# Patient Record
Sex: Male | Born: 1965 | Race: White | Hispanic: Yes | Marital: Married | Smoking: Former smoker
Health system: Southern US, Community
[De-identification: ages and names within clinical notes are randomized; demographics above are authoritative.]

## PROBLEM LIST (undated history)

## (undated) DIAGNOSIS — I10 Essential (primary) hypertension: Secondary | ICD-10-CM

## (undated) DIAGNOSIS — E7800 Pure hypercholesterolemia, unspecified: Secondary | ICD-10-CM

## (undated) DIAGNOSIS — M1990 Unspecified osteoarthritis, unspecified site: Secondary | ICD-10-CM

## (undated) DIAGNOSIS — F1010 Alcohol abuse, uncomplicated: Secondary | ICD-10-CM

## (undated) DIAGNOSIS — K219 Gastro-esophageal reflux disease without esophagitis: Secondary | ICD-10-CM

## (undated) DIAGNOSIS — G8929 Other chronic pain: Secondary | ICD-10-CM

## (undated) DIAGNOSIS — R42 Dizziness and giddiness: Secondary | ICD-10-CM

## (undated) DIAGNOSIS — R7303 Prediabetes: Secondary | ICD-10-CM

## (undated) DIAGNOSIS — F419 Anxiety disorder, unspecified: Secondary | ICD-10-CM

## (undated) DIAGNOSIS — F1410 Cocaine abuse, uncomplicated: Secondary | ICD-10-CM

## (undated) DIAGNOSIS — M542 Cervicalgia: Secondary | ICD-10-CM

## (undated) HISTORY — DX: Prediabetes: R73.03

## (undated) HISTORY — PX: EYE SURGERY: SHX253

## (undated) HISTORY — DX: Gastro-esophageal reflux disease without esophagitis: K21.9

## (undated) HISTORY — DX: Unspecified osteoarthritis, unspecified site: M19.90

---

## 2007-11-03 ENCOUNTER — Emergency Department (HOSPITAL_COMMUNITY): Admission: EM | Admit: 2007-11-03 | Discharge: 2007-11-03 | Payer: Self-pay | Admitting: Emergency Medicine

## 2008-02-10 ENCOUNTER — Emergency Department (HOSPITAL_COMMUNITY): Admission: EM | Admit: 2008-02-10 | Discharge: 2008-02-10 | Payer: Self-pay | Admitting: Emergency Medicine

## 2008-11-09 ENCOUNTER — Observation Stay (HOSPITAL_COMMUNITY): Admission: EM | Admit: 2008-11-09 | Discharge: 2008-11-09 | Payer: Self-pay | Admitting: Emergency Medicine

## 2008-11-22 ENCOUNTER — Emergency Department (HOSPITAL_COMMUNITY): Admission: EM | Admit: 2008-11-22 | Discharge: 2008-11-22 | Payer: Self-pay | Admitting: Emergency Medicine

## 2008-11-23 ENCOUNTER — Ambulatory Visit: Payer: Self-pay | Admitting: Family Medicine

## 2009-12-19 ENCOUNTER — Emergency Department (HOSPITAL_COMMUNITY): Admission: EM | Admit: 2009-12-19 | Discharge: 2009-12-19 | Payer: Self-pay | Admitting: Emergency Medicine

## 2010-03-16 ENCOUNTER — Emergency Department (HOSPITAL_COMMUNITY): Admission: EM | Admit: 2010-03-16 | Discharge: 2010-03-16 | Payer: Self-pay | Admitting: Emergency Medicine

## 2010-06-16 ENCOUNTER — Emergency Department (HOSPITAL_COMMUNITY): Admission: EM | Admit: 2010-06-16 | Discharge: 2010-06-16 | Payer: Self-pay | Admitting: Emergency Medicine

## 2010-11-24 LAB — POCT I-STAT, CHEM 8
BUN: 12 mg/dL (ref 6–23)
Calcium, Ion: 1.18 mmol/L (ref 1.12–1.32)
Chloride: 107 mEq/L (ref 96–112)
Creatinine, Ser: 0.8 mg/dL (ref 0.4–1.5)
Glucose, Bld: 106 mg/dL — ABNORMAL HIGH (ref 70–99)
HCT: 44 % (ref 39.0–52.0)
Hemoglobin: 15 g/dL (ref 13.0–17.0)
Potassium: 4 mEq/L (ref 3.5–5.1)
Sodium: 139 mEq/L (ref 135–145)
TCO2: 24 mmol/L (ref 0–100)

## 2010-11-24 LAB — GLUCOSE, CAPILLARY: Glucose-Capillary: 134 mg/dL — ABNORMAL HIGH (ref 70–99)

## 2010-11-27 LAB — URINE CULTURE: Colony Count: 2000

## 2010-11-27 LAB — COMPREHENSIVE METABOLIC PANEL
ALT: 29 U/L (ref 0–53)
AST: 28 U/L (ref 0–37)
Albumin: 4.2 g/dL (ref 3.5–5.2)
Alkaline Phosphatase: 69 U/L (ref 39–117)
BUN: 10 mg/dL (ref 6–23)
CO2: 28 mEq/L (ref 19–32)
Calcium: 9.2 mg/dL (ref 8.4–10.5)
Chloride: 102 mEq/L (ref 96–112)
Creatinine, Ser: 0.9 mg/dL (ref 0.4–1.5)
GFR calc Af Amer: >60 mL/min (ref >60)
GFR calc non Af Amer: >60 mL/min (ref >60)
Glucose, Bld: 102 mg/dL — ABNORMAL HIGH (ref 70–99)
Potassium: 4.1 mEq/L (ref 3.5–5.1)
Sodium: 136 mEq/L (ref 135–145)
Total Bilirubin: 0.5 mg/dL (ref 0.3–1.2)
Total Protein: 7.9 g/dL (ref 6.0–8.3)

## 2010-11-27 LAB — D-DIMER, QUANTITATIVE: D-Dimer, Quant: <0.22 ug/mL-FEU (ref 0.00–0.48)

## 2010-11-27 LAB — POCT CARDIAC MARKERS
CKMB, poc: <1 ng/mL — ABNORMAL LOW (ref 1.0–8.0)
CKMB, poc: <1 ng/mL — ABNORMAL LOW (ref 1.0–8.0)
Myoglobin, poc: 34.3 ng/mL (ref 12–200)
Myoglobin, poc: 43.1 ng/mL (ref 12–200)
Troponin i, poc: <0.05 ng/mL (ref 0.00–0.09)
Troponin i, poc: <0.05 ng/mL (ref 0.00–0.09)

## 2010-11-27 LAB — RAPID URINE DRUG SCREEN, HOSP PERFORMED
Amphetamines: NOT DETECTED
Barbiturates: NOT DETECTED
Benzodiazepines: NOT DETECTED
Cocaine: NOT DETECTED
Opiates: NOT DETECTED
Tetrahydrocannabinol: NOT DETECTED

## 2010-11-27 LAB — CBC
HCT: 42.9 % (ref 39.0–52.0)
Hemoglobin: 14.7 g/dL (ref 13.0–17.0)
MCH: 30.5 pg (ref 26.0–34.0)
MCHC: 34.2 g/dL (ref 30.0–36.0)
MCV: 89.2 fL (ref 78.0–100.0)
Platelets: 218 10*3/uL (ref 150–400)
RBC: 4.81 MIL/uL (ref 4.22–5.81)
RDW: 13.2 % (ref 11.5–15.5)
WBC: 4.9 10*3/uL (ref 4.0–10.5)

## 2010-11-27 LAB — URINALYSIS, ROUTINE W REFLEX MICROSCOPIC
Bilirubin Urine: NEGATIVE
Glucose, UA: NEGATIVE mg/dL
Hgb urine dipstick: NEGATIVE
Ketones, ur: NEGATIVE mg/dL
Nitrite: NEGATIVE
Protein, ur: NEGATIVE mg/dL
Specific Gravity, Urine: 1.009 (ref 1.005–1.030)
Urobilinogen, UA: 0.2 mg/dL (ref 0.0–1.0)
pH: 6 (ref 5.0–8.0)

## 2010-11-27 LAB — DIFFERENTIAL
Basophils Absolute: 0 10*3/uL (ref 0.0–0.1)
Basophils Relative: 0 % (ref 0–1)
Eosinophils Absolute: 0.1 10*3/uL (ref 0.0–0.7)
Eosinophils Relative: 3 % (ref 0–5)
Lymphocytes Relative: 29 % (ref 12–46)
Lymphs Abs: 1.4 10*3/uL (ref 0.7–4.0)
Monocytes Absolute: 0.3 10*3/uL (ref 0.1–1.0)
Monocytes Relative: 7 % (ref 3–12)
Neutro Abs: 3 10*3/uL (ref 1.7–7.7)
Neutrophils Relative %: 61 % (ref 43–77)

## 2010-11-27 LAB — LIPASE, BLOOD: Lipase: 27 U/L (ref 11–59)

## 2010-11-27 LAB — ETHANOL: Alcohol, Ethyl (B): <5 mg/dL (ref 0–10)

## 2010-12-05 ENCOUNTER — Emergency Department (HOSPITAL_COMMUNITY)
Admission: EM | Admit: 2010-12-05 | Discharge: 2010-12-05 | Disposition: A | Discharge status: Home / Self Care | Payer: Self-pay | Attending: Emergency Medicine | Admitting: Emergency Medicine

## 2010-12-05 DIAGNOSIS — H538 Other visual disturbances: Secondary | ICD-10-CM | POA: Insufficient documentation

## 2010-12-05 DIAGNOSIS — H5710 Ocular pain, unspecified eye: Secondary | ICD-10-CM | POA: Insufficient documentation

## 2010-12-05 DIAGNOSIS — S058X9A Other injuries of unspecified eye and orbit, initial encounter: Secondary | ICD-10-CM | POA: Insufficient documentation

## 2010-12-05 DIAGNOSIS — W208XXA Other cause of strike by thrown, projected or falling object, initial encounter: Secondary | ICD-10-CM | POA: Insufficient documentation

## 2010-12-09 ENCOUNTER — Emergency Department (HOSPITAL_COMMUNITY)
Admission: EM | Admit: 2010-12-09 | Discharge: 2010-12-09 | Disposition: A | Discharge status: Home / Self Care | Payer: Self-pay | Attending: Emergency Medicine | Admitting: Emergency Medicine

## 2010-12-09 DIAGNOSIS — H5789 Other specified disorders of eye and adnexa: Secondary | ICD-10-CM | POA: Insufficient documentation

## 2010-12-09 DIAGNOSIS — L2989 Other pruritus: Secondary | ICD-10-CM | POA: Insufficient documentation

## 2010-12-09 DIAGNOSIS — L255 Unspecified contact dermatitis due to plants, except food: Secondary | ICD-10-CM | POA: Insufficient documentation

## 2010-12-09 DIAGNOSIS — L298 Other pruritus: Secondary | ICD-10-CM | POA: Insufficient documentation

## 2010-12-09 DIAGNOSIS — T622X1A Toxic effect of other ingested (parts of) plant(s), accidental (unintentional), initial encounter: Secondary | ICD-10-CM | POA: Insufficient documentation

## 2010-12-22 LAB — CARDIAC PANEL(CRET KIN+CKTOT+MB+TROPI)
CK, MB: 1.5 ng/mL (ref 0.3–4.0)
Relative Index: 1.2 (ref 0.0–2.5)
Total CK: 129 U/L (ref 7–232)
Troponin I: <0.01 ng/mL (ref 0.00–0.06)

## 2010-12-22 LAB — TSH: TSH: 1.886 u[IU]/mL (ref 0.350–4.500)

## 2010-12-22 LAB — CK TOTAL AND CKMB (NOT AT ARMC)
CK, MB: 1.9 ng/mL (ref 0.3–4.0)
Relative Index: 1.1 (ref 0.0–2.5)
Total CK: 169 U/L (ref 7–232)

## 2010-12-22 LAB — TROPONIN I: Troponin I: <0.01 ng/mL (ref 0.00–0.06)

## 2010-12-23 ENCOUNTER — Emergency Department (INDEPENDENT_AMBULATORY_CARE_PROVIDER_SITE_OTHER): Payer: Self-pay

## 2010-12-23 ENCOUNTER — Emergency Department (HOSPITAL_BASED_OUTPATIENT_CLINIC_OR_DEPARTMENT_OTHER)
Admission: EM | Admit: 2010-12-23 | Discharge: 2010-12-23 | Disposition: A | Discharge status: Home / Self Care | Payer: Self-pay | Attending: Emergency Medicine | Admitting: Emergency Medicine

## 2010-12-23 DIAGNOSIS — R05 Cough: Secondary | ICD-10-CM

## 2010-12-23 DIAGNOSIS — J309 Allergic rhinitis, unspecified: Secondary | ICD-10-CM | POA: Insufficient documentation

## 2010-12-23 DIAGNOSIS — F410 Panic disorder [episodic paroxysmal anxiety] without agoraphobia: Secondary | ICD-10-CM | POA: Insufficient documentation

## 2010-12-23 DIAGNOSIS — R002 Palpitations: Secondary | ICD-10-CM | POA: Insufficient documentation

## 2010-12-23 DIAGNOSIS — I10 Essential (primary) hypertension: Secondary | ICD-10-CM | POA: Insufficient documentation

## 2010-12-23 DIAGNOSIS — R059 Cough, unspecified: Secondary | ICD-10-CM

## 2010-12-27 LAB — POCT CARDIAC MARKERS
CKMB, poc: 1.1 ng/mL (ref 1.0–8.0)
CKMB, poc: 1.6 ng/mL (ref 1.0–8.0)
CKMB, poc: <1 ng/mL — ABNORMAL LOW (ref 1.0–8.0)
Myoglobin, poc: 28.2 ng/mL (ref 12–200)
Myoglobin, poc: 44.6 ng/mL (ref 12–200)
Myoglobin, poc: 66.6 ng/mL (ref 12–200)
Troponin i, poc: <0.05 ng/mL (ref 0.00–0.09)
Troponin i, poc: <0.05 ng/mL (ref 0.00–0.09)
Troponin i, poc: <0.05 ng/mL (ref 0.00–0.09)

## 2010-12-27 LAB — CBC
HCT: 40.9 % (ref 39.0–52.0)
Hemoglobin: 14.2 g/dL (ref 13.0–17.0)
MCHC: 34.8 g/dL (ref 30.0–36.0)
MCV: 87.3 fL (ref 78.0–100.0)
Platelets: 273 10*3/uL (ref 150–400)
RBC: 4.68 MIL/uL (ref 4.22–5.81)
RDW: 13.2 % (ref 11.5–15.5)
WBC: 6.9 10*3/uL (ref 4.0–10.5)

## 2010-12-27 LAB — COMPREHENSIVE METABOLIC PANEL
ALT: 32 U/L (ref 0–53)
AST: 40 U/L — ABNORMAL HIGH (ref 0–37)
Albumin: 4.1 g/dL (ref 3.5–5.2)
Alkaline Phosphatase: 83 U/L (ref 39–117)
BUN: 9 mg/dL (ref 6–23)
CO2: 24 mEq/L (ref 19–32)
Calcium: 9.1 mg/dL (ref 8.4–10.5)
Chloride: 99 mEq/L (ref 96–112)
Creatinine, Ser: 0.95 mg/dL (ref 0.4–1.5)
GFR calc Af Amer: >60 mL/min (ref >60)
GFR calc non Af Amer: >60 mL/min (ref >60)
Glucose, Bld: 108 mg/dL — ABNORMAL HIGH (ref 70–99)
Potassium: 3.6 mEq/L (ref 3.5–5.1)
Sodium: 133 mEq/L — ABNORMAL LOW (ref 135–145)
Total Bilirubin: 0.7 mg/dL (ref 0.3–1.2)
Total Protein: 7.8 g/dL (ref 6.0–8.3)

## 2010-12-27 LAB — URINALYSIS, ROUTINE W REFLEX MICROSCOPIC
Bilirubin Urine: NEGATIVE
Glucose, UA: NEGATIVE mg/dL
Hgb urine dipstick: NEGATIVE
Ketones, ur: NEGATIVE mg/dL
Nitrite: NEGATIVE
Protein, ur: NEGATIVE mg/dL
Specific Gravity, Urine: 1.014 (ref 1.005–1.030)
Urobilinogen, UA: 0.2 mg/dL (ref 0.0–1.0)
pH: 6.5 (ref 5.0–8.0)

## 2010-12-27 LAB — DIFFERENTIAL
Basophils Absolute: 0 10*3/uL (ref 0.0–0.1)
Basophils Relative: 1 % (ref 0–1)
Eosinophils Absolute: 0.2 10*3/uL (ref 0.0–0.7)
Eosinophils Relative: 3 % (ref 0–5)
Lymphocytes Relative: 49 % — ABNORMAL HIGH (ref 12–46)
Lymphs Abs: 3.4 10*3/uL (ref 0.7–4.0)
Monocytes Absolute: 0.5 10*3/uL (ref 0.1–1.0)
Monocytes Relative: 7 % (ref 3–12)
Neutro Abs: 2.7 10*3/uL (ref 1.7–7.7)
Neutrophils Relative %: 40 % — ABNORMAL LOW (ref 43–77)

## 2010-12-27 LAB — RAPID URINE DRUG SCREEN, HOSP PERFORMED
Amphetamines: NOT DETECTED
Barbiturates: NOT DETECTED
Benzodiazepines: NOT DETECTED
Cocaine: NOT DETECTED
Opiates: NOT DETECTED
Tetrahydrocannabinol: NOT DETECTED

## 2010-12-27 LAB — ETHANOL: Alcohol, Ethyl (B): <5 mg/dL (ref 0–10)

## 2011-01-24 NOTE — Discharge Summary (Signed)
NAMESAGE, KOPERA                ACCOUNT NO.:  [id number]   MEDICAL RECORD NO.:  [id number]          PATIENT TYPE:  INP   LOCATION:  3731                         FACILITY:  [facility]   PHYSICIAN:  Marcellus Scott, MD     DATE OF BIRTH:  05-16-1966   DATE OF ADMISSION:  11/08/2008  DATE OF DISCHARGE:  11/09/2008                               DISCHARGE SUMMARY   PRIMARY MEDICAL DOCTOR:  Gentry Fitz.   DISCHARGE DIAGNOSES:  1. Atypical chest pain, resolved.  2. Hypertension, controlled off medications.  3. Tobacco abuse.  4. Alcohol abuse.   DISCHARGE MEDICATION:  Multivitamins 1 tablet p.o. daily.   PROCEDURES:  1. CT of the head without contrast - impression:  No acute      intracranial abnormality.  2. Chest x-ray  - impression:  No acute disease.  3. EEG:  Report pending.   PERTINENT LABORATORY DATA:  Cardiac panel cycled and negative.  Urine  drug screen is negative.  Blood alcohol level less than 5.  Comprehensive metabolic panel:  Sodium 133, BUN 9, creatinine 0.95, AST  40, ALT 32.  CBCs with hemoglobin 14.2, hematocrit 40.9, white blood  cell 6.9, platelets 273.  Urinalysis negative.   EKG:  Normal sinus rhythm at 83 beats per minute, no acute changes.   CONSULTATIONS:  None.   HOSPITAL COURSE AND PATIENT DISPOSITION:  Mr. Wade is a pleasant 45-  year-old Hispanic male patient with history of hypertension, not on any  medications, tobacco and alcohol abuse who was in his usual state of  health until yesterday morning.  The patient speaks a fair amount of  English to be able to indicate his complaints.  He was doing some work  at home on his home furniture.  At approximately 11:00 a.m., he  experienced mild lower substernal, dull-aching, nonradiating, 4/10 chest  pain.  He denies heavy physical exertion.  There was no worsening of his  chest pain by chest wall movements or deep inspiration..  With this, he  also had left-sided back pain.  He thought all of this was  secondary to  muscular strain.  In about 45 minutes, the patient's symptoms  spontaneously subsided.  He denied any dyspnea at that point.  Later  during the afternoon at about 6:00 p.m., the patient had some dry mouth  and difficulty breathing for which he came to the emergency room.  He  was admitted to the hospital for further evaluation and management.  There was no radiation of the chest pain to the left upper extremity.  The patient, however, complains of intermittent sleeping of his left  forearm.  He denies any headache.  He says he had some nervousness on  the back of the head.  There are no speech difficulties.  The patient  has no history of long-distance travel.  The patient indicates that  since yesterday morning he has not had any further chest discomfort.  His left upper extremity numbness as well as the nervousness in the back  of the head has resolved.  Currently, he is asymptomatic.  He indicates  that he drinks about 4 beers every day and on weekends 6-7 beers daily.  He denies use of hard liquor.  He claims he smokes 2-3 cigarettes per  week.  He denies substance abuse.  The patient was admitted to  telemetry.  His cardiac enzymes were cycled and negative.  EKG was  essentially without acute changes.   The patient was admitted and placed on aspirin, nitroglycerin patch.  At  this time, we think that this chest discomfort was probably  musculoskeletal in nature.  The patient denies any family history of  coronary artery disease.  His vital signs are stable.   There was some history off abnormal involuntary movements. On further  discussion, these do not seem seizure like activity. CT head is negative  and will follow EEG.   PHYSICAL EXAMINATION:  Unremarkable and workup thus far negative.  The  patient has been counseled regarding cessation of tobacco as well as  moderation of alcohol.  He verbalizes understanding.  He indicates he  does not have a primary care  physician or insurance.  Will refer to  [facility] where he is to call for an appointment.  He has  been advised to seek immediate medical attention if he has any  recurrence or worsening of symptoms at which point further workup can be  elicited.      Marcellus Scott, MD  Electronically Signed     AH/MEDQ  D:  11/09/2008  T:  11/09/2008  Job:  [id number]   cc:   Melvern Banker

## 2011-01-24 NOTE — H&P (Signed)
Wesley [name], Wesley [name]                ACCOUNT NO.:  [id number]   MEDICAL RECORD NO.:  [id number]          PATIENT TYPE:  INP   LOCATION:  3731                         FACILITY:  [facility]   PHYSICIAN:  Della Goo, M.D. DATE OF BIRTH:  1966-05-02   DATE OF ADMISSION:  11/08/2008  DATE OF DISCHARGE:                              HISTORY & PHYSICAL   PRIMARY CARE PHYSICIAN:  None.   CHIEF COMPLAINT:  Chest pain.   HISTORY OF PRESENT ILLNESS:  This is a 45 year old Hispanic male who  presents to the emergency department.  The history and physical has been  obtained with the assistance of his significant other, who is at the  bedside.  They report that the patient has had chest pain off and on for  awhile and it returned in the evening on November 08, 2008, and this  pain was described as being substernal in the chest and radiating into  the left arm.  The patient reportedly has also had left arm numbness off  and on.  There are conflicts in the history.  My interview of the  patient and the significant other, the patient stated that the pain  lasted for a few seconds.  However, the interview with the emergency  department physician, the patient had stated that the chest pain had  lasted 30 minutes.  However, he does report that he has had chest pain  off and on and it returned and it was worse today and rated the pain as  being a 7/10 in intensity.  He does report having shortness of breath  associated with this episode.  He denies having any diaphoresis, nausea  or vomiting.  The patient reports prior to having this episode of chest  pain, he was having a severe headache and felt like his brain was  shaking.  The patient also reports that he has also been having episodes  of shaking all over, which his significant other describes as being  convulsions, prior to going to sleep at times.  They both report that  this has been happening off and on for the past few months.   PAST MEDICAL  HISTORY:  None.   MEDICATIONS:  None.   ALLERGIES:  No known drug allergies.   PAST SURGICAL HISTORY:  None.   SOCIAL HISTORY:  The patient is a nonsmoker.  He does report drinking 4  to 5 beers daily but reports not drinking on the evening of the episode  of his chest pain.   FAMILY HISTORY:  Negative for coronary artery disease, hypertension or  diabetes.  His father and his paternal uncle did have cerebrovascular  accidents.  There is no history of cancer in the family.   REVIEW OF SYSTEMS:  Pertinents are mentioned above.   PHYSICAL EXAMINATION FINDINGS:  This is a 45 year old well-nourished,  well-developed male in no visible discomfort or acute distress.  VITAL SIGNS:  Temperature 97.9 blood pressure 171/90 initially.  Recheck  a few hours later revealed improvement in his blood pressures to 143/80  and then 136/73.  His heart rate initially was 112,  now it is 103, and  the respirations 421.  The O2 saturations were 100% on room air.  HEENT:  Normocephalic, atraumatic.  There is no scleral icterus.  Pupils  equally round, reactive to light.  Extraocular movements are intact.  Funduscopic benign.  Oropharynx is clear.  NECK:  Supple, full range of motion.  No thyromegaly, adenopathy or  jugular venous distention.  CARDIOVASCULAR:  Regular rate and rhythm.  No murmurs, gallops or rubs.  LUNGS: Clear to auscultation bilaterally.  ABDOMEN:  Positive bowel sounds, soft, nontender, nondistended.  EXTREMITIES:  Without cyanosis, clubbing or edema.  NEUROLOGIC:  Examination nonfocal.   LABORATORY STUDIES:  White blood cell count 6.9, hemoglobin 14.2,  hematocrit 40.9, platelets 273, neutrophils 40% and lymphocytes 49%.  Sodium 133, potassium 3.6, chloride 99, bicarb 24, BUN 9, creatinine  0.95 and glucose 108.  Urinalysis:  Negative.  Point of care cardiac  markers with a myoglobin of 66.6, CK-MB 1.6, troponin less than 0.05.  Second set of point of care markers revealed a  myoglobin of 28.2, a CK-  MB of less than 1.0 and a troponin of less than 0.05.  Chest x-ray  reveals no acute disease process.  Urinalysis is negative.  Urine drug  screen is negative.  An EKG does reveal a normal sinus rhythm.   ASSESSMENT:  A 45 year old male being admitted with:  1. Chest pain.  2  Periodic questionable seizure activity.  1. Elevated blood pressure, which is probably reactive.  2. Headache.   PLAN:  The patient will be admitted to a telemetry area for cardiac  monitoring.  A cardiac workup will be started with enzymes and the  patient will be placed on therapy of nitrates, oxygen and aspirin  therapy for now.  DVT and GI prophylaxis have also been ordered.  The  patient will also have a CT scan of the brain performed to evaluate his  headaches and began a workup in regard to the questionable seizure  activity.  An EEG study will also be ordered.  Further workup will ensue  pending results of his studies and his clinical course.      Della Goo, M.D.  Electronically Signed     HJ/MEDQ  D:  11/09/2008  T:  11/09/2008  Job:  [id number]

## 2011-01-24 NOTE — Procedures (Signed)
EEG NUMBER:   HISTORY:  This is a 45 year old gentleman who was admitted for chest  pain, alcohol abuse, episodes of shaking all over the prior to  admission.  The patient is being evaluated for seizures.   This is a routine EEG.  No skull defects were noted.   Medications include Lovenox, Zofran, aspirin, Protonix, oxycodone,  Dilaudid, nicotine patch.   EEG CLASSIFICATION:  Essentially normal awake.   DESCRIPTION OF THE RECORDING:  The background rhythm of this recording  consists of a somewhat low-amplitude alpha rhythm of 10 Hz that is  reactive to eye opening and closure.  As the record progresses, the  patient appears to remain in the waking state during the recording.  Photic stimulation is performed resulting in a minimal bilateral photic  drive response.  Hyperventilation is also performed resulting in a  minimal buildup of background activities without significant slowing  seen.  At no time during the recording does there appear to be evidence  of spike, spike wave discharges, or evidence of focal slowing.  EKG  monitor shows no evidence of cardiac rhythm abnormalities with a heart  rate of 78.   IMPRESSION:  This is a an essentially normal EEG recording in the waking  state.  No evidence of ictal or interictal discharges were seen.      Marlan Palau, M.D.  Electronically Signed     ZOX:WRUE  D:  11/09/2008 14:16:30  T:  11/10/2008 04:32:10  Job #:  [id number]

## 2011-02-21 ENCOUNTER — Emergency Department (HOSPITAL_COMMUNITY)
Admission: EM | Admit: 2011-02-21 | Discharge: 2011-02-21 | Disposition: A | Discharge status: Home / Self Care | Payer: Self-pay | Attending: Emergency Medicine | Admitting: Emergency Medicine

## 2011-02-21 DIAGNOSIS — L255 Unspecified contact dermatitis due to plants, except food: Secondary | ICD-10-CM | POA: Insufficient documentation

## 2011-02-21 DIAGNOSIS — T622X1A Toxic effect of other ingested (parts of) plant(s), accidental (unintentional), initial encounter: Secondary | ICD-10-CM | POA: Insufficient documentation

## 2011-02-21 DIAGNOSIS — H02849 Edema of unspecified eye, unspecified eyelid: Secondary | ICD-10-CM | POA: Insufficient documentation

## 2011-02-21 DIAGNOSIS — I10 Essential (primary) hypertension: Secondary | ICD-10-CM | POA: Insufficient documentation

## 2011-02-21 DIAGNOSIS — L299 Pruritus, unspecified: Secondary | ICD-10-CM | POA: Insufficient documentation

## 2011-06-05 LAB — POCT I-STAT CREATININE
Creatinine, Ser: 1.1
Operator id: 265201

## 2011-06-05 LAB — I-STAT 8, (EC8 V) (CONVERTED LAB)
Acid-Base Excess: 4 — ABNORMAL HIGH
BUN: 7
Bicarbonate: 29.3 — ABNORMAL HIGH
Chloride: 103
Glucose, Bld: 94
HCT: 47
Hemoglobin: 16
Operator id: 265201
Potassium: 4.1
Sodium: 135
TCO2: 31
pCO2, Ven: 44.2 — ABNORMAL LOW
pH, Ven: 7.429 — ABNORMAL HIGH

## 2011-06-05 LAB — DIFFERENTIAL
Basophils Absolute: 0
Basophils Relative: 0
Eosinophils Absolute: 0.1
Eosinophils Relative: 1
Lymphocytes Relative: 9 — ABNORMAL LOW
Lymphs Abs: 0.7
Monocytes Absolute: 0.8
Monocytes Relative: 10
Neutro Abs: 6.1
Neutrophils Relative %: 80 — ABNORMAL HIGH

## 2011-06-05 LAB — CBC
HCT: 41.3
Hemoglobin: 14.2
MCHC: 34.4
MCV: 84.8
Platelets: 265
RBC: 4.87
RDW: 14
WBC: 7.6

## 2011-09-21 ENCOUNTER — Emergency Department (HOSPITAL_COMMUNITY)
Admission: EM | Admit: 2011-09-21 | Discharge: 2011-09-21 | Disposition: A | Discharge status: Home / Self Care | Payer: Self-pay | Attending: Emergency Medicine | Admitting: Emergency Medicine

## 2011-09-21 ENCOUNTER — Encounter (HOSPITAL_COMMUNITY): Payer: Self-pay | Admitting: *Deleted

## 2011-09-21 DIAGNOSIS — R6883 Chills (without fever): Secondary | ICD-10-CM | POA: Insufficient documentation

## 2011-09-21 DIAGNOSIS — R42 Dizziness and giddiness: Secondary | ICD-10-CM | POA: Insufficient documentation

## 2011-09-21 DIAGNOSIS — R5381 Other malaise: Secondary | ICD-10-CM | POA: Insufficient documentation

## 2011-09-21 DIAGNOSIS — M25519 Pain in unspecified shoulder: Secondary | ICD-10-CM | POA: Insufficient documentation

## 2011-09-21 DIAGNOSIS — R5383 Other fatigue: Secondary | ICD-10-CM | POA: Insufficient documentation

## 2011-09-21 DIAGNOSIS — R000 Tachycardia, unspecified: Secondary | ICD-10-CM | POA: Insufficient documentation

## 2011-09-21 DIAGNOSIS — M549 Dorsalgia, unspecified: Secondary | ICD-10-CM | POA: Insufficient documentation

## 2011-09-21 LAB — DIFFERENTIAL
Basophils Absolute: 0 10*3/uL (ref 0.0–0.1)
Basophils Relative: 0 % (ref 0–1)
Eosinophils Absolute: 0.2 10*3/uL (ref 0.0–0.7)
Eosinophils Relative: 4 % (ref 0–5)
Lymphocytes Relative: 48 % — ABNORMAL HIGH (ref 12–46)
Lymphs Abs: 2.7 10*3/uL (ref 0.7–4.0)
Monocytes Absolute: 0.4 10*3/uL (ref 0.1–1.0)
Monocytes Relative: 7 % (ref 3–12)
Neutro Abs: 2.3 10*3/uL (ref 1.7–7.7)
Neutrophils Relative %: 41 % — ABNORMAL LOW (ref 43–77)

## 2011-09-21 LAB — CBC
HCT: 41.8 % (ref 39.0–52.0)
Hemoglobin: 14.3 g/dL (ref 13.0–17.0)
MCH: 29.8 pg (ref 26.0–34.0)
MCHC: 34.2 g/dL (ref 30.0–36.0)
MCV: 87.1 fL (ref 78.0–100.0)
Platelets: 219 10*3/uL (ref 150–400)
RBC: 4.8 MIL/uL (ref 4.22–5.81)
RDW: 12.7 % (ref 11.5–15.5)
WBC: 5.6 10*3/uL (ref 4.0–10.5)

## 2011-09-21 LAB — COMPREHENSIVE METABOLIC PANEL
ALT: 30 U/L (ref 0–53)
AST: 24 U/L (ref 0–37)
Albumin: 3.8 g/dL (ref 3.5–5.2)
Alkaline Phosphatase: 74 U/L (ref 39–117)
BUN: 9 mg/dL (ref 6–23)
CO2: 26 mEq/L (ref 19–32)
Calcium: 9.2 mg/dL (ref 8.4–10.5)
Chloride: 103 mEq/L (ref 96–112)
Creatinine, Ser: 0.79 mg/dL (ref 0.50–1.35)
GFR calc Af Amer: >90 mL/min (ref >90)
GFR calc non Af Amer: >90 mL/min (ref >90)
Glucose, Bld: 91 mg/dL (ref 70–99)
Potassium: 3.3 mEq/L — ABNORMAL LOW (ref 3.5–5.1)
Sodium: 140 mEq/L (ref 135–145)
Total Bilirubin: 0.3 mg/dL (ref 0.3–1.2)
Total Protein: 7.6 g/dL (ref 6.0–8.3)

## 2011-09-21 LAB — POCT I-STAT, CHEM 8
BUN: 8 mg/dL (ref 6–23)
Calcium, Ion: 1.15 mmol/L (ref 1.12–1.32)
Chloride: 106 mEq/L (ref 96–112)
Creatinine, Ser: 0.9 mg/dL (ref 0.50–1.35)
Glucose, Bld: 102 mg/dL — ABNORMAL HIGH (ref 70–99)
HCT: 44 % (ref 39.0–52.0)
Hemoglobin: 15 g/dL (ref 13.0–17.0)
Potassium: 3.5 mEq/L (ref 3.5–5.1)
Sodium: 142 mEq/L (ref 135–145)
TCO2: 25 mmol/L (ref 0–100)

## 2011-09-21 MED ORDER — KETOROLAC TROMETHAMINE 30 MG/ML IJ SOLN
30.0000 mg | Freq: Once | INTRAMUSCULAR | Status: DC
  Filled 2011-09-21: qty 1

## 2011-09-21 MED ORDER — SODIUM CHLORIDE 0.9 % IV BOLUS (SEPSIS)
1000.0000 mL | Freq: Once | INTRAVENOUS | Status: AC
  Administered 2011-09-21: 1000 mL via INTRAVENOUS

## 2011-09-21 NOTE — ED Provider Notes (Cosign Needed)
History     CSN: [id number]  Arrival date & time 09/21/11  4098   First MD Initiated Contact with Patient 09/21/11 0720      Chief Complaint  Patient presents with  . Dizziness  . Weakness    (Consider location/radiation/quality/duration/timing/severity/associated sxs/prior treatment) Patient is a 46 y.o. male presenting with weakness. The history is provided by the patient.  Weakness Primary symptoms do not include headaches, fever, nausea or vomiting.  Additional symptoms include weakness.   the patient is a 46 year old, male, with no significant past medical history.  He does not drink alcohol or smoke cigarettes.  He states that he woke up today, and he had pain across his shoulders, as well as a shaking chill.  He denies nausea, vomiting, fevers, sweating, cough, shortness of breath, diarrhea.  He has not had this before.  He has not been sick recently.  He does not feel cold at this time.  He does still have mild pain across the shoulders.  He says that 2 days ago.  He had pain in his right upper quadrant, which has resolved  History reviewed. No pertinent past medical history.  Past Surgical History  Procedure Date  . Eye surgery     Family History  Problem Relation Age of Onset  . Hypertension Mother   . Hypertension Father     History  Substance Use Topics  . Smoking status: Never Smoker   . Smokeless tobacco: Not on file  . Alcohol Use: Yes     daily      Review of Systems  Constitutional: Positive for chills. Negative for fever and diaphoresis.  HENT: Negative for neck pain.   Respiratory: Negative for cough and shortness of breath.   Cardiovascular: Negative for chest pain.  Gastrointestinal: Negative for nausea, vomiting, abdominal pain and diarrhea.  Musculoskeletal: Positive for back pain.       Pain across the back of his shoulders  Neurological: Positive for weakness. Negative for headaches.  Psychiatric/Behavioral: Negative for confusion.  All  other systems reviewed and are negative.    Allergies  Review of patient's allergies indicates no known allergies.  Home Medications  No current outpatient prescriptions on file.  BP 131/85  Temp(Src) 99.1 F (37.3 C) (Oral)  Resp 20  SpO2 99%  Physical Exam  Vitals reviewed. Constitutional: He is oriented to person, place, and time. He appears well-developed and well-nourished. No distress.  HENT:  Head: Normocephalic and atraumatic.  Eyes: EOM are normal. Pupils are equal, round, and reactive to light.  Neck: Normal range of motion. Neck supple.  Cardiovascular: Regular rhythm and normal heart sounds.   No murmur heard.      Tachycardia  Pulmonary/Chest: Effort normal and breath sounds normal. No respiratory distress. He has no wheezes. He has no rales.  Abdominal: Soft. Bowel sounds are normal. He exhibits no distension and no mass. There is no tenderness. There is no rebound and no guarding.  Musculoskeletal: Normal range of motion. He exhibits no edema and no tenderness.  Neurological: He is alert and oriented to person, place, and time. No cranial nerve deficit.  Skin: Skin is warm and dry. He is not diaphoretic.  Psychiatric: He has a normal mood and affect. His behavior is normal.    ED Course  Procedures (including critical care time) 46 year old, male, with no past medical history presented with pain across his shoulders, and a shaking chill.  Today.  The chills are resolved.  He has a mild  tachycardia.  He said he had right upper quadrant pain 2 days ago, but does not have it today, and he has a nontender.  Abdomen.  CT is tachycardic.  We will give him IV fluid bolus and perform laboratory testing.  Labs Reviewed  DIFFERENTIAL - Abnormal; Notable for the following:    Neutrophils Relative 41 (*)    Lymphocytes Relative 48 (*)    All other components within normal limits  POCT I-STAT, CHEM 8 - Abnormal; Notable for the following:    Glucose, Bld 102 (*)    All  other components within normal limits  COMPREHENSIVE METABOLIC PANEL - Abnormal; Notable for the following:    Potassium 3.3 (*)    All other components within normal limits  CBC  I-STAT, CHEM 8   No results found.   No diagnosis found.  sxs resolved   MDM  Tachycardia No evidence of significant illness.  Symptoms improved in the emergency department.  No signs of cholelithiasis or cholecystitis.        Nicholes Stairs, MD 09/21/11 [phone]

## 2011-09-21 NOTE — ED Notes (Addendum)
C/o dry throat and tongue, also chills, weakness & dizziness. "woke up shaking this am", (Denies: pain, sob, cough, congestion, cold sx or  Nvd), fever noted in triage.

## 2011-09-21 NOTE — ED Notes (Signed)
Pt. Reports feeling better and wants to have IV removed.  Explained to pt. That IV will be removed upon discharge.

## 2011-12-30 ENCOUNTER — Emergency Department (HOSPITAL_COMMUNITY): Payer: Self-pay

## 2011-12-30 ENCOUNTER — Emergency Department (HOSPITAL_COMMUNITY)
Admission: EM | Admit: 2011-12-30 | Discharge: 2011-12-30 | Disposition: A | Discharge status: Home / Self Care | Payer: Self-pay | Attending: Emergency Medicine | Admitting: Emergency Medicine

## 2011-12-30 ENCOUNTER — Encounter (HOSPITAL_COMMUNITY): Payer: Self-pay | Admitting: Emergency Medicine

## 2011-12-30 ENCOUNTER — Other Ambulatory Visit: Payer: Self-pay

## 2011-12-30 DIAGNOSIS — R1013 Epigastric pain: Secondary | ICD-10-CM | POA: Insufficient documentation

## 2011-12-30 DIAGNOSIS — K2920 Alcoholic gastritis without bleeding: Secondary | ICD-10-CM | POA: Insufficient documentation

## 2011-12-30 DIAGNOSIS — R002 Palpitations: Secondary | ICD-10-CM | POA: Insufficient documentation

## 2011-12-30 DIAGNOSIS — R079 Chest pain, unspecified: Secondary | ICD-10-CM | POA: Insufficient documentation

## 2011-12-30 DIAGNOSIS — I498 Other specified cardiac arrhythmias: Secondary | ICD-10-CM | POA: Insufficient documentation

## 2011-12-30 LAB — CBC
HCT: 42.3 % (ref 39.0–52.0)
Hemoglobin: 14.7 g/dL (ref 13.0–17.0)
MCH: 30.4 pg (ref 26.0–34.0)
MCHC: 34.8 g/dL (ref 30.0–36.0)
MCV: 87.6 fL (ref 78.0–100.0)
Platelets: 258 10*3/uL (ref 150–400)
RBC: 4.83 MIL/uL (ref 4.22–5.81)
RDW: 13.6 % (ref 11.5–15.5)
WBC: 7.1 10*3/uL (ref 4.0–10.5)

## 2011-12-30 LAB — COMPREHENSIVE METABOLIC PANEL
ALT: 27 U/L (ref 0–53)
AST: 26 U/L (ref 0–37)
Albumin: 3.8 g/dL (ref 3.5–5.2)
Alkaline Phosphatase: 78 U/L (ref 39–117)
BUN: 10 mg/dL (ref 6–23)
CO2: 25 mEq/L (ref 19–32)
Calcium: 9.1 mg/dL (ref 8.4–10.5)
Chloride: 100 mEq/L (ref 96–112)
Creatinine, Ser: 0.92 mg/dL (ref 0.50–1.35)
GFR calc Af Amer: >90 mL/min (ref >90)
GFR calc non Af Amer: >90 mL/min (ref >90)
Glucose, Bld: 99 mg/dL (ref 70–99)
Potassium: 3.6 mEq/L (ref 3.5–5.1)
Sodium: 138 mEq/L (ref 135–145)
Total Bilirubin: 0.2 mg/dL — ABNORMAL LOW (ref 0.3–1.2)
Total Protein: 7.5 g/dL (ref 6.0–8.3)

## 2011-12-30 LAB — POCT I-STAT TROPONIN I: Troponin i, poc: 0 ng/mL (ref 0.00–0.08)

## 2011-12-30 LAB — BASIC METABOLIC PANEL
BUN: 10 mg/dL (ref 6–23)
CO2: 25 mEq/L (ref 19–32)
Calcium: 9.1 mg/dL (ref 8.4–10.5)
Chloride: 99 mEq/L (ref 96–112)
Creatinine, Ser: 0.92 mg/dL (ref 0.50–1.35)
GFR calc Af Amer: >90 mL/min (ref >90)
GFR calc non Af Amer: >90 mL/min (ref >90)
Glucose, Bld: 99 mg/dL (ref 70–99)
Potassium: 3.7 mEq/L (ref 3.5–5.1)
Sodium: 137 mEq/L (ref 135–145)

## 2011-12-30 LAB — LIPASE, BLOOD: Lipase: 41 U/L (ref 11–59)

## 2011-12-30 MED ORDER — SODIUM CHLORIDE 0.9 % IV BOLUS (SEPSIS)
1000.0000 mL | Freq: Once | INTRAVENOUS | Status: AC
  Administered 2011-12-30: 1000 mL via INTRAVENOUS

## 2011-12-30 MED ORDER — OMEPRAZOLE 20 MG PO CPDR: 20.0000 mg | DELAYED_RELEASE_CAPSULE | Freq: Every day | ORAL | Status: DC

## 2011-12-30 MED ORDER — PANTOPRAZOLE SODIUM 40 MG IV SOLR
40.0000 mg | Freq: Once | INTRAVENOUS | Status: AC
  Administered 2011-12-30: 40 mg via INTRAVENOUS
  Filled 2011-12-30: qty 40

## 2011-12-30 MED ORDER — GI COCKTAIL ~~LOC~~
30.0000 mL | Freq: Once | ORAL | Status: AC
  Administered 2011-12-30: 30 mL via ORAL
  Filled 2011-12-30: qty 30

## 2011-12-30 NOTE — Discharge Instructions (Signed)
Your EKG, and blood tests do not show any signs of damage to your heart.  Stop drinking excessive amounts of alcohol.  It is probably the cause of your symptoms.  Use Prilosec to reduce acid in her stomach and reduce pain.  Followup with health service for reevaluation.  Return for worse or uncontrolled symptoms

## 2011-12-30 NOTE — ED Notes (Addendum)
Per pt, was awakened by feeling heart beating fast and felt shaky, went to go get glass of water, and started to have midsternal chest pressure a/w SOB; denies feeling dizzy, n/v; c/o his mouth feeling dry; pt shaky at triage; pt reports drinking 14-16 beers yesterday; pt's wife reports that symptoms started after having intercourse

## 2011-12-30 NOTE — ED Provider Notes (Addendum)
History     CSN: [id number]  Arrival date & time 12/30/11  0037   First MD Initiated Contact with Patient 12/30/11 0100      Chief Complaint  Patient presents with  . Chest Pain    (Consider location/radiation/quality/duration/timing/severity/associated sxs/prior treatment) Patient is a 46 y.o. male presenting with chest pain. The history is provided by the patient and the spouse.  Chest Pain Primary symptoms include palpitations and abdominal pain. Pertinent negatives for primary symptoms include no fever, no shortness of breath, no cough, no nausea and no vomiting.  The palpitations did not occur with shortness of breath.     the patient is a 46 year old, male, with no past medical history, who presents to emergency department complaining of epigastric pain with a pounding heartbeat and polydipsia.  He states that he drinks alcohol daily.  He was sleeping and he woke up with a pounding heartbeat.  He got up and noticed that his mouth was very dry as well.  He denies pain at this time.  He denies palpitations.  At this time.  He denies nausea, vomiting, fevers, chills, cough, shortness of breath, or sweating.  He has not been sick recently.  He does not smoke cigarettes.  His wife states that this occurs occasionally.  Both when he is drinking and when he is not drinking.  History reviewed. No pertinent past medical history.  History reviewed. No pertinent past surgical history.  Family History  Problem Relation Age of Onset  . Hypertension Mother   . Hypertension Father     History  Substance Use Topics  . Smoking status: Never Smoker   . Smokeless tobacco: Not on file  . Alcohol Use: Yes     daily- about 5 beers a day      Review of Systems  Constitutional: Negative for fever and chills.  Respiratory: Negative for cough, chest tightness and shortness of breath.   Cardiovascular: Positive for chest pain and palpitations.       Chest pain, is really epigastric  discomfort  Gastrointestinal: Positive for abdominal pain. Negative for nausea and vomiting.       Epigastric pain  Neurological: Negative for headaches.  Psychiatric/Behavioral: Negative for confusion.  All other systems reviewed and are negative.    Allergies  Review of patient's allergies indicates no known allergies.  Home Medications  No current outpatient prescriptions on file.  BP 156/99  Pulse 104  Temp(Src) 98 F (36.7 C) (Oral)  Resp 20  SpO2 99%  Physical Exam  Vitals reviewed. Constitutional: He is oriented to person, place, and time. He appears well-developed and well-nourished.  HENT:  Head: Normocephalic and atraumatic.  Eyes: Conjunctivae are normal.  Neck: Normal range of motion. Neck supple.  Cardiovascular: Regular rhythm.   No murmur heard.      Tachycardia  Pulmonary/Chest: Effort normal and breath sounds normal. No respiratory distress.  Abdominal: Soft. Bowel sounds are normal. He exhibits no distension. There is tenderness.        Mild epigastric tenderness, with no peritoneal sign  Musculoskeletal: Normal range of motion. He exhibits no edema.  Neurological: He is alert and oriented to person, place, and time.  Skin: Skin is warm and dry.  Psychiatric: He has a normal mood and affect. Thought content normal.    ED Course  Procedures (including critical care time) 46 year old, male, who drinks alcohol daily.  Presents to emergency department with a complaint of palpitations that are now resolved, and epigastric pain and  tenderness.  On examination, he is mildly tachycardic, with clear lungs, and mild epigastric tenderness without peritoneal signs.  I suspect that he stopped, gastritis, or pancreatitis.  We will establish an IV gave medications, and perform laboratory testing for further evaluation   Labs Reviewed  CBC  BASIC METABOLIC PANEL  COMPREHENSIVE METABOLIC PANEL  LIPASE, BLOOD   No results found.   ED ECG REPORT    ECG ECG  time 00 41 Sinus tachycardia with a heart rate 106 Left axis deviation Normal intervals Left ventricular hypertrophy Nonspecific T-wave changes Impression sinus tachycardia with a left ventricular hypertrophy and nonspecific T-wave change     2:59 AM sxs resolved      MDM  Alcohol gastritis No pancreatitis No acs sxs resolved. No acute abdomen        Cheri Guppy, MD 12/30/11 0300  Cheri Guppy, MD 12/30/11 [phone]

## 2011-12-30 NOTE — ED Notes (Signed)
Pt admits to etoh use tonight an 18 pk per his report, pt sts that he felt his heart race tonight and also reports acid reflux and upper abd pain pt continuously burping during exam. And continously asking if our interventions are ok because he has etoh on board.

## 2012-02-14 ENCOUNTER — Emergency Department (HOSPITAL_COMMUNITY)
Admission: EM | Admit: 2012-02-14 | Discharge: 2012-02-14 | Disposition: A | Discharge status: Home / Self Care | Payer: Self-pay | Attending: Emergency Medicine | Admitting: Emergency Medicine

## 2012-02-14 ENCOUNTER — Emergency Department (HOSPITAL_COMMUNITY): Payer: Self-pay

## 2012-02-14 ENCOUNTER — Encounter (HOSPITAL_COMMUNITY): Payer: Self-pay | Admitting: Emergency Medicine

## 2012-02-14 DIAGNOSIS — I10 Essential (primary) hypertension: Secondary | ICD-10-CM | POA: Insufficient documentation

## 2012-02-14 DIAGNOSIS — K2990 Gastroduodenitis, unspecified, without bleeding: Secondary | ICD-10-CM | POA: Insufficient documentation

## 2012-02-14 DIAGNOSIS — K2970 Gastritis, unspecified, without bleeding: Secondary | ICD-10-CM | POA: Insufficient documentation

## 2012-02-14 LAB — CBC
HCT: 42.9 % (ref 39.0–52.0)
Hemoglobin: 14.7 g/dL (ref 13.0–17.0)
MCH: 30 pg (ref 26.0–34.0)
MCHC: 34.3 g/dL (ref 30.0–36.0)
MCV: 87.6 fL (ref 78.0–100.0)
Platelets: 242 10*3/uL (ref 150–400)
RBC: 4.9 MIL/uL (ref 4.22–5.81)
RDW: 13.4 % (ref 11.5–15.5)
WBC: 5.6 10*3/uL (ref 4.0–10.5)

## 2012-02-14 LAB — POCT I-STAT TROPONIN I: Troponin i, poc: 0 ng/mL (ref 0.00–0.08)

## 2012-02-14 LAB — BASIC METABOLIC PANEL
BUN: 7 mg/dL (ref 6–23)
CO2: 25 mEq/L (ref 19–32)
Calcium: 9.5 mg/dL (ref 8.4–10.5)
Chloride: 101 mEq/L (ref 96–112)
Creatinine, Ser: 0.78 mg/dL (ref 0.50–1.35)
GFR calc Af Amer: >90 mL/min (ref >90)
GFR calc non Af Amer: >90 mL/min (ref >90)
Glucose, Bld: 88 mg/dL (ref 70–99)
Potassium: 3.9 mEq/L (ref 3.5–5.1)
Sodium: 136 mEq/L (ref 135–145)

## 2012-02-14 LAB — HEPATIC FUNCTION PANEL
ALT: 36 U/L (ref 0–53)
AST: 35 U/L (ref 0–37)
Albumin: 4 g/dL (ref 3.5–5.2)
Alkaline Phosphatase: 86 U/L (ref 39–117)
Bilirubin, Direct: <0.1 mg/dL (ref 0.0–0.3)
Total Bilirubin: 0.3 mg/dL (ref 0.3–1.2)
Total Protein: 8.1 g/dL (ref 6.0–8.3)

## 2012-02-14 LAB — LIPASE, BLOOD: Lipase: 34 U/L (ref 11–59)

## 2012-02-14 MED ORDER — FAMOTIDINE 20 MG PO TABS
20.0000 mg | ORAL_TABLET | Freq: Once | ORAL | Status: AC
  Administered 2012-02-14: 20 mg via ORAL
  Filled 2012-02-14: qty 1

## 2012-02-14 MED ORDER — LANSOPRAZOLE 30 MG PO CPDR: 30.0000 mg | DELAYED_RELEASE_CAPSULE | Freq: Every day | ORAL | Status: DC

## 2012-02-14 MED ORDER — GI COCKTAIL ~~LOC~~
30.0000 mL | Freq: Once | ORAL | Status: AC
  Administered 2012-02-14: 30 mL via ORAL
  Filled 2012-02-14: qty 30

## 2012-02-14 MED ORDER — FAMOTIDINE IN NACL 20-0.9 MG/50ML-% IV SOLN: 20.0000 mg | Freq: Once | INTRAVENOUS | Status: DC

## 2012-02-14 NOTE — ED Provider Notes (Signed)
Medical screening examination/treatment/procedure(s) were performed by non-physician practitioner and as supervising physician I was immediately available for consultation/collaboration.   Forbes Cellar, MD 02/14/12 1534

## 2012-02-14 NOTE — ED Notes (Signed)
Pt c/o of increased pressure in his chest. Requesting to leave AMA.  Pt informed to stay a little bit longer so he can be seen

## 2012-02-14 NOTE — ED Notes (Signed)
Pt states that he does drink approx 5 12oz beer/day.  Pt states last time he drank was last night.

## 2012-02-14 NOTE — Discharge Instructions (Signed)
Gastritis Gastritis is an inflammation (the body's way of reacting to injury and/or infection) of the stomach. It is often caused by viral or bacterial (germ) infections. It can also be caused by chemicals (including alcohol) and medications. This illness may be associated with generalized malaise (feeling tired, not well), cramps, and fever. The illness may last 2 to 7 days. If symptoms of gastritis continue, gastroscopy (looking into the stomach with a telescope-like instrument), biopsy (taking tissue samples), and/or blood tests may be necessary to determine the cause. Antibiotics will not affect the illness unless there is a bacterial infection present. One common bacterial cause of gastritis is an organism known as H. Pylori. This can be treated with antibiotics. Other forms of gastritis are caused by too much acid in the stomach. They can be treated with medications such as H2 blockers and antacids. Home treatment is usually all that is needed. Young children will quickly become dehydrated (loss of body fluids) if vomiting and diarrhea are both present. Medications may be given to control nausea. Medications are usually not given for diarrhea unless especially bothersome. Some medications slow the removal of the virus from the gastrointestinal tract. This slows down the healing process. HOME CARE INSTRUCTIONS Home care instructions for nausea and vomiting:  For adults: drink small amounts of fluids often. Drink at least 2 quarts a day. Take sips frequently. Do not drink large amounts of fluid at one time. This may worsen the nausea.   Only take over-the-counter or prescription medicines for pain, discomfort, or fever as directed by your caregiver.   Drink clear liquids only. Those are anything you can see through such as water, broth, or soft drinks.   Once you are keeping clear liquids down, you may start full liquids, soups, juices, and ice cream or sherbet. Slowly add bland (plain, not spicy)  foods to your diet.  Home care instructions for diarrhea:  Diarrhea can be caused by bacterial infections or a virus. Your condition should improve with time, rest, fluids, and/or anti-diarrheal medication.   Until your diarrhea is under control, you should drink clear liquids often in small amounts. Clear liquids include: water, broth, jell-o water and weak tea.  Avoid:  Milk.   Fruits.   Tobacco.   Alcohol.   Extremely hot or cold fluids.   Too much intake of anything at one time.  When your diarrhea stops you may add the following foods, which help the stool to become more formed:  Rice.   Bananas.   Apples without skin.   Dry toast.  Once these foods are tolerated you may add low-fat yogurt and low-fat cottage cheese. They will help to restore the normal bacterial balance in your bowel. Wash your hands well to avoid spreading bacteria (germ) or virus. SEEK IMMEDIATE MEDICAL CARE IF:   You are unable to keep fluids down.   Vomiting or diarrhea become persistent (constant).   Abdominal pain develops, increases, or localizes. (Right sided pain can be appendicitis. Left sided pain in adults can be diverticulitis.)   You develop a fever (an oral temperature above 102 F (38.9 C)).   Diarrhea becomes excessive or contains blood or mucus.   You have excessive weakness, dizziness, fainting or extreme thirst.   You are not improving or you are getting worse.   You have any other questions or concerns.  Document Released: 08/22/2001 Document Revised: 08/17/2011 Document Reviewed: 08/28/2005 [facility] Patient Information 2012 [place], [place]. Go to www.goodrx.com to look up your medications. This  will give you a list of where you can find your prescriptions at the most affordable prices.   Call [facility]  [phone]  If you have no primary doctor, here are some resources that may be helpful:  Medicaid-accepting [facility] Providers:   - [facility]-  [address]      [phone]      Mon-Fri 9am-7pm, Sat 9am-1pm   - [facility]- [address] [place], [place]      [phone]   - [facility]- [address] [place]      [phone]   [facility]- [address]      [phone]   - Renaye Rakers- [address] [place], [address]      [phone]      Only accepts [place] patients       after they have her name applied to their card   Self Pay (no insurance) in [place]:   - Sickle Cell Patients: Dr Willey Blade, [facility]      [address] [place]      [phone]   - [facility] [phone]   - Physician Referral Service- [phone]   - [facility]- [address] [place]      [phone]   [facility]- [address]   - [facility]- see information above      (Speak to [employer] if you do not have insurance)   - [facility]- [address] [place]      [phone]   - [facility] [place]- [address]      [phone]   - [facility]- [address]      [phone]   - Dr Julio Sicks-  [address], [place]      [phone]   - [facility]- [address]      [phone]   - [facility]- [address]      [phone]      Also [address]      [phone]   - [facility]- [address]      [phone]      1st and 3rd Saturday every month, 10am-1pm    Other agencies that provide inexpensive medical care:     [facility]  [phone]    [facility]  [phone]    [facility]  [phone]    [facility]  [phone] [address] [place] [address]    [facility]  [phone]    [facility]  [phone] [facility] [phone]   [address] [place], [place]  [address]  Chronic Pain Problems Contact [facility]  [phone] Patients need to be referred by their primary care doctor.  [facility]  [facility]     [employer]                          [facility]. [address] [place]                       [address]      [address]   [phone] (After Hours)  General Information: Finding a doctor when you do not have health insurance can be tricky. Although you are not limited by an insurance plan, you are of course limited by her finances and how much but he can pay out of pocket.  What are your options if you don't have health insurance?   1) Find a Librarian, academic and Pay Out of Pocket Although you won't have to find out who is covered by your insurance plan, it is a good idea to ask around and get recommendations. You will then need to call the office and see if the doctor you have chosen will accept you as a new patient and what types of options they offer for patients who are self-pay. Some doctors offer discounts or will set up payment plans for their patients who do not have insurance, but you will need to ask so you aren't surprised when you get to your appointment.  2) Contact Your Local Health Department Not all health departments have doctors that can see patients for sick visits, but many do, so it is worth a call to see if yours does. If you don't know where your local health department is, you can check in your phone book. The CDC also has a tool to help you locate your state's health department, and many state websites also have listings of all of their local health departments.  3) Find a Walk-in Clinic If your illness is not likely to be very severe or complicated, you may want to try a walk in clinic. These are popping up all over the country in pharmacies, drugstores, and shopping centers. They're usually staffed by nurse practitioners or physician  assistants that have been trained to treat common illnesses and complaints. They're usually fairly quick and inexpensive. However, if you have serious medical issues or chronic medical problems, these are probably not your best option

## 2012-02-14 NOTE — ED Notes (Signed)
Pt c/o generalized CP with pain in back; pt sts pain started today and is worse with movement and palpation; pt admits to ETOH today

## 2012-02-14 NOTE — ED Notes (Signed)
Pt given urinal to use for urine. Informed that we may need sample later.

## 2012-02-14 NOTE — ED Provider Notes (Addendum)
History     CSN: [id number]  Arrival date & time 02/14/12  1126   First MD Initiated Contact with Patient 02/14/12 1332      Chief Complaint  Patient presents with  . Chest Pain    (Consider location/radiation/quality/duration/timing/severity/associated sxs/prior treatment) Patient is a 46 y.o. male presenting with chest pain. The history is provided by the patient. No language interpreter was used.  Chest Pain The chest pain began 3 - 5 hours ago. Chest pain occurs frequently. The pain is associated with exertion. At its most intense, the pain is at 6/10. The pain is currently at 6/10. The severity of the pain is moderate. The quality of the pain is described as aching and similar to previous episodes.    Patient complains of pain and chest radiating to back and pain in the epigastric abdominal area. This reports he had a similar episode of this in the past and was seen in the emergency department he was told that his heart evaluation at that time was normal. Patient reports that he drinks alcohol daily approximately 7 beers a day. Patient denies any cough he denies any fever no chills he denies nausea vomiting or diarrhea History reviewed. No pertinent past medical history.  History reviewed. No pertinent past surgical history.  Family History  Problem Relation Age of Onset  . Hypertension Mother   . Hypertension Father     History  Substance Use Topics  . Smoking status: Never Smoker   . Smokeless tobacco: Not on file  . Alcohol Use: Yes     daily- about 5 beers a day      Review of Systems  Cardiovascular: Positive for chest pain.  All other systems reviewed and are negative.    Allergies  Review of patient's allergies indicates no known allergies.  Home Medications  No current outpatient prescriptions on file.  BP 143/81  Pulse 82  Temp(Src) 99 F (37.2 C) (Oral)  Resp 18  SpO2 96%  Physical Exam  Constitutional: He is oriented to person, place, and  time. He appears well-developed and well-nourished.  HENT:  Head: Normocephalic and atraumatic.  Right Ear: External ear normal.  Left Ear: External ear normal.  Nose: Nose normal.  Mouth/Throat: Oropharynx is clear and moist.  Eyes: Conjunctivae and EOM are normal. Pupils are equal, round, and reactive to light.  Neck: Neck supple.  Cardiovascular: Normal rate, regular rhythm, normal heart sounds and intact distal pulses.   Pulmonary/Chest: Effort normal and breath sounds normal.  Abdominal: Soft. Bowel sounds are normal. There is tenderness.       Tender upper and mid epigastric area nontender right upper quadrant  Musculoskeletal: Normal range of motion.  Neurological: He is alert and oriented to person, place, and time. He has normal reflexes.  Skin: Skin is warm.  Psychiatric: He has a normal mood and affect.   Date: 02/14/2012  Rate: 97  Rhythm: normal sinus rhythm  QRS Axis: normal  Intervals: normal  ST/T Wave abnormalities: nonspecific ST/T changes  Conduction Disutrbances:none  Narrative Interpretation:   Old EKG Reviewed: unchanged   ED Course  Procedures (including critical care time)   Labs Reviewed  CBC  BASIC METABOLIC PANEL   Dg Chest 2 View  02/14/2012  *RADIOLOGY REPORT*  Clinical Data: Chest pain.  CHEST - 2 VIEW  Comparison: PA and lateral chest 12/30/2011 and 12/23/2010.  Findings: The lungs are clear.  Heart size is normal.  There is no pneumothorax or pleural effusion.  IMPRESSION: No acute disease.  Original Report Authenticated By: Bernadene Bell. Maricela Curet, M.D.     No diagnosis found.    MDM     Results for orders placed during the hospital encounter of 02/14/12  CBC      Component Value Range   WBC 5.6  4.0 - 10.5 (K/uL)   RBC 4.90  4.22 - 5.81 (MIL/uL)   Hemoglobin 14.7  13.0 - 17.0 (g/dL)   HCT 16.1  09.6 - 04.5 (%)   MCV 87.6  78.0 - 100.0 (fL)   MCH 30.0  26.0 - 34.0 (pg)   MCHC 34.3  30.0 - 36.0 (g/dL)   RDW 40.9  81.1 - 91.4 (%)    Platelets 242  150 - 400 (K/uL)  BASIC METABOLIC PANEL      Component Value Range   Sodium 136  135 - 145 (mEq/L)   Potassium 3.9  3.5 - 5.1 (mEq/L)   Chloride 101  96 - 112 (mEq/L)   CO2 25  19 - 32 (mEq/L)   Glucose, Bld 88  70 - 99 (mg/dL)   BUN 7  6 - 23 (mg/dL)   Creatinine, Ser 7.82  0.50 - 1.35 (mg/dL)   Calcium 9.5  8.4 - 95.6 (mg/dL)   GFR calc non Af Amer >90  >90 (mL/min)   GFR calc Af Amer >90  >90 (mL/min)  POCT I-STAT TROPONIN I      Component Value Range   Troponin i, poc 0.00  0.00 - 0.08 (ng/mL)   Comment 3            Dg Chest 2 View  02/14/2012  *RADIOLOGY REPORT*  Clinical Data: Chest pain.  CHEST - 2 VIEW  Comparison: PA and lateral chest 12/30/2011 and 12/23/2010.  Findings: The lungs are clear.  Heart size is normal.  There is no pneumothorax or pleural effusion.  IMPRESSION: No acute disease.  Original Report Authenticated By: Bernadene Bell. Maricela Curet, M.D.     I suspect alcohol gastritis.   Pt given gi coctail and pepcid.   I advised pepcid for symptoms   Lonia Skinner [place], [place] 02/14/12 1443  Lonia Skinner [place], [place] 02/14/12 1501  Lonia Skinner [place], [place] 03/14/12 [phone]

## 2012-06-23 DIAGNOSIS — K029 Dental caries, unspecified: Secondary | ICD-10-CM | POA: Insufficient documentation

## 2012-06-23 DIAGNOSIS — R030 Elevated blood-pressure reading, without diagnosis of hypertension: Secondary | ICD-10-CM | POA: Insufficient documentation

## 2012-06-24 ENCOUNTER — Emergency Department (HOSPITAL_COMMUNITY)
Admission: EM | Admit: 2012-06-24 | Discharge: 2012-06-24 | Disposition: A | Discharge status: Home / Self Care | Payer: Self-pay | Attending: Emergency Medicine | Admitting: Emergency Medicine

## 2012-06-24 ENCOUNTER — Encounter (HOSPITAL_COMMUNITY): Payer: Self-pay | Admitting: *Deleted

## 2012-06-24 DIAGNOSIS — K069 Disorder of gingiva and edentulous alveolar ridge, unspecified: Secondary | ICD-10-CM

## 2012-06-24 DIAGNOSIS — K029 Dental caries, unspecified: Secondary | ICD-10-CM

## 2012-06-24 MED ORDER — CLINDAMYCIN HCL 150 MG PO CAPS: 300.0000 mg | ORAL_CAPSULE | Freq: Four times a day (QID) | ORAL | Status: DC

## 2012-06-24 MED ORDER — CLINDAMYCIN HCL 300 MG PO CAPS
300.0000 mg | ORAL_CAPSULE | Freq: Once | ORAL | Status: AC
  Administered 2012-06-24: 300 mg via ORAL
  Filled 2012-06-24: qty 1

## 2012-06-24 MED ORDER — OXYCODONE-ACETAMINOPHEN 5-325 MG PO TABS
2.0000 | ORAL_TABLET | Freq: Once | ORAL | Status: AC
  Administered 2012-06-24: 2 via ORAL
  Filled 2012-06-24: qty 2

## 2012-06-24 MED ORDER — IBUPROFEN 800 MG PO TABS
800.0000 mg | ORAL_TABLET | Freq: Once | ORAL | Status: AC
  Administered 2012-06-24: 800 mg via ORAL
  Filled 2012-06-24: qty 1

## 2012-06-24 MED ORDER — IBUPROFEN 600 MG PO TABS: 600.0000 mg | ORAL_TABLET | Freq: Four times a day (QID) | ORAL | Status: DC | PRN

## 2012-06-24 MED ORDER — OXYCODONE-ACETAMINOPHEN 5-325 MG PO TABS: 1.0000 | ORAL_TABLET | Freq: Four times a day (QID) | ORAL | Status: DC | PRN

## 2012-06-24 NOTE — ED Provider Notes (Signed)
History     CSN: [id number]  Arrival date & time 06/23/12  2347   First MD Initiated Contact with Patient 06/24/12 0014      Chief Complaint  Patient presents with  . Dental Pain  . Oral Swelling    (Consider location/radiation/quality/duration/timing/severity/associated sxs/prior treatment) HPI Comments: Wesley [name] presents ambulatory for evaluation of facial swelling and dental pain. He has been having intermittent pain with a right upper tooth for several weeks and awoke this morning with severe pain and facial swelling. He reports that he knows he has multiple cavities. He denies any tongue elevation or throat swelling, shortness of breath, fever, pain with eye movement or pain behind the eyes, or neck pain. He denies any draining wounds in his mouth. He denies any other complaints at this time.  Patient is a 46 y.o. male presenting with tooth pain. The history is provided by the patient. No language interpreter was used.  Dental PainThe primary symptoms include mouth pain. Primary symptoms do not include oral bleeding, oral lesions, headaches, fever, shortness of breath, sore throat, angioedema or cough. The symptoms began 12 to 24 hours ago. The symptoms are worsening. The symptoms are new. The symptoms occur constantly.  Additional symptoms include: dental sensitivity to temperature, gum swelling, gum tenderness and facial swelling. Additional symptoms do not include: purulent gums, trismus, jaw pain, trouble swallowing, pain with swallowing, excessive salivation, dry mouth, taste disturbance, smell disturbance, drooling, ear pain and fatigue. Medical issues include: periodontal disease. Medical issues do not include: alcohol problem and chewing tobacco.    History reviewed. No pertinent past medical history.  History reviewed. No pertinent past surgical history.  Family History  Problem Relation Age of Onset  . Hypertension Mother   . Hypertension Father     History    Substance Use Topics  . Smoking status: Never Smoker   . Smokeless tobacco: Not on file  . Alcohol Use: Yes     daily- about 5 beers a day      Review of Systems  Constitutional: Negative for fever and fatigue.  HENT: Positive for facial swelling. Negative for ear pain, sore throat, drooling and trouble swallowing.   Respiratory: Negative for cough and shortness of breath.   Neurological: Negative for headaches.  All other systems reviewed and are negative.    Allergies  Review of patient's allergies indicates no known allergies.  Home Medications   Current Outpatient Rx  Name Route Sig Dispense Refill  . BC HEADACHE PO Oral Take 1 Package by mouth 2 (two) times daily as needed. For pain      BP 144/100  Pulse 78  Temp 98.8 F (37.1 C) (Oral)  Resp 18  SpO2 99%  Physical Exam  Nursing note and vitals reviewed. Constitutional: He is oriented to person, place, and time. He appears well-developed and well-nourished. No distress.  HENT:  Head: Normocephalic and atraumatic. Head is without raccoon's eyes, without Battle's sign, without right periorbital erythema and without left periorbital erythema. No trismus in the jaw.    Right Ear: External ear normal.  Left Ear: External ear normal.  Nose: Nose normal.  Mouth/Throat: Mucous membranes are normal. Mucous membranes are not pale, not dry and not cyanotic. He does not have dentures. No oral lesions. Abnormal dentition. Dental caries present. No dental abscesses, uvula swelling or lacerations. No oropharyngeal exudate, posterior oropharyngeal edema, posterior oropharyngeal erythema or tonsillar abscesses.         Area of swelling.  No palpable  fluctuance.  No posterior pharynx asymmetry.  Note multiple decayed or fractured teeth.  Pt has pain to palp and percussion + discoloration of the rt upper central incisor.    Eyes: Conjunctivae normal and EOM are normal. Pupils are equal, round, and reactive to light. Right eye  exhibits no discharge. Left eye exhibits no discharge. No scleral icterus.  Neck: Normal range of motion. Neck supple. No JVD present. No tracheal deviation present.  Cardiovascular: Normal rate, regular rhythm, normal heart sounds and intact distal pulses.  Exam reveals no gallop and no friction rub.   No murmur heard. Pulmonary/Chest: Effort normal and breath sounds normal. No stridor. No respiratory distress. He has no wheezes. He has no rales. He exhibits no tenderness.  Abdominal: Soft. Bowel sounds are normal. He exhibits no distension and no mass. There is no tenderness. There is no rebound and no guarding.  Musculoskeletal: Normal range of motion. He exhibits no edema and no tenderness.  Lymphadenopathy:    He has no cervical adenopathy.  Neurological: He is alert and oriented to person, place, and time.  Skin: Skin is warm and dry. No rash noted. He is not diaphoretic. No erythema. No pallor.  Psychiatric: He has a normal mood and affect. His behavior is normal.    ED Course  Procedures (including critical care time)  Labs Reviewed - No data to display No results found.   No diagnosis found.    MDM  Pt presents for evaluation of dental pain.  Note mildly elevated BP, NAD.  There is no evidence of respiratory difficulty.  Administered po clindamycin, ibuprofen, and percocet.  Will reassess shortly.  0200.  Patient appears comfortable and in no acute distress. He has no visible respiratory insufficiency. Status post receiving medications as described above he reports his pain is greatly improved. I strongly encouraged outpatient followup with a dentist or oral surgeon as soon as possible. I will prescribe at this time clindamycin, ibuprofen, and Percocet. Plan discharge home to       Tobin Chad, MD 06/24/12 [phone]

## 2012-06-24 NOTE — ED Notes (Addendum)
C/o R upper dental pain & swelling, onset this morning, also chills (cold & hot). Denies nvd or fever. R maxillary area swelling present. advil and BC 3 hrs PTA

## 2012-06-24 NOTE — ED Notes (Signed)
Pt reports waking up this am with swollen (R) side of face and lip.  Pt is noted to be very swollen, speech is altered due to swelling.  No airway compromise.  Pt reports pain, denies injury.

## 2012-11-14 ENCOUNTER — Encounter (HOSPITAL_COMMUNITY): Payer: Self-pay | Admitting: Emergency Medicine

## 2012-11-14 ENCOUNTER — Emergency Department (HOSPITAL_COMMUNITY)
Admission: EM | Admit: 2012-11-14 | Discharge: 2012-11-14 | Disposition: A | Discharge status: Home / Self Care | Payer: Self-pay | Attending: Emergency Medicine | Admitting: Emergency Medicine

## 2012-11-14 ENCOUNTER — Emergency Department (HOSPITAL_COMMUNITY): Payer: Self-pay

## 2012-11-14 DIAGNOSIS — R6883 Chills (without fever): Secondary | ICD-10-CM | POA: Insufficient documentation

## 2012-11-14 DIAGNOSIS — J3489 Other specified disorders of nose and nasal sinuses: Secondary | ICD-10-CM | POA: Insufficient documentation

## 2012-11-14 DIAGNOSIS — R0602 Shortness of breath: Secondary | ICD-10-CM | POA: Insufficient documentation

## 2012-11-14 DIAGNOSIS — J069 Acute upper respiratory infection, unspecified: Secondary | ICD-10-CM | POA: Insufficient documentation

## 2012-11-14 DIAGNOSIS — R079 Chest pain, unspecified: Secondary | ICD-10-CM | POA: Insufficient documentation

## 2012-11-14 DIAGNOSIS — I10 Essential (primary) hypertension: Secondary | ICD-10-CM | POA: Insufficient documentation

## 2012-11-14 HISTORY — DX: Essential (primary) hypertension: I10

## 2012-11-14 MED ORDER — GUAIFENESIN 100 MG/5ML PO SYRP: 100.0000 mg | ORAL_SOLUTION | ORAL | Status: DC | PRN

## 2012-11-14 NOTE — Discharge Instructions (Signed)
RESOURCE GUIDE ° °Insufficient Money for Medicine: °Contact [employer]:  call "211" or [facility] [phone]. ° °No Primary Care Doctor: °- Call [facility]  [phone] - can help you locate a primary care doctor that  accepts your insurance, provides certain services, etc. °- Physician Referral Service- [phone] ° °Agencies that provide inexpensive medical care: °- [facility]  [phone] °- [facility]  [phone] °- [facility]  [phone] °- [facility]  [phone] °- [facility]  [phone] °- [name]  [phone] ° °Medicaid-accepting [name] Providers: °- [facility]- [address] ° [phone], Mon-Fri 9am-7pm, Sat 9am-1pm °- [name]- [address] ° [phone] °- [facility]- [address] ° [phone] °- [facility]- [address] [place] [address] ° [phone] °- Veita Bland- [address], [phone] ° Only accepts [place] Access Medicaid patients after they have their name  applied to their card ° °Self Pay (no insurance) in [name]: °- Sickle Cell Patients: Dr Eric Dean, [name] ° [address], [phone] °- [facility]- [address] ° [phone] °      -     [facility]- [address] °      -     [facility]- see information above (Speak to Pam H if you do not have insurance) °      -  [facility]- [address], [phone] °      -  [facility] [place]- [address],  [phone] °      -  [facility]- [address] [place] [address], [phone] °      -  Dr Osei-Bonsu-  [address], [place], [phone] °      -  [facility]- [address], [phone] °      -  [facility]- [address] [place] [address], [phone], also [address], [phone] °      -     [facility]- [address], [phone], 1st & 3rd Saturday   every month, 10am-1pm ° °1) Find a Doctor and Pay Out of Pocket °Although you won't have to find out who is covered by your insurance plan, it is a good idea to ask around and get recommendations. You will then need to call the office and see if the doctor you have chosen will accept you as a new patient and what types of options they offer for patients who are self-pay. Some doctors offer discounts or will set up payment plans for their patients who do not have insurance, but you will need to ask so you aren't surprised when you get to your appointment. ° °2) Contact Your Local Health Department °Not all health departments have doctors that can see patients for sick visits, but many do, so it is worth a call to see if yours does. If you don't know where your local health department is, you can check in your phone book. The CDC also has a tool to help you locate your state's health department, and many state websites also have listings of all of their local health departments. ° °3) Find a Walk-in Clinic °If your illness is not likely to be   very severe or complicated, you may want to try a walk in clinic. These are popping up all over the country in pharmacies, drugstores, and shopping centers. They're usually staffed by nurse practitioners or physician assistants that have been trained to treat common illnesses and complaints. They're usually fairly quick and inexpensive. However, if you have serious medical issues or chronic medical problems, these are probably not your best option ° ° ° ° °

## 2012-11-14 NOTE — ED Notes (Addendum)
Pt c/o cough, st's he has been using otc meds without relief.  St's he can't sleep at night due to coughing.  Also c/o pain in his chest when he coughs or when he is walking.

## 2012-11-14 NOTE — ED Provider Notes (Signed)
History     CSN: [id number]  Arrival date & time 11/14/12  1709   First MD Initiated Contact with Patient 11/14/12 1915      Chief Complaint  Patient presents with  . Cough    (Consider location/radiation/quality/duration/timing/severity/associated sxs/prior treatment) HPI Comments: Pt is a 47 y.o. Male who has had a persistent cough for the last three days. Bought OTC cough med and it helped. Pt states cough is a wet cough. Chest started to hurt when he coughed. Pt is most concerned about the fact that he can't stop coughing at night to get some sleep.   Pt states pain is a 5/10 only when he coughs.   Admits rhinorrhea, shortness of breath, chills Denies fever, no sick contacts, headache, no nasea/vomiting, no constipation diarrhea.   Patient is a 47 y.o. male presenting with cough.  Cough Associated symptoms: chills and shortness of breath   Associated symptoms: no chest pain, no diaphoresis, no fever, no headaches and no rash     Past Medical History  Diagnosis Date  . Hypertension     History reviewed. No pertinent past surgical history.  Family History  Problem Relation Age of Onset  . Hypertension Mother   . Hypertension Father     History  Substance Use Topics  . Smoking status: Never Smoker   . Smokeless tobacco: Not on file  . Alcohol Use: Yes     Comment: daily- about 5 beers a day      Review of Systems  Constitutional: Positive for chills. Negative for fever and diaphoresis.  HENT: Negative for neck pain and neck stiffness.   Eyes: Negative for visual disturbance.  Respiratory: Positive for cough and shortness of breath. Negative for apnea and chest tightness.        Pain in chest when he coughs. Wet cough. No hemoptysis  Cardiovascular: Negative for chest pain and palpitations.  Gastrointestinal: Negative for nausea, vomiting, diarrhea and constipation.  Genitourinary: Negative for dysuria.  Musculoskeletal: Negative for gait problem.  Skin:  Negative for rash.  Neurological: Negative for dizziness, weakness, light-headedness, numbness and headaches.    Allergies  Review of patient's allergies indicates no known allergies.  Home Medications   Current Outpatient Rx  Name  Route  Sig  Dispense  Refill  . Aspirin-Salicylamide-Caffeine (BC HEADACHE PO)   Oral   Take 1 Package by mouth 2 (two) times daily as needed. For pain         . Pseudoephedrine-DM-GG (ROBITUSSIN CF PO)   Oral   Take 15-30 mLs by mouth every 6 (six) hours as needed (cough).           BP 158/100  Pulse 100  Temp(Src) 98.6 F (37 C) (Oral)  Resp 20  SpO2 99%  Physical Exam  Nursing note and vitals reviewed. Constitutional: He is oriented to person, place, and time. He appears well-developed and well-nourished. No distress.  HENT:  Head: Normocephalic and atraumatic.  Eyes: EOM are normal. Pupils are equal, round, and reactive to light.  Neck: Normal range of motion. Neck supple.  No meningeal signs  Cardiovascular: Normal rate, regular rhythm and normal heart sounds.  Exam reveals no gallop and no friction rub.   No murmur heard. Pulmonary/Chest: No respiratory distress. He has no wheezes. He has no rales. He exhibits no tenderness.  Coarse lung sounds bilateral upper lobes. Tachpynic at 24 during exam  Abdominal: Soft. Bowel sounds are normal. He exhibits no distension. There is no tenderness. There  is no rebound and no guarding.  Musculoskeletal: Normal range of motion. He exhibits no edema and no tenderness.  Neurological: He is alert and oriented to person, place, and time. No cranial nerve deficit.  Skin: Skin is warm and dry. He is not diaphoretic. No erythema.    ED Course  Procedures (including critical care time)  Labs Reviewed - No data to display No results found.  Date: 11/14/2012  Rate: 92  Rhythm: normal sinus rhythm  QRS Axis: left axis deviation  Intervals: normal  ST/T Wave abnormalities: normal  Conduction  Disutrbances: low voltage critera  Narrative Interpretation: abnormal EKG  Old EKG Reviewed: 02/14/2012 shows left axis deviation, low voltage criteria Dg Chest 2 View  11/14/2012  *RADIOLOGY REPORT*  Clinical Data: Chest pain and cough.  CHEST - 2 VIEW  Comparison: PA and lateral chest 02/14/2012.  Findings: Lungs are clear.  Heart size is normal.  No pneumothorax or pleural effusion.  IMPRESSION: No acute disease.   Original Report Authenticated By: Holley Dexter, M.D.      Diagnosis: upper respiratory infection    MDM  Uri vs. Bronchitis. Vs atypical pneumonia. EKG normal sinus rhythm. Not concerning for ACS. Will get CXR and re-evaluate.   Pt CXR negative for acute infiltrate. Patients symptoms are consistent with URI, likely viral etiology. Discussed that antibiotics are not indicated for viral infections. Pt will be discharged with symptomatic treatment.  Verbalizes understanding and is agreeable with plan. Pt is hemodynamically stable & in NAD prior to dc. Will provide resource list for primary care and cough suppressant. Discussed case with Dr. Juleen China who is in agreement with plan.     Glade Nurse, PA-C 11/15/12 1041

## 2012-11-14 NOTE — ED Notes (Signed)
Pt c/o productive cough x 3 days, reports he feels the cough is getting worse and now when he coughs he gets a sharp pain in his chest. Pt in nad, resp e/u, skin warm and dry.

## 2012-11-18 NOTE — ED Provider Notes (Signed)
Medical screening examination/treatment/procedure(s) were performed by non-physician practitioner and as supervising physician I was immediately available for consultation/collaboration.  Raeford Razor, MD 11/18/12 [phone]

## 2014-07-01 ENCOUNTER — Encounter (HOSPITAL_COMMUNITY): Payer: Self-pay | Admitting: Emergency Medicine

## 2014-07-01 ENCOUNTER — Emergency Department (HOSPITAL_COMMUNITY): Payer: Self-pay

## 2014-07-01 ENCOUNTER — Emergency Department (HOSPITAL_COMMUNITY)
Admission: EM | Admit: 2014-07-01 | Discharge: 2014-07-01 | Discharge status: Against Medical Advice | Payer: Self-pay | Attending: Emergency Medicine | Admitting: Emergency Medicine

## 2014-07-01 DIAGNOSIS — I10 Essential (primary) hypertension: Secondary | ICD-10-CM | POA: Insufficient documentation

## 2014-07-01 DIAGNOSIS — M79632 Pain in left forearm: Secondary | ICD-10-CM | POA: Insufficient documentation

## 2014-07-01 DIAGNOSIS — R0602 Shortness of breath: Secondary | ICD-10-CM | POA: Insufficient documentation

## 2014-07-01 LAB — BASIC METABOLIC PANEL
Anion gap: 12 (ref 5–15)
BUN: 11 mg/dL (ref 6–23)
CO2: 24 mEq/L (ref 19–32)
Calcium: 8.7 mg/dL (ref 8.4–10.5)
Chloride: 101 mEq/L (ref 96–112)
Creatinine, Ser: 0.72 mg/dL (ref 0.50–1.35)
GFR calc Af Amer: >90 mL/min (ref >90)
GFR calc non Af Amer: >90 mL/min (ref >90)
Glucose, Bld: 120 mg/dL — ABNORMAL HIGH (ref 70–99)
Potassium: 4.7 mEq/L (ref 3.7–5.3)
Sodium: 137 mEq/L (ref 137–147)

## 2014-07-01 LAB — I-STAT TROPONIN, ED: Troponin i, poc: 0.01 ng/mL (ref 0.00–0.08)

## 2014-07-01 LAB — CBC
HCT: 42.1 % (ref 39.0–52.0)
Hemoglobin: 14.5 g/dL (ref 13.0–17.0)
MCH: 30.6 pg (ref 26.0–34.0)
MCHC: 34.4 g/dL (ref 30.0–36.0)
MCV: 88.8 fL (ref 78.0–100.0)
Platelets: 245 10*3/uL (ref 150–400)
RBC: 4.74 MIL/uL (ref 4.22–5.81)
RDW: 12.5 % (ref 11.5–15.5)
WBC: 5.1 10*3/uL (ref 4.0–10.5)

## 2014-07-01 NOTE — ED Notes (Signed)
Pt presents with sharp pains in left forearm that radiate up and down left arm onset yesterday- denies CP.  Pt admits to some shortness of breath since yesterday as well.  Denies cough or fevers.  Denies N/V.

## 2015-10-26 ENCOUNTER — Emergency Department (HOSPITAL_COMMUNITY): Payer: Self-pay

## 2015-10-26 ENCOUNTER — Encounter (HOSPITAL_COMMUNITY): Payer: Self-pay | Admitting: *Deleted

## 2015-10-26 ENCOUNTER — Other Ambulatory Visit: Payer: Self-pay

## 2015-10-26 DIAGNOSIS — M62838 Other muscle spasm: Secondary | ICD-10-CM | POA: Insufficient documentation

## 2015-10-26 DIAGNOSIS — R200 Anesthesia of skin: Secondary | ICD-10-CM | POA: Insufficient documentation

## 2015-10-26 DIAGNOSIS — I10 Essential (primary) hypertension: Secondary | ICD-10-CM | POA: Insufficient documentation

## 2015-10-26 DIAGNOSIS — R079 Chest pain, unspecified: Secondary | ICD-10-CM | POA: Insufficient documentation

## 2015-10-26 LAB — BASIC METABOLIC PANEL
Anion gap: 13 (ref 5–15)
BUN: 10 mg/dL (ref 6–20)
CO2: 25 mmol/L (ref 22–32)
Calcium: 9.8 mg/dL (ref 8.9–10.3)
Chloride: 99 mmol/L — ABNORMAL LOW (ref 101–111)
Creatinine, Ser: 0.91 mg/dL (ref 0.61–1.24)
GFR calc Af Amer: >60 mL/min (ref >60)
GFR calc non Af Amer: >60 mL/min (ref >60)
Glucose, Bld: 105 mg/dL — ABNORMAL HIGH (ref 65–99)
Potassium: 4.2 mmol/L (ref 3.5–5.1)
Sodium: 137 mmol/L (ref 135–145)

## 2015-10-26 LAB — CBC
HCT: 42.4 % (ref 39.0–52.0)
Hemoglobin: 14 g/dL (ref 13.0–17.0)
MCH: 28.7 pg (ref 26.0–34.0)
MCHC: 33 g/dL (ref 30.0–36.0)
MCV: 87.1 fL (ref 78.0–100.0)
Platelets: 267 10*3/uL (ref 150–400)
RBC: 4.87 MIL/uL (ref 4.22–5.81)
RDW: 12.9 % (ref 11.5–15.5)
WBC: 5.7 10*3/uL (ref 4.0–10.5)

## 2015-10-26 LAB — I-STAT TROPONIN, ED: Troponin i, poc: 0 ng/mL (ref 0.00–0.08)

## 2015-10-26 NOTE — ED Notes (Signed)
Unable to locate when called for re-assessment x3

## 2015-10-26 NOTE — ED Notes (Signed)
Pt reports left sided chest spasms, and left arm numbness. Pt admits using cocaine last night. Pt denies pain at this time. Pt states left arm is still numb. Denies shortness of breath, nausea.

## 2015-10-27 ENCOUNTER — Emergency Department (HOSPITAL_COMMUNITY)
Admission: EM | Admit: 2015-10-27 | Discharge: 2015-10-27 | Disposition: A | Discharge status: Home / Self Care | Payer: Self-pay | Attending: Emergency Medicine | Admitting: Emergency Medicine

## 2015-10-27 NOTE — ED Notes (Signed)
Unable to locate for room

## 2015-12-19 ENCOUNTER — Encounter (HOSPITAL_COMMUNITY): Payer: Self-pay | Admitting: Nurse Practitioner

## 2015-12-19 ENCOUNTER — Emergency Department (HOSPITAL_COMMUNITY)
Admission: EM | Admit: 2015-12-19 | Discharge: 2015-12-19 | Disposition: A | Discharge status: Home / Self Care | Payer: Self-pay | Attending: Emergency Medicine | Admitting: Emergency Medicine

## 2015-12-19 ENCOUNTER — Emergency Department (HOSPITAL_COMMUNITY): Payer: Self-pay

## 2015-12-19 DIAGNOSIS — I10 Essential (primary) hypertension: Secondary | ICD-10-CM | POA: Insufficient documentation

## 2015-12-19 DIAGNOSIS — J40 Bronchitis, not specified as acute or chronic: Secondary | ICD-10-CM

## 2015-12-19 DIAGNOSIS — J209 Acute bronchitis, unspecified: Secondary | ICD-10-CM | POA: Insufficient documentation

## 2015-12-19 MED ORDER — BENZONATATE 100 MG PO CAPS: 100.0000 mg | ORAL_CAPSULE | Freq: Three times a day (TID) | ORAL | Status: DC

## 2015-12-19 MED ORDER — AZITHROMYCIN 250 MG PO TABS: 250.0000 mg | ORAL_TABLET | Freq: Every day | ORAL | Status: DC

## 2015-12-19 NOTE — ED Notes (Signed)
He c/o cough, fevers and body aches x 3 days. He took tylenol and ibuprofen with minimal relief. He si A&Ox4, breathing easily

## 2015-12-19 NOTE — Discharge Instructions (Signed)

## 2015-12-19 NOTE — ED Notes (Signed)
EDP at bedside  

## 2015-12-19 NOTE — ED Provider Notes (Signed)
CSN: [id number]     Arrival date & time 12/19/15  1206 History   First MD Initiated Contact with Patient 12/19/15 1235     Chief Complaint  Patient presents with  . Generalized Body Aches     (Consider location/radiation/quality/duration/timing/severity/associated sxs/prior Treatment) Patient is a 50 y.o. male presenting with cough. The history is provided by the patient. No language interpreter was used.  Cough Cough characteristics:  Productive Sputum characteristics:  Yellow Severity:  Moderate Onset quality:  Gradual Duration:  2 days Timing:  Constant Progression:  Worsening Chronicity:  New Smoker: no   Context: upper respiratory infection   Context: not sick contacts   Relieved by:  Nothing Worsened by:  Nothing tried Ineffective treatments:  None tried Associated symptoms: myalgias and shortness of breath   Risk factors: no recent infection     Past Medical History  Diagnosis Date  . Hypertension    No past surgical history on file. Family History  Problem Relation Age of Onset  . Hypertension Mother   . Hypertension Father    Social History  Substance Use Topics  . Smoking status: Never Smoker   . Smokeless tobacco: Not on file  . Alcohol Use: Yes     Comment: daily- about 5 beers a day    Review of Systems  Respiratory: Positive for cough and shortness of breath.   Musculoskeletal: Positive for myalgias.  All other systems reviewed and are negative.     Allergies  Review of patient's allergies indicates no known allergies.  Home Medications   Prior to Admission medications   Medication Sig Start Date End Date Taking? Authorizing Provider  acetaminophen (TYLENOL) 325 MG tablet Take 650 mg by mouth every 6 (six) hours as needed for mild pain or headache.    Historical Provider, MD   BP 109/82 mmHg  Pulse 90  Temp(Src) 99 F (37.2 C) (Oral)  Resp 18  SpO2 90% Physical Exam  Constitutional: He is oriented to person, place, and time. He  appears well-developed and well-nourished.  HENT:  Head: Normocephalic and atraumatic.  Eyes: Conjunctivae and EOM are normal. Pupils are equal, round, and reactive to light.  Neck: Normal range of motion.  Cardiovascular: Normal rate and normal heart sounds.   Pulmonary/Chest: Effort normal and breath sounds normal.  Abdominal: Soft. He exhibits no distension.  Musculoskeletal: Normal range of motion.  Neurological: He is alert and oriented to person, place, and time.  Skin: Skin is warm.  Psychiatric: He has a normal mood and affect.  Nursing note and vitals reviewed.   ED Course  Procedures (including critical care time) Labs Review Labs Reviewed - No data to display  Imaging Review Dg Chest 2 View  12/19/2015  CLINICAL DATA:  Cough and fevers.  Body aches EXAM: CHEST  2 VIEW COMPARISON:  10/25/2014 FINDINGS: Normal heart size. There is no pleural effusion or edema identified. No airspace consolidation identified. IMPRESSION: 1. No acute cardiopulmonary abnormalities. Electronically Signed   By: Signa Kell M.D.   On: 12/19/2015 13:41   I have personally reviewed and evaluated these images and lab results as part of my medical decision-making.   EKG Interpretation None      MDM    Final diagnoses:  Bronchitis    Meds ordered this encounter  Medications  . azithromycin (ZITHROMAX) 250 MG tablet    Sig: Take 1 tablet (250 mg total) by mouth daily. Take first 2 tablets together, then 1 every day until finished.  Dispense:  6 tablet    Refill:  0    Order Specific Question:  Supervising Provider    Answer:  MILLER, BRIAN [[id number]]  . benzonatate (TESSALON) 100 MG capsule    Sig: Take 1 capsule (100 mg total) by mouth every 8 (eight) hours.    Dispense:  21 capsule    Refill:  0    Order Specific Question:  Supervising Provider    Answer:  Eber HongMILLER, BRIAN [[id number]]   An After Visit Summary was printed and given to the patient.   Lonia SkinnerLeslie K Sands PointSofia, PA-C 12/19/15  1434  Linwood DibblesJon Knapp, MD 12/19/15 1447

## 2016-03-10 ENCOUNTER — Encounter (HOSPITAL_COMMUNITY): Payer: Self-pay | Admitting: *Deleted

## 2016-03-10 ENCOUNTER — Emergency Department (HOSPITAL_COMMUNITY): Admission: EM | Admit: 2016-03-10 | Discharge: 2016-03-10 | Discharge status: Absent without leave | Payer: Self-pay

## 2016-03-10 DIAGNOSIS — W132XXA Fall from, out of or through roof, initial encounter: Secondary | ICD-10-CM | POA: Insufficient documentation

## 2016-03-10 DIAGNOSIS — Y929 Unspecified place or not applicable: Secondary | ICD-10-CM | POA: Insufficient documentation

## 2016-03-10 DIAGNOSIS — R51 Headache: Secondary | ICD-10-CM | POA: Insufficient documentation

## 2016-03-10 DIAGNOSIS — Y999 Unspecified external cause status: Secondary | ICD-10-CM | POA: Insufficient documentation

## 2016-03-10 DIAGNOSIS — I10 Essential (primary) hypertension: Secondary | ICD-10-CM | POA: Insufficient documentation

## 2016-03-10 DIAGNOSIS — Y939 Activity, unspecified: Secondary | ICD-10-CM | POA: Insufficient documentation

## 2016-03-10 NOTE — ED Notes (Signed)
The pt fell from a one story room 1400 today he is c/o neck and lt flank pain since  No loc

## 2016-03-10 NOTE — ED Notes (Signed)
c-collar applied at triage 

## 2016-03-10 NOTE — ED Notes (Signed)
The pt also has cuts and bruises to his lt elbow and forearm

## 2016-03-11 ENCOUNTER — Encounter (HOSPITAL_COMMUNITY): Payer: Self-pay | Admitting: Emergency Medicine

## 2016-03-11 ENCOUNTER — Emergency Department (HOSPITAL_COMMUNITY): Payer: Self-pay

## 2016-03-11 ENCOUNTER — Emergency Department (HOSPITAL_COMMUNITY)
Admission: EM | Admit: 2016-03-11 | Discharge: 2016-03-11 | Disposition: A | Discharge status: Home / Self Care | Payer: Self-pay | Attending: Emergency Medicine | Admitting: Emergency Medicine

## 2016-03-11 DIAGNOSIS — W19XXXA Unspecified fall, initial encounter: Secondary | ICD-10-CM

## 2016-03-11 LAB — CBC WITH DIFFERENTIAL/PLATELET
Basophils Absolute: 0 10*3/uL (ref 0.0–0.1)
Basophils Relative: 0 %
Eosinophils Absolute: 0.3 10*3/uL (ref 0.0–0.7)
Eosinophils Relative: 5 %
HCT: 40.9 % (ref 39.0–52.0)
Hemoglobin: 13.4 g/dL (ref 13.0–17.0)
Lymphocytes Relative: 37 %
Lymphs Abs: 2.4 10*3/uL (ref 0.7–4.0)
MCH: 29.5 pg (ref 26.0–34.0)
MCHC: 32.8 g/dL (ref 30.0–36.0)
MCV: 90.1 fL (ref 78.0–100.0)
Monocytes Absolute: 0.4 10*3/uL (ref 0.1–1.0)
Monocytes Relative: 6 %
Neutro Abs: 3.4 10*3/uL (ref 1.7–7.7)
Neutrophils Relative %: 52 %
Platelets: 229 10*3/uL (ref 150–400)
RBC: 4.54 MIL/uL (ref 4.22–5.81)
RDW: 13.3 % (ref 11.5–15.5)
WBC: 6.6 10*3/uL (ref 4.0–10.5)

## 2016-03-11 LAB — COMPREHENSIVE METABOLIC PANEL
ALT: 24 U/L (ref 17–63)
AST: 28 U/L (ref 15–41)
Albumin: 3.7 g/dL (ref 3.5–5.0)
Alkaline Phosphatase: 68 U/L (ref 38–126)
Anion gap: 8 (ref 5–15)
BUN: 12 mg/dL (ref 6–20)
CO2: 23 mmol/L (ref 22–32)
Calcium: 9.2 mg/dL (ref 8.9–10.3)
Chloride: 106 mmol/L (ref 101–111)
Creatinine, Ser: 0.91 mg/dL (ref 0.61–1.24)
GFR calc Af Amer: >60 mL/min (ref >60)
GFR calc non Af Amer: >60 mL/min (ref >60)
Glucose, Bld: 130 mg/dL — ABNORMAL HIGH (ref 65–99)
Potassium: 3.6 mmol/L (ref 3.5–5.1)
Sodium: 137 mmol/L (ref 135–145)
Total Bilirubin: 0.4 mg/dL (ref 0.3–1.2)
Total Protein: 7.2 g/dL (ref 6.5–8.1)

## 2016-03-11 LAB — URINALYSIS, ROUTINE W REFLEX MICROSCOPIC
Bilirubin Urine: NEGATIVE
Glucose, UA: NEGATIVE mg/dL
Hgb urine dipstick: NEGATIVE
Ketones, ur: NEGATIVE mg/dL
Leukocytes, UA: NEGATIVE
Nitrite: NEGATIVE
Protein, ur: NEGATIVE mg/dL
Specific Gravity, Urine: 1.028 (ref 1.005–1.030)
pH: 5.5 (ref 5.0–8.0)

## 2016-03-11 MED ORDER — METHOCARBAMOL 500 MG PO TABS: 500.0000 mg | ORAL_TABLET | Freq: Two times a day (BID) | ORAL | Status: DC

## 2016-03-11 MED ORDER — MELOXICAM 7.5 MG PO TABS: 7.5000 mg | ORAL_TABLET | Freq: Every day | ORAL | Status: DC

## 2016-03-11 MED ORDER — METHOCARBAMOL 500 MG PO TABS
1000.0000 mg | ORAL_TABLET | Freq: Once | ORAL | Status: DC
  Filled 2016-03-11: qty 2

## 2016-03-11 MED ORDER — KETOROLAC TROMETHAMINE 60 MG/2ML IM SOLN
60.0000 mg | Freq: Once | INTRAMUSCULAR | Status: DC
  Filled 2016-03-11: qty 2

## 2016-03-11 NOTE — ED Notes (Signed)
C-collar removed, okay by MD

## 2016-03-11 NOTE — ED Provider Notes (Signed)
CSN: [id number]     Arrival date & time 03/10/16  2327 History   By signing my name below, I, Wesley [name]. [name], attest that this documentation has been prepared under the direction and in the presence of April Palumbo, MD.  Electronically Signed: [name]. [name], ED Scribe. 03/11/2016. 12:56 AM.   Chief Complaint  Patient presents with  . Fall   Patient is a 50 y.o. male presenting with fall. The history is provided by the patient. No language interpreter was used.  Fall This is a new problem. The current episode started 6 to 12 hours ago. The problem occurs constantly. The problem has not changed since onset.Pertinent negatives include no chest pain, no abdominal pain and no shortness of breath. Nothing aggravates the symptoms. Nothing relieves the symptoms. He has tried nothing for the symptoms. The treatment provided no relief.    HPI Comments: Wesley [name] is a 50 y.o. male without any pertinent past medical history who presents to the Emergency Department here for a fall sustained at approximately 2:00 PM this afternoon. Pt states he fell through the roof measuring approximately 8 feet in height landing on some plywood. No LOC reported. He now c/o constant, unchanged neck pain and pain to the head. No aggravating or alleviating factors at this time. No OTC medications or home remedies attempted prior to arrival. No recent fever, chills, nausea, vomiting, abdominal pain, back pain, or chest pain. No known allergies to medications.  PCP: Default, Provider, MD    Past Medical History  Diagnosis Date  . Hypertension    History reviewed. No pertinent past surgical history. Family History  Problem Relation Age of Onset  . Hypertension Mother   . Hypertension Father    Social History  Substance Use Topics  . Smoking status: Never Smoker   . Smokeless tobacco: None  . Alcohol Use: Yes     Comment: daily- about 5 beers a day    Review of Systems  Constitutional: Negative for fever  and chills.  Respiratory: Negative for shortness of breath.   Cardiovascular: Negative for chest pain.  Gastrointestinal: Negative for nausea, vomiting and abdominal pain.  Musculoskeletal: Positive for arthralgias and neck pain.  Psychiatric/Behavioral: Negative for confusion.  All other systems reviewed and are negative.     Allergies  Review of patient's allergies indicates no known allergies.  Home Medications   Prior to Admission medications   Medication Sig Start Date End Date Taking? Authorizing Provider  azithromycin (ZITHROMAX) 250 MG tablet Take 1 tablet (250 mg total) by mouth daily. Take first 2 tablets together, then 1 every day until finished. Patient not taking: Reported on 03/11/2016 12/19/15   Elson Areas, PA-C  benzonatate (TESSALON) 100 MG capsule Take 1 capsule (100 mg total) by mouth every 8 (eight) hours. Patient not taking: Reported on 03/11/2016 12/19/15   Elson Areas, PA-C   Triage Vitals: BP 129/85 mmHg  Pulse 84  Temp(Src) 98.4 F (36.9 C) (Oral)  Resp 20  Ht  (1.651 m)  Wt 190 lb 3 oz (86.268 kg)  BMI 31.65 kg/m2  SpO2 97%   Physical Exam  Constitutional: He is oriented to person, place, and time. He appears well-developed and well-nourished.  HENT:  Head: Normocephalic and atraumatic. Head is without raccoon's eyes and without Battle's sign.  Right Ear: No hemotympanum.  Left Ear: No hemotympanum.  Mouth/Throat: Oropharynx is clear and moist.  Eyes: EOM are normal. Pupils are equal, round, and reactive to light.  Neck: Normal range of motion.  Lateral No stepoffs of crepitus of the C spine  Cardiovascular: Normal rate, regular rhythm, normal heart sounds and intact distal pulses.   Pulmonary/Chest: Effort normal and breath sounds normal. No respiratory distress. He has no wheezes. He has no rales.  Abdominal: Soft. Bowel sounds are normal. He exhibits no distension. There is no tenderness. There is no rebound and no guarding.   Musculoskeletal: Normal range of motion.  No point tenderness or crepitus of the L or T spine Pelvis is stable  Neurological: He is alert and oriented to person, place, and time. He has normal reflexes.  5/5 strength to all extremities  Skin: Skin is warm and dry.  Psychiatric: He has a normal mood and affect. Judgment normal.  Nursing note and vitals reviewed.   ED Course  Procedures (including critical care time)  DIAGNOSTIC STUDIES: Oxygen Saturation is 97% on RA, Normal by my interpretation.    COORDINATION OF CARE: 12:49 AM- Will order imaging, blood work, and urinalysis. Discussed treatment plan with pt at bedside and pt agreed to plan.     2:10 AM- Reviewed imaging results which all came back normal. Will give Toradol and Robaxin.  Labs Review Labs Reviewed  COMPREHENSIVE METABOLIC PANEL - Abnormal; Notable for the following:    Glucose, Bld 130 (*)    All other components within normal limits  CBC WITH DIFFERENTIAL/PLATELET  URINALYSIS, ROUTINE W REFLEX MICROSCOPIC (NOT AT [facility])    Imaging Review Dg Chest 2 View  03/11/2016  CLINICAL DATA:  50 year old male with fall and right-sided pain EXAM: CHEST  2 VIEW COMPARISON:  Chest radiograph dated 12/19/2015 FINDINGS: The heart size and mediastinal contours are within normal limits. Both lungs are clear. The visualized skeletal structures are unremarkable. IMPRESSION: No acute/traumatic thoracic pathology. Electronically Signed   By: Elgie Collard M.D.   On: 03/11/2016 01:46   Dg Lumbar Spine Complete  03/11/2016  CLINICAL DATA:  Right lower back pain after falling today at 14:00 EXAM: LUMBAR SPINE - COMPLETE 4+ VIEW COMPARISON:  None. FINDINGS: Mild lower lumbar degenerative disc changes, greater at L5-S1. No bone lesion or bony destruction. IMPRESSION: Negative for acute lumbar spine fracture Electronically Signed   By: Ellery Plunk M.D.   On: 03/11/2016 01:39   Ct Head Wo Contrast  03/11/2016  CLINICAL DATA:   50 year old male with fall and headache and neck pain. EXAM: CT HEAD WITHOUT CONTRAST CT CERVICAL SPINE WITHOUT CONTRAST TECHNIQUE: Multidetector CT imaging of the head and cervical spine was performed following the standard protocol without intravenous contrast. Multiplanar CT image reconstructions of the cervical spine were also generated. COMPARISON:  C-spine radiograph dated 03/16/2010 FINDINGS: CT HEAD FINDINGS The ventricles and the sulci are appropriate in size for the patient's age. There is no intracranial hemorrhage. No midline shift or mass effect identified. The gray-white matter differentiation is preserved. An 11 x 6 mm focal hypodensity in the undersurface of the right frontal lobe (series 2, image 11) may represent volume averaging artifact versus a small old infarct. The visualized paranasal sinuses and mastoid air cells are well aerated. The calvarium is intact. CT CERVICAL SPINE FINDINGS There is no acute fracture or subluxation of the cervical spine.Mild degenerative changes.The odontoid and spinous processes are intact.There is normal anatomic alignment of the C1-C2 lateral masses. The visualized soft tissues appear unremarkable. IMPRESSION: No acute intracranial pathology. No acute/ traumatic cervical spine pathology. Electronically Signed   By: Elgie Collard M.D.   On:  03/11/2016 01:42   Ct Cervical Spine Wo Contrast  03/11/2016  CLINICAL DATA:  50 year old male with fall and headache and neck pain. EXAM: CT HEAD WITHOUT CONTRAST CT CERVICAL SPINE WITHOUT CONTRAST TECHNIQUE: Multidetector CT imaging of the head and cervical spine was performed following the standard protocol without intravenous contrast. Multiplanar CT image reconstructions of the cervical spine were also generated. COMPARISON:  C-spine radiograph dated 03/16/2010 FINDINGS: CT HEAD FINDINGS The ventricles and the sulci are appropriate in size for the patient's age. There is no intracranial hemorrhage. No midline shift or  mass effect identified. The gray-white matter differentiation is preserved. An 11 x 6 mm focal hypodensity in the undersurface of the right frontal lobe (series 2, image 11) may represent volume averaging artifact versus a small old infarct. The visualized paranasal sinuses and mastoid air cells are well aerated. The calvarium is intact. CT CERVICAL SPINE FINDINGS There is no acute fracture or subluxation of the cervical spine.Mild degenerative changes.The odontoid and spinous processes are intact.There is normal anatomic alignment of the C1-C2 lateral masses. The visualized soft tissues appear unremarkable. IMPRESSION: No acute intracranial pathology. No acute/ traumatic cervical spine pathology. Electronically Signed   By: Elgie CollardArash  Radparvar M.D.   On: 03/11/2016 01:42   I have personally reviewed and evaluated these images and lab results as part of my medical decision-making.   EKG Interpretation None      MDM   Final diagnoses:  None      Results for orders placed or performed during the hospital encounter of 03/11/16  CBC with Differential  Result Value Ref Range   WBC 6.6 4.0 - 10.5 K/uL   RBC 4.54 4.22 - 5.81 MIL/uL   Hemoglobin 13.4 13.0 - 17.0 g/dL   HCT 16.140.9 09.639.0 - 04.552.0 %   MCV 90.1 78.0 - 100.0 fL   MCH 29.5 26.0 - 34.0 pg   MCHC 32.8 30.0 - 36.0 g/dL   RDW 40.913.3 81.111.5 - 91.415.5 %   Platelets 229 150 - 400 K/uL   Neutrophils Relative % 52 %   Neutro Abs 3.4 1.7 - 7.7 K/uL   Lymphocytes Relative 37 %   Lymphs Abs 2.4 0.7 - 4.0 K/uL   Monocytes Relative 6 %   Monocytes Absolute 0.4 0.1 - 1.0 K/uL   Eosinophils Relative 5 %   Eosinophils Absolute 0.3 0.0 - 0.7 K/uL   Basophils Relative 0 %   Basophils Absolute 0.0 0.0 - 0.1 K/uL  Comprehensive metabolic panel  Result Value Ref Range   Sodium 137 135 - 145 mmol/L   Potassium 3.6 3.5 - 5.1 mmol/L   Chloride 106 101 - 111 mmol/L   CO2 23 22 - 32 mmol/L   Glucose, Bld 130 (H) 65 - 99 mg/dL   BUN 12 6 - 20 mg/dL    Creatinine, Ser 7.820.91 0.61 - 1.24 mg/dL   Calcium 9.2 8.9 - 95.610.3 mg/dL   Total Protein 7.2 6.5 - 8.1 g/dL   Albumin 3.7 3.5 - 5.0 g/dL   AST 28 15 - 41 U/L   ALT 24 17 - 63 U/L   Alkaline Phosphatase 68 38 - 126 U/L   Total Bilirubin 0.4 0.3 - 1.2 mg/dL   GFR calc non Af Amer >60 >60 mL/min   GFR calc Af Amer >60 >60 mL/min   Anion gap 8 5 - 15  Urinalysis, Routine w reflex microscopic (not [facility])  Result Value Ref Range   Color, Urine YELLOW YELLOW  APPearance CLEAR CLEAR   Specific Gravity, Urine 1.028 1.005 - 1.030   pH 5.5 5.0 - 8.0   Glucose, UA NEGATIVE NEGATIVE mg/dL   Hgb urine dipstick NEGATIVE NEGATIVE   Bilirubin Urine NEGATIVE NEGATIVE   Ketones, ur NEGATIVE NEGATIVE mg/dL   Protein, ur NEGATIVE NEGATIVE mg/dL   Nitrite NEGATIVE NEGATIVE   Leukocytes, UA NEGATIVE NEGATIVE    Medications  ketorolac (TORADOL) injection 60 mg (not administered)  methocarbamol (ROBAXIN) tablet 1,000 mg (not administered)    No fractures,  Well appearing denies pain anywhere but head and neck. Stable for discharge at this time with strict return precautions will give RX for NSAIDS and muscle relaxants   April Palumbo, MD 03/11/16 [phone]

## 2016-03-11 NOTE — ED Notes (Signed)
Patient able to ambulate independently  

## 2016-04-01 ENCOUNTER — Emergency Department (HOSPITAL_COMMUNITY)
Admission: EM | Admit: 2016-04-01 | Discharge: 2016-04-01 | Disposition: A | Discharge status: Home / Self Care | Payer: Self-pay | Attending: Physician Assistant | Admitting: Physician Assistant

## 2016-04-01 ENCOUNTER — Encounter (HOSPITAL_COMMUNITY): Payer: Self-pay

## 2016-04-01 ENCOUNTER — Emergency Department (HOSPITAL_COMMUNITY): Payer: Self-pay

## 2016-04-01 DIAGNOSIS — I10 Essential (primary) hypertension: Secondary | ICD-10-CM | POA: Insufficient documentation

## 2016-04-01 DIAGNOSIS — M778 Other enthesopathies, not elsewhere classified: Secondary | ICD-10-CM | POA: Insufficient documentation

## 2016-04-01 DIAGNOSIS — M25532 Pain in left wrist: Secondary | ICD-10-CM

## 2016-04-01 DIAGNOSIS — M779 Enthesopathy, unspecified: Secondary | ICD-10-CM

## 2016-04-01 MED ORDER — NAPROXEN 500 MG PO TABS: 500.0000 mg | ORAL_TABLET | Freq: Two times a day (BID) | ORAL | Status: DC

## 2016-04-01 MED ORDER — TRAMADOL HCL 50 MG PO TABS: 50.0000 mg | ORAL_TABLET | Freq: Four times a day (QID) | ORAL | Status: DC | PRN

## 2016-04-01 NOTE — ED Provider Notes (Signed)
History  By signing my name below, I, [name], attest that this documentation has been prepared under the direction and in the presence of Tatyana Kirichenko, PA-C. Electronically Signed: [name], ED Scribe. 04/01/2016. 11:10 AM.  Chief Complaint  Patient presents with  . Wrist Pain   The history is provided by the patient and medical records. No language interpreter was used.    HPI Comments:  Wesley [name] is a 50 y.o. male who presents to the Emergency Department complaining of left wrist pain that began last night. He states the pain worsened this morning. He reports associated swelling of the left wrist. He has not taken anything for pain. Touching the area or trying to move it increases the pain. He denies alleviating factors. He denies any trauma, injury or fall. He denies lifting any heavy objects or injuring the wrist in the past. He denies numbness, tingling or weakness of the left hand, wrist or arm, bruising, wounds.   Past Medical History  Diagnosis Date  . Hypertension    History reviewed. No pertinent past surgical history. Family History  Problem Relation Age of Onset  . Hypertension Mother   . Hypertension Father    Social History  Substance Use Topics  . Smoking status: Never Smoker   . Smokeless tobacco: None  . Alcohol Use: Yes     Comment: daily- about 5 beers a day    Review of Systems  Musculoskeletal: Positive for joint swelling and arthralgias.  Skin: Negative for color change and wound.  Neurological: Negative for weakness and numbness.    Allergies  Review of patient's allergies indicates no known allergies.  Home Medications   Prior to Admission medications   Medication Sig Start Date End Date Taking? Authorizing Provider  azithromycin (ZITHROMAX) 250 MG tablet Take 1 tablet (250 mg total) by mouth daily. Take first 2 tablets together, then 1 every day until finished. Patient not taking: Reported on 03/11/2016 12/19/15   Elson Areas, PA-C  benzonatate (TESSALON) 100 MG capsule Take 1 capsule (100 mg total) by mouth every 8 (eight) hours. Patient not taking: Reported on 03/11/2016 12/19/15   Elson Areas, PA-C  meloxicam (MOBIC) 7.5 MG tablet Take 1 tablet (7.5 mg total) by mouth daily. 03/11/16   April Palumbo, MD  methocarbamol (ROBAXIN) 500 MG tablet Take 1 tablet (500 mg total) by mouth 2 (two) times daily. 03/11/16   April Palumbo, MD   Triage Vitals: BP 142/103 mmHg  Pulse 105  Temp(Src) 98.9 F (37.2 C) (Oral)  Resp 20  SpO2 97% Physical Exam  Constitutional: He is oriented to person, place, and time. He appears well-developed and well-nourished.  HENT:  Head: Normocephalic and atraumatic.  Eyes: EOM are normal.  Neck: Normal range of motion.  Cardiovascular: Normal rate.   Pulmonary/Chest: Effort normal.  Musculoskeletal: Normal range of motion.  Normal-appearing left wrist with no evidence of swelling, erythema, warmth to the touch. Tender to palpation over dorsal wrist joint. Pain with flexion and extension of the wrist. Full range of motion of all fingers. Normal elbow. Distal radial pulses intact.  Neurological: He is alert and oriented to person, place, and time.  Skin: Skin is warm and dry.  Psychiatric: He has a normal mood and affect. His behavior is normal.  Nursing note and vitals reviewed.   ED Course  Procedures (including critical care time) DIAGNOSTIC STUDIES: Oxygen Saturation is 97% on RA, normal by my interpretation.   COORDINATION OF CARE: 10:50 AM-  Will wait for imaging to result. Pt verbalizes understanding and agrees to plan.  Medications - No data to display  Labs Review Labs Reviewed - No data to display  Imaging Review Dg Wrist Complete Left  04/01/2016  CLINICAL DATA:  Left wrist pain. EXAM: LEFT WRIST - COMPLETE 3+ VIEW COMPARISON:  None. FINDINGS: There is no evidence of fracture or dislocation. Mild degenerative disease involving the radiocarpal joint and the first  [facility] joint. No bony lesions or erosions. Soft tissues are unremarkable. IMPRESSION: No acute findings. Electronically Signed   By: Irish Lack M.D.   On: 04/01/2016 11:04   I have personally reviewed and evaluated these images and lab results as part of my medical decision-making.   EKG Interpretation None      MDM   Final diagnoses:  Left wrist pain  Tendonitis   Patient in emergency department with pain to the left wrist. No trauma. 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 He has no evidence of infectious joint. No history of gout. There is no numbness or tingling to the fingers, neurovascularly intact. X-rays negative. Exam is most consistent with tendinitis of the extensor tendons. We'll place in a Velcro splint. Home with ice, elevation, naproxen, tramadol. Follow-up with primary care doctor  Filed Vitals:   04/01/16 1024  BP: 142/103  Pulse: 105  Temp: 98.9 F (37.2 C)  TempSrc: Oral  Resp: 20  SpO2: 97%    I personally performed the services described in this documentation, which was scribed in my presence. The recorded information has been reviewed and is accurate.   Jaynie Crumble, PA-C 04/01/16 [address] PA-C 04/01/16 1118  Courteney Randall An, MD 04/01/16 1610

## 2016-04-01 NOTE — ED Notes (Signed)
Patient here with left wrist pain since last pm, denies trauma. Wrist swelling noted on arrival, positive distal pulse.

## 2016-04-01 NOTE — Discharge Instructions (Signed)
Naprosyn for pain and inflammation. Tramadol for severe pain. Ice and elevate wrist. Splint for at least a week. Follow up with primary care doctor or return if not improving.   Wrist Pain There are many things that can cause wrist pain. Some common causes include:  An injury to the wrist area, such as a sprain, strain, or fracture.  Overuse of the joint.  A condition that causes increased pressure on a nerve in the wrist (carpal tunnel syndrome).  Wear and tear of the joints that occurs with aging (osteoarthritis).  A variety of other types of arthritis. Sometimes, the cause of wrist pain is not known. The pain often goes away when you follow your health care provider's instructions for relieving pain at home. If your wrist pain continues, tests may need to be done to diagnose your condition. HOME CARE INSTRUCTIONS Pay attention to any changes in your symptoms. Take these actions to help with your pain:  Rest the wrist area for at least 48 hours or as told by your health care provider.  If directed, apply ice to the injured area:  Put ice in a plastic bag.  Place a towel between your skin and the bag.  Leave the ice on for 20 minutes, 2-3 times per day.  Keep your arm raised (elevated) above the level of your heart while you are sitting or lying down.  If a splint or elastic bandage has been applied, use it as told by your health care provider.  Remove the splint or bandage only as told by your health care provider.  Loosen the splint or bandage if your fingers become numb or have a tingling feeling, or if they turn cold or blue.  Take over-the-counter and prescription medicines only as told by your health care provider.  Keep all follow-up visits as told by your health care provider. This is important. SEEK MEDICAL CARE IF:  Your pain is not helped by treatment.  Your pain gets worse. SEEK IMMEDIATE MEDICAL CARE IF:  Your fingers become swollen.  Your fingers turn  white, very red, or cold and blue.  Your fingers are numb or have a tingling feeling.  You have difficulty moving your fingers.   This information is not intended to replace advice given to you by your health care provider. Make sure you discuss any questions you have with your health care provider.   Document Released: 06/07/2005 Document Revised: 05/19/2015 Document Reviewed: 01/13/2015 Elsevier Interactive Patient Education [employer]

## 2016-04-01 NOTE — ED Notes (Signed)
Pt able to wiggle fingers.

## 2016-05-07 ENCOUNTER — Encounter (HOSPITAL_COMMUNITY): Payer: Self-pay | Admitting: Emergency Medicine

## 2016-05-07 ENCOUNTER — Emergency Department (HOSPITAL_COMMUNITY)
Admission: EM | Admit: 2016-05-07 | Discharge: 2016-05-07 | Disposition: A | Discharge status: Home / Self Care | Payer: Self-pay | Attending: Emergency Medicine | Admitting: Emergency Medicine

## 2016-05-07 ENCOUNTER — Emergency Department (HOSPITAL_COMMUNITY): Payer: Self-pay

## 2016-05-07 DIAGNOSIS — R0789 Other chest pain: Secondary | ICD-10-CM | POA: Insufficient documentation

## 2016-05-07 DIAGNOSIS — R079 Chest pain, unspecified: Secondary | ICD-10-CM

## 2016-05-07 DIAGNOSIS — F1410 Cocaine abuse, uncomplicated: Secondary | ICD-10-CM

## 2016-05-07 DIAGNOSIS — I10 Essential (primary) hypertension: Secondary | ICD-10-CM | POA: Insufficient documentation

## 2016-05-07 DIAGNOSIS — Z79899 Other long term (current) drug therapy: Secondary | ICD-10-CM | POA: Insufficient documentation

## 2016-05-07 LAB — RAPID URINE DRUG SCREEN, HOSP PERFORMED
Amphetamines: NOT DETECTED
Barbiturates: NOT DETECTED
Benzodiazepines: NOT DETECTED
Cocaine: POSITIVE — AB
Opiates: NOT DETECTED
Tetrahydrocannabinol: NOT DETECTED

## 2016-05-07 LAB — CBC
HCT: 44.5 % (ref 39.0–52.0)
Hemoglobin: 14.8 g/dL (ref 13.0–17.0)
MCH: 29.5 pg (ref 26.0–34.0)
MCHC: 33.3 g/dL (ref 30.0–36.0)
MCV: 88.8 fL (ref 78.0–100.0)
Platelets: 265 10*3/uL (ref 150–400)
RBC: 5.01 MIL/uL (ref 4.22–5.81)
RDW: 12.8 % (ref 11.5–15.5)
WBC: 6.8 10*3/uL (ref 4.0–10.5)

## 2016-05-07 LAB — BASIC METABOLIC PANEL
Anion gap: 11 (ref 5–15)
BUN: 8 mg/dL (ref 6–20)
CO2: 22 mmol/L (ref 22–32)
Calcium: 10.1 mg/dL (ref 8.9–10.3)
Chloride: 102 mmol/L (ref 101–111)
Creatinine, Ser: 0.83 mg/dL (ref 0.61–1.24)
GFR calc Af Amer: >60 mL/min (ref >60)
GFR calc non Af Amer: >60 mL/min (ref >60)
Glucose, Bld: 108 mg/dL — ABNORMAL HIGH (ref 65–99)
Potassium: 3.7 mmol/L (ref 3.5–5.1)
Sodium: 135 mmol/L (ref 135–145)

## 2016-05-07 LAB — I-STAT TROPONIN, ED
Troponin i, poc: 0 ng/mL (ref 0.00–0.08)
Troponin i, poc: 0 ng/mL (ref 0.00–0.08)

## 2016-05-07 MED ORDER — FAMOTIDINE IN NACL 20-0.9 MG/50ML-% IV SOLN
20.0000 mg | Freq: Two times a day (BID) | INTRAVENOUS | Status: DC
  Administered 2016-05-07: 20 mg via INTRAVENOUS
  Filled 2016-05-07: qty 50

## 2016-05-07 MED ORDER — SODIUM CHLORIDE 0.9 % IV BOLUS (SEPSIS)
1000.0000 mL | Freq: Once | INTRAVENOUS | Status: AC
  Administered 2016-05-07: 1000 mL via INTRAVENOUS

## 2016-05-07 MED ORDER — LORAZEPAM 2 MG/ML IJ SOLN
1.0000 mg | Freq: Once | INTRAMUSCULAR | Status: AC
  Administered 2016-05-07: 1 mg via INTRAVENOUS
  Filled 2016-05-07: qty 1

## 2016-05-07 NOTE — ED Provider Notes (Signed)
[facility]-EMERGENCY DEPT Provider Note   CSN: [id number] Arrival date & time: 05/07/16  1032     History   Chief Complaint Chief Complaint  Patient presents with  . Chest Pain    HPI Wesley [name] is a 50 y.o. male.  HPI  Patient presents with chest pain which started last night.  This is following drinking beer and snorting cocaine.  Has some nausea but no vomiting this morning.  Patient has history of hypertension.  Denies diaphoresis.  Denies syncope.  Pain is better but still present.  Patient also had some palpitations which seem to be getting better. Past Medical History:  Diagnosis Date  . Hypertension     There are no active problems to display for this patient.   History reviewed. No pertinent surgical history.     Home Medications    Prior to Admission medications   Medication Sig Start Date End Date Taking? Authorizing Provider  azithromycin (ZITHROMAX) 250 MG tablet Take 1 tablet (250 mg total) by mouth daily. Take first 2 tablets together, then 1 every day until finished. Patient not taking: Reported on 03/11/2016 12/19/15   Elson Areas, PA-C  benzonatate (TESSALON) 100 MG capsule Take 1 capsule (100 mg total) by mouth every 8 (eight) hours. Patient not taking: Reported on 03/11/2016 12/19/15   Elson Areas, PA-C  meloxicam (MOBIC) 7.5 MG tablet Take 1 tablet (7.5 mg total) by mouth daily. Patient not taking: Reported on 05/07/2016 03/11/16   April Palumbo, MD  methocarbamol (ROBAXIN) 500 MG tablet Take 1 tablet (500 mg total) by mouth 2 (two) times daily. Patient not taking: Reported on 05/07/2016 03/11/16   April Palumbo, MD  naproxen (NAPROSYN) 500 MG tablet Take 1 tablet (500 mg total) by mouth 2 (two) times daily. Patient not taking: Reported on 05/07/2016 04/01/16   Jaynie Crumble, PA-C  traMADol (ULTRAM) 50 MG tablet Take 1 tablet (50 mg total) by mouth every 6 (six) hours as needed. Patient not taking: Reported on 05/07/2016 04/01/16   Jaynie Crumble, PA-C     Family History Family History  Problem Relation Age of Onset  . Hypertension Mother   . Hypertension Father     Social History Social History  Substance Use Topics  . Smoking status: Never Smoker  . Smokeless tobacco: Not on file  . Alcohol use Yes     Comment: daily- about 5 beers a day     Allergies   Review of patient's allergies indicates no known allergies.   Review of Systems Review of Systems All other systems reviewed and are negative  Physical Exam Updated Vital Signs BP 133/91 (BP Location: Right Arm)   Pulse 95   Temp 98.1 F (36.7 C) (Oral)   Resp 18   Ht 5\' 3"  (1.6 m)   Wt 190 lb (86.2 kg)   SpO2 99%   BMI 33.66 kg/m   Physical Exam Physical Exam  Nursing note and vitals reviewed. Constitutional: He is oriented to person, place, and time. He appears well-developed and well-nourished. No distress.  HENT:  Head: Normocephalic and atraumatic.  Eyes: Pupils are equal, round, and reactive to light.  Neck: Normal range of motion.  Cardiovascular: Normal rate and intact distal pulses.   Pulmonary/Chest: No respiratory distress.  Abdominal: Normal appearance. He exhibits no distension.  Musculoskeletal: Normal range of motion.  Neurological: He is alert and oriented to person, place, and time. No cranial nerve deficit.  Skin: Skin is warm and dry. No rash noted.  Psychiatric: He has a normal mood and affect. His behavior is normal.    ED Treatments / Results  Labs (all labs ordered are listed, but only abnormal results are displayed) Labs Reviewed  BASIC METABOLIC PANEL - Abnormal; Notable for the following:       Result Value   Glucose, Bld 108 (*)    All other components within normal limits  URINE RAPID DRUG SCREEN, HOSP PERFORMED - Abnormal; Notable for the following:    Cocaine POSITIVE (*)    All other components within normal limits  CBC  I-STAT TROPOININ, ED  I-STAT TROPOININ, ED    EKG  EKG  Interpretation  Date/Time:  Sunday May 07 2016 10:43:36 EDT Ventricular Rate:  110 PR Interval:  126 QRS Duration: 96 QT Interval:  338 QTC Calculation: 457 R Axis:   -51 Text Interpretation:  Sinus tachycardia Left axis deviation Left ventricular hypertrophy Possible Lateral infarct , age undetermined Abnormal ECG No significant change since last tracing Confirmed by BEATON  MD, ROBERT ([id number]) on 05/07/2016 11:47:41 AM       Radiology Dg Chest 2 View  Result Date: 05/07/2016 CLINICAL DATA:  Pt states midsternum chest pain x this morning. Hx of HTN. Non-smoker. EXAM: CHEST - 2 VIEW COMPARISON:  03/11/2016 FINDINGS: Low lung volumes. Prominent perihilar and bibasilar interstitial markings as before. No confluent airspace disease. Heart size and mediastinal contours are within normal limits. No pneumothorax or effusion. Visualized bones unremarkable. IMPRESSION: 1. Chronic perihilar and bibasilar interstitial changes. No acute abnormality. Electronically Signed   By: Corlis Leak  Hassell M.D.   On: 05/07/2016 11:24    Procedures Procedures (including critical care time)  Medications Ordered in ED Medications  sodium chloride 0.9 % bolus 1,000 mL (0 mLs Intravenous Stopped 05/07/16 1440)  LORazepam (ATIVAN) injection 1 mg (1 mg Intravenous Given 05/07/16 1309)     Initial Impression / Assessment and Plan / ED Course  I have reviewed the triage vital signs and the nursing notes.  Pertinent labs & imaging results that were available during my care of the patient were reviewed by me and considered in my medical decision making (see chart for details).  Clinical Course      Final Clinical Impressions(s) / ED Diagnoses   Final diagnoses:  Nonspecific chest pain  Cocaine abuse    New Prescriptions New Prescriptions   No medications on file     Nelva Nayobert Beaton, MD 05/07/16 [id number]

## 2016-05-07 NOTE — ED Triage Notes (Signed)
Pt states while laying in bed at 0300 he had a sudden onset of central chest pressure and sob. Pt still currently having pressure in center of his chest. Pt is warm and dry.

## 2016-05-07 NOTE — ED Notes (Addendum)
Pt states that he drinks 2-3 beers every morning "to keep from getting shakes" then drinks 12 pack beer every day after work. Admits to using cocaine last evening-- then started feeling "nervous".

## 2016-06-23 ENCOUNTER — Emergency Department (HOSPITAL_COMMUNITY)
Admission: EM | Admit: 2016-06-23 | Discharge: 2016-06-23 | Disposition: A | Discharge status: Home / Self Care | Payer: Self-pay | Attending: Dermatology | Admitting: Dermatology

## 2016-06-23 ENCOUNTER — Emergency Department (HOSPITAL_COMMUNITY)
Admission: EM | Admit: 2016-06-23 | Discharge: 2016-06-23 | Disposition: A | Discharge status: Home / Self Care | Payer: Self-pay | Attending: Emergency Medicine | Admitting: Emergency Medicine

## 2016-06-23 ENCOUNTER — Encounter (HOSPITAL_COMMUNITY): Payer: Self-pay

## 2016-06-23 ENCOUNTER — Encounter (HOSPITAL_COMMUNITY): Payer: Self-pay | Admitting: *Deleted

## 2016-06-23 DIAGNOSIS — I10 Essential (primary) hypertension: Secondary | ICD-10-CM | POA: Insufficient documentation

## 2016-06-23 DIAGNOSIS — Z5321 Procedure and treatment not carried out due to patient leaving prior to being seen by health care provider: Secondary | ICD-10-CM | POA: Insufficient documentation

## 2016-06-23 DIAGNOSIS — R079 Chest pain, unspecified: Secondary | ICD-10-CM | POA: Insufficient documentation

## 2016-06-23 LAB — CBC
HCT: 40 % (ref 39.0–52.0)
HCT: 41.4 % (ref 39.0–52.0)
Hemoglobin: 13.3 g/dL (ref 13.0–17.0)
Hemoglobin: 13.8 g/dL (ref 13.0–17.0)
MCH: 28.7 pg (ref 26.0–34.0)
MCH: 28.8 pg (ref 26.0–34.0)
MCHC: 33.3 g/dL (ref 30.0–36.0)
MCHC: 33.3 g/dL (ref 30.0–36.0)
MCV: 86.4 fL (ref 78.0–100.0)
MCV: 86.3 fL (ref 78.0–100.0)
Platelets: 244 10*3/uL (ref 150–400)
Platelets: 241 10*3/uL (ref 150–400)
RBC: 4.63 MIL/uL (ref 4.22–5.81)
RBC: 4.8 MIL/uL (ref 4.22–5.81)
RDW: 12.8 % (ref 11.5–15.5)
RDW: 12.6 % (ref 11.5–15.5)
WBC: 6.5 10*3/uL (ref 4.0–10.5)
WBC: 7 10*3/uL (ref 4.0–10.5)

## 2016-06-23 LAB — BASIC METABOLIC PANEL
Anion gap: 10 (ref 5–15)
Anion gap: 9 (ref 5–15)
BUN: 10 mg/dL (ref 6–20)
BUN: 8 mg/dL (ref 6–20)
CO2: 26 mmol/L (ref 22–32)
CO2: 25 mmol/L (ref 22–32)
Calcium: 9.3 mg/dL (ref 8.9–10.3)
Calcium: 9.3 mg/dL (ref 8.9–10.3)
Chloride: 101 mmol/L (ref 101–111)
Chloride: 104 mmol/L (ref 101–111)
Creatinine, Ser: 0.83 mg/dL (ref 0.61–1.24)
Creatinine, Ser: 0.75 mg/dL (ref 0.61–1.24)
GFR calc Af Amer: >60 mL/min (ref >60)
GFR calc Af Amer: >60 mL/min (ref >60)
GFR calc non Af Amer: >60 mL/min (ref >60)
GFR calc non Af Amer: >60 mL/min (ref >60)
Glucose, Bld: 113 mg/dL — ABNORMAL HIGH (ref 65–99)
Glucose, Bld: 103 mg/dL — ABNORMAL HIGH (ref 65–99)
Potassium: 3.4 mmol/L — ABNORMAL LOW (ref 3.5–5.1)
Potassium: 3.9 mmol/L (ref 3.5–5.1)
Sodium: 137 mmol/L (ref 135–145)
Sodium: 138 mmol/L (ref 135–145)

## 2016-06-23 LAB — I-STAT TROPONIN, ED
Troponin i, poc: 0 ng/mL (ref 0.00–0.08)
Troponin i, poc: 0 ng/mL (ref 0.00–0.08)

## 2016-06-23 NOTE — ED Triage Notes (Signed)
Pt c/o central non radiating chest pain starting 45 minutes ago while pt was sleeping. Pt denies any other symptoms associated with the pain. Pt has not taken and medication for the pain.

## 2016-06-23 NOTE — ED Notes (Signed)
Called x4 a room in the back to doctor no answer

## 2016-06-23 NOTE — ED Notes (Signed)
Pt no answer x 3 

## 2016-06-23 NOTE — ED Notes (Signed)
Called multiple times for xray with no answer and checked triage without finding him

## 2016-06-23 NOTE — ED Triage Notes (Signed)
Per Pt, Pt is coming from home with complaints of headache, chest pain, and some nausea that started to day. Complains that his body "feels hot"

## 2016-07-03 ENCOUNTER — Emergency Department (HOSPITAL_COMMUNITY)
Admission: EM | Admit: 2016-07-03 | Discharge: 2016-07-03 | Disposition: A | Discharge status: Home / Self Care | Payer: Self-pay | Attending: Emergency Medicine | Admitting: Emergency Medicine

## 2016-07-03 ENCOUNTER — Encounter (HOSPITAL_COMMUNITY): Payer: Self-pay | Admitting: Emergency Medicine

## 2016-07-03 ENCOUNTER — Emergency Department (HOSPITAL_COMMUNITY): Payer: Self-pay

## 2016-07-03 DIAGNOSIS — F1010 Alcohol abuse, uncomplicated: Secondary | ICD-10-CM | POA: Insufficient documentation

## 2016-07-03 DIAGNOSIS — I10 Essential (primary) hypertension: Secondary | ICD-10-CM | POA: Insufficient documentation

## 2016-07-03 DIAGNOSIS — B86 Scabies: Secondary | ICD-10-CM | POA: Insufficient documentation

## 2016-07-03 HISTORY — DX: Cocaine abuse, uncomplicated: F14.10

## 2016-07-03 HISTORY — DX: Alcohol abuse, uncomplicated: F10.10

## 2016-07-03 LAB — COMPREHENSIVE METABOLIC PANEL
ALT: 29 U/L (ref 17–63)
AST: 28 U/L (ref 15–41)
Albumin: 4.3 g/dL (ref 3.5–5.0)
Alkaline Phosphatase: 73 U/L (ref 38–126)
Anion gap: 11 (ref 5–15)
BUN: 14 mg/dL (ref 6–20)
CO2: 24 mmol/L (ref 22–32)
Calcium: 9.5 mg/dL (ref 8.9–10.3)
Chloride: 102 mmol/L (ref 101–111)
Creatinine, Ser: 0.84 mg/dL (ref 0.61–1.24)
GFR calc Af Amer: >60 mL/min (ref >60)
GFR calc non Af Amer: >60 mL/min (ref >60)
Glucose, Bld: 110 mg/dL — ABNORMAL HIGH (ref 65–99)
Potassium: 3.5 mmol/L (ref 3.5–5.1)
Sodium: 137 mmol/L (ref 135–145)
Total Bilirubin: 0.6 mg/dL (ref 0.3–1.2)
Total Protein: 8.1 g/dL (ref 6.5–8.1)

## 2016-07-03 LAB — CBC
HCT: 43.1 % (ref 39.0–52.0)
Hemoglobin: 14.6 g/dL (ref 13.0–17.0)
MCH: 29.3 pg (ref 26.0–34.0)
MCHC: 33.9 g/dL (ref 30.0–36.0)
MCV: 86.4 fL (ref 78.0–100.0)
Platelets: 254 10*3/uL (ref 150–400)
RBC: 4.99 MIL/uL (ref 4.22–5.81)
RDW: 12.6 % (ref 11.5–15.5)
WBC: 8.4 10*3/uL (ref 4.0–10.5)

## 2016-07-03 LAB — BASIC METABOLIC PANEL
Anion gap: 12 (ref 5–15)
BUN: 14 mg/dL (ref 6–20)
CO2: 24 mmol/L (ref 22–32)
Calcium: 9.4 mg/dL (ref 8.9–10.3)
Chloride: 100 mmol/L — ABNORMAL LOW (ref 101–111)
Creatinine, Ser: 0.86 mg/dL (ref 0.61–1.24)
GFR calc Af Amer: >60 mL/min (ref >60)
GFR calc non Af Amer: >60 mL/min (ref >60)
Glucose, Bld: 118 mg/dL — ABNORMAL HIGH (ref 65–99)
Potassium: 3.6 mmol/L (ref 3.5–5.1)
Sodium: 136 mmol/L (ref 135–145)

## 2016-07-03 LAB — I-STAT TROPONIN, ED: Troponin i, poc: 0 ng/mL (ref 0.00–0.08)

## 2016-07-03 MED ORDER — PERMETHRIN 5 % EX CREA: TOPICAL_CREAM | CUTANEOUS | 1 refills | Status: DC

## 2016-07-03 NOTE — ED Triage Notes (Signed)
Pt in reporting CP and nausea that started when pt woke up, states hx of feeling "nervous"

## 2016-07-03 NOTE — ED Notes (Signed)
Pt admits to drinking 10-15 beers/day & does cocaine daily. States last use/drink was yesterday. States he feels like his body is "shaking"

## 2016-07-03 NOTE — ED Notes (Signed)
Pt to xray

## 2016-07-03 NOTE — ED Provider Notes (Signed)
[facility]-EMERG[facility]Y DEPT Provider Note   CSN: [id number] Arrival date & time: 07/03/16  0636     History   Chief Complaint Chief Complaint  Patient presents with  . Chest Pain  . Anxiety    HPI Wesley [name] is a 50 y.o. male.  HPI   The pt is a 50 y/o male - he has a hx of ETOH abuse as well as cocaine abuse - endorses 10 beers and cocaine last night - states that he awoke last night feeling shaky, stating that his head was going "up and down" and his neck was floppy and his arms and legs were uncontrollable - he was able to do everything that he wanted to do and denied vertigo but states that he thinks that it was his spine problem.  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 Both of these workups or unremarkable. The patient denies any chest pain or shortness of breath to me at this time. He denies swelling of his legs, denies trauma, denies recent travel. 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  Past Medical History:  Diagnosis Date  . Alcohol abuse   . Cocaine abuse   . Hypertension     There are no active problems to display for this patient.   History reviewed. No pertinent surgical history.     Home Medications    Prior to Admission medications   Medication Sig Start Date End Date Taking? Authorizing Provider  acetaminophen (TYLENOL) 325 MG tablet Take 650 mg by mouth every 6 (six) hours as needed for mild pain.   Yes Historical Provider, MD  ibuprofen (ADVIL,MOTRIN) 200 MG tablet Take 200 mg by mouth every 6 (six) hours as needed for mild pain.   Yes Historical Provider, MD    meloxicam (MOBIC) 7.5 MG tablet Take 1 tablet (7.5 mg total) by mouth daily. Patient not taking: Reported on 07/03/2016 03/11/16   April Palumbo, MD  naproxen (NAPROSYN) 500 MG tablet Take 1 tablet (500 mg total) by mouth 2 (two) times daily. Patient not taking: Reported on 07/03/2016 04/01/16   Jaynie Crumble, PA-C  permethrin (ELIMITE) 5 % cream Apply to affected area once Leave on for 12-14 hours, then wash off 07/03/16   Eber Hong, MD  traMADol (ULTRAM) 50 MG tablet Take 1 tablet (50 mg total) by mouth every 6 (six) hours as needed. Patient not taking: Reported on 07/03/2016 04/01/16   Jaynie Crumble, PA-C    Family History Family History  Problem Relation Age of Onset  . Hypertension Mother   . Hypertension Father     Social History Social History  Substance Use Topics  . Smoking status: Never Smoker  . Smokeless tobacco: Never Used  . Alcohol use Yes     Comment: daily- about 6 beers a day     Allergies   Review of patient's allergies indicates no known allergies.   Review of Systems Review of Systems  All other systems reviewed and are negative.    Physical Exam Updated Vital Signs BP 124/100   Pulse 109   Temp 97.5 F (36.4 C) (  Oral)   Resp 21   Ht 5\' 6"  (1.676 m)   Wt 175 lb (79.4 kg)   SpO2 100%   BMI 28.25 kg/m   Physical Exam  Constitutional: He appears well-developed and well-nourished. No distress.  Mildly anxious appearing  HENT:  Head: Normocephalic and atraumatic.  Mouth/Throat: Oropharynx is clear and moist. No oropharyngeal exudate.  Eyes: Conjunctivae and EOM are normal. Pupils are equal, round, and reactive to light. Right eye exhibits no discharge. Left eye exhibits no discharge. No scleral icterus.  No jaundice / icterus  Neck: Normal range of motion. Neck supple. No JVD present. No thyromegaly present.  Supple neck, normal strength, no LAD  Cardiovascular: Regular rhythm, normal heart sounds and intact distal pulses.  Exam  reveals no gallop and no friction rub.   No murmur heard. Tachycardia present.  No JVD, normal pulses  Pulmonary/Chest: Effort normal and breath sounds normal. No respiratory distress. He has no wheezes. He has no rales.  Abdominal: Soft. Bowel sounds are normal. He exhibits no distension and no mass. There is no tenderness.  Musculoskeletal: Normal range of motion. He exhibits no edema or tenderness.  Normal strength, sensation and coordination  Lymphadenopathy:    He has no cervical adenopathy.  Neurological: He is alert. Coordination normal.  Normal coordination and strength, normal speech, no tremor, normal FNF, normal CN 3-12  Skin: Skin is warm and dry. No rash noted. No erythema.  Warm, no diaphoresis  Psychiatric: He has a normal mood and affect. His behavior is normal.  Mildly anxious appearing - expresses desire to stop using cocaine and ETOH - denies depression  Nursing note and vitals reviewed.    ED Treatments / Results  Labs (all labs ordered are listed, but only abnormal results are displayed) Labs Reviewed  BASIC METABOLIC PANEL - Abnormal; Notable for the following:       Result Value   Chloride 100 (*)    Glucose, Bld 118 (*)    All other components within normal limits  COMPREHENSIVE METABOLIC PANEL - Abnormal; Notable for the following:    Glucose, Bld 110 (*)    All other components within normal limits  CBC  I-STAT TROPOININ, ED    EKG  EKG Interpretation  Date/Time:  Monday July 03 2016 06:43:43 EDT Ventricular Rate:  113 PR Interval:    QRS Duration: 97 QT Interval:  329 QTC Calculation: 452 R Axis:   -77 Text Interpretation:  Sinus tachycardia Ventricular premature complex Probable left atrial enlargement Left anterior fascicular block Abnormal R-wave progression, late transition Left ventricular hypertrophy since last tracing no significant change Confirmed by Hyacinth MeekerMILLER  MD, BRIAN ([id number]) on 07/03/2016 7:04:55 AM Also confirmed by Hyacinth MeekerMILLER  MD,  BRIAN ([id number]), editor [place]  CCT, BEVERLY ([id number])  on 07/03/2016 7:36:42 AM       Radiology Dg Chest 2 View  Result Date: 07/03/2016 CLINICAL DATA:  Chest pain and nausea EXAM: CHEST  2 VIEW COMPARISON:  05/07/2016 FINDINGS: Cardiac shadow is at the upper limits of normal in size. The lungs are well aerated bilaterally. No focal infiltrate or sizable effusion is seen. No acute bony abnormality is noted. IMPRESSION: No active cardiopulmonary disease. Electronically Signed   By: Alcide CleverMark  Lukens M.D.   On: 07/03/2016 07:35    Procedures Procedures (including critical care time)  Medications Ordered in ED Medications - No data to display   Initial Impression / Assessment and Plan / ED Course  I have reviewed the triage vital signs  and the nursing notes.  Pertinent labs & imaging results that were available during my care of the patient were reviewed by me and considered in my medical decision making (see chart for details).  Clinical Course  Comment By Time  REviewed labs - normal BMP other than nominally elevated glucose - VS with improved BP and pulse - has normal CBC and normal trop - his ECG is unchanged as well without signs of acute ischemia.  I suspect that the patients BP is elevated secondary to his ETOH and cocaine use as it ihas naturally improved over time - will need 2 week f/u - pt is committed to stopping his drug use / and f/u with clinic - information given for community health and wellness clinic. Eber Hong, MD 10/23 0818  On repeat exam, the pt feels better but now asks about the itching that he has on his skin - has several erythematous papulses / scabbed over and his sig other has the sam - appears c/w scabies Eber Hong, MD 10/23 [phone]   The pt does not have any CP or SOB, he states he feels that his nervous system is off - I would consider that this could be some ETOH withdrawal though this seems less likely given his frequent heavy use and cocaine use it seems  more likely substance abuse related - as he has no other signs of withdrawal.  He is not tremulous, or diaphoretic.  He has no n/v.  abd non tender, no signs of clinical liver failure.  Check labs, ECG unchanged other than tachycardia.  Final Clinical Impressions(s) / ED Diagnoses   Final diagnoses:  Alcohol abuse  Scabies    New Prescriptions New Prescriptions   PERMETHRIN (ELIMITE) 5 % CREAM    Apply to affected area once Leave on for 12-14 hours, then wash off     Eber Hong, MD 07/03/16 [phone]

## 2016-07-03 NOTE — ED Notes (Signed)
Pt. Verbalized understanding of teaching. NAD.

## 2016-07-09 ENCOUNTER — Encounter (HOSPITAL_COMMUNITY): Payer: Self-pay

## 2016-07-09 ENCOUNTER — Emergency Department (HOSPITAL_COMMUNITY)
Admission: EM | Admit: 2016-07-09 | Discharge: 2016-07-09 | Disposition: A | Discharge status: Home / Self Care | Payer: Self-pay | Attending: Emergency Medicine | Admitting: Emergency Medicine

## 2016-07-09 ENCOUNTER — Emergency Department (HOSPITAL_COMMUNITY): Payer: Self-pay

## 2016-07-09 ENCOUNTER — Emergency Department (HOSPITAL_COMMUNITY)
Admission: EM | Admit: 2016-07-09 | Discharge: 2016-07-10 | Disposition: A | Discharge status: Home / Self Care | Payer: Self-pay | Attending: Emergency Medicine | Admitting: Emergency Medicine

## 2016-07-09 DIAGNOSIS — R0789 Other chest pain: Secondary | ICD-10-CM | POA: Insufficient documentation

## 2016-07-09 DIAGNOSIS — Z79899 Other long term (current) drug therapy: Secondary | ICD-10-CM | POA: Insufficient documentation

## 2016-07-09 DIAGNOSIS — I10 Essential (primary) hypertension: Secondary | ICD-10-CM | POA: Insufficient documentation

## 2016-07-09 DIAGNOSIS — Z5321 Procedure and treatment not carried out due to patient leaving prior to being seen by health care provider: Secondary | ICD-10-CM | POA: Insufficient documentation

## 2016-07-09 DIAGNOSIS — R079 Chest pain, unspecified: Secondary | ICD-10-CM | POA: Insufficient documentation

## 2016-07-09 LAB — BASIC METABOLIC PANEL
Anion gap: 8 (ref 5–15)
BUN: 10 mg/dL (ref 6–20)
CO2: 24 mmol/L (ref 22–32)
Calcium: 8.9 mg/dL (ref 8.9–10.3)
Chloride: 102 mmol/L (ref 101–111)
Creatinine, Ser: 0.75 mg/dL (ref 0.61–1.24)
GFR calc Af Amer: >60 mL/min (ref >60)
GFR calc non Af Amer: >60 mL/min (ref >60)
Glucose, Bld: 109 mg/dL — ABNORMAL HIGH (ref 65–99)
Potassium: 3.6 mmol/L (ref 3.5–5.1)
Sodium: 134 mmol/L — ABNORMAL LOW (ref 135–145)

## 2016-07-09 LAB — CBC
HCT: 40.8 % (ref 39.0–52.0)
Hemoglobin: 13.6 g/dL (ref 13.0–17.0)
MCH: 28.9 pg (ref 26.0–34.0)
MCHC: 33.3 g/dL (ref 30.0–36.0)
MCV: 86.6 fL (ref 78.0–100.0)
Platelets: 264 10*3/uL (ref 150–400)
RBC: 4.71 MIL/uL (ref 4.22–5.81)
RDW: 12.6 % (ref 11.5–15.5)
WBC: 5.6 10*3/uL (ref 4.0–10.5)

## 2016-07-09 LAB — I-STAT TROPONIN, ED: Troponin i, poc: 0 ng/mL (ref 0.00–0.08)

## 2016-07-09 MED ORDER — CYCLOBENZAPRINE HCL 10 MG PO TABS: 10.0000 mg | ORAL_TABLET | Freq: Two times a day (BID) | ORAL | 0 refills | Status: DC | PRN

## 2016-07-09 MED ORDER — NAPROXEN 500 MG PO TABS: 500.0000 mg | ORAL_TABLET | Freq: Two times a day (BID) | ORAL | 0 refills | Status: DC

## 2016-07-09 NOTE — Discharge Instructions (Signed)
Naproxen as prescribed. Flexeril as prescribed as needed for pain not relieved with naproxen.  Return to the emergency department if you develop worsening pain, difficulty breathing, high fever, or other new and concerning symptoms.

## 2016-07-09 NOTE — ED Triage Notes (Signed)
Pt seen in ED earlier today for chest pain.  Pt took Naproxen x 1, did not take a Flexeril.  Pt reports left lateral chest pain, burning.  Pt was unable to go to sleep d/t pain.  No shortness of breath or sweating.

## 2016-07-09 NOTE — ED Triage Notes (Signed)
Pt reports snorting small amount of cocaine yesterday.

## 2016-07-09 NOTE — ED Notes (Signed)
Unable to locate in lobby. 

## 2016-07-09 NOTE — ED Triage Notes (Signed)
To room via EMS.  Onset 8:30a sharp pain and shortness of breath left upper abd, radiating to left chest.  Seen 1 week ago for mid chest pain.  EMS gave ASA 324 mg, NTG x 1 with no relief.

## 2016-07-09 NOTE — ED Provider Notes (Signed)
[facility]EMERGENCY DEPT Provider Note   CSN: [id number] Arrival date & time: 07/09/16  1302     History   Chief Complaint Chief Complaint  Patient presents with  . Chest Pain    HPI Wesley [name] is a 50 y.o. male.  Patient is a 50 year old male with no significant past medical history. He presents for evaluation of chest pain. He reports at 8:30 this morning with sudden onset of sharp pain in the left side of his chest. This made him feel short of breath. His pain is worse with movement and change in position. He denies any fevers, chills, or productive cough. He has no prior cardiac history and no cardiac risk factors.      Past Medical History:  Diagnosis Date  . Alcohol abuse   . Cocaine abuse   . Hypertension     There are no active problems to display for this patient.   History reviewed. No pertinent surgical history.     Home Medications    Prior to Admission medications   Medication Sig Start Date End Date Taking? Authorizing Provider  acetaminophen (TYLENOL) 325 MG tablet Take 650 mg by mouth every 6 (six) hours as needed for mild pain.    Historical Provider, MD  ibuprofen (ADVIL,MOTRIN) 200 MG tablet Take 200 mg by mouth every 6 (six) hours as needed for mild pain.    Historical Provider, MD  meloxicam (MOBIC) 7.5 MG tablet Take 1 tablet (7.5 mg total) by mouth daily. Patient not taking: Reported on 07/03/2016 03/11/16   April Palumbo, MD  naproxen (NAPROSYN) 500 MG tablet Take 1 tablet (500 mg total) by mouth 2 (two) times daily. Patient not taking: Reported on 07/03/2016 04/01/16   Jaynie Crumbleatyana Kirichenko, PA-C  permethrin (ELIMITE) 5 % cream Apply to affected area once Leave on for 12-14 hours, then wash off 07/03/16   Eber HongBrian Miller, MD  traMADol (ULTRAM) 50 MG tablet Take 1 tablet (50 mg total) by mouth every 6 (six) hours as needed. Patient not taking: Reported on 07/03/2016 04/01/16   Jaynie Crumbleatyana Kirichenko, PA-C    Family History Family History  Problem  Relation Age of Onset  . Hypertension Mother   . Hypertension Father     Social History Social History  Substance Use Topics  . Smoking status: Never Smoker  . Smokeless tobacco: Never Used  . Alcohol use Yes     Comment: daily- about 6 beers a day     Allergies   Review of patient's allergies indicates no known allergies.   Review of Systems Review of Systems  All other systems reviewed and are negative.    Physical Exam Updated Vital Signs SpO2 97%   Physical Exam  Constitutional: He is oriented to person, place, and time. He appears well-developed and well-nourished. No distress.  HENT:  Head: Normocephalic and atraumatic.  Mouth/Throat: Oropharynx is clear and moist.  Neck: Normal range of motion. Neck supple.  Cardiovascular: Normal rate and regular rhythm.  Exam reveals no friction rub.   No murmur heard. Pulmonary/Chest: Effort normal and breath sounds normal. No respiratory distress. He has no wheezes. He has no rales. He exhibits tenderness.  There is some tenderness to palpation of the left lateral chest wall.  Abdominal: Soft. Bowel sounds are normal. He exhibits no distension. There is no tenderness.  Musculoskeletal: Normal range of motion. He exhibits no edema.  Neurological: He is alert and oriented to person, place, and time. Coordination normal.  Skin: Skin is warm and dry.  He is not diaphoretic.  Nursing note and vitals reviewed.    ED Treatments / Results  Labs (all labs ordered are listed, but only abnormal results are displayed) Labs Reviewed  BASIC METABOLIC PANEL  CBC  I-STAT TROPOININ, ED    EKG  EKG Interpretation  Date/Time:  Sunday July 09 2016 13:04:01 EDT Ventricular Rate:  96 PR Interval:    QRS Duration: 101 QT Interval:  366 QTC Calculation: 463 R Axis:   -48 Text Interpretation:  Sinus rhythm LAD, consider left anterior fascicular block Abnormal R-wave progression, late transition Left ventricular hypertrophy  Confirmed by DELO  MD, DOUGLAS ([id number]) on 07/09/2016 1:08:07 PM       Radiology No results found.  Procedures Procedures (including critical care time)  Medications Ordered in ED Medications - No data to display   Initial Impression / Assessment and Plan / ED Course  I have reviewed the triage vital signs and the nursing notes.  Pertinent labs & imaging results that were available during my care of the patient were reviewed by me and considered in my medical decision making (see chart for details).  Clinical Course    Patient presents with complaints of pain to his left lateral chest wall. There is nothing in the workup to suggest a cardiac etiology. He is tender to palpation and his symptoms are very reproducible. His chest x-ray is clear, troponin is negative, and EKG is unchanged. I see no indication for further workup. I suspect a musculoskeletal etiology and will treat with anti-inflammatories, muscle relaxers, and when necessary follow-up.  Final Clinical Impressions(s) / ED Diagnoses   Final diagnoses:  None    New Prescriptions New Prescriptions   No medications on file     Geoffery Lyonsouglas Delo, MD 07/09/16 1443

## 2016-07-09 NOTE — ED Notes (Signed)
Pt called for room by Manuela Schwartzuth RN, but no answer after repeated attempts.

## 2016-07-09 NOTE — ED Notes (Signed)
Multiple attempts made to call pt without response in waiting room.

## 2016-07-10 ENCOUNTER — Encounter (HOSPITAL_COMMUNITY): Payer: Self-pay

## 2016-07-10 ENCOUNTER — Emergency Department (HOSPITAL_COMMUNITY)
Admission: EM | Admit: 2016-07-10 | Discharge: 2016-07-10 | Disposition: A | Discharge status: Home / Self Care | Payer: Self-pay | Attending: Emergency Medicine | Admitting: Emergency Medicine

## 2016-07-10 DIAGNOSIS — I10 Essential (primary) hypertension: Secondary | ICD-10-CM | POA: Insufficient documentation

## 2016-07-10 DIAGNOSIS — R0789 Other chest pain: Secondary | ICD-10-CM | POA: Insufficient documentation

## 2016-07-10 LAB — BASIC METABOLIC PANEL
Anion gap: 11 (ref 5–15)
BUN: 10 mg/dL (ref 6–20)
CO2: 21 mmol/L — ABNORMAL LOW (ref 22–32)
Calcium: 9.9 mg/dL (ref 8.9–10.3)
Chloride: 103 mmol/L (ref 101–111)
Creatinine, Ser: 0.78 mg/dL (ref 0.61–1.24)
GFR calc Af Amer: >60 mL/min (ref >60)
GFR calc non Af Amer: >60 mL/min (ref >60)
Glucose, Bld: 117 mg/dL — ABNORMAL HIGH (ref 65–99)
Potassium: 3.4 mmol/L — ABNORMAL LOW (ref 3.5–5.1)
Sodium: 135 mmol/L (ref 135–145)

## 2016-07-10 LAB — CBC
HCT: 44.3 % (ref 39.0–52.0)
Hemoglobin: 14.8 g/dL (ref 13.0–17.0)
MCH: 29.2 pg (ref 26.0–34.0)
MCHC: 33.4 g/dL (ref 30.0–36.0)
MCV: 87.5 fL (ref 78.0–100.0)
Platelets: 275 10*3/uL (ref 150–400)
RBC: 5.06 MIL/uL (ref 4.22–5.81)
RDW: 13.1 % (ref 11.5–15.5)
WBC: 7.2 10*3/uL (ref 4.0–10.5)

## 2016-07-10 LAB — I-STAT TROPONIN, ED: Troponin i, poc: 0 ng/mL (ref 0.00–0.08)

## 2016-07-10 MED ORDER — HYDROXYZINE HCL 25 MG PO TABS: 25.0000 mg | ORAL_TABLET | Freq: Three times a day (TID) | ORAL | 0 refills | Status: DC | PRN

## 2016-07-10 NOTE — Discharge Instructions (Signed)
Please follow up with [facility] for further management of your health.  Avoid drinking alcohol and drug use as it can worsen your chest pain.

## 2016-07-10 NOTE — ED Triage Notes (Signed)
Patient complains of ongoing CP and was seen yesterday for same and diagnosed with chest wall pain. Taking flexeril and NSAID but only 1 dose.

## 2016-07-10 NOTE — ED Notes (Signed)
Placed patient on the monitor waiting for provider

## 2016-07-10 NOTE — ED Notes (Signed)
PA at bedside for assessment.   Pt showing medications that were filled from yesterdays visit.

## 2016-07-10 NOTE — ED Provider Notes (Signed)
[facility]PT Provider Note   CSN: [id number] Arrival date & time: 07/10/16  1103     History   Chief Complaint Chief Complaint  Patient presents with  . Chest Pain    HPI Wesley [name] is a 50 y.o. male.  HPI   50 year old male with history of polysubstance abuse including cocaine and alcohol as well as history of hypertension presenting with complaints of chest pain. Patient developed gradual onset of left-sided chest pain that started yesterday. Pain has been persistence or worsening with movement. Pain is sharp, moderate in intensity, nonradiating without any associated lightheadedness dizziness diaphoresis or nausea. He also feels anxious. He was seen in ED yesterday for the same complaint. Workup was unremarkable and patient was discharged with a diagnosis of chest wall pain. He did take a naproxen but states it provided minimal relief. He is concern of high blood pressure and state that he went to a chiropractor doctor this morning and was told that he has high blood pressure. Reported family history of high blood pressure and request for blood pressure medication. Does not have a primary care provider. He admits to drinking alcohol and cocaine use 3 days ago. . Past Medical History:  Diagnosis Date  . Alcohol abuse   . Cocaine abuse   . Hypertension     There are no active problems to display for this patient.   History reviewed. No pertinent surgical history.     Home Medications    Prior to Admission medications   Medication Sig Start Date End Date Taking? Authorizing Provider  acetaminophen (TYLENOL) 325 MG tablet Take 650 mg by mouth every 6 (six) hours as needed for mild pain.    Historical Provider, MD  cyclobenzaprine (FLEXERIL) 10 MG tablet Take 1 tablet (10 mg total) by mouth 2 (two) times daily as needed for muscle spasms. 07/09/16   Geoffery Lyonsouglas Delo, MD  ibuprofen (ADVIL,MOTRIN) 200 MG tablet Take 200 mg by mouth every 6 (six) hours as needed for mild  pain.    Historical Provider, MD  meloxicam (MOBIC) 7.5 MG tablet Take 1 tablet (7.5 mg total) by mouth daily. Patient not taking: Reported on 07/03/2016 03/11/16   April Palumbo, MD  naproxen (NAPROSYN) 500 MG tablet Take 1 tablet (500 mg total) by mouth 2 (two) times daily. 07/09/16   Geoffery Lyonsouglas Delo, MD  permethrin (ELIMITE) 5 % cream Apply to affected area once Leave on for 12-14 hours, then wash off 07/03/16   Eber HongBrian Miller, MD  traMADol (ULTRAM) 50 MG tablet Take 1 tablet (50 mg total) by mouth every 6 (six) hours as needed. Patient not taking: Reported on 07/03/2016 04/01/16   Jaynie Crumbleatyana Kirichenko, PA-C    Family History Family History  Problem Relation Age of Onset  . Hypertension Mother   . Hypertension Father     Social History Social History  Substance Use Topics  . Smoking status: Never Smoker  . Smokeless tobacco: Never Used  . Alcohol use Yes     Comment: daily- about 6 beers a day     Allergies   Review of patient's allergies indicates no known allergies.   Review of Systems Review of Systems  All other systems reviewed and are negative.    Physical Exam Updated Vital Signs BP 150/90 (BP Location: Right Arm)   Pulse 98   Temp 97.1 F (36.2 C) (Oral)   Resp 20   SpO2 100%   Physical Exam  Constitutional: He appears well-developed and well-nourished. No distress.  HENT:  Head: Atraumatic.  Eyes: Conjunctivae are normal.  Neck: Neck supple.  Cardiovascular: Normal rate, regular rhythm and intact distal pulses.   Pulmonary/Chest: Effort normal and breath sounds normal. He exhibits tenderness (mild tenderness to L axillary region and L lateral chest wall without overlying skin changes).  Abdominal: Soft. There is no tenderness.  Neurological: He is alert.  Skin: No rash noted.  Psychiatric: He has a normal mood and affect.  Nursing note and vitals reviewed.    ED Treatments / Results  Labs (all labs ordered are listed, but only abnormal results are  displayed) Labs Reviewed  BASIC METABOLIC PANEL - Abnormal; Notable for the following:       Result Value   Potassium 3.4 (*)    CO2 21 (*)    Glucose, Bld 117 (*)    All other components within normal limits  CBC  I-STAT TROPOININ, ED    EKG  EKG Interpretation None     ED ECG REPORT   Date: 07/10/2016  Rate: 115  Rhythm: sinus tachycardia  QRS Axis: left  Intervals: normal  ST/T Wave abnormalities: nonspecific ST/T changes  Conduction Disutrbances:left anterior fascicular block  Narrative Interpretation: LVH  Old EKG Reviewed: unchanged  I have personally reviewed the EKG tracing and agree with the computerized printout as noted.   Radiology Dg Chest 2 View  Result Date: 07/09/2016 CLINICAL DATA:  Chest pain EXAM: CHEST  2 VIEW COMPARISON:  07/03/2016 chest radiograph. FINDINGS: Stable cardiomediastinal silhouette with normal heart size. No pneumothorax. No pleural effusion. Lungs appear clear, with no acute consolidative airspace disease and no pulmonary edema. IMPRESSION: No active cardiopulmonary disease. Electronically Signed   By: Delbert PhenixJason A Poff M.D.   On: 07/09/2016 13:59    Procedures Procedures (including critical care time)  Medications Ordered in ED Medications - No data to display   Initial Impression / Assessment and Plan / ED Course  I have reviewed the triage vital signs and the nursing notes.  Pertinent labs & imaging results that were available during my care of the patient were reviewed by me and considered in my medical decision making (see chart for details).  Clinical Course    BP 150/90 (BP Location: Right Arm)   Pulse 98   Temp 97.1 F (36.2 C) (Oral)   Resp 20   SpO2 100%    Final Clinical Impressions(s) / ED Diagnoses   Final diagnoses:  Chest wall pain    New Prescriptions New Prescriptions   HYDROXYZINE (ATARAX/VISTARIL) 25 MG TABLET    Take 1 tablet (25 mg total) by mouth every 8 (eight) hours as needed for anxiety.    12:17 PM Patient with history of alcohol and cocaine abuse here with left-sided chest pain. He has been seen in the ED multiple times in the past for similar pain and has had negative workup. Last ER visit was yesterday for same with unremarkable evaluation. Diagnosed with chest wall pain. Today his EKG shows no acute changes, troponin is negative and labs are reassuring. Have low suspicion for ACS. I strongly encouraged patient to quit alcohol and cocaine use. Blood pressure is mildly elevated, doubt hypertensive emergency.   Plan to prescribe Vistaril as needed for his anxiety. Will give resources for outpt PCP f/u.    Fayrene HelperBowie Tran, PA-C 07/10/16 1302    Maia PlanJoshua G Long, MD 07/10/16 2003

## 2016-07-14 ENCOUNTER — Emergency Department (HOSPITAL_COMMUNITY): Payer: Self-pay

## 2016-07-14 ENCOUNTER — Emergency Department (HOSPITAL_COMMUNITY)
Admission: EM | Admit: 2016-07-14 | Discharge: 2016-07-14 | Disposition: A | Discharge status: Home / Self Care | Payer: Self-pay | Attending: Emergency Medicine | Admitting: Emergency Medicine

## 2016-07-14 ENCOUNTER — Encounter (HOSPITAL_COMMUNITY): Payer: Self-pay | Admitting: Emergency Medicine

## 2016-07-14 DIAGNOSIS — I10 Essential (primary) hypertension: Secondary | ICD-10-CM | POA: Insufficient documentation

## 2016-07-14 DIAGNOSIS — R202 Paresthesia of skin: Secondary | ICD-10-CM | POA: Insufficient documentation

## 2016-07-14 DIAGNOSIS — R791 Abnormal coagulation profile: Secondary | ICD-10-CM | POA: Insufficient documentation

## 2016-07-14 DIAGNOSIS — Z7982 Long term (current) use of aspirin: Secondary | ICD-10-CM | POA: Insufficient documentation

## 2016-07-14 LAB — DIFFERENTIAL
Basophils Absolute: 0 10*3/uL (ref 0.0–0.1)
Basophils Relative: 1 %
Eosinophils Absolute: 0.3 10*3/uL (ref 0.0–0.7)
Eosinophils Relative: 5 %
Lymphocytes Relative: 39 %
Lymphs Abs: 2.4 10*3/uL (ref 0.7–4.0)
Monocytes Absolute: 0.5 10*3/uL (ref 0.1–1.0)
Monocytes Relative: 8 %
Neutro Abs: 3 10*3/uL (ref 1.7–7.7)
Neutrophils Relative %: 47 %

## 2016-07-14 LAB — CBC
HCT: 43.3 % (ref 39.0–52.0)
Hemoglobin: 14.5 g/dL (ref 13.0–17.0)
MCH: 28.9 pg (ref 26.0–34.0)
MCHC: 33.5 g/dL (ref 30.0–36.0)
MCV: 86.3 fL (ref 78.0–100.0)
Platelets: 283 10*3/uL (ref 150–400)
RBC: 5.02 MIL/uL (ref 4.22–5.81)
RDW: 12.6 % (ref 11.5–15.5)
WBC: 6.2 10*3/uL (ref 4.0–10.5)

## 2016-07-14 LAB — I-STAT TROPONIN, ED: Troponin i, poc: 0 ng/mL (ref 0.00–0.08)

## 2016-07-14 LAB — COMPREHENSIVE METABOLIC PANEL
ALT: 30 U/L (ref 17–63)
AST: 29 U/L (ref 15–41)
Albumin: 4.4 g/dL (ref 3.5–5.0)
Alkaline Phosphatase: 71 U/L (ref 38–126)
Anion gap: 8 (ref 5–15)
BUN: 8 mg/dL (ref 6–20)
CO2: 25 mmol/L (ref 22–32)
Calcium: 9.8 mg/dL (ref 8.9–10.3)
Chloride: 104 mmol/L (ref 101–111)
Creatinine, Ser: 0.83 mg/dL (ref 0.61–1.24)
GFR calc Af Amer: >60 mL/min (ref >60)
GFR calc non Af Amer: >60 mL/min (ref >60)
Glucose, Bld: 116 mg/dL — ABNORMAL HIGH (ref 65–99)
Potassium: 3.7 mmol/L (ref 3.5–5.1)
Sodium: 137 mmol/L (ref 135–145)
Total Bilirubin: 0.5 mg/dL (ref 0.3–1.2)
Total Protein: 8.2 g/dL — ABNORMAL HIGH (ref 6.5–8.1)

## 2016-07-14 LAB — I-STAT CHEM 8, ED
BUN: 8 mg/dL (ref 6–20)
Calcium, Ion: 1.18 mmol/L (ref 1.15–1.40)
Chloride: 102 mmol/L (ref 101–111)
Creatinine, Ser: 0.9 mg/dL (ref 0.61–1.24)
Glucose, Bld: 111 mg/dL — ABNORMAL HIGH (ref 65–99)
HCT: 45 % (ref 39.0–52.0)
Hemoglobin: 15.3 g/dL (ref 13.0–17.0)
Potassium: 3.8 mmol/L (ref 3.5–5.1)
Sodium: 138 mmol/L (ref 135–145)
TCO2: 24 mmol/L (ref 0–100)

## 2016-07-14 LAB — PROTIME-INR
INR: 1.08
Prothrombin Time: 14 seconds (ref 11.4–15.2)

## 2016-07-14 LAB — APTT: aPTT: 29 seconds (ref 24–36)

## 2016-07-14 MED ORDER — LORAZEPAM 2 MG/ML IJ SOLN
1.0000 mg | Freq: Once | INTRAMUSCULAR | Status: AC
  Administered 2016-07-14: 1 mg via INTRAVENOUS
  Filled 2016-07-14: qty 1

## 2016-07-14 NOTE — ED Notes (Signed)
Patient transported to MRI 

## 2016-07-14 NOTE — ED Notes (Signed)
Pt to CT

## 2016-07-14 NOTE — ED Triage Notes (Signed)
Pt sts numbness in left hand x 3 days with left sided facial twitching and pain in neck

## 2016-07-14 NOTE — ED Provider Notes (Signed)
[facility]-EMERGENCY DEPT Provider Note   CSN: [id number] Arrival date & time: 07/14/16  1119     History   Chief Complaint Chief Complaint  Patient presents with  . Numbness    HPI Wesley [name] is a 50 y.o. male.  The history is provided by the patient.  Patient presents to the emergency room with complaints of numbness in his index and middle finger of his left hand. This started over a week ago. He has had persistent numbness in his fingers associated with some pain radiating down his left arm. Patient does have history of alcohol abuse, cocaine abuse, and recurrent episodes of chest pain. He has been seen in the emergency room several times in the past. This is his sixth visit to the emergency room in the last 30 days.  Patient does not have a primary care doctor and has not been able to see one.  Patient states last time he was seen he was evaluated for chest pain. He was having the arm pain at that time. Currently he is not having any chest pain. He also denies any alcohol or cocaine use since the last time he was seen in the emergency room. Patient denies any trouble with his speech. No trouble with his balance or his gait. He did feel like the left side of his face was tingling this morning as well. Post-to see a chiropractor today at 1 PM but he was not feeling well enough so he came to the emergency room.  Past Medical History:  Diagnosis Date  . Alcohol abuse   . Cocaine abuse   . Hypertension     There are no active problems to display for this patient.   History reviewed. No pertinent surgical history.     Home Medications    Prior to Admission medications   Medication Sig Start Date End Date Taking? Authorizing Provider  aspirin 325 MG EC tablet Take 325 mg by mouth daily.   Yes Historical Provider, MD  cyclobenzaprine (FLEXERIL) 10 MG tablet Take 1 tablet (10 mg total) by mouth 2 (two) times daily as needed for muscle spasms. 07/09/16  Yes Geoffery Lyonsouglas Delo, MD    acetaminophen (TYLENOL) 325 MG tablet Take 650 mg by mouth every 6 (six) hours as needed for mild pain.    Historical Provider, MD  hydrOXYzine (ATARAX/VISTARIL) 25 MG tablet Take 1 tablet (25 mg total) by mouth every 8 (eight) hours as needed for anxiety. Patient not taking: Reported on 07/14/2016 07/10/16   Fayrene HelperBowie Tran, PA-C  ibuprofen (ADVIL,MOTRIN) 200 MG tablet Take 200 mg by mouth every 6 (six) hours as needed for mild pain.    Historical Provider, MD  meloxicam (MOBIC) 7.5 MG tablet Take 1 tablet (7.5 mg total) by mouth daily. Patient not taking: Reported on 07/14/2016 03/11/16   April Palumbo, MD  naproxen (NAPROSYN) 500 MG tablet Take 1 tablet (500 mg total) by mouth 2 (two) times daily. Patient not taking: Reported on 07/14/2016 07/09/16   Geoffery Lyonsouglas Delo, MD  permethrin (ELIMITE) 5 % cream Apply to affected area once Leave on for 12-14 hours, then wash off Patient not taking: Reported on 07/14/2016 07/03/16   Eber HongBrian Miller, MD  traMADol (ULTRAM) 50 MG tablet Take 1 tablet (50 mg total) by mouth every 6 (six) hours as needed. Patient not taking: Reported on 07/14/2016 04/01/16   Jaynie Crumbleatyana Kirichenko, PA-C    Family History Family History  Problem Relation Age of Onset  . Hypertension Mother   . Hypertension  Father     Social History Social History  Substance Use Topics  . Smoking status: Never Smoker  . Smokeless tobacco: Never Used  . Alcohol use Yes     Comment: daily- about 6 beers a day     Allergies   Review of patient's allergies indicates no known allergies.   Review of Systems Review of Systems  All other systems reviewed and are negative.    Physical Exam Updated Vital Signs BP 139/98   Pulse 107   Temp 98.1 F (36.7 C) (Oral)   Resp 16   SpO2 97%   Physical Exam  Constitutional: He appears well-developed and well-nourished. No distress.  HENT:  Head: Normocephalic and atraumatic.  Right Ear: External ear normal.  Left Ear: External ear normal.  Eyes:  Conjunctivae are normal. Right eye exhibits no discharge. Left eye exhibits no discharge. No scleral icterus.  Neck: Neck supple. No tracheal deviation present.  Cardiovascular: Normal rate, regular rhythm and intact distal pulses.   Pulmonary/Chest: Effort normal and breath sounds normal. No stridor. No respiratory distress. He has no wheezes. He has no rales.  Abdominal: Soft. Bowel sounds are normal. He exhibits no distension. There is no tenderness. There is no rebound and no guarding.  Musculoskeletal: He exhibits no edema or tenderness.  Neurological: He is alert. He has normal strength. No cranial nerve deficit (no facial droop, extraocular movements intact, no slurred speech) or sensory deficit. He exhibits normal muscle tone. He displays no seizure activity. Coordination normal.  Complains of decreased sensation of the index finger and middle finger left hand, sensation is intact  Skin: Skin is warm and dry. No rash noted.  Psychiatric: He has a normal mood and affect.  Nursing note and vitals reviewed.    ED Treatments / Results  Labs (all labs ordered are listed, but only abnormal results are displayed) Labs Reviewed  COMPREHENSIVE METABOLIC PANEL - Abnormal; Notable for the following:       Result Value   Glucose, Bld 116 (*)    Total Protein 8.2 (*)    All other components within normal limits  I-STAT CHEM 8, ED - Abnormal; Notable for the following:    Glucose, Bld 111 (*)    All other components within normal limits  PROTIME-INR  APTT  CBC  DIFFERENTIAL  I-STAT TROPOININ, ED  CBG MONITORING, ED     Radiology Ct Head Wo Contrast  Result Date: 07/14/2016 CLINICAL DATA:  Left hand numbness EXAM: CT HEAD WITHOUT CONTRAST CT CERVICAL SPINE WITHOUT CONTRAST TECHNIQUE: Multidetector CT imaging of the head and cervical spine was performed following the standard protocol without intravenous contrast. Multiplanar CT image reconstructions of the cervical spine were also  generated. COMPARISON:  CT head and CT cervical spine March 11, 2016 ; head CT November 09, 2008 FINDINGS: CT HEAD FINDINGS Brain: The ventricles are normal in size and configuration. There is no intracranial mass, hemorrhage, extra-axial fluid collection, or midline shift. 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 Elsewhere gray-white compartments appear normal. There is no evident acute infarct. Vascular: There is no hyperdense vessel. No vascular calcifications are evident. Skull: The bony calvarium appears intact. Sinuses/Orbits: There is opacification in several ethmoid air cells. There is a a retention cyst in the superior left maxillary antrum. There is a small retention cyst in the right sphenoid sinus. Other visualized paranasal sinuses are clear. The patient appears to  have had lens removal on the right. Orbits otherwise appear unremarkable bilaterally. Other: Mastoid air cells are clear. CT CERVICAL SPINE FINDINGS Alignment: There is no demonstrable spondylolisthesis. Skull base and vertebrae: The craniocervical junction region and skull base regions appear unremarkable. There is no fracture. No blastic or lytic bone lesions are evident. Soft tissues and spinal canal: Prevertebral soft tissues and predental space regions are normal. No spinal stenosis is evident. Disc levels: There is slight disc space narrowing anteriorly at C3-4 and at C6-7. There is mild central disc bulging at C3-4, C4-5, C5-6, and C6-7 without nerve root edema or effacement evident at these levels. No nerve root edema or effacement is appreciable on this study. No disc extrusion evident. Upper chest: Visualized lung apices are clear. 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 Elsewhere gray-white compartments appear normal. No acute infarct evident. No mass, hemorrhage, or  extra-axial fluid collection. There are areas of paranasal sinus disease. CT cervical spine: No fracture or spondylolisthesis. Areas of mild osteoarthritic change. No nerve root edema or effacement evident. No disc extrusion or stenosis appreciable. Electronically Signed   By: Bretta Bang III M.D.   On: 07/14/2016 13:23   Ct Cervical Spine Wo Contrast  Result Date: 07/14/2016 CLINICAL DATA:  Left hand numbness EXAM: CT HEAD WITHOUT CONTRAST CT CERVICAL SPINE WITHOUT CONTRAST TECHNIQUE: Multidetector CT imaging of the head and cervical spine was performed following the standard protocol without intravenous contrast. Multiplanar CT image reconstructions of the cervical spine were also generated. COMPARISON:  CT head and CT cervical spine March 11, 2016 ; head CT November 09, 2008 FINDINGS: CT HEAD FINDINGS Brain: The ventricles are normal in size and configuration. There is no intracranial mass, hemorrhage, extra-axial fluid collection, or midline shift. 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 Elsewhere gray-white compartments appear normal. There is no evident acute infarct. Vascular: There is no hyperdense vessel. No vascular calcifications are evident. Skull: The bony calvarium appears intact. Sinuses/Orbits: There is opacification in several ethmoid air cells. There is a a retention cyst in the superior left maxillary antrum. There is a small retention cyst in the right sphenoid sinus. Other visualized paranasal sinuses are clear. The patient appears to have had lens removal on the right. Orbits otherwise appear unremarkable bilaterally. Other: Mastoid air cells are clear. CT CERVICAL SPINE FINDINGS Alignment: There is no demonstrable spondylolisthesis. Skull base and vertebrae: The craniocervical junction region and skull base regions appear unremarkable. There is no fracture. No blastic or lytic bone lesions are evident. Soft  tissues and spinal canal: Prevertebral soft tissues and predental space regions are normal. No spinal stenosis is evident. Disc levels: There is slight disc space narrowing anteriorly at C3-4 and at C6-7. There is mild central disc bulging at C3-4, C4-5, C5-6, and C6-7 without nerve root edema or effacement evident at these levels. No nerve root edema or effacement is appreciable on this study. No disc extrusion evident. Upper chest: Visualized lung apices are clear. 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 Elsewhere gray-white compartments appear normal. No acute infarct evident. No mass, hemorrhage, or extra-axial fluid collection. There are areas of paranasal sinus disease. CT cervical spine: No fracture or spondylolisthesis. Areas of mild osteoarthritic change. No nerve root edema or effacement evident. No disc extrusion or stenosis  appreciable. Electronically Signed   By: Bretta Bang III M.D.   On: 07/14/2016 13:23   Mr Brain Wo Contrast  Result Date: 07/14/2016 CLINICAL DATA:  50 year old male with left hand numbness, left facial twitching and left neck pain for 3 days. Abnormal head CT. Initial encounter. EXAM: MRI HEAD WITHOUT CONTRAST TECHNIQUE: Multiplanar, multiecho pulse sequences of the brain and surrounding structures were obtained without intravenous contrast. COMPARISON:  Head CT without contrast 07/14/2016 and earlier. FINDINGS: Brain: No restricted diffusion to suggest acute infarction. No midline shift, mass effect, evidence of mass lesion, ventriculomegaly, extra-axial collection or acute intracranial hemorrhage. Cervicomedullary junction and pituitary are within normal limits. Small areas of bilateral inferior frontal gyrus encephalomalacia (series 7, image 19 on the left and image 21 on the right). This pattern is typical of posttraumatic encephalomalacia related to cerebral contusions. No other  encephalomalacia identified. No chronic cerebral blood products. Elsewhere gray and white matter signal is within normal limits for age throughout the brain. Vascular: Major intracranial vascular flow voids are normal. Skull and upper cervical spine: Negative. Normal bone marrow signal. Sinuses/Orbits: Postoperative changes to the right globe. Otherwise normal orbits soft tissues. Mild paranasal sinus mucosal thickening. Negative scalp soft tissues. Other: Visible internal auditory structures appear normal. Mastoids are clear. IMPRESSION: 1.  No acute intracranial abnormality. 2. 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 This is typical of posttraumatic injury (e.g. prior closed head injury). 3. Otherwise normal noncontrast MRI appearance of the brain. Electronically Signed   By: Odessa Fleming M.D.   On: 07/14/2016 16:43    Procedures Procedures (including critical care time)  Medications Ordered in ED Medications  LORazepam (ATIVAN) injection 1 mg (1 mg Intravenous Given 07/14/16 1602)     Initial Impression / Assessment and Plan / ED Course  I have reviewed the triage vital signs and the nursing notes.  Pertinent labs & imaging results that were available during my care of the patient were reviewed by me and considered in my medical decision making (see chart for details).  Clinical Course   The patient's CT scan showed an abnormality suggestive of a possible old stroke. MRI was done to exclude the possibility of an acute stroke considering his cocaine use. The MRI did not show any acute abnormality. The patient does report a history of prior head injury when he was younger.  I suspect his paresthesia and his hand is related to nerve impingement. At this point I do not feel there is any acute emergency medical condition. I think the patient can safely follow-up as an outpatient. I recommend [facility].  Final Clinical Impressions(s) / ED  Diagnoses   Final diagnoses:  Paresthesia    New Prescriptions New Prescriptions   No medications on file     Linwood Dibbles, MD 07/14/16 1715

## 2016-07-14 NOTE — ED Notes (Signed)
Patient requesting medication for anxiety from claustrophobia with MRI.

## 2016-07-15 ENCOUNTER — Emergency Department (HOSPITAL_COMMUNITY): Payer: Self-pay

## 2016-07-15 ENCOUNTER — Emergency Department (HOSPITAL_COMMUNITY)
Admission: EM | Admit: 2016-07-15 | Discharge: 2016-07-15 | Disposition: A | Discharge status: Home / Self Care | Payer: Self-pay | Attending: Emergency Medicine | Admitting: Emergency Medicine

## 2016-07-15 ENCOUNTER — Encounter (HOSPITAL_COMMUNITY): Payer: Self-pay

## 2016-07-15 DIAGNOSIS — Z79899 Other long term (current) drug therapy: Secondary | ICD-10-CM | POA: Insufficient documentation

## 2016-07-15 DIAGNOSIS — Z7982 Long term (current) use of aspirin: Secondary | ICD-10-CM | POA: Insufficient documentation

## 2016-07-15 DIAGNOSIS — I10 Essential (primary) hypertension: Secondary | ICD-10-CM | POA: Insufficient documentation

## 2016-07-15 DIAGNOSIS — R0789 Other chest pain: Secondary | ICD-10-CM | POA: Insufficient documentation

## 2016-07-15 LAB — BASIC METABOLIC PANEL
Anion gap: 10 (ref 5–15)
BUN: 8 mg/dL (ref 6–20)
CO2: 26 mmol/L (ref 22–32)
Calcium: 9.4 mg/dL (ref 8.9–10.3)
Chloride: 102 mmol/L (ref 101–111)
Creatinine, Ser: 0.75 mg/dL (ref 0.61–1.24)
GFR calc Af Amer: >60 mL/min (ref >60)
GFR calc non Af Amer: >60 mL/min (ref >60)
Glucose, Bld: 111 mg/dL — ABNORMAL HIGH (ref 65–99)
Potassium: 4.1 mmol/L (ref 3.5–5.1)
Sodium: 138 mmol/L (ref 135–145)

## 2016-07-15 LAB — I-STAT TROPONIN, ED: Troponin i, poc: 0 ng/mL (ref 0.00–0.08)

## 2016-07-15 LAB — CBC
HCT: 42.1 % (ref 39.0–52.0)
Hemoglobin: 13.9 g/dL (ref 13.0–17.0)
MCH: 28.8 pg (ref 26.0–34.0)
MCHC: 33 g/dL (ref 30.0–36.0)
MCV: 87.2 fL (ref 78.0–100.0)
Platelets: 290 10*3/uL (ref 150–400)
RBC: 4.83 MIL/uL (ref 4.22–5.81)
RDW: 12.6 % (ref 11.5–15.5)
WBC: 5.8 10*3/uL (ref 4.0–10.5)

## 2016-07-15 MED ORDER — HYDROXYZINE HCL 25 MG PO TABS
50.0000 mg | ORAL_TABLET | Freq: Once | ORAL | Status: AC
  Administered 2016-07-15: 50 mg via ORAL
  Filled 2016-07-15: qty 2

## 2016-07-15 NOTE — ED Provider Notes (Signed)
[facility]-EMERGENCY DEPT Provider Note   CSN: [id number] Arrival date & time: 07/15/16  1359     History   Chief Complaint Chief Complaint  Patient presents with  . Chest Pain    HPI  Blood pressure 133/96, pulse 76, temperature 98.2 F (36.8 C), temperature source Oral, resp. rate 25, SpO2 100 %.  Wesley [name] is a 50 y.o. male acute onset of left-sided nonradiating sharp chest pain rated at 5 out of 10 one hour ago while patient was sitting in a car is associated with nausea and no shortness of breath, diaphoresis, cough, fever, chills, syncope. He states this feels different than his prior chest pain because on the left side. He took Flexeril with no relief. He took a full dose 325 mg aspirin this morning, he denies any cocaine/methamphetamine use, states he hasn't drank alcohol in a week, no family history of ACS. He has a primary care appointment set up next week. He denies history of DVT/PE, recent immobilizations, calf pain, leg swelling  Past Medical History:  Diagnosis Date  . Alcohol abuse   . Cocaine abuse   . Hypertension     There are no active problems to display for this patient.   History reviewed. No pertinent surgical history.     Home Medications    Prior to Admission medications   Medication Sig Start Date End Date Taking? Authorizing Provider  aspirin 325 MG EC tablet Take 325 mg by mouth daily.   Yes Historical Provider, MD  cyclobenzaprine (FLEXERIL) 10 MG tablet Take 1 tablet (10 mg total) by mouth 2 (two) times daily as needed for muscle spasms. 07/09/16  Yes Geoffery Lyons, MD  acetaminophen (TYLENOL) 325 MG tablet Take 650 mg by mouth every 6 (six) hours as needed for mild pain.    Historical Provider, MD  hydrOXYzine (ATARAX/VISTARIL) 25 MG tablet Take 1 tablet (25 mg total) by mouth every 8 (eight) hours as needed for anxiety. Patient not taking: Reported on 07/15/2016 07/10/16   Fayrene Helper, PA-C  ibuprofen (ADVIL,MOTRIN) 200 MG tablet Take 200 mg by  mouth every 6 (six) hours as needed for mild pain.    Historical Provider, MD  meloxicam (MOBIC) 7.5 MG tablet Take 1 tablet (7.5 mg total) by mouth daily. Patient not taking: Reported on 07/15/2016 03/11/16   April Palumbo, MD  naproxen (NAPROSYN) 500 MG tablet Take 1 tablet (500 mg total) by mouth 2 (two) times daily. Patient not taking: Reported on 07/15/2016 07/09/16   Geoffery Lyons, MD  permethrin (ELIMITE) 5 % cream Apply to affected area once Leave on for 12-14 hours, then wash off Patient not taking: Reported on 07/15/2016 07/03/16   Eber Hong, MD  traMADol (ULTRAM) 50 MG tablet Take 1 tablet (50 mg total) by mouth every 6 (six) hours as needed. Patient not taking: Reported on 07/15/2016 04/01/16   Jaynie Crumble, PA-C    Family History Family History  Problem Relation Age of Onset  . Hypertension Mother   . Hypertension Father     Social History Social History  Substance Use Topics  . Smoking status: Never Smoker  . Smokeless tobacco: Never Used  . Alcohol use Yes     Comment: daily- about 6 beers a day     Allergies   Review of patient's allergies indicates no known allergies.   Review of Systems Review of Systems  10 systems reviewed and found to be negative, except as noted in the HPI.   Physical Exam Updated Vital  Signs BP 133/96   Pulse 76   Temp 98.2 F (36.8 C) (Oral)   Resp 25   SpO2 100%   Physical Exam  Constitutional: He is oriented to person, place, and time. He appears well-developed and well-nourished. No distress.  HENT:  Head: Normocephalic and atraumatic.  Mouth/Throat: Oropharynx is clear and moist.  Eyes: Conjunctivae and EOM are normal. Pupils are equal, round, and reactive to light.  Neck: Normal range of motion. No JVD present. No tracheal deviation present.  Cardiovascular: Normal rate, regular rhythm and intact distal pulses.   Radial pulse equal bilaterally  Pulmonary/Chest: Effort normal and breath sounds normal. No stridor.  No respiratory distress. He has no wheezes. He has no rales. He exhibits no tenderness.  Abdominal: Soft. He exhibits no distension and no mass. There is no tenderness. There is no rebound and no guarding.  Musculoskeletal: Normal range of motion. He exhibits no edema or tenderness.  No calf asymmetry, superficial collaterals, palpable cords, edema, Homans sign negative bilaterally.    Neurological: He is alert and oriented to person, place, and time.  Skin: Skin is warm. He is not diaphoretic.  Psychiatric: He has a normal mood and affect.  Nursing note and vitals reviewed.    ED Treatments / Results  Labs (all labs ordered are listed, but only abnormal results are displayed) Labs Reviewed  BASIC METABOLIC PANEL - Abnormal; Notable for the following:       Result Value   Glucose, Bld 111 (*)    All other components within normal limits  CBC  I-STAT TROPOININ, ED    EKG  EKG Interpretation  Date/Time:  Saturday July 15 2016 14:03:20 EDT Ventricular Rate:  79 PR Interval:  126 QRS Duration: 108 QT Interval:  386 QTC Calculation: 442 R Axis:   -40 Text Interpretation:  Normal sinus rhythm Left axis deviation Minimal voltage criteria for LVH, may be normal variant Abnormal ECG agree. no sig chnage fromold Confirmed by Donnald Garre, MD, Lebron Conners [phone]) on 07/15/2016 2:52:26 PM       Radiology Dg Chest 2 View  Result Date: 07/15/2016 CLINICAL DATA:  Chest pain EXAM: CHEST  2 VIEW COMPARISON:  07/09/2016 chest radiograph. FINDINGS: Stable cardiomediastinal silhouette with top-normal heart size. No pneumothorax. No pleural effusion. Lungs appear clear, with no acute consolidative airspace disease and no pulmonary edema. IMPRESSION: No active cardiopulmonary disease. Electronically Signed   By: Delbert Phenix M.D.   On: 07/15/2016 14:33   Ct Head Wo Contrast  Result Date: 07/14/2016 CLINICAL DATA:  Left hand numbness EXAM: CT HEAD WITHOUT CONTRAST CT CERVICAL SPINE WITHOUT CONTRAST  TECHNIQUE: Multidetector CT imaging of the head and cervical spine was performed following the standard protocol without intravenous contrast. Multiplanar CT image reconstructions of the cervical spine were also generated. COMPARISON:  CT head and CT cervical spine March 11, 2016 ; head CT November 09, 2008 FINDINGS: CT HEAD FINDINGS Brain: The ventricles are normal in size and configuration. There is no intracranial mass, hemorrhage, extra-axial fluid collection, or midline shift. 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 Elsewhere gray-white compartments appear normal. There is no evident acute infarct. Vascular: There is no hyperdense vessel. No vascular calcifications are evident. Skull: The bony calvarium appears intact. Sinuses/Orbits: There is opacification in several ethmoid air cells. There is a a retention cyst in the superior left maxillary antrum. There is a small retention cyst in the right  sphenoid sinus. Other visualized paranasal sinuses are clear. The patient appears to have had lens removal on the right. Orbits otherwise appear unremarkable bilaterally. Other: Mastoid air cells are clear. CT CERVICAL SPINE FINDINGS Alignment: There is no demonstrable spondylolisthesis. Skull base and vertebrae: The craniocervical junction region and skull base regions appear unremarkable. There is no fracture. No blastic or lytic bone lesions are evident. Soft tissues and spinal canal: Prevertebral soft tissues and predental space regions are normal. No spinal stenosis is evident. Disc levels: There is slight disc space narrowing anteriorly at C3-4 and at C6-7. There is mild central disc bulging at C3-4, C4-5, C5-6, and C6-7 without nerve root edema or effacement evident at these levels. No nerve root edema or effacement is appreciable on this study. No disc extrusion evident. Upper chest: Visualized lung apices are clear. 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 Elsewhere gray-white compartments appear normal. No acute infarct evident. No mass, hemorrhage, or extra-axial fluid collection. There are areas of paranasal sinus disease. CT cervical spine: No fracture or spondylolisthesis. Areas of mild osteoarthritic change. No nerve root edema or effacement evident. No disc extrusion or stenosis appreciable. Electronically Signed   By: Bretta BangWilliam  Woodruff III M.D.   On: 07/14/2016 13:23   Ct Cervical Spine Wo Contrast  Result Date: 07/14/2016 CLINICAL DATA:  Left hand numbness EXAM: CT HEAD WITHOUT CONTRAST CT CERVICAL SPINE WITHOUT CONTRAST TECHNIQUE: Multidetector CT imaging of the head and cervical spine was performed following the standard protocol without intravenous contrast. Multiplanar CT image reconstructions of the cervical spine were also generated. COMPARISON:  CT head and CT cervical spine March 11, 2016 ; head CT November 09, 2008 FINDINGS: CT HEAD FINDINGS Brain: The ventricles are normal in size and configuration. There is no intracranial mass, hemorrhage, extra-axial fluid collection, or midline shift. 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 Elsewhere gray-white compartments appear normal. There is no evident acute infarct. Vascular: There is no hyperdense vessel. No vascular calcifications are evident. Skull: The bony calvarium appears intact. Sinuses/Orbits: There is opacification in several ethmoid air cells. There is a a retention cyst in the superior left maxillary antrum. There is a small retention cyst in the right sphenoid sinus. Other visualized paranasal sinuses are clear. The patient appears to have had lens removal on the right. Orbits otherwise appear unremarkable bilaterally. Other: Mastoid air cells are clear. CT CERVICAL SPINE FINDINGS Alignment: There is no  demonstrable spondylolisthesis. Skull base and vertebrae: The craniocervical junction region and skull base regions appear unremarkable. There is no fracture. No blastic or lytic bone lesions are evident. Soft tissues and spinal canal: Prevertebral soft tissues and predental space regions are normal. No spinal stenosis is evident. Disc levels: There is slight disc space narrowing anteriorly at C3-4 and at C6-7. There is mild central disc bulging at C3-4, C4-5, C5-6, and C6-7 without nerve root edema or effacement evident at these levels. No nerve root edema or effacement is appreciable on this study. No disc extrusion evident. Upper chest: Visualized lung apices are clear. 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 Elsewhere gray-white compartments appear normal. No acute infarct evident. No mass, hemorrhage, or extra-axial fluid collection. There are areas of paranasal sinus disease. CT cervical spine: No fracture or spondylolisthesis. Areas of mild osteoarthritic change.  No nerve root edema or effacement evident. No disc extrusion or stenosis appreciable. Electronically Signed   By: Bretta BangWilliam  Woodruff III M.D.   On: 07/14/2016 13:23   Mr Brain Wo Contrast  Result Date: 07/14/2016 CLINICAL DATA:  50 year old male with left hand numbness, left facial twitching and left neck pain for 3 days. Abnormal head CT. Initial encounter. EXAM: MRI HEAD WITHOUT CONTRAST TECHNIQUE: Multiplanar, multiecho pulse sequences of the brain and surrounding structures were obtained without intravenous contrast. COMPARISON:  Head CT without contrast 07/14/2016 and earlier. FINDINGS: Brain: No restricted diffusion to suggest acute infarction. No midline shift, mass effect, evidence of mass lesion, ventriculomegaly, extra-axial collection or acute intracranial hemorrhage. Cervicomedullary junction and pituitary are within normal limits. Small areas of  bilateral inferior frontal gyrus encephalomalacia (series 7, image 19 on the left and image 21 on the right). This pattern is typical of posttraumatic encephalomalacia related to cerebral contusions. No other encephalomalacia identified. No chronic cerebral blood products. Elsewhere gray and white matter signal is within normal limits for age throughout the brain. Vascular: Major intracranial vascular flow voids are normal. Skull and upper cervical spine: Negative. Normal bone marrow signal. Sinuses/Orbits: Postoperative changes to the right globe. Otherwise normal orbits soft tissues. Mild paranasal sinus mucosal thickening. Negative scalp soft tissues. Other: Visible internal auditory structures appear normal. Mastoids are clear. IMPRESSION: 1.  No acute intracranial abnormality. 2. 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 This is typical of posttraumatic injury (e.g. prior closed head injury). 3. Otherwise normal noncontrast MRI appearance of the brain. Electronically Signed   By: Odessa FlemingH  Hall M.D.   On: 07/14/2016 16:43    Procedures Procedures (including critical care time)  Medications Ordered in ED Medications  hydrOXYzine (ATARAX/VISTARIL) tablet 50 mg (50 mg Oral Given 07/15/16 1516)     Initial Impression / Assessment and Plan / ED Course  I have reviewed the triage vital signs and the nursing notes.  Pertinent labs & imaging results that were available during my care of the patient were reviewed by me and considered in my medical decision making (see chart for details).  Clinical Course     Vitals:   07/15/16 1404 07/15/16 1415  BP: 144/94 133/96  Pulse: 79 76  Resp: 18 25  Temp: 98.2 F (36.8 C)   TempSrc: Oral   SpO2: 100% 100%    Medications  hydrOXYzine (ATARAX/VISTARIL) tablet 50 mg (50 mg Oral Given 07/15/16 1516)    Wesley [name] is 50 y.o. male presenting with Acute onset of left-sided sharp chest pain 1 hour prior to  arrival not alleviated with Flexeril. EKG with no acute changes, troponin negative, chest x-ray without infiltrate. Patient is low risk by heart score, given the onset of his symptoms recently will need a delta troponin. He is low risk by well's score and PERC negative. Had full dose aspirin this morning, he has a history of multiple visits for chest pain/anxiety.  Case signed out to Dr. Denton LankSteinl at shift change     Final Clinical Impressions(s) / ED Diagnoses   Final diagnoses:  None    New Prescriptions New Prescriptions   No medications on file     Wynetta Emeryicole Pisciotta, PA-C 07/15/16 1558    Cathren LaineKevin Steinl, MD 07/15/16 2102

## 2016-07-15 NOTE — Discharge Instructions (Signed)
It was our pleasure to provide your ER care today - we hope that you feel better.  Follow up with primary care doctor/cardiologist in the coming week - call office Monday to arrange appointment.  Return to ER if worse, recurrent or persistent chest pain, trouble breathing, other concern.   You were given medication in the ER that causes drowsiness - no driving for the next 4 hours.

## 2016-07-15 NOTE — ED Notes (Signed)
Pt transported to xray 

## 2016-07-15 NOTE — ED Triage Notes (Signed)
Patient complains of central chest pain x 1 day. States just recently seen for same and started on flexeril. Pain with inspiration, NAD

## 2016-07-15 NOTE — ED Provider Notes (Signed)
After 1 day of symptoms/continual, trop 0.    Patient is asymptomatic. No cp or sob.  Patient currently appears stable for d/c.      Cathren LaineKevin Steinl, MD 07/15/16 1712

## 2016-07-15 NOTE — ED Notes (Signed)
Pt eating a sandwich at this time. States pt is much better at this time.

## 2016-07-15 NOTE — ED Notes (Signed)
ED Provider at bedside. 

## 2016-07-17 ENCOUNTER — Encounter (HOSPITAL_COMMUNITY): Payer: Self-pay | Admitting: Emergency Medicine

## 2016-07-17 DIAGNOSIS — T481X5A Adverse effect of skeletal muscle relaxants [neuromuscular blocking agents], initial encounter: Secondary | ICD-10-CM | POA: Insufficient documentation

## 2016-07-17 DIAGNOSIS — R079 Chest pain, unspecified: Secondary | ICD-10-CM | POA: Insufficient documentation

## 2016-07-17 DIAGNOSIS — M5412 Radiculopathy, cervical region: Secondary | ICD-10-CM | POA: Insufficient documentation

## 2016-07-17 DIAGNOSIS — Y829 Unspecified medical devices associated with adverse incidents: Secondary | ICD-10-CM | POA: Insufficient documentation

## 2016-07-17 DIAGNOSIS — I10 Essential (primary) hypertension: Secondary | ICD-10-CM | POA: Insufficient documentation

## 2016-07-17 DIAGNOSIS — T887XXA Unspecified adverse effect of drug or medicament, initial encounter: Secondary | ICD-10-CM | POA: Insufficient documentation

## 2016-07-17 DIAGNOSIS — Z79899 Other long term (current) drug therapy: Secondary | ICD-10-CM | POA: Insufficient documentation

## 2016-07-17 DIAGNOSIS — Z7982 Long term (current) use of aspirin: Secondary | ICD-10-CM | POA: Insufficient documentation

## 2016-07-17 LAB — CBC WITH DIFFERENTIAL/PLATELET
Basophils Absolute: 0 10*3/uL (ref 0.0–0.1)
Basophils Relative: 0 %
Eosinophils Absolute: 0.3 10*3/uL (ref 0.0–0.7)
Eosinophils Relative: 5 %
HCT: 41.5 % (ref 39.0–52.0)
Hemoglobin: 14.2 g/dL (ref 13.0–17.0)
Lymphocytes Relative: 48 %
Lymphs Abs: 3.7 10*3/uL (ref 0.7–4.0)
MCH: 29.5 pg (ref 26.0–34.0)
MCHC: 34.2 g/dL (ref 30.0–36.0)
MCV: 86.3 fL (ref 78.0–100.0)
Monocytes Absolute: 0.5 10*3/uL (ref 0.1–1.0)
Monocytes Relative: 7 %
Neutro Abs: 3 10*3/uL (ref 1.7–7.7)
Neutrophils Relative %: 40 %
Platelets: 281 10*3/uL (ref 150–400)
RBC: 4.81 MIL/uL (ref 4.22–5.81)
RDW: 12.9 % (ref 11.5–15.5)
WBC: 7.6 10*3/uL (ref 4.0–10.5)

## 2016-07-17 LAB — URINALYSIS, ROUTINE W REFLEX MICROSCOPIC
Bilirubin Urine: NEGATIVE
Glucose, UA: NEGATIVE mg/dL
Hgb urine dipstick: NEGATIVE
Ketones, ur: NEGATIVE mg/dL
Leukocytes, UA: NEGATIVE
Nitrite: NEGATIVE
Protein, ur: NEGATIVE mg/dL
Specific Gravity, Urine: 1.012 (ref 1.005–1.030)
pH: 6.5 (ref 5.0–8.0)

## 2016-07-17 LAB — I-STAT TROPONIN, ED: Troponin i, poc: 0 ng/mL (ref 0.00–0.08)

## 2016-07-17 NOTE — ED Notes (Signed)
Family at bedside. 

## 2016-07-17 NOTE — ED Triage Notes (Signed)
Pt st's he took a Flexeril earlier today and now feels like his head is hot and his mouth feels dry.  Pt st's he has taken this before but did not feel this way afterwards.

## 2016-07-17 NOTE — ED Notes (Signed)
Called patient 3x, no response. 

## 2016-07-17 NOTE — ED Triage Notes (Signed)
Pt now stating his hair is moving and all his body parts are moving on the inside.  Pt also st's his heart hurts.

## 2016-07-18 ENCOUNTER — Emergency Department (HOSPITAL_COMMUNITY)
Admission: EM | Admit: 2016-07-18 | Discharge: 2016-07-18 | Disposition: A | Discharge status: Home / Self Care | Payer: Self-pay | Attending: Emergency Medicine | Admitting: Emergency Medicine

## 2016-07-18 ENCOUNTER — Emergency Department (HOSPITAL_COMMUNITY): Payer: Self-pay

## 2016-07-18 DIAGNOSIS — R079 Chest pain, unspecified: Secondary | ICD-10-CM

## 2016-07-18 DIAGNOSIS — M542 Cervicalgia: Secondary | ICD-10-CM

## 2016-07-18 DIAGNOSIS — T50905A Adverse effect of unspecified drugs, medicaments and biological substances, initial encounter: Secondary | ICD-10-CM

## 2016-07-18 DIAGNOSIS — M5412 Radiculopathy, cervical region: Secondary | ICD-10-CM

## 2016-07-18 LAB — COMPREHENSIVE METABOLIC PANEL
ALT: 27 U/L (ref 17–63)
AST: 28 U/L (ref 15–41)
Albumin: 4.4 g/dL (ref 3.5–5.0)
Alkaline Phosphatase: 76 U/L (ref 38–126)
Anion gap: 8 (ref 5–15)
BUN: 9 mg/dL (ref 6–20)
CO2: 28 mmol/L (ref 22–32)
Calcium: 10 mg/dL (ref 8.9–10.3)
Chloride: 103 mmol/L (ref 101–111)
Creatinine, Ser: 0.88 mg/dL (ref 0.61–1.24)
GFR calc Af Amer: >60 mL/min (ref >60)
GFR calc non Af Amer: >60 mL/min (ref >60)
Glucose, Bld: 117 mg/dL — ABNORMAL HIGH (ref 65–99)
Potassium: 3.5 mmol/L (ref 3.5–5.1)
Sodium: 139 mmol/L (ref 135–145)
Total Bilirubin: 0.7 mg/dL (ref 0.3–1.2)
Total Protein: 8.5 g/dL — ABNORMAL HIGH (ref 6.5–8.1)

## 2016-07-18 MED ORDER — KETOROLAC TROMETHAMINE 15 MG/ML IJ SOLN
15.0000 mg | Freq: Once | INTRAMUSCULAR | Status: AC
  Administered 2016-07-18: 15 mg via INTRAMUSCULAR
  Filled 2016-07-18: qty 1

## 2016-07-18 MED ORDER — METHYLPREDNISOLONE 4 MG PO TBPK: ORAL_TABLET | ORAL | 0 refills | Status: DC

## 2016-07-18 MED ORDER — DIAZEPAM 5 MG PO TABS
5.0000 mg | ORAL_TABLET | Freq: Once | ORAL | Status: AC
  Administered 2016-07-18: 5 mg via ORAL
  Filled 2016-07-18: qty 1

## 2016-07-18 NOTE — Discharge Instructions (Signed)
You were seen today for worsening neck spasms after Flexeril. Ear exam is reassuring. Your neurologically intact. Her workup is reassuring. You need to follow-up with your primary physician for recheck. He will be started on a Medrol dose pack for anti-inflammatory effects.

## 2016-07-18 NOTE — ED Notes (Signed)
Patient states he has been dizzy for 30 minutes and afraid he will fall in the bathroom. States he did not feel like this when he took a Flexaril yesterday

## 2016-07-18 NOTE — ED Provider Notes (Signed)
[facility]EMER[facility]ENCY DEPT Provider Note   CSN: [id number] Arrival date & time: 07/17/16  1828  By signing my name below, I, Clovis Pu, attest that this documentation has been prepared under the direction and in the presence of Shon Baton, MD  Electronically Signed: Clovis Pu, ED Scribe. 07/18/16. 1:51 AM.  History   Chief Complaint Chief Complaint  Patient presents with  . Medication Reaction   The history is provided by the patient. No language interpreter was used.   HPI Comments:  Wesley [name] is a 50 y.o. male who presents to the Emergency Department complaining of back spasms, back pain and neck spasms after taking Flexiril at 11 AM today. Pt notes That the pain radiated into his chest and head. That has since resolved. He notes he is currently experiencing back pain/spasms. Pt denies cough and fevers. He states he has taken flexeril before but did not experience any of these symptoms at that time. Pt is currently seeing a chiropractor for an accident he was in a couple of months ago and has a follow up appointment in 1 day. Denies any recent chiropractic adjustments He denies any other symptoms or complaints at this time. Denies weakness, numbness, speech difficulty.  Past Medical History:  Diagnosis Date  . Alcohol abuse   . Cocaine abuse   . Hypertension     There are no active problems to display for this patient.   History reviewed. No pertinent surgical history.   Home Medications    Prior to Admission medications   Medication Sig Start Date End Date Taking? Authorizing Provider  acetaminophen (TYLENOL) 325 MG tablet Take 650 mg by mouth every 6 (six) hours as needed for mild pain.   Yes Historical Provider, MD  aspirin 325 MG EC tablet Take 325 mg by mouth daily.   Yes Historical Provider, MD  cyclobenzaprine (FLEXERIL) 10 MG tablet Take 1 tablet (10 mg total) by mouth 2 (two) times daily as needed for muscle spasms. 07/09/16  Yes Geoffery Lyons, MD  ibuprofen  (ADVIL,MOTRIN) 200 MG tablet Take 200 mg by mouth every 6 (six) hours as needed for mild pain.   Yes Historical Provider, MD  hydrOXYzine (ATARAX/VISTARIL) 25 MG tablet Take 1 tablet (25 mg total) by mouth every 8 (eight) hours as needed for anxiety. Patient not taking: Reported on 07/18/2016 07/10/16   Fayrene Helper, PA-C  meloxicam (MOBIC) 7.5 MG tablet Take 1 tablet (7.5 mg total) by mouth daily. Patient not taking: Reported on 07/18/2016 03/11/16   April Palumbo, MD  methylPREDNISolone (MEDROL DOSEPAK) 4 MG TBPK tablet Take as directed on packet 07/18/16   Shon Baton, MD  naproxen (NAPROSYN) 500 MG tablet Take 1 tablet (500 mg total) by mouth 2 (two) times daily. Patient not taking: Reported on 07/18/2016 07/09/16   Geoffery Lyons, MD  permethrin (ELIMITE) 5 % cream Apply to affected area once Leave on for 12-14 hours, then wash off Patient not taking: Reported on 07/18/2016 07/03/16   Eber Hong, MD  traMADol (ULTRAM) 50 MG tablet Take 1 tablet (50 mg total) by mouth every 6 (six) hours as needed. Patient not taking: Reported on 07/18/2016 04/01/16   Jaynie Crumble, PA-C    Family History Family History  Problem Relation Age of Onset  . Hypertension Mother   . Hypertension Father     Social History Social History  Substance Use Topics  . Smoking status: Never Smoker  . Smokeless tobacco: Never Used  . Alcohol use Yes  Comment: daily- about 6 beers a day     Allergies   Patient has no known allergies.   Review of Systems Review of Systems  Constitutional: Negative for fever.  Respiratory: Positive for shortness of breath. Negative for cough.   Cardiovascular: Positive for chest pain.  Musculoskeletal: Positive for back pain and myalgias.  Neurological: Positive for dizziness and headaches.  All other systems reviewed and are negative.  Physical Exam Updated Vital Signs BP 122/86   Pulse 81   Temp 97.5 F (36.4 C) (Oral)   Resp 14   Ht 5\' 5"  (1.651 m)   Wt  180 lb (81.6 kg)   SpO2 99%   BMI 29.95 kg/m   Physical Exam  Constitutional: He is oriented to person, place, and time. He appears well-developed and well-nourished. No distress.  HENT:  Head: Normocephalic and atraumatic.  Neck: Normal range of motion. Neck supple.  Tenderness palpation bilateral paraspinous muscle region and rhomboids, no midline C-spine tenderness, step-off, or deformity  Cardiovascular: Normal rate, regular rhythm and normal heart sounds.   No murmur heard. Pulmonary/Chest: Effort normal and breath sounds normal. No respiratory distress. He has no wheezes.  Abdominal: Soft. Bowel sounds are normal. There is no tenderness. There is no rebound.  Musculoskeletal: He exhibits no edema.  Lymphadenopathy:    He has no cervical adenopathy.  Neurological: He is alert and oriented to person, place, and time.  Cranial nerves II through XII intact, 5 out of 5 strength in all 4 extremities, sensation grossly intact, normal gait  Skin: Skin is warm and dry.  Psychiatric: He has a normal mood and affect.  Nursing note and vitals reviewed.    ED Treatments / Results  DIAGNOSTIC STUDIES:  Oxygen Saturation is 99% on RA, normal by my interpretation.    COORDINATION OF CARE:  1:27 AM Discussed treatment plan with pt at bedside and pt agreed to plan.  Labs (all labs ordered are listed, but only abnormal results are displayed) Labs Reviewed  COMPREHENSIVE METABOLIC PANEL - Abnormal; Notable for the following:       Result Value   Glucose, Bld 117 (*)    Total Protein 8.5 (*)    All other components within normal limits  CBC WITH DIFFERENTIAL/PLATELET  URINALYSIS, ROUTINE W REFLEX MICROSCOPIC (NOT [facility])  I-STAT TROPOININ, ED    EKG  EKG Interpretation  Date/Time:  Monday July 17 2016 22:55:28 EST Ventricular Rate:  114 PR Interval:  172 QRS Duration: 100 QT Interval:  332 QTC Calculation: 457 R Axis:   -41 Text Interpretation:  Sinus tachycardia Left  axis deviation Abnormal ECG Confirmed by HORTON  MD, COURTNEY ([id number]) on 07/18/2016 1:06:59 AM       Radiology Dg Chest 2 View  Result Date: 07/18/2016 CLINICAL DATA:  Chest pain and dizziness tonight. EXAM: CHEST  2 VIEW COMPARISON:  07/15/2016 FINDINGS: Normal heart size and pulmonary vascularity. No focal airspace disease or consolidation in the lungs. No blunting of costophrenic angles. No pneumothorax. Tortuous aorta. Degenerative changes in the spine and shoulders. IMPRESSION: No active cardiopulmonary disease. Electronically Signed   By: Burman NievesWilliam  Stevens M.D.   On: 07/18/2016 01:10   Ct Cervical Spine Wo Contrast  Result Date: 07/18/2016 CLINICAL DATA:  Pain and throbbing over right neck and face. Numbness in left finger tips EXAM: CT CERVICAL SPINE WITHOUT CONTRAST TECHNIQUE: Multidetector CT imaging of the cervical spine was performed without intravenous contrast. Multiplanar CT image reconstructions were also generated. COMPARISON:  CT  cervical spine 07/14/2016 FINDINGS: Alignment: There is unchanged reversal of the normal cervical lordosis. No static subluxation. Skull base and vertebrae: No acute fracture. Soft tissues and spinal canal: No prevertebral fluid or swelling. No visible canal hematoma. Disc levels: No advanced spinal canal or neural foraminal stenosis. Upper chest: No pneumothorax, pulmonary nodule or pleural effusion. Other: Normal visualized paraspinal cervical soft tissues. IMPRESSION: 1. No acute fracture or static subluxation of the cervical spine. 2. No spinal canal or neural foraminal stenosis. Electronically Signed   By: Deatra RobinsonKevin  Herman M.D.   On: 07/18/2016 04:03    Procedures Procedures (including critical care time)  Medications Ordered in ED Medications  ketorolac (TORADOL) 15 MG/ML injection 15 mg (15 mg Intramuscular Given 07/18/16 0212)  diazepam (VALIUM) tablet 5 mg (5 mg Oral Given 07/18/16 0212)     Initial Impression / Assessment and Plan / ED Course  I  have reviewed the triage vital signs and the nursing notes.  Pertinent labs & imaging results that were available during my care of the patient were reviewed by me and considered in my medical decision making (see chart for details).  Clinical Course as of Jul 19 419  Tue Jul 18, 2016  [id number] Patient's initial workup from triage and reassuring. EKG and lab work normal. Patient given Valium and Toradol with some improvement of symptoms. On recheck, patient is requesting CT scan as he "thinks I may be having a stroke." When questioned further he reports that his father died of a stroke. He describes the pain in his back radiating up into his neck and head. He also describes tingling in the fourth and fifth digit of the left hand which is new. Denies any other weakness, numbness, tingling. Neurologically intact.  [CH]    Clinical Course User Index [CH] Shon Batonourtney F Horton, MD    Patient presents with concern for worsening spasm after taking Flexeril earlier today. He also reports pain radiates into his head and chest. Lab work, EKG, chest x-ray sent from triage obtained and reassuring. Initially noted to be anxious in triage but currently is calm. See clinical course above. He was given Valium and Toradol with some improvement. He didn't state a concern for stroke. He is neurologically intact. He does report numbness in the left fourth and fifth digit. Given ongoing neck symptoms, considered to radiculopathy. CT negative for injury. We'll treat with a Medrol Dosepak.  After history, exam, and medical workup I feel the patient has been appropriately medically screened and is safe for discharge home. Pertinent diagnoses were discussed with the patient. Patient was given return precautions.   Final Clinical Impressions(s) / ED Diagnoses   Final diagnoses:  Chest pain  Neck pain  Adverse effect of drug, initial encounter  Cervical radiculopathy    New Prescriptions New Prescriptions    METHYLPREDNISOLONE (MEDROL DOSEPAK) 4 MG TBPK TABLET    Take as directed on packet   I personally performed the services described in this documentation, which was scribed in my presence. The recorded information has been reviewed and is accurate.     Shon Batonourtney F Horton, MD 07/18/16 [phone]

## 2016-07-31 ENCOUNTER — Emergency Department (HOSPITAL_COMMUNITY)
Admission: EM | Admit: 2016-07-31 | Discharge: 2016-07-31 | Disposition: A | Discharge status: Home / Self Care | Payer: Self-pay | Attending: Emergency Medicine | Admitting: Emergency Medicine

## 2016-07-31 ENCOUNTER — Other Ambulatory Visit: Payer: Self-pay

## 2016-07-31 ENCOUNTER — Encounter (HOSPITAL_COMMUNITY): Payer: Self-pay | Admitting: Emergency Medicine

## 2016-07-31 ENCOUNTER — Emergency Department (HOSPITAL_COMMUNITY): Payer: Self-pay

## 2016-07-31 DIAGNOSIS — R0789 Other chest pain: Secondary | ICD-10-CM | POA: Insufficient documentation

## 2016-07-31 DIAGNOSIS — I10 Essential (primary) hypertension: Secondary | ICD-10-CM | POA: Insufficient documentation

## 2016-07-31 DIAGNOSIS — Z79899 Other long term (current) drug therapy: Secondary | ICD-10-CM | POA: Insufficient documentation

## 2016-07-31 DIAGNOSIS — M542 Cervicalgia: Secondary | ICD-10-CM | POA: Insufficient documentation

## 2016-07-31 DIAGNOSIS — F419 Anxiety disorder, unspecified: Secondary | ICD-10-CM | POA: Insufficient documentation

## 2016-07-31 HISTORY — DX: Anxiety disorder, unspecified: F41.9

## 2016-07-31 LAB — BASIC METABOLIC PANEL
Anion gap: 9 (ref 5–15)
BUN: 13 mg/dL (ref 6–20)
CO2: 27 mmol/L (ref 22–32)
Calcium: 9.5 mg/dL (ref 8.9–10.3)
Chloride: 100 mmol/L — ABNORMAL LOW (ref 101–111)
Creatinine, Ser: 0.65 mg/dL (ref 0.61–1.24)
GFR calc Af Amer: >60 mL/min (ref >60)
GFR calc non Af Amer: >60 mL/min (ref >60)
Glucose, Bld: 114 mg/dL — ABNORMAL HIGH (ref 65–99)
Potassium: 3.7 mmol/L (ref 3.5–5.1)
Sodium: 136 mmol/L (ref 135–145)

## 2016-07-31 LAB — CBC
HCT: 43.3 % (ref 39.0–52.0)
Hemoglobin: 14.5 g/dL (ref 13.0–17.0)
MCH: 29.1 pg (ref 26.0–34.0)
MCHC: 33.5 g/dL (ref 30.0–36.0)
MCV: 86.8 fL (ref 78.0–100.0)
Platelets: 303 10*3/uL (ref 150–400)
RBC: 4.99 MIL/uL (ref 4.22–5.81)
RDW: 12.4 % (ref 11.5–15.5)
WBC: 9.1 10*3/uL (ref 4.0–10.5)

## 2016-07-31 LAB — I-STAT TROPONIN, ED: Troponin i, poc: 0 ng/mL (ref 0.00–0.08)

## 2016-07-31 MED ORDER — MELOXICAM 7.5 MG PO TABS: 7.5000 mg | ORAL_TABLET | Freq: Two times a day (BID) | ORAL | 0 refills | Status: DC

## 2016-07-31 MED ORDER — CYCLOBENZAPRINE HCL 5 MG PO TABS: 5.0000 mg | ORAL_TABLET | Freq: Every day | ORAL | 0 refills | Status: DC

## 2016-07-31 NOTE — Discharge Instructions (Signed)
Take the prescribed medication as directed I would like you to take an Ativan (Lorazepam) at bedtime as well You have been give a referral to Neurology if you continue to have numbness to your left arm while laying in certain positions  Also make an appointment with your PCP forr up in about 1 week

## 2016-07-31 NOTE — ED Notes (Signed)
Pt stable, understands discharge instructions, and reasons for return.   

## 2016-07-31 NOTE — ED Provider Notes (Signed)
[facility] Provider Note   CSN: [id number] Arrival date & time: 07/31/16  1907     History   Chief Complaint Chief Complaint  Patient presents with  . Chest Pain    HPI Wesley LitterJose [name] is a 50 y.o. male.  This a 50 year old Hispanic male with a history of alcohol, cocaine abuse, recently stopped using both he's been seen numerous times for vague chest pain and paresthesias.  Presents today with a history of lying down and suddenly feeling his left arm go numb, including his third fourth, and fifth fingers.  This only occurs when he is in the dorsal position.  He was recently seen by his new primary care physician who prescribed Ativan, which he states he took an approximately 10 minutes later became very nauseated and his anxiety increased.  He is also been evaluated for this paresthesia and neck pain in the past with a normal CT scan. He has been given a muscle relaxer and pain control in the past, which he states has helped his discomfort and his insomnia.      Past Medical History:  Diagnosis Date  . Alcohol abuse   . Anxiety   . Cocaine abuse   . Hypertension     There are no active problems to display for this patient.   History reviewed. No pertinent surgical history.     Home Medications    Prior to Admission medications   Medication Sig Start Date End Date Taking? Authorizing Provider  LORazepam (ATIVAN) 1 MG tablet Take 1 mg by mouth daily as needed for anxiety.   Yes Historical Provider, MD  thiamine (VITAMIN B-1) 100 MG tablet Take 100 mg by mouth daily.   Yes Historical Provider, MD  cyclobenzaprine (FLEXERIL) 5 MG tablet Take 1 tablet (5 mg total) by mouth at bedtime. 07/31/16   Earley FavorGail Schulz, NP  hydrOXYzine (ATARAX/VISTARIL) 25 MG tablet Take 1 tablet (25 mg total) by mouth every 8 (eight) hours as needed for anxiety. Patient not taking: Reported on 07/31/2016 07/10/16   Fayrene HelperBowie Tran, PA-C  meloxicam (MOBIC) 7.5 MG tablet Take 1 tablet (7.5 mg  total) by mouth 2 (two) times daily. 07/31/16   Earley FavorGail Schulz, NP  methylPREDNISolone (MEDROL DOSEPAK) 4 MG TBPK tablet Take as directed on packet Patient not taking: Reported on 07/31/2016 07/18/16   Shon Batonourtney F Horton, MD  naproxen (NAPROSYN) 500 MG tablet Take 1 tablet (500 mg total) by mouth 2 (two) times daily. Patient not taking: Reported on 07/31/2016 07/09/16   Geoffery Lyonsouglas Delo, MD  permethrin (ELIMITE) 5 % cream Apply to affected area once Leave on for 12-14 hours, then wash off Patient not taking: Reported on 07/31/2016 07/03/16   Eber HongBrian Miller, MD  traMADol (ULTRAM) 50 MG tablet Take 1 tablet (50 mg total) by mouth every 6 (six) hours as needed. Patient not taking: Reported on 07/31/2016 04/01/16   Jaynie Crumbleatyana Kirichenko, PA-C    Family History Family History  Problem Relation Age of Onset  . Hypertension Mother   . Hypertension Father     Social History Social History  Substance Use Topics  . Smoking status: Never Smoker  . Smokeless tobacco: Never Used  . Alcohol use Yes     Allergies   Patient has no known allergies.   Review of Systems Review of Systems  Constitutional: Negative for fatigue and fever.  Respiratory: Negative for cough and shortness of breath.   Cardiovascular: Positive for chest pain. Negative for palpitations.  Neurological: Positive for numbness.  Psychiatric/Behavioral: Positive for sleep disturbance. Negative for suicidal ideas. The patient is nervous/anxious.   All other systems reviewed and are negative.    Physical Exam Updated Vital Signs BP 120/88   Pulse 88   Temp 98.2 F (36.8 C) (Oral)   Resp 24   Ht 5\' 6"  (1.676 m)   Wt 79.4 kg   SpO2 96%   BMI 28.25 kg/m   Physical Exam  Constitutional: He is oriented to person, place, and time. He appears well-developed and well-nourished.  HENT:  Head: Normocephalic.  Eyes: Pupils are equal, round, and reactive to light.  Neck: Normal range of motion. Tracheal tenderness present.     Cardiovascular: Normal rate.   Pulmonary/Chest: Effort normal.  Abdominal: Soft.  Musculoskeletal: Normal range of motion. He exhibits tenderness. He exhibits no deformity.       Arms: Neurological: He is alert and oriented to person, place, and time.  Skin: Skin is warm.  Psychiatric: His mood appears anxious.  Nursing note and vitals reviewed.    ED Treatments / Results  Labs (all labs ordered are listed, but only abnormal results are displayed) Labs Reviewed  BASIC METABOLIC PANEL - Abnormal; Notable for the following:       Result Value   Chloride 100 (*)    Glucose, Bld 114 (*)    All other components within normal limits  CBC  I-STAT TROPOININ, ED    EKG  EKG Interpretation  Date/Time:  Monday July 31 2016 19:18:19 EST Ventricular Rate:  122 PR Interval:  138 QRS Duration: 88 QT Interval:  324 QTC Calculation: 461 R Axis:   141 Text Interpretation:  Sinus tachycardia Right axis deviation Pulmonary disease pattern Abnormal ECG When compared with ECG of 07/17/2016, Right axis deviation is now Present Confirmed by [facility]  MD, DAVID ([id number]) on 07/31/2016 7:18:51 PM       Radiology Dg Chest 2 View  Result Date: 07/31/2016 CLINICAL DATA:  50 y/o M; intermittent central chest pain mild shortness of breath with emesis. EXAM: CHEST  2 VIEW COMPARISON:  07/18/2016 chest radiograph FINDINGS: The heart size and mediastinal contours are within normal limits and stable. Stable left lower lobe calcified granulomas. Both lungs are clear. The visualized skeletal structures are unremarkable. IMPRESSION: No active cardiopulmonary disease. Electronically Signed   By: Mitzi Hansen M.D.   On: 07/31/2016 19:40    Procedures Procedures (including critical care time)  Medications Ordered in ED Medications - No data to display   Initial Impression / Assessment and Plan / ED Course  I have reviewed the triage vital signs and the nursing notes.  Pertinent labs &  imaging results that were available during my care of the patient were reviewed by me and considered in my medical decision making (see chart for details).  Clinical Course      Every patient's chart, including recent CT scan, which is normal.  Today's troponin is 0.  I do not feel this is cardiac chest discomfort, but more panic attack/anxiety.  Again, we will prescribe a muscle relaxer and pain relief for his symptomatic relief of neck pain and paresthesia.  I will refer him to neurology for further evaluation impact his primary care physician.  I recommended patient taking Ativan at night as well as muscle relaxer and pain medication so that he can sleep has insomnia can exacerbate his symptoms  Final Clinical Impressions(s) / ED Diagnoses   Final diagnoses:  Anxiety  Atypical chest pain  Cervical muscle pain  New Prescriptions New Prescriptions   CYCLOBENZAPRINE (FLEXERIL) 5 MG TABLET    Take 1 tablet (5 mg total) by mouth at bedtime.   MELOXICAM (MOBIC) 7.5 MG TABLET    Take 1 tablet (7.5 mg total) by mouth 2 (two) times daily.     Earley FavorGail Schulz, NP 07/31/16 2110    Earley FavorGail Schulz, NP 07/31/16 [id number]    Mancel BaleElliott Wentz, MD 08/01/16 [phone]

## 2016-07-31 NOTE — ED Triage Notes (Signed)
Pt. reports intermittent central chest pain with mild SOB and emesis x1 this evening . Denies diaphoresis , no cough or congestion .

## 2016-08-03 ENCOUNTER — Encounter (HOSPITAL_COMMUNITY): Payer: Self-pay | Admitting: Emergency Medicine

## 2016-08-03 ENCOUNTER — Emergency Department (HOSPITAL_COMMUNITY)
Admission: EM | Admit: 2016-08-03 | Discharge: 2016-08-03 | Disposition: A | Discharge status: Home / Self Care | Payer: Self-pay | Attending: Emergency Medicine | Admitting: Emergency Medicine

## 2016-08-03 DIAGNOSIS — G4700 Insomnia, unspecified: Secondary | ICD-10-CM | POA: Insufficient documentation

## 2016-08-03 DIAGNOSIS — M79602 Pain in left arm: Secondary | ICD-10-CM | POA: Insufficient documentation

## 2016-08-03 DIAGNOSIS — F419 Anxiety disorder, unspecified: Secondary | ICD-10-CM | POA: Insufficient documentation

## 2016-08-03 DIAGNOSIS — I10 Essential (primary) hypertension: Secondary | ICD-10-CM | POA: Insufficient documentation

## 2016-08-03 MED ORDER — DIPHENHYDRAMINE HCL 25 MG PO TABS: 50.0000 mg | ORAL_TABLET | Freq: Every day | ORAL | 0 refills | Status: DC

## 2016-08-03 MED ORDER — HYDROXYZINE HCL 25 MG PO TABS: 25.0000 mg | ORAL_TABLET | Freq: Three times a day (TID) | ORAL | 0 refills | Status: DC | PRN

## 2016-08-03 MED ORDER — IBUPROFEN 800 MG PO TABS: 800.0000 mg | ORAL_TABLET | Freq: Three times a day (TID) | ORAL | 0 refills | Status: DC

## 2016-08-03 NOTE — ED Notes (Signed)
PT instructed to only use one medication before bed. PT instructed to not mix ativan, benadryl, and hydroxyzine. PT instructed to take only one of these meds within six hours of each other.

## 2016-08-03 NOTE — ED Provider Notes (Signed)
[facility] Provider Note   CSN: [id number] Arrival date & time: 08/03/16  [id number]     History   Chief Complaint Chief Complaint  Patient presents with  . Arm Pain    HPI Wesley [name] is a 50 y.o. male.  Patient is here with multiple complaints. 1. Has had arm pain for a month. It comes and goes. Has paresthesias sometimes in upper arms and sometimes in lower arm. Sometimes worse at night sometimes worse during the day. No associated symptoms. He tried Flexeril but that made him  shaky. Has tried Ativan that did not seem to help. 2. Has trouble sleeping. He does not know why symptoms only sleep for hours of sleep 1 hour. He thinks is because he is anxious. 3. Is anxious. He is on Ativan for this but this seems to help but not entirely. She also tried eating a mouthful garlic yesterday that also did not seem to help and made her have a dry mouth. 4. Has a dry mouth. See above. 5. Patient states that when its hot outside he feels hot and was cold outside he feels cold. He thinks this is abnormal. 6. Patient states when he cleans the floor with bleach he feels like it is in his mouth.       Past Medical History:  Diagnosis Date  . Alcohol abuse   . Anxiety   . Cocaine abuse   . Hypertension     There are no active problems to display for this patient.   History reviewed. No pertinent surgical history.     Home Medications    Prior to Admission medications   Medication Sig Start Date End Date Taking? Authorizing Provider  LORazepam (ATIVAN) 1 MG tablet Take 1 mg by mouth daily as needed for anxiety.   Yes Historical Provider, MD  thiamine (VITAMIN B-1) 100 MG tablet Take 100 mg by mouth daily.   Yes Historical Provider, MD  diphenhydrAMINE (BENADRYL) 25 MG tablet Take 2 tablets (50 mg total) by mouth at bedtime. 08/03/16   Marily MemosJason Mesner, MD  hydrOXYzine (ATARAX/VISTARIL) 25 MG tablet Take 1 tablet (25 mg total) by mouth every 8 (eight) hours as needed for  anxiety. 08/03/16   Marily MemosJason Mesner, MD  ibuprofen (ADVIL,MOTRIN) 800 MG tablet Take 1 tablet (800 mg total) by mouth 3 (three) times daily. 08/03/16   Marily MemosJason Mesner, MD    Family History Family History  Problem Relation Age of Onset  . Hypertension Mother   . Hypertension Father     Social History Social History  Substance Use Topics  . Smoking status: Never Smoker  . Smokeless tobacco: Never Used  . Alcohol use Yes     Allergies   Patient has no known allergies.   Review of Systems Review of Systems  All other systems reviewed and are negative.    Physical Exam Updated Vital Signs BP 127/92 (BP Location: Right Arm)   Pulse 89   Temp 97.4 F (36.3 C) (Oral)   Resp 17   Ht 5\' 6"  (1.676 m)   Wt 175 lb (79.4 kg)   SpO2 99%   BMI 28.25 kg/m   Physical Exam  Constitutional: He is oriented to person, place, and time. He appears well-developed and well-nourished.  HENT:  Head: Normocephalic and atraumatic.  Eyes: Conjunctivae and EOM are normal.  Neck: Normal range of motion.  Cardiovascular: Normal rate and regular rhythm.   Intact radial pulses in both wrists  Pulmonary/Chest: Effort normal. No respiratory  distress.  Abdominal: Soft. He exhibits no distension.  Musculoskeletal: Normal range of motion. He exhibits no edema, tenderness or deformity.  Neurological: He is alert and oriented to person, place, and time.  Normal upper extremity neurologic exam  Skin: Skin is warm and dry.  Nursing note and vitals reviewed.    ED Treatments / Results  Labs (all labs ordered are listed, but only abnormal results are displayed) Labs Reviewed - No data to display  EKG  EKG Interpretation None       Radiology No results found.  Procedures Procedures (including critical care time)  Medications Ordered in ED Medications - No data to display   Initial Impression / Assessment and Plan / ED Course  I have reviewed the triage vital signs and the nursing  notes.  Pertinent labs & imaging results that were available during my care of the patient were reviewed by me and considered in my medical decision making (see chart for details).  Clinical Course     Unsure cause of symptoms. Patient has a history of drug abuse and is hesitant to answer whether he is taking drugs now. I suspect this may be related to his multitude of complaints. He also needs to have a primary doctor to do further evaluation for his arm symptoms as suspect they could be neurologic in nature. We'll suggest Benadryl and ibuProfen for now. Has had full cardiac/PE workup recently for exact same symptoms, no indication for repeat at this time.   Final Clinical Impressions(s) / ED Diagnoses   Final diagnoses:  Insomnia, unspecified type  Anxiety  Pain of left upper extremity    New Prescriptions New Prescriptions   DIPHENHYDRAMINE (BENADRYL) 25 MG TABLET    Take 2 tablets (50 mg total) by mouth at bedtime.   IBUPROFEN (ADVIL,MOTRIN) 800 MG TABLET    Take 1 tablet (800 mg total) by mouth 3 (three) times daily.     Marily MemosJason Mesner, MD 08/03/16 [phone]

## 2016-08-03 NOTE — ED Triage Notes (Signed)
Here for c/o left arm pain.  Reports it as tingling.  Hx of back and neck issues and was taking flexeril.  Per patient can't sleep even taking the flexeril.

## 2016-08-03 NOTE — ED Notes (Signed)
ED Provider at bedside. 

## 2016-08-05 ENCOUNTER — Emergency Department (HOSPITAL_COMMUNITY)
Admission: EM | Admit: 2016-08-05 | Discharge: 2016-08-05 | Disposition: A | Discharge status: Home / Self Care | Payer: Self-pay | Attending: Dermatology | Admitting: Dermatology

## 2016-08-05 ENCOUNTER — Encounter (HOSPITAL_COMMUNITY): Payer: Self-pay | Admitting: Emergency Medicine

## 2016-08-05 DIAGNOSIS — R109 Unspecified abdominal pain: Secondary | ICD-10-CM | POA: Insufficient documentation

## 2016-08-05 DIAGNOSIS — F419 Anxiety disorder, unspecified: Secondary | ICD-10-CM | POA: Insufficient documentation

## 2016-08-05 DIAGNOSIS — I10 Essential (primary) hypertension: Secondary | ICD-10-CM | POA: Insufficient documentation

## 2016-08-05 DIAGNOSIS — Z5321 Procedure and treatment not carried out due to patient leaving prior to being seen by health care provider: Secondary | ICD-10-CM | POA: Insufficient documentation

## 2016-08-05 NOTE — ED Triage Notes (Signed)
Pt states he is unable to sleep due to his anxiety.  States he took hydroxyzine twice today but was uncertain about taking the 3rd dose for anxiety because he had also taken Ibuprofen and didn't want to take too much medication.  Pt asked to have BP taken at nurse first and was told that he would have to check in.  Once in triage room he reports abd "jumping" at times intermittently over the past week.  Once triage nurse talked to pt and he was waiting for labs he walked out and said he was leaving.  States he only wanted his blood pressure checked.

## 2016-08-05 NOTE — ED Notes (Signed)
Pt stepped out of Triage 4 and stated all he wanted was a blood pressure treatment. I told the patient we had just begun his assessment, and to please stay. Pt refused, and stated he wanted to leave.

## 2016-08-11 ENCOUNTER — Encounter (HOSPITAL_COMMUNITY): Payer: Self-pay | Admitting: Emergency Medicine

## 2016-08-11 DIAGNOSIS — Z5321 Procedure and treatment not carried out due to patient leaving prior to being seen by health care provider: Secondary | ICD-10-CM | POA: Insufficient documentation

## 2016-08-11 DIAGNOSIS — R51 Headache: Secondary | ICD-10-CM | POA: Insufficient documentation

## 2016-08-11 NOTE — ED Triage Notes (Signed)
Pt states "i took some medicine at 9am (hydroxyzine) for anxiety, and I took it at 9pm tonight and I started feeling a headache and my body hurts and my head feels dizzy" Pt in nad. Ambulatory with steady gait.

## 2016-08-12 ENCOUNTER — Emergency Department (HOSPITAL_COMMUNITY)
Admission: EM | Admit: 2016-08-12 | Discharge: 2016-08-12 | Disposition: A | Discharge status: Home / Self Care | Payer: Self-pay | Attending: Dermatology | Admitting: Dermatology

## 2016-08-12 NOTE — ED Notes (Signed)
Called pt to recheck vitals, no answer x2.

## 2016-08-12 NOTE — ED Notes (Signed)
Called to do vitals on Pt, no answer.

## 2016-08-13 ENCOUNTER — Encounter (HOSPITAL_COMMUNITY): Payer: Self-pay | Admitting: *Deleted

## 2016-08-13 ENCOUNTER — Emergency Department (HOSPITAL_COMMUNITY)
Admission: EM | Admit: 2016-08-13 | Discharge: 2016-08-13 | Disposition: A | Discharge status: Home / Self Care | Payer: Self-pay | Attending: Emergency Medicine | Admitting: Emergency Medicine

## 2016-08-13 DIAGNOSIS — Y929 Unspecified place or not applicable: Secondary | ICD-10-CM | POA: Insufficient documentation

## 2016-08-13 DIAGNOSIS — F419 Anxiety disorder, unspecified: Secondary | ICD-10-CM

## 2016-08-13 DIAGNOSIS — Y999 Unspecified external cause status: Secondary | ICD-10-CM | POA: Insufficient documentation

## 2016-08-13 DIAGNOSIS — Y939 Activity, unspecified: Secondary | ICD-10-CM | POA: Insufficient documentation

## 2016-08-13 DIAGNOSIS — R202 Paresthesia of skin: Secondary | ICD-10-CM

## 2016-08-13 DIAGNOSIS — S161XXA Strain of muscle, fascia and tendon at neck level, initial encounter: Secondary | ICD-10-CM | POA: Insufficient documentation

## 2016-08-13 DIAGNOSIS — T148XXA Other injury of unspecified body region, initial encounter: Secondary | ICD-10-CM

## 2016-08-13 DIAGNOSIS — X58XXXA Exposure to other specified factors, initial encounter: Secondary | ICD-10-CM | POA: Insufficient documentation

## 2016-08-13 DIAGNOSIS — I10 Essential (primary) hypertension: Secondary | ICD-10-CM | POA: Insufficient documentation

## 2016-08-13 LAB — URINALYSIS, ROUTINE W REFLEX MICROSCOPIC
Bilirubin Urine: NEGATIVE
Glucose, UA: NEGATIVE mg/dL
Hgb urine dipstick: NEGATIVE
Ketones, ur: NEGATIVE mg/dL
Leukocytes, UA: NEGATIVE
Nitrite: NEGATIVE
Protein, ur: NEGATIVE mg/dL
Specific Gravity, Urine: 1.021 (ref 1.005–1.030)
pH: 8 (ref 5.0–8.0)

## 2016-08-13 LAB — CBC
HCT: 43.4 % (ref 39.0–52.0)
Hemoglobin: 14.3 g/dL (ref 13.0–17.0)
MCH: 28.3 pg (ref 26.0–34.0)
MCHC: 32.9 g/dL (ref 30.0–36.0)
MCV: 85.8 fL (ref 78.0–100.0)
Platelets: 282 10*3/uL (ref 150–400)
RBC: 5.06 MIL/uL (ref 4.22–5.81)
RDW: 12.5 % (ref 11.5–15.5)
WBC: 7 10*3/uL (ref 4.0–10.5)

## 2016-08-13 LAB — BASIC METABOLIC PANEL
Anion gap: 10 (ref 5–15)
BUN: 12 mg/dL (ref 6–20)
CO2: 25 mmol/L (ref 22–32)
Calcium: 9.5 mg/dL (ref 8.9–10.3)
Chloride: 104 mmol/L (ref 101–111)
Creatinine, Ser: 0.86 mg/dL (ref 0.61–1.24)
GFR calc Af Amer: >60 mL/min (ref >60)
GFR calc non Af Amer: >60 mL/min (ref >60)
Glucose, Bld: 103 mg/dL — ABNORMAL HIGH (ref 65–99)
Potassium: 3.7 mmol/L (ref 3.5–5.1)
Sodium: 139 mmol/L (ref 135–145)

## 2016-08-13 LAB — CBG MONITORING, ED: Glucose-Capillary: 101 mg/dL — ABNORMAL HIGH (ref 65–99)

## 2016-08-13 MED ORDER — HYDROXYZINE HCL 25 MG PO TABS: 25.0000 mg | ORAL_TABLET | Freq: Four times a day (QID) | ORAL | 0 refills | Status: DC | PRN

## 2016-08-13 MED ORDER — DICLOFENAC SODIUM 1 % TD GEL: 2.0000 g | Freq: Four times a day (QID) | TRANSDERMAL | 0 refills | Status: DC | PRN

## 2016-08-13 NOTE — ED Triage Notes (Signed)
Pt. Stated, I woke up this morning and had left hand tingling and I started shaking, and dizziness like light headness

## 2016-08-13 NOTE — Discharge Instructions (Signed)
It was my pleasure taking care of you today!  It is very important that you find a primary care provider in the area. Please call the clinic listed to schedule an appointment.  Diclofenac gel to rub on neck/shoulder for muscle pain. I have provided a coupon for this medication which is good at [employer]. I have refilled your vistaril to take as needed for anxiety.  Return to ER for new or worsening symptoms, any additional concerns.

## 2016-08-13 NOTE — ED Provider Notes (Signed)
[facility]EM[facility]ENC[facility] DEPT Provider Note   CSN: [id number] Arrival date & time: 08/13/16  1132     History   Chief [id number] Chief Complaint  Patient presents with  . Dizziness  . Tingling    left hand    HPI Wesley [name] is a 50 y.o. male.  The history is provided by the patient and medical records.   Wesley [name] is a 50 y.o. male  with a PMH of HTN who presents to the Emergency Department with two complaints:  1. Left 2nd, 3rd and 4th finger tingling. Patient states he awoke this morning around 3 AM and noticed that his fingers were numb and tingling. He has had this sensation before and was told it was because of his neck, but unable to specify any further details about cost. He states that the left side of his neck gets sore when he sleeps in certain positions. This has been happening intermittently for several months now. CT of cervical spine was performed when in ED for similar complaints on 11/07 which showed no abnormalities. He also had an MR of the brain on 11/03 with no acute abnormalities seen. 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   2. Anxiety which he describes as feeling his heart race and have a very dry mouth. This has been occurring a few times a week for several months now. He takes vistaril which was prescribed to him on 11/27 and has had noticeable improvement in symptoms.   He has not followed up with PCP or specialist for either complaint. She has diagnosis of alcohol and cocaine abuse in chart. When asked about this, he states that he has not had any alcohol or drugs in a month and a half.   Appears he endorsed dizziness in triage, but he denies this to me. No chest pain or shortness of breath. No headache, slurred speech, gait abnormalities, visual changes.   Past Medical History:  Diagnosis Date  . Alcohol abuse   . Anxiety   . Cocaine abuse   . Hypertension     There are no active problems to display for this  patient.   History reviewed. No pertinent surgical history.     Home Medications    Prior to Admission medications   Medication Sig Start Date End Date Taking? Authorizing Provider  diclofenac sodium (VOLTAREN) 1 % GEL Apply 2 g topically 4 (four) times daily as needed (Muscle pain). 08/13/16   Chase PicketJaime Pilcher Ward, PA-C  diphenhydrAMINE (BENADRYL) 25 MG tablet Take 2 tablets (50 mg total) by mouth at bedtime. Patient not taking: Reported on 08/13/2016 08/03/16   Marily MemosJason Mesner, MD  hydrOXYzine (ATARAX/VISTARIL) 25 MG tablet Take 1 tablet (25 mg total) by mouth every 6 (six) hours as needed for anxiety. 08/13/16   Jaime Pilcher Ward, PA-C  ibuprofen (ADVIL,MOTRIN) 800 MG tablet Take 1 tablet (800 mg total) by mouth 3 (three) times daily. 08/03/16   Marily MemosJason Mesner, MD  LORazepam (ATIVAN) 1 MG tablet Take 1 mg by mouth daily as needed for anxiety.    Historical Provider, MD  thiamine (VITAMIN B-1) 100 MG tablet Take 100 mg by mouth daily.    Historical Provider, MD    Family History Family History  Problem Relation Age of Onset  . Hypertension Mother   . Hypertension Father     Social History Social History  Substance Use Topics  . Smoking status: Never Smoker  . Smokeless tobacco: Never Used  . Alcohol use Yes  Allergies   Patient has no known allergies.   Review of Systems Review of Systems  Constitutional: Negative for chills and fever.  HENT: Negative for congestion.   Eyes: Negative for visual disturbance.  Respiratory: Negative for cough and shortness of breath.   Cardiovascular: Positive for palpitations. Negative for chest pain and leg swelling.  Gastrointestinal: Negative for abdominal pain, nausea and vomiting.  Genitourinary: Negative for dysuria.  Musculoskeletal: Positive for neck pain. Negative for back pain.  Skin: Negative for rash.  Neurological: Positive for numbness. Negative for speech difficulty, weakness and headaches.     Physical Exam Updated  Vital Signs BP 136/79   Pulse 84   Temp 98.3 F (36.8 C) (Oral)   Resp 26   Ht 5\' 6"  (1.676 m)   Wt 79.4 kg   SpO2 98%   BMI 28.25 kg/m   Physical Exam  Constitutional: He is oriented to person, place, and time. He appears well-developed and well-nourished. No distress.  HENT:  Head: Normocephalic and atraumatic.  Eyes:  No nystagmus.   Neck:  No midline tenderness. TTP of left paraspinal musculature. Full ROM.   Cardiovascular: Normal rate, regular rhythm and normal heart sounds.   No murmur heard. Pulmonary/Chest: Effort normal and breath sounds normal. No respiratory distress. He has no wheezes. He has no rales.  Abdominal: Soft. He exhibits no distension. There is no tenderness.  Musculoskeletal:  Bilateral upper extremities with full range of motion and 5/5 muscle strength including grip strength and pincer grasp. 2+ radial pulse.   Neurological: He is alert and oriented to person, place, and time. No cranial nerve deficit.  All four extremities NVI.   Skin: Skin is warm and dry.  Nursing note and vitals reviewed.    ED Treatments / Results  Labs (all labs ordered are listed, but only abnormal results are displayed) Labs Reviewed  BASIC METABOLIC PANEL - Abnormal; Notable for the following:       Result Value   Glucose, Bld 103 (*)    All other components within normal limits  CBG MONITORING, ED - Abnormal; Notable for the following:    Glucose-Capillary 101 (*)    All other components within normal limits  CBC  URINALYSIS, ROUTINE W REFLEX MICROSCOPIC (NOT AT [facility])  CBG MONITORING, ED    EKG  EKG Interpretation  Date/Time:  Sunday August 13 2016 11:35:55 EST Ventricular Rate:  105 PR Interval:  128 QRS Duration: 96 QT Interval:  342 QTC Calculation: 452 R Axis:   -55 Text Interpretation:  Sinus tachycardia Left anterior fascicular block Minimal voltage criteria for LVH, may be normal variant No significant change since last tracing Confirmed by  Anitra Lauth  MD, Alphonzo Lemmings ([id number]) on 08/13/2016 12:15:32 PM       Radiology No results found.  Procedures Procedures (including critical care time)  Medications Ordered in ED Medications - No data to display   Initial Impression / Assessment and Plan / ED Course  I have reviewed the triage vital signs and the nursing notes.  Pertinent labs & imaging results that were available during my care of the patient were reviewed by me and considered in my medical decision making (see chart for details).  Clinical Course    Agapito Hanway is a 50 y.o. male who presents to ED for two complaints:   1. Left 2nd, 3rd and 4th finger tingling intermittently for several months now. MR brain reassuring on 11/03. CT cervical negative on 11/07. Does have tenderness to left  paraspinal musculature but no midline tenderness. No focal neuro deficits. He has been on ibuprofen for muscle spasm but no other symptomatic home care. Discussed ice/heat and stretches with patient. Will give rx for diclofenac gel. Coupon for $21 provided and patient states he can afford this rx.   2. Anxiety described as heart racing and dry mouth also occurring intermittently for several months now. Vistaril given two weeks ago for symptoms which provided adequate relief however he is out of this medication. He has had cardiac workup several times in the past when presenting with these symptoms. Does not sound cardiac in etiology today. Will refill vistaril.   Labs reviewed and unremarkable. Vitals and EKG reassuring as well. Ultimately, patient very much needs to find a pcp in the area. This was discussed with patient at bedside. [facility] information provided and patient told to schedule follow up appointment regardless of timeframe for wait. Reasons to return to the ER were discussed and all questions were answered.   Patient discussed with Dr. Anitra LauthPlunkett who agrees with treatment plan.    Final Clinical Impressions(s) / ED Diagnoses    Final diagnoses:  Tingling in extremities  Muscle strain  Anxiousness    New Prescriptions New Prescriptions   DICLOFENAC SODIUM (VOLTAREN) 1 % GEL    Apply 2 g topically 4 (four) times daily as needed (Muscle pain).   HYDROXYZINE (ATARAX/VISTARIL) 25 MG TABLET    Take 1 tablet (25 mg total) by mouth every 6 (six) hours as needed for anxiety.     Chase PicketJaime Pilcher Ward, PA-C 08/13/16 1400    Gwyneth SproutWhitney Plunkett, MD 08/13/16 [phone]

## 2016-08-17 ENCOUNTER — Encounter (HOSPITAL_COMMUNITY): Payer: Self-pay | Admitting: *Deleted

## 2016-08-17 ENCOUNTER — Emergency Department (HOSPITAL_COMMUNITY)
Admission: EM | Admit: 2016-08-17 | Discharge: 2016-08-18 | Disposition: A | Discharge status: Home / Self Care | Payer: Self-pay | Attending: Emergency Medicine | Admitting: Emergency Medicine

## 2016-08-17 DIAGNOSIS — R6883 Chills (without fever): Secondary | ICD-10-CM | POA: Insufficient documentation

## 2016-08-17 DIAGNOSIS — I10 Essential (primary) hypertension: Secondary | ICD-10-CM | POA: Insufficient documentation

## 2016-08-17 DIAGNOSIS — M5412 Radiculopathy, cervical region: Secondary | ICD-10-CM | POA: Insufficient documentation

## 2016-08-17 MED ORDER — PREDNISONE 20 MG PO TABS: ORAL_TABLET | ORAL | 0 refills | Status: DC

## 2016-08-17 NOTE — ED Triage Notes (Signed)
Pt c/o "coldness in my body after I took ibuprofen." Pt is taking 800mg  ibuprofen for neck and back pain. Has taken the same medication before without any side effects. Pt denies fevers.

## 2016-08-17 NOTE — ED Provider Notes (Signed)
[facility] Provider Note   CSN: [id number] Arrival date & time: 08/17/16  2234  By signing my name below, I, Wesley [name], attest that this documentation has been prepared under the direction and in the presence of [name], MD. Electronically signed, Wesley [name], ED Scribe. 08/17/16. 11:46 PM.  History   Chief Complaint Chief Complaint  Patient presents with  . Chills    HPI HPI Comments: Wesley [name] is a 50 y.o. male, with hx of anxiety, who presents to the Emergency Department complaining of a "cold sensation" that started this morning. Pt states that he is taking 800mg  ibuprofen for his neck and back pain and took some as normally this morning but started to have a cold sensation shortly after. He went to his car and turned his heater on  Pt states that he has taken this medication without side effects before. Pt also states that he is having pain in the left posterior neck area and that the ibuprofen has not been helping. He also complains of a feeling of exhaustion after doing short periods of exercise. No cough, vomiting, sore throat or diarrhea.   The history is provided by the patient. No language interpreter was used.    Past Medical History:  Diagnosis Date  . Alcohol abuse   . Anxiety   . Cocaine abuse   . Hypertension     There are no active problems to display for this patient.   History reviewed. No pertinent surgical history.   Home Medications    Prior to Admission medications   Medication Sig Start Date End Date Taking? Authorizing Provider  diclofenac sodium (VOLTAREN) 1 % GEL Apply 2 g topically 4 (four) times daily as needed (Muscle pain). 08/13/16   Chase PicketJaime Pilcher Ward, PA-C  diphenhydrAMINE (BENADRYL) 25 MG tablet Take 2 tablets (50 mg total) by mouth at bedtime. Patient not taking: Reported on 08/13/2016 08/03/16   Marily MemosJason Mesner, MD  hydrOXYzine (ATARAX/VISTARIL) 25 MG tablet Take 1 tablet (25 mg total) by mouth every 6 (six) hours  as needed for anxiety. 08/13/16   Jaime Pilcher Ward, PA-C  ibuprofen (ADVIL,MOTRIN) 800 MG tablet Take 1 tablet (800 mg total) by mouth 3 (three) times daily. 08/03/16   Marily MemosJason Mesner, MD  LORazepam (ATIVAN) 1 MG tablet Take 1 mg by mouth daily as needed for anxiety.    Historical Provider, MD  predniSONE (DELTASONE) 20 MG tablet 3 tabs po daily x 3 days, then 2 tabs x 3 days, then 1.5 tabs x 3 days, then 1 tab x 3 days, then 0.5 tabs x 3 days 08/17/16   [name], MD  thiamine (VITAMIN B-1) 100 MG tablet Take 100 mg by mouth daily.    Historical Provider, MD    Family History Family History  Problem Relation Age of Onset  . Hypertension Mother   . Hypertension Father     Social History Social History  Substance Use Topics  . Smoking status: Never Smoker  . Smokeless tobacco: Never Used  . Alcohol use Yes     Allergies   Patient has no known allergies.   Review of Systems Review of Systems  Constitutional: Positive for chills. Negative for fever.  HENT: Negative for sore throat.   Respiratory: Negative for cough.   Gastrointestinal: Negative for nausea and vomiting.  Musculoskeletal: Positive for neck pain (left side neck).  All other systems reviewed and are negative.    Physical Exam Updated Vital Signs BP 125/89 (BP Location:  Right Arm)   Pulse 78   Temp 98.1 F (36.7 C) (Oral)   Resp 18   SpO2 99%   Physical Exam  Constitutional: He is oriented to person, place, and time. He appears well-developed and well-nourished. No distress.  HENT:  Head: Normocephalic and atraumatic.  Right Ear: Hearing normal.  Left Ear: Hearing normal.  Nose: Nose normal.  Mouth/Throat: Oropharynx is clear and moist and mucous membranes are normal.  Eyes: Conjunctivae and EOM are normal. Pupils are equal, round, and reactive to light.  Neck: Normal range of motion. Neck supple.  Left paraspinal tenderness of the neck. Normal strength in all upper extremities.    Cardiovascular: Regular rhythm, S1 normal and S2 normal.  Exam reveals no gallop and no friction rub.   No murmur heard. Pulmonary/Chest: Effort normal and breath sounds normal. No respiratory distress. He exhibits no tenderness.  Abdominal: Soft. Normal appearance and bowel sounds are normal. There is no hepatosplenomegaly. There is no tenderness. There is no rebound, no guarding, no tenderness at McBurney's point and negative Murphy's sign. No hernia.  Musculoskeletal: Normal range of motion.  Neurological: He is alert and oriented to person, place, and time. He has normal strength. No cranial nerve deficit or sensory deficit. Coordination normal. GCS eye subscore is 4. GCS verbal subscore is 5. GCS motor subscore is 6.  Skin: Skin is warm, dry and intact. No rash noted. No cyanosis.  Psychiatric: He has a normal mood and affect. His speech is normal and behavior is normal. Thought content normal.  Nursing note and vitals reviewed.    ED Treatments / Results  DIAGNOSTIC STUDIES: Oxygen Saturation is 99% on RA, normal by my interpretation.  COORDINATION OF CARE: 11:40 PM-Discussed treatment plan with pt at bedside and pt agreed to plan.   Labs (all labs ordered are listed, but only abnormal results are displayed) Labs Reviewed - No data to display  EKG  EKG Interpretation None       Radiology No results found.  Procedures Procedures (including critical care time)  Medications Ordered in ED Medications - No data to display   Initial Impression / Assessment and Plan / ED Course  I have reviewed the triage vital signs and the nursing notes.  Pertinent labs & imaging results that were available during my care of the patient were reviewed by me and considered in my medical decision making (see chart for details).  Clinical Course    Patient presents with vague complaints of feeling cold earlier. She reports that the chills hit him suddenly. He does not have a fever. No  flu symptoms. Examination is normal currently. Patient continues to complain of neck pain with tingling of the fingers of his left hand. He has been seen for this previously and has has been extensively worked up. This is consistent with cervical radiculopathy. He has taken ibuprofen for this without relief. Will prescribe prednisone. As far as the chills go, no signs of infection at this time and does not require any further workup.  Final Clinical Impressions(s) / ED Diagnoses   Final diagnoses:  Cervical radiculopathy    New Prescriptions New Prescriptions   PREDNISONE (DELTASONE) 20 MG TABLET    3 tabs po daily x 3 days, then 2 tabs x 3 days, then 1.5 tabs x 3 days, then 1 tab x 3 days, then 0.5 tabs x 3 days  I personally performed the services described in this documentation, which was scribed in my presence. The recorded  information has been reviewed and is accurate.     [name], MD 08/17/16 2350

## 2016-08-18 NOTE — ED Notes (Signed)
Pt verbalized understanding of prednisone use and has no further questions. Pt instructed to complete full regimen of prednisone. Pt ambulatory, NAD, VSS.

## 2016-08-20 ENCOUNTER — Encounter (HOSPITAL_COMMUNITY): Payer: Self-pay | Admitting: Emergency Medicine

## 2016-08-20 ENCOUNTER — Emergency Department (HOSPITAL_COMMUNITY)
Admission: EM | Admit: 2016-08-20 | Discharge: 2016-08-20 | Disposition: A | Discharge status: Home / Self Care | Payer: Self-pay | Attending: Emergency Medicine | Admitting: Emergency Medicine

## 2016-08-20 DIAGNOSIS — Z79899 Other long term (current) drug therapy: Secondary | ICD-10-CM | POA: Insufficient documentation

## 2016-08-20 DIAGNOSIS — R200 Anesthesia of skin: Secondary | ICD-10-CM | POA: Insufficient documentation

## 2016-08-20 DIAGNOSIS — I10 Essential (primary) hypertension: Secondary | ICD-10-CM | POA: Insufficient documentation

## 2016-08-20 MED ORDER — METHOCARBAMOL 500 MG PO TABS: 500.0000 mg | ORAL_TABLET | Freq: Two times a day (BID) | ORAL | 0 refills | Status: DC

## 2016-08-20 MED ORDER — METHOCARBAMOL 500 MG PO TABS
500.0000 mg | ORAL_TABLET | Freq: Once | ORAL | Status: AC
  Administered 2016-08-20: 500 mg via ORAL
  Filled 2016-08-20: qty 1

## 2016-08-20 NOTE — ED Notes (Signed)
Pt asking for something to vomit into  He is nauseated

## 2016-08-20 NOTE — Discharge Instructions (Signed)
Take your medication as prescribed. I recommend continuing to take her prescriptions of ibuprofen and prednisone as prescribed until completed. He may also try applying ice and or heat to affected area for 15-20 minutes 3-4 times daily as he had for additional relief. Follow-up with your primary care provider within the next week for reevaluation and further management of your chronic left arm pain and numbness. Please return to the Emergency Department if symptoms worsen or new onset of fever, headache, lightheadedness, dizziness, neck stiffness, back pain, new/worsening pain, chest pain, difficult to breathing, decreased range of motion, swelling, redness.

## 2016-08-20 NOTE — ED Provider Notes (Signed)
[facility]EM[facility] DEPT Provider Note   CSN: [id number] Arrival date & time: 08/20/16  0251     History   Chief Complaint Chief Complaint  Patient presents with  . Numbness    HPI Wesley [name] is a 50 y.o. male.  HPI   Patient is a 50 year old male with history of anxiety presents the ED with complaint of left arm pain and intermittent numbness. Patient reports having intermittent pain that radiates from his left upper back/neck down his right arm. He reports having intermittent numbness to his left fingers 2 through 4. Patient reports his symptoms typically occur at night while he is sleeping and laying on his side. He notes the symptoms have been present for the past 2 months and are unchanged. Denies any recent fall, trauma or injury. Patient reports being seen in the ED multiple times since onset of symptoms. He reports taking ibuprofen as prescribed but denies relief. Patient states he was seen in the ED 2 days ago for similar symptoms, was prescribed prednisone but notes he just had the prescription filled tonight. Denies fever, chills, headache, lightheadedness, dizziness, chest pain, shortness of breath, swelling, redness, warmth.   Past Medical History:  Diagnosis Date  . Alcohol abuse   . Anxiety   . Cocaine abuse   . Hypertension     There are no active problems to display for this patient.   History reviewed. No pertinent surgical history.     Home Medications    Prior to Admission medications   Medication Sig Start Date End Date Taking? Authorizing Provider  diclofenac sodium (VOLTAREN) 1 % GEL Apply 2 g topically 4 (four) times daily as needed (Muscle pain). 08/13/16  Yes Jaime Pilcher Ward, PA-C  hydrOXYzine (ATARAX/VISTARIL) 25 MG tablet Take 1 tablet (25 mg total) by mouth every 6 (six) hours as needed for anxiety. 08/13/16  Yes Jaime Pilcher Ward, PA-C  ibuprofen (ADVIL,MOTRIN) 800 MG tablet Take 1 tablet (800 mg total) by mouth 3 (three) times daily. 08/03/16   Yes Marily Memos, MD  LORazepam (ATIVAN) 1 MG tablet Take 1 mg by mouth daily as needed for anxiety.   Yes Historical Provider, MD  thiamine (VITAMIN B-1) 100 MG tablet Take 100 mg by mouth daily.   Yes Historical Provider, MD  diphenhydrAMINE (BENADRYL) 25 MG tablet Take 2 tablets (50 mg total) by mouth at bedtime. Patient not taking: Reported on 08/20/2016 08/03/16   Marily Memos, MD  methocarbamol (ROBAXIN) 500 MG tablet Take 1 tablet (500 mg total) by mouth 2 (two) times daily. 08/20/16   Barrett Henle, PA-C  predniSONE (DELTASONE) 20 MG tablet 3 tabs po daily x 3 days, then 2 tabs x 3 days, then 1.5 tabs x 3 days, then 1 tab x 3 days, then 0.5 tabs x 3 days 08/17/16   Gilda Crease, MD    Family History Family History  Problem Relation Age of Onset  . Hypertension Mother   . Hypertension Father     Social History Social History  Substance Use Topics  . Smoking status: Never Smoker  . Smokeless tobacco: Never Used  . Alcohol use Yes     Allergies   Motrin [ibuprofen]   Review of Systems Review of Systems  Musculoskeletal: Positive for myalgias (left arm) and neck pain.  Neurological: Positive for numbness.  All other systems reviewed and are negative.    Physical Exam Updated Vital Signs BP (!) 141/119 (BP Location: Right Arm)   Pulse 85   Temp  98.5 F (36.9 C) (Oral)   Resp 25   SpO2 99%   Physical Exam  Constitutional: He is oriented to person, place, and time. He appears well-developed and well-nourished. No distress.  HENT:  Head: Normocephalic and atraumatic.  Eyes: Conjunctivae and EOM are normal. Right eye exhibits no discharge. Left eye exhibits no discharge. No scleral icterus.  Neck: Normal range of motion. Neck supple.  Cardiovascular: Normal rate, regular rhythm, normal heart sounds and intact distal pulses.   Pulmonary/Chest: Effort normal and breath sounds normal. No respiratory distress. He has no wheezes. He has no rales. He  exhibits no tenderness.  Abdominal: Soft. Bowel sounds are normal. He exhibits no distension. There is no tenderness.  Musculoskeletal: Normal range of motion. He exhibits no edema, tenderness or deformity.  No midline C, T, or L tenderness. TTP over left cervical paraspinal muscles, left upper trapezius. Full range of motion of neck and back. Full range of motion of bilateral upper and lower extremities, with 5/5 strength. Sensation grossly intact. 2+ radial and PT pulses. Cap refill <2 seconds. Patient able to stand and ambulate without assistance.    Neurological: He is alert and oriented to person, place, and time.  Skin: Skin is warm and dry. He is not diaphoretic.  Nursing note and vitals reviewed.    ED Treatments / Results  Labs (all labs ordered are listed, but only abnormal results are displayed) Labs Reviewed - No data to display  EKG  EKG Interpretation None       Radiology No results found.  Procedures Procedures (including critical care time)  Medications Ordered in ED Medications  methocarbamol (ROBAXIN) tablet 500 mg (not administered)     Initial Impression / Assessment and Plan / ED Course  I have reviewed the triage vital signs and the nursing notes.  Pertinent labs & imaging results that were available during my care of the patient were reviewed by me and considered in my medical decision making (see chart for details).  Clinical Course    Patient presents with left neck pain radiating to his left arm with intermittent numbness to left fingers 2 through 4 which he notes has been present for the past 2 months. Patient reports pain is consistent with the pain he has had over the past few months. He notes he has been seen in the ED multiple times for similar symptoms. Patient reports being seen in the ED 2 days ago for similar symptoms, was discharged home with prednisone but notes he just had it filled earlier this evening and has not started taking the  prescription yet. VSS. Exam revealed mild tenderness over left cervical paraspinal muscles, left upper trapezius. Left upper extremity neurovascularly intact. No midline spinal tenderness. Chart review shows patient had MR brain performed on 07/14/16 and CT cervical spine on 07/18/16 which were unremarkable. Patient's symptoms appear to be chronic and unchanged in nature. No neuro deficits on exam. Symptoms appear to be consistent with cervical radiculopathy. Plan to discharge patient home with muscle relaxant and advised him to continue taking his prescription of ibuprofen and prednisone as prescribed. Discussed with patient importance of following up with primary care provider for continued management of his chronic symptoms. Discussed return precautions.  Final Clinical Impressions(s) / ED Diagnoses   Final diagnoses:  Numbness    New Prescriptions New Prescriptions   METHOCARBAMOL (ROBAXIN) 500 MG TABLET    Take 1 tablet (500 mg total) by mouth 2 (two) times daily.  Barrett HenleNicole Elizabeth Nadeau, PA-C 08/20/16 0510    Layla MawKristen N Ward, DO 08/20/16 [phone]

## 2016-08-20 NOTE — ED Triage Notes (Signed)
Pt c/o numbness to L index, middle and ring fingers x 3-4 months. [facility] well as [facility]. Pt given scripts for Prednisone, Motrin, Ativan and Vistaril. Pt is only taking Motrin. Pt appears very anxious.

## 2016-08-27 ENCOUNTER — Encounter (HOSPITAL_COMMUNITY): Payer: Self-pay | Admitting: Emergency Medicine

## 2016-08-27 ENCOUNTER — Emergency Department (HOSPITAL_COMMUNITY)
Admission: EM | Admit: 2016-08-27 | Discharge: 2016-08-27 | Disposition: A | Discharge status: Home / Self Care | Payer: Self-pay | Attending: Emergency Medicine | Admitting: Emergency Medicine

## 2016-08-27 DIAGNOSIS — Z5321 Procedure and treatment not carried out due to patient leaving prior to being seen by health care provider: Secondary | ICD-10-CM | POA: Insufficient documentation

## 2016-08-27 DIAGNOSIS — I10 Essential (primary) hypertension: Secondary | ICD-10-CM | POA: Insufficient documentation

## 2016-08-27 NOTE — Care Management Note (Signed)
Case Management Note  Patient Details  Name: Wesley [name] MRN: [id number] Date of Birth: Apr 18, 1966  Subjective/Objective:   Pt OTF and not in room when CM went to speak to him about multiple EDV.                  Action/Plan: No further CM needs at present. [facility] and PCP follow up.    Expected Discharge Date:                  Expected Discharge Plan:     In-House Referral:     Discharge planning Services     Post Acute Care Choice:    Choice offered to:     DME Arranged:    DME Agency:     HH Arranged:    HH Agency:     Status of Service:     If discussed at [employer] Meetings, dates discussed:    Additional Comments:  Yvone NeuCrutchfield, Genese M, RN 08/27/2016, 12:38 PM

## 2016-08-27 NOTE — ED Notes (Signed)
Pt called for triage x2 with no response.  

## 2016-08-27 NOTE — ED Triage Notes (Signed)
Pt sts feels like his BP is elevated and here to check it

## 2016-09-09 ENCOUNTER — Encounter (HOSPITAL_COMMUNITY): Payer: Self-pay | Admitting: Emergency Medicine

## 2016-09-09 ENCOUNTER — Emergency Department (HOSPITAL_COMMUNITY)
Admission: EM | Admit: 2016-09-09 | Discharge: 2016-09-09 | Disposition: A | Discharge status: Home / Self Care | Payer: Self-pay | Attending: Dermatology | Admitting: Dermatology

## 2016-09-09 DIAGNOSIS — I10 Essential (primary) hypertension: Secondary | ICD-10-CM | POA: Insufficient documentation

## 2016-09-09 DIAGNOSIS — M542 Cervicalgia: Secondary | ICD-10-CM | POA: Insufficient documentation

## 2016-09-09 DIAGNOSIS — Z5321 Procedure and treatment not carried out due to patient leaving prior to being seen by health care provider: Secondary | ICD-10-CM | POA: Insufficient documentation

## 2016-09-09 NOTE — ED Notes (Signed)
Pt called for vital signs check and did not answer

## 2016-09-09 NOTE — ED Notes (Signed)
Pt called to go back to room and did not answer.

## 2016-09-09 NOTE — ED Triage Notes (Signed)
Pt c/o possible high blood pressure. Pt c/o pain to back of neck onset 0700 today.

## 2016-09-10 ENCOUNTER — Emergency Department (HOSPITAL_COMMUNITY)
Admission: EM | Admit: 2016-09-10 | Discharge: 2016-09-10 | Disposition: A | Discharge status: Home / Self Care | Payer: Self-pay | Attending: Physician Assistant | Admitting: Physician Assistant

## 2016-09-10 ENCOUNTER — Emergency Department (HOSPITAL_COMMUNITY): Payer: Self-pay

## 2016-09-10 ENCOUNTER — Encounter (HOSPITAL_COMMUNITY): Payer: Self-pay

## 2016-09-10 DIAGNOSIS — R0602 Shortness of breath: Secondary | ICD-10-CM | POA: Insufficient documentation

## 2016-09-10 DIAGNOSIS — M25512 Pain in left shoulder: Secondary | ICD-10-CM | POA: Insufficient documentation

## 2016-09-10 DIAGNOSIS — Z79899 Other long term (current) drug therapy: Secondary | ICD-10-CM | POA: Insufficient documentation

## 2016-09-10 DIAGNOSIS — M7918 Myalgia, other site: Secondary | ICD-10-CM

## 2016-09-10 DIAGNOSIS — M791 Myalgia: Secondary | ICD-10-CM | POA: Insufficient documentation

## 2016-09-10 LAB — URINALYSIS, ROUTINE W REFLEX MICROSCOPIC
Bacteria, UA: NONE SEEN
Bilirubin Urine: NEGATIVE
Glucose, UA: NEGATIVE mg/dL
Hgb urine dipstick: NEGATIVE
Ketones, ur: NEGATIVE mg/dL
Leukocytes, UA: NEGATIVE
Nitrite: NEGATIVE
Protein, ur: NEGATIVE mg/dL
Specific Gravity, Urine: 1.013 (ref 1.005–1.030)
Squamous Epithelial / LPF: NONE SEEN
pH: 7 (ref 5.0–8.0)

## 2016-09-10 LAB — CBC WITH DIFFERENTIAL/PLATELET
Basophils Absolute: 0 10*3/uL (ref 0.0–0.1)
Basophils Relative: 0 %
Eosinophils Absolute: 0.2 10*3/uL (ref 0.0–0.7)
Eosinophils Relative: 3 %
HCT: 42.3 % (ref 39.0–52.0)
Hemoglobin: 14.2 g/dL (ref 13.0–17.0)
Lymphocytes Relative: 32 %
Lymphs Abs: 2.3 10*3/uL (ref 0.7–4.0)
MCH: 28.6 pg (ref 26.0–34.0)
MCHC: 33.6 g/dL (ref 30.0–36.0)
MCV: 85.3 fL (ref 78.0–100.0)
Monocytes Absolute: 0.4 10*3/uL (ref 0.1–1.0)
Monocytes Relative: 6 %
Neutro Abs: 4.3 10*3/uL (ref 1.7–7.7)
Neutrophils Relative %: 59 %
Platelets: 260 10*3/uL (ref 150–400)
RBC: 4.96 MIL/uL (ref 4.22–5.81)
RDW: 12.8 % (ref 11.5–15.5)
WBC: 7.2 10*3/uL (ref 4.0–10.5)

## 2016-09-10 LAB — I-STAT TROPONIN, ED
Troponin i, poc: 0 ng/mL (ref 0.00–0.08)
Troponin i, poc: 0 ng/mL (ref 0.00–0.08)

## 2016-09-10 LAB — BRAIN NATRIURETIC PEPTIDE: B Natriuretic Peptide: 7.2 pg/mL (ref 0.0–100.0)

## 2016-09-10 LAB — BASIC METABOLIC PANEL
Anion gap: 10 (ref 5–15)
BUN: 11 mg/dL (ref 6–20)
CO2: 26 mmol/L (ref 22–32)
Calcium: 9.8 mg/dL (ref 8.9–10.3)
Chloride: 100 mmol/L — ABNORMAL LOW (ref 101–111)
Creatinine, Ser: 0.69 mg/dL (ref 0.61–1.24)
GFR calc Af Amer: >60 mL/min (ref >60)
GFR calc non Af Amer: >60 mL/min (ref >60)
Glucose, Bld: 100 mg/dL — ABNORMAL HIGH (ref 65–99)
Potassium: 4 mmol/L (ref 3.5–5.1)
Sodium: 136 mmol/L (ref 135–145)

## 2016-09-10 MED ORDER — CYCLOBENZAPRINE HCL 10 MG PO TABS
5.0000 mg | ORAL_TABLET | Freq: Once | ORAL | Status: AC
Start: 2016-09-10 — End: 2016-09-10
  Administered 2016-09-10: 5 mg via ORAL
  Filled 2016-09-10: qty 1

## 2016-09-10 MED ORDER — CYCLOBENZAPRINE HCL 10 MG PO TABS: 10.0000 mg | ORAL_TABLET | Freq: Two times a day (BID) | ORAL | 0 refills | Status: DC | PRN

## 2016-09-10 NOTE — ED Notes (Signed)
Pt A&OX4, ambulatory to room B19 with steady gait. Pt placed in hospital gown.

## 2016-09-10 NOTE — Discharge Instructions (Signed)
Continues take medication presviously prescribed your pain. Please call the number as noted above to get established with a primary doctor who can continue to manage the pain that you're having in her back and in her shoulders.

## 2016-09-10 NOTE — ED Provider Notes (Signed)
[facility] Provider Note   CSN: [id number] Arrival date & time: 09/10/16  1442     History   Chief Complaint Chief Complaint  Patient presents with  . Arm Pain    HPI Wesley [name] is a 50 y.o. male.  The history is provided by the patient and medical records. No language interpreter was used.     Patient is a 50 year old male with a past medical history of alcohol abuse, anxiety who presents today with left shoulder pain with pain radiating into the left arm and across the back. Patient states it worsened today. Describes it as spasm-like pain especially in his upper back bilaterally. No numbness or tingling associated with this. States the only difference of 20 prior pain is at its in his bicep today. He also endorses 2 weeks of worsening fatigue on exertion. He has not had any chest pain. No fevers, chills, abdominal pain, hematuria, dysuria, diarrhea. Has been taking Valium prescribed by his primary doctor which she states does help somewhat with these symptoms. He has been seen in the past for similar symptoms and has even had MRIs of his head and his cervical spine which were without any findings would reflect the patient's symptoms. Patient states the pain is present even at rest.  Past Medical History:  Diagnosis Date  . Alcohol abuse   . Anxiety   . Cocaine abuse     There are no active problems to display for this patient.   History reviewed. No pertinent surgical history.     Home Medications    Prior to Admission medications   Medication Sig Start Date End Date Taking? Authorizing Provider  cyclobenzaprine (FLEXERIL) 10 MG tablet Take 1 tablet (10 mg total) by mouth 2 (two) times daily as needed for muscle spasms. 09/10/16   Madolyn Frieze, MD  diclofenac sodium (VOLTAREN) 1 % GEL Apply 2 g topically 4 (four) times daily as needed (Muscle pain). 08/13/16   Chase Picket Ward, PA-C  diphenhydrAMINE (BENADRYL) 25 MG tablet Take 2 tablets (50 mg total) by  mouth at bedtime. Patient not taking: Reported on 08/20/2016 08/03/16   Marily Memos, MD  hydrOXYzine (ATARAX/VISTARIL) 25 MG tablet Take 1 tablet (25 mg total) by mouth every 6 (six) hours as needed for anxiety. 08/13/16   Jaime Pilcher Ward, PA-C  ibuprofen (ADVIL,MOTRIN) 800 MG tablet Take 1 tablet (800 mg total) by mouth 3 (three) times daily. 08/03/16   Marily Memos, MD  LORazepam (ATIVAN) 1 MG tablet Take 1 mg by mouth daily as needed for anxiety.    Historical Provider, MD  methocarbamol (ROBAXIN) 500 MG tablet Take 1 tablet (500 mg total) by mouth 2 (two) times daily. 08/20/16   Barrett Henle, PA-C  predniSONE (DELTASONE) 20 MG tablet 3 tabs po daily x 3 days, then 2 tabs x 3 days, then 1.5 tabs x 3 days, then 1 tab x 3 days, then 0.5 tabs x 3 days 08/17/16   Gilda Crease, MD  thiamine (VITAMIN B-1) 100 MG tablet Take 100 mg by mouth daily.    Historical Provider, MD    Family History Family History  Problem Relation Age of Onset  . Hypertension Mother   . Hypertension Father     Social History Social History  Substance Use Topics  . Smoking status: Never Smoker  . Smokeless tobacco: Never Used  . Alcohol use No     Allergies   Motrin [ibuprofen]   Review of Systems Review of Systems  Constitutional: Positive for fatigue (worsening with exertion). Negative for chills and fever.  HENT: Negative for congestion.   Respiratory: Negative for cough and shortness of breath.   Cardiovascular: Negative for chest pain and leg swelling.  Gastrointestinal: Negative for abdominal distention, diarrhea, nausea and vomiting.  Genitourinary: Negative for dysuria and hematuria.  Musculoskeletal: Positive for myalgias (upper back bilaterally, L>R, radiates into L shoudler and proximal arm).  Neurological: Negative for weakness and numbness.  All other systems reviewed and are negative.    Physical Exam Updated Vital Signs BP 113/90   Pulse 77   Temp 98.1 F (36.7  C) (Oral)   Resp 17   Ht 5\' 6"  (1.676 m)   Wt 77.1 kg   SpO2 100%   BMI 27.44 kg/m   Physical Exam  Constitutional: He is oriented to person, place, and time. He appears well-developed and well-nourished. No distress.  HENT:  Head: Normocephalic and atraumatic.  Right Ear: External ear normal.  Left Ear: External ear normal.  Eyes: Conjunctivae and EOM are normal.  Right pupil defecit  Neck: Normal range of motion. Neck supple.  Cardiovascular: Normal rate and regular rhythm.   No murmur heard. Pulmonary/Chest: Effort normal and breath sounds normal. No respiratory distress.  Abdominal: Soft. He exhibits no distension. There is no tenderness.  Musculoskeletal: He exhibits no edema.  No midline C spine ttp. Pain reproducible in bilateral thoracic paraspinal muscles  Neurological: He is alert and oriented to person, place, and time. He has normal strength. No cranial nerve deficit (CN II-XII intact) or sensory deficit. Coordination normal. GCS eye subscore is 4. GCS verbal subscore is 5. GCS motor subscore is 6.  Skin: Skin is warm and dry. He is not diaphoretic.  Psychiatric: He has a normal mood and affect.  Nursing note and vitals reviewed.    ED Treatments / Results  Labs (all labs ordered are listed, but only abnormal results are displayed) Labs Reviewed  BASIC METABOLIC PANEL - Abnormal; Notable for the following:       Result Value   Chloride 100 (*)    Glucose, Bld 100 (*)    All other components within normal limits  URINALYSIS, ROUTINE W REFLEX MICROSCOPIC - Abnormal; Notable for the following:    Color, Urine STRAW (*)    All other components within normal limits  CBC WITH DIFFERENTIAL/PLATELET  BRAIN NATRIURETIC PEPTIDE  I-STAT TROPOININ, ED  I-STAT TROPOININ, ED    EKG  EKG Interpretation None       Radiology Dg Chest Portable 1 View  Result Date: 09/10/2016 CLINICAL DATA:  Chest pain and shortness of breath beginning yesterday. EXAM: PORTABLE  CHEST 1 VIEW COMPARISON:  07/31/2016 FINDINGS: Heart size at the upper limits of normal. Unfolding of the aorta. The vascularity is normal. Lungs are clear. No effusions. No acute bone finding. IMPRESSION: No active disease. Borderline cardiac enlargement. Aortic atherosclerosis. Electronically Signed   By: Paulina FusiMark  Shogry M.D.   On: 09/10/2016 17:27    Procedures Procedures (including critical care time)  Medications Ordered in ED Medications  cyclobenzaprine (FLEXERIL) tablet 5 mg (5 mg Oral Given 09/10/16 2033)     Initial Impression / Assessment and Plan / ED Course  I have reviewed the triage vital signs and the nursing notes.  Pertinent labs & imaging results that were available during my care of the patient were reviewed by me and considered in my medical decision making (see chart for details).  Clinical Course  Patient is a 50 year old male with a past medical history of alcohol abuse, anxiety who presents today with left shoulder pain with pain radiating into the left arm and across the back. On exam the patient does have reproducible pain across the shoulders and the paraspinal region. He has full range of motion of his upper extremities bilaterally. He is neurovascularly intact in both extremities. Denies any chest pain currently. Given concern that this could be anginal equivalent we obtained an EKG which did not demonstrate any acute ischemic changes. Additionally obtained serial troponins, both which were negative. At this point that it is unlikely to be cardiac related.  Given the likelihood that this is muscle or skeletal etiology. Place the patient back on Flexeril. Advised him to follow up with the primary doctor and he was given number to call to get established with the care of a primary doctor. The patient was discharged home in good condition.  Final Clinical Impressions(s) / ED Diagnoses   Final diagnoses:  Acute pain of left shoulder  Pain of paraspinal muscle     New Prescriptions Discharge Medication List as of 09/10/2016 10:49 PM       Madolyn FriezeVijay Nagpal, MD 09/11/16 1442    Courteney Lyn Mackuen, MD 09/13/16 1921

## 2016-09-10 NOTE — ED Triage Notes (Signed)
Onset 2 hours PTA left upper arm pain.  This morning c/o posterior neck pain, no neck pain at this time.

## 2016-09-20 ENCOUNTER — Ambulatory Visit: Payer: Self-pay | Attending: Internal Medicine | Admitting: Physician Assistant

## 2016-09-20 VITALS — BP 125/87 | HR 90 | Temp 98.0°F | Resp 20 | Wt 191.0 lb

## 2016-09-20 DIAGNOSIS — F411 Generalized anxiety disorder: Secondary | ICD-10-CM

## 2016-09-20 DIAGNOSIS — G4700 Insomnia, unspecified: Secondary | ICD-10-CM | POA: Insufficient documentation

## 2016-09-20 DIAGNOSIS — M546 Pain in thoracic spine: Secondary | ICD-10-CM

## 2016-09-20 DIAGNOSIS — G8929 Other chronic pain: Secondary | ICD-10-CM

## 2016-09-20 DIAGNOSIS — F419 Anxiety disorder, unspecified: Secondary | ICD-10-CM | POA: Insufficient documentation

## 2016-09-20 DIAGNOSIS — Z79899 Other long term (current) drug therapy: Secondary | ICD-10-CM | POA: Insufficient documentation

## 2016-09-20 DIAGNOSIS — M25512 Pain in left shoulder: Secondary | ICD-10-CM | POA: Insufficient documentation

## 2016-09-20 DIAGNOSIS — M549 Dorsalgia, unspecified: Secondary | ICD-10-CM | POA: Insufficient documentation

## 2016-09-20 MED ORDER — DICLOFENAC SODIUM 1 % TD GEL: 2.0000 g | Freq: Four times a day (QID) | TRANSDERMAL | 1 refills | Status: DC | PRN

## 2016-09-20 MED ORDER — FLUOXETINE HCL 20 MG PO TABS: 20.0000 mg | ORAL_TABLET | Freq: Every day | ORAL | 1 refills | Status: DC

## 2016-09-20 MED ORDER — METHOCARBAMOL 500 MG PO TABS: 500.0000 mg | ORAL_TABLET | Freq: Two times a day (BID) | ORAL | 0 refills | Status: DC

## 2016-09-20 MED ORDER — TRAZODONE HCL 50 MG PO TABS: 50.0000 mg | ORAL_TABLET | Freq: Every day | ORAL | 1 refills | Status: DC

## 2016-09-20 MED FILL — VOLTAREN 1% GEL: 1 | 18 days supply | Qty: 100 | Fill #0

## 2016-09-20 MED FILL — METHOCARBAMOL 500 MG TABLET: 500 | 15 days supply | Qty: 30 | Fill #0

## 2016-09-20 MED FILL — FLUoxetine HCL 20 MG CAPS: 20 | 30 days supply | Qty: 30 | Fill #0

## 2016-09-20 MED FILL — traZODone HCL 50 MG TABS: 50 | 30 days supply | Qty: 30 | Fill #0

## 2016-09-20 NOTE — Progress Notes (Signed)
Wesley [name]  [id number]  [id number]  DOB - 09/14/1965  Chief Complaint  Patient presents with  . Shoulder Pain  . Follow-up       Subjective:   Wesley [name] is a 51 y.o. male here today for establishment of care. He has A history of alcohol and cocaine use. He also has a history of hypertension. He has a history of anxiety. He has been cocaine and alcohol free for several months per her report. He's had multiple emergency department visits over the last 6 months. Most of the times for atypical chest pain, paresthesia, and anxiety. He also intermittently complains of left shoulder and left arm pain. It sounds like he has muscle spasms. He has been intolerant of Flexeril in the past. His most recent ED visit was on 09/10/2016 and this is for left shoulder and left arm pain. He was treated with anti-inflammatory gel and muscle relaxers with temporary improvement.  Today he complains of feeling nervous and anxious lots of times. He takes Ativan with good temporary relief but his symptoms then returned. He doesn't sleep well at night. No chest pain. Continues with the spasms in his back. Not currently working. Nonsmoker.  ROS: GEN: denies fever or chills, denies change in weight Skin: denies lesions or rashes HEENT: denies headache, earache, epistaxis, sore throat, or neck pain LUNGS: denies SHOB, dyspnea, PND, orthopnea CV: denies CP or palpitations ABD: denies abd pain, N or V EXT: + muscle spasms or swelling; no pain in lower ext, no weakness NEURO: + numbness or tingling, denies sz, stroke or TIA  ALLERGIES: Allergies  Allergen Reactions  . Motrin [Ibuprofen] Other (See Comments)    Makes his throat get "very dry" after taking it    PAST MEDICAL HISTORY: Past Medical History:  Diagnosis Date  . Alcohol abuse   . Anxiety   . Cocaine abuse     PAST SURGICAL HISTORY: No past surgical history on file.  MEDICATIONS AT HOME: Prior to Admission medications     Medication Sig Start Date End Date Taking? Authorizing Provider  hydrOXYzine (ATARAX/VISTARIL) 25 MG tablet Take 1 tablet (25 mg total) by mouth every 6 (six) hours as needed for anxiety. 08/13/16  Yes Jaime Pilcher Ward, PA-C  LORazepam (ATIVAN) 1 MG tablet Take 1 mg by mouth daily as needed for anxiety.   Yes Historical Provider, MD  cyclobenzaprine (FLEXERIL) 10 MG tablet Take 1 tablet (10 mg total) by mouth 2 (two) times daily as needed for muscle spasms. Patient not taking: Reported on 09/20/2016 09/10/16   Madolyn FriezeVijay Nagpal, MD  diclofenac sodium (VOLTAREN) 1 % GEL Apply 2 g topically 4 (four) times daily as needed (Muscle pain). 09/20/16   Tiffany Netta CedarsS Noel, PA-C  diphenhydrAMINE (BENADRYL) 25 MG tablet Take 2 tablets (50 mg total) by mouth at bedtime. Patient not taking: Reported on 09/20/2016 08/03/16   Marily MemosJason Mesner, MD  FLUoxetine (PROZAC) 20 MG tablet Take 1 tablet (20 mg total) by mouth daily. 09/20/16   Tiffany Netta CedarsS Noel, PA-C  ibuprofen (ADVIL,MOTRIN) 800 MG tablet Take 1 tablet (800 mg total) by mouth 3 (three) times daily. Patient not taking: Reported on 09/20/2016 08/03/16   Marily MemosJason Mesner, MD  methocarbamol (ROBAXIN) 500 MG tablet Take 1 tablet (500 mg total) by mouth 2 (two) times daily. 09/20/16   Tiffany Netta CedarsS Noel, PA-C  predniSONE (DELTASONE) 20 MG tablet 3 tabs po daily x 3 days, then 2 tabs x 3 days, then 1.5 tabs x 3 days, then 1  tab x 3 days, then 0.5 tabs x 3 days Patient not taking: Reported on 09/20/2016 08/17/16   Gilda Crease, MD  thiamine (VITAMIN B-1) 100 MG tablet Take 100 mg by mouth daily.    Historical Provider, MD  traZODone (DESYREL) 50 MG tablet Take 1 tablet (50 mg total) by mouth at bedtime. 09/20/16   Vivianne Master, PA-C     Objective:   Vitals:   09/20/16 0951  BP: 125/87  Pulse: 90  Resp: 20  Temp: 98 F (36.7 C)  TempSrc: Oral  SpO2: 97%  Weight: 191 lb (86.6 kg)    Exam-benign General appearance : Awake, alert, not in any distress. Speech Clear. Not  toxic looking HEENT: Atraumatic and Normocephalic, pupils equally reactive to light and accomodation Neck: supple, no JVD. No cervical lymphadenopathy.  Chest:Good air entry bilaterally, no added sounds  CVS: S1 S2 regular, no murmurs.  Abdomen: Bowel sounds present, Non tender and not distended with no gaurding, rigidity or rebound. Extremities: B/L Lower Ext shows no edema, both legs are warm to touch Neurology: Awake alert, and oriented X 3, CN II-XII intact, Non focal Skin:No Rash Wounds:N/A  Assessment & Plan  1. Left shoulder/back pain-sounds MSK  -refill Voltaren gel and Robaxin  -ICY hot/rest 2. Anxiety  -Prozac   -cont Ativan as needed 3. Insomnia  -Cont Vistaril prn  -Trazadone   Return in about 2 weeks (around 10/04/2016).  The patient was given clear instructions to go to ER or return to medical center if symptoms don't improve, worsen or new problems develop. The patient verbalized understanding. The patient was told to call to get lab results if they haven't heard anything in the next week.   This note has been created with Education officer, environmental. Any transcriptional errors are unintentional.    Scot Jun, PA-C [facility] [place], [place] [phone]   09/20/2016, 10:07 AM

## 2016-09-20 NOTE — Progress Notes (Signed)
Hospital follow up. Upper shoulder/back pain too x 2 months

## 2016-09-21 ENCOUNTER — Encounter (HOSPITAL_COMMUNITY): Payer: Self-pay | Admitting: Emergency Medicine

## 2016-09-21 ENCOUNTER — Emergency Department (HOSPITAL_COMMUNITY)
Admission: EM | Admit: 2016-09-21 | Discharge: 2016-09-21 | Disposition: A | Discharge status: Home / Self Care | Payer: Self-pay | Attending: Dermatology | Admitting: Dermatology

## 2016-09-21 DIAGNOSIS — R208 Other disturbances of skin sensation: Secondary | ICD-10-CM | POA: Insufficient documentation

## 2016-09-21 DIAGNOSIS — Z5321 Procedure and treatment not carried out due to patient leaving prior to being seen by health care provider: Secondary | ICD-10-CM | POA: Insufficient documentation

## 2016-09-21 NOTE — ED Notes (Signed)
Pt informed RN when she told him phlebotomy would be coming that he only came to the ED for get his BP checked.  This RN explained process for checking in and having vitals checked. This RN encouraged patient to use [facility] or [facility] in future.  Pt states he will follow up with his PCP since his BP was "normal".   This RN explained diastolic was high, pt sts "that's normal for me".

## 2016-09-21 NOTE — ED Triage Notes (Signed)
Pt presents to ED because he states he was in the bathroom after a shower and he began to feel hot in his face and "all over".  Pt denies dizziness, denies light-headedness, denies chest pain or SOB, denies n/v/d.  Pt states he is currently taking muscle relaxers for his chronic back problems.  Pt denies any other associated symptoms.

## 2016-09-23 ENCOUNTER — Emergency Department (HOSPITAL_COMMUNITY)
Admission: EM | Admit: 2016-09-23 | Discharge: 2016-09-23 | Disposition: A | Discharge status: Home / Self Care | Payer: Self-pay | Attending: Dermatology | Admitting: Dermatology

## 2016-09-23 ENCOUNTER — Encounter (HOSPITAL_COMMUNITY): Payer: Self-pay | Admitting: Emergency Medicine

## 2016-09-23 DIAGNOSIS — I10 Essential (primary) hypertension: Secondary | ICD-10-CM | POA: Insufficient documentation

## 2016-09-23 DIAGNOSIS — Z5321 Procedure and treatment not carried out due to patient leaving prior to being seen by health care provider: Secondary | ICD-10-CM | POA: Insufficient documentation

## 2016-09-23 NOTE — ED Triage Notes (Signed)
Pt. Stated, My mouth was very dry and I think my BP is up

## 2016-09-23 NOTE — ED Notes (Signed)
No answer from pt in lobby 

## 2016-09-23 NOTE — ED Triage Notes (Signed)
Pt. Stated, I was here 3 days ago for the same. Pt. Concerned about mouth being dry and has Valium , could cause my mouth to be dry.

## 2016-10-06 ENCOUNTER — Emergency Department (HOSPITAL_COMMUNITY)
Admission: EM | Admit: 2016-10-06 | Discharge: 2016-10-06 | Disposition: A | Discharge status: Home / Self Care | Payer: Self-pay | Attending: Dermatology | Admitting: Dermatology

## 2016-10-06 ENCOUNTER — Encounter (HOSPITAL_COMMUNITY): Payer: Self-pay | Admitting: Emergency Medicine

## 2016-10-06 DIAGNOSIS — Z79899 Other long term (current) drug therapy: Secondary | ICD-10-CM | POA: Insufficient documentation

## 2016-10-06 DIAGNOSIS — I10 Essential (primary) hypertension: Secondary | ICD-10-CM | POA: Insufficient documentation

## 2016-10-06 DIAGNOSIS — Z5321 Procedure and treatment not carried out due to patient leaving prior to being seen by health care provider: Secondary | ICD-10-CM | POA: Insufficient documentation

## 2016-10-06 NOTE — ED Notes (Signed)
Called with no answer in lobby

## 2016-10-06 NOTE — ED Notes (Signed)
Having called multiple times with no answer in lobby.

## 2016-10-06 NOTE — ED Triage Notes (Signed)
Patient arrives worried about his blood pressure. States he awoke this AM with dry mouth and was concerned that his blood pressure may be high.

## 2016-10-08 ENCOUNTER — Emergency Department (HOSPITAL_COMMUNITY)
Admission: EM | Admit: 2016-10-08 | Discharge: 2016-10-09 | Disposition: A | Discharge status: Home / Self Care | Payer: Self-pay | Attending: Emergency Medicine | Admitting: Emergency Medicine

## 2016-10-08 ENCOUNTER — Encounter (HOSPITAL_COMMUNITY): Payer: Self-pay | Admitting: *Deleted

## 2016-10-08 ENCOUNTER — Emergency Department (HOSPITAL_COMMUNITY)
Admission: EM | Admit: 2016-10-08 | Discharge: 2016-10-08 | Disposition: A | Discharge status: Home / Self Care | Payer: Self-pay | Attending: Emergency Medicine | Admitting: Emergency Medicine

## 2016-10-08 ENCOUNTER — Encounter (HOSPITAL_COMMUNITY): Payer: Self-pay | Admitting: Emergency Medicine

## 2016-10-08 DIAGNOSIS — M542 Cervicalgia: Secondary | ICD-10-CM | POA: Insufficient documentation

## 2016-10-08 DIAGNOSIS — Z5321 Procedure and treatment not carried out due to patient leaving prior to being seen by health care provider: Secondary | ICD-10-CM | POA: Insufficient documentation

## 2016-10-08 DIAGNOSIS — F419 Anxiety disorder, unspecified: Secondary | ICD-10-CM | POA: Insufficient documentation

## 2016-10-08 DIAGNOSIS — M546 Pain in thoracic spine: Secondary | ICD-10-CM | POA: Insufficient documentation

## 2016-10-08 NOTE — ED Notes (Signed)
Called pt's name to obtain vital signs, no one answered. 

## 2016-10-08 NOTE — ED Notes (Signed)
Pt called for room placement x3. No answer.

## 2016-10-08 NOTE — ED Triage Notes (Signed)
Pt reports being seen this am for "feeling hot and flushed" pt left prior to being seen by a provider. Pt reports having neck pain/upper back pain that he recently started taking robaxin for and pt is afraid this feeling is a reaction to the medication. Also is out of his valium for his anxiety and is feeling very "tense,' no acute distress is noted at triage,.

## 2016-10-08 NOTE — ED Notes (Signed)
Unable to locate Pt in Lobby.

## 2016-10-08 NOTE — ED Triage Notes (Signed)
Pt c/o 7/10 upper back pain and neck pain with some numbness on his hands since yesterday, states had a fall from a roof 4 months ago.

## 2016-10-08 NOTE — ED Triage Notes (Signed)
I went to front lobby to room PT. Pt not present in Lobby. Only 3 Pt were sitting in lobby. This RN went to each person to check arm bands.  Unable to find PT.

## 2016-10-12 ENCOUNTER — Encounter (HOSPITAL_COMMUNITY): Payer: Self-pay | Admitting: Emergency Medicine

## 2016-10-12 ENCOUNTER — Emergency Department (HOSPITAL_COMMUNITY)
Admission: EM | Admit: 2016-10-12 | Discharge: 2016-10-12 | Disposition: A | Discharge status: Home / Self Care | Payer: Self-pay | Attending: Dermatology | Admitting: Dermatology

## 2016-10-12 DIAGNOSIS — Z5321 Procedure and treatment not carried out due to patient leaving prior to being seen by health care provider: Secondary | ICD-10-CM | POA: Insufficient documentation

## 2016-10-12 DIAGNOSIS — I10 Essential (primary) hypertension: Secondary | ICD-10-CM | POA: Insufficient documentation

## 2016-10-12 DIAGNOSIS — Z79899 Other long term (current) drug therapy: Secondary | ICD-10-CM | POA: Insufficient documentation

## 2016-10-12 NOTE — ED Triage Notes (Signed)
Pt came in today w/ concerns about his high blood pressure and "a very hot feeling in my head".  He reports also feeling nervous and dry mouth.  Was seen three days ago for the same and reports taking medications

## 2016-10-14 ENCOUNTER — Emergency Department (HOSPITAL_COMMUNITY)
Admission: EM | Admit: 2016-10-14 | Discharge: 2016-10-14 | Disposition: A | Discharge status: Home / Self Care | Payer: Self-pay | Attending: Emergency Medicine | Admitting: Emergency Medicine

## 2016-10-14 ENCOUNTER — Encounter (HOSPITAL_COMMUNITY): Payer: Self-pay | Admitting: Emergency Medicine

## 2016-10-14 ENCOUNTER — Emergency Department (HOSPITAL_COMMUNITY)
Admission: EM | Admit: 2016-10-14 | Discharge: 2016-10-14 | Disposition: A | Discharge status: Home / Self Care | Payer: Self-pay | Attending: Dermatology | Admitting: Dermatology

## 2016-10-14 DIAGNOSIS — T50905A Adverse effect of unspecified drugs, medicaments and biological substances, initial encounter: Secondary | ICD-10-CM

## 2016-10-14 DIAGNOSIS — T481X5A Adverse effect of skeletal muscle relaxants [neuromuscular blocking agents], initial encounter: Secondary | ICD-10-CM | POA: Insufficient documentation

## 2016-10-14 DIAGNOSIS — Z5321 Procedure and treatment not carried out due to patient leaving prior to being seen by health care provider: Secondary | ICD-10-CM | POA: Insufficient documentation

## 2016-10-14 DIAGNOSIS — M546 Pain in thoracic spine: Secondary | ICD-10-CM | POA: Insufficient documentation

## 2016-10-14 DIAGNOSIS — M62838 Other muscle spasm: Secondary | ICD-10-CM | POA: Insufficient documentation

## 2016-10-14 MED ORDER — DIAZEPAM 5 MG PO TABS: 5.0000 mg | ORAL_TABLET | Freq: Two times a day (BID) | ORAL | 0 refills | Status: DC | PRN

## 2016-10-14 MED ORDER — LIDOCAINE 5 % EX PTCH: 1.0000 | MEDICATED_PATCH | CUTANEOUS | 0 refills | Status: DC

## 2016-10-14 NOTE — ED Notes (Signed)
Pt verbalized understanding discharge instructions and denies any further needs or questions at this time. VS stable, ambulatory and steady gait.   

## 2016-10-14 NOTE — ED Notes (Signed)
Patient presents to ED for assessment of continuing back pain after an injury at work.  Patient is taking Robaxin for muscle spasms and states it makes him dizzy, makes his face hot.  Patient wanting another medication to help with his back pain.

## 2016-10-14 NOTE — ED Triage Notes (Signed)
Pt to ED with c/o upper back pain, feeling nervous all over his body and dry mouth.  Pt seen several times in past for same

## 2016-10-14 NOTE — ED Notes (Signed)
Called for patient with no response 

## 2016-10-14 NOTE — ED Provider Notes (Signed)
[facility] Provider Note   By signing my name below, I, Wesley [name], attest that this documentation has been prepared under the direction and in the presence of [facility] [place]. Electronically Signed: Earmon [name], ED Scribe. 10/14/16. 10:59 PM.    History   Chief Complaint Chief Complaint  Patient presents with  . Back Pain  . Medication Refill    The history is provided by the patient and medical records. No language interpreter was used.    Wesley [name] is a 51 y.o. male with PMHx of chronic back pain and polysubstance abuse and multiple visits for the pain complaints, who presents to the Emergency Department complaining of neck and upper back tightness that has been worsening for the past week. He reports associated HA. Pt has had ongoing neck and upper back pain since falling off a roof approximately 8 months ago. He states that he has been taking Robaxin and it is making him dizzy and gives him the HA. He has been taking the Robaxin for the past week citing the above stated reactions and has been taking Valium for anxiety that has been helping his pain. Moving his neck increases his pain. He denies numbness, tingling or weakness of any extremity, nausea, vomiting, fever, chills, bruising or wounds.   Past Medical History:  Diagnosis Date  . Alcohol abuse   . Anxiety   . Cocaine abuse     There are no active problems to display for this patient.   History reviewed. No pertinent surgical history.     Home Medications    Prior to Admission medications   Medication Sig Start Date End Date Taking? Authorizing Provider  diazepam (VALIUM) 5 MG tablet Take 1 tablet (5 mg total) by mouth every 12 (twelve) hours as needed for anxiety. 10/14/16   Hope Orlene Och, NP  diclofenac sodium (VOLTAREN) 1 % GEL Apply 2 g topically 4 (four) times daily as needed (Muscle pain). 09/20/16   Tiffany Netta Cedars, PA-C  FLUoxetine (PROZAC) 20 MG tablet Take 1 tablet (20 mg total) by  mouth daily. 09/20/16   Vivianne Master, PA-C  hydrOXYzine (ATARAX/VISTARIL) 25 MG tablet Take 1 tablet (25 mg total) by mouth every 6 (six) hours as needed for anxiety. 08/13/16   Chase Picket Ward, PA-C  lidocaine (LIDODERM) 5 % Place 1 patch onto the skin daily. Remove & Discard patch within 12 hours or as directed by MD 10/14/16   Janne Napoleon, NP  LORazepam (ATIVAN) 1 MG tablet Take 1 mg by mouth daily as needed for anxiety.    Historical Provider, MD  methocarbamol (ROBAXIN) 500 MG tablet Take 1 tablet (500 mg total) by mouth 2 (two) times daily. 09/20/16   Tiffany Netta Cedars, PA-C  thiamine (VITAMIN B-1) 100 MG tablet Take 100 mg by mouth daily.    Historical Provider, MD  traZODone (DESYREL) 50 MG tablet Take 1 tablet (50 mg total) by mouth at bedtime. 09/20/16   Vivianne Master, PA-C    Family History Family History  Problem Relation Age of Onset  . Hypertension Mother   . Hypertension Father     Social History Social History  Substance Use Topics  . Smoking status: Never Smoker  . Smokeless tobacco: Never Used  . Alcohol use No     Allergies   Motrin [ibuprofen]   Review of Systems Review of Systems  Constitutional: Negative for chills and fever.  HENT: Negative for dental problem, ear pain, facial swelling, sinus pressure  and sore throat.   Respiratory: Negative for cough.   Cardiovascular: Negative for chest pain.  Gastrointestinal: Negative for abdominal pain, diarrhea, nausea and vomiting.  Genitourinary: Negative for difficulty urinating, dysuria and frequency.  Musculoskeletal: Positive for back pain, myalgias and neck pain. Negative for gait problem and neck stiffness.  Skin: Negative for wound.  Neurological: Positive for headaches. Negative for dizziness, weakness and light-headedness.  Psychiatric/Behavioral: Negative for confusion.     Physical Exam Updated Vital Signs BP 137/87 (BP Location: Left Arm)   Pulse 86   Temp 97.8 F (36.6 C) (Oral)   Resp 18    SpO2 100%   Physical Exam  Constitutional: He is oriented to person, place, and time. He appears well-developed and well-nourished.  HENT:  Head: Normocephalic.  Mouth/Throat: Uvula is midline, oropharynx is clear and moist and mucous membranes are normal.  No oral swelling.  Eyes: EOM are normal. Pupils are equal, round, and reactive to light.  Sclera clear bilaterally.  Neck: Normal range of motion. Neck supple. Muscular tenderness present. No spinous process tenderness present. No neck rigidity.  Tenderness to palpation to bilateral paraspinal muscles of cervical region. Spasms noted.  Cardiovascular: Normal rate.   Pulmonary/Chest: Effort normal.  Abdominal: There is no CVA tenderness.  Musculoskeletal: Normal range of motion.  No thoracic or lumbar tenderness.  Neurological: He is alert and oriented to person, place, and time. He displays normal reflexes. No cranial nerve deficit.  Skin: Skin is warm and dry.  Psychiatric: He has a normal mood and affect. His behavior is normal.  Nursing note and vitals reviewed.    ED Treatments / Results  DIAGNOSTIC STUDIES: Oxygen Saturation is 100% on RA, normal by my interpretation.   COORDINATION OF CARE: 10:43 PM- Will refer to orthopedist. Will prescribe Valium and Lidoderm patches. Pt verbalizes understanding and agrees to plan.  Medications - No data to display  Labs (all labs ordered are listed, but only abnormal results are displayed) Labs Reviewed - No data to display   Radiology No results found.  Procedures Procedures (including critical care time)  Medications Ordered in ED Medications - No data to display   Initial Impression / Assessment and Plan / ED Course  I have reviewed the triage vital signs and the nursing notes.    Pt here for a change in medication due to previously prescribed Robaxin making him dizzy and causing a HA. Will prescribe Valium and Lidoderm patches. Discussed need to follow up with  orthopedist. Pt is safe for discharge at this time.  I personally performed the services described in this documentation, which was scribed in my presence. The recorded information has been reviewed and is accurate.   Final Clinical Impressions(s) / ED Diagnoses   Final diagnoses:  Medication adverse effect, initial encounter  Muscle spasms of neck    New Prescriptions Discharge Medication List as of 10/14/2016 10:56 PM    START taking these medications   Details  diazepam (VALIUM) 5 MG tablet Take 1 tablet (5 mg total) by mouth every 12 (twelve) hours as needed for anxiety., Starting Sat 10/14/2016, Print    lidocaine (LIDODERM) 5 % Place 1 patch onto the skin daily. Remove & Discard patch within 12 hours or as directed by MD, Starting Sat 10/14/2016, Print         GolcondaHope M Neese, NP 10/15/16 0018    Tilden FossaElizabeth Rees, MD 10/15/16 1248

## 2016-10-14 NOTE — Discharge Instructions (Signed)
Stop the muscle relaxant medication you are taking and start what we give you. Call Dr. Nilsa Nuttinglin's office for follow up.

## 2016-10-18 ENCOUNTER — Emergency Department (HOSPITAL_COMMUNITY): Admission: EM | Admit: 2016-10-18 | Discharge: 2016-10-18 | Discharge status: Against Medical Advice | Payer: Self-pay

## 2016-10-18 NOTE — ED Notes (Signed)
Called PT name for triage and no answer.

## 2016-10-18 NOTE — ED Notes (Signed)
Non answer x3 for triage

## 2016-10-18 NOTE — ED Notes (Signed)
No answer in waiting area.

## 2016-10-18 NOTE — ED Triage Notes (Signed)
Called for triage without response in waiting room

## 2016-11-07 ENCOUNTER — Emergency Department (HOSPITAL_COMMUNITY): Payer: Self-pay

## 2016-11-07 ENCOUNTER — Encounter (HOSPITAL_COMMUNITY): Payer: Self-pay

## 2016-11-07 ENCOUNTER — Emergency Department (HOSPITAL_COMMUNITY)
Admission: EM | Admit: 2016-11-07 | Discharge: 2016-11-07 | Disposition: A | Discharge status: Home / Self Care | Payer: Self-pay | Attending: Emergency Medicine | Admitting: Emergency Medicine

## 2016-11-07 DIAGNOSIS — Z5321 Procedure and treatment not carried out due to patient leaving prior to being seen by health care provider: Secondary | ICD-10-CM | POA: Insufficient documentation

## 2016-11-07 DIAGNOSIS — R079 Chest pain, unspecified: Secondary | ICD-10-CM | POA: Insufficient documentation

## 2016-11-07 LAB — BASIC METABOLIC PANEL
Anion gap: 8 (ref 5–15)
BUN: 11 mg/dL (ref 6–20)
CO2: 27 mmol/L (ref 22–32)
Calcium: 9.3 mg/dL (ref 8.9–10.3)
Chloride: 102 mmol/L (ref 101–111)
Creatinine, Ser: 0.81 mg/dL (ref 0.61–1.24)
GFR calc Af Amer: >60 mL/min (ref >60)
GFR calc non Af Amer: >60 mL/min (ref >60)
Glucose, Bld: 114 mg/dL — ABNORMAL HIGH (ref 65–99)
Potassium: 3.7 mmol/L (ref 3.5–5.1)
Sodium: 137 mmol/L (ref 135–145)

## 2016-11-07 LAB — I-STAT TROPONIN, ED: Troponin i, poc: 0.02 ng/mL (ref 0.00–0.08)

## 2016-11-07 LAB — CBC
HCT: 40.6 % (ref 39.0–52.0)
Hemoglobin: 13.2 g/dL (ref 13.0–17.0)
MCH: 27.6 pg (ref 26.0–34.0)
MCHC: 32.5 g/dL (ref 30.0–36.0)
MCV: 84.8 fL (ref 78.0–100.0)
Platelets: 256 10*3/uL (ref 150–400)
RBC: 4.79 MIL/uL (ref 4.22–5.81)
RDW: 13.1 % (ref 11.5–15.5)
WBC: 6.6 10*3/uL (ref 4.0–10.5)

## 2016-11-07 NOTE — ED Notes (Signed)
Patient did not answer when called for vitals update

## 2016-11-07 NOTE — ED Triage Notes (Signed)
RIGHT and LEFT chest pain that started about 3 hours ago. PT states pain is worse with palpation and hurts to take a deep breath causing him to feel SOB. Pt has hx of anxiety and took 5mg  valium 0800.  NAD VSS

## 2016-11-07 NOTE — ED Notes (Signed)
Called for pt x 4 with no response

## 2016-11-07 NOTE — ED Notes (Signed)
Called pt name three times for vitals. No response. 

## 2016-11-16 ENCOUNTER — Emergency Department (HOSPITAL_COMMUNITY)
Admission: EM | Admit: 2016-11-16 | Discharge: 2016-11-17 | Disposition: A | Discharge status: Home / Self Care | Payer: Self-pay | Attending: Emergency Medicine | Admitting: Emergency Medicine

## 2016-11-16 ENCOUNTER — Encounter (HOSPITAL_COMMUNITY): Payer: Self-pay | Admitting: Emergency Medicine

## 2016-11-16 DIAGNOSIS — Y929 Unspecified place or not applicable: Secondary | ICD-10-CM | POA: Insufficient documentation

## 2016-11-16 DIAGNOSIS — T391X5A Adverse effect of 4-Aminophenol derivatives, initial encounter: Secondary | ICD-10-CM | POA: Insufficient documentation

## 2016-11-16 DIAGNOSIS — Y939 Activity, unspecified: Secondary | ICD-10-CM | POA: Insufficient documentation

## 2016-11-16 DIAGNOSIS — T50905A Adverse effect of unspecified drugs, medicaments and biological substances, initial encounter: Secondary | ICD-10-CM

## 2016-11-16 DIAGNOSIS — T887XXA Unspecified adverse effect of drug or medicament, initial encounter: Secondary | ICD-10-CM | POA: Insufficient documentation

## 2016-11-16 DIAGNOSIS — S0502XA Injury of conjunctiva and corneal abrasion without foreign body, left eye, initial encounter: Secondary | ICD-10-CM | POA: Insufficient documentation

## 2016-11-16 DIAGNOSIS — Y649 Contaminated medical or biological substance administered by unspecified means: Secondary | ICD-10-CM | POA: Insufficient documentation

## 2016-11-16 DIAGNOSIS — Y999 Unspecified external cause status: Secondary | ICD-10-CM | POA: Insufficient documentation

## 2016-11-16 DIAGNOSIS — X58XXXA Exposure to other specified factors, initial encounter: Secondary | ICD-10-CM | POA: Insufficient documentation

## 2016-11-16 MED ORDER — FLUORESCEIN SODIUM 0.6 MG OP STRP
1.0000 | ORAL_STRIP | Freq: Once | OPHTHALMIC | Status: AC
  Administered 2016-11-16: 1 via OPHTHALMIC
  Filled 2016-11-16: qty 1

## 2016-11-16 MED ORDER — TETRACAINE HCL 0.5 % OP SOLN
1.0000 [drp] | Freq: Once | OPHTHALMIC | Status: AC
  Administered 2016-11-16: 1 [drp] via OPHTHALMIC
  Filled 2016-11-16: qty 2

## 2016-11-16 NOTE — ED Provider Notes (Signed)
[facility] Provider Note   CSN: [id number] Arrival date & time: 11/16/16  2220  By signing my name below, I, Doreatha Martin, attest that this documentation has been prepared under the direction and in the presence of  [facility] M. Damian Leavell, NP. Electronically Signed: Doreatha Martin, ED Scribe. 11/16/16. 11:29 PM.    History   Chief Complaint Chief Complaint  Patient presents with  . Eye Pain    HPI Wesley [name] is a 51 y.o. male who presents to the Emergency Department complaining of moderate, worsening left eye pain and redness that began yesterday. Pt was seen at [facility] yesterday, dx with corneal abrasion and was prescribed Percocet and erythromycin ointment. He states he has taken the Percocet and previously prescribed Valium and Klonopin with no relief of pain. He also reports no relief of pain with the antibiotic ointment. Pt does complain that the Percocet made him sleepy and shaky, but states he has been taking Klonopin and Valium without difficulty. He denies taking more than one pain medicine at a time. No alleviating factors noted. He denies HA.   The history is provided by the patient. No language interpreter was used.  Eye Pain  This is a new problem. The current episode started yesterday. The problem occurs constantly. The problem has been gradually worsening. Pertinent negatives include no headaches. Nothing aggravates the symptoms. Nothing relieves the symptoms. Treatments tried: antibiotics. The treatment provided no relief.    Past Medical History:  Diagnosis Date  . Alcohol abuse   . Anxiety   . Cocaine abuse     There are no active problems to display for this patient.   History reviewed. No pertinent surgical history.     Home Medications    Prior to Admission medications   Medication Sig Start Date End Date Taking? Authorizing Provider  diazepam (VALIUM) 5 MG tablet Take 1 tablet (5 mg total) by mouth every 12 (twelve) hours as needed for anxiety. 10/14/16    Hope Orlene Och, NP  diclofenac sodium (VOLTAREN) 1 % GEL Apply 2 g topically 4 (four) times daily as needed (Muscle pain). 09/20/16   Tiffany Netta Cedars, PA-C  FLUoxetine (PROZAC) 20 MG tablet Take 1 tablet (20 mg total) by mouth daily. 09/20/16   Vivianne Master, PA-C  hydrOXYzine (ATARAX/VISTARIL) 25 MG tablet Take 1 tablet (25 mg total) by mouth every 6 (six) hours as needed for anxiety. 08/13/16   Chase Picket Ward, PA-C  lidocaine (LIDODERM) 5 % Place 1 patch onto the skin daily. Remove & Discard patch within 12 hours or as directed by MD 10/14/16   Janne Napoleon, NP  LORazepam (ATIVAN) 1 MG tablet Take 1 mg by mouth daily as needed for anxiety.    Historical Provider, MD  methocarbamol (ROBAXIN) 500 MG tablet Take 1 tablet (500 mg total) by mouth 2 (two) times daily. 09/20/16   Tiffany Netta Cedars, PA-C  thiamine (VITAMIN B-1) 100 MG tablet Take 100 mg by mouth daily.    Historical Provider, MD  traZODone (DESYREL) 50 MG tablet Take 1 tablet (50 mg total) by mouth at bedtime. 09/20/16   Vivianne Master, PA-C    Family History Family History  Problem Relation Age of Onset  . Hypertension Mother   . Hypertension Father     Social History Social History  Substance Use Topics  . Smoking status: Never Smoker  . Smokeless tobacco: Never Used  . Alcohol use No     Allergies   Motrin [ibuprofen]  Review of Systems Review of Systems  Eyes: Positive for pain and redness.  Neurological: Negative for headaches.     Physical Exam Updated Vital Signs BP 150/89 (BP Location: Left Arm)   Pulse 91   Temp 98.1 F (36.7 C) (Oral)   Resp 19   Ht 5\' 6"  (1.676 m)   Wt 88.5 kg   SpO2 97%   BMI 31.47 kg/m   Physical Exam  Constitutional: He appears well-developed and well-nourished. No distress.  HENT:  Head: Normocephalic and atraumatic.  Mouth/Throat: Mucous membranes are normal.  Eyes: EOM are normal. Lids are everted and swept, no foreign bodies found. Left eye exhibits discharge. Left eye  exhibits no hordeolum. No foreign body present in the left eye. Left conjunctiva is injected.  Fundoscopic exam:      The left eye shows no hemorrhage.  Slit lamp exam:      The left eye shows corneal abrasion and fluorescein uptake. The left eye shows no foreign body.    Large corneal abrasion to the left eye at 2 o'clock. Pressure 19 in right eye, 21 left eye.  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   Neck: Normal range of motion.  Cardiovascular: Normal rate.   Pulmonary/Chest: Effort normal. No respiratory distress.  Musculoskeletal: Normal range of motion.  Neurological: He is alert.  Skin: Skin is warm and dry.  Psychiatric: He has a normal mood and affect. His behavior is normal.  Nursing note and vitals reviewed.    ED Treatments / Results   DIAGNOSTIC STUDIES: Oxygen Saturation is 97% on RA, normal by my interpretation.    COORDINATION OF CARE: 11:26 PM Discussed treatment plan with pt at bedside which includes f/u with ophthalmology and pt agreed to plan.    Labs (all labs ordered are listed, but only abnormal results are displayed) Labs Reviewed - No data to display Radiology No results found.  Procedures Procedures (including critical care time)  Medications Ordered in ED Medications  tetracaine (PONTOCAINE) 0.5 % ophthalmic solution 1 drop (1 drop Left Eye Given by Other 11/16/16 2344)  fluorescein ophthalmic strip 1 strip (1 strip Left Eye Given 11/16/16 2344)     Initial Impression / Assessment and Plan / ED Course  I have reviewed the triage vital signs and the nursing notes.  Pt with corneal abrasion on exam. No evidence of FB.  Exam not concerning for orbital cellulitis, hyphema. No concern for uveitis. Patient will be discharged home with instructions to continue using erythromycin, stop the oxycodone that made him feel jittery and f/u with ophthalmology. Patient understands to follow up with ophthalmology and will call in the  morning for appointment time; return precautions discussed. Patient appears safe for discharged.    Final Clinical Impressions(s) / ED Diagnoses   Final diagnoses:  Abrasion of left cornea, initial encounter  Adverse effect of drug, initial encounter    New Prescriptions New Prescriptions   No medications on file   I personally performed the services described in this documentation, which was scribed in my presence. The recorded information has been reviewed and is accurate.    [address]Hope KoloaM Neese, [place]NP 11/17/16 [id number]    Canary Brimhristopher J Tegeler, MD 11/17/16 1120

## 2016-11-16 NOTE — ED Triage Notes (Signed)
Pt to ED c/o worsening L eye pain and swelling since yesterday - on erythromycin ointment and pain medicine (Percocet), states he took his pain medication today and his body started shaking. Worried about a medication interaction as he takes clonazepam as well. L eye redness and erythema noted. Pt denies fevers/chills/drainage.

## 2016-11-17 NOTE — Discharge Instructions (Signed)
Stop the pain medication that caused you to feel jittery.  Call the eye doctor in the morning and tell them you were seen here and need to see them tomorrow.  Continue the eye ointment.

## 2016-11-17 NOTE — ED Notes (Signed)
Patient Alert and oriented X4. Stable and ambulatory. Patient verbalized understanding of the discharge instructions.  Patient belongings were taken by the patient.  

## 2016-11-17 NOTE — ED Notes (Signed)
Patient states he was given a tetanus shot the last visit he had.  NP aware and states he can be discharged.

## 2016-11-18 ENCOUNTER — Emergency Department (HOSPITAL_COMMUNITY)
Admission: EM | Admit: 2016-11-18 | Discharge: 2016-11-18 | Disposition: A | Discharge status: Home / Self Care | Payer: Self-pay | Attending: Emergency Medicine | Admitting: Emergency Medicine

## 2016-11-18 ENCOUNTER — Encounter (HOSPITAL_COMMUNITY): Payer: Self-pay | Admitting: Emergency Medicine

## 2016-11-18 DIAGNOSIS — F419 Anxiety disorder, unspecified: Secondary | ICD-10-CM | POA: Insufficient documentation

## 2016-11-18 DIAGNOSIS — R110 Nausea: Secondary | ICD-10-CM | POA: Insufficient documentation

## 2016-11-18 LAB — CBC
HCT: 46 % (ref 39.0–52.0)
Hemoglobin: 15.2 g/dL (ref 13.0–17.0)
MCH: 27.8 pg (ref 26.0–34.0)
MCHC: 33 g/dL (ref 30.0–36.0)
MCV: 84.2 fL (ref 78.0–100.0)
Platelets: 276 10*3/uL (ref 150–400)
RBC: 5.46 MIL/uL (ref 4.22–5.81)
RDW: 13.3 % (ref 11.5–15.5)
WBC: 6.2 10*3/uL (ref 4.0–10.5)

## 2016-11-18 LAB — COMPREHENSIVE METABOLIC PANEL
ALT: 29 U/L (ref 17–63)
AST: 32 U/L (ref 15–41)
Albumin: 4.7 g/dL (ref 3.5–5.0)
Alkaline Phosphatase: 78 U/L (ref 38–126)
Anion gap: 12 (ref 5–15)
BUN: 10 mg/dL (ref 6–20)
CO2: 26 mmol/L (ref 22–32)
Calcium: 10.1 mg/dL (ref 8.9–10.3)
Chloride: 99 mmol/L — ABNORMAL LOW (ref 101–111)
Creatinine, Ser: 0.82 mg/dL (ref 0.61–1.24)
GFR calc Af Amer: >60 mL/min (ref >60)
GFR calc non Af Amer: >60 mL/min (ref >60)
Glucose, Bld: 120 mg/dL — ABNORMAL HIGH (ref 65–99)
Potassium: 4 mmol/L (ref 3.5–5.1)
Sodium: 137 mmol/L (ref 135–145)
Total Bilirubin: 0.9 mg/dL (ref 0.3–1.2)
Total Protein: 8.6 g/dL — ABNORMAL HIGH (ref 6.5–8.1)

## 2016-11-18 LAB — I-STAT TROPONIN, ED: Troponin i, poc: 0 ng/mL (ref 0.00–0.08)

## 2016-11-18 LAB — LIPASE, BLOOD: Lipase: 16 U/L (ref 11–51)

## 2016-11-18 MED ORDER — HYDROXYZINE HCL 25 MG PO TABS: 25.0000 mg | ORAL_TABLET | Freq: Four times a day (QID) | ORAL | 0 refills | Status: DC | PRN

## 2016-11-18 MED ORDER — ONDANSETRON 4 MG PO TBDP
4.0000 mg | ORAL_TABLET | Freq: Once | ORAL | Status: AC
  Administered 2016-11-18: 4 mg via ORAL
  Filled 2016-11-18: qty 1

## 2016-11-18 NOTE — ED Notes (Signed)
Declined W/C at D/C and was escorted to lobby by RN. 

## 2016-11-18 NOTE — ED Triage Notes (Signed)
PT. STATED, STOMACH PAIN WITH SICK LIKE COME UP (NAUSEA)

## 2016-11-18 NOTE — ED Provider Notes (Signed)
[facility] Provider Note   CSN: [id number] Arrival date & time: 11/18/16  [id number]     History   Chief Complaint Chief Complaint  Patient presents with  . Abdominal Pain  . Nausea    HPI Wesley LitterJose Jeon is a 51 y.o. male.  The history is provided by the patient. No language interpreter was used.  Abdominal Pain      Wesley [name] is a 51 y.o. male who presents to the Emergency Department complaining of nausea, abdominal pain.  He reports epigastric abdominal discomfort with associated nausea that began this morning. He states that he took a Klonopin to see if that would alleviate his symptoms but has no significant relief. He has been prescribed Valium in the past for his anxiety and feels that that works better for him. He has associated dizziness and lightheadedness breath. No fevers, chest pain, vomiting. He reports severe anxiety that has been present since falling off a roof last year.  Past Medical History:  Diagnosis Date  . Alcohol abuse   . Anxiety   . Cocaine abuse     There are no active problems to display for this patient.   History reviewed. No pertinent surgical history.     Home Medications    Prior to Admission medications   Medication Sig Start Date End Date Taking? Authorizing Provider  clonazePAM (KLONOPIN) 0.5 MG tablet Take 0.5 mg by mouth 2 (two) times daily as needed for anxiety.   Yes Historical Provider, MD  thiamine (VITAMIN B-1) 100 MG tablet Take 100 mg by mouth daily.   Yes Historical Provider, MD  diazepam (VALIUM) 5 MG tablet Take 1 tablet (5 mg total) by mouth every 12 (twelve) hours as needed for anxiety. Patient not taking: Reported on 11/18/2016 10/14/16   Janne NapoleonHope M Neese, NP  diclofenac sodium (VOLTAREN) 1 % GEL Apply 2 g topically 4 (four) times daily as needed (Muscle pain). Patient not taking: Reported on 11/18/2016 09/20/16   Vivianne Masteriffany S Noel, PA-C  FLUoxetine (PROZAC) 20 MG tablet Take 1 tablet (20 mg total) by mouth daily. Patient  not taking: Reported on 11/18/2016 09/20/16   Vivianne Masteriffany S Noel, PA-C  hydrOXYzine (ATARAX/VISTARIL) 25 MG tablet Take 1 tablet (25 mg total) by mouth every 6 (six) hours as needed for anxiety or nausea. 11/18/16   Tilden FossaElizabeth Amberleigh Gerken, MD  lidocaine (LIDODERM) 5 % Place 1 patch onto the skin daily. Remove & Discard patch within 12 hours or as directed by MD Patient not taking: Reported on 11/18/2016 10/14/16   Janne NapoleonHope M Neese, NP  LORazepam (ATIVAN) 1 MG tablet Take 1 mg by mouth daily as needed for anxiety.    Historical Provider, MD  methocarbamol (ROBAXIN) 500 MG tablet Take 1 tablet (500 mg total) by mouth 2 (two) times daily. Patient not taking: Reported on 11/18/2016 09/20/16   Vivianne Masteriffany S Noel, PA-C  traZODone (DESYREL) 50 MG tablet Take 1 tablet (50 mg total) by mouth at bedtime. Patient not taking: Reported on 11/18/2016 09/20/16   Vivianne Masteriffany S Noel, PA-C    Family History Family History  Problem Relation Age of Onset  . Hypertension Mother   . Hypertension Father     Social History Social History  Substance Use Topics  . Smoking status: Never Smoker  . Smokeless tobacco: Never Used  . Alcohol use No     Allergies   Motrin [ibuprofen]   Review of Systems Review of Systems  Gastrointestinal: Positive for abdominal pain.  All other systems reviewed  and are negative.    Physical Exam Updated Vital Signs BP 129/90 (BP Location: Left Arm)   Pulse 97   Temp 98.3 F (36.8 C) (Oral)   Resp 20   Ht 5\' 6"  (1.676 m)   Wt 190 lb (86.2 kg)   SpO2 93%   BMI 30.67 kg/m   Physical Exam  Constitutional: He is oriented to person, place, and time. He appears well-developed and well-nourished.  HENT:  Head: Normocephalic and atraumatic.  Cardiovascular: Regular rhythm.   No murmur heard. Tachycardic  Pulmonary/Chest: Effort normal and breath sounds normal. No respiratory distress.  Abdominal: Soft. There is no tenderness. There is no rebound and no guarding.  Musculoskeletal: He exhibits no  edema or tenderness.  Neurological: He is alert and oriented to person, place, and time.  Skin: Skin is warm and dry.  Psychiatric: His behavior is normal.  Anxious  Nursing note and vitals reviewed.    ED Treatments / Results  Labs (all labs ordered are listed, but only abnormal results are displayed) Labs Reviewed  COMPREHENSIVE METABOLIC PANEL - Abnormal; Notable for the following:       Result Value   Chloride 99 (*)    Glucose, Bld 120 (*)    Total Protein 8.6 (*)    All other components within normal limits  LIPASE, BLOOD  CBC  I-STAT TROPOININ, ED    EKG  EKG Interpretation  Date/Time:  Saturday November 18 2016 09:55:35 EST Ventricular Rate:  95 PR Interval:    QRS Duration: 100 QT Interval:  353 QTC Calculation: 444 R Axis:   -89 Text Interpretation:  Sinus rhythm Left anterior fascicular block Abnormal R-wave progression, late transition ST elev, probable normal early repol pattern No significant change since last tracing Confirmed by Lincoln Brigham [phone]) on 11/18/2016 9:57:49 AM       Radiology No results found.  Procedures Procedures (including critical care time)  Medications Ordered in ED Medications  ondansetron (ZOFRAN-ODT) disintegrating tablet 4 mg (not administered)     Initial Impression / Assessment and Plan / ED Course  I have reviewed the triage vital signs and the nursing notes.  Pertinent labs & imaging results that were available during my care of the patient were reviewed by me and considered in my medical decision making (see chart for details).     Patient here for evaluation of nausea and epigastric discomfort, he attributes this to his anxiety. He has been previously treated with Valium but was recently transitioned to Klonopin and states he had better relief of his symptoms with Valium. He has no shortness of breath or chest pain. Current clinical picture is not consistent with PE, ACS, dissection, pneumonia, bowel obstruction,  pancreatitis. He is not suicidal or homicidal on examination. He did have mild improvement following Zofran. Counseled patient on home care for anxiety. Discussed psychiatry follow-up. Providing prescription for Atarax.  Final Clinical Impressions(s) / ED Diagnoses   Final diagnoses:  Nausea  Anxiety    New Prescriptions New Prescriptions   HYDROXYZINE (ATARAX/VISTARIL) 25 MG TABLET    Take 1 tablet (25 mg total) by mouth every 6 (six) hours as needed for anxiety or nausea.     Tilden Fossa, MD 11/18/16 1037

## 2016-11-21 ENCOUNTER — Encounter (HOSPITAL_COMMUNITY): Payer: Self-pay | Admitting: Emergency Medicine

## 2016-11-21 ENCOUNTER — Observation Stay (HOSPITAL_COMMUNITY): Payer: Self-pay

## 2016-11-21 ENCOUNTER — Observation Stay (HOSPITAL_COMMUNITY)
Admission: EM | Admit: 2016-11-21 | Discharge: 2016-11-22 | Disposition: A | Discharge status: Home / Self Care | Payer: Self-pay | Attending: Student in an Organized Health Care Education/Training Program | Admitting: Student in an Organized Health Care Education/Training Program

## 2016-11-21 ENCOUNTER — Observation Stay (HOSPITAL_BASED_OUTPATIENT_CLINIC_OR_DEPARTMENT_OTHER): Payer: Self-pay

## 2016-11-21 DIAGNOSIS — M549 Dorsalgia, unspecified: Secondary | ICD-10-CM | POA: Insufficient documentation

## 2016-11-21 DIAGNOSIS — F1010 Alcohol abuse, uncomplicated: Secondary | ICD-10-CM | POA: Insufficient documentation

## 2016-11-21 DIAGNOSIS — F1410 Cocaine abuse, uncomplicated: Secondary | ICD-10-CM | POA: Insufficient documentation

## 2016-11-21 DIAGNOSIS — M542 Cervicalgia: Secondary | ICD-10-CM | POA: Insufficient documentation

## 2016-11-21 DIAGNOSIS — R9431 Abnormal electrocardiogram [ECG] [EKG]: Principal | ICD-10-CM

## 2016-11-21 DIAGNOSIS — E669 Obesity, unspecified: Secondary | ICD-10-CM

## 2016-11-21 DIAGNOSIS — R1013 Epigastric pain: Secondary | ICD-10-CM | POA: Insufficient documentation

## 2016-11-21 DIAGNOSIS — F1021 Alcohol dependence, in remission: Secondary | ICD-10-CM

## 2016-11-21 DIAGNOSIS — Z886 Allergy status to analgesic agent status: Secondary | ICD-10-CM | POA: Insufficient documentation

## 2016-11-21 DIAGNOSIS — R110 Nausea: Secondary | ICD-10-CM

## 2016-11-21 DIAGNOSIS — Z79899 Other long term (current) drug therapy: Secondary | ICD-10-CM

## 2016-11-21 DIAGNOSIS — K219 Gastro-esophageal reflux disease without esophagitis: Secondary | ICD-10-CM | POA: Insufficient documentation

## 2016-11-21 DIAGNOSIS — F419 Anxiety disorder, unspecified: Secondary | ICD-10-CM

## 2016-11-21 DIAGNOSIS — F1411 Cocaine abuse, in remission: Secondary | ICD-10-CM

## 2016-11-21 DIAGNOSIS — R079 Chest pain, unspecified: Secondary | ICD-10-CM | POA: Insufficient documentation

## 2016-11-21 DIAGNOSIS — G8929 Other chronic pain: Secondary | ICD-10-CM | POA: Insufficient documentation

## 2016-11-21 HISTORY — DX: Other chronic pain: G89.29

## 2016-11-21 HISTORY — DX: Cervicalgia: M54.2

## 2016-11-21 LAB — COMPREHENSIVE METABOLIC PANEL
ALT: 26 U/L (ref 17–63)
AST: 26 U/L (ref 15–41)
Albumin: 4.2 g/dL (ref 3.5–5.0)
Alkaline Phosphatase: 68 U/L (ref 38–126)
Anion gap: 11 (ref 5–15)
BUN: 10 mg/dL (ref 6–20)
CO2: 26 mmol/L (ref 22–32)
Calcium: 9.4 mg/dL (ref 8.9–10.3)
Chloride: 102 mmol/L (ref 101–111)
Creatinine, Ser: 0.78 mg/dL (ref 0.61–1.24)
GFR calc Af Amer: >60 mL/min (ref >60)
GFR calc non Af Amer: >60 mL/min (ref >60)
Glucose, Bld: 98 mg/dL (ref 65–99)
Potassium: 3.8 mmol/L (ref 3.5–5.1)
Sodium: 139 mmol/L (ref 135–145)
Total Bilirubin: 0.6 mg/dL (ref 0.3–1.2)
Total Protein: 7.5 g/dL (ref 6.5–8.1)

## 2016-11-21 LAB — RAPID URINE DRUG SCREEN, HOSP PERFORMED
Amphetamines: NOT DETECTED
Barbiturates: NOT DETECTED
Benzodiazepines: POSITIVE — AB
Cocaine: NOT DETECTED
Opiates: NOT DETECTED
Tetrahydrocannabinol: NOT DETECTED

## 2016-11-21 LAB — D-DIMER, QUANTITATIVE: D-Dimer, Quant: 0.47 ug/mL-FEU (ref 0.00–0.50)

## 2016-11-21 LAB — CBC
HCT: 42.2 % (ref 39.0–52.0)
Hemoglobin: 14 g/dL (ref 13.0–17.0)
MCH: 27.9 pg (ref 26.0–34.0)
MCHC: 33.2 g/dL (ref 30.0–36.0)
MCV: 84.2 fL (ref 78.0–100.0)
Platelets: 239 10*3/uL (ref 150–400)
RBC: 5.01 MIL/uL (ref 4.22–5.81)
RDW: 13.1 % (ref 11.5–15.5)
WBC: 5.4 10*3/uL (ref 4.0–10.5)

## 2016-11-21 LAB — LIPID PANEL
Cholesterol: 238 mg/dL — ABNORMAL HIGH (ref 0–200)
HDL: 41 mg/dL (ref >40)
LDL Cholesterol: 142 mg/dL — ABNORMAL HIGH (ref 0–99)
Total CHOL/HDL Ratio: 5.8 RATIO
Triglycerides: 277 mg/dL — ABNORMAL HIGH (ref <150)
VLDL: 55 mg/dL — ABNORMAL HIGH (ref 0–40)

## 2016-11-21 LAB — TROPONIN I
Troponin I: <0.03 ng/mL (ref <0.03)
Troponin I: <0.03 ng/mL (ref <0.03)
Troponin I: <0.03 ng/mL (ref <0.03)

## 2016-11-21 LAB — ECHOCARDIOGRAM COMPLETE
Height: 66 in
Weight: 3072 oz

## 2016-11-21 LAB — LIPASE, BLOOD: Lipase: 17 U/L (ref 11–51)

## 2016-11-21 LAB — BRAIN NATRIURETIC PEPTIDE: B Natriuretic Peptide: 16.9 pg/mL (ref 0.0–100.0)

## 2016-11-21 MED ORDER — ONDANSETRON HCL 4 MG PO TABS: 4.0000 mg | ORAL_TABLET | Freq: Four times a day (QID) | ORAL | Status: DC | PRN

## 2016-11-21 MED ORDER — ONDANSETRON HCL 4 MG/2ML IJ SOLN
4.0000 mg | Freq: Four times a day (QID) | INTRAMUSCULAR | Status: DC | PRN
  Administered 2016-11-21 – 2016-11-22 (×2): 4 mg via INTRAVENOUS
  Filled 2016-11-21 (×2): qty 2

## 2016-11-21 MED ORDER — GI COCKTAIL ~~LOC~~
30.0000 mL | Freq: Once | ORAL | Status: AC
  Administered 2016-11-21: 30 mL via ORAL
  Filled 2016-11-21: qty 30

## 2016-11-21 MED ORDER — ENOXAPARIN SODIUM 40 MG/0.4ML ~~LOC~~ SOLN
40.0000 mg | SUBCUTANEOUS | Status: DC
  Filled 2016-11-21: qty 0.4

## 2016-11-21 MED ORDER — SERTRALINE HCL 50 MG PO TABS
25.0000 mg | ORAL_TABLET | Freq: Every day | ORAL | Status: DC
  Administered 2016-11-22: 25 mg via ORAL
  Filled 2016-11-21: qty 1

## 2016-11-21 MED ORDER — ONDANSETRON HCL 4 MG/2ML IJ SOLN: 4.0000 mg | Freq: Once | INTRAMUSCULAR | Status: DC

## 2016-11-21 MED ORDER — CLONAZEPAM 0.5 MG PO TABS: 0.5000 mg | ORAL_TABLET | Freq: Two times a day (BID) | ORAL | Status: DC | PRN

## 2016-11-21 MED ORDER — LORAZEPAM 0.5 MG PO TABS
0.5000 mg | ORAL_TABLET | Freq: Once | ORAL | Status: AC
  Administered 2016-11-21: 0.5 mg via ORAL
  Filled 2016-11-21: qty 1

## 2016-11-21 MED ORDER — ACETAMINOPHEN 325 MG PO TABS
650.0000 mg | ORAL_TABLET | Freq: Four times a day (QID) | ORAL | Status: DC | PRN
  Administered 2016-11-21: 650 mg via ORAL
  Filled 2016-11-21: qty 2

## 2016-11-21 MED ORDER — SODIUM CHLORIDE 0.9% FLUSH
3.0000 mL | Freq: Two times a day (BID) | INTRAVENOUS | Status: DC
  Administered 2016-11-21: 3 mL via INTRAVENOUS
  Administered 2016-11-22: 10 mL via INTRAVENOUS

## 2016-11-21 NOTE — Progress Notes (Signed)
  Echocardiogram 2D Echocardiogram has been performed.  Leta JunglingCooper, Tiffany M 11/21/2016, 3:20 PM

## 2016-11-21 NOTE — ED Triage Notes (Signed)
Per Pt, Pt is coming from home with complaints of epigastric stomach pain that started this morning around 0500. Repors Nausea and diarrhea, but no vomiting.

## 2016-11-21 NOTE — ED Notes (Signed)
Pt transported to xray 

## 2016-11-21 NOTE — ED Notes (Signed)
ECHO was at bedside

## 2016-11-21 NOTE — ED Provider Notes (Signed)
[facility]-EMER[facility]ENCY DEPT Provider Note   CSN: [id number] Arrival date & time: 11/21/16  0808     History   Chief Complaint Chief Complaint  Patient presents with  . Abdominal Pain    HPI Wesley [name] is a 51 y.o. male.  HPI PMH anxiety presents with epigastric abdominal pain which has been ongoing for months. The pain is worse in the morning and throughout the day when he experiences anxiety. He is currently taking sertraline for anxiety. The pain is associated with nausea but he has not had vomiting. The pain is not associated with eating, metallic taste. He used to drink alcohol heavily but quit 06/2016 and has no know history of gallstones. He denies diarrhea, constipation, blood per rectum or melena. He denies chest pain or shortness of breath. He has chronic back and shoulder pain which he feels is currently actually better than it usually feels. [facility] for the same set of symptoms. Had unremarkable CMP, CBC, lipase, and troponin at that time. He was prescribed atarax but has not had the chance to pick that up yet.   Past Medical History:  Diagnosis Date  . Alcohol abuse   . Anxiety   . Cocaine abuse     There are no active problems to display for this patient.   History reviewed. No pertinent surgical history.     Home Medications    Prior to Admission medications   Medication Sig Start Date End Date Taking? Authorizing Provider  clonazePAM (KLONOPIN) 0.5 MG tablet Take 0.5 mg by mouth 2 (two) times daily as needed for anxiety.    Historical Provider, MD  diazepam (VALIUM) 5 MG tablet Take 1 tablet (5 mg total) by mouth every 12 (twelve) hours as needed for anxiety. Patient not taking: Reported on 11/18/2016 10/14/16   Janne NapoleonHope M Neese, NP  diclofenac sodium (VOLTAREN) 1 % GEL Apply 2 g topically 4 (four) times daily as needed (Muscle pain). Patient not taking: Reported on 11/18/2016 09/20/16   Vivianne Masteriffany S Noel, PA-C  FLUoxetine (PROZAC) 20 MG tablet Take  1 tablet (20 mg total) by mouth daily. Patient not taking: Reported on 11/18/2016 09/20/16   Vivianne Masteriffany S Noel, PA-C  hydrOXYzine (ATARAX/VISTARIL) 25 MG tablet Take 1 tablet (25 mg total) by mouth every 6 (six) hours as needed for anxiety or nausea. 11/18/16   Tilden FossaElizabeth Rees, MD  lidocaine (LIDODERM) 5 % Place 1 patch onto the skin daily. Remove & Discard patch within 12 hours or as directed by MD Patient not taking: Reported on 11/18/2016 10/14/16   Janne NapoleonHope M Neese, NP  LORazepam (ATIVAN) 1 MG tablet Take 1 mg by mouth daily as needed for anxiety.    Historical Provider, MD  methocarbamol (ROBAXIN) 500 MG tablet Take 1 tablet (500 mg total) by mouth 2 (two) times daily. Patient not taking: Reported on 11/18/2016 09/20/16   Vivianne Masteriffany S Noel, PA-C  thiamine (VITAMIN B-1) 100 MG tablet Take 100 mg by mouth daily.    Historical Provider, MD  traZODone (DESYREL) 50 MG tablet Take 1 tablet (50 mg total) by mouth at bedtime. Patient not taking: Reported on 11/18/2016 09/20/16   Vivianne Masteriffany S Noel, PA-C    Family History Family History  Problem Relation Age of Onset  . Hypertension Mother   . Hypertension Father     Social History Social History  Substance Use Topics  . Smoking status: Never Smoker  . Smokeless tobacco: Never Used  . Alcohol use No  Allergies   Motrin [ibuprofen]   Review of Systems Review of Systems  Respiratory: Negative for shortness of breath.   Cardiovascular: Negative for chest pain and leg swelling.  Gastrointestinal: Positive for abdominal pain and nausea. Negative for abdominal distention, blood in stool, constipation, diarrhea and vomiting.  Genitourinary: Negative for dysuria.  Musculoskeletal: Positive for back pain.  All other systems reviewed and are negative.    Physical Exam Updated Vital Signs BP 138/82 (BP Location: Right Arm)   Pulse 87   Temp 98.1 F (36.7 C) (Oral)   Resp 18   Ht 5\' 6"  (1.676 m)   Wt 87.1 kg   SpO2 98%   BMI 30.99 kg/m   Physical  Exam  Constitutional: He is oriented to person, place, and time. He appears well-developed and well-nourished. No distress.  HENT:  Head: Normocephalic and atraumatic.  Eyes: Conjunctivae are normal. No scleral icterus.  Cardiovascular: Normal rate and regular rhythm.   No murmur heard. Pulmonary/Chest: Effort normal. No respiratory distress. He has no wheezes. He has no rales.  Abdominal: Soft. Bowel sounds are normal. He exhibits no distension. There is tenderness. There is no guarding.  Mild tenderness to epigastric palpation  Neurological: He is alert and oriented to person, place, and time.  Skin: Skin is warm and dry. He is not diaphoretic.  Psychiatric: He has a normal mood and affect. His behavior is normal.     ED Treatments / Results  Labs (all labs ordered are listed, but only abnormal results are displayed) Labs Reviewed  CBC  LIPASE, BLOOD  COMPREHENSIVE METABOLIC PANEL  URINALYSIS, ROUTINE W REFLEX MICROSCOPIC    EKG  EKG Interpretation None       Radiology No results found.  Procedures Procedures (including critical care time)  Medications Ordered in ED Medications - No data to display   Initial Impression / Assessment and Plan / ED Course  I have reviewed the triage vital signs and the nursing notes.  Pertinent labs & imaging results that were available during my care of the patient were reviewed by me and considered in my medical decision making (see chart for details).   51 year old man with PMH anxiety presents with epigastric abdominal pain associated with anxiety. On exam he has stable vitals and mild epigastric tenderness.  Pain is not related to po intake and he is afebrile without leukocytosis, I do not think he has cholecystitis. Initial troponin is negative but EKG showed new T wave inversions and flattening and q wave in lead III concerning for ACS. Received GI cocktail. Will admit for chest pain rule out.    Final Clinical Impressions(s)  / ED Diagnoses   Final diagnoses:  None    New Prescriptions New Prescriptions   No medications on file     Eulah Pont, MD 11/21/16 1029    Melene Plan, DO 11/21/16 1039

## 2016-11-21 NOTE — H&P (Signed)
Date: 11/21/2016               Patient Name:  Wesley [name] MRN: [id number]  DOB: [birth year] Age / Sex: 51 y.o., male   PCP: Pcp Not In System         Medical Service: Internal Medicine Teaching Service         Attending Physician: Dr. Tyson Alias, MD    First Contact: Dr. Peggyann Juba Pager: [phone]  Second Contact: Dr. Loney Loh Pager: [phone]       After Hours (After 5p/  First Contact Pager: [phone]  weekends / holidays): Second Contact Pager: [phone]   Chief Complaint: Nausea    History of Present Illness: [name] is a 51yo man with PMHx of anxiety, cocaine abuse, and alcohol abuse who presents today with nausea. He reports the nausea has been ongoing for the last year and occurs about once per week. He states he was told by a previous provider that his nausea was due to anxiety. He just started sertraline about 1 week ago. Of note, he has been to the ED 31 times in the last 6 months for various complaints of chest pain, neck pain, back pain, abdominal pain, and nausea that have been attributed to anxiety. He states the nausea will occur both when he is active (walking, cleaning) and at times of rest. He gets the nausea sensation in his epigastric region. He denies any associated fevers, chills, chest pain, SOB, abdominal pain, vomiting, headaches, vision changes, diaphoresis, diarrhea, or constipation. He reports he used to drink heavily, an 18 pack of beer daily, but quit in October 2017 after he had a work accident where he fell off of a roof. He reports prior cocaine use with last use in October 2017.   In the ED, his EKG was noted to have new inferolateral T wave inversions when compared to his EKG 3 days ago. Initial troponin negative.   Meds:  Sertraline 25 mg daily   Allergies: Allergies as of 11/21/2016 - Review Complete 11/21/2016  Allergen Reaction Noted  . Motrin [ibuprofen] Other (See Comments) 08/20/2016   Past Medical History:  Diagnosis Date  .  Alcohol abuse   . Anxiety   . Cocaine abuse     Family History: Mother- HTN. Father- HTN.   Social History: Lives with wife at home. Used to work in [employer], but now not working due to work accident in October 2017 where he fell off a roof. Denies any tobacco use. Denies current alcohol use but used to drink an 18 pack of beer daily for 22 years. Reports prior cocaine use, last use in October 2017.   Review of Systems: A complete ROS was negative except as per HPI.   Physical Exam: Blood pressure 124/93, pulse 75, temperature 98.1 F (36.7 C), temperature source Oral, resp. rate 21, height 5\' 6"  (1.676 m), weight 192 lb (87.1 kg), SpO2 99 %. General: Obese middle aged man sitting up in bed, NAD HEENT: [place]/AT, EOMI, sclera anicteric, mucus membranes moist CV: RRR, no m/g/r Pulm: CTA bilaterally, breaths non-labored Abd: BS+, soft, obese, non-tender Ext: warm, no peripheral edema, moves all Neuro: alert and oriented x 3  EKG: Sinus rhythm. New T wave inversions in II, aVF, V3-V6, and more pronounced T wave inversion in III.   CXR: No acute abnormalities.   Assessment & Plan by Problem:  Nausea: Patient presenting with a 1 year hx of intermittent nausea incidentally found to have ischemic EKG  changes. He denies any chest or abdominal pain. It's unclear if his nausea is representing an anginal equivalent. Other possibilities for his nausea include sertraline (can cause GI side effects early on), anxiety, migraine, biliary colic, and alcohol. His lipase was negative so pancreatitis unlikely. All of his other labs, including CBC, LFTs, and bmet are normal so an infectious or biliary cause seem unlikely. He is not having any additional symptoms such as vomiting to suggest gastroparesis or SBO. Will treat his nausea and work up his EKG changes. Will also get abdominal US to evaluate for any acute abnormalities. - Zofran PRN - Abdominal US  New Inferolateral T Wave Inversions: Noted on  EKG today and new from his EKG just 3 days ago when he was in the ED for abdominal pain/nausea. He denies any chest pain, abdominal pain, SOB, or diaphoresis. Initial troponin negative. Will trend troponins, monitor on telemetry, and evaluate for other CAD risk factors. Will also obtain echo to evaluate for possible cardiomyopathies.  - Trend trops - Cardiac monitoring - Obtain echo - Check UDS - Check lipids, HbA1c  Hx Anxiety: Reports being told this by multiple providers. Does not have a PCP and has multiple ED visits for various complaints. He was recently started on Sertraline and reports this is helping. - Continue home Sertraline 25 mg daily - Can use Klonopin PRN if having acute anxiety as therapeutic effects of Sertraline will not be obtained for a few weeks   Diet: Heart healthy  DVT Ppx: Lovenox SQ Dispo: Admit patient to Observation with expected length of stay less than 2 midnights.  Signed: Su Hoffarly J Rivet, MD 11/21/2016, 10:35 AM  Pager: [phone]

## 2016-11-22 ENCOUNTER — Telehealth: Payer: Self-pay | Admitting: Internal Medicine

## 2016-11-22 ENCOUNTER — Emergency Department (HOSPITAL_COMMUNITY)
Admission: EM | Admit: 2016-11-22 | Discharge: 2016-11-22 | Disposition: A | Discharge status: Home / Self Care | Payer: Self-pay | Attending: Emergency Medicine | Admitting: Emergency Medicine

## 2016-11-22 ENCOUNTER — Encounter (HOSPITAL_COMMUNITY): Payer: Self-pay | Admitting: Emergency Medicine

## 2016-11-22 DIAGNOSIS — R61 Generalized hyperhidrosis: Secondary | ICD-10-CM | POA: Insufficient documentation

## 2016-11-22 DIAGNOSIS — R55 Syncope and collapse: Secondary | ICD-10-CM | POA: Insufficient documentation

## 2016-11-22 DIAGNOSIS — R002 Palpitations: Secondary | ICD-10-CM | POA: Insufficient documentation

## 2016-11-22 DIAGNOSIS — F419 Anxiety disorder, unspecified: Secondary | ICD-10-CM

## 2016-11-22 DIAGNOSIS — T450X5A Adverse effect of antiallergic and antiemetic drugs, initial encounter: Secondary | ICD-10-CM | POA: Insufficient documentation

## 2016-11-22 DIAGNOSIS — R9431 Abnormal electrocardiogram [ECG] [EKG]: Secondary | ICD-10-CM

## 2016-11-22 DIAGNOSIS — T50905A Adverse effect of unspecified drugs, medicaments and biological substances, initial encounter: Secondary | ICD-10-CM

## 2016-11-22 DIAGNOSIS — Z886 Allergy status to analgesic agent status: Secondary | ICD-10-CM

## 2016-11-22 DIAGNOSIS — R110 Nausea: Secondary | ICD-10-CM

## 2016-11-22 LAB — I-STAT CHEM 8, ED
BUN: 14 mg/dL (ref 6–20)
Calcium, Ion: 1.17 mmol/L (ref 1.15–1.40)
Chloride: 102 mmol/L (ref 101–111)
Creatinine, Ser: 0.9 mg/dL (ref 0.61–1.24)
Glucose, Bld: 97 mg/dL (ref 65–99)
HCT: 45 % (ref 39.0–52.0)
Hemoglobin: 15.3 g/dL (ref 13.0–17.0)
Potassium: 4 mmol/L (ref 3.5–5.1)
Sodium: 136 mmol/L (ref 135–145)
TCO2: 23 mmol/L (ref 0–100)

## 2016-11-22 LAB — I-STAT TROPONIN, ED: Troponin i, poc: 0 ng/mL (ref 0.00–0.08)

## 2016-11-22 LAB — HEMOGLOBIN A1C
Hgb A1c MFr Bld: 5.8 % — ABNORMAL HIGH (ref 4.8–5.6)
Mean Plasma Glucose: 120 mg/dL

## 2016-11-22 LAB — HIV ANTIBODY (ROUTINE TESTING W REFLEX): HIV Screen 4th Generation wRfx: NONREACTIVE

## 2016-11-22 MED ORDER — FAMOTIDINE 20 MG PO TABS: 20.0000 mg | ORAL_TABLET | Freq: Two times a day (BID) | ORAL | 0 refills | Status: DC

## 2016-11-22 MED ORDER — ONDANSETRON HCL 4 MG PO TABS: 4.0000 mg | ORAL_TABLET | Freq: Four times a day (QID) | ORAL | 0 refills | Status: DC | PRN

## 2016-11-22 MED ORDER — LORAZEPAM 1 MG PO TABS: 1.0000 mg | ORAL_TABLET | Freq: Two times a day (BID) | ORAL | 0 refills | Status: DC | PRN

## 2016-11-22 NOTE — Progress Notes (Signed)
Pt has been discharged from facility. IV removed. Discharge instructions reviewed. Pt verbalized discharge instructions. No signs of distress noted.

## 2016-11-22 NOTE — ED Notes (Signed)
Pt seen in the ED earlier today and sent home with rx for zofran. Pt reports he took his zofran as prescribed approx 90 minutes ago and about 30-45 minutes after taking it, became nauseous and sweaty. Pt denies CP, SOB, or difficulty swallowing. Pt does not appear diaphoretic and reports he is no longer nauseous but feels as though "his tongue Is dry". Pt with no other complaints at this time.

## 2016-11-22 NOTE — Discharge Instructions (Signed)
Please return to the ER if you have worsening chest pain, shortness of breath, pain radiating to your jaw, shoulder, or back, sweats or fainting. °

## 2016-11-22 NOTE — Telephone Encounter (Signed)
Recently discharge zofran making him dizzy please call back

## 2016-11-22 NOTE — Progress Notes (Signed)
   Subjective: No acute events overnight.  Still denies chest pain, dyspnea, abdominal pain.  Only continues to have mild-moderate intermittent nausea, and also endorses belching and sour tastes in his mouth.  Objective:  Vital signs in last 24 hours: Vitals:   11/21/16 1600 11/21/16 1620 11/21/16 2100 11/22/16 0500  BP: 129/79 129/84 133/82 125/80  Pulse: 92 93  71  Resp: 22 18 18 18   Temp: 98.9 F (37.2 C) 98.5 F (36.9 C) 98.7 F (37.1 C) 98.2 F (36.8 C)  TempSrc: Oral Oral Oral Oral  SpO2: 96% 100% 96% 99%  Weight:  187 lb 12.8 oz (85.2 kg)  188 lb (85.3 kg)  Height:       Physical Exam  Constitutional: He is oriented to person, place, and time. He appears well-developed and well-nourished. No distress.  Cardiovascular: Normal rate and regular rhythm.   Pulmonary/Chest: Effort normal and breath sounds normal.  Abdominal: Soft. He exhibits no distension. There is no tenderness.  Neurological: He is alert and oriented to person, place, and time.  Psychiatric: He has a normal mood and affect. His behavior is normal.   Cardiac Panel (last 3 results)  Recent Labs  11/21/16 0936 11/21/16 1541 11/21/16 2140  TROPONINI <0.03 <0.03 <0.03   Lab Results  Component Value Date   HGBA1C 5.8 (H) 11/21/2016   Lipid Panel     Component Value Date/Time   CHOL 238 (H) 11/21/2016 1114   TRIG 277 (H) 11/21/2016 1114   HDL 41 11/21/2016 1114   CHOLHDL 5.8 11/21/2016 1114   VLDL 55 (H) 11/21/2016 1114   LDLCALC 142 (H) 11/21/2016 1114   Echocardiogram 11/21/2016 EF 55-60%, normal wall motion, normal diastolic function.  EKG 11/22/2016 NSR, LVH, QRS borderline prolonged, TWI in III, aVF less prominent than on admission.  Assessment/Plan:  Principal Problem:   T wave inversion in EKG Active Problems:   Nausea   Anxiety   51 y.o. male without chest pain and incidentally discovered new T wave inversion ruled out for ACS with serial cardiac markers and a normal  echocardiogram.  His only symptom is subacute-chronic nausea with belching most consistent with GERD.  #Abnormal EKG Asymptomatic, undetectable troponins, normal echocargiogram.  Cardiolgy reviewed chart, does not require further evaluation.  ASCVD 5.1% 10 year risk, low risk. -no further evaluation requireed  #Nausea -Trial PPI/H2 blocker for empiric treatment GERD -Zofran PRN -f/u in clinic  Fluids: none Diet: heart DVT Prophylaxis: lovenox Code Status: full  Dispo: Anticipated discharge today.  Alm BustardMatthew O'Sullivan, MD 11/22/2016, 12:58 PM Pager: [phone]

## 2016-11-22 NOTE — Telephone Encounter (Signed)
TOC appt orignially 11/29/2016 appt has now been moved to 11/23/2016 [facility] Dr. Brayton Cavessullivan

## 2016-11-22 NOTE — Discharge Instructions (Addendum)
You were admitted to the hospital because of changes on the EKG test of your heart that were seen in the Emergency Department.  We determined that your heart is doing well and you are not having a heart attack.  For your nausea, I have prescribed you 2 new medicines.  Take the Famotidine twice per day every day for acid reflux.  For the nausea, you can take Zofran only when you need.  We'll see you in the clinic and check on the nausea.  If you have chest pain or shortness of breath, please call 911 or come back to the ED right away.    Nausea, Adult Nausea is the feeling of an upset stomach or having to vomit. Nausea on its own is not usually a serious concern, but it may be an early sign of a more serious medical problem. As nausea gets worse, it can lead to vomiting. If vomiting develops, or if you are not able to drink enough fluids, you are at risk of becoming dehydrated. Dehydration can make you tired and thirsty, cause you to have a dry mouth, and decrease how often you urinate. Older adults and people with other diseases or a weak immune system are at higher risk for dehydration. The main goals of treating your nausea are:  To limit repeated nausea episodes.  To prevent vomiting and dehydration. Follow these instructions at home: Follow instructions from your health care provider about how to care for yourself at home. Eating and drinking  Follow these recommendations as told by your health care provider:  Take an oral rehydration solution (ORS). This is a drink that is sold at pharmacies and retail stores.  Drink clear fluids in small amounts as you are able. Clear fluids include water, ice chips, diluted fruit juice, and low-calorie sports drinks.  Eat bland, easy-to-digest foods in small amounts as you are able. These foods include bananas, applesauce, rice, lean meats, toast, and crackers.  Avoid drinking fluids that contain a lot of sugar or caffeine, such as energy drinks,  sports drinks, and soda.  Avoid alcohol.  Avoid spicy or fatty foods. General instructions   Drink enough fluid to keep your urine clear or pale yellow.  Wash your hands often. If soap and water are not available, use hand sanitizer.  Make sure that all people in your household wash their hands well and often.  Rest at home while you recover.  Take over-the-counter and prescription medicines only as told by your health care provider.  Breathe slowly and deeply when you feel nauseous.  Watch your condition for any changes.  Keep all follow-up visits as told by your health care provider. This is important. Contact a health care provider if:  You have a headache.  You have new symptoms.  Your nausea gets worse.  You have a fever.  You feel light-headed or dizzy.  You vomit.  You cannot keep fluids down. Get help right away if:  You have pain in your chest, neck, arm, or jaw.  You feel extremely weak or you faint.  You have vomit that is bright red or looks like coffee grounds.  You have bloody or black stools or stools that look like tar.  You have a severe headache, a stiff neck, or both.  You have severe pain, cramping, or bloating in your abdomen.  You have a rash.  You have difficulty breathing or are breathing very quickly.  Your heart is beating very quickly.  Your skin  feels cold and clammy.  You feel confused.  You have pain when you urinate.  You have signs of dehydration, such as:  Dark urine, very little, or no urine.  Cracked lips.  Dry mouth.  Sunken eyes.  Sleepiness.  Weakness. These symptoms may represent a serious problem that is an emergency. Do not wait to see if the symptoms will go away. Get medical help right away. Call your local emergency services (911 in the [place] Do not drive yourself to the hospital. This information is not intended to replace advice given to you by your health care provider. Make sure you discuss  any questions you have with your health care provider. Document Released: 10/05/2004 Document Revised: 01/31/2016 Document Reviewed: 05/04/2015 Elsevier Interactive Patient Education  2017 [employer]

## 2016-11-22 NOTE — Discharge Summary (Signed)
Name: Wesley [name] MRN: [id number] DOB: 27-Aug-1966 51 y.o. PCP: Pcp Not In System  Date of Admission: 11/21/2016  8:10 AM Date of Discharge: 11/22/2016 Attending Physician: Wesley Alias, MD  Discharge Diagnosis:  Principal Problem:   T wave inversion in EKG Active Problems:   Nausea   Anxiety   Discharge Medications: Allergies as of 11/22/2016      Reactions   Motrin [ibuprofen] Other (See Comments)   Makes his throat get "very dry" after taking it      Medication List    STOP taking these medications   diazepam 5 MG tablet Commonly known as:  VALIUM   diclofenac sodium 1 % Gel Commonly known as:  VOLTAREN   FLUoxetine 20 MG tablet Commonly known as:  PROZAC   hydrOXYzine 25 MG tablet Commonly known as:  ATARAX/VISTARIL   lidocaine 5 % Commonly known as:  LIDODERM   methocarbamol 500 MG tablet Commonly known as:  ROBAXIN   traZODone 50 MG tablet Commonly known as:  DESYREL     TAKE these medications   clonazePAM 0.5 MG tablet Commonly known as:  KLONOPIN Take 0.5 mg by mouth 2 (two) times daily as needed for anxiety.   erythromycin ophthalmic ointment Place 0.5 application into the left eye 6 (six) times daily. Place 1/2 inch ribbon into left  lower eyelid six times daily x 10 days   famotidine 20 MG tablet Commonly known as:  PEPCID Take 1 tablet (20 mg total) by mouth 2 (two) times daily.   ondansetron 4 MG tablet Commonly known as:  ZOFRAN Take 1 tablet (4 mg total) by mouth every 6 (six) hours as needed for nausea.   sertraline 25 MG tablet Commonly known as:  ZOLOFT Take 25 mg by mouth 2 (two) times daily.       Disposition and follow-up:   Wesley.Wesley [name] was discharged from [facility] in Good condition.  At the hospital follow up visit please address:  1.  Nausea.  Reports persistent subacute/chronic nausea, possible GERD symptoms.  Please investigate and manage further.   2.  Labs / imaging needed at time  of follow-up: none  3.  Pending labs/ test needing follow-up: none  Follow-up Appointments: Follow-up Information    [facility]. Go in 7 day(s).   Why:  at 10:45 Contact information: [address] [place] [address] [phone]          Hospital Course by problem list: Principal Problem:   T wave inversion in EKG Active Problems:   Nausea   Anxiety   1. Abnormal EKG Wesley [name] was admitted because his EKG showed more prominent T-wave inversions in inferior leads compared to a recent prior.  He denies any symptoms of chest pain, dyspnea, epigastric pain, or exertional symptoms.  Ruled out for ACS with serial undetectable troponins and a normal echocardiogram, low cardiac risk with ASCVD 10 year risk of 5.1%.   Cardiology reviewed his chart, no indication for further cardiac workup.  2. Nausea Chronic nausea associated with belching and sour tastes in his mouth, no abdominal pain.  This is a chronic, stable, recurrent issue which needs outpatient evaluation.  He is able to eat and drink adequately. Potentially gastritis or GERD exacerbated by cocaine abuse.   Abdominal US unremarkable with the exception of hepatis steatosis.  Prescribed H2 blocker and zofran for symptomatic treatment on discharge with clinic follow-up arranged.  Discharge Vitals:   BP 125/80 (  BP Location: Right Arm)   Pulse 71   Temp 98.2 F (36.8 C) (Oral)   Resp 18   Ht 5\' 6"  (1.676 m)   Wt 188 lb (85.3 kg)   SpO2 99%   BMI 30.34 kg/m   Pertinent Labs, Studies, and Procedures:   Cardiac Panel (last 3 results)  Recent Labs  11/21/16 0936 11/21/16 1541 11/21/16 2140  TROPONINI <0.03 <0.03 <0.03   Lab Results  Component Value Date   HGBA1C 5.8 (H) 11/21/2016   Lipid Panel     Component Value Date/Time   CHOL 238 (H) 11/21/2016 1114   TRIG 277 (H) 11/21/2016 1114   HDL 41 11/21/2016 1114   CHOLHDL 5.8 11/21/2016 1114   VLDL 55 (H) 11/21/2016 1114    LDLCALC 142 (H) 11/21/2016 1114   Echocardiogram   Discharge Instructions: Discharge Instructions    Diet - low sodium heart healthy    Complete by:  As directed    Increase activity slowly    Complete by:  As directed     You were admitted to the hospital because of changes on the EKG test of your heart that were seen in the Emergency Department.  We determined that your heart is doing well and you are not having a heart attack.  For your nausea, I have prescribed you 2 new medicines.  Take the Famotidine twice per day every day for acid reflux.  For the nausea, you can take Zofran only when you need.  We'll see you in the clinic and check on the nausea.  If you have chest pain or shortness of breath, please call 911 or come back to the ED right away.   Signed: Alm BustardMatthew O'Sullivan, MD 11/22/2016, 11:31 AM   Pager: [phone]

## 2016-11-22 NOTE — Progress Notes (Signed)
Patient identified as a possible chest pain center patient. Chart personally reviewed. Presenting complaint was nausea and EKG shows LVH by voltage with deep inferior and lateral TWI's, more c/w repolarization abnormality. No record of chest pain. Troponin negative x 3. Echo yesterday shows LVEF 55-60%, normal wall thickness (no LVH), wall motion and normal diastolic function. EKG changes may be electrolyte related.  Would not recommend further cardiac work-up.   Chrystie NoseKenneth C. Hilty, MD, [facility] AuthorityFACC Attending Cardiologist [facility]

## 2016-11-22 NOTE — ED Triage Notes (Signed)
Pt reports he was just released from hospital today for abnormal ekg and was cleared for cardiac issues and was discharged with zofran. Pt states 1 hr ago after zofran he became sweating and feels hot all over his body. Pt denies any pain no sob, no rash.

## 2016-11-22 NOTE — ED Provider Notes (Signed)
[facility]-EMERGENCY DEPT Provider Note   CSN: [id number] Arrival date & time: 11/22/16  1527     History   Chief Complaint Chief Complaint  Patient presents with  . Nausea    HPI Wesley [name] is a 51 y.o. male.  HPI Pt with hx of alcohol abuse, cocaine abuse and anxiety comes in with cc of  sweating. Pt was admitted just yday and discharged earlier today. Admission was for ACS. Pt states that after discharge he took some zofran - and 30 minutes after he started getting sweaty and felt dizzy. Symptoms lasted for few minutes. Pt also felt hot and had palpitations. He didn't have any near syncope, dib, chest pain, abd pain. Pt is also having dryness in his tongue. No numbness, tingling , weakness, vision changes.  Past Medical History:  Diagnosis Date  . Alcohol abuse   . Anxiety   . Chronic neck pain   . Cocaine abuse     Patient Active Problem List   Diagnosis Date Noted  . Epigastric abdominal pain 11/21/2016  . T wave inversion in EKG 11/21/2016  . Nausea 11/21/2016  . Anxiety 11/21/2016    Past Surgical History:  Procedure Laterality Date  . EYE SURGERY Right ~ 1980   "hit my eye"       Home Medications    Prior to Admission medications   Medication Sig Start Date End Date Taking? Authorizing Provider  clonazePAM (KLONOPIN) 0.5 MG tablet Take 0.5 mg by mouth 2 (two) times daily as needed for anxiety.    Historical Provider, MD  erythromycin ophthalmic ointment Place 0.5 application into the left eye 6 (six) times daily. Place 1/2 inch ribbon into left  lower eyelid six times daily x 10 days 11/15/16   Historical Provider, MD  famotidine (PEPCID) 20 MG tablet Take 1 tablet (20 mg total) by mouth 2 (two) times daily. 11/22/16 12/22/16  Alm Bustard, MD  ondansetron (ZOFRAN) 4 MG tablet Take 1 tablet (4 mg total) by mouth every 6 (six) hours as needed for nausea. 11/22/16   Alm Bustard, MD  sertraline (ZOLOFT) 25 MG tablet Take 25 mg by mouth 2 (two) times  daily.    Historical Provider, MD    Family History Family History  Problem Relation Age of Onset  . Hypertension Mother   . Hypertension Father     Social History Social History  Substance Use Topics  . Smoking status: Never Smoker  . Smokeless tobacco: Never Used  . Alcohol use Yes     Comment: 11/21/2016 "last drank 06/2016"     Allergies   Motrin [ibuprofen]   Review of Systems Review of Systems  Constitutional: Positive for diaphoresis.  Cardiovascular: Positive for palpitations.    ROS 10 Systems reviewed and are negative for acute change except as noted in the HPI.      Physical Exam Updated Vital Signs BP 141/90 (BP Location: Right Arm)   Pulse 95   Temp 98.2 F (36.8 C) (Oral)   Resp 14   SpO2 100%   Physical Exam  Constitutional: He is oriented to person, place, and time. He appears well-developed.  HENT:  Head: Normocephalic and atraumatic.  Eyes: Conjunctivae and EOM are normal. Pupils are equal, round, and reactive to light.  Neck: Normal range of motion. Neck supple.  Cardiovascular: Normal rate, regular rhythm and intact distal pulses.   Pulmonary/Chest: Effort normal and breath sounds normal.  Abdominal: Soft. Bowel sounds are normal. He exhibits no distension and no  mass. There is no tenderness. There is no rebound and no guarding.  Musculoskeletal: He exhibits no deformity.  Neurological: He is alert and oriented to person, place, and time. No cranial nerve deficit.  Skin: Skin is warm.  Nursing note and vitals reviewed.    ED Treatments / Results  Labs (all labs ordered are listed, but only abnormal results are displayed) Labs Reviewed  I-STAT CHEM 8, ED  Rosezena SensorI-STAT TROPOININ, ED    EKG  EKG Interpretation  Date/Time:  Wednesday November 22 2016 19:13:31 EDT Ventricular Rate:  96 PR Interval:  130 QRS Duration: 100 QT Interval:  348 QTC Calculation: 439 R Axis:   -40 Text Interpretation:  Normal sinus rhythm Left axis  deviation Minimal voltage criteria for LVH, may be normal variant Abnormal ECG No significant change since last tracing Confirmed by Rhunette CroftNANAVATI, MD, Janey GentaANKIT [phone]) on 11/22/2016 8:49:05 PM       Radiology Dg Chest 2 View  Result Date: 11/21/2016 CLINICAL DATA:  Chest and epigastric pain. EXAM: CHEST  2 VIEW COMPARISON:  11/07/2016 and 09/10/2016 FINDINGS: The heart size and mediastinal contours are within normal limits. Both lungs are clear. The visualized skeletal structures are unremarkable. IMPRESSION: No active cardiopulmonary disease. Electronically Signed   By: Myles RosenthalJohn  Stahl M.D.   On: 11/21/2016 10:52   [place] Abdomen Complete  Result Date: 11/21/2016 CLINICAL DATA:  51 year old male with nausea x3 months. EXAM: ABDOMEN ULTRASOUND COMPLETE COMPARISON:  None. FINDINGS: Gallbladder: No gallstones or wall thickening visualized. No sonographic Murphy sign noted by sonographer. Common bile duct: Diameter: 4 mm Liver: There is diffuse increased hepatic echogenicity most consistent with fatty infiltration. Underlying fibrosis or inflammatory changes is not excluded. The main portal vein is patent with appropriate flow direction. IVC: Not visualized and obscured by bowel gas. Pancreas: Not visualized and obscured by bowel gas. Spleen: Size and appearance within normal limits. Right Kidney: Length: 10.3 cm. Echogenicity within normal limits. No mass or hydronephrosis visualized. Left Kidney: Length: 11.3 cm. Echogenicity within normal limits. No mass or hydronephrosis visualized. Abdominal aorta: Not well visualized and obscured by bowel gas. Other findings: None. IMPRESSION: 1. No gallstone. 2. Fatty liver. Electronically Signed   By: Elgie CollardArash  Radparvar M.D.   On: 11/21/2016 20:05    Procedures Procedures (including critical care time)  Medications Ordered in ED Medications - No data to display   Initial Impression / Assessment and Plan / ED Course  I have reviewed the triage vital signs and the nursing  notes.  Pertinent labs & imaging results that were available during my care of the patient were reviewed by me and considered in my medical decision making (see chart for details).  Clinical Course as of Nov 23 2114  Wed Nov 22, 2016  2116 Strict ER return precautions have been discussed, and patient is agreeing with the plan and is comfortable with the workup done and the recommendations from the ER.   [AN]    Clinical Course User Index [AN] Derwood KaplanAnkit Nanavati, MD    Pt comes in with what appears like to have been a vasovagal episode. The symptoms started after zofran was taken. I dont think this was allergic reaction, I would favor more of an adverse effect.  We will get orthostatics and ekg and trop and chem 8. Pt had no chest pain, near syncope, dib which is reassuring. Pt had palpitations. His recent visit had no significant cardiac abn. - I dont think pt needs tele admission.     Final  Clinical Impressions(s) / ED Diagnoses   Final diagnoses:  Vasovagal reaction  Adverse effect of drug, initial encounter    New Prescriptions New Prescriptions   No medications on file     Derwood Kaplan, MD 11/22/16 2116

## 2016-11-22 NOTE — Telephone Encounter (Signed)
Spoke w/ dr Peggyann Jubao'sullivan, pt is to stop zofran and continue other medicines, if he feels no better call back, when pt was notified he states he is weak and his body is sweating, he does not feel well, appt was offered for tomorrow but pt states he needs to be seen now, he is advised to go to ED asap, appt left in place for tomorrow, pt states he is going to ED now

## 2016-11-22 NOTE — ED Notes (Signed)
Pt given coca cola.  

## 2016-11-23 ENCOUNTER — Emergency Department (HOSPITAL_COMMUNITY): Payer: Self-pay

## 2016-11-23 ENCOUNTER — Other Ambulatory Visit: Payer: Self-pay

## 2016-11-23 ENCOUNTER — Ambulatory Visit: Payer: Self-pay

## 2016-11-23 ENCOUNTER — Encounter (HOSPITAL_COMMUNITY): Payer: Self-pay | Admitting: Emergency Medicine

## 2016-11-23 ENCOUNTER — Emergency Department (HOSPITAL_COMMUNITY)
Admission: EM | Admit: 2016-11-23 | Discharge: 2016-11-23 | Disposition: A | Discharge status: Home / Self Care | Payer: Self-pay | Attending: Emergency Medicine | Admitting: Emergency Medicine

## 2016-11-23 DIAGNOSIS — R072 Precordial pain: Secondary | ICD-10-CM | POA: Insufficient documentation

## 2016-11-23 LAB — I-STAT CHEM 8, ED
BUN: 13 mg/dL (ref 6–20)
Calcium, Ion: 1.14 mmol/L — ABNORMAL LOW (ref 1.15–1.40)
Chloride: 101 mmol/L (ref 101–111)
Creatinine, Ser: 0.9 mg/dL (ref 0.61–1.24)
Glucose, Bld: 114 mg/dL — ABNORMAL HIGH (ref 65–99)
HCT: 45 % (ref 39.0–52.0)
Hemoglobin: 15.3 g/dL (ref 13.0–17.0)
Potassium: 3.9 mmol/L (ref 3.5–5.1)
Sodium: 139 mmol/L (ref 135–145)
TCO2: 26 mmol/L (ref 0–100)

## 2016-11-23 LAB — I-STAT TROPONIN, ED: Troponin i, poc: 0 ng/mL (ref 0.00–0.08)

## 2016-11-23 NOTE — ED Notes (Signed)
Patient sent to xray

## 2016-11-23 NOTE — ED Triage Notes (Addendum)
Pt reports d/c yesterday after cardiac workup, states he has an appt at the clinic today but is having right sided chest pain that began again this morning. Reports taking famotidine this am just before arriving. resp e/u, nad.

## 2016-11-23 NOTE — Telephone Encounter (Signed)
The appt for today is being cancelled, pt remains in ED, the appt today was for a problem not hosp f/u, he will need a HFU appt scheduled

## 2016-11-24 ENCOUNTER — Emergency Department (HOSPITAL_COMMUNITY)
Admission: EM | Admit: 2016-11-24 | Discharge: 2016-11-24 | Disposition: A | Discharge status: Home / Self Care | Payer: Self-pay | Attending: Physician Assistant | Admitting: Physician Assistant

## 2016-11-24 ENCOUNTER — Encounter (HOSPITAL_COMMUNITY): Payer: Self-pay | Admitting: Neurology

## 2016-11-24 ENCOUNTER — Ambulatory Visit (INDEPENDENT_AMBULATORY_CARE_PROVIDER_SITE_OTHER): Payer: Self-pay | Admitting: Internal Medicine

## 2016-11-24 VITALS — BP 136/93 | HR 76 | Temp 98.6°F | Wt 188.6 lb

## 2016-11-24 DIAGNOSIS — R531 Weakness: Secondary | ICD-10-CM | POA: Insufficient documentation

## 2016-11-24 DIAGNOSIS — Z6830 Body mass index (BMI) 30.0-30.9, adult: Secondary | ICD-10-CM

## 2016-11-24 DIAGNOSIS — R630 Anorexia: Secondary | ICD-10-CM

## 2016-11-24 DIAGNOSIS — Z5321 Procedure and treatment not carried out due to patient leaving prior to being seen by health care provider: Secondary | ICD-10-CM | POA: Insufficient documentation

## 2016-11-24 DIAGNOSIS — F419 Anxiety disorder, unspecified: Secondary | ICD-10-CM

## 2016-11-24 DIAGNOSIS — R110 Nausea: Secondary | ICD-10-CM

## 2016-11-24 DIAGNOSIS — R61 Generalized hyperhidrosis: Secondary | ICD-10-CM

## 2016-11-24 LAB — BASIC METABOLIC PANEL
Anion gap: 9 (ref 5–15)
BUN: 13 mg/dL (ref 6–20)
CO2: 25 mmol/L (ref 22–32)
Calcium: 9.3 mg/dL (ref 8.9–10.3)
Chloride: 102 mmol/L (ref 101–111)
Creatinine, Ser: 0.79 mg/dL (ref 0.61–1.24)
GFR calc Af Amer: >60 mL/min (ref >60)
GFR calc non Af Amer: >60 mL/min (ref >60)
Glucose, Bld: 112 mg/dL — ABNORMAL HIGH (ref 65–99)
Potassium: 3.8 mmol/L (ref 3.5–5.1)
Sodium: 136 mmol/L (ref 135–145)

## 2016-11-24 LAB — I-STAT TROPONIN, ED: Troponin i, poc: 0 ng/mL (ref 0.00–0.08)

## 2016-11-24 LAB — CBC
HCT: 43.3 % (ref 39.0–52.0)
Hemoglobin: 14.3 g/dL (ref 13.0–17.0)
MCH: 28 pg (ref 26.0–34.0)
MCHC: 33 g/dL (ref 30.0–36.0)
MCV: 84.7 fL (ref 78.0–100.0)
Platelets: 262 10*3/uL (ref 150–400)
RBC: 5.11 MIL/uL (ref 4.22–5.81)
RDW: 13.5 % (ref 11.5–15.5)
WBC: 7.4 10*3/uL (ref 4.0–10.5)

## 2016-11-24 MED ORDER — CITALOPRAM HYDROBROMIDE 20 MG PO TABS: 20.0000 mg | ORAL_TABLET | Freq: Every day | ORAL | 1 refills | Status: DC

## 2016-11-24 MED ORDER — PROMETHAZINE HCL 12.5 MG PO TABS: 12.5000 mg | ORAL_TABLET | Freq: Four times a day (QID) | ORAL | 0 refills | Status: DC | PRN

## 2016-11-24 NOTE — ED Notes (Signed)
Called pt name in the lobby to be roomed but no answer.

## 2016-11-24 NOTE — Assessment & Plan Note (Addendum)
Reports intermittent nausea and poor appetite. Has an Rx for Zofran but had an episode of anxiety and discomfort afterward and states he is unwilling to take this medication again.  Plan: - Provided Rx for Phenergan PRN

## 2016-11-24 NOTE — ED Triage Notes (Signed)
Today he took a centrum multivitamin, which is the first time he has had it in a long time. This morning he felt dizzy. After taking the vitamin, he felt hot all over and had generalized weakness to his body. He still feels dizzy. Is a x 4. Had an appointment to outpatient clinic for later today, but felt he couldn't wait. Also reports nausea and just feeling like his body is hot.

## 2016-11-24 NOTE — Patient Instructions (Signed)
When you have episodes of anxiety and sweating - please try deep breathing exercises for 5 minutes.  Please also try to exercise regularly and stretch your back muscles. You can also try Tylenol or Ibuprofen or heating pads.  We have prescribed Celexa, one tablet daily. Please start taking this medication.  We will see you in one week.

## 2016-11-24 NOTE — ED Provider Notes (Addendum)
[facility]-EMERGENCY DEPT Provider Note   CSN: [id number] Arrival date & time: 11/23/16  7829     History   Chief Complaint Chief Complaint  Patient presents with  . Chest Pain    HPI Wesley [name] is a 51 y.o. male.  HPI Patient is a long-standing history of anxiety and recent evaluation in the hospital for cardiac chest pain.  Found to have noncardiac chest pain presents to the emergency department today because of a mild pleuritic right-sided chest discomfort.  No fevers or chills.  Denies productive cough.  He tried famotidine this morning without improvement in his symptoms.  He has an appointment later today.    Past Medical History:  Diagnosis Date  . Alcohol abuse   . Anxiety   . Chronic neck pain   . Cocaine abuse     Patient Active Problem List   Diagnosis Date Noted  . Epigastric abdominal pain 11/21/2016  . T wave inversion in EKG 11/21/2016  . Nausea 11/21/2016  . Anxiety 11/21/2016    Past Surgical History:  Procedure Laterality Date  . EYE SURGERY Right ~ 1980   "hit my eye"       Home Medications    Prior to Admission medications   Medication Sig Start Date End Date Taking? Authorizing Provider  famotidine (PEPCID) 20 MG tablet Take 1 tablet (20 mg total) by mouth 2 (two) times daily. 11/22/16 12/22/16 Yes Alm Bustard, MD  ondansetron (ZOFRAN) 4 MG tablet Take 1 tablet (4 mg total) by mouth every 6 (six) hours as needed for nausea. 11/22/16  Yes Alm Bustard, MD  LORazepam (ATIVAN) 1 MG tablet Take 1 tablet (1 mg total) by mouth 2 (two) times daily as needed for anxiety. Patient not taking: Reported on 11/23/2016 11/22/16   Derwood Kaplan, MD    Family History Family History  Problem Relation Age of Onset  . Hypertension Mother   . Hypertension Father     Social History Social History  Substance Use Topics  . Smoking status: Never Smoker  . Smokeless tobacco: Never Used  . Alcohol use Yes     Comment: 11/21/2016 "last drank  06/2016"     Allergies   Motrin [ibuprofen] and Zofran [ondansetron hcl]   Review of Systems Review of Systems   Physical Exam Updated Vital Signs BP 108/87   Pulse 75   Temp 98.4 F (36.9 C) (Oral)   Resp 25   SpO2 96%   Physical Exam  Constitutional: He is oriented to person, place, and time. He appears well-developed and well-nourished.  HENT:  Head: Normocephalic and atraumatic.  Eyes: EOM are normal.  Neck: Normal range of motion.  Cardiovascular: Normal rate, regular rhythm, normal heart sounds and intact distal pulses.   Pulmonary/Chest: Effort normal and breath sounds normal. No respiratory distress.  Abdominal: Soft. He exhibits no distension. There is no tenderness.  Musculoskeletal: Normal range of motion.  Neurological: He is alert and oriented to person, place, and time.  Skin: Skin is warm and dry.  Psychiatric: He has a normal mood and affect. Judgment normal.  Nursing note and vitals reviewed.    ED Treatments / Results  Labs (all labs ordered are listed, but only abnormal results are displayed) Labs Reviewed  I-STAT CHEM 8, ED - Abnormal; Notable for the following:       Result Value   Glucose, Bld 114 (*)    Calcium, Ion 1.14 (*)    All other components within normal limits  I-STAT  TROPOININ, ED    EKG  EKG Interpretation  Date/Time:  Thursday November 23 2016 09:38:35 EDT Ventricular Rate:  105 PR Interval:  126 QRS Duration: 102 QT Interval:  340 QTC Calculation: 449 R Axis:   -63 Text Interpretation:  Sinus tachycardia Left anterior fascicular block Abnormal ECG No significant change was found Confirmed by CAMPOS  MD, Caryn BeeKEVIN ([id number]) on 11/23/2016 9:47:01 AM Also confirmed by Patria ManeAMPOS  MD, Caryn BeeKEVIN ([id number]), editor [place] CT, Jola BabinskiMarilyn [phone]([id number])  on 11/23/2016 10:02:02 AM       Radiology Dg Chest 2 View  Result Date: 11/23/2016 CLINICAL DATA:  Onset of chest pain earlier today. No known cardiac history. Nonsmoker. EXAM: CHEST  2 VIEW COMPARISON:   PA and lateral chest x-ray of November 21, 2016. FINDINGS: The lungs are mildly hypoinflated. The interstitial markings are coarse though stable. The heart is top-normal in size. The pulmonary vascularity is not engorged. There is no pleural effusion. The observed bony thorax exhibits no acute abnormality. IMPRESSION: Mild hypoinflation. No definite pneumonia nor CHF nor other acute cardiopulmonary abnormality. Electronically Signed   By: David  Swazil[place]Jordan M.D.   On: 11/23/2016 10:09    Procedures Procedures (including critical care time)  Medications Ordered in ED Medications - No data to display   Initial Impression / Assessment and Plan / ED Course  I have reviewed the triage vital signs and the nursing notes.  Pertinent labs & imaging results that were available during my care of the patient were reviewed by me and considered in my medical decision making (see chart for details).     Well-appearing.  Recent workup for cardiac chest pain.  Doubt ACS.  Doubt PE.  Stable for discharge.  Primary care follow-up.  Final Clinical Impressions(s) / ED Diagnoses   Final diagnoses:  Precordial pain    New Prescriptions Discharge Medication List as of 11/23/2016 11:54 AM       Azalia BilisKevin Campos, MD 11/24/16 1546    Azalia BilisKevin Campos, MD 12/07/16 1140

## 2016-11-24 NOTE — Assessment & Plan Note (Addendum)
Increasingly frequent episodes of sudden anxiety, sweating, dysphoria seems most consistent with panic disorder but overall seems to have increased baseline anxiety and poor coping skills. Generalized anxiety disorder or some component of fibromyalgia seems likely as well. He denies some classic symptoms of panic disorder such as palpitations but overall seems the most likely diagnosis. He does not have any weight loss of hypertension, hyperthyroidism or pheo should be ruled out.  Plan: - Obtain TSH today - Prescribed Celexa 20 mg daily, will hold off on benzodiazepines for now, can consider at future visit - Provided reassurance and discussed deep breathing exercises and other coping skills - Discussed over-utilization of the ED and advised scheduling same day clinic visits or coping at home until the episode resides - Advised to return in 1 week

## 2016-11-24 NOTE — Progress Notes (Addendum)
   CC: Episodes of anxiety and sweating  HPI:  Wesley [name] is a 51 y.o. male with PMHx detailed below presenting with intermittent episodes of sweating and anxiety over the past several months, He states his problems began after he had a fall from a roof while working in September 2017. He is also endorsing various somatic complaints such as abdominal discomfort, arm pain, hand and knee tingling, dizziness, and fatigue. These persistent symptoms have resulted in innumerable ED visits (42 in the last 6 months) with repeated workups for chest pain, vasovagal symptoms, back pain, neck pain, numbness, insomnia, nausea, and abdominal pain. Today his main concern is these episodes of anxiety that coincide with an impending sense of doom and "feeling like I'm going to die." He reporst feeling very hot and sweaty when these occur, and they are becoming more frequent, now daily. They often occur at rest, or during no particular activity.   Today he reports experience episodes after taking first a Zofran tablet, and then later a multivitamin pill. His first impulse is to go to the ED. He reports vague intermittent abdominal pain, nausea, poor appetite, "weak legs", and insomnia. He has an empty bottle of Diazepam which he is requesting a Rx for, stating that this has helped him in the past.   See problem based assessment and plan below for additional details.  Past Medical History:  Diagnosis Date  . Alcohol abuse   . Anxiety   . Chronic neck pain   . Cocaine abuse     Review of Systems: Review of Systems  Constitutional: Positive for chills, diaphoresis and malaise/fatigue. Negative for fever and weight loss.  Respiratory: Positive for cough. Negative for shortness of breath.   Cardiovascular: Positive for chest pain. Negative for palpitations and orthopnea.  Gastrointestinal: Positive for abdominal pain and nausea. Negative for constipation, diarrhea and vomiting.  Genitourinary: Negative for  dysuria.  Musculoskeletal: Positive for back pain and neck pain.  Neurological: Positive for dizziness, tingling, sensory change, weakness and headaches. Negative for focal weakness.  Psychiatric/Behavioral: The patient is nervous/anxious and has insomnia.      Physical Exam: Vitals:   11/24/16 1619  BP: (!) 136/93  Pulse: 76  Temp: 98.6 F (37 C)  TempSrc: Oral  SpO2: 99%  Weight: 188 lb 9.6 oz (85.5 kg)   Body mass index is 30.44 kg/m. GENERAL- Unkempt man sitting comfortably in exam room chair, alert, in no distress, conversational HEENT- Atraumatic, PERRL, moist mucous membranes, poor dentition CARDIAC- Regular rate and rhythm, no murmurs, rubs or gallops RESP- Clear to ascultation bilaterally, no wheezing or crackles, normal work of breathing ABDOMEN- Soft, mild diffuse tenderness to palpation nondistended BACK- Normal curvature, mild bilateral paraspinal tenderness and tense musculature NEURO- Alert and oriented, cranial nerves grossly intact EXTREMITIES- Normal bulk and range of motion, no edema, 2+ peripheral pulses SKIN- Warm, dry, intact, scattered scratches and abrasions, cardiac monitor stickers still present on his chest from recent ED visit PSYCH- Anxious affect, clear speech  Assessment & Plan:   See encounters tab for problem based medical decision making.  Patient discussed with Dr. Cleda DaubE. Hoffman

## 2016-11-25 LAB — TSH: TSH: 2.21 u[IU]/mL (ref 0.450–4.500)

## 2016-11-27 ENCOUNTER — Encounter: Payer: Self-pay | Admitting: Internal Medicine

## 2016-11-27 ENCOUNTER — Ambulatory Visit (INDEPENDENT_AMBULATORY_CARE_PROVIDER_SITE_OTHER): Payer: Self-pay | Admitting: Internal Medicine

## 2016-11-27 VITALS — BP 120/67 | HR 81 | Temp 98.4°F | Ht 66.0 in | Wt 188.2 lb

## 2016-11-27 DIAGNOSIS — M542 Cervicalgia: Secondary | ICD-10-CM

## 2016-11-27 DIAGNOSIS — R110 Nausea: Secondary | ICD-10-CM

## 2016-11-27 DIAGNOSIS — R202 Paresthesia of skin: Secondary | ICD-10-CM

## 2016-11-27 DIAGNOSIS — M25512 Pain in left shoulder: Secondary | ICD-10-CM

## 2016-11-27 DIAGNOSIS — M549 Dorsalgia, unspecified: Secondary | ICD-10-CM | POA: Insufficient documentation

## 2016-11-27 DIAGNOSIS — F419 Anxiety disorder, unspecified: Secondary | ICD-10-CM

## 2016-11-27 DIAGNOSIS — M546 Pain in thoracic spine: Secondary | ICD-10-CM

## 2016-11-27 MED ORDER — CYCLOBENZAPRINE HCL 10 MG PO TABS: 10.0000 mg | ORAL_TABLET | Freq: Three times a day (TID) | ORAL | 0 refills | Status: DC | PRN

## 2016-11-27 MED ORDER — NAPROXEN 500 MG PO TABS: 800.0000 mg | ORAL_TABLET | Freq: Three times a day (TID) | ORAL | 1 refills | Status: DC

## 2016-11-27 NOTE — Progress Notes (Signed)
Internal Medicine Clinic Attending  Case discussed with Dr. Guilloud at the time of the visit.  We reviewed the resident's history and exam and pertinent patient test results.  I agree with the assessment, diagnosis, and plan of care documented in the resident's note.  

## 2016-11-27 NOTE — Assessment & Plan Note (Addendum)
Patient reports symptoms of generalized anxiety and insomnia. He was prescribed Celexa 20 mg daily and his last visit three days ago and says he just started this medication yesterday. He is endorsing symptoms of nausea. I educated patient that this medication often takes 4-6 weeks to become effective and that nausea/upset stomach are common side effects after starting this medication. I encouraged him to take his Phenergan as needed to treat the nausea and reassured him that the side effects should improve over the next week. If nausea does not improve by next week, I instructed patient to give our office a call. I also encouraged him to take the medication at night in order to hopefully sleeping through some of the side effects. -- Continue Celexa 20 mg, titrate to efficacy  -- Phenergan prn for nausea  -- F/u 1 week if nausea not improved
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
## 2016-11-27 NOTE — Assessment & Plan Note (Addendum)
Patient is complaining of bilateral posterior neck pain that he describes as a problem with his "nervous system". History is limited due to language barrier and he has a difficult time further characterizing this pain for me. He also reports pain in his left shoulder and mid back. He says this often keeps him up at night. He denies that the pain radiates down his arms. He dose endorse some numbness and tingling of his bilateral fingers however the distribution is not consistent with carpal tunnel. Tinel and Phalen's sign were negative on exam. Recent CT of his cervical spine in November 2017 was negative for acute fracture, stenosis, or foraminal narrowing. His description is vague, however, I do not feel it is consistent with cervical radiculopathy. Pain is likely musculoskeletal in etiology. He has tenderness to palpation of his cervical and thoracic paraspinal muscles on exam. We discussed referral to physical therapy and patient was agreeable with this plan. Unfortuantely, he is uninsured and in the process of getting his orange card. We will plan to treat with naproxen 800 mg 3 times a day, Flexeril 10 mg 3 times prn, and refer to physical therapy when he is approved for the orange card.  -- Naproxen 800 mg TID -- Flexeril 10 mg TID prn  -- F/u 2 weeks -- Refer for PT once approved for orange card

## 2016-11-27 NOTE — Patient Instructions (Addendum)
Wesley [name],  It was a pleasure to meet you today. I am sorry to hear about your neck and back pain. Please take naproxen 800 mg (1 1/2 tablets) three times daily for your pain. You may also take flexeril up to three times daily as needed. This medicine is a muscle relaxer. Please do not drive while taking this medicine as it may make you sleepy. Once you have been approved for the orange card, we can refer you to physical therapy for the neck and back exercises. Please follow up in 2 weeks or sooner if you need us. If you have any questions or concerns, call our clinic at [phone] or after hours call [phone] and ask for the internal medicine resident on call. Thank you!  - Dr. Antony ContrasGuilloud

## 2016-11-27 NOTE — Progress Notes (Signed)
Internal Medicine Clinic Attending  Case discussed with Dr. Johnson at the time of the visit.  We reviewed the resident's history and exam and pertinent patient test results.  I agree with the assessment, diagnosis, and plan of care documented in the resident's note.  

## 2016-11-28 ENCOUNTER — Encounter: Payer: Self-pay | Admitting: Internal Medicine

## 2016-11-29 ENCOUNTER — Ambulatory Visit: Payer: Self-pay

## 2016-11-29 ENCOUNTER — Emergency Department (HOSPITAL_COMMUNITY)
Admission: EM | Admit: 2016-11-29 | Discharge: 2016-11-29 | Disposition: A | Discharge status: Home / Self Care | Payer: Self-pay | Attending: Dermatology | Admitting: Dermatology

## 2016-11-29 ENCOUNTER — Encounter (HOSPITAL_COMMUNITY): Payer: Self-pay | Admitting: Emergency Medicine

## 2016-11-29 ENCOUNTER — Emergency Department (HOSPITAL_COMMUNITY)
Admission: EM | Admit: 2016-11-29 | Discharge: 2016-11-29 | Disposition: A | Discharge status: Home / Self Care | Payer: Self-pay | Attending: Emergency Medicine | Admitting: Emergency Medicine

## 2016-11-29 DIAGNOSIS — R202 Paresthesia of skin: Secondary | ICD-10-CM | POA: Insufficient documentation

## 2016-11-29 DIAGNOSIS — Z5321 Procedure and treatment not carried out due to patient leaving prior to being seen by health care provider: Secondary | ICD-10-CM | POA: Insufficient documentation

## 2016-11-29 DIAGNOSIS — R110 Nausea: Secondary | ICD-10-CM | POA: Insufficient documentation

## 2016-11-29 DIAGNOSIS — M542 Cervicalgia: Secondary | ICD-10-CM | POA: Insufficient documentation

## 2016-11-29 DIAGNOSIS — G8929 Other chronic pain: Secondary | ICD-10-CM | POA: Insufficient documentation

## 2016-11-29 MED ORDER — METOCLOPRAMIDE HCL 10 MG PO TABS: 10.0000 mg | ORAL_TABLET | Freq: Three times a day (TID) | ORAL | 0 refills | Status: DC | PRN

## 2016-11-29 MED ORDER — METOCLOPRAMIDE HCL 10 MG PO TABS
10.0000 mg | ORAL_TABLET | Freq: Once | ORAL | Status: AC
  Administered 2016-11-29: 10 mg via ORAL
  Filled 2016-11-29: qty 1

## 2016-11-29 NOTE — ED Triage Notes (Signed)
Pt states he has been feeling "tense and body shaking" and having nausea since yesterday. Pt takes citalopram and concerned he is having a reaction. Pt does not have any visible tremors in triage.

## 2016-11-29 NOTE — ED Notes (Signed)
Pt called for in waiting area no answer.  

## 2016-11-29 NOTE — ED Triage Notes (Signed)
Pt presents to ER for possible medication reaction where he feels tingling and "moving" from neck to top of head; pt denies pain, denies injury, no other focal deficits noted

## 2016-11-29 NOTE — ED Notes (Signed)
Pt called for in waiting area x3. No answer

## 2016-11-29 NOTE — ED Notes (Signed)
Pt given coca cola per Karen(RN)

## 2016-11-29 NOTE — ED Notes (Addendum)
Pt c/o 2nd day of taking citalopram- now feels like a "nausea in stomach and shaking and nervous all over"  Has taken phenergan 12.5mg  20 minutes ago.  Pt was recently in hospital for chest pain/chronic nausea last week.

## 2016-11-29 NOTE — ED Provider Notes (Signed)
[facility]EM[facility]Y DEPT Provider Note   CSN: [id number] Arrival date & time: 11/29/16  1018     History   Chief Complaint Chief Complaint  Patient presents with  . Nausea    HPI Wesley [name] is a [age] y.o. male with pmhx anxiety, alcohol abuse, cocaine abuse, and chronic neck pain who presents to the ED today with chief complaint nausea. He states he woke up with nausea and 5/10 dull epigastric pain. Has not had anything to eat or drink today. Denies vomiting, diarrhea, melena, dysuria, and hematuria. Denies HA, dizziness, syncope, CP/SOB, fever/chills. Of note he states he felt "my nerves jumping" last night and took his celexa; states tingling and neck pain are chronic and unchanged after an accident years ago, and his primary concern is his nausea.   The history is provided by the patient.    Past Medical History:  Diagnosis Date  . Alcohol abuse   . Anxiety   . Chronic neck pain   . Cocaine abuse     Patient Active Problem List   Diagnosis Date Noted  . Back pain 11/27/2016  . Bilateral posterior neck pain 11/27/2016  . Epigastric abdominal pain 11/21/2016  . T wave inversion in EKG 11/21/2016  . Nausea 11/21/2016  . Anxiety 11/21/2016    Past Surgical History:  Procedure Laterality Date  . EYE SURGERY Right ~ 1980   "hit my eye"       Home Medications    Prior to Admission medications   Medication Sig Start Date End Date Taking? Authorizing Provider  citalopram (CELEXA) 20 MG tablet Take 1 tablet (20 mg total) by mouth daily. 11/24/16   Althia FortsAdam Johnson, MD  cyclobenzaprine (FLEXERIL) 10 MG tablet Take 1 tablet (10 mg total) by mouth 3 (three) times daily as needed for muscle spasms. 11/27/16   Reymundo Pollarolyn Guilloud, MD  famotidine (PEPCID) 20 MG tablet Take 1 tablet (20 mg total) by mouth 2 (two) times daily. 11/22/16 12/22/16  Alm BustardMatthew O'Sullivan, MD  metoCLOPramide (REGLAN) 10 MG tablet Take 1 tablet (10 mg total) by mouth every 8 (eight) hours as needed for nausea. 11/29/16  12/04/16  Jeanie SewerMina A Fawze, PA  naproxen (NAPROSYN) 500 MG tablet Take 1.5 tablets (750 mg total) by mouth 3 (three) times daily with meals. 11/27/16 11/27/17  Reymundo Pollarolyn Guilloud, MD  promethazine (PHENERGAN) 12.5 MG tablet Take 1 tablet (12.5 mg total) by mouth every 6 (six) hours as needed for nausea or vomiting. 11/24/16   Althia FortsAdam Johnson, MD    Family History Family History  Problem Relation Age of Onset  . Hypertension Mother   . Hypertension Father     Social History Social History  Substance Use Topics  . Smoking status: Never Smoker  . Smokeless tobacco: Never Used  . Alcohol use Yes     Comment: 11/21/2016 "last drank 06/2016"     Allergies   Motrin [ibuprofen] and Zofran [ondansetron hcl]   Review of Systems Review of Systems  Constitutional: Negative for chills and fever.  HENT: Negative for congestion, postnasal drip, rhinorrhea, sinus pain, sinus pressure, sneezing and sore throat.   Respiratory: Negative for chest tightness.   Cardiovascular: Negative for chest pain and leg swelling.  Gastrointestinal: Positive for abdominal pain and nausea. Negative for abdominal distention, blood in stool, diarrhea and vomiting.  Genitourinary: Negative for dysuria, flank pain and hematuria.  Musculoskeletal: Positive for neck pain.       Chronic, unchanged  Neurological: Negative for dizziness, syncope, weakness and light-headedness.  Psychiatric/Behavioral:  Negative for agitation and confusion. The patient is nervous/anxious.      Physical Exam Updated Vital Signs BP 109/69 (BP Location: Right Arm)   Pulse 84   Temp 98.5 F (36.9 C) (Oral)   Resp 16   Ht 5\' 6"  (1.676 m)   Wt 85.3 kg   SpO2 98%   BMI 30.34 kg/m   Physical Exam  Constitutional: He is oriented to person, place, and time. He appears well-developed and well-nourished. No distress.  HENT:  Head: Normocephalic and atraumatic.  Mouth/Throat: Oropharynx is clear and moist. No oropharyngeal exudate.  Eyes: Right  eye exhibits no discharge. Left eye exhibits no discharge. No scleral icterus.  Right eye pupil nonreactive to light, elliptical shaped pupil; pt states this is secondary to an eye injury and unchanged.   Neck: Normal range of motion. No JVD present. No tracheal deviation present.  Cardiovascular: Normal rate, regular rhythm, normal heart sounds and intact distal pulses.   Pulmonary/Chest: Effort normal and breath sounds normal.  Abdominal: Soft. Bowel sounds are normal. There is tenderness.  Mild ttp of epigastrium, negative Murphy's, no ttp at Mcburney's point, negative Rovsing's.   Musculoskeletal: Normal range of motion.  Lymphadenopathy:    He has no cervical adenopathy.  Neurological: He is alert and oriented to person, place, and time. No cranial nerve deficit.  Skin: Skin is warm and dry. He is not diaphoretic.     ED Treatments / Results  Labs (all labs ordered are listed, but only abnormal results are displayed) Labs Reviewed - No data to display  EKG  EKG Interpretation None       Radiology No results found.  Procedures Procedures (including critical care time)  Medications Ordered in ED Medications  metoCLOPramide (REGLAN) tablet 10 mg (10 mg Oral Given 11/29/16 1106)     Initial Impression / Assessment and Plan / ED Course  I have reviewed the triage vital signs and the nursing notes.  Pertinent labs & imaging results that were available during my care of the patient were reviewed by me and considered in my medical decision making (see chart for details).     50yom presents with chief complaint nausea with associated epigastric pain that began this morning. Pt afebrile, VS baseline. No CP/SOB. Low clinical suspicion of PE, ACS, bowel obstruction, or acute abdominal pathology. Pt has had multiple negative abdominal workups recently and endorses ongoing anxiety after falling off a roof last year, and states his nausea is often associated with anxiety. Physical  exam positive only for mild ttp to epigastrium. Pt provided with PO Reglan (which he tolerated well) and encouraged PO intake while in ED with significant improvement. Pt is resting comfortably, tolerating PO, and ambulatory at this time. Will discharge with rx for Reglan which was helpful to him. We discussed the utility of establishing care for management of his anxiety, and he is also interested in seeing a neurologist for his chronic neck pain and tingling of his head. Discussed ED precautions for return.   Final Clinical Impressions(s) / ED Diagnoses   Final diagnoses:  Nausea without vomiting  Neck pain, chronic    New Prescriptions Discharge Medication List as of 11/29/2016 12:18 PM    START taking these medications   Details  metoCLOPramide (REGLAN) 10 MG tablet Take 1 tablet (10 mg total) by mouth every 8 (eight) hours as needed for nausea., Starting Wed 11/29/2016, Until Mon 12/04/2016, Print         Jeanie Sewer, [place]  11/29/16 1603    Benjiman Core, MD 12/02/16 0020

## 2016-12-01 ENCOUNTER — Ambulatory Visit (INDEPENDENT_AMBULATORY_CARE_PROVIDER_SITE_OTHER): Payer: Self-pay | Admitting: Internal Medicine

## 2016-12-01 ENCOUNTER — Emergency Department (HOSPITAL_COMMUNITY)
Admission: EM | Admit: 2016-12-01 | Discharge: 2016-12-01 | Disposition: A | Discharge status: Home / Self Care | Payer: Self-pay

## 2016-12-01 VITALS — BP 136/92 | HR 77 | Temp 98.3°F | Ht 66.0 in | Wt 186.9 lb

## 2016-12-01 DIAGNOSIS — R110 Nausea: Secondary | ICD-10-CM

## 2016-12-01 DIAGNOSIS — R1013 Epigastric pain: Secondary | ICD-10-CM

## 2016-12-01 DIAGNOSIS — F419 Anxiety disorder, unspecified: Secondary | ICD-10-CM

## 2016-12-01 DIAGNOSIS — M542 Cervicalgia: Secondary | ICD-10-CM

## 2016-12-01 MED ORDER — MELOXICAM 15 MG PO TABS: 15.0000 mg | ORAL_TABLET | Freq: Every day | ORAL | 0 refills | Status: DC

## 2016-12-01 MED ORDER — ALPRAZOLAM 0.5 MG PO TABS: 0.5000 mg | ORAL_TABLET | Freq: Three times a day (TID) | ORAL | 0 refills | Status: DC | PRN

## 2016-12-01 MED ORDER — OMEPRAZOLE 20 MG PO TBEC: 20.0000 mg | DELAYED_RELEASE_TABLET | Freq: Every day | ORAL | 0 refills | Status: DC

## 2016-12-01 MED ORDER — MELOXICAM 10 MG PO CAPS: 10.0000 mg | ORAL_CAPSULE | Freq: Every day | ORAL | 0 refills | Status: DC | PRN

## 2016-12-01 NOTE — ED Notes (Signed)
Pt left from triage room, without being triage stated "I need fresh air" Triage nurse was notified.

## 2016-12-01 NOTE — Patient Instructions (Signed)
It was a pleasure to see you today Wesley [name].  1) -I think a lot of your symptoms are coming from an anxiety disorder today. This can lead to a lot of stress and even physical symptoms especially headaches, muscle tension, indigestion, chest pain, or shortness of breath.  -I prescribed a course of Xanax (alprazolam) 0.5 mg you can take no more than 3 times daily as needed if you are feeling severe anxiety over these other associated symptoms getting worse.  -Additionally, I would recommend [facility] to be evaluated at least once during this time. They may have more specific recommendations for preventing her symptoms.  2) You may also be having primary symptoms due to acid reflux causing some stomach/chest pain and indigestion. I recommend starting the omeprazole (Prilosec) 20 mg once daily to control the symptoms.  3) Your neck and back pain seems very much like muscle tension because it is very sore on my exam today and so I recommended taking an anti-inflammatory medicine once daily and have prescribed meloxicam (Mobitz) (10 mg once daily as needed for back and neck pain.   I would like to see how you're doing in a few weeks with this treatment.

## 2016-12-01 NOTE — Progress Notes (Signed)
   CC: Anxiety, nausea, back pain  HPI:  [name] is a 51 y.o. man here for follow up after recently starting medication for his episodic anxiety and nausea that has not improved after starting new medications earlier this month.   See problem based assessment and plan below for additional details  Past Medical History:  Diagnosis Date  . Alcohol abuse   . Anxiety   . Chronic neck pain   . Cocaine abuse     Review of Systems:  Review of Systems  Respiratory: Positive for shortness of breath.   Cardiovascular: Negative for chest pain and leg swelling.  Gastrointestinal: Positive for abdominal pain, heartburn and nausea.  Musculoskeletal: Positive for back pain, myalgias and neck pain. Negative for falls.  Neurological: Positive for dizziness. Negative for weakness.  Psychiatric/Behavioral: Negative for depression, substance abuse and suicidal ideas. The patient is nervous/anxious and has insomnia.     Physical Exam: Physical Exam  Constitutional: He is well-developed, well-nourished, and in no distress.  HENT:  Head: Normocephalic and atraumatic.  Eyes: Conjunctivae are normal.  Neck: Normal range of motion. Neck supple.  Cardiovascular: Normal rate and regular rhythm.   Pulmonary/Chest: Effort normal and breath sounds normal.  Musculoskeletal: Normal range of motion. He exhibits no edema.  Neurological: He is alert. Gait normal.  Skin: Skin is warm and dry. No rash noted.  Psychiatric: Memory and affect normal.    Vitals:   12/01/16 1336  BP: (!) 136/92  Pulse: 77  Temp: 98.3 F (36.8 C)  TempSrc: Oral  SpO2: 100%  Weight: 186 lb 14.4 oz (84.8 kg)  Height: 5\' 6"  (1.676 m)    Assessment & Plan:   See Encounters Tab for problem based charting.  Patient discussed with Dr. Cyndie ChimeGranfortuna

## 2016-12-02 ENCOUNTER — Emergency Department (HOSPITAL_COMMUNITY)
Admission: EM | Admit: 2016-12-02 | Discharge: 2016-12-02 | Disposition: A | Discharge status: Home / Self Care | Payer: Self-pay | Attending: Emergency Medicine | Admitting: Emergency Medicine

## 2016-12-02 ENCOUNTER — Encounter (HOSPITAL_COMMUNITY): Payer: Self-pay | Admitting: *Deleted

## 2016-12-02 DIAGNOSIS — M62830 Muscle spasm of back: Secondary | ICD-10-CM

## 2016-12-02 DIAGNOSIS — M546 Pain in thoracic spine: Secondary | ICD-10-CM | POA: Insufficient documentation

## 2016-12-02 DIAGNOSIS — R110 Nausea: Secondary | ICD-10-CM

## 2016-12-02 DIAGNOSIS — G8929 Other chronic pain: Secondary | ICD-10-CM

## 2016-12-02 DIAGNOSIS — R10816 Epigastric abdominal tenderness: Secondary | ICD-10-CM | POA: Insufficient documentation

## 2016-12-02 DIAGNOSIS — M542 Cervicalgia: Secondary | ICD-10-CM | POA: Insufficient documentation

## 2016-12-02 DIAGNOSIS — M545 Low back pain: Secondary | ICD-10-CM

## 2016-12-02 MED ORDER — KETOROLAC TROMETHAMINE 60 MG/2ML IM SOLN
30.0000 mg | Freq: Once | INTRAMUSCULAR | Status: DC
  Filled 2016-12-02: qty 2

## 2016-12-02 MED ORDER — MELOXICAM 15 MG PO TABS: 15.0000 mg | ORAL_TABLET | Freq: Every day | ORAL | 0 refills | Status: DC

## 2016-12-02 MED ORDER — METHOCARBAMOL 500 MG PO TABS: 500.0000 mg | ORAL_TABLET | Freq: Two times a day (BID) | ORAL | 0 refills | Status: DC

## 2016-12-02 MED ORDER — METOCLOPRAMIDE HCL 10 MG PO TABS
10.0000 mg | ORAL_TABLET | Freq: Once | ORAL | Status: DC
  Filled 2016-12-02: qty 1

## 2016-12-02 MED ORDER — METHOCARBAMOL 500 MG PO TABS: 500.0000 mg | ORAL_TABLET | Freq: Once | ORAL | Status: DC

## 2016-12-02 NOTE — ED Triage Notes (Signed)
The pt is c/o back pain since yesterday  He has an old back injury that flares up

## 2016-12-02 NOTE — ED Provider Notes (Signed)
[facility] DEPT Provider Note   CSN: [id number] Arrival date & time: 12/02/16  0444     History   Chief Complaint Chief Complaint  Patient presents with  . Back Pain    HPI Wesley [name] is a 51 y.o. male with history of chronic neck and back pain after accident last year, cocaine and alcohol abuse which patient reports he has stopped, and anxiety who presents with back and neck pain and nausea. Patient reports he has had spasms in his entire back that began yesterday. Patient has associated intermittent tingling in bilateral hands. He denies any numbness or tingling in his lower extremities, but has had associated pain radiating down his legs. He denies any saddle anesthesia or bowel or bladder incontinence. He denies any fevers, history of IVDU, surgeries or procedures to his back, or cancer. Patient has had associated nausea, worse with his pain. Patient states he saw an internal medicine doctor yesterday for her anxiety who gave him Xanax and prescribed Mobic. Patient reports the drugstore did not have the Mobic. Patient has not taken any other medications at home for his back pain. Patient denies any recent trauma or injury. Patient fell off a roof about a year ago this had intermittent pain since.  HPI  Past Medical History:  Diagnosis Date  . Alcohol abuse   . Anxiety   . Chronic neck pain   . Cocaine abuse     Patient Active Problem List   Diagnosis Date Noted  . Back pain 11/27/2016  . Bilateral posterior neck pain 11/27/2016  . Epigastric abdominal pain 11/21/2016  . T wave inversion in EKG 11/21/2016  . Nausea 11/21/2016  . Anxiety 11/21/2016    Past Surgical History:  Procedure Laterality Date  . EYE SURGERY Right ~ 1980   "hit my eye"       Home Medications    Prior to Admission medications   Medication Sig Start Date End Date Taking? Authorizing Provider  ALPRAZolam Prudy Feeler) 0.5 MG tablet Take 1 tablet (0.5 mg total) by mouth every 8 (eight) hours as  needed for anxiety. 12/01/16   Fuller Plan, MD  meloxicam (MOBIC) 15 MG tablet Take 1 tablet (15 mg total) by mouth daily. 12/02/16   Emi Holes, PA-C  methocarbamol (ROBAXIN) 500 MG tablet Take 1 tablet (500 mg total) by mouth 2 (two) times daily. 12/02/16   Emi Holes, PA-C  metoCLOPramide (REGLAN) 10 MG tablet Take 1 tablet (10 mg total) by mouth every 8 (eight) hours as needed for nausea. 11/29/16 12/04/16  Jeanie Sewer, PA  Omeprazole 20 MG TBEC Take 1 tablet (20 mg total) by mouth daily. 12/01/16   Fuller Plan, MD    Family History Family History  Problem Relation Age of Onset  . Hypertension Mother   . Hypertension Father     Social History Social History  Substance Use Topics  . Smoking status: Never Smoker  . Smokeless tobacco: Never Used  . Alcohol use Yes     Comment: 11/21/2016 "last drank 06/2016"     Allergies   Motrin [ibuprofen] and Zofran [ondansetron hcl]   Review of Systems Review of Systems  Constitutional: Negative for chills and fever.  Respiratory: Negative for shortness of breath.   Cardiovascular: Negative for chest pain.  Gastrointestinal: Positive for nausea. Negative for abdominal pain and vomiting.  Musculoskeletal: Positive for back pain and neck pain.  Skin: Negative for rash and wound.  Neurological: Negative for numbness.  Physical Exam Updated Vital Signs BP 117/78   Pulse 67   Resp 17   Ht 5\' 6"  (1.676 m)   Wt 84.9 kg   SpO2 96%   BMI 30.21 kg/m   Physical Exam  Constitutional: He appears well-developed and well-nourished. No distress.  HENT:  Head: Normocephalic and atraumatic.  Mouth/Throat: Oropharynx is clear and moist. No oropharyngeal exudate.  Eyes: Conjunctivae are normal. Pupils are equal, round, and reactive to light. Right eye exhibits no discharge. Left eye exhibits no discharge. No scleral icterus.  Neck: Normal range of motion. Neck supple. No thyromegaly present.  Cardiovascular: Normal  rate, regular rhythm, normal heart sounds and intact distal pulses.  Exam reveals no gallop and no friction rub.   No murmur heard. Pulmonary/Chest: Effort normal and breath sounds normal. No stridor. No respiratory distress. He has no wheezes. He has no rales.  Abdominal: Soft. Bowel sounds are normal. He exhibits no distension. There is tenderness. There is no rebound and no guarding.  Mild epigastric tenderness  Musculoskeletal: He exhibits no edema.       Cervical back: He exhibits tenderness and bony tenderness.       Thoracic back: He exhibits tenderness and bony tenderness.       Lumbar back: He exhibits tenderness, bony tenderness and deformity (tenderness at deformity of lumbar spine, could be chronic).  Lymphadenopathy:    He has no cervical adenopathy.  Neurological: He is alert. Coordination normal.  5/5 strength to all 4 extremities, equal bilateral grip strength, normal sensation  Skin: Skin is warm and dry. No rash noted. He is not diaphoretic. No pallor.  Psychiatric: He has a normal mood and affect.  Nursing note and vitals reviewed.    ED Treatments / Results  Labs (all labs ordered are listed, but only abnormal results are displayed) Labs Reviewed - No data to display  EKG  EKG Interpretation None       Radiology No results found.  Procedures Procedures (including critical care time)  Medications Ordered in ED Medications - No data to display   Initial Impression / Assessment and Plan / ED Course  I have reviewed the triage vital signs and the nursing notes.  Pertinent labs & imaging results that were available during my care of the patient were reviewed by me and considered in my medical decision making (see chart for details).     Patient with back pain.  No neurological deficits and normal neuro exam.  Patient is ambulatory.  No loss of bowel or bladder control.  No concern for cauda equina.  No fever, night sweats, weight loss, h/o cancer, IVDA,  no recent procedure to back. No urinary symptoms suggestive of UTI.  Patient offered imaging, but declined. Patient offered medication ED, however would just like prescriptions for home. Patient states he took one of his prescribed Xanax in the ED which helped his muscle pain and nausea. We'll discharge home with Robaxin and Mobic, as patient states he was not able to fill his prescription prior. Patient given follow-up to neurosurgery, per request, for radicular symptoms. Follow up to PCP as well. Supportive care, including ice, heat, back exercises, and return precaution discussed. Patient vitals stable throughout ED course and discharged in satisfactory condition.   Final Clinical Impressions(s) / ED Diagnoses   Final diagnoses:  Chronic neck pain  Chronic bilateral thoracic back pain  Chronic bilateral low back pain, with sciatica presence unspecified  Muscle spasm of back  Nausea  New Prescriptions New Prescriptions   METHOCARBAMOL (ROBAXIN) 500 MG TABLET    Take 1 tablet (500 mg total) by mouth 2 (two) times daily.     [address] RoadAlexandra M Law, PA-C 12/02/16 [id number]    Zadie Rhineonald Wickline, MD 12/03/16 [phone]

## 2016-12-02 NOTE — ED Notes (Signed)
Pt refused xrays

## 2016-12-02 NOTE — Discharge Instructions (Signed)
Medications: Robaxin, meloxicam  Treatment: Take Robaxin twice daily as needed for muscle pain and spasms. Do not drive or operate machinery when taking this medication. Take meloxicam once daily as prescribed. Use ice and heat 3-4 times daily alternating 20 minutes on, 20 minutes off. Attempt the back exercises and stretches attached as tolerated.  Follow-up: Please follow-up with your primary care provider as needed for follow-up and further evaluation. You can follow up with [facility] for further evaluation and treatment of your back and neck pain. Please return to emergency department if you develop any new or worsening symptoms.

## 2016-12-02 NOTE — ED Notes (Signed)
ED Provider at bedside. 

## 2016-12-02 NOTE — ED Notes (Signed)
Pt reports taking 0.5mg  of xanax approx 30 minutes ago. States that that helped his nausea. Pt states that he just wants prescriptions and none of the ordered medications at this time.

## 2016-12-04 NOTE — Progress Notes (Signed)
Medicine attending: Medical history, presenting problems, physical findings, and medications, reviewed with resident physician Dr Christopher Rice on the day of the patient visit and I concur with his evaluation and management plan. 

## 2016-12-04 NOTE — Assessment & Plan Note (Signed)
HPI: He has continued nausea intermittently. He stopped taking phenergan since he was prescribed reglan and thinks this is more effective. He does also report heartburn or indigestion type symptoms that worsen lying flat at night.  A: Persistent, intermittent nausea. There may be underlying GERD with this nausea and indigestion.  P: Prescribed omeprazole 20mg  PO daily for GERD

## 2016-12-04 NOTE — Assessment & Plan Note (Signed)
HPI: He continues to have pain in his neck, upper back, and shoulders. This pain is waxing and waning but appears to be bothering him most days. He started taking flexeril previously but this medication makes him excessively lightheaded or sleepy so he stopped taking it. He is taking naproxen more frequently than prescribed due to his chronic pain.  A: Musculoskeletal neck and back pain This pain is is extremely reproducible with direct palpation. I suspect he has tension headache as well and this problem is exacerbated by his chronic anxiety. There are no neurological deficits, weakness, or radicular symptoms.  P: Recommend decrease current overuse of naproxen Prescribed meloxicam 15mg  as a once a day medication F/U in 2-3 wks

## 2016-12-04 NOTE — Assessment & Plan Note (Signed)
HPI: Mr. Wesley [name] continues to experience a high level of stress and anxiety which he refers to as a "nervousness" problem.  He has never had any electrographic or enzyme abnormalities during his 30+ ED and hospital evaluations in the past 5 months for these events. He also describes frequent insomnia.  A: Anxiety, possibly panic disorder I suspect his episodes of shortness of breath, muscle cramping, chest and abdominal pain are related to somatic manifestations of panic attacks. In the long term starting a maintenance medication would be beneficial I think a trial of short acting benzodiazepines for anxiolytics might be both helpful and informative if it greatly relieves these somatic symptoms. I think he would benefit greatly from a psychiatric evaluation at some point. Without insurance his options for referral are limited so we can provide information about [facility] today.  P: -Alprazolam 0.5mg  up to once every 8 hours as needed #30 tablet provided -Discontinued celexa from record at this time as he is not taking it at all with reportedly feeling lightheaded -[facility] and information provided today -Plan follow up in 2-3 weeks

## 2016-12-05 ENCOUNTER — Encounter (HOSPITAL_COMMUNITY): Payer: Self-pay | Admitting: Emergency Medicine

## 2016-12-05 ENCOUNTER — Other Ambulatory Visit: Payer: Self-pay

## 2016-12-05 ENCOUNTER — Emergency Department (HOSPITAL_COMMUNITY)
Admission: EM | Admit: 2016-12-05 | Discharge: 2016-12-06 | Disposition: A | Discharge status: Home / Self Care | Payer: Self-pay | Attending: Dermatology | Admitting: Dermatology

## 2016-12-05 ENCOUNTER — Emergency Department (HOSPITAL_COMMUNITY)
Admission: EM | Admit: 2016-12-05 | Discharge: 2016-12-05 | Disposition: A | Discharge status: Home / Self Care | Payer: Self-pay | Attending: Emergency Medicine | Admitting: Emergency Medicine

## 2016-12-05 DIAGNOSIS — R42 Dizziness and giddiness: Secondary | ICD-10-CM | POA: Insufficient documentation

## 2016-12-05 DIAGNOSIS — Z79899 Other long term (current) drug therapy: Secondary | ICD-10-CM | POA: Insufficient documentation

## 2016-12-05 DIAGNOSIS — M62838 Other muscle spasm: Secondary | ICD-10-CM | POA: Insufficient documentation

## 2016-12-05 DIAGNOSIS — Z5321 Procedure and treatment not carried out due to patient leaving prior to being seen by health care provider: Secondary | ICD-10-CM | POA: Insufficient documentation

## 2016-12-05 LAB — CBC
HCT: 43.3 % (ref 39.0–52.0)
Hemoglobin: 14.3 g/dL (ref 13.0–17.0)
MCH: 28.1 pg (ref 26.0–34.0)
MCHC: 33 g/dL (ref 30.0–36.0)
MCV: 85.1 fL (ref 78.0–100.0)
Platelets: 256 10*3/uL (ref 150–400)
RBC: 5.09 MIL/uL (ref 4.22–5.81)
RDW: 13.4 % (ref 11.5–15.5)
WBC: 6.3 10*3/uL (ref 4.0–10.5)

## 2016-12-05 LAB — I-STAT TROPONIN, ED: Troponin i, poc: 0 ng/mL (ref 0.00–0.08)

## 2016-12-05 LAB — BASIC METABOLIC PANEL
Anion gap: 9 (ref 5–15)
BUN: 9 mg/dL (ref 6–20)
CO2: 26 mmol/L (ref 22–32)
Calcium: 9.5 mg/dL (ref 8.9–10.3)
Chloride: 102 mmol/L (ref 101–111)
Creatinine, Ser: 0.72 mg/dL (ref 0.61–1.24)
GFR calc Af Amer: >60 mL/min (ref >60)
GFR calc non Af Amer: >60 mL/min (ref >60)
Glucose, Bld: 98 mg/dL (ref 65–99)
Potassium: 3.9 mmol/L (ref 3.5–5.1)
Sodium: 137 mmol/L (ref 135–145)

## 2016-12-05 MED ORDER — BACLOFEN 10 MG PO TABS: 10.0000 mg | ORAL_TABLET | Freq: Three times a day (TID) | ORAL | 0 refills | Status: DC

## 2016-12-05 NOTE — Discharge Instructions (Signed)
Return if you develop any new weakness or numbness in the arms.

## 2016-12-05 NOTE — ED Notes (Signed)
Called patient for room x 2, no answer.  

## 2016-12-05 NOTE — ED Provider Notes (Signed)
[facility] Provider Note   CSN: [id number] Arrival date & time: 12/05/16  0431     History   Chief Complaint Chief Complaint  Patient presents with  . Neck Pain    pt reports was working in the attic of his house and woke up tonight having neck spasms "nervousness" and headache. pt denies injury    HPI Wesley [name] is a 51 y.o. male c/o neck pain and headache. He was cleaning out his attic yesterday and c/o Bilateral trapezius muscle pain and neck pain. Patient denies numbness, tingling or weakness in the bilateral upper extremities. Patient was given a prescription for Mobic 2 days ago, but states that the pharmacist told him it was for "bone pain" and decided that he should not take it because he doesn't have bone pain. Patient also has a prescription for Robaxin. He took one of his pills and felt that it made him feel short of breath and has not taken it again.  HPI  Past Medical History:  Diagnosis Date  . Alcohol abuse   . Anxiety   . Chronic neck pain   . Cocaine abuse     Patient Active Problem List   Diagnosis Date Noted  . Back pain 11/27/2016  . Bilateral posterior neck pain 11/27/2016  . Epigastric abdominal pain 11/21/2016  . T wave inversion in EKG 11/21/2016  . Nausea 11/21/2016  . Anxiety 11/21/2016    Past Surgical History:  Procedure Laterality Date  . EYE SURGERY Right ~ 1980   "hit my eye"       Home Medications    Prior to Admission medications   Medication Sig Start Date End Date Taking? Authorizing Provider  ALPRAZolam Prudy Feeler) 0.5 MG tablet Take 1 tablet (0.5 mg total) by mouth every 8 (eight) hours as needed for anxiety. 12/01/16   Fuller Plan, MD  meloxicam (MOBIC) 15 MG tablet Take 1 tablet (15 mg total) by mouth daily. 12/02/16   Emi Holes, PA-C  methocarbamol (ROBAXIN) 500 MG tablet Take 1 tablet (500 mg total) by mouth 2 (two) times daily. 12/02/16   Emi Holes, PA-C  metoCLOPramide (REGLAN) 10 MG tablet Take 1  tablet (10 mg total) by mouth every 8 (eight) hours as needed for nausea. 11/29/16 12/04/16  Jeanie Sewer, PA  Omeprazole 20 MG TBEC Take 1 tablet (20 mg total) by mouth daily. 12/01/16   Fuller Plan, MD    Family History Family History  Problem Relation Age of Onset  . Hypertension Mother   . Hypertension Father     Social History Social History  Substance Use Topics  . Smoking status: Never Smoker  . Smokeless tobacco: Never Used  . Alcohol use Yes     Comment: 11/21/2016 "last drank 06/2016"     Allergies   Motrin [ibuprofen] and Zofran [ondansetron hcl]   Review of Systems Review of Systems  Ten systems reviewed and are negative for acute change, except as noted in the HPI.   Physical Exam Updated Vital Signs BP (!) 127/98 (BP Location: Right Arm)   Pulse 80   Temp 97.6 F (36.4 C) (Oral)   Resp 20   SpO2 100%   Physical Exam  Constitutional: He appears well-developed and well-nourished. No distress.  HENT:  Head: Normocephalic and atraumatic.  Eyes: Conjunctivae are normal. No scleral icterus.  Neck: Neck supple. Muscular tenderness present. No spinous process tenderness present. No neck rigidity. Decreased range of motion present. No edema and  no erythema present.    Cardiovascular: Normal rate, regular rhythm and normal heart sounds.   Pulmonary/Chest: Effort normal and breath sounds normal. No respiratory distress.  Abdominal: Soft. There is no tenderness.  Musculoskeletal: He exhibits no edema.  Neurological: He is alert.  Skin: Skin is warm and dry. He is not diaphoretic.  Psychiatric: His behavior is normal.  Nursing note and vitals reviewed.    ED Treatments / Results  Labs (all labs ordered are listed, but only abnormal results are displayed) Labs Reviewed - No data to display  EKG  EKG Interpretation None       Radiology No results found.  Procedures Procedures (including critical care time)  Medications Ordered in  ED Medications - No data to display   Initial Impression / Assessment and Plan / ED Course  I have reviewed the triage vital signs and the nursing notes.  Pertinent labs & imaging results that were available during my care of the patient were reviewed by me and considered in my medical decision making (see chart for details).     Decreased range of motion due to neck muscle tightness. Active trigger points in the bilateral trapezius muscle reproduce the patient's pain around the scalp and headache.  Musculoskeletal in nature. Bilateral upper extremities are equal in strength with bilateral equal grip strength and sensation. Patient will be discharged to follow-up with his PCP. I will change the patient's Robaxin prescription to baclofen. He appears safe for discharge at this time.  Final Clinical Impressions(s) / ED Diagnoses   Final diagnoses:  None    New Prescriptions New Prescriptions   No medications on file     Arthor Captainbigail Harris, PA-C 12/05/16 0546    Glynn OctaveStephen Rancour, MD 12/05/16 [phone]

## 2016-12-05 NOTE — ED Triage Notes (Signed)
Patient c/o dizziness x 2h. Seen here 2 days ago, given Xanax and Baclofen. Patient also says he has anxiety but not feeling anxious so didn't take xanax

## 2016-12-06 NOTE — ED Notes (Signed)
Called pt x3 for vitals. No response. 

## 2016-12-07 ENCOUNTER — Encounter (HOSPITAL_COMMUNITY): Payer: Self-pay | Admitting: Emergency Medicine

## 2016-12-07 ENCOUNTER — Emergency Department (HOSPITAL_COMMUNITY)
Admission: EM | Admit: 2016-12-07 | Discharge: 2016-12-07 | Disposition: A | Discharge status: Home / Self Care | Payer: Self-pay | Attending: Dermatology | Admitting: Dermatology

## 2016-12-07 DIAGNOSIS — G8929 Other chronic pain: Secondary | ICD-10-CM | POA: Insufficient documentation

## 2016-12-07 DIAGNOSIS — Z5321 Procedure and treatment not carried out due to patient leaving prior to being seen by health care provider: Secondary | ICD-10-CM | POA: Insufficient documentation

## 2016-12-07 DIAGNOSIS — M549 Dorsalgia, unspecified: Secondary | ICD-10-CM | POA: Insufficient documentation

## 2016-12-07 NOTE — ED Triage Notes (Signed)
Pt complaining of "something" sensation of discomfort from belly up to neck while laying down. C/o chronic back pain. No abdominal pain, neck pain, chest pain. States has an Advertising account executiveX for xanax but didn't try it today.

## 2016-12-07 NOTE — ED Triage Notes (Signed)
Called for triage x1.

## 2016-12-07 NOTE — ED Notes (Signed)
Pt did not respond when called for room x 2

## 2016-12-12 ENCOUNTER — Encounter (HOSPITAL_COMMUNITY): Payer: Self-pay | Admitting: Emergency Medicine

## 2016-12-12 ENCOUNTER — Emergency Department (HOSPITAL_COMMUNITY)
Admission: EM | Admit: 2016-12-12 | Discharge: 2016-12-12 | Disposition: A | Discharge status: Home / Self Care | Payer: Self-pay | Attending: Emergency Medicine | Admitting: Emergency Medicine

## 2016-12-12 ENCOUNTER — Emergency Department (HOSPITAL_COMMUNITY): Payer: Self-pay

## 2016-12-12 DIAGNOSIS — Z79899 Other long term (current) drug therapy: Secondary | ICD-10-CM | POA: Insufficient documentation

## 2016-12-12 DIAGNOSIS — F419 Anxiety disorder, unspecified: Secondary | ICD-10-CM | POA: Insufficient documentation

## 2016-12-12 DIAGNOSIS — F410 Panic disorder [episodic paroxysmal anxiety] without agoraphobia: Secondary | ICD-10-CM

## 2016-12-12 LAB — I-STAT TROPONIN, ED: Troponin i, poc: 0 ng/mL (ref 0.00–0.08)

## 2016-12-12 LAB — CBC WITH DIFFERENTIAL/PLATELET
Basophils Absolute: 0 10*3/uL (ref 0.0–0.1)
Basophils Relative: 0 %
Eosinophils Absolute: 0.3 10*3/uL (ref 0.0–0.7)
Eosinophils Relative: 3 %
HCT: 43.9 % (ref 39.0–52.0)
Hemoglobin: 14.9 g/dL (ref 13.0–17.0)
Lymphocytes Relative: 57 %
Lymphs Abs: 5 10*3/uL — ABNORMAL HIGH (ref 0.7–4.0)
MCH: 28.9 pg (ref 26.0–34.0)
MCHC: 33.9 g/dL (ref 30.0–36.0)
MCV: 85.1 fL (ref 78.0–100.0)
Monocytes Absolute: 0.5 10*3/uL (ref 0.1–1.0)
Monocytes Relative: 5 %
Neutro Abs: 3.1 10*3/uL (ref 1.7–7.7)
Neutrophils Relative %: 35 %
Platelets: 289 10*3/uL (ref 150–400)
RBC: 5.16 MIL/uL (ref 4.22–5.81)
RDW: 12.8 % (ref 11.5–15.5)
WBC: 8.9 10*3/uL (ref 4.0–10.5)

## 2016-12-12 LAB — BASIC METABOLIC PANEL
Anion gap: 14 (ref 5–15)
BUN: 10 mg/dL (ref 6–20)
CO2: 25 mmol/L (ref 22–32)
Calcium: 9.6 mg/dL (ref 8.9–10.3)
Chloride: 100 mmol/L — ABNORMAL LOW (ref 101–111)
Creatinine, Ser: 0.81 mg/dL (ref 0.61–1.24)
GFR calc Af Amer: >60 mL/min (ref >60)
GFR calc non Af Amer: >60 mL/min (ref >60)
Glucose, Bld: 108 mg/dL — ABNORMAL HIGH (ref 65–99)
Potassium: 4.1 mmol/L (ref 3.5–5.1)
Sodium: 139 mmol/L (ref 135–145)

## 2016-12-12 LAB — D-DIMER, QUANTITATIVE: D-Dimer, Quant: <0.27 ug/mL-FEU (ref 0.00–0.50)

## 2016-12-12 MED ORDER — PROMETHAZINE HCL 25 MG/ML IJ SOLN
25.0000 mg | Freq: Once | INTRAMUSCULAR | Status: AC
  Administered 2016-12-12: 25 mg via INTRAVENOUS
  Filled 2016-12-12: qty 1

## 2016-12-12 MED ORDER — SODIUM CHLORIDE 0.9 % IV BOLUS (SEPSIS)
1000.0000 mL | Freq: Once | INTRAVENOUS | Status: AC
  Administered 2016-12-12: 1000 mL via INTRAVENOUS

## 2016-12-12 NOTE — ED Triage Notes (Signed)
Patient woke up this evening with SOB and anxiety , denies chest pain , no cough or congestion, he took Alprazolam prior to arrival with no relief. Denies fever or chills .

## 2016-12-12 NOTE — ED Provider Notes (Signed)
[facility] Provider Note   CSN: [id number] Arrival date & time: 12/12/16  0159     History   Chief Complaint Chief Complaint  Patient presents with  . Shortness of Breath    HPI Wesley [name] is a 51 y.o. male.  Patient presents to the emergency department for evaluation of shortness of breath. Patient reports that he was awakened from sleep at approximately 1:30 with shortness of breath. He reports that his heart was racing in the muscles in his back for spasming. His mouth is dry and he felt very anxious. He does have a history of anxiety, prescribed Xanax. He took a Xanax but only had partial relief. He still feels slightly short of breath and anxious at arrival to the ER. He is not experiencing any chest pain.      Past Medical History:  Diagnosis Date  . Alcohol abuse   . Anxiety   . Chronic neck pain   . Cocaine abuse     Patient Active Problem List   Diagnosis Date Noted  . Back pain 11/27/2016  . Bilateral posterior neck pain 11/27/2016  . Epigastric abdominal pain 11/21/2016  . T wave inversion in EKG 11/21/2016  . Nausea 11/21/2016  . Anxiety 11/21/2016    Past Surgical History:  Procedure Laterality Date  . EYE SURGERY Right ~ 1980   "hit my eye"       Home Medications    Prior to Admission medications   Medication Sig Start Date End Date Taking? Authorizing Provider  ALPRAZolam Prudy Feeler) 0.5 MG tablet Take 1 tablet (0.5 mg total) by mouth every 8 (eight) hours as needed for anxiety. 12/01/16  Yes Fuller Plan, MD  baclofen (LIORESAL) 10 MG tablet Take 1 tablet (10 mg total) by mouth 3 (three) times daily. Patient not taking: Reported on 12/12/2016 12/05/16   Arthor Captain, PA-C  meloxicam (MOBIC) 15 MG tablet Take 1 tablet (15 mg total) by mouth daily. Patient not taking: Reported on 12/12/2016 12/02/16   Emi Holes, PA-C  metoCLOPramide (REGLAN) 10 MG tablet Take 1 tablet (10 mg total) by mouth every 8 (eight) hours as needed for  nausea. Patient not taking: Reported on 12/12/2016 11/29/16 12/05/16  Mina A Fawze, PA-C  Omeprazole 20 MG TBEC Take 1 tablet (20 mg total) by mouth daily. Patient not taking: Reported on 12/12/2016 12/01/16   Fuller Plan, MD    Family History Family History  Problem Relation Age of Onset  . Hypertension Mother   . Hypertension Father     Social History Social History  Substance Use Topics  . Smoking status: Never Smoker  . Smokeless tobacco: Never Used  . Alcohol use Yes     Comment: 11/21/2016 "last drank 06/2016"     Allergies   Motrin [ibuprofen] and Zofran [ondansetron hcl]   Review of Systems Review of Systems  Respiratory: Positive for shortness of breath.   Cardiovascular: Positive for palpitations.  Musculoskeletal: Positive for back pain.  Psychiatric/Behavioral: The patient is nervous/anxious.   All other systems reviewed and are negative.    Physical Exam Updated Vital Signs BP 116/84   Pulse 83   Temp 97.4 F (36.3 C) (Oral)   Resp 16   SpO2 97%   Physical Exam  Constitutional: He is oriented to person, place, and time. He appears well-developed and well-nourished. No distress.  HENT:  Head: Normocephalic and atraumatic.  Right Ear: Hearing normal.  Left Ear: Hearing normal.  Nose: Nose normal.  Mouth/Throat: Oropharynx is clear and moist and mucous membranes are normal.  Eyes: Conjunctivae and EOM are normal. Pupils are equal, round, and reactive to light.  Neck: Normal range of motion. Neck supple.  Cardiovascular: Regular rhythm, S1 normal and S2 normal.  Exam reveals no gallop and no friction rub.   No murmur heard. Pulmonary/Chest: Effort normal and breath sounds normal. No respiratory distress. He exhibits no tenderness.  Abdominal: Soft. Normal appearance and bowel sounds are normal. There is no hepatosplenomegaly. There is no tenderness. There is no rebound, no guarding, no tenderness at McBurney's point and negative Murphy's sign. No  hernia.  Musculoskeletal: Normal range of motion.  Neurological: He is alert and oriented to person, place, and time. He has normal strength. No cranial nerve deficit or sensory deficit. Coordination normal. GCS eye subscore is 4. GCS verbal subscore is 5. GCS motor subscore is 6.  Skin: Skin is warm, dry and intact. No rash noted. No cyanosis.  Psychiatric: He has a normal mood and affect. His speech is normal and behavior is normal. Thought content normal.  Nursing note and vitals reviewed.    ED Treatments / Results  Labs (all labs ordered are listed, but only abnormal results are displayed) Labs Reviewed  CBC WITH DIFFERENTIAL/PLATELET - Abnormal; Notable for the following:       Result Value   Lymphs Abs 5.0 (*)    All other components within normal limits  BASIC METABOLIC PANEL - Abnormal; Notable for the following:    Chloride 100 (*)    Glucose, Bld 108 (*)    All other components within normal limits  D-DIMER, QUANTITATIVE (NOT AT [facility])  Rosezena Sensor, ED    EKG  EKG Interpretation  Date/Time:  Tuesday December 12 2016 02:06:17 EDT Ventricular Rate:  124 PR Interval:  140 QRS Duration: 98 QT Interval:  322 QTC Calculation: 462 R Axis:   -64 Text Interpretation:  Sinus tachycardia Left anterior fascicular block Minimal voltage criteria for LVH, may be normal variant Abnormal ECG No significant change since last tracing Confirmed by POLLINA  MD, Cristal Deer ([id number]) on 12/12/2016 5:14:19 AM Also confirmed by Blinda Leatherwood  MD, CHRISTOPHER [phone]), editor [place]  CCT, BEVERLY ([id number])  on 12/12/2016 6:59:58 AM       Radiology Dg Chest 2 View  Result Date: 12/12/2016 CLINICAL DATA:  Dyspnea and EXAM: CHEST  2 VIEW COMPARISON:  11/23/2016 FINDINGS: The heart size and mediastinal contours are within normal limits. Both lungs are clear. The visualized skeletal structures are unremarkable. IMPRESSION: No active cardiopulmonary disease. Electronically Signed   By: Tollie Eth M.D.    On: 12/12/2016 03:02    Procedures Procedures (including critical care time)  Medications Ordered in ED Medications  sodium chloride 0.9 % bolus 1,000 mL (0 mLs Intravenous Stopped 12/12/16 0716)  promethazine (PHENERGAN) injection 25 mg (25 mg Intravenous Given 12/12/16 0557)     Initial Impression / Assessment and Plan / ED Course  I have reviewed the triage vital signs and the nursing notes.  Pertinent labs & imaging results that were available during my care of the patient were reviewed by me and considered in my medical decision making (see chart for details).     Patient presents to the emergency department for evaluation of shortness of breath. Patient was awakened from sleep with shortness of breath and palpitations. He does have a history of anxiety. Patient anxious here in the ER and it is felt that his symptoms are likely secondary to  his anxiety. He did not have chest pain. EKG unremarkable. Troponin negative. Blood work normal. He also had a d-dimer that was negative. He was initially tachycardic at arrival with his anxiety, but this resolved without intervention, other than IV fluids. Patient is felt to be very low risk for PE.  Final Clinical Impressions(s) / ED Diagnoses   Final diagnoses:  Anxiety attack    New Prescriptions New Prescriptions   No medications on file     Gilda Crease, MD 12/12/16 [phone]

## 2016-12-20 ENCOUNTER — Emergency Department (HOSPITAL_COMMUNITY)
Admission: EM | Admit: 2016-12-20 | Discharge: 2016-12-20 | Disposition: A | Discharge status: Home / Self Care | Payer: Self-pay

## 2016-12-20 ENCOUNTER — Ambulatory Visit (HOSPITAL_COMMUNITY)
Admission: EM | Admit: 2016-12-20 | Discharge: 2016-12-20 | Disposition: A | Discharge status: Home / Self Care | Payer: Self-pay | Attending: Family Medicine | Admitting: Family Medicine

## 2016-12-20 ENCOUNTER — Encounter (HOSPITAL_COMMUNITY): Payer: Self-pay | Admitting: Family Medicine

## 2016-12-20 DIAGNOSIS — M791 Myalgia: Secondary | ICD-10-CM

## 2016-12-20 DIAGNOSIS — M7918 Myalgia, other site: Secondary | ICD-10-CM

## 2016-12-20 MED ORDER — CYCLOBENZAPRINE HCL 5 MG PO TABS: 5.0000 mg | ORAL_TABLET | Freq: Two times a day (BID) | ORAL | 0 refills | Status: DC | PRN
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
## 2016-12-21 ENCOUNTER — Ambulatory Visit: Payer: Self-pay

## 2016-12-21 ENCOUNTER — Encounter: Payer: Self-pay | Admitting: General Practice

## 2016-12-24 ENCOUNTER — Emergency Department (HOSPITAL_COMMUNITY)
Admission: EM | Admit: 2016-12-24 | Discharge: 2016-12-24 | Disposition: A | Discharge status: Home / Self Care | Payer: Self-pay | Attending: Emergency Medicine | Admitting: Emergency Medicine

## 2016-12-24 ENCOUNTER — Encounter (HOSPITAL_COMMUNITY): Payer: Self-pay

## 2016-12-24 DIAGNOSIS — R0981 Nasal congestion: Secondary | ICD-10-CM | POA: Insufficient documentation

## 2016-12-24 MED ORDER — FLUTICASONE PROPIONATE 50 MCG/ACT NA SUSP: 1.0000 | Freq: Every day | NASAL | 0 refills | Status: DC

## 2016-12-24 NOTE — ED Triage Notes (Signed)
Pt complaining of nasal congestion. Pt states feels like this when weather changes. Pt denies any cough or SOB. Pt denies any fevers. Pt a/o x 4 at triage. VSS, NAD.

## 2016-12-24 NOTE — ED Provider Notes (Signed)
[facility]   CSN: [id number] Arrival date & time: 12/24/16  1935     History   Chief Complaint Chief Complaint  Patient presents with  . Nasal Congestion    HPI Wesley [name] is a 51 y.o. male.  HPI Patient reports nasal congestion over the past 48 hours without shortness of breath or cough.  No fevers or chills.  No history of sinus infections.  Symptoms are mild in severity.  He has not tried any medications prior to arrival.   Past Medical History:  Diagnosis Date  . Alcohol abuse   . Anxiety   . Chronic neck pain   . Cocaine abuse     Patient Active Problem List   Diagnosis Date Noted  . Back pain 11/27/2016  . Bilateral posterior neck pain 11/27/2016  . Epigastric abdominal pain 11/21/2016  . T wave inversion in EKG 11/21/2016  . Nausea 11/21/2016  . Anxiety 11/21/2016    Past Surgical History:  Procedure Laterality Date  . EYE SURGERY Right ~ 1980   "hit my eye"       Home Medications    Prior to Admission medications   Medication Sig Start Date End Date Taking? Authorizing Provider  ALPRAZolam Prudy Feeler) 0.5 MG tablet Take 1 tablet (0.5 mg total) by mouth every 8 (eight) hours as needed for anxiety. 12/01/16   Fuller Plan, MD  cyclobenzaprine (FLEXERIL) 5 MG tablet Take 1 tablet (5 mg total) by mouth 2 (two) times daily as needed for muscle spasms. 12/20/16   Elvina Sidle, MD  fluticasone (FLONASE) 50 MCG/ACT nasal spray Place 1 spray into both nostrils daily. 12/24/16   Azalia Bilis, MD    Family History Family History  Problem Relation Age of Onset  . Hypertension Mother   . Hypertension Father     Social History Social History  Substance Use Topics  . Smoking status: Never Smoker  . Smokeless tobacco: Never Used  . Alcohol use Yes     Comment: 11/21/2016 "last drank 06/2016"     Allergies   Motrin [ibuprofen] and Zofran [ondansetron hcl]   Review of Systems Review of Systems  All other systems reviewed  and are negative.    Physical Exam Updated Vital Signs BP (!) 131/98 (BP Location: Left Arm)   Pulse 92   Temp 98.2 F (36.8 C) (Oral)   Resp 16   Ht  (1.676 m)   Wt 185 lb (83.9 kg)   SpO2 98%   BMI 29.86 kg/m   Physical Exam  Constitutional: He is oriented to person, place, and time. He appears well-developed and well-nourished.  HENT:  Head: Normocephalic and atraumatic.  Bilateral TMs are normal.  Posterior pharynx is normal.  No significant sinus tenderness.  Eyes: EOM are normal.  Neck: Normal range of motion.  Cardiovascular: Normal rate and regular rhythm.   Pulmonary/Chest: Effort normal and breath sounds normal.  Abdominal: Soft.  Musculoskeletal: Normal range of motion.  Neurological: He is alert and oriented to person, place, and time.  Skin: Skin is warm and dry.  Psychiatric: He has a normal mood and affect. Judgment normal.  Nursing note and vitals reviewed.    ED Treatments / Results  Labs (all labs ordered are listed, but only abnormal results are displayed) Labs Reviewed - No data to display  EKG  EKG Interpretation None       Radiology No results found.  Procedures Procedures (including critical care time)  Medications Ordered in  ED Medications - No data to display   Initial Impression / Assessment and Plan / ED Course  I have reviewed the triage vital signs and the nursing notes.  Pertinent labs & imaging results that were available during my care of the patient were reviewed by me and considered in my medical decision making (see chart for details).     Well-appearing.  Home with Flonase.  Final Clinical Impressions(s) / ED Diagnoses   Final diagnoses:  Nasal congestion    New Prescriptions New Prescriptions   FLUTICASONE (FLONASE) 50 MCG/ACT NASAL SPRAY    Place 1 spray into both nostrils daily.     Azalia Bilis, MD 12/24/16 2041

## 2016-12-25 ENCOUNTER — Emergency Department (HOSPITAL_COMMUNITY)
Admission: EM | Admit: 2016-12-25 | Discharge: 2016-12-25 | Disposition: A | Discharge status: Home / Self Care | Payer: Self-pay | Attending: Emergency Medicine | Admitting: Emergency Medicine

## 2016-12-25 ENCOUNTER — Encounter (HOSPITAL_COMMUNITY): Payer: Self-pay

## 2016-12-25 DIAGNOSIS — F419 Anxiety disorder, unspecified: Secondary | ICD-10-CM | POA: Insufficient documentation

## 2016-12-25 DIAGNOSIS — R110 Nausea: Secondary | ICD-10-CM | POA: Insufficient documentation

## 2016-12-25 MED ORDER — LORAZEPAM 1 MG PO TABS
1.0000 mg | ORAL_TABLET | Freq: Once | ORAL | Status: AC
  Administered 2016-12-25: 1 mg via ORAL
  Filled 2016-12-25: qty 1

## 2016-12-25 MED ORDER — PROMETHAZINE HCL 25 MG/ML IJ SOLN: 25.0000 mg | Freq: Once | INTRAMUSCULAR | Status: DC

## 2016-12-25 MED ORDER — DIPHENHYDRAMINE HCL 50 MG/ML IJ SOLN: 25.0000 mg | Freq: Once | INTRAMUSCULAR | Status: DC

## 2016-12-25 MED ORDER — PROMETHAZINE HCL 25 MG PO TABS
25.0000 mg | ORAL_TABLET | Freq: Once | ORAL | Status: AC
  Administered 2016-12-25: 25 mg via ORAL
  Filled 2016-12-25: qty 1

## 2016-12-25 MED ORDER — PROMETHAZINE HCL 25 MG PO TABS: 25.0000 mg | ORAL_TABLET | Freq: Four times a day (QID) | ORAL | 0 refills | Status: DC | PRN

## 2016-12-25 NOTE — Discharge Instructions (Signed)
To find a primary care or specialty doctor please call [phone] or [phone] to access "[facility] Find a Doctor Service." ° °You may also go on the [facility] website at [email or web address] ° °There are also multiple [facility], [facility] practices throughout the [place] that are frequently accepting new patients. You may find a clinic that is close to your home and contact them. ° °[facility] -  °[address] °[place] [address] [phone] ° ° °[name] -  °[address] °[place] [address] [phone] ° ° °[facility] - °[address]  °[place] [address] °[phone] ° ° °

## 2016-12-25 NOTE — ED Triage Notes (Signed)
Pt states seen here for nasal congestion this evening. Pt returned this evening for nausea. Pt states awoken from sleep for same. Pt a/o x 4, ambulatory at triage. NAD. Pt denies any emesis tonight, just dry heaves.

## 2016-12-25 NOTE — ED Provider Notes (Signed)
TIME SEEN: 2:44 AM  By signing my name below, I, Clarisse Gouge, attest that this documentation has been prepared under the direction and in the presence of Kristen N Ward, DO. Electronically signed, Clarisse Gouge, ED Scribe. 12/25/16. 2:54 AM.   CHIEF COMPLAINT: Nervousness  HPI: Wesley [name] is a 51 y.o. male with h/o polysubstance abuse, anxiety transported via family to the Emergency Department with concern for nervousness waking him PTA. He notes associated nausea and shaking all over tonight that he describes as "nervousness". No modifying factors noted. Pt compliant with prescribed xanax for anxiety at home. Records indicate pt seen 44 times in the ED in the past 6 months, last yesterday ~7:35 PM in [facility] ED. Pt denies diarrhea, fever, chest pain, abdominal pain, any new pain or any other complaints at this time.  ROS: See HPI Constitutional: no fever  Eyes: no drainage  ENT: no runny nose   Cardiovascular:  no chest pain  Resp: no SOB  GI: no vomiting GU: no dysuria Integumentary: no rash  Allergy: no hives  Musculoskeletal: no leg swelling  Neurological: no slurred speech ROS otherwise negative  PAST MEDICAL HISTORY/PAST SURGICAL HISTORY:  Past Medical History:  Diagnosis Date  . Alcohol abuse   . Anxiety   . Chronic neck pain   . Cocaine abuse     MEDICATIONS:  Prior to Admission medications   Medication Sig Start Date End Date Taking? Authorizing Provider  ALPRAZolam Prudy Feeler) 0.5 MG tablet Take 1 tablet (0.5 mg total) by mouth every 8 (eight) hours as needed for anxiety. 12/01/16   Fuller Plan, MD  cyclobenzaprine (FLEXERIL) 5 MG tablet Take 1 tablet (5 mg total) by mouth 2 (two) times daily as needed for muscle spasms. 12/20/16   Elvina Sidle, MD  fluticasone (FLONASE) 50 MCG/ACT nasal spray Place 1 spray into both nostrils daily. 12/24/16   Azalia Bilis, MD    ALLERGIES:  Allergies  Allergen Reactions  . Motrin [Ibuprofen] Other (See Comments)    Makes his  throat get "very dry" after taking it  . Zofran [Ondansetron Hcl] Other (See Comments)    Sweating and body feels bad    SOCIAL HISTORY:  Social History  Substance Use Topics  . Smoking status: Never Smoker  . Smokeless tobacco: Never Used  . Alcohol use Yes     Comment: 11/21/2016 "last drank 06/2016"    FAMILY HISTORY: Family History  Problem Relation Age of Onset  . Hypertension Mother   . Hypertension Father     EXAM: BP (!) 134/98 (BP Location: Right Arm)   Pulse 90   Temp 98.3 F (36.8 C) (Oral)   Resp 15   SpO2 99%  CONSTITUTIONAL: Alert and oriented and responds appropriately to questions. Well-appearing; well-nourished, In no distress HEAD: Normocephalic EYES: Conjunctivae clear, pupils appear equal, EOMI ENT: normal nose; moist mucous membranes NECK: Supple, no meningismus, no nuchal rigidity, no LAD  CARD: RRR; S1 and S2 appreciated; no murmurs, no clicks, no rubs, no gallops RESP: Normal chest excursion without splinting or tachypnea; breath sounds clear and equal bilaterally; no wheezes, no rhonchi, no rales, no hypoxia or respiratory distress, speaking full sentences ABD/GI: Normal bowel sounds; non-distended; soft, non-tender, no rebound, no guarding, no peritoneal signs, no hepatosplenomegaly BACK:  The back appears normal and is non-tender to palpation, there is no CVA tenderness EXT: Normal ROM in all joints; non-tender to palpation; no edema; normal capillary refill; no cyanosis, no calf tenderness or swelling  SKIN: Normal color for age and race; warm; no rash NEURO: Moves all extremities equally, normal gait, normal speech, no facial droop PSYCH: The patient's mood and manner are appropriate. Grooming and personal hygiene are appropriate.  MEDICAL DECISION MAKING: Pt heere complains of anxiety and feeling he was shaking all over and nauseated. He reports taking Xanax tonight without relief. We'll give him a dose of oral Ativan here and oral Phenergan.  Abdominal exam is benign. He is otherwise well-appearing. He denies vomiting, diarrhea. No chest pain or abdominal pain. I do not feel he needs further emergent workup. Patient is frequently here in the emergency department and I have discussed with him and his wife at length that he needs to establish care with a primary care physician for further management of nonemergent conditions.  At this time, I do not feel there is any life-threatening condition present. I have reviewed and discussed all results (EKG, imaging, lab, urine as appropriate) and exam findings with patient/family. I have reviewed nursing notes and appropriate previous records.  I feel the patient is safe to be discharged home without further emergent workup and can continue workup as an outpatient as needed. Discussed usual and customary return precautions. Patient/family verbalize understanding and are comfortable with this plan.  Outpatient follow-up has been provided if needed. All questions have been answered.   I personally performed the services described in this documentation, which was scribed in my presence. The recorded information has been reviewed and is accurate.    Layla Maw Ward, DO 12/25/16 [phone]

## 2016-12-30 ENCOUNTER — Ambulatory Visit (HOSPITAL_COMMUNITY)
Admission: EM | Admit: 2016-12-30 | Discharge: 2016-12-30 | Disposition: A | Discharge status: Home / Self Care | Payer: Self-pay | Attending: Family Medicine | Admitting: Family Medicine

## 2016-12-30 ENCOUNTER — Encounter (HOSPITAL_COMMUNITY): Payer: Self-pay

## 2016-12-30 ENCOUNTER — Encounter (HOSPITAL_COMMUNITY): Payer: Self-pay | Admitting: Emergency Medicine

## 2016-12-30 ENCOUNTER — Emergency Department (HOSPITAL_COMMUNITY)
Admission: EM | Admit: 2016-12-30 | Discharge: 2016-12-30 | Disposition: A | Discharge status: Home / Self Care | Payer: Self-pay | Attending: Emergency Medicine | Admitting: Emergency Medicine

## 2016-12-30 DIAGNOSIS — Z79899 Other long term (current) drug therapy: Secondary | ICD-10-CM | POA: Insufficient documentation

## 2016-12-30 DIAGNOSIS — F419 Anxiety disorder, unspecified: Secondary | ICD-10-CM | POA: Insufficient documentation

## 2016-12-30 DIAGNOSIS — Z5321 Procedure and treatment not carried out due to patient leaving prior to being seen by health care provider: Secondary | ICD-10-CM | POA: Insufficient documentation

## 2016-12-30 DIAGNOSIS — F410 Panic disorder [episodic paroxysmal anxiety] without agoraphobia: Secondary | ICD-10-CM

## 2016-12-30 MED ORDER — TRAZODONE HCL 50 MG PO TABS: 50.0000 mg | ORAL_TABLET | Freq: Two times a day (BID) | ORAL | 0 refills | Status: DC

## 2016-12-30 NOTE — ED Provider Notes (Signed)
Patient left without being seen  1. Patient left without being seen       Melene Plan, DO 12/30/16 [phone]

## 2016-12-30 NOTE — ED Triage Notes (Signed)
Patient complains of feeling nervous and anxious since 0400. Take xanax daily for same and states this am not helping. Denies ETOH and drug use. Alert and oriented, NAD

## 2016-12-30 NOTE — ED Provider Notes (Signed)
CSN: [id number]     Arrival date & time 12/30/16  1221 History   First MD Initiated Contact with Patient 12/30/16 1344     Chief Complaint  Patient presents with  . Anxiety   (Consider location/radiation/quality/duration/timing/severity/associated sxs/prior Treatment) Patient c/o anxiety and panic.  He has been to the ED multiple times for this over last 6 months.  He is taking promethazine and xanax but it is not helping.  He does not see  A pcp or psychiatry.  He quit using cocaine and alcohol about 6 months ago and noticed these sx's after quitting.  Denies homicidal and suicidal ideation.   The history is provided by the patient.  Anxiety  This is a new problem. The problem occurs constantly. The problem has not changed since onset.Nothing aggravates the symptoms. Nothing relieves the symptoms. He has tried nothing for the symptoms.    Past Medical History:  Diagnosis Date  . Alcohol abuse   . Anxiety   . Chronic neck pain   . Cocaine abuse    Past Surgical History:  Procedure Laterality Date  . EYE SURGERY Right ~ 1980   "hit my eye"   Family History  Problem Relation Age of Onset  . Hypertension Mother   . Hypertension Father    Social History  Substance Use Topics  . Smoking status: Never Smoker  . Smokeless tobacco: Never Used  . Alcohol use Yes     Comment: 11/21/2016 "last drank 06/2016"    Review of Systems  Constitutional: Negative.   HENT: Negative.   Eyes: Negative.   Respiratory: Negative.   Cardiovascular: Negative.   Gastrointestinal: Negative.   Endocrine: Negative.   Genitourinary: Negative.   Musculoskeletal: Negative.   Allergic/Immunologic: Negative.   Neurological: Negative.   Hematological: Negative.   Psychiatric/Behavioral: Positive for agitation.    Allergies  Motrin [ibuprofen] and Zofran [ondansetron hcl]  Home Medications   Prior to Admission medications   Medication Sig Start Date End Date Taking? Authorizing Provider   ALPRAZolam Prudy Feeler) 0.5 MG tablet Take 1 tablet (0.5 mg total) by mouth every 8 (eight) hours as needed for anxiety. 12/01/16  Yes Fuller Plan, MD  promethazine (PHENERGAN) 25 MG tablet Take 1 tablet (25 mg total) by mouth every 6 (six) hours as needed for nausea or vomiting. 12/25/16  Yes Kristen N Ward, DO  cyclobenzaprine (FLEXERIL) 5 MG tablet Take 1 tablet (5 mg total) by mouth 2 (two) times daily as needed for muscle spasms. 12/20/16   Elvina Sidle, MD  fluticasone (FLONASE) 50 MCG/ACT nasal spray Place 1 spray into both nostrils daily. 12/24/16   Azalia Bilis, MD  traZODone (DESYREL) 50 MG tablet Take 1 tablet (50 mg total) by mouth 2 (two) times daily. 12/30/16   Deatra Canter, FNP   Meds Ordered and Administered this Visit  Medications - No data to display  BP 115/85 (BP Location: Right Arm)   Pulse 72   Temp 97.7 F (36.5 C) (Oral)   Resp 20   SpO2 100%  No data found.   Physical Exam  Constitutional: He appears well-developed and well-nourished.  HENT:  Head: Normocephalic and atraumatic.  Right Ear: External ear normal.  Left Ear: External ear normal.  Mouth/Throat: Oropharynx is clear and moist.  Eyes: Conjunctivae and EOM are normal. Pupils are equal, round, and reactive to light.  Neck: Normal range of motion. Neck supple.  Cardiovascular: Normal rate, regular rhythm and normal heart sounds.   Pulmonary/Chest: Effort normal  and breath sounds normal.  Abdominal: Soft.  Neurological: He is alert.  Anxious  Nursing note and vitals reviewed.   Urgent Care Course     Procedures (including critical care time)  Labs Review Labs Reviewed - No data to display  Imaging Review No results found.   Visual Acuity Review  Right Eye Distance:   Left Eye Distance:   Bilateral Distance:    Right Eye Near:   Left Eye Near:    Bilateral Near:         MDM   1. Anxiety attack   2. Panic attack    Trazodone  one po bid #60 Follow up with Scherrie Gerlach, FNP 12/30/16 1415

## 2016-12-30 NOTE — ED Triage Notes (Signed)
Here for anxiety sx onset 0400... Taking Xanax and Promethazine w/no relief  Reports he quit drinking alcohol in 06/2016... Has not had any alcohol since then.   A&O x4... NAD

## 2016-12-30 NOTE — ED Notes (Signed)
Called pt for room no answer 

## 2016-12-30 NOTE — ED Notes (Signed)
Called pt for room no answer X2.

## 2017-01-02 ENCOUNTER — Ambulatory Visit (HOSPITAL_COMMUNITY)
Admission: EM | Admit: 2017-01-02 | Discharge: 2017-01-02 | Disposition: A | Discharge status: Home / Self Care | Payer: Self-pay | Attending: Internal Medicine | Admitting: Internal Medicine

## 2017-01-02 ENCOUNTER — Encounter (HOSPITAL_COMMUNITY): Payer: Self-pay | Admitting: Family Medicine

## 2017-01-02 DIAGNOSIS — F419 Anxiety disorder, unspecified: Secondary | ICD-10-CM

## 2017-01-02 DIAGNOSIS — M542 Cervicalgia: Secondary | ICD-10-CM

## 2017-01-02 MED ORDER — DICLOFENAC SODIUM 75 MG PO TBEC: 75.0000 mg | DELAYED_RELEASE_TABLET | Freq: Two times a day (BID) | ORAL | 0 refills | Status: DC

## 2017-01-02 NOTE — ED Triage Notes (Signed)
Pt here for hypertension. sts that last night he felt like it was elevated. sts he felt very hot and pain ni neck and legs. Pt is showing me medications that he takes to include trazodone and xanax. No BP meds.
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
## 2017-01-02 NOTE — Discharge Instructions (Signed)
For your neck pain, prescribed a medicine called diclofenac, take one tablet twice a day as needed for pain.  For your anxiety, I recommend you keep your appointment as scheduled for your primary care provider as this provider will be able to help you with you anxiety.

## 2017-01-05 ENCOUNTER — Encounter (HOSPITAL_COMMUNITY): Payer: Self-pay | Admitting: Emergency Medicine

## 2017-01-05 DIAGNOSIS — R0789 Other chest pain: Secondary | ICD-10-CM | POA: Insufficient documentation

## 2017-01-05 DIAGNOSIS — Z79899 Other long term (current) drug therapy: Secondary | ICD-10-CM | POA: Insufficient documentation

## 2017-01-05 LAB — BASIC METABOLIC PANEL
Anion gap: 7 (ref 5–15)
BUN: 10 mg/dL (ref 6–20)
CO2: 25 mmol/L (ref 22–32)
Calcium: 9 mg/dL (ref 8.9–10.3)
Chloride: 105 mmol/L (ref 101–111)
Creatinine, Ser: 0.75 mg/dL (ref 0.61–1.24)
GFR calc Af Amer: >60 mL/min (ref >60)
GFR calc non Af Amer: >60 mL/min (ref >60)
Glucose, Bld: 113 mg/dL — ABNORMAL HIGH (ref 65–99)
Potassium: 3.9 mmol/L (ref 3.5–5.1)
Sodium: 137 mmol/L (ref 135–145)

## 2017-01-05 LAB — CBC
HCT: 41 % (ref 39.0–52.0)
Hemoglobin: 13.4 g/dL (ref 13.0–17.0)
MCH: 27.3 pg (ref 26.0–34.0)
MCHC: 32.7 g/dL (ref 30.0–36.0)
MCV: 83.5 fL (ref 78.0–100.0)
Platelets: 241 10*3/uL (ref 150–400)
RBC: 4.91 MIL/uL (ref 4.22–5.81)
RDW: 13.1 % (ref 11.5–15.5)
WBC: 7.7 10*3/uL (ref 4.0–10.5)

## 2017-01-05 LAB — I-STAT TROPONIN, ED: Troponin i, poc: 0 ng/mL (ref 0.00–0.08)

## 2017-01-05 NOTE — ED Triage Notes (Signed)
Pt c/o 7/10 left cp that started 2 hour ago and some anxiety, pt is c/o constant nausea no vomiting denies any fever, having some dizziness with the pain.

## 2017-01-06 ENCOUNTER — Emergency Department (HOSPITAL_COMMUNITY)
Admission: EM | Admit: 2017-01-06 | Discharge: 2017-01-06 | Disposition: A | Discharge status: Home / Self Care | Payer: Self-pay | Attending: Emergency Medicine | Admitting: Emergency Medicine

## 2017-01-06 ENCOUNTER — Emergency Department (HOSPITAL_COMMUNITY): Payer: Self-pay

## 2017-01-06 DIAGNOSIS — R0789 Other chest pain: Secondary | ICD-10-CM

## 2017-01-06 LAB — I-STAT TROPONIN, ED: Troponin i, poc: 0 ng/mL (ref 0.00–0.08)
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
## 2017-01-06 NOTE — ED Notes (Signed)
Pt reports he started to have chest tightness at 2000 hrs this date. Pt takes ativan for anxiety and has run out of same.

## 2017-01-10 ENCOUNTER — Emergency Department (HOSPITAL_COMMUNITY)
Admission: EM | Admit: 2017-01-10 | Discharge: 2017-01-10 | Disposition: A | Discharge status: Home / Self Care | Payer: Self-pay | Attending: Dermatology | Admitting: Dermatology

## 2017-01-10 ENCOUNTER — Ambulatory Visit: Payer: Self-pay | Admitting: Diagnostic Neuroimaging

## 2017-01-10 ENCOUNTER — Encounter (HOSPITAL_COMMUNITY): Payer: Self-pay | Admitting: *Deleted

## 2017-01-10 ENCOUNTER — Ambulatory Visit (HOSPITAL_COMMUNITY): Admission: EM | Admit: 2017-01-10 | Discharge: 2017-01-10 | Discharge status: Against Medical Advice | Payer: Self-pay

## 2017-01-10 DIAGNOSIS — Z5321 Procedure and treatment not carried out due to patient leaving prior to being seen by health care provider: Secondary | ICD-10-CM | POA: Insufficient documentation

## 2017-01-10 DIAGNOSIS — R531 Weakness: Secondary | ICD-10-CM | POA: Insufficient documentation

## 2017-01-10 NOTE — ED Triage Notes (Signed)
Pt reports taking his diazepam today as prescribed then two hours later felt very weak and tired. No acute distress is noted at triage.

## 2017-01-10 NOTE — ED Notes (Signed)
No answer for triage.

## 2017-01-11 ENCOUNTER — Encounter: Payer: Self-pay | Admitting: Diagnostic Neuroimaging

## 2017-01-14 ENCOUNTER — Other Ambulatory Visit: Payer: Self-pay

## 2017-01-14 ENCOUNTER — Encounter (HOSPITAL_COMMUNITY): Payer: Self-pay | Admitting: Emergency Medicine

## 2017-01-14 ENCOUNTER — Emergency Department (HOSPITAL_COMMUNITY)
Admission: EM | Admit: 2017-01-14 | Discharge: 2017-01-14 | Disposition: A | Discharge status: Home / Self Care | Payer: Self-pay | Attending: Emergency Medicine | Admitting: Emergency Medicine

## 2017-01-14 DIAGNOSIS — R110 Nausea: Secondary | ICD-10-CM

## 2017-01-14 DIAGNOSIS — R42 Dizziness and giddiness: Secondary | ICD-10-CM | POA: Insufficient documentation

## 2017-01-14 LAB — CBC
HCT: 43.6 % (ref 39.0–52.0)
Hemoglobin: 14.2 g/dL (ref 13.0–17.0)
MCH: 27.6 pg (ref 26.0–34.0)
MCHC: 32.6 g/dL (ref 30.0–36.0)
MCV: 84.7 fL (ref 78.0–100.0)
Platelets: 246 10*3/uL (ref 150–400)
RBC: 5.15 MIL/uL (ref 4.22–5.81)
RDW: 13.2 % (ref 11.5–15.5)
WBC: 5.7 10*3/uL (ref 4.0–10.5)

## 2017-01-14 LAB — BASIC METABOLIC PANEL
Anion gap: 9 (ref 5–15)
BUN: 5 mg/dL — ABNORMAL LOW (ref 6–20)
CO2: 25 mmol/L (ref 22–32)
Calcium: 9.4 mg/dL (ref 8.9–10.3)
Chloride: 104 mmol/L (ref 101–111)
Creatinine, Ser: 0.79 mg/dL (ref 0.61–1.24)
GFR calc Af Amer: >60 mL/min (ref >60)
GFR calc non Af Amer: >60 mL/min (ref >60)
Glucose, Bld: 109 mg/dL — ABNORMAL HIGH (ref 65–99)
Potassium: 3.8 mmol/L (ref 3.5–5.1)
Sodium: 138 mmol/L (ref 135–145)

## 2017-01-14 LAB — CBG MONITORING, ED: Glucose-Capillary: 103 mg/dL — ABNORMAL HIGH (ref 65–99)

## 2017-01-14 MED ORDER — MECLIZINE HCL 12.5 MG PO TABS: 12.5000 mg | ORAL_TABLET | Freq: Three times a day (TID) | ORAL | 0 refills | Status: DC | PRN

## 2017-01-14 MED ORDER — PROMETHAZINE HCL 25 MG PO TABS: 25.0000 mg | ORAL_TABLET | Freq: Once | ORAL | Status: DC

## 2017-01-14 MED ORDER — METOCLOPRAMIDE HCL 10 MG PO TABS
10.0000 mg | ORAL_TABLET | Freq: Once | ORAL | Status: AC
Start: 2017-01-14 — End: 2017-01-14
  Administered 2017-01-14: 10 mg via ORAL
  Filled 2017-01-14: qty 1

## 2017-01-14 MED ORDER — METOCLOPRAMIDE HCL 10 MG PO TABS: 10.0000 mg | ORAL_TABLET | Freq: Three times a day (TID) | ORAL | 0 refills | Status: DC | PRN

## 2017-01-14 NOTE — Discharge Instructions (Signed)
Take Reglan for nausea as needed. Use meclizine as needed for dizziness and eye movements. Establish care with a primary care physician for management of your chronic medical problems. Return to the ED if you have any concerning symptoms.
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
## 2017-01-14 NOTE — ED Provider Notes (Signed)
[facility] Provider Note   CSN: [id number] Arrival date & time: 01/14/17  0946     History   Chief Complaint Chief Complaint  Patient presents with  . Dizziness  . Nausea  . Shoulder Pain    HPI Wesley [name] is a 51 y.o. male with pmhx anxiety, alcohol abuse, cocaine abuse, and chronic neck pain who presents to the ED today with chief complaint acute worsening of chronic, progressively improving nausea and neck spasm. He states he woke up this morning and states he had muscle spasm in his neck at around 5:30 AM for which he took Valium 5 mg which was helpful. He states he takes Valium as needed every few days for his neck spasm and also sees a chiropractor almost daily which is helpful. He states he felt lightheaded at church earlier today with "my eyes moving back and forth quickly" for a few seconds but denies any syncope. He took Valium at that time as well. He also endorses development of nausea and mild dull epigastric pain that does not radiate and is typical of his chronic nausea at this time. He states he has not had anything to eat or drink today due to his nausea. Denies vomiting, diarrhea, constipation, melena, hematochezia, dysuria, hematuria. He typically takes over-the-counter nausea medications which are helpful but has not had any today. He also endorses a cough which she believes may be due to allergies but does not take any allergy medications.   He states he knows he has high blood pressure but does not see a primary care for any of his chronic issues and does not take any hypertension medications.  Denies fever, chills, nasal congestion, sore throat, CP/SOB. HPI  Past Medical History:  Diagnosis Date  . Alcohol abuse   . Anxiety   . Chronic neck pain   . Cocaine abuse     Patient Active Problem List   Diagnosis Date Noted  . Back pain 11/27/2016  . Bilateral posterior neck pain 11/27/2016  . Epigastric abdominal pain 11/21/2016  . T wave inversion in  EKG 11/21/2016  . Nausea 11/21/2016  . Anxiety 11/21/2016    Past Surgical History:  Procedure Laterality Date  . EYE SURGERY Right ~ 1980   "hit my eye"       Home Medications    Prior to Admission medications   Medication Sig Start Date End Date Taking? Authorizing Provider  diazepam (VALIUM) 5 MG tablet Take 5 mg by mouth every 12 (twelve) hours as needed for anxiety.   Yes [provider]  ALPRAZolam (XANAX) 0.5 MG tablet Take 1 tablet (0.5 mg total) by mouth every 8 (eight) hours as needed for anxiety. Patient not taking: Reported on 01/14/2017 12/01/16   Fuller Planice, Christopher W, MD  cyclobenzaprine (FLEXERIL) 5 MG tablet Take 1 tablet (5 mg total) by mouth 2 (two) times daily as needed for muscle spasms. Patient not taking: Reported on 01/06/2017 12/20/16   Elvina SidleLauenstein, Kurt, MD  diclofenac (VOLTAREN) 75 MG EC tablet Take 1 tablet (75 mg total) by mouth 2 (two) times daily. Patient not taking: Reported on 01/06/2017 01/02/17   Dorena BodoKennard, Lawrence, NP  fluticasone [facility](FLONASE) 50 MCG/ACT nasal spray Place 1 spray into both nostrils daily. Patient not taking: Reported on 01/06/2017 12/24/16   Azalia Bilisampos, Kevin, MD  promethazine (PHENERGAN) 25 MG tablet Take 1 tablet (25 mg total) by mouth every 6 (six) hours as needed for nausea or vomiting. Patient not taking: Reported on 01/06/2017 12/25/16  Ward, Layla Maw, DO  traZODone (DESYREL) 50 MG tablet Take 1 tablet (50 mg total) by mouth 2 (two) times daily. Patient not taking: Reported on 01/14/2017 12/30/16   Deatra Canter, FNP    Family History Family History  Problem Relation Age of Onset  . Hypertension Mother   . Hypertension Father     Social History Social History  Substance Use Topics  . Smoking status: Never Smoker  . Smokeless tobacco: Never Used  . Alcohol use Yes     Comment: 11/21/2016 "last drank 06/2016"     Allergies   Motrin [ibuprofen] and Zofran [ondansetron hcl]   Review of Systems Review of Systems    Constitutional: Negative for chills and fever.  Respiratory: Positive for cough. Negative for shortness of breath.   Cardiovascular: Negative for chest pain.  Gastrointestinal: Positive for abdominal pain and nausea. Negative for blood in stool, constipation, diarrhea and vomiting.  Genitourinary: Negative for dysuria and hematuria.  Musculoskeletal: Positive for neck pain. Negative for back pain.  Neurological: Positive for light-headedness. Negative for syncope, weakness and headaches.     Physical Exam Updated Vital Signs BP (!) 148/101   Pulse (!) 101   Temp 97.6 F (36.4 C)   Resp 18   SpO2 98%   Physical Exam  Constitutional: He is oriented to person, place, and time. He appears well-developed and well-nourished. No distress.  HENT:  Head: Normocephalic and atraumatic.  Right Ear: External ear normal.  Left Ear: External ear normal.  Nose: Nose normal.  Mouth/Throat: Oropharynx is clear and moist.  No TTP of maxillary or frontal sinuses. Posterior oropharynx clear and moist with no erythema or exudate or tonsillar hypertrophy. Nasal septum midline with pink mucosa. TMs normal bilaterally. No tenderness palpation of skull.   Eyes: Conjunctivae are normal. Right eye exhibits no discharge. Left eye exhibits no discharge. No scleral icterus.  Right eye pupil fixed and nonreactive to light and irregularly-shaped. Patient says this is chronic after an accident and unchanged. No nystagmus or abnormal head impulse test.   Neck: Normal range of motion. Neck supple. No JVD present. No tracheal deviation present.  No midline CSP TTP. Bilateral para spinal muscle spasm noted. No deformity or crepitus.   Cardiovascular: Normal rate, regular rhythm, normal heart sounds and intact distal pulses.   Pt verbalized understanding of and agreement with plan and is safe for discharge home at this time.   Pulmonary/Chest: Effort normal and breath sounds normal. No respiratory distress. He has no  wheezes. He exhibits no tenderness.  Abdominal: Soft. Bowel sounds are normal. He exhibits no distension and no mass. There is no tenderness. There is no rebound and no guarding.  No tenderness to palpation in the epigastrium "patient states it feels like nausea ", negative Murphy's, negative Rovsing's, no tenderness to palpation at McBurney's point.   Genitourinary:  Genitourinary Comments: No CVA tenderness  Musculoskeletal: Normal range of motion. He exhibits no edema.  Full range of motion of BUE and BLE. 5/5 strength of BUE and BLE   Lymphadenopathy:    He has no cervical adenopathy.  Neurological: He is alert and oriented to person, place, and time. No cranial nerve deficit or sensory deficit.  Fluent speech, no facial droop, sensation intact globally   Skin: Skin is warm and dry. Capillary refill takes less than 2 seconds. He is not diaphoretic. No pallor.  Psychiatric: He has a normal mood and affect. His behavior is normal.  Generally anxious-appearing  ED Treatments / Results  Labs (all labs ordered are listed, but only abnormal results are displayed) Labs Reviewed  CBG MONITORING, ED - Abnormal; Notable for the following:       Result Value   Glucose-Capillary 103 (*)    All other components within normal limits  BASIC METABOLIC PANEL  CBC  URINALYSIS, ROUTINE W REFLEX MICROSCOPIC    EKG  EKG Interpretation None       Radiology No results found.  Procedures Procedures (including critical care time)  Medications Ordered in ED Medications  metoCLOPramide (REGLAN) tablet 10 mg (not administered)     Initial Impression / Assessment and Plan / ED Course  I have reviewed the triage vital signs and the nursing notes.  Pertinent labs & imaging results that were available during my care of the patient were reviewed by me and considered in my medical decision making (see chart for details).      Patient with chronic nausea and bilateral neck spasm  presents today with nausea and neck spasm since this morning . Neurovascularly intact with good strength. Patient is afebrile, vital signs are at his baseline hith improvement of his hypertension while in the ED. Discussed the importance of establishing primary care for management of chronic problems. He is well known to this ED with multiple negative workups. EKG unchanged from last. I have a low suspicion of ACS, MI,  hypertensive emergency, acute abdominal pathology such as appendicitis, bowel perforation, renal colic. Provided Reglan and by mouth challenge which patient tolerated and states his nausea is improved and patient is tolerating by mouth. Will give prescription for Reglan to take home as well as small course of meclizine to try for his dizziness which may be BPPV. Again strongly recommended establishment of primary care with [facility] and wellness or any other local facility for management of his symptoms. Discussed strict ED return precautions. Pt verbalized understanding of and agreement with plan and is safe for discharge home at this time   Final Clinical Impressions(s) / ED Diagnoses   Final diagnoses:  None    New Prescriptions New Prescriptions   No medications on file     Bennye Alm 01/14/17 1152    Loren Racer, MD 01/22/17 [phone]

## 2017-01-16 ENCOUNTER — Ambulatory Visit: Payer: Self-pay | Admitting: Neurology

## 2017-01-17 ENCOUNTER — Ambulatory Visit (HOSPITAL_COMMUNITY)
Admission: EM | Admit: 2017-01-17 | Discharge: 2017-01-17 | Disposition: A | Discharge status: Home / Self Care | Payer: Self-pay | Attending: Internal Medicine | Admitting: Internal Medicine

## 2017-01-17 ENCOUNTER — Encounter (HOSPITAL_COMMUNITY): Payer: Self-pay | Admitting: Family Medicine

## 2017-01-17 DIAGNOSIS — K2960 Other gastritis without bleeding: Secondary | ICD-10-CM

## 2017-01-17 DIAGNOSIS — R110 Nausea: Secondary | ICD-10-CM

## 2017-01-17 MED ORDER — OMEPRAZOLE 40 MG PO CPDR
40.0000 mg | DELAYED_RELEASE_CAPSULE | Freq: Two times a day (BID) | ORAL | 0 refills | Status: DC
Start: 2017-01-17 — End: 2017-02-02

## 2017-01-17 MED ORDER — PROPRANOLOL HCL 20 MG PO TABS: 20.0000 mg | ORAL_TABLET | Freq: Three times a day (TID) | ORAL | 0 refills | Status: DC | PRN

## 2017-01-17 NOTE — ED Triage Notes (Signed)
Pt here for nausea and dry mouth.

## 2017-01-17 NOTE — ED Provider Notes (Signed)
[facility]    CSN: [id number] Arrival date & time: 01/17/17  1055     History   Chief Complaint Chief Complaint  Patient presents with  . Nausea    HPI Wesley [name] is a 51 y.o. male. He reports that in the last year has had a lot of difficulty with anxiety and with epigastric discomfort. Stomach discomfort is like a ball, worse in the mornings, particularly in the last couple months. He has tried Maalox and Alka-Seltzer without lasting relief. Feels nauseous. Sometimes feels worse after eating. Not vomiting. No trouble with bowel movements. Taking some Valium, helps a little bit. Had a shoulder injury about a year ago, wonders if this is aggravated the anxiety and the epigastric discomfort.    HPI  Past Medical History:  Diagnosis Date  . Alcohol abuse   . Anxiety   . Chronic neck pain   . Cocaine abuse     Patient Active Problem List   Diagnosis Date Noted  . Back pain 11/27/2016  . Bilateral posterior neck pain 11/27/2016  . Epigastric abdominal pain 11/21/2016  . T wave inversion in EKG 11/21/2016  . Nausea 11/21/2016  . Anxiety 11/21/2016    Past Surgical History:  Procedure Laterality Date  . EYE SURGERY Right ~ 1980   "hit my eye"       Home Medications    Prior to Admission medications   Medication Sig Start Date End Date Taking? Authorizing Provider  diazepam (VALIUM) 5 MG tablet Take 5 mg by mouth every 12 (twelve) hours as needed for anxiety.    [provider]  meclizine (ANTIVERT) 12.5 MG tablet Take 1 tablet (12.5 mg total) by mouth 3 (three) times daily as needed for dizziness. 01/14/17   Luevenia MaxinFawze, Mina A, PA-C  metoCLOPramide (REGLAN) 10 MG tablet Take 1 tablet (10 mg total) by mouth every 8 (eight) hours as needed for nausea. 01/14/17 01/19/17  Michela PitcherFawze, Mina A, PA-C  omeprazole (PRILOSEC) 40 MG capsule Take 1 capsule (40 mg total) by mouth 2 (two) times daily before a meal. 01/17/17 01/31/17  Eustace MooreMurray, Laura W, MD  propranolol (INDERAL)  20 MG tablet Take 1 tablet (20 mg total) by mouth 3 (three) times daily as needed (nerves, blood pressure). 01/17/17   Eustace MooreMurray, Laura W, MD    Family History Family History  Problem Relation Age of Onset  . Hypertension Mother   . Hypertension Father     Social History Social History  Substance Use Topics  . Smoking status: Never Smoker  . Smokeless tobacco: Never Used  . Alcohol use Yes     Comment: 11/21/2016 "last drank 06/2016"     Allergies   Motrin [ibuprofen] and Zofran [ondansetron hcl]   Review of Systems Review of Systems  All other systems reviewed and are negative.    Physical Exam Triage Vital Signs ED Triage Vitals [01/17/17 1114]  Enc Vitals Group     BP (!) 121/99     Pulse Rate 83     Resp 18     Temp 98.5 F (36.9 C)     Temp src      SpO2 98 %     Weight      Height      Pain Score      Pain Loc    Updated Vital Signs BP (!) 121/99   Pulse 83   Temp 98.5 F (36.9 C)   Resp 18   SpO2 98%   Physical  Exam  Constitutional: He is oriented to person, place, and time.  Alert, nicely groomed Looks a little tense  HENT:  Head: Atraumatic.  Eyes:  Conjugate gaze, no eye redness/drainage  Neck: Neck supple.  Cardiovascular: Normal rate and regular rhythm.   Pulmonary/Chest: No respiratory distress. He has no wheezes. He has no rales.  Lungs clear, symmetric breath sounds  Abdominal: Soft. He exhibits no distension. There is no rebound and no guarding.  Moderate tenderness to deep palpation in the epigastrium  Musculoskeletal: Normal range of motion.  Neurological: He is alert and oriented to person, place, and time.  Skin: Skin is warm and dry.  No cyanosis  Nursing note and vitals reviewed.    UC Treatments / Results   Procedures Procedures (including critical care time) None today  Final Clinical Impressions(s) / UC Diagnoses   Final diagnoses:  Nausea  Reflux gastritis   Stomach discomfort seems most likely to be due to  stomach acid issue.  Prescription for omeprazole (prilosec) sent to the [facility] on [address] at [place].  Nerves/anxiety can aggravate stomach acid trouble, as can coffee, alcohol, smoking, and certain medicines (anti inflammatories, pain relievers).  Prescription for propranolol, which helps nerves and blood pressure, and may help reduce difficulty with stomach, was also sent to the pharmacy, to take as needed.    New Prescriptions New Prescriptions   OMEPRAZOLE (PRILOSEC) 40 MG CAPSULE    Take 1 capsule (40 mg total) by mouth 2 (two) times daily before a meal.   PROPRANOLOL (INDERAL) 20 MG TABLET    Take 1 tablet (20 mg total) by mouth 3 (three) times daily as needed (nerves, blood pressure).     Eustace Moore, MD 01/21/17 1009

## 2017-01-17 NOTE — Discharge Instructions (Addendum)
Stomach discomfort seems most likely to be due to stomach acid issue.  Prescription [facility] [address] [facility] [place].  Nerves/anxiety can aggravate stomach acid trouble, as can coffee, alcohol, smoking, and certain medicines (anti inflammatories, pain relievers).  Prescription for propranolol, which helps nerves and blood pressure, and may help reduce difficulty with stomach, was also sent to the pharmacy, to take as needed.

## 2017-01-17 NOTE — Discharge Planning (Signed)
[facility] in 6 months[facility]  Per notes, pt has left numerous times without being seen. [facility]  Will reach out to pt to discuss ER vs PCP office visits.

## 2017-01-22 ENCOUNTER — Encounter (HOSPITAL_COMMUNITY): Payer: Self-pay | Admitting: Emergency Medicine

## 2017-01-22 DIAGNOSIS — R55 Syncope and collapse: Secondary | ICD-10-CM | POA: Insufficient documentation

## 2017-01-22 DIAGNOSIS — Z5321 Procedure and treatment not carried out due to patient leaving prior to being seen by health care provider: Secondary | ICD-10-CM | POA: Insufficient documentation

## 2017-01-22 LAB — URINALYSIS, ROUTINE W REFLEX MICROSCOPIC
Bilirubin Urine: NEGATIVE
Glucose, UA: NEGATIVE mg/dL
Hgb urine dipstick: NEGATIVE
Ketones, ur: NEGATIVE mg/dL
Leukocytes, UA: NEGATIVE
Nitrite: NEGATIVE
Protein, ur: NEGATIVE mg/dL
Specific Gravity, Urine: 1.017 (ref 1.005–1.030)
pH: 6 (ref 5.0–8.0)

## 2017-01-22 LAB — BASIC METABOLIC PANEL
Anion gap: 7 (ref 5–15)
BUN: 10 mg/dL (ref 6–20)
CO2: 25 mmol/L (ref 22–32)
Calcium: 9.5 mg/dL (ref 8.9–10.3)
Chloride: 104 mmol/L (ref 101–111)
Creatinine, Ser: 0.82 mg/dL (ref 0.61–1.24)
GFR calc Af Amer: >60 mL/min (ref >60)
GFR calc non Af Amer: >60 mL/min (ref >60)
Glucose, Bld: 117 mg/dL — ABNORMAL HIGH (ref 65–99)
Potassium: 3.9 mmol/L (ref 3.5–5.1)
Sodium: 136 mmol/L (ref 135–145)

## 2017-01-22 LAB — CBC
HCT: 41.9 % (ref 39.0–52.0)
Hemoglobin: 13.7 g/dL (ref 13.0–17.0)
MCH: 27.3 pg (ref 26.0–34.0)
MCHC: 32.7 g/dL (ref 30.0–36.0)
MCV: 83.6 fL (ref 78.0–100.0)
Platelets: 267 10*3/uL (ref 150–400)
RBC: 5.01 MIL/uL (ref 4.22–5.81)
RDW: 13 % (ref 11.5–15.5)
WBC: 6.8 10*3/uL (ref 4.0–10.5)

## 2017-01-22 NOTE — ED Triage Notes (Signed)
Patient to ED from home c/o lightheadedness - "feeling like he's going to pass out" while sitting. Patient reports hx anxiety and is taking diazepam daily for it. He states he's been dizzy before but this was different. He reports he has bad nerves, but doesn't know if he was hyperventilating at the time (tendency to have panic attacks). Pt states he was working in the yard today and felt it come on, lasted an hour before he got up and then he had a headache. Neuro intact. A&O x 4. PERRLA. Denies dizziness/N/V/fevers.

## 2017-01-23 ENCOUNTER — Emergency Department (HOSPITAL_COMMUNITY)
Admission: EM | Admit: 2017-01-23 | Discharge: 2017-01-23 | Disposition: A | Discharge status: Home / Self Care | Payer: Self-pay | Attending: Emergency Medicine | Admitting: Emergency Medicine

## 2017-01-23 ENCOUNTER — Ambulatory Visit: Payer: Self-pay | Admitting: Diagnostic Neuroimaging

## 2017-01-23 ENCOUNTER — Encounter (HOSPITAL_COMMUNITY): Payer: Self-pay | Admitting: Emergency Medicine

## 2017-01-23 ENCOUNTER — Ambulatory Visit (HOSPITAL_COMMUNITY)
Admission: EM | Admit: 2017-01-23 | Discharge: 2017-01-23 | Disposition: A | Discharge status: Home / Self Care | Payer: Self-pay

## 2017-01-23 DIAGNOSIS — F419 Anxiety disorder, unspecified: Secondary | ICD-10-CM

## 2017-01-23 DIAGNOSIS — R42 Dizziness and giddiness: Secondary | ICD-10-CM

## 2017-01-23 NOTE — ED Triage Notes (Addendum)
[facility] a [facility]iness that is worse when he steps and with head movement. The patient was evaluated for the same complaint of 01/14/2017 and prescribed Meclazine but has not taken it. The patient was also seen in the ED on 01/22/2017 for the same complaint but did not wait to be seen by a provider.

## 2017-01-23 NOTE — Discharge Instructions (Addendum)
Please get established with PCP of your choice for general medical issues. Take meclizine as directed. Go to Er for new or worsening issues(worsening dizziness, chest pain, shortness of breath, etc). Do not smoke,drink or take drugs as this may make your symptoms worse or cause additional medical issues. Pt verbalized understanding to this provider.

## 2017-01-23 NOTE — ED Triage Notes (Signed)
Patient did not answer when called for room 

## 2017-01-23 NOTE — ED Provider Notes (Signed)
CSN: [id number]     Arrival date & time 01/23/17  1811 History   None    Chief Complaint  Patient presents with  . Dizziness   (Consider location/radiation/quality/duration/timing/severity/associated sxs/prior Treatment) 51 yr old hispanic amle presents to ER with cc of dizziness. Pt states he takes Valium and is dizzy, pt denies CP,palpitations, Shortness of breath, N,V, headache or visual changes. Pt has been seen numerous times for similar cc, most recently seen in UC on 01/17/17 (had work up and given script for Meclizine(pt has not filled medication). Pt has not obtained PCP for follow up.    The history is provided by the patient and the spouse. No language interpreter was used.    Past Medical History:  Diagnosis Date  . Alcohol abuse   . Anxiety   . Chronic neck pain   . Cocaine abuse    Past Surgical History:  Procedure Laterality Date  . EYE SURGERY Right ~ 1980   "hit my eye"   Family History  Problem Relation Age of Onset  . Hypertension Mother   . Hypertension Father    Social History  Substance Use Topics  . Smoking status: Never Smoker  . Smokeless tobacco: Never Used  . Alcohol use Yes     Comment: 11/21/2016 "last drank 06/2016"    Review of Systems  Constitutional: Negative for fever.  Respiratory: Negative for shortness of breath.   Cardiovascular: Negative for chest pain and palpitations.  Gastrointestinal: Negative for nausea and vomiting.  Neurological: Positive for dizziness. Negative for headaches.  All other systems reviewed and are negative.   Allergies  Motrin [ibuprofen] and Zofran [ondansetron hcl]  Home Medications   Prior to Admission medications   Medication Sig Start Date End Date Taking? Authorizing Provider  diazepam (VALIUM) 5 MG tablet Take 5 mg by mouth every 12 (twelve) hours as needed for anxiety.   Yes [provider]  omeprazole (PRILOSEC) 40 MG capsule Take 1 capsule (40 mg total) by mouth 2 (two) times daily  before a meal. 01/17/17 01/31/17 Yes Eustace Moore, MD  meclizine (ANTIVERT) 12.5 MG tablet Take 1 tablet (12.5 mg total) by mouth 3 (three) times daily as needed for dizziness. 01/14/17   Luevenia Maxin, Mina A, PA-C  metoCLOPramide (REGLAN) 10 MG tablet Take 1 tablet (10 mg total) by mouth every 8 (eight) hours as needed for nausea. 01/14/17 01/19/17  Michela Pitcher A, PA-C   Meds Ordered and Administered this Visit  Medications - No data to display  BP 127/72 (BP Location: Right Arm)   Pulse 96   Temp 98 F (36.7 C) (Oral)   Resp 18   SpO2 97%  No data found.   Physical Exam  Constitutional: He is oriented to person, place, and time. Vital signs are normal. He appears well-developed and well-nourished. He is active and cooperative.  Non-toxic appearance. He has a sickly appearance. He does not appear ill. No distress.  HENT:  Head: Normocephalic.  Right Ear: Tympanic membrane normal.  Left Ear: Tympanic membrane normal.  Nose: Nose normal.  Mouth/Throat: Uvula is midline, oropharynx is clear and moist and mucous membranes are normal. Mucous membranes are not dry.  Eyes: Conjunctivae, EOM and lids are normal. Pupils are equal, round, and reactive to light.  Neck: Trachea normal and normal range of motion.  Cardiovascular: Normal rate, regular rhythm, normal heart sounds and normal pulses.   Pulmonary/Chest: Effort normal and breath sounds normal.  Abdominal: Soft. Normal appearance. Bowel sounds are  increased. There is no tenderness. There is no rebound and no guarding. No hernia.  Musculoskeletal: Normal range of motion.  Neurological: He is alert and oriented to person, place, and time. He has normal strength. No cranial nerve deficit or sensory deficit. Gait normal. GCS eye subscore is 4. GCS verbal subscore is 5. GCS motor subscore is 6.  Skin: Skin is warm, dry and intact. No rash noted.  Psychiatric: He has a normal mood and affect. His speech is normal and behavior is normal.  Nursing note  and vitals reviewed.   Urgent Care Course     Procedures (including critical care time)  Labs Review Labs Reviewed - No data to display  Imaging Review No results found.      MDM   1. Dizziness   2. Anxiety     Discussed with pt: Please get established with PCP of your choice for general medical issues. Take meclizine as directed. Go to Er for new or worsening issues(worsening dizziness, chest pain, shortness of breath, etc). Do not smoke,drink or take drugs as this may make your symptoms worse or cause additional medical issues. Pt verbalized understanding to this provider.    Clancy Gourdefelice, Lavanya Roa, NP 01/23/17 1958

## 2017-01-24 ENCOUNTER — Encounter: Payer: Self-pay | Admitting: Diagnostic Neuroimaging

## 2017-01-24 ENCOUNTER — Ambulatory Visit (HOSPITAL_COMMUNITY)
Admission: EM | Admit: 2017-01-24 | Discharge: 2017-01-24 | Disposition: A | Discharge status: Home / Self Care | Payer: Self-pay | Attending: Family Medicine | Admitting: Family Medicine

## 2017-01-24 ENCOUNTER — Encounter (HOSPITAL_COMMUNITY): Payer: Self-pay | Admitting: Family Medicine

## 2017-01-24 DIAGNOSIS — F419 Anxiety disorder, unspecified: Secondary | ICD-10-CM

## 2017-01-24 NOTE — ED Triage Notes (Signed)
Pt here for dry mouth, fever, anxiety. Was seen here yesterday for the same.

## 2017-01-24 NOTE — ED Provider Notes (Signed)
[facility]    CSN: [id number] Arrival date & time: 01/24/17  1243     History   Chief Complaint Chief Complaint  Patient presents with  . Fever  . Anxiety    HPI Daylyn Azbill is a 51 y.o. male.   Pt here for dry mouth, fever, anxiety. Was seen here yesterday for the same.   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 He has chronic anxiety and, as a results of his anxiety, dry mouth and palpitations. In addition he has intermittent dizziness for which he takes meclizine, another medicine which dries his mouth out. He's concerned that his blood pressure may be elevated today.      Past Medical History:  Diagnosis Date  . Alcohol abuse   . Anxiety   . Chronic neck pain   . Cocaine abuse     Patient Active Problem List   Diagnosis Date Noted  . Dizziness 01/23/2017  . Back pain 11/27/2016  . Bilateral posterior neck pain 11/27/2016  . Epigastric abdominal pain 11/21/2016  . T wave inversion in EKG 11/21/2016  . Nausea 11/21/2016  . Anxiety 11/21/2016    Past Surgical History:  Procedure Laterality Date  . EYE SURGERY Right ~ 1980   "hit my eye"       Home Medications    Prior to Admission medications   Medication Sig Start Date End Date Taking? Authorizing Provider  diazepam (VALIUM) 5 MG tablet Take 5 mg by mouth every 12 (twelve) hours as needed for anxiety.    [provider]  meclizine (ANTIVERT) 12.5 MG tablet Take 1 tablet (12.5 mg total) by mouth 3 (three) times daily as needed for dizziness. 01/14/17   Luevenia Maxin, Mina A, PA-C  metoCLOPramide (REGLAN) 10 MG tablet Take 1 tablet (10 mg total) by mouth every 8 (eight) hours as needed for nausea. 01/14/17 01/19/17  Michela Pitcher A, PA-C  omeprazole (PRILOSEC) 40 MG capsule Take 1 capsule (40 mg total) by mouth 2 (two) times daily before a meal. 01/17/17 01/31/17  Eustace Moore, MD    Family History Family History  Problem Relation Age of Onset  . Hypertension Mother   . Hypertension Father      Social History Social History  Substance Use Topics  . Smoking status: Never Smoker  . Smokeless tobacco: Never Used  . Alcohol use Yes     Comment: 11/21/2016 "last drank 06/2016"     Allergies   Motrin [ibuprofen] and Zofran [ondansetron hcl]   Review of Systems Review of Systems  All other systems reviewed and are negative.    Physical Exam Triage Vital Signs ED Triage Vitals  Enc Vitals Group     BP      Pulse      Resp      Temp      Temp src      SpO2      Weight      Height      Head Circumference      Peak Flow      Pain Score      Pain Loc      Pain Edu?      Excl. in GC?    No data found.   Updated Vital Signs BP 123/73   Pulse 93   Resp 18   SpO2 98%    Physical Exam  Constitutional: He is oriented to person, place, and time. He appears well-developed and well-nourished.  HENT:  Right  Ear: External ear normal.  Left Ear: External ear normal.  Mouth/Throat: Oropharynx is clear and moist.  Eyes: Conjunctivae and EOM are normal. Pupils are equal, round, and reactive to light.  Neck: Normal range of motion. Neck supple.  Cardiovascular: Normal rate.   Pulmonary/Chest: Effort normal.  Musculoskeletal: Normal range of motion.  Neurological: He is alert and oriented to person, place, and time.  Skin: Skin is warm and dry.  Nursing note and vitals reviewed.    UC Treatments / Results  Labs (all labs ordered are listed, but only abnormal results are displayed) Labs Reviewed - No data to display  EKG  EKG Interpretation None       Radiology No results found.  Procedures Procedures (including critical care time)  Medications Ordered in UC Medications - No data to display   Initial Impression / Assessment and Plan / UC Course  I have reviewed the triage vital signs and the nursing notes.  Pertinent labs & imaging results that were available during my care of the patient were reviewed by me and considered in my medical  decision making (see chart for details).     Final Clinical Impressions(s) / UC Diagnoses   Final diagnoses:  Anxiety    New Prescriptions New Prescriptions   No medications on file  lemon drop lozenges for dry mouth recommended.   Elvina SidleLauenstein, Jessicalynn Deshong, MD 01/24/17 1327

## 2017-01-24 NOTE — Discharge Instructions (Signed)
Favor de chupaba una pastilla de limon para aliviar la lengua seca.  Usted puede comprarla sin receta.  Tiene que pedir Risk analystla farmaciste mostrar donde se encontra las pastillas. La presion arterial es normal hoy.

## 2017-01-25 ENCOUNTER — Ambulatory Visit: Payer: Self-pay | Admitting: Neurology

## 2017-01-25 ENCOUNTER — Telehealth: Payer: Self-pay

## 2017-01-25 NOTE — Telephone Encounter (Signed)
Nikolaus cancelled appt same day, this is his 3rd no show. Ok to dismiss per office no show protocol?

## 2017-01-25 NOTE — Telephone Encounter (Signed)
Please follow office no show policy for new patient, okay to dismiss.

## 2017-01-29 ENCOUNTER — Encounter (HOSPITAL_COMMUNITY): Payer: Self-pay | Admitting: Emergency Medicine

## 2017-01-29 ENCOUNTER — Ambulatory Visit (HOSPITAL_COMMUNITY)
Admission: EM | Admit: 2017-01-29 | Discharge: 2017-01-29 | Disposition: A | Discharge status: Home / Self Care | Payer: Self-pay | Attending: Family Medicine | Admitting: Family Medicine

## 2017-01-29 ENCOUNTER — Emergency Department (HOSPITAL_COMMUNITY)
Admission: EM | Admit: 2017-01-29 | Discharge: 2017-01-29 | Disposition: A | Discharge status: Home / Self Care | Payer: Self-pay | Attending: Emergency Medicine | Admitting: Emergency Medicine

## 2017-01-29 ENCOUNTER — Encounter (HOSPITAL_COMMUNITY): Payer: Self-pay | Admitting: *Deleted

## 2017-01-29 DIAGNOSIS — Z79899 Other long term (current) drug therapy: Secondary | ICD-10-CM | POA: Insufficient documentation

## 2017-01-29 DIAGNOSIS — F419 Anxiety disorder, unspecified: Secondary | ICD-10-CM

## 2017-01-29 DIAGNOSIS — I10 Essential (primary) hypertension: Secondary | ICD-10-CM | POA: Insufficient documentation

## 2017-01-29 DIAGNOSIS — R030 Elevated blood-pressure reading, without diagnosis of hypertension: Secondary | ICD-10-CM

## 2017-01-29 DIAGNOSIS — O161 Unspecified maternal hypertension, first trimester: Secondary | ICD-10-CM

## 2017-01-29 MED ORDER — HYDROCHLOROTHIAZIDE 12.5 MG PO TABS: ORAL_TABLET | ORAL | 0 refills | Status: DC

## 2017-01-29 NOTE — ED Notes (Signed)
Patient called x 2 with no answer 

## 2017-01-29 NOTE — ED Provider Notes (Signed)
CSN: [id number]     Arrival date & time 01/29/17  1009 History   None    Chief Complaint  Patient presents with  . Hypertension   (Consider location/radiation/quality/duration/timing/severity/associated sxs/prior Treatment) Patient c/o worries about his blood pressure.  He states he is worried he has high blood pressure and wants to be on some blood pressure medicine. He also c/o anxiety.  He is taking valium for anxiety prn.  He has about 8 more pills of valium left.   The history is provided by the patient.  Hypertension  This is a new problem. The problem occurs constantly. The problem has not changed since onset.Nothing aggravates the symptoms.    Past Medical History:  Diagnosis Date  . Alcohol abuse   . Anxiety   . Chronic neck pain   . Cocaine abuse    Past Surgical History:  Procedure Laterality Date  . EYE SURGERY Right ~ 1980   "hit my eye"   Family History  Problem Relation Age of Onset  . Hypertension Mother   . Hypertension Father    Social History  Substance Use Topics  . Smoking status: Never Smoker  . Smokeless tobacco: Never Used  . Alcohol use Yes     Comment: 11/21/2016 "last drank 06/2016"    Review of Systems  Constitutional: Negative.   HENT: Negative.   Eyes: Negative.   Respiratory: Negative.   Cardiovascular: Negative.   Gastrointestinal: Negative.   Endocrine: Negative.   Genitourinary: Negative.   Musculoskeletal: Negative.   Allergic/Immunologic: Negative.   Neurological: Negative.   Hematological: Negative.   Psychiatric/Behavioral: Negative.     Allergies  Motrin [ibuprofen] and Zofran [ondansetron hcl]  Home Medications   Prior to Admission medications   Medication Sig Start Date End Date Taking? Authorizing Provider  diazepam (VALIUM) 5 MG tablet Take 5 mg by mouth every 12 (twelve) hours as needed for anxiety.    [provider]  meclizine (ANTIVERT) 12.5 MG tablet Take 1 tablet (12.5 mg total) by mouth 3  (three) times daily as needed for dizziness. 01/14/17   Luevenia MaxinFawze, Mina A, PA-C  metoCLOPramide (REGLAN) 10 MG tablet Take 1 tablet (10 mg total) by mouth every 8 (eight) hours as needed for nausea. 01/14/17 01/19/17  Michela PitcherFawze, Mina A, PA-C  omeprazole (PRILOSEC) 40 MG capsule Take 1 capsule (40 mg total) by mouth 2 (two) times daily before a meal. 01/17/17 01/31/17  Eustace MooreMurray, Laura W, MD   Meds Ordered and Administered this Visit  Medications - No data to display  BP (!) 140/94 (BP Location: Right Arm)   Pulse 97   Temp 98.8 F (37.1 C) (Oral)   Resp 16   SpO2 99%  No data found.   Physical Exam  Constitutional: He is oriented to person, place, and time. He appears well-developed and well-nourished.  HENT:  Head: Normocephalic and atraumatic.  Eyes: Conjunctivae and EOM are normal. Pupils are equal, round, and reactive to light.  Neck: Normal range of motion. Neck supple.  Cardiovascular: Normal rate, regular rhythm and normal heart sounds.   Pulmonary/Chest: Effort normal and breath sounds normal.  Musculoskeletal: Normal range of motion.  Neurological: He is alert and oriented to person, place, and time.  Nursing note and vitals reviewed.   Urgent Care Course     Procedures (including critical care time)  Labs Review Labs Reviewed - No data to display  Imaging Review No results found.   Visual Acuity Review  Right Eye Distance:  Left Eye Distance:   Bilateral Distance:    Right Eye Near:   Left Eye Near:    Bilateral Near:         MDM   1. Elevated blood pressure affecting pregnancy in first trimester, antepartum   2. Anxiety    Referral to [facility] is given and advised patient to get in and get a PCP who can address his elevated BP.  Explained do not see a need for BP medicine at this visit.   Amesti CSR shows he has been getting short acting anti anxiety medicine from 3 different locations/ providers over last 3 months.  Explained he needs to get a  PCP and have this problem addressed and managed.      Deatra Canter, FNP 01/29/17 1144

## 2017-01-29 NOTE — ED Triage Notes (Signed)
Pt has returned and would like to be seen

## 2017-01-29 NOTE — ED Triage Notes (Signed)
Pt did not answer x 3 

## 2017-01-29 NOTE — ED Notes (Signed)
See provider assessment 

## 2017-01-29 NOTE — ED Triage Notes (Addendum)
Pt to ed after being seen at urgent care and discharged-- saw chiropractor this am and was told that he had high blood pressure-- now "nervous"  Pt with multiple visits to ED for same.

## 2017-01-29 NOTE — Discharge Instructions (Signed)
You need to call and get in with [facility] for a PCP who can rx you anxiety medicine and follow up for elevated blood pressure.

## 2017-01-29 NOTE — ED Triage Notes (Signed)
Pt     Reports      Being  Treated   For  Back  Pain  Here  Today  For  bp   evaul        Pt  Reports    Nervous         And  Nausea          decresed   Appetite    Has  A history  Of  Nausea

## 2017-01-29 NOTE — ED Provider Notes (Signed)
[facility]   CSN: [id number] Arrival date & time: 01/29/17  1211  By signing my name below, I, Teofilo PodMatthew P. Jamison, attest that this documentation has been prepared under the direction and in the presence of Langston MaskerKaren Sofia, [place]-C. Electronically Signed: Teofilo PodMatthew P. Jamison, ED Scribe. 01/29/2017. 5:35 PM.    History   Chief Complaint Chief Complaint  Patient presents with  . feel hot all over  . multiple complaints     The history is provided by the patient. No language interpreter was used.   HPI Comments:  Wesley [name] is a 51 y.o. male who presents to the Emergency Department complaining of elevated BP x 1 day. Pt complains of associated chills, neck pain. Pt does not take BP medication. Pt reports taking an anxiety medication regularly. No alleviating factors noted. Pt denies other associated symptoms.   Past Medical History:  Diagnosis Date  . Alcohol abuse   . Anxiety   . Chronic neck pain   . Cocaine abuse     Patient Active Problem List   Diagnosis Date Noted  . Dizziness 01/23/2017  . Back pain 11/27/2016  . Bilateral posterior neck pain 11/27/2016  . Epigastric abdominal pain 11/21/2016  . T wave inversion in EKG 11/21/2016  . Nausea 11/21/2016  . Anxiety 11/21/2016    Past Surgical History:  Procedure Laterality Date  . EYE SURGERY Right ~ 1980   "hit my eye"       Home Medications    Prior to Admission medications   Medication Sig Start Date End Date Taking? Authorizing Provider  diazepam (VALIUM) 5 MG tablet Take 5 mg by mouth every 12 (twelve) hours as needed for anxiety.    [provider]  meclizine (ANTIVERT) 12.5 MG tablet Take 1 tablet (12.5 mg total) by mouth 3 (three) times daily as needed for dizziness. 01/14/17   Luevenia MaxinFawze, Mina A, PA-C  metoCLOPramide (REGLAN) 10 MG tablet Take 1 tablet (10 mg total) by mouth every 8 (eight) hours as needed for nausea. 01/14/17 01/19/17  Michela PitcherFawze, Mina A, PA-C  omeprazole (PRILOSEC) 40  MG capsule Take 1 capsule (40 mg total) by mouth 2 (two) times daily before a meal. 01/17/17 01/31/17  Eustace MooreMurray, Laura W, MD    Family History Family History  Problem Relation Age of Onset  . Hypertension Mother   . Hypertension Father     Social History Social History  Substance Use Topics  . Smoking status: Never Smoker  . Smokeless tobacco: Never Used  . Alcohol use Yes     Comment: 11/21/2016 "last drank 06/2016"     Allergies   Motrin [ibuprofen] and Zofran [ondansetron hcl]   Review of Systems Review of Systems  Constitutional: Positive for chills and fatigue.  Musculoskeletal: Positive for neck pain.  Psychiatric/Behavioral: The patient is nervous/anxious.      Physical Exam Updated Vital Signs BP (!) 133/91 (BP Location: Left Arm)   Pulse 93   Temp 97.8 F (36.6 C) (Oral)   Resp 18   Ht 5\' 6"  (1.676 m)   Wt 180 lb (81.6 kg)   SpO2 98%   BMI 29.05 kg/m   Physical Exam  Constitutional: He appears well-developed and well-nourished. No distress.  HENT:  Head: Normocephalic and atraumatic.  Eyes: Conjunctivae are normal.  Cardiovascular: Normal rate.   Pulmonary/Chest: Effort normal.  Abdominal: He exhibits no distension.  Neurological: He is alert.  Skin: Skin is warm and dry.  Psychiatric: He has a normal mood  and affect.  Nursing note and vitals reviewed.    ED Treatments / Results  DIAGNOSTIC STUDIES:  Oxygen Saturation is 98% on RA, normal by my interpretation.    COORDINATION OF CARE:  5:35 PM  Discussed treatment plan with pt at bedside and pt agreed to plan.   Labs (all labs ordered are listed, but only abnormal results are displayed) Labs Reviewed - No data to display  EKG  EKG Interpretation None       Radiology No results found.  Procedures Procedures (including critical care time)  Medications Ordered in ED Medications - No data to display   Initial Impression / Assessment and Plan / ED Course  I have reviewed the  triage vital signs and the nursing notes.  Pertinent labs & imaging results that were available during my care of the patient were reviewed by me and considered in my medical decision making (see chart for details).       Final Clinical Impressions(s) / ED Diagnoses   Final diagnoses:  Hypertension, unspecified type    New Prescriptions New Prescriptions   No medications on file   No outpatient prescriptions have been marked as taking for the 01/29/17 encounter [facility]).   An After Visit Summary was printed and given to the patient.  I personally performed the services in this documentation, which was scribed in my presence.  The recorded information has been reviewed and considered.   Barnet Pall.   Osie Cheeks 02/06/17 1253    Benjiman Core, MD 02/06/17 [phone]

## 2017-01-29 NOTE — ED Notes (Signed)
Patient called in all areas of waiting room with no answer.  Also checked outside of waiting area on sidewalk.  Patient not found.  Nurse 1st notified.

## 2017-01-30 ENCOUNTER — Ambulatory Visit: Payer: Self-pay | Admitting: Internal Medicine

## 2017-01-31 ENCOUNTER — Encounter (HOSPITAL_COMMUNITY): Payer: Self-pay

## 2017-01-31 ENCOUNTER — Emergency Department (HOSPITAL_COMMUNITY)
Admission: EM | Admit: 2017-01-31 | Discharge: 2017-01-31 | Disposition: A | Discharge status: Home / Self Care | Payer: Self-pay | Attending: Emergency Medicine | Admitting: Emergency Medicine

## 2017-01-31 ENCOUNTER — Encounter: Payer: Self-pay | Admitting: Neurology

## 2017-01-31 DIAGNOSIS — Z79899 Other long term (current) drug therapy: Secondary | ICD-10-CM | POA: Insufficient documentation

## 2017-01-31 DIAGNOSIS — R110 Nausea: Secondary | ICD-10-CM | POA: Insufficient documentation

## 2017-01-31 MED ORDER — SUCRALFATE 1 G PO TABS
1.0000 g | ORAL_TABLET | Freq: Once | ORAL | Status: AC
  Administered 2017-01-31: 1 g via ORAL
  Filled 2017-01-31: qty 1

## 2017-01-31 MED ORDER — FAMOTIDINE 20 MG PO TABS: 20.0000 mg | ORAL_TABLET | Freq: Two times a day (BID) | ORAL | 0 refills | Status: DC

## 2017-01-31 MED ORDER — FAMOTIDINE 20 MG PO TABS
40.0000 mg | ORAL_TABLET | Freq: Once | ORAL | Status: AC
  Administered 2017-01-31: 40 mg via ORAL
  Filled 2017-01-31: qty 2

## 2017-01-31 MED ORDER — GI COCKTAIL ~~LOC~~
30.0000 mL | Freq: Once | ORAL | Status: AC
  Administered 2017-01-31: 30 mL via ORAL
  Filled 2017-01-31: qty 30

## 2017-01-31 MED ORDER — SUCRALFATE 1 G PO TABS: 1.0000 g | ORAL_TABLET | Freq: Four times a day (QID) | ORAL | 0 refills | Status: DC

## 2017-01-31 NOTE — ED Triage Notes (Signed)
Per Pt, Pt is coming from home with complaints of nausea and abdominal pain that started today after not eating yesterday. Denies vomiting or diarrhea.

## 2017-01-31 NOTE — ED Provider Notes (Signed)
[facility]   CSN: [id number] Arrival date & time: 01/31/17  0815     History   Chief Complaint Chief Complaint  Patient presents with  . Nausea    HPI Wesley [name] is a 51 y.o. male.  51 year old male presents with nausea; over several days. Patient has had multiple evaluations for similar symptoms. Most recently about his hospital yesterday as well as according to [place] at the [place] in [place] ago. He states that the hospital in [place], he had abdominal CT which was negative. Since that time he has been having intermittent nausea which she took Reglan which helped somewhat. Denies any vomiting fever or diarrhea. Denies any chest pain or diaphoresis. Of note, patient has had 50 ER visits in the past 6 months.      Past Medical History:  Diagnosis Date  . Alcohol abuse   . Anxiety   . Chronic neck pain   . Cocaine abuse     Patient Active Problem List   Diagnosis Date Noted  . Dizziness 01/23/2017  . Back pain 11/27/2016  . Bilateral posterior neck pain 11/27/2016  . Epigastric abdominal pain 11/21/2016  . T wave inversion in EKG 11/21/2016  . Nausea 11/21/2016  . Anxiety 11/21/2016    Past Surgical History:  Procedure Laterality Date  . EYE SURGERY Right ~ 1980   "hit my eye"       Home Medications    Prior to Admission medications   Medication Sig Start Date End Date Taking? Authorizing Provider  diazepam (VALIUM) 5 MG tablet Take 5 mg by mouth every 12 (twelve) hours as needed for anxiety.   Yes [provider]  metoCLOPramide (REGLAN) 10 MG tablet Take 1 tablet (10 mg total) by mouth every 8 (eight) hours as needed for nausea. 01/14/17 01/31/17 Yes Fawze, Mina A, PA-C  hydrochlorothiazide (HYDRODIURIL) 12.5 MG tablet One a day Patient not taking: Reported on 01/31/2017 01/29/17   Elson AreasSofia, Leslie K, PA-C  meclizine (ANTIVERT) 12.5 MG tablet Take 1 tablet (12.5 mg total) by mouth 3 (three) times daily as needed  for dizziness. Patient not taking: Reported on 01/31/2017 01/14/17   Michela PitcherFawze, Mina A, PA-C  omeprazole (PRILOSEC) 40 MG capsule Take 1 capsule (40 mg total) by mouth 2 (two) times daily before a meal. Patient not taking: Reported on 01/31/2017 01/17/17 01/31/17  Eustace MooreMurray, Laura W, MD    Family History Family History  Problem Relation Age of Onset  . Hypertension Mother   . Hypertension Father     Social History Social History  Substance Use Topics  . Smoking status: Never Smoker  . Smokeless tobacco: Never Used  . Alcohol use Yes     Comment: 11/21/2016 "last drank 06/2016"     Allergies   Motrin [ibuprofen] and Zofran [ondansetron hcl]   Review of Systems Review of Systems  All other systems reviewed and are negative.    Physical Exam Updated Vital Signs BP 131/78 (BP Location: Left Arm)   Pulse 84   Temp 98.6 F (37 C) (Oral)   Resp 18   Ht 1.676 m (5\' 6" )   Wt 81.6 kg (180 lb)   SpO2 99%   BMI 29.05 kg/m   Physical Exam  Constitutional: He is oriented to person, place, and time. He appears well-developed and well-nourished.  Non-toxic appearance. No distress.  HENT:  Head: Normocephalic and atraumatic.  Eyes: Conjunctivae, EOM and lids are normal. Pupils are equal, round, and reactive to light.  Neck: Normal range of motion. Neck supple. No tracheal deviation present. No thyroid mass present.  Cardiovascular: Normal rate, regular rhythm and normal heart sounds.  Exam reveals no gallop.   No murmur heard. Pulmonary/Chest: Effort normal and breath sounds normal. No stridor. No respiratory distress. He has no decreased breath sounds. He has no wheezes. He has no rhonchi. He has no rales.  Abdominal: Soft. Normal appearance and bowel sounds are normal. He exhibits no distension. There is no tenderness. There is no rigidity, no rebound, no guarding and no CVA tenderness.  Musculoskeletal: Normal range of motion. He exhibits no edema or tenderness.  Neurological: He is alert  and oriented to person, place, and time. He has normal strength. No cranial nerve deficit or sensory deficit. GCS eye subscore is 4. GCS verbal subscore is 5. GCS motor subscore is 6.  Skin: Skin is warm and dry. No abrasion and no rash noted.  Psychiatric: He has a normal mood and affect. His speech is normal and behavior is normal.  Nursing note and vitals reviewed.    ED Treatments / Results  Labs (all labs ordered are listed, but only abnormal results are displayed) Labs Reviewed - No data to display  EKG  EKG Interpretation None       Radiology No results found.  Procedures Procedures (including critical care time)  Medications Ordered in ED Medications  gi cocktail (Maalox,Lidocaine,Donnatal) (not administered)  famotidine (PEPCID) tablet 40 mg (not administered)  sucralfate (CARAFATE) tablet 1 g (not administered)     Initial Impression / Assessment and Plan / ED Course  I have reviewed the triage vital signs and the nursing notes.  Pertinent labs & imaging results that were available during my care of the patient were reviewed by me and considered in my medical decision making (see chart for details).     Patient treated with GI cocktail and Carafate and feels better. Suspect he has some component of gastritis. Will discharge home  Final Clinical Impressions(s) / ED Diagnoses   Final diagnoses:  None    New Prescriptions New Prescriptions   No medications on file     Lorre Nick, MD 01/31/17 1051

## 2017-02-02 ENCOUNTER — Encounter: Payer: Self-pay | Admitting: Internal Medicine

## 2017-02-02 ENCOUNTER — Ambulatory Visit: Payer: Self-pay | Attending: Internal Medicine | Admitting: Internal Medicine

## 2017-02-02 VITALS — BP 141/88 | HR 83 | Temp 98.0°F | Resp 16 | Ht 66.0 in | Wt 184.0 lb

## 2017-02-02 DIAGNOSIS — F1911 Other psychoactive substance abuse, in remission: Secondary | ICD-10-CM

## 2017-02-02 DIAGNOSIS — M542 Cervicalgia: Secondary | ICD-10-CM | POA: Insufficient documentation

## 2017-02-02 DIAGNOSIS — R42 Dizziness and giddiness: Secondary | ICD-10-CM | POA: Insufficient documentation

## 2017-02-02 DIAGNOSIS — F419 Anxiety disorder, unspecified: Secondary | ICD-10-CM

## 2017-02-02 DIAGNOSIS — R110 Nausea: Secondary | ICD-10-CM

## 2017-02-02 DIAGNOSIS — I10 Essential (primary) hypertension: Secondary | ICD-10-CM | POA: Insufficient documentation

## 2017-02-02 DIAGNOSIS — R1013 Epigastric pain: Secondary | ICD-10-CM | POA: Insufficient documentation

## 2017-02-02 DIAGNOSIS — Z87898 Personal history of other specified conditions: Secondary | ICD-10-CM

## 2017-02-02 MED ORDER — ESCITALOPRAM OXALATE 10 MG PO TABS: 10.0000 mg | ORAL_TABLET | Freq: Every day | ORAL | 3 refills | Status: DC

## 2017-02-02 MED ORDER — HYDROXYZINE HCL 25 MG PO TABS: 25.0000 mg | ORAL_TABLET | Freq: Two times a day (BID) | ORAL | 1 refills | Status: DC | PRN

## 2017-02-02 NOTE — Patient Instructions (Addendum)
Anxiety:  STOP VALIUM. -start Lexapro and Hydroxizine.

## 2017-02-02 NOTE — Progress Notes (Signed)
Patient ID: Wesley [name], male    DOB: 02-Sep-1966  MRN: [id number]  CC: Establish Care; Dizziness; and Hypertension   Subjective: Wesley [name] is a 51 y.o. male who presents for hosp f/u visit.  Wife,Yanila, is with him. His concerns today include:  Patient with history of anxiety disorder, history of substance abuse for which he states he is currently in remission and frequent ER visits for complaints of anxiety, dizziness, concerns about blood pressure elevation and nausea.  He was seen over 5 times in [facility] ER this month alone.  He is also seen at [facility] ERs with similar complaints.  Seen for nausea and dizziness 01/31/2017.  Also seen at [facility] ER 02/01/2017 with nausea and abdominal discomfort.  -Lipase level, CBC, chem profile and urinalysis were normal. CAT scan of the head was normal.  Gallbladder ultrasound in March of this year was normal -placed on Pepcid and Carafate which he has not taken consistently for failure that they will elevate his blood pressure -Recently started on HCTZ for elevated blood pressure. Review of ER visits show that blood pressure usually looks pretty good  Today:   Concern about his BP and anxiety 1.  Anxiety: This started after he fell off a roof at work last year -He has not worked since then -Reports feeling anxious most of the times -Gets value 5 mg twice a day when necessary prescriptions from various physicians -He has never seen a psychiatrist -Last use of cocaine and alcohol was last year.  2. Nausea: -Feels like his stomach is in a ball. Decreased appetite 1 week. -Denies burning in the stomach, blood in the stools, problems swallowing, or pain with meals  3. Concerned that his blood pressure is high all the time. -Looking over flowsheet for blood pressure with a past 2-3 months, most readings have been okay.  Patient Active Problem List   Diagnosis Date Noted  . Dizziness 01/23/2017  . Back pain 11/27/2016  .  Bilateral posterior neck pain 11/27/2016  . Epigastric abdominal pain 11/21/2016  . T wave inversion in EKG 11/21/2016  . Nausea 11/21/2016  . Anxiety 11/21/2016     Current Outpatient Prescriptions on File Prior to Visit  Medication Sig Dispense Refill  . famotidine (PEPCID) 20 MG tablet Take 1 tablet (20 mg total) by mouth 2 (two) times daily. 30 tablet 0  . hydrochlorothiazide (HYDRODIURIL) 12.5 MG tablet One a day 30 tablet 0  . meclizine (ANTIVERT) 12.5 MG tablet Take 1 tablet (12.5 mg total) by mouth 3 (three) times daily as needed for dizziness. 10 tablet 0  . metoCLOPramide (REGLAN) 10 MG tablet Take 1 tablet (10 mg total) by mouth every 8 (eight) hours as needed for nausea. 15 tablet 0  . sucralfate (CARAFATE) 1 g tablet Take 1 tablet (1 g total) by mouth 4 (four) times daily. (Patient not taking: Reported on 02/02/2017) 30 tablet 0   No current facility-administered medications on file prior to visit.     Allergies  Allergen Reactions  . Motrin [Ibuprofen] Other (See Comments)    Makes his throat get "very dry" after taking it  . Zofran [Ondansetron Hcl] Other (See Comments)    Sweating and body feels bad    Social History   Social History  . Marital status: Significant Other    Spouse name: N/A  . Number of children: N/A  . Years of education: N/A   Occupational History  . Not on file.   Social  History Main Topics  . Smoking status: Never Smoker  . Smokeless tobacco: Never Used  . Alcohol use Yes     Comment: 11/21/2016 "last drank 06/2016"  . Drug use: Yes    Types: Cocaine     Comment: 11/21/2016 Last snorted small amount of cocaine 07-08-16;  . Sexual activity: Not Currently   Other Topics Concern  . Not on file   Social History Narrative  . No narrative on file    Family History  Problem Relation Age of Onset  . Hypertension Mother   . Hypertension Father     Past Surgical History:  Procedure Laterality Date  . EYE SURGERY Right ~ 1980   "hit  my eye"    ROS: Review of Systems  -as stated above  PHYSICAL EXAM: BP (!) 141/88 (BP Location: Right Arm, Patient Position: Sitting, Cuff Size: Large)   Pulse 83   Temp 98 F (36.7 C) (Oral)   Resp 16   Ht 5\' 6"  (1.676 m)   Wt 184 lb (83.5 kg)   SpO2 100%   BMI 29.70 kg/m   Wt Readings from Last 3 Encounters:  02/02/17 184 lb (83.5 kg)  01/31/17 180 lb (81.6 kg)  01/29/17 180 lb (81.6 kg)    Physical Exam General appearance - alert, well appearing, middle age male and in no distress Mental status -pt very talkative.  Answers questions appropriately Mouth - mucous membranes moist, pharynx normal without lesions Neck - supple, no significant adenopathy Chest - clear to auscultation, no wheezes, rales or rhonchi, symmetric air entry Heart - normal rate, regular rhythm, normal S1, S2, no murmurs, rubs, clicks or gallops Abdomen - soft, nontender, nondistended, no masses or organomegaly Extremities - peripheral pulses normal, no pedal edema, no clubbing or cyanosis  GAD 7 : Generalized Anxiety Score 02/02/2017 09/20/2016  Nervous, Anxious, on Edge 3 3  Control/stop worrying 3 0  Worry too much - different things 3 2  Trouble relaxing 3 3  Restless 3 0  Easily annoyed or irritable 3 0  Afraid - awful might happen 3 0  Total GAD 7 Score 21 8  Anxiety Difficulty Extremely difficult -    Depression screen PHQ 2/9 02/02/2017  Decreased Interest 0  Down, Depressed, Hopeless 0  PHQ - 2 Score 0  Altered sleeping 0  Tired, decreased energy 0  Change in appetite 0  Feeling bad or failure about yourself  0  Trouble concentrating 0  Moving slowly or fidgety/restless 0  Suicidal thoughts 0  PHQ-9 Score 0    ASSESSMENT AND PLAN: 1. Anxiety disorder, unspecified type -Stop Valium as it can be habit forming and patient is not getting it from any one particular provider. -He is agreeable to starting Lexapro which she will take daily. I have given hydroxyzine to use twice a day  as needed -We need to get him established with mental health provider.  Patient did not wait to see our LCSW today but we have made him an appointment to come back and see her. - escitalopram (LEXAPRO) 10 MG tablet; Take 1 tablet (10 mg total) by mouth daily.  Dispense: 30 tablet; Refill: 3 - hydrOXYzine (ATARAX/VISTARIL) 25 MG tablet; Take 1 tablet (25 mg total) by mouth 2 (two) times daily as needed for anxiety.  Dispense: 30 tablet; Refill: 1  2. Nausea in adult patient -I think his GI symptoms are related to his anxiety.  Lexapro may help with this if he takes it consistently. -He  will finish the Carafate and Pepcid that he currently has. We tried to do a point-of-care H pylori breath test today but patient left before having it done so we will do it on a future visit - H. pylori breath test; Future  3. Hx of substance abuse - Drug Screen, Urine   Patient was given the opportunity to ask questions.  Patient verbalized understanding of the plan and was able to repeat key elements of the plan.   Orders Placed This Encounter  Procedures  . Drug Screen, Urine  . H. pylori breath test     Requested Prescriptions   Signed Prescriptions Disp Refills  . escitalopram (LEXAPRO) 10 MG tablet 30 tablet 3    Sig: Take 1 tablet (10 mg total) by mouth daily.  . hydrOXYzine (ATARAX/VISTARIL) 25 MG tablet 30 tablet 1    Sig: Take 1 tablet (25 mg total) by mouth 2 (two) times daily as needed for anxiety.    Return in about 1 month (around 03/05/2017).  Jonah Blueeborah Johnson, MD, FACP

## 2017-02-03 ENCOUNTER — Emergency Department (HOSPITAL_COMMUNITY)
Admission: EM | Admit: 2017-02-03 | Discharge: 2017-02-03 | Disposition: A | Discharge status: Home / Self Care | Payer: Self-pay | Attending: Physician Assistant | Admitting: Physician Assistant

## 2017-02-03 ENCOUNTER — Encounter (HOSPITAL_COMMUNITY): Payer: Self-pay | Admitting: *Deleted

## 2017-02-03 DIAGNOSIS — Z79899 Other long term (current) drug therapy: Secondary | ICD-10-CM | POA: Insufficient documentation

## 2017-02-03 DIAGNOSIS — F419 Anxiety disorder, unspecified: Secondary | ICD-10-CM | POA: Insufficient documentation

## 2017-02-03 LAB — DRUG SCREEN, URINE
Amphetamines, Urine: NEGATIVE ng/mL
Barbiturate screen, urine: NEGATIVE ng/mL
Benzodiazepine Quant, Ur: POSITIVE ng/mL
Cannabinoid Quant, Ur: NEGATIVE ng/mL
Cocaine (Metab.): NEGATIVE ng/mL
Opiate Quant, Ur: NEGATIVE ng/mL
PCP Quant, Ur: NEGATIVE ng/mL

## 2017-02-03 MED ORDER — GI COCKTAIL ~~LOC~~
30.0000 mL | Freq: Once | ORAL | Status: AC
  Administered 2017-02-03: 30 mL via ORAL
  Filled 2017-02-03: qty 30

## 2017-02-03 MED ORDER — LORAZEPAM 1 MG PO TABS
0.5000 mg | ORAL_TABLET | Freq: Once | ORAL | Status: AC
  Administered 2017-02-03: 0.5 mg via ORAL
  Filled 2017-02-03: qty 1

## 2017-02-03 MED ORDER — OMEPRAZOLE 20 MG PO CPDR: 20.0000 mg | DELAYED_RELEASE_CAPSULE | Freq: Every day | ORAL | 0 refills | Status: DC

## 2017-02-03 NOTE — ED Provider Notes (Signed)
[facility]-EMER[facility]ENCY DEPT Provider Note   CSN: [id number] Arrival date & time: 02/03/17  [id number]     History   Chief Complaint Chief Complaint  Patient presents with  . Anxiety    HPI Wesley [name] is a 51 y.o. male.  HPI   Patient is a [age] year old male with history of anxiety and abdominal pain. He's been here 50 times in the last 6 months. For the same complaints. Patient reports that he stop drinking. He wakes up every morning with mild epigastric pain. He reports having a primary care physician.  Past Medical History:  Diagnosis Date  . Alcohol abuse   . Anxiety   . Chronic neck pain   . Cocaine abuse     Patient Active Problem List   Diagnosis Date Noted  . Dizziness 01/23/2017  . Back pain 11/27/2016  . Bilateral posterior neck pain 11/27/2016  . Epigastric abdominal pain 11/21/2016  . T wave inversion in EKG 11/21/2016  . Nausea 11/21/2016  . Anxiety 11/21/2016    Past Surgical History:  Procedure Laterality Date  . EYE SURGERY Right ~ 1980   "hit my eye"       Home Medications    Prior to Admission medications   Medication Sig Start Date End Date Taking? Authorizing Provider  escitalopram (LEXAPRO) 10 MG tablet Take 1 tablet (10 mg total) by mouth daily. 02/02/17   Marcine MatarJohnson, Deborah B, MD  famotidine (PEPCID) 20 MG tablet Take 1 tablet (20 mg total) by mouth 2 (two) times daily. 01/31/17   Lorre NickAllen, Anthony, MD  hydrochlorothiazide (HYDRODIURIL) 12.5 MG tablet One a day 01/29/17   Elson AreasSofia, Leslie K, PA-C  hydrOXYzine (ATARAX/VISTARIL) 25 MG tablet Take 1 tablet (25 mg total) by mouth 2 (two) times daily as needed for anxiety. 02/02/17   Marcine MatarJohnson, Deborah B, MD  meclizine (ANTIVERT) 12.5 MG tablet Take 1 tablet (12.5 mg total) by mouth 3 (three) times daily as needed for dizziness. 01/14/17   Luevenia MaxinFawze, Mina A, PA-C  metoCLOPramide (REGLAN) 10 MG tablet Take 1 tablet (10 mg total) by mouth every 8 (eight) hours as needed for nausea. 01/14/17 01/31/17  Michela PitcherFawze, Mina A, PA-C    sucralfate (CARAFATE) 1 g tablet Take 1 tablet (1 g total) by mouth 4 (four) times daily. Patient not taking: Reported on 02/02/2017 01/31/17   Lorre NickAllen, Anthony, MD    Family History Family History  Problem Relation Age of Onset  . Hypertension Mother   . Hypertension Father     Social History Social History  Substance Use Topics  . Smoking status: Never Smoker  . Smokeless tobacco: Never Used  . Alcohol use Yes     Comment: 11/21/2016 "last drank 06/2016"     Allergies   Motrin [ibuprofen] and Zofran [ondansetron hcl]   Review of Systems Review of Systems  Constitutional: Negative for activity change.  Respiratory: Negative for shortness of breath.   Cardiovascular: Negative for chest pain.  Gastrointestinal: Positive for abdominal pain.  Psychiatric/Behavioral: The patient is nervous/anxious.      Physical Exam Updated Vital Signs BP (!) 137/91 (BP Location: Left Arm)   Pulse 89   Temp 97.8 F (36.6 C) (Oral)   Resp 14   Ht 5\' 6"  (1.676 m)   Wt 81.6 kg (180 lb)   SpO2 98%   BMI 29.05 kg/m   Physical Exam  Constitutional: He is oriented to person, place, and time. He appears well-nourished.  HENT:  Head: Normocephalic.  Eyes: Conjunctivae are normal.  Cardiovascular:  Normal rate.   Pulmonary/Chest: Effort normal and breath sounds normal.  Abdominal: He exhibits no distension. There is no tenderness.  Neurological: He is oriented to person, place, and time.  Skin: Skin is warm and dry. He is not diaphoretic.  Psychiatric: His behavior is normal.  Mildly anxious.     ED Treatments / Results  Labs (all labs ordered are listed, but only abnormal results are displayed) Labs Reviewed - No data to display  EKG  EKG Interpretation None       Radiology No results found.  Procedures Procedures (including critical care time)  Medications Ordered in ED Medications  gi cocktail (Maalox,Lidocaine,Donnatal) (not administered)  LORazepam (ATIVAN)  tablet 0.5 mg (not administered)     Initial Impression / Assessment and Plan / ED Course  I have reviewed the triage vital signs and the nursing notes.  Pertinent labs & imaging results that were available during my care of the patient were reviewed by me and considered in my medical decision making (see chart for details).     Patient is a 51 year old male with history of alcohol abuse presenting today with epigastric pain and anxiety. Patient presents every when he feels this way and he feels shaky as well. He denies drinking any more alcohol though the symptoms sound consistent with withdrawal. We will give him 0.5 mg of Ativan and encouraged him to take omeprazole. We'll write a prescription again for him. Patient's been here 50 times the last 6 months. We'll also refer him to primary care physician again.  Final Clinical Impressions(s) / ED Diagnoses   Final diagnoses:  None    New Prescriptions New Prescriptions   No medications on file     Abelino Derrick, MD 02/04/17 1043

## 2017-02-03 NOTE — ED Notes (Signed)
Pt is in stable condition upon d/c and ambulates from ED. 

## 2017-02-03 NOTE — ED Triage Notes (Signed)
Pt reports "feeling anxious today" and feels like bp is elevated. bp is 137/91 at triage and no distress is noted.

## 2017-02-03 NOTE — Discharge Instructions (Signed)
To find a primary care or specialty doctor please call [phone] or [phone] to access "[facility] Find a Doctor Service." ° °You may also go on the [facility] website at [email or web address] ° °There are also multiple [facility] practices throughout the [place] that are frequently accepting new patients. You may find a clinic that is close to your home and contact them. ° °[facility] -  °[address] °[place] [address] °[phone] ° °[facility] in [place] (also locations in [place] and [place]) -  °[address] °[place] [address] °[phone] ° °[name] -  °[address] °[place] [address] °[phone] ° ° °

## 2017-02-04 ENCOUNTER — Emergency Department (HOSPITAL_COMMUNITY)
Admission: EM | Admit: 2017-02-04 | Discharge: 2017-02-04 | Disposition: A | Discharge status: Home / Self Care | Payer: Self-pay | Attending: Emergency Medicine | Admitting: Emergency Medicine

## 2017-02-04 ENCOUNTER — Encounter (HOSPITAL_COMMUNITY): Payer: Self-pay

## 2017-02-04 ENCOUNTER — Emergency Department (HOSPITAL_COMMUNITY)
Admission: EM | Admit: 2017-02-04 | Discharge: 2017-02-05 | Disposition: A | Discharge status: Home / Self Care | Payer: Self-pay | Attending: Dermatology | Admitting: Dermatology

## 2017-02-04 DIAGNOSIS — F419 Anxiety disorder, unspecified: Secondary | ICD-10-CM | POA: Insufficient documentation

## 2017-02-04 DIAGNOSIS — I10 Essential (primary) hypertension: Secondary | ICD-10-CM | POA: Insufficient documentation

## 2017-02-04 DIAGNOSIS — Z5321 Procedure and treatment not carried out due to patient leaving prior to being seen by health care provider: Secondary | ICD-10-CM | POA: Insufficient documentation

## 2017-02-04 MED ORDER — METOCLOPRAMIDE HCL 10 MG PO TABS
10.0000 mg | ORAL_TABLET | Freq: Once | ORAL | Status: AC
  Administered 2017-02-04: 10 mg via ORAL
  Filled 2017-02-04: qty 1

## 2017-02-04 NOTE — Discharge Instructions (Signed)
It was my pleasure taking care of you today!   Please quit taking Valium and start taking your hydroxyzine as needed for your anxiety. This is the medication your primary care provider recommended you take for your anxiety. I would also like you to follow up with your primary care provider for further discussion of your hospital visit today. Return to ER for new or worsening symptoms, any additional concerns.

## 2017-02-04 NOTE — ED Triage Notes (Signed)
Pt presents to the ed with complaints of feeling like his blood pressure is elevated. Stated he took his medication and his head felt hot so he wanted to come get it checked, bp wnl in triage, denies any other complaints

## 2017-02-04 NOTE — ED Notes (Signed)
Went to get pt to place in a room and pt stated his bp was fine and that he was leaving.

## 2017-02-04 NOTE — ED Triage Notes (Signed)
Patient here requesting BP check and states that he feels dizzy following his valium this am. BP normal and no neuro deficits. NAD

## 2017-02-04 NOTE — ED Provider Notes (Signed)
[facility] Provider Note   CSN: [id number] Arrival date & time: 02/04/17  [id number]     History   Chief Complaint No chief complaint on file.   HPI Wesley [name] is a 51 y.o. male.  The history is provided by the patient and medical records. No language interpreter was used.   Wesley [name] is a 51 y.o. male who presents to the Emergency Department complaining of dizziness which occurred this morning about 20 minutes after taking Valium and has now resolved. He states that he woke up this morning and was feeling very anxious which is why he took Valium. PCP told him not to take this medication anymore and instead take new medication (hydroxyzine), however he said he still had two of these pills left and know they work. He will occasionally feel dizzy shortly after taking this medication and states sometimes when he feels this way, his blood pressure is high. He came in today for blood pressure check. He is having no headaches, visual changes, chest pain or abdominal pain. He is slightly nauseous, but no vomiting or diarrhea. Normal bowel movements with no blood in the stool. Again, dizziness now resolved.   Past Medical History:  Diagnosis Date  . Alcohol abuse   . Anxiety   . Chronic neck pain   . Cocaine abuse     Patient Active Problem List   Diagnosis Date Noted  . Dizziness 01/23/2017  . Back pain 11/27/2016  . Bilateral posterior neck pain 11/27/2016  . Epigastric abdominal pain 11/21/2016  . T wave inversion in EKG 11/21/2016  . Nausea 11/21/2016  . Anxiety 11/21/2016    Past Surgical History:  Procedure Laterality Date  . EYE SURGERY Right ~ 1980   "hit my eye"       Home Medications    Prior to Admission medications   Medication Sig Start Date End Date Taking? Authorizing Provider  diazepam (VALIUM) 5 MG tablet Take 5 mg by mouth daily.   Yes [provider]  hydrochlorothiazide (HYDRODIURIL) 12.5 MG tablet One a day Patient taking  differently: Take 12.5 mg by mouth daily.  01/29/17  Yes Cheron SchaumannSofia, Leslie K, PA-C  hydrOXYzine (ATARAX/VISTARIL) 25 MG tablet Take 1 tablet (25 mg total) by mouth 2 (two) times daily as needed for anxiety. 02/02/17  Yes Marcine MatarJohnson, Deborah B, MD  meclizine (ANTIVERT) 12.5 MG tablet Take 1 tablet (12.5 mg total) by mouth 3 (three) times daily as needed for dizziness. 01/14/17  Yes Fawze, Mina A, PA-C  metoCLOPramide (REGLAN) 10 MG tablet Take 1 tablet (10 mg total) by mouth every 8 (eight) hours as needed for nausea. 01/14/17 02/04/17 Yes Fawze, Mina A, PA-C  escitalopram (LEXAPRO) 10 MG tablet Take 1 tablet (10 mg total) by mouth daily. Patient not taking: Reported on 02/04/2017 02/02/17   Marcine MatarJohnson, Deborah B, MD  famotidine (PEPCID) 20 MG tablet Take 1 tablet (20 mg total) by mouth 2 (two) times daily. Patient not taking: Reported on 02/04/2017 01/31/17   Lorre NickAllen, Anthony, MD  omeprazole (PRILOSEC) 20 MG capsule Take 1 capsule (20 mg total) by mouth daily. Patient not taking: Reported on 02/04/2017 02/03/17   Mackuen, Courteney Lyn, MD  sucralfate (CARAFATE) 1 g tablet Take 1 tablet (1 g total) by mouth 4 (four) times daily. Patient not taking: Reported on 02/02/2017 01/31/17   Lorre NickAllen, Anthony, MD    Family History Family History  Problem Relation Age of Onset  . Hypertension Mother   . Hypertension Father  Social History Social History  Substance Use Topics  . Smoking status: Never Smoker  . Smokeless tobacco: Never Used  . Alcohol use Yes     Comment: 11/21/2016 "last drank 06/2016"     Allergies   Motrin [ibuprofen] and Zofran [ondansetron hcl]   Review of Systems Review of Systems  Gastrointestinal: Positive for nausea.  Neurological: Positive for dizziness (Resolved).  All other systems reviewed and are negative.    Physical Exam Updated Vital Signs BP 116/86   Pulse 84   Temp 98.5 F (36.9 C) (Oral)   Resp (!) 22   Ht 5\' 6"  (1.676 m)   Wt 81.6 kg (180 lb)   SpO2 100%   BMI 29.05  kg/m   Physical Exam  Constitutional: He is oriented to person, place, and time. He appears well-developed and well-nourished. No distress.  HENT:  Head: Normocephalic and atraumatic.  Cardiovascular: Normal rate, regular rhythm and normal heart sounds.   No murmur heard. Pulmonary/Chest: Effort normal and breath sounds normal. No respiratory distress.  Abdominal: Soft. Bowel sounds are normal. He exhibits no distension.  No abdominal tenderness.  Musculoskeletal: He exhibits no edema.  Neurological: He is alert and oriented to person, place, and time.  Speech clear and goal oriented. CN 2-12 grossly intact. Normal finger-to-nose and rapid alternating movements. No drift. Strength and sensation intact. Steady gait.   Skin: Skin is warm and dry.  Nursing note and vitals reviewed.    ED Treatments / Results  Labs (all labs ordered are listed, but only abnormal results are displayed) Labs Reviewed - No data to display  EKG  EKG Interpretation None       Radiology No results found.  Procedures Procedures (including critical care time)  Medications Ordered in ED Medications  metoCLOPramide (REGLAN) tablet 10 mg (10 mg Oral Given 02/04/17 1050)     Initial Impression / Assessment and Plan / ED Course  I have reviewed the triage vital signs and the nursing notes.  Pertinent labs & imaging results that were available during my care of the patient were reviewed by me and considered in my medical decision making (see chart for details).    Wesley [name] is a 51 y.o. male who presents to ED for concerns of elevated blood pressure. BP 130/98. Patient took valium and shortly after felt dizzy. Dizziness now resolved and patient with no focal neuro deficits on exam. Steady gait. Normal finger-to-nose and rapid alternating movements. He states that he will sometimes feel dizzy when his blood pressure is elevated, therefore wanted to come to ED to ensure he was not hypertensive. Seen  in ED multiple times for similar. His PCP has recently told him to quit taking valium and gave him rx for hydroxyzine PRN anxiety. Stressed the importance of PCP follow up and adhering to her medication recommendations. Reasons to return to ER discussed and all questions answered.   Final Clinical Impressions(s) / ED Diagnoses   Final diagnoses:  Anxiety    New Prescriptions Discharge Medication List as of 02/04/2017 10:44 AM       Ward, Chase Picket, PA-C 02/04/17 1129    Raeford Razor, MD 02/05/17 [phone]

## 2017-02-05 ENCOUNTER — Emergency Department (HOSPITAL_COMMUNITY)
Admission: EM | Admit: 2017-02-05 | Discharge: 2017-02-05 | Disposition: A | Discharge status: Home / Self Care | Payer: Self-pay | Attending: Emergency Medicine | Admitting: Emergency Medicine

## 2017-02-05 DIAGNOSIS — Z79899 Other long term (current) drug therapy: Secondary | ICD-10-CM | POA: Insufficient documentation

## 2017-02-05 DIAGNOSIS — F418 Other specified anxiety disorders: Secondary | ICD-10-CM

## 2017-02-05 DIAGNOSIS — I10 Essential (primary) hypertension: Secondary | ICD-10-CM | POA: Insufficient documentation

## 2017-02-05 DIAGNOSIS — F419 Anxiety disorder, unspecified: Secondary | ICD-10-CM | POA: Insufficient documentation

## 2017-02-05 LAB — COMPREHENSIVE METABOLIC PANEL
ALT: 18 U/L (ref 17–63)
AST: 21 U/L (ref 15–41)
Albumin: 4.5 g/dL (ref 3.5–5.0)
Alkaline Phosphatase: 78 U/L (ref 38–126)
Anion gap: 11 (ref 5–15)
BUN: 10 mg/dL (ref 6–20)
CO2: 22 mmol/L (ref 22–32)
Calcium: 9.8 mg/dL (ref 8.9–10.3)
Chloride: 101 mmol/L (ref 101–111)
Creatinine, Ser: 0.77 mg/dL (ref 0.61–1.24)
GFR calc Af Amer: >60 mL/min (ref >60)
GFR calc non Af Amer: >60 mL/min (ref >60)
Glucose, Bld: 99 mg/dL (ref 65–99)
Potassium: 4 mmol/L (ref 3.5–5.1)
Sodium: 134 mmol/L — ABNORMAL LOW (ref 135–145)
Total Bilirubin: 0.4 mg/dL (ref 0.3–1.2)
Total Protein: 8.3 g/dL — ABNORMAL HIGH (ref 6.5–8.1)

## 2017-02-05 LAB — CBC WITH DIFFERENTIAL/PLATELET
Basophils Absolute: 0 10*3/uL (ref 0.0–0.1)
Basophils Relative: 0 %
Eosinophils Absolute: 0.2 10*3/uL (ref 0.0–0.7)
Eosinophils Relative: 3 %
HCT: 44.4 % (ref 39.0–52.0)
Hemoglobin: 14.7 g/dL (ref 13.0–17.0)
Lymphocytes Relative: 36 %
Lymphs Abs: 3.1 10*3/uL (ref 0.7–4.0)
MCH: 27.8 pg (ref 26.0–34.0)
MCHC: 33.1 g/dL (ref 30.0–36.0)
MCV: 84.1 fL (ref 78.0–100.0)
Monocytes Absolute: 0.5 10*3/uL (ref 0.1–1.0)
Monocytes Relative: 6 %
Neutro Abs: 4.7 10*3/uL (ref 1.7–7.7)
Neutrophils Relative %: 55 %
Platelets: 262 10*3/uL (ref 150–400)
RBC: 5.28 MIL/uL (ref 4.22–5.81)
RDW: 13.2 % (ref 11.5–15.5)
WBC: 8.4 10*3/uL (ref 4.0–10.5)

## 2017-02-05 NOTE — ED Notes (Signed)
Pt returned and sts that he went to his car; pt encouraged to stay in waiting room

## 2017-02-05 NOTE — ED Notes (Signed)
Pt ambulated to room from waiting room, tolerated well. 

## 2017-02-05 NOTE — ED Provider Notes (Signed)
[facility] DEPT Provider Note   CSN: [id number] Arrival date & time: 02/05/17  1253     History   Chief Complaint Chief Complaint  Patient presents with  . Dizziness    HPI Wesley [name] is a 51 y.o. male.  Patient with past medical history of anxiety, presents with sudden onset dizziness. Patient with 12 ED visits this month, today will be visit #13. Patient presenting with similar symptoms as previous visits, reports anxiety with associated dizziness & stomach upset. Denies vision changes, weakness, headache, slurring of speech, chest pain, shortness of breath. Patient recently seen at [facility] health and wellness, started on Lexapro and hydroxyzine, Valium discontinued. Patient also on HCTZ for HTN, patient reports he is worried this medication is causing his symptoms. Reports he did not take his anxiety medicine today as he was "feeling nervous" and worried at this medication would make him feel worse.       Past Medical History:  Diagnosis Date  . Alcohol abuse   . Anxiety   . Chronic neck pain   . Cocaine abuse   . Hypertension     Patient Active Problem List   Diagnosis Date Noted  . Dizziness 01/23/2017  . Back pain 11/27/2016  . Bilateral posterior neck pain 11/27/2016  . Epigastric abdominal pain 11/21/2016  . T wave inversion in EKG 11/21/2016  . Nausea 11/21/2016  . Anxiety 11/21/2016    Past Surgical History:  Procedure Laterality Date  . EYE SURGERY Right ~ 1980   "hit my eye"       Home Medications    Prior to Admission medications   Medication Sig Start Date End Date Taking? Authorizing Provider  diazepam (VALIUM) 5 MG tablet Take 5 mg by mouth daily.    [provider]  escitalopram (LEXAPRO) 10 MG tablet Take 1 tablet (10 mg total) by mouth daily. Patient not taking: Reported on 02/04/2017 02/02/17   Marcine Matar, MD  famotidine (PEPCID) 20 MG tablet Take 1 tablet (20 mg total) by mouth 2 (two) times daily. Patient not taking:  Reported on 02/04/2017 01/31/17   Lorre Nick, MD  hydrochlorothiazide (HYDRODIURIL) 12.5 MG tablet One a day Patient taking differently: Take 12.5 mg by mouth daily.  01/29/17   Elson Areas, PA-C  hydrOXYzine (ATARAX/VISTARIL) 25 MG tablet Take 1 tablet (25 mg total) by mouth 2 (two) times daily as needed for anxiety. 02/02/17   Marcine Matar, MD  meclizine (ANTIVERT) 12.5 MG tablet Take 1 tablet (12.5 mg total) by mouth 3 (three) times daily as needed for dizziness. 01/14/17   Luevenia Maxin, Mina A, PA-C  metoCLOPramide (REGLAN) 10 MG tablet Take 1 tablet (10 mg total) by mouth every 8 (eight) hours as needed for nausea. 01/14/17 02/04/17  Michela Pitcher A, PA-C  omeprazole (PRILOSEC) 20 MG capsule Take 1 capsule (20 mg total) by mouth daily. Patient not taking: Reported on 02/04/2017 02/03/17   Mackuen, Courteney Lyn, MD  sucralfate (CARAFATE) 1 g tablet Take 1 tablet (1 g total) by mouth 4 (four) times daily. Patient not taking: Reported on 02/02/2017 01/31/17   Lorre Nick, MD    Family History Family History  Problem Relation Age of Onset  . Hypertension Mother   . Hypertension Father     Social History Social History  Substance Use Topics  . Smoking status: Never Smoker  . Smokeless tobacco: Never Used  . Alcohol use Yes     Comment: 11/21/2016 "last drank 06/2016"  Allergies   Motrin [ibuprofen] and Zofran [ondansetron hcl]   Review of Systems Review of Systems  Eyes: Negative for visual disturbance.  Respiratory: Negative for shortness of breath.   Cardiovascular: Negative for chest pain.  Gastrointestinal: Positive for nausea.  Neurological: Positive for dizziness. Negative for speech difficulty, weakness and headaches.  Psychiatric/Behavioral: The patient is nervous/anxious.      Physical Exam Updated Vital Signs BP 113/77 (BP Location: Right Arm)   Pulse 86   Temp 97.9 F (36.6 C) (Oral)   Resp (!) 26   Ht 5\' 6"  (1.676 m)   Wt 81.6 kg (180 lb)   SpO2 98%    BMI 29.05 kg/m   Physical Exam  Constitutional: He is oriented to person, place, and time. He appears well-developed and well-nourished. No distress.  Patient well-appearing, poor hygiene.  HENT:  Head: Normocephalic and atraumatic.  Mouth/Throat: Oropharynx is clear and moist.  Eyes: Conjunctivae and EOM are normal. Pupils are equal, round, and reactive to light.  Neck: Normal range of motion.  Cardiovascular: Normal rate, regular rhythm, normal heart sounds and intact distal pulses.  Exam reveals no friction rub.   No murmur heard. Pulmonary/Chest: Effort normal and breath sounds normal. No respiratory distress. He has no wheezes. He has no rales.  Abdominal: Soft. Bowel sounds are normal. He exhibits no distension. There is no tenderness. There is no rebound and no guarding.  Musculoskeletal: Normal range of motion.  Neurological: He is alert and oriented to person, place, and time. He displays normal reflexes. No cranial nerve deficit or sensory deficit. He exhibits normal muscle tone. Coordination normal.  Psychiatric: His behavior is normal. His mood appears anxious.  Nursing note and vitals reviewed.    ED Treatments / Results  Labs (all labs ordered are listed, but only abnormal results are displayed) Labs Reviewed  COMPREHENSIVE METABOLIC PANEL - Abnormal; Notable for the following:       Result Value   Sodium 134 (*)    Total Protein 8.3 (*)    All other components within normal limits  CBC WITH DIFFERENTIAL/PLATELET    EKG  EKG Interpretation None       Radiology No results found.  Procedures Procedures (including critical care time)  Medications Ordered in ED Medications - No data to display   Initial Impression / Assessment and Plan / ED Course  I have reviewed the triage vital signs and the nursing notes.  Pertinent labs & imaging results that were available during my care of the patient were reviewed by me and considered in my medical decision  making (see chart for details).     Patient presents to the emergency department complaining of symptoms consistent with anxiety. Pt w 12 ED visits this month w similar complaint.  The patient is resting comfortably, in no apparent distress.  Labs, ECG and vital signs reviewed.  No exophthalmos. Normal neuro exam. Stress reducing mechanisms discussed including caffeine intake. Encouraged patient to call his PCP tomorrow to address concerns about his medication for hypertension. Also encouraged patient to take his anxiety medicine as prescribed, as this will help him feel less nervous. Patient safe for discharge home.  Patient discussed with Dr. Silverio LayYao. Discussed results, findings, treatment and follow up. Patient advised of return precautions. Patient verbalized understanding and agreed with plan.   Final Clinical Impressions(s) / ED Diagnoses   Final diagnoses:  Anxiety about health    New Prescriptions New Prescriptions   No medications on file  Russo, [place] N, PA-C 02/05/17 1729    Charlynne Pander, MD 02/05/17 2012

## 2017-02-05 NOTE — ED Triage Notes (Signed)
Pt in c/o of dizziness post taking HCTZ 125 mg daily for HTN, reports taking med for the first time today, denies pain, pt hypertensive in triage with HR 106, pt A&O x4

## 2017-02-05 NOTE — Discharge Instructions (Signed)
Please read instructions below. Take your anxiety medicine as prescribed, it will help you feel better. Call your primary care provider tomorrow for your concerns about your medications.  Avoid caffeine and drinks high in sugar as this can add to your anxiety.

## 2017-02-05 NOTE — ED Notes (Signed)
Pt called multiple times to have vitals reassessed. No answer. Pt being moved OTF. Shanda BumpsJessica - RN aware.

## 2017-02-06 ENCOUNTER — Ambulatory Visit (HOSPITAL_COMMUNITY)
Admission: EM | Admit: 2017-02-06 | Discharge: 2017-02-06 | Disposition: A | Discharge status: Home / Self Care | Payer: Self-pay | Attending: Family Medicine | Admitting: Family Medicine

## 2017-02-06 ENCOUNTER — Encounter (HOSPITAL_COMMUNITY): Payer: Self-pay | Admitting: Emergency Medicine

## 2017-02-06 DIAGNOSIS — Z765 Malingerer [conscious simulation]: Secondary | ICD-10-CM

## 2017-02-06 DIAGNOSIS — F418 Other specified anxiety disorders: Secondary | ICD-10-CM

## 2017-02-06 NOTE — Discharge Instructions (Signed)
For your signs, symptoms, and history, we are unable to fully address these here in the urgent care, you need a primary care provider to manage your health. Please follow-up with community health and wellness.

## 2017-02-06 NOTE — ED Triage Notes (Signed)
Patient is in department with complaints of dizziness and sleepiness.  Patient has handed me a pill bottle of hydroxyzine.  Patient started this medicine today.  Medicine intended for anxiety

## 2017-02-06 NOTE — ED Provider Notes (Signed)
CSN: [id number]     Arrival date & time 02/06/17  1413 History   First MD Initiated Contact with Patient 02/06/17 1605     Chief Complaint  Patient presents with  . Dizziness   (Consider location/radiation/quality/duration/timing/severity/associated sxs/prior Treatment) 51 year old male presents to clinic with a chief complaint of being sleepy. Patient has been seen in this clinic approximately 9 times and the last 30 days, and while reviewing history, he is [facility] nearly [facility] department for various complaints. Most recently, he was started on a medicine called hydroxyzine took his first dose today, and now he is feeling tired. He has no other complaints at this time.    Dizziness  Associated symptoms: no headaches     Past Medical History:  Diagnosis Date  . Alcohol abuse   . Anxiety   . Chronic neck pain   . Cocaine abuse   . Hypertension    Past Surgical History:  Procedure Laterality Date  . EYE SURGERY Right ~ 1980   "hit my eye"   Family History  Problem Relation Age of Onset  . Hypertension Mother   . Hypertension Father    Social History  Substance Use Topics  . Smoking status: Never Smoker  . Smokeless tobacco: Never Used  . Alcohol use Yes     Comment: 11/21/2016 "last drank 06/2016"    Review of Systems  Constitutional: Negative.   HENT: Negative.   Respiratory: Negative.   Cardiovascular: Negative.   Gastrointestinal: Negative.   Skin: Negative.   Neurological: Positive for dizziness. Negative for light-headedness and headaches.    Allergies  Motrin [ibuprofen] and Zofran [ondansetron hcl]  Home Medications   Prior to Admission medications   Medication Sig Start Date End Date Taking? Authorizing Provider  diazepam (VALIUM) 5 MG tablet Take 5 mg by mouth daily.    [provider]  escitalopram (LEXAPRO) 10 MG tablet Take 1 tablet (10 mg total) by mouth daily. Patient not taking: Reported on 02/04/2017 02/02/17    Marcine MatarJohnson, Deborah B, MD  famotidine (PEPCID) 20 MG tablet Take 1 tablet (20 mg total) by mouth 2 (two) times daily. Patient not taking: Reported on 02/04/2017 01/31/17   Lorre NickAllen, Anthony, MD  hydrochlorothiazide (HYDRODIURIL) 12.5 MG tablet One a day Patient taking differently: Take 12.5 mg by mouth daily.  01/29/17   Elson AreasSofia, Leslie K, PA-C  hydrOXYzine (ATARAX/VISTARIL) 25 MG tablet Take 1 tablet (25 mg total) by mouth 2 (two) times daily as needed for anxiety. 02/02/17   Marcine MatarJohnson, Deborah B, MD  meclizine (ANTIVERT) 12.5 MG tablet Take 1 tablet (12.5 mg total) by mouth 3 (three) times daily as needed for dizziness. 01/14/17   Luevenia MaxinFawze, Mina A, PA-C  metoCLOPramide (REGLAN) 10 MG tablet Take 1 tablet (10 mg total) by mouth every 8 (eight) hours as needed for nausea. 01/14/17 02/04/17  Michela PitcherFawze, Mina A, PA-C  omeprazole (PRILOSEC) 20 MG capsule Take 1 capsule (20 mg total) by mouth daily. Patient not taking: Reported on 02/04/2017 02/03/17   Mackuen, Courteney Lyn, MD  sucralfate (CARAFATE) 1 g tablet Take 1 tablet (1 g total) by mouth 4 (four) times daily. Patient not taking: Reported on 02/02/2017 01/31/17   Lorre NickAllen, Anthony, MD   Meds Ordered and Administered this Visit  Medications - No data to display  BP 126/74 (BP Location: Left Arm)   Pulse 91   Temp 98.4 F (36.9 C) (Oral)   Resp 16   SpO2 100%  No data found.  Physical Exam  Constitutional: He is oriented to person, place, and time. He appears well-developed and well-nourished. No distress.  HENT:  Head: Normocephalic and atraumatic.  Right Ear: External ear normal.  Left Ear: External ear normal.  Eyes: Conjunctivae are normal.  Cardiovascular: Normal rate and regular rhythm.   Pulmonary/Chest: Effort normal and breath sounds normal.  Neurological: He is alert and oriented to person, place, and time.  Skin: Skin is warm and dry. Capillary refill takes less than 2 seconds. He is not diaphoretic.  Psychiatric: He has a normal mood and affect. His  behavior is normal.  Nursing note and vitals reviewed.   Urgent Care Course     Procedures (including critical care time)  Labs Review Labs Reviewed - No data to display  Imaging Review No results found.   Visual Acuity Review  Right Eye Distance:   Left Eye Distance:   Bilateral Distance:    Right Eye Near:   Left Eye Near:    Bilateral Near:         MDM   1. Anxiety about health   2. Malingerer    Patient was counseled that his medicine has a side effect of drowsiness. He was encouraged to follow-up with community health and wellness to establish for primary care, and advised if his symptoms worsen to go to the ER     Dorena Bodo, NP 02/06/17 1621

## 2017-02-07 ENCOUNTER — Emergency Department (HOSPITAL_COMMUNITY)
Admission: EM | Admit: 2017-02-07 | Discharge: 2017-02-07 | Disposition: A | Discharge status: Home / Self Care | Payer: Self-pay | Attending: Emergency Medicine | Admitting: Emergency Medicine

## 2017-02-07 ENCOUNTER — Telehealth: Payer: Self-pay | Admitting: Licensed Clinical Social Worker

## 2017-02-07 ENCOUNTER — Encounter (HOSPITAL_COMMUNITY): Payer: Self-pay | Admitting: Emergency Medicine

## 2017-02-07 ENCOUNTER — Other Ambulatory Visit: Payer: Self-pay

## 2017-02-07 DIAGNOSIS — G4710 Hypersomnia, unspecified: Secondary | ICD-10-CM | POA: Insufficient documentation

## 2017-02-07 DIAGNOSIS — Y829 Unspecified medical devices associated with adverse incidents: Secondary | ICD-10-CM | POA: Insufficient documentation

## 2017-02-07 DIAGNOSIS — I10 Essential (primary) hypertension: Secondary | ICD-10-CM | POA: Insufficient documentation

## 2017-02-07 DIAGNOSIS — T450X5A Adverse effect of antiallergic and antiemetic drugs, initial encounter: Secondary | ICD-10-CM | POA: Insufficient documentation

## 2017-02-07 DIAGNOSIS — T50905A Adverse effect of unspecified drugs, medicaments and biological substances, initial encounter: Secondary | ICD-10-CM

## 2017-02-07 NOTE — Telephone Encounter (Signed)
[employer] utilized [employer] (ID# [id number]) to contact pt regarding behavioral health. Pt didn't answer. Business voicemail was provided no message was left due to confidentiality concerns.

## 2017-02-07 NOTE — ED Provider Notes (Signed)
[facility]PT Provider Note   CSN: [id number] Arrival date & time: 02/07/17  1743    History   Chief Complaint Chief Complaint  Patient presents with  . Hypertension    HPI Wesley [name] is a 51 y.o. male.  52 year old male transferred to acute care side of ED for further evaluation. He has been seen by Case Management who report that patient with primary complaint of sleepiness since starting Vistaril. He states that he has an appointment with his primary care doctor tomorrow. He has no c/o nausea or abdominal pain during CM evaluation. Patient previously denied CP and back pain. He has been seen in the ED 54 times in the past 6 months.   History provided by: medical records and care management provider. No language interpreter was used.    Past Medical History:  Diagnosis Date  . Alcohol abuse   . Anxiety   . Chronic neck pain   . Cocaine abuse   . Hypertension     Patient Active Problem List   Diagnosis Date Noted  . 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 Dizziness 01/23/2017  . Back pain 11/27/2016  . Bilateral posterior neck pain 11/27/2016  . Epigastric abdominal pain 11/21/2016  . T wave inversion in EKG 11/21/2016  . Nausea 11/21/2016  . Anxiety 11/21/2016    Past Surgical History:  Procedure Laterality Date  . EYE SURGERY Right ~ 1980   "hit my eye"       Home Medications    Prior to Admission medications   Medication Sig Start Date End Date Taking? Authorizing Provider  diazepam (VALIUM) 5 MG tablet Take 5 mg by mouth daily.    [provider]  escitalopram (LEXAPRO) 10 MG tablet Take 1 tablet (10 mg total) by mouth daily. Patient not taking: Reported on 02/04/2017 02/02/17   Marcine Matar, MD  famotidine (PEPCID) 20 MG tablet Take 1 tablet (20 mg total) by mouth 2 (two) times daily. Patient not taking: Reported on 02/04/2017 01/31/17   Lorre Nick, MD  hydrochlorothiazide (HYDRODIURIL) 12.5 MG tablet One a day Patient taking differently:  Take 12.5 mg by mouth daily.  01/29/17   Elson Areas, PA-C  hydrOXYzine (ATARAX/VISTARIL) 25 MG tablet Take 1 tablet (25 mg total) by mouth 2 (two) times daily as needed for anxiety. 02/02/17   Marcine Matar, MD  meclizine (ANTIVERT) 12.5 MG tablet Take 1 tablet (12.5 mg total) by mouth 3 (three) times daily as needed for dizziness. 01/14/17   Luevenia Maxin, Mina A, PA-C  metoCLOPramide (REGLAN) 10 MG tablet Take 1 tablet (10 mg total) by mouth every 8 (eight) hours as needed for nausea. 01/14/17 02/04/17  Michela Pitcher A, PA-C  omeprazole (PRILOSEC) 20 MG capsule Take 1 capsule (20 mg total) by mouth daily. Patient not taking: Reported on 02/04/2017 02/03/17   Mackuen, Courteney Lyn, MD  sucralfate (CARAFATE) 1 g tablet Take 1 tablet (1 g total) by mouth 4 (four) times daily. Patient not taking: Reported on 02/02/2017 01/31/17   Lorre Nick, MD    Family History Family History  Problem Relation Age of Onset  . Hypertension Mother   . Hypertension Father     Social History Social History  Substance Use Topics  . Smoking status: Never Smoker  . Smokeless tobacco: Never Used  . Alcohol use Yes     Comment: 11/21/2016 "last drank 06/2016"     Allergies   Motrin [ibuprofen] and Zofran [ondansetron hcl]   Review of Systems Review of Systems  Ten systems reviewed and are negative for acute change, except as noted in the HPI.    Physical Exam Updated Vital Signs BP (!) 134/96 (BP Location: Right Arm)   Pulse 84   Temp 98.1 F (36.7 C) (Oral)   Resp 18   Ht 5\' 6"  (1.676 m)   Wt 81.6 kg (180 lb)   SpO2 98%   BMI 29.05 kg/m   Physical Exam Constitutional: He is well-developed, well-nourished, and in no distress. No distress.  HENT:  Head: Normocephalic and atraumatic.  Eyes: Conjunctivae are normal.  Neck: Normal range of motion.  Pulmonary/Chest: Effort normal. No respiratory distress.  Respirations even and unlabored  Musculoskeletal: Normal range of motion.  Neurological: He  is alert. Coordination normal.  GCS 15. Ambulatory with steady gait.  Skin: He is not diaphoretic.  Psychiatric:  Patient calm and cooperative  Nursing note and vitals reviewed.   ED Treatments / Results  Labs (all labs ordered are listed, but only abnormal results are displayed) Labs Reviewed - No data to display  EKG  EKG Interpretation None       Radiology No results found.  Procedures Procedures (including critical care time)  Medications Ordered in ED Medications - No data to display   Initial Impression / Assessment and Plan / ED Course  I have reviewed the triage vital signs and the nursing notes.  Pertinent labs & imaging results that were available during my care of the patient were reviewed by me and considered in my medical decision making (see chart for details).     Patient presents to the emergency department, now complaining of increased drowsiness since starting Vistaril. He has complaints of nausea, but these are chronic based on chart review. Patient has had multiple laboratory workups completed. Labs most recently drawn 2 days ago; no acute findings. This is his 55th visit to the department in the past 6 months.  He has been seen by care management and has been encouraged to follow up with his primary care doctor tomorrow. Patient ambulatory out of the department in stable, satisfactory condition, in NAD.   Final Clinical Impressions(s) / ED Diagnoses   Final diagnoses:  Adverse effect of drug, initial encounter    New Prescriptions New Prescriptions   No medications on file     Antony MaduraHumes, Kelly, Cordelia Poche-C 02/07/17 2156    Derwood KaplanNanavati, Ankit, MD 02/08/17 [id number]

## 2017-02-07 NOTE — ED Provider Notes (Signed)
MSE was initiated and I personally evaluated the patient and placed orders (if any) at  8:51 PM on Feb 07, 2017.   BP (!) 134/96 (BP Location: Right Arm)   Pulse 84   Temp 98.1 F (36.7 C) (Oral)   Resp 18   Ht 5\' 6"  (1.676 m)   Wt 81.6 kg (180 lb)   SpO2 98%   BMI 29.05 kg/m    HPI Wesley [name] is a 51 y.o. male with a PMHx of HTN, who presents to the Emergency Department complaining of elevated blood pressure onset tonight. Pt reports associated HA, dizziness, nausea, vomiting, and abdominal pain. Pt has not tried any medications for the relief of his symptoms. Pt reports that his main concern is his abdominal pain and that is what brought him into the ED. He also notes that tonight he noticed that he had a systolic blood pressure of 150. Pt states that he takes HCTZ and began the medication 5 days ago. He notes that he called EMS tonight for evaluation of his blood pressure and dizziness.  He denies CP, back pain, and any other symptoms.  The patient appears stable so that the remainder of the MSE may be completed by another provider.    Wesley [name], Wesley [name], [place]NP 02/07/17 [id number]    Vanetta MuldersZackowski, Scott, MD 02/08/17 1134

## 2017-02-07 NOTE — ED Notes (Signed)
EDP reports pt complaining of dizziness, nausea, vomiting, and abdominal pain. RN spoke with pt about chief complaint. Pt denies all symptoms except nausea. Pt reports he was started on BP medication 5 days ago by PCP. Pt states this medication is making him sleepy. Pt states, "I'm here because I want them to switch my medication to something else. I have an appointment tomorrow with my doctor." Pt also reports he checked his BP today and systolic was 150. BP checked in room was 143/102.

## 2017-02-07 NOTE — Care Management (Signed)
Patient noted to have had 25 ED visits 1 resulting in an hospital stay. ED CM met with patient at bedside in [place] bed on Pod E. Patient noted to have been a patient at the [facility] in the past but now following up with the [facility] follow up tomorrow 5/31. Patient reports he is here in the ED to have Vistaril switched because it is making him drowsy he was started on medication 5 days ago. CM explained that tomorrow they can address this issue.  CM discussed the where and when to access care reviewed when to come to the ED patient verbalized understanding teach back done. CM will notify [facility] CM concerning patient. Updated K Humes PA-C on Pod E.  No further ED CM need Identified

## 2017-02-07 NOTE — ED Triage Notes (Signed)
Pt to ER for persistent hypertension after starting medication 5 days ago, HCTZ. BP 132/101 at triage.

## 2017-02-08 ENCOUNTER — Ambulatory Visit: Payer: Self-pay | Attending: Internal Medicine | Admitting: Physician Assistant

## 2017-02-08 ENCOUNTER — Encounter (HOSPITAL_COMMUNITY): Payer: Self-pay | Admitting: Emergency Medicine

## 2017-02-08 ENCOUNTER — Ambulatory Visit: Payer: Self-pay | Admitting: Licensed Clinical Social Worker

## 2017-02-08 ENCOUNTER — Other Ambulatory Visit: Payer: Self-pay

## 2017-02-08 ENCOUNTER — Emergency Department (HOSPITAL_COMMUNITY)
Admission: EM | Admit: 2017-02-08 | Discharge: 2017-02-08 | Disposition: A | Discharge status: Home / Self Care | Payer: Self-pay | Attending: Emergency Medicine | Admitting: Emergency Medicine

## 2017-02-08 ENCOUNTER — Telehealth: Payer: Self-pay

## 2017-02-08 ENCOUNTER — Emergency Department (HOSPITAL_COMMUNITY): Payer: Self-pay

## 2017-02-08 VITALS — BP 130/87 | HR 63 | Temp 98.3°F | Resp 16 | Wt 187.0 lb

## 2017-02-08 DIAGNOSIS — I10 Essential (primary) hypertension: Secondary | ICD-10-CM

## 2017-02-08 DIAGNOSIS — F419 Anxiety disorder, unspecified: Secondary | ICD-10-CM

## 2017-02-08 DIAGNOSIS — R079 Chest pain, unspecified: Secondary | ICD-10-CM | POA: Insufficient documentation

## 2017-02-08 DIAGNOSIS — Z79899 Other long term (current) drug therapy: Secondary | ICD-10-CM | POA: Insufficient documentation

## 2017-02-08 LAB — CBC
HCT: 44 % (ref 39.0–52.0)
Hemoglobin: 13.9 g/dL (ref 13.0–17.0)
MCH: 26.8 pg (ref 26.0–34.0)
MCHC: 31.6 g/dL (ref 30.0–36.0)
MCV: 84.9 fL (ref 78.0–100.0)
Platelets: 250 10*3/uL (ref 150–400)
RBC: 5.18 MIL/uL (ref 4.22–5.81)
RDW: 13.5 % (ref 11.5–15.5)
WBC: 5.9 10*3/uL (ref 4.0–10.5)

## 2017-02-08 LAB — BASIC METABOLIC PANEL
Anion gap: 6 (ref 5–15)
BUN: 9 mg/dL (ref 6–20)
CO2: 28 mmol/L (ref 22–32)
Calcium: 9 mg/dL (ref 8.9–10.3)
Chloride: 103 mmol/L (ref 101–111)
Creatinine, Ser: 1.04 mg/dL (ref 0.61–1.24)
GFR calc Af Amer: >60 mL/min (ref >60)
GFR calc non Af Amer: >60 mL/min (ref >60)
Glucose, Bld: 94 mg/dL (ref 65–99)
Potassium: 3.4 mmol/L — ABNORMAL LOW (ref 3.5–5.1)
Sodium: 137 mmol/L (ref 135–145)

## 2017-02-08 LAB — I-STAT TROPONIN, ED: Troponin i, poc: 0 ng/mL (ref 0.00–0.08)

## 2017-02-08 MED ORDER — HYDROCHLOROTHIAZIDE 12.5 MG PO TABS: ORAL_TABLET | ORAL | 3 refills | Status: DC

## 2017-02-08 NOTE — BH Specialist Note (Signed)
Integrated Behavioral Health Initial Visit  MRN: [id number] Name: Wesley [name]   Session Start time: 12:05 PM Session End time: 12:35 PM Total time: 30 minutes  Type of Service: Integrated Behavioral Health- Individual/Family Interpretor:No. Interpretor Name and Language: N/A   Warm Hand Off Completed.       SUBJECTIVE: Wesley [name] is a 51 y.o. male accompanied by patient. Patient was referred by Dr. Laural BenesJohnson and [facility] for anxiety. Patient reports the following symptoms/concerns: overwhelming feelings of worry and sadness, difficulty sleeping, low energy, inability to focus, irritability, and panic attacks Duration of problem: November 2017; Severity of problem: severe  OBJECTIVE: Mood: Anxious and Affect: Appropriate Risk of harm to self or others: No plan to harm self or others   LIFE CONTEXT: Family and Social: Pt has stable housing. Receives emotional support from family who reside nearby School/Work: Pt is unemployed and does not receive public benefits Self-Care: Pt reports reading the bible and talking with family about stressors. He reports being compliant with prescribed medications Life Changes: Pt was injured while employed last year. He also grieves the death of a friend and worries about dying frequently  GOALS ADDRESSED: Patient will reduce symptoms of: anxiety and increase knowledge and/or ability of: coping skills and also: Increase adequate support systems for patient/family   INTERVENTIONS: Solution-Focused Strategies, Mindfulness or Relaxation Training, Supportive Counseling, Psychoeducation and/or Health Education and Link to [facility]  Standardized Assessments completed: PHQ 2&9  ASSESSMENT: Patient currently experiencing increased anxiety triggered by a work injury and the death of a loved one approximately one year ago. Patient reports overwhelming feelings of worry and sadness, difficulty sleeping, low energy, inability to focus,  irritability, and panic attacks. He has been sober from cocaine and alcohol for over a year. Pt receives support from family. Patient may benefit from psychoeducation, psychotherapy, and medication management. LCSWA provided education on anxiety and depression. LCSWA discussed benefits of applying healthy coping skills to decrease symptoms and pt identified coping strategies to utilize on a daily basis (i.e. Relaxation training and medication management) Pt was provided with community resources for crisis intervention, AA support, and therapy.    PLAN: 1. Follow up with behavioral health clinician on : Pt was encouraged to contact LCSWA if symptoms worsen or fail to improve to schedule behavioral appointments at [facility]. 2. Behavioral recommendations: LCSWA recommends that pt apply healthy coping skills discussed and utilize provided resources. Pt is encouraged to schedule follow up appointment with LCSWA 3. Referral(s): [facility] ([facility]/Outside Clinic) and Substance Abuse Program 4. "From scale of 1-10, how likely are you to follow plan?": 7/10  Bridgett LarssonJasmine D Lewis, LCSW 02/08/17 5:27 PM

## 2017-02-08 NOTE — ED Notes (Signed)
ED Provider at bedside. 

## 2017-02-08 NOTE — ED Provider Notes (Signed)
[facility]-EMERGENCY DEPT Provider Note   CSN: [id number] Arrival date & time: 02/08/17  1418  By signing my name below, I, Modena Jansky, attest that this documentation has been prepared under the direction and in the presence of Linwood Dibbles, MD. Electronically Signed: Modena Jansky, Scribe. 02/08/2017. 5:21 PM.  History   Chief Complaint Chief Complaint  Patient presents with  . Chest Pain   The history is provided by the patient. No language interpreter was used.   HPI Comments: Wesley [name] is a 51 y.o. male with a PMHx of anxiety who presents to the Emergency Department complaining of intermittent moderate left-sided chest pain that started today. He reports he had a chiropractic manipulation today. He describes the episode as brief. Denies any hx of similar complaint, fever, cough, diarrhea, or other complaints at this time.  Past Medical History:  Diagnosis Date  . Alcohol abuse   . Anxiety   . Chronic neck pain   . Cocaine abuse   . Hypertension     Patient Active Problem List   Diagnosis Date Noted  . 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 Dizziness 01/23/2017  . Back pain 11/27/2016  . Bilateral posterior neck pain 11/27/2016  . Epigastric abdominal pain 11/21/2016  . T wave inversion in EKG 11/21/2016  . Nausea 11/21/2016  . Anxiety 11/21/2016    Past Surgical History:  Procedure Laterality Date  . EYE SURGERY Right ~ 1980   "hit my eye"       Home Medications    Prior to Admission medications   Medication Sig Start Date End Date Taking? Authorizing Provider  diazepam (VALIUM) 5 MG tablet Take 5 mg by mouth daily.    [provider]  escitalopram (LEXAPRO) 10 MG tablet Take 1 tablet (10 mg total) by mouth daily. Patient not taking: Reported on 02/04/2017 02/02/17   Marcine Matar, MD  famotidine (PEPCID) 20 MG tablet Take 1 tablet (20 mg total) by mouth 2 (two) times daily. Patient not taking: Reported on 02/04/2017 01/31/17   Lorre Nick, MD    hydrochlorothiazide (HYDRODIURIL) 12.5 MG tablet One a day 02/08/17   Anders Simmonds, PA-C  hydrOXYzine (ATARAX/VISTARIL) 25 MG tablet Take 1 tablet (25 mg total) by mouth 2 (two) times daily as needed for anxiety. 02/02/17   Marcine Matar, MD  meclizine (ANTIVERT) 12.5 MG tablet Take 1 tablet (12.5 mg total) by mouth 3 (three) times daily as needed for dizziness. Patient not taking: Reported on 02/08/2017 01/14/17   Michela Pitcher A, PA-C  metoCLOPramide (REGLAN) 10 MG tablet Take 1 tablet (10 mg total) by mouth every 8 (eight) hours as needed for nausea. 01/14/17 02/08/17  Michela Pitcher A, PA-C  omeprazole (PRILOSEC) 20 MG capsule Take 1 capsule (20 mg total) by mouth daily. 02/03/17   Mackuen, Courteney Lyn, MD  sucralfate (CARAFATE) 1 g tablet Take 1 tablet (1 g total) by mouth 4 (four) times daily. Patient not taking: Reported on 02/02/2017 01/31/17   Lorre Nick, MD    Family History Family History  Problem Relation Age of Onset  . Hypertension Mother   . Hypertension Father     Social History Social History  Substance Use Topics  . Smoking status: Never Smoker  . Smokeless tobacco: Never Used  . Alcohol use Yes     Comment: 11/21/2016 "last drank 06/2016"     Allergies   Motrin [ibuprofen] and Zofran [ondansetron hcl]   Review of Systems Review of Systems  Constitutional: Negative for fever.  Respiratory: Negative for cough.   Cardiovascular: Positive for chest pain.  Gastrointestinal: Negative for diarrhea.  All other systems reviewed and are negative.    Physical Exam Updated Vital Signs BP 119/79 (BP Location: Right Arm)   Pulse 80   Temp 98.7 F (37.1 C) (Oral)   Resp 16   SpO2 100%   Physical Exam  Constitutional: He appears well-developed and well-nourished. No distress.  HENT:  Head: Normocephalic and atraumatic.  Right Ear: External ear normal.  Left Ear: External ear normal.  Eyes: Conjunctivae are normal. Right eye exhibits no discharge. Left eye  exhibits no discharge. No scleral icterus.  Neck: Neck supple. No tracheal deviation present.  Cardiovascular: Normal rate, regular rhythm and intact distal pulses.   Pulmonary/Chest: Effort normal and breath sounds normal. No stridor. No respiratory distress. He has no wheezes. He has no rales.  Abdominal: Soft. Bowel sounds are normal. He exhibits no distension. There is no tenderness. There is no rebound and no guarding.  Musculoskeletal: He exhibits no edema or tenderness.  Neurological: He is alert. He has normal strength. No cranial nerve deficit (no facial droop, extraocular movements intact, no slurred speech) or sensory deficit. He exhibits normal muscle tone. He displays no seizure activity. Coordination normal.  Skin: Skin is warm and dry. No rash noted.  Psychiatric: He has a normal mood and affect.  Nursing note and vitals reviewed.    ED Treatments / Results  DIAGNOSTIC STUDIES: Oxygen Saturation is 100% on RA, normal by my interpretation.    COORDINATION OF CARE: 5:25 PM- Pt advised of plan for treatment and pt agrees.  Labs (all labs ordered are listed, but only abnormal results are displayed) Labs Reviewed  BASIC METABOLIC PANEL - Abnormal; Notable for the following:       Result Value   Potassium 3.4 (*)    All other components within normal limits  CBC  I-STAT TROPOININ, ED    EKG  EKG Interpretation  Date/Time:  Thursday Feb 08 2017 14:59:05 EDT Ventricular Rate:  99 PR Interval:  136 QRS Duration: 102 QT Interval:  342 QTC Calculation: 438 R Axis:   -52 Text Interpretation:  Normal sinus rhythm Left anterior fascicular block Moderate voltage criteria for LVH, may be normal variant Abnormal ECG No significant change since last tracing Confirmed by Linwood DibblesKnapp, Derrich Gaby [phone]([id number]) on 02/08/2017 4:29:52 PM       Radiology Dg Chest 2 View  Result Date: 02/08/2017 CLINICAL DATA:  Left-sided chest pain for 2 hours EXAM: CHEST  2 VIEW COMPARISON:  01/06/2017 FINDINGS:  Chronic interstitial coarsening at the bases. Chronic cardiomegaly and mild aortic tortuosity. There is no edema, consolidation, effusion, or pneumothorax. No acute osseous finding. IMPRESSION: Stable from priors.  No evidence of acute disease. Electronically Signed   By: Marnee SpringJonathon  Watts M.D.   On: 02/08/2017 15:35    Procedures Procedures (including critical care time)  Medications Ordered in ED Medications - No data to display   Initial Impression / Assessment and Plan / ED Course  I have reviewed the triage vital signs and the nursing notes.  Pertinent labs & imaging results that were available during my care of the patient were reviewed by me and considered in my medical decision making (see chart for details).   Pt has a history of recurrent episodes of chest pain and multiple visits to the emergency room. Patient had a transient episode of chest pain today. He has had prior cardiac stress testing is reassuring.  At  this time there does not appear to be any evidence of an acute emergency medical condition and the patient appears stable for discharge with appropriate outpatient follow up.   Final Clinical Impressions(s) / ED Diagnoses   Final diagnoses:  Chest pain, unspecified type    New Prescriptions New Prescriptions   No medications on file   I personally performed the services described in this documentation, which was scribed in my presence.  The recorded information has been reviewed and is accurate.    Linwood Dibbles, MD 02/08/17 [phone]

## 2017-02-08 NOTE — Telephone Encounter (Signed)
Message received from CaledoniaWandalyn Rogers, RN CM noting that the patient has concerns that the vistaril is making him too drowsy.  This information was shared with Georgian CoAngela McClung, PA who will be seeing the patient today. Jenel LucksJasmine Lewis, LCSW/[facility] also notified that the patient is scheduled to be in the clinic today.  Update provided to Beecher McardleW. Rogers, RN CM

## 2017-02-08 NOTE — ED Notes (Signed)
Pt not in room when RN entered to D/C patient. Paperwork held at desk in the event that the pt returns.

## 2017-02-08 NOTE — ED Triage Notes (Signed)
C/o chest pain-- left sided started 1.5  Hours ago-- "hurts inside"  Pt has had heart catheterization in past.

## 2017-02-08 NOTE — Progress Notes (Signed)
Patient here for lab visit only 

## 2017-02-08 NOTE — Progress Notes (Signed)
Patient ID: Wesley [name], male   DOB: 1966/07/20, 51 y.o.   MRN: [id number]     Wesley [name], is a 51 y.o. male  NWG:[id number]  [id number]:[id number]  DOB - 1966-06-07  Subjective:  Chief Complaint and HPI: Wesley [name] is a 51 y.o. male here today for continued problems with anxiety and hypertension.  He has been seen in the ED multiple times in the last 2 months(>20 times) and at our office to establish care on 02/02/2017.  His visits have been related to anxiety, nausea, BP, dizziness related.  Remission of substance abuse from alcohol and cocaine for about 1 year. He had an injury at work about 1 year ago-this is when his symptoms progressed.  He worries a lot.  He frequently feels like he is going to die.  He is worried that his BP meds(HCTZ 12.5mg ) is too strong.  Vistaril helps some with anxiety but is making him sleepy.  He only intermittently takes his BP meds.  He will check his BP and it will be high bc he hasn't been taking his meds, then he panics and takes an HCTZ and feels dizzy and panics.  He then feels he is dying which has led to multiple ED visits.    He denies SI/HI.  He has never had counseling or been in 12 step recovery for substance abuse or from the injury he sustained.    Reviewed several hospital notes, recent OV, and 6 months of labs.    FH:  All siblings and paretns with htn  ROS:   Constitutional:  No f/c, No night sweats, No unexplained weight loss. EENT:  No vision changes, No blurry vision, No hearing changes. No mouth, throat, or ear problems.  Respiratory: No cough, No SOB Cardiac: No CP, no palpitations GI:  No abd pain, No V/D. +nausea and "stomach pain" GU: No Urinary s/sx Musculoskeletal: No joint pain Neuro: No headache, no dizziness, no motor weakness.  Skin: No rash Endocrine:  No polydipsia. No polyuria.  Psych: Denies SI/HI  No problems updated.  ALLERGIES: Allergies  Allergen Reactions  . Motrin [Ibuprofen] Other (See Comments)    Makes  his throat get "very dry" after taking it  . Zofran [Ondansetron Hcl] Other (See Comments)    Sweating and body feels bad    PAST MEDICAL HISTORY: Past Medical History:  Diagnosis Date  . Alcohol abuse   . Anxiety   . Chronic neck pain   . Cocaine abuse   . Hypertension     MEDICATIONS AT HOME: Prior to Admission medications   Medication Sig Start Date End Date Taking? Authorizing Provider  hydrOXYzine (ATARAX/VISTARIL) 25 MG tablet Take 1 tablet (25 mg total) by mouth 2 (two) times daily as needed for anxiety. 02/02/17  Yes Marcine Matar, MD  metoCLOPramide (REGLAN) 10 MG tablet Take 1 tablet (10 mg total) by mouth every 8 (eight) hours as needed for nausea. 01/14/17 02/08/17 Yes Fawze, Mina A, PA-C  omeprazole (PRILOSEC) 20 MG capsule Take 1 capsule (20 mg total) by mouth daily. 02/03/17  Yes Mackuen, Courteney Lyn, MD  diazepam (VALIUM) 5 MG tablet Take 5 mg by mouth daily.    [provider]  escitalopram (LEXAPRO) 10 MG tablet Take 1 tablet (10 mg total) by mouth daily. Patient not taking: Reported on 02/04/2017 02/02/17   Marcine Matar, MD  famotidine (PEPCID) 20 MG tablet Take 1 tablet (20 mg total) by mouth 2 (two) times daily. Patient not taking: Reported on  02/04/2017 01/31/17   Lorre NickAllen, Anthony, MD  hydrochlorothiazide (HYDRODIURIL) 12.5 MG tablet One a day 02/08/17   Georgian CoMcClung, Josmar Messimer M, PA-C  meclizine (ANTIVERT) 12.5 MG tablet Take 1 tablet (12.5 mg total) by mouth 3 (three) times daily as needed for dizziness. Patient not taking: Reported on 02/08/2017 01/14/17   Michela PitcherFawze, Mina A, PA-C  sucralfate (CARAFATE) 1 g tablet Take 1 tablet (1 g total) by mouth 4 (four) times daily. Patient not taking: Reported on 02/02/2017 01/31/17   Lorre NickAllen, Anthony, MD     Objective:  EXAM:   Vitals:   02/08/17 1104  BP: 130/87  Pulse: 63  Resp: 16  Temp: 98.3 F (36.8 C)  TempSrc: Oral  SpO2: 97%  Weight: 187 lb (84.8 kg)    General appearance : A&OX3. NAD.  Non-toxic-appearing HEENT: Atraumatic and Normocephalic.  PERRLA. EOM intact. Neck: supple, no JVD. No cervical lymphadenopathy. No thyromegaly Chest/Lungs:  Breathing-non-labored, Good air entry bilaterally, breath sounds normal without rales, rhonchi, or wheezing  CVS: S1 S2 regular, no murmurs, gallops, rubs  Abdomen: Bowel sounds present, Non tender and not distended with no gaurding, rigidity or rebound. Extremities: Bilateral Lower Ext shows no edema, both legs are warm to touch with = pulse throughout Neurology:  CN II-XII grossly intact, Non focal.   Psych:  TP linear. J/I WNL. Normal speech. Appropriate eye contact and affect.  Skin:  No Rash  Data Review Lab Results  Component Value Date   HGBA1C 5.8 (H) 11/21/2016     Assessment & Plan   1. Hypertension, unspecified type Uncontrolled due to non-compliance with medication regimen.  I had a lengthy discussion with him about taking his medications consistently.  He agrees but is still afraid the HCTZ 12.5mg  is too strong for him.  I repeatedly explained that this is a minimal dose.   - hydrochlorothiazide (HYDRODIURIL) 12.5 MG tablet; One a day  Dispense: 30 tablet; Refill: 3  2. Anxiety It is likely this is related to being sober X 1 year with no recovery program.  He likely used to drink to medicate his anxiety and now that alcohol is gone, his anxiety is escalating.  I gave him AA schedules and believe this would greatly help his anxiety.  WE also discussed healthy diet, deep breathing, adequate rest for anxiety.  In addition, we discussed counseling which he is deferring for now, but he did agree to meet with Jenel LucksJasmine Lewis, our social worker today.  We also discussed possibility or SSRI which he does not want to do at this time bc he doesn't want to be on any medications.   - Vitamin D, 25-hydroxy  Spent >30 mins face to face counseling on all as described above.   Patient have been counseled extensively about nutrition and  exercise  Return in about 3 weeks (around 03/01/2017) for Dr Laural BenesJohnson: htn and anxiety.  The patient was given clear instructions to go to ER or return to medical center if symptoms don't improve, worsen or new problems develop. The patient verbalized understanding. The patient was told to call to get lab results if they haven't heard anything in the next week.     Georgian CoAngela Jamilex Bohnsack, PA-C [facility] [place], [place] [phone]   02/08/2017, 1:01 PM

## 2017-02-09 ENCOUNTER — Emergency Department (HOSPITAL_COMMUNITY)
Admission: EM | Admit: 2017-02-09 | Discharge: 2017-02-09 | Disposition: A | Discharge status: Home / Self Care | Payer: Self-pay | Attending: Emergency Medicine | Admitting: Emergency Medicine

## 2017-02-09 ENCOUNTER — Ambulatory Visit: Payer: Self-pay | Attending: Internal Medicine | Admitting: Internal Medicine

## 2017-02-09 ENCOUNTER — Encounter (HOSPITAL_COMMUNITY): Payer: Self-pay | Admitting: Nurse Practitioner

## 2017-02-09 ENCOUNTER — Other Ambulatory Visit: Payer: Self-pay | Admitting: Physician Assistant

## 2017-02-09 ENCOUNTER — Encounter: Payer: Self-pay | Admitting: Internal Medicine

## 2017-02-09 VITALS — BP 132/92 | HR 79 | Temp 98.5°F | Resp 16 | Wt 183.6 lb

## 2017-02-09 DIAGNOSIS — Z79899 Other long term (current) drug therapy: Secondary | ICD-10-CM | POA: Insufficient documentation

## 2017-02-09 DIAGNOSIS — F419 Anxiety disorder, unspecified: Secondary | ICD-10-CM | POA: Insufficient documentation

## 2017-02-09 DIAGNOSIS — E559 Vitamin D deficiency, unspecified: Secondary | ICD-10-CM

## 2017-02-09 DIAGNOSIS — F418 Other specified anxiety disorders: Secondary | ICD-10-CM

## 2017-02-09 LAB — VITAMIN D 25 HYDROXY (VIT D DEFICIENCY, FRACTURES): Vit D, 25-Hydroxy: 19.7 ng/mL — ABNORMAL LOW (ref 30.0–100.0)

## 2017-02-09 MED ORDER — ESCITALOPRAM OXALATE 10 MG PO TABS: 10.0000 mg | ORAL_TABLET | Freq: Every day | ORAL | 3 refills | Status: DC

## 2017-02-09 MED ORDER — VITAMIN D (ERGOCALCIFEROL) 1.25 MG (50000 UNIT) PO CAPS: 50000.0000 [IU] | ORAL_CAPSULE | ORAL | 0 refills | Status: DC

## 2017-02-09 MED FILL — ?ESCITALOPRAM 10 MG TABLET: 10 | 30 days supply | Qty: 30 | Fill #0

## 2017-02-09 NOTE — ED Triage Notes (Signed)
Pt presents with c/o medication reaction. He reports episodes of feeling hot, sweaty and flushed about two hours after taking his hydrochlorothiazide. He denies syncope,  weakness, dizziness, chest pain, shortness of breath. He started the hydrochlorothiazide about six days ago and has had the episodes nearly every day. The episodes last from minutes to hours and then resolve. He overall feels well but is very concerned about these episodes being an adverse reaction to the hydrochlorothiazide

## 2017-02-09 NOTE — Progress Notes (Signed)
Patient ID: Wesley [name], male    DOB: 1966/05/12  MRN: [id number]  CC: Anxiety   Subjective: Wesley [name] is a 51 y.o. male who presents for walk in visit. His concerns today include:  Since I saw him 1 wk ago. Pt has been to the ER just about every day with anxiety issues.  Saw our PA and LCSW yesterday but still went to ER yesterday and today c/o CP and anxious about his blood pressure.   He never filled the prescription for the Lexapro that was prescribed one week ago due to cost. -Taking the hydroxyzine. States that sometimes it makes him sleepy. -He is very anxious about his blood pressure and states that he checks it every day at [facility].   Patient Active Problem List   Diagnosis Date Noted  . 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 Dizziness 01/23/2017  . Back pain 11/27/2016  . Bilateral posterior neck pain 11/27/2016  . Epigastric abdominal pain 11/21/2016  . T wave inversion in EKG 11/21/2016  . Nausea 11/21/2016  . Anxiety 11/21/2016     Current Outpatient Prescriptions on File Prior to Visit  Medication Sig Dispense Refill  . diazepam (VALIUM) 5 MG tablet Take 5 mg by mouth daily.    . famotidine (PEPCID) 20 MG tablet Take 1 tablet (20 mg total) by mouth 2 (two) times daily. (Patient not taking: Reported on 02/04/2017) 30 tablet 0  . hydrochlorothiazide (HYDRODIURIL) 12.5 MG tablet One a day 30 tablet 3  . hydrOXYzine (ATARAX/VISTARIL) 25 MG tablet Take 1 tablet (25 mg total) by mouth 2 (two) times daily as needed for anxiety. 30 tablet 1  . meclizine (ANTIVERT) 12.5 MG tablet Take 1 tablet (12.5 mg total) by mouth 3 (three) times daily as needed for dizziness. (Patient not taking: Reported on 02/08/2017) 10 tablet 0  . metoCLOPramide (REGLAN) 10 MG tablet Take 1 tablet (10 mg total) by mouth every 8 (eight) hours as needed for nausea. 15 tablet 0  . omeprazole (PRILOSEC) 20 MG capsule Take 1 capsule (20 mg total) by mouth daily. 30 capsule 0  . sucralfate (CARAFATE) 1 g tablet Take 1  tablet (1 g total) by mouth 4 (four) times daily. (Patient not taking: Reported on 02/02/2017) 30 tablet 0  . Vitamin D, Ergocalciferol, (DRISDOL) 50000 units CAPS capsule Take 1 capsule (50,000 Units total) by mouth every 7 (seven) days. 16 capsule 0   No current facility-administered medications on file prior to visit.     Allergies  Allergen Reactions  . Motrin [Ibuprofen] Other (See Comments)    Makes his throat get "very dry" after taking it  . Zofran [Ondansetron Hcl] Other (See Comments)    Sweating and body feels bad    Social History   Social History  . Marital status: Significant Other    Spouse name: N/A  . Number of children: N/A  . Years of education: N/A   Occupational History  . Not on file.   Social History Main Topics  . Smoking status: Never Smoker  . Smokeless tobacco: Never Used  . Alcohol use No     Comment: 11/21/2016 "last drank 06/2016"; denies   . Drug use: Yes     Comment: denies, reports past history   . Sexual activity: Not on file   Other Topics Concern  . Not on file   Social History Narrative  . No narrative on file    Family History  Problem Relation Age of Onset  . Hypertension Mother   .  Hypertension Father     Past Surgical History:  Procedure Laterality Date  . EYE SURGERY Right ~ 1980   "hit my eye"    ROS: Review of Systems -As stated above.  PHYSICAL EXAM: BP (!) 132/92   Pulse 79   Temp 98.5 F (36.9 C) (Oral)   Resp 16   Wt 183 lb 9.6 oz (83.3 kg)   SpO2 99%   BMI 29.63 kg/m   Physical Exam  General appearance - alert, well appearing, and in no distress Mental status - oriented, anxious Chest - clear to auscultation, no wheezes, rales or rhonchi, symmetric air entry Heart - normal rate, regular rhythm, normal S1, S2, no murmurs, rubs, clicks or gallops Extremities - peripheral pulses normal, no pedal edema, no clubbing or cyanosis   ASSESSMENT AND PLAN: 1. Anxiety disorder, unspecified type -He [facility]  [facility] where [facility] will be [facility] for him. Patient instructed to take the medicine daily and that it may take 4 weeks before he sees for the effects. -Advised to continue hydroxyzine but to cut the pill in half if the 25 mg makes him too sleepy. He has a follow-up appointment with me later this month. -Advised to stop checking the blood pressure as this causes a lot of anxiety for him -I will check with LCSW  to see [facility] or some other community base mental health practice - escitalopram (LEXAPRO) 10 MG tablet; Take 1 tablet (10 mg total) by mouth daily.  Dispense: 30 tablet; Refill: 3   Patient was given the opportunity to ask questions.  Patient verbalized understanding of the plan and was able to repeat key elements of the plan.   No orders of the defined types were placed in this encounter.    Requested Prescriptions   Signed Prescriptions Disp Refills  . escitalopram (LEXAPRO) 10 MG tablet 30 tablet 3    Sig: Take 1 tablet (10 mg total) by mouth daily.    Jonah Blueeborah Johnson, MD, FACP

## 2017-02-09 NOTE — ED Provider Notes (Signed)
[facility]-EMERGENCY DEPT Provider Note   CSN: [id number] Arrival date & time: 02/09/17  1157  By signing my name below, I, Linna Darner, attest that this documentation has been prepared under the direction and in the presence of Lyndel Safe, [place]. Electronically Signed: Linna Darner, Scribe. 02/09/2017. 12:52 PM.  History   Chief Complaint Chief Complaint  Patient presents with  . Medication Reaction   The history is provided by the patient. No language interpreter was used.    HPI Comments: Wesley [name] is a 51 y.o. male with 82 ED/UC visits in the last 6 months who presents to the Emergency Department complaining of persistent anxiety for one year, worse over the last few days. He is currently asymptomatic with no SOB, rash, chest pain, lightheadedness or feeling hot. Patient sustained an injury at work one year ago and developed anxiety over his health and well-being at that time. Per the note from a visit with his PCP yesterday, he "worries a lot" and "frequently feels like he is going to die". Patient is concerned that his HCTZ 12.5 mg is too strong of a dose and reports that Vistaril makes him feel sleepy. He was prescribed Valium yesterday at his PCP's office which he has not yet taken. Today, he states that he became dizzy, "hot", diaphoretic and flushed after taking his HCTZ. He has taken his HCTZ before with out this reaction.  His symptoms went away with in 5 minutes with out treatment. He has experienced these symptoms intermittently for one year. Of note, he has been drug and alcohol free for one year and his PCP believes his disproportionate anxiety may be due to the fact that he no longer drinks alcohol. He denies syncope, vomiting, fevers, chills, or any other associated symptoms.  Past Medical History:  Diagnosis Date  . Alcohol abuse   . Anxiety   . Chronic neck pain   . Cocaine abuse   . Hypertension     Patient Active Problem List   Diagnosis Date Noted  .  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 Dizziness 01/23/2017  . Back pain 11/27/2016  . Bilateral posterior neck pain 11/27/2016  . Epigastric abdominal pain 11/21/2016  . T wave inversion in EKG 11/21/2016  . Nausea 11/21/2016  . Anxiety 11/21/2016    Past Surgical History:  Procedure Laterality Date  . EYE SURGERY Right ~ 1980   "hit my eye"       Home Medications    Prior to Admission medications   Medication Sig Start Date End Date Taking? Authorizing Provider  diazepam (VALIUM) 5 MG tablet Take 5 mg by mouth daily.    [provider]  escitalopram (LEXAPRO) 10 MG tablet Take 1 tablet (10 mg total) by mouth daily. Patient not taking: Reported on 02/04/2017 02/02/17   Marcine Matar, MD  famotidine (PEPCID) 20 MG tablet Take 1 tablet (20 mg total) by mouth 2 (two) times daily. Patient not taking: Reported on 02/04/2017 01/31/17   Lorre Nick, MD  hydrochlorothiazide (HYDRODIURIL) 12.5 MG tablet One a day 02/08/17   Anders Simmonds, PA-C  hydrOXYzine (ATARAX/VISTARIL) 25 MG tablet Take 1 tablet (25 mg total) by mouth 2 (two) times daily as needed for anxiety. 02/02/17   Marcine Matar, MD  meclizine (ANTIVERT) 12.5 MG tablet Take 1 tablet (12.5 mg total) by mouth 3 (three) times daily as needed for dizziness. Patient not taking: Reported on 02/08/2017 01/14/17   Michela Pitcher A, PA-C  metoCLOPramide (REGLAN) 10 MG tablet Take 1  tablet (10 mg total) by mouth every 8 (eight) hours as needed for nausea. 01/14/17 02/08/17  Michela Pitcher A, PA-C  omeprazole (PRILOSEC) 20 MG capsule Take 1 capsule (20 mg total) by mouth daily. 02/03/17   Mackuen, Courteney Lyn, MD  sucralfate (CARAFATE) 1 g tablet Take 1 tablet (1 g total) by mouth 4 (four) times daily. Patient not taking: Reported on 02/02/2017 01/31/17   Lorre Nick, MD  Vitamin D, Ergocalciferol, (DRISDOL) 50000 units CAPS capsule Take 1 capsule (50,000 Units total) by mouth every 7 (seven) days. 02/09/17   Anders Simmonds, PA-C     Family History Family History  Problem Relation Age of Onset  . Hypertension Mother   . Hypertension Father     Social History Social History  Substance Use Topics  . Smoking status: Never Smoker  . Smokeless tobacco: Never Used  . Alcohol use No     Comment: 11/21/2016 "last drank 06/2016"; denies      Allergies   Motrin [ibuprofen] and Zofran [ondansetron hcl]   Review of Systems Review of Systems  Constitutional: Positive for diaphoresis (Earlier today, not right now). Negative for chills and fever.  Respiratory: Negative for cough, choking, chest tightness and shortness of breath.   Gastrointestinal: Negative for nausea and vomiting.  Skin: Negative for rash.  Neurological: Positive for dizziness (Earlier today, not right now). Negative for syncope, light-headedness and headaches.  Psychiatric/Behavioral: The patient is nervous/anxious.    Physical Exam Updated Vital Signs BP 129/83 (BP Location: Left Arm)   Pulse 83   Temp 98.4 F (36.9 C) (Oral)   Resp 16   SpO2 99%   Physical Exam  Constitutional: He appears well-developed and well-nourished. No distress.  Cardiovascular: Normal rate and regular rhythm.   Pulmonary/Chest: Effort normal and breath sounds normal. No respiratory distress.  Neurological: He is alert. No sensory deficit. He exhibits normal muscle tone.  Skin: Skin is warm and dry. No rash noted.  Psychiatric: His behavior is normal. His mood appears anxious.  Nursing note and vitals reviewed.  ED Treatments / Results  Labs (all labs ordered are listed, but only abnormal results are displayed) Labs Reviewed - No data to display  EKG  EKG Interpretation None       Radiology Dg Chest 2 View  Result Date: 02/08/2017 CLINICAL DATA:  Left-sided chest pain for 2 hours EXAM: CHEST  2 VIEW COMPARISON:  01/06/2017 FINDINGS: Chronic interstitial coarsening at the bases. Chronic cardiomegaly and mild aortic tortuosity. There is no edema,  consolidation, effusion, or pneumothorax. No acute osseous finding. IMPRESSION: Stable from priors.  No evidence of acute disease. Electronically Signed   By: Marnee Spring M.D.   On: 02/08/2017 15:35    Procedures Procedures (including critical care time)  DIAGNOSTIC STUDIES: Oxygen Saturation is 100% on RA, normal by my interpretation.    COORDINATION OF CARE: 12:52 PM Discussed treatment plan with pt at bedside and pt agreed to plan.  Medications Ordered in ED Medications - No data to display   Initial Impression / Assessment and Plan / ED Course  I have reviewed the triage vital signs and the nursing notes.  Pertinent labs & imaging results that were available during my care of the patient were reviewed by me and considered in my medical decision making (see chart for details).     Logyn Lortz presents with concern about anxiety related to blood pressure medications consistent with a panic attack and anxiety related to health concerns. He has  been seen at his PCP for this and has been seen 56 times in the past six months with multiple anxiety related visits.  His symptoms appear consistent with a panic attack, he is currently asymptomatic and hemodynamically stable.  No evidence of allergic or adverse reaction related to his low dose HCTZ.   Patient was given re-assurance with instructions to follow up on these issues with his PCP.     At this time there does not appear to be any evidence of an acute emergency medical condition and the patient appears stable for discharge with appropriate outpatient follow up.Diagnosis was discussed with patient who verbalizes understanding and is agreeable to discharge.   Final Clinical Impressions(s) / ED Diagnoses   Final diagnoses:  Anxiety about health    New Prescriptions Discharge Medication List as of 02/09/2017 12:52 PM     I personally performed the services described in this documentation, which was scribed in my presence. The  recorded information has been reviewed and is accurate.    Cristina GongHammond, Elizabeth W, PA-C 02/09/17 1503    Nira Connardama, Pedro Eduardo, MD 02/13/17 [phone]

## 2017-02-09 NOTE — Patient Instructions (Signed)
Start taking Lexapro.  If the Hydroxyzine makes you sleepy, cut the pill in half and take half a pill.  Stop checking your blood pressure.

## 2017-02-09 NOTE — ED Notes (Signed)
Pt had a flushing sensation after taking his meds this am and was worried . It went away

## 2017-02-09 NOTE — Discharge Instructions (Signed)
Today your blood pressure is good and the medication is a low, safe dose.  Please follow up with your PCP.  Please return to the ED if you have new or concerning symptoms.  Please take your blood pressure medication daily.

## 2017-02-11 ENCOUNTER — Emergency Department (HOSPITAL_COMMUNITY)
Admission: EM | Admit: 2017-02-11 | Discharge: 2017-02-11 | Disposition: A | Discharge status: Home / Self Care | Payer: Self-pay | Attending: Emergency Medicine | Admitting: Emergency Medicine

## 2017-02-11 ENCOUNTER — Encounter (HOSPITAL_COMMUNITY): Payer: Self-pay | Admitting: Emergency Medicine

## 2017-02-11 DIAGNOSIS — Z79899 Other long term (current) drug therapy: Secondary | ICD-10-CM | POA: Insufficient documentation

## 2017-02-11 DIAGNOSIS — F5101 Primary insomnia: Secondary | ICD-10-CM | POA: Insufficient documentation

## 2017-02-11 DIAGNOSIS — F419 Anxiety disorder, unspecified: Secondary | ICD-10-CM | POA: Insufficient documentation

## 2017-02-11 DIAGNOSIS — I10 Essential (primary) hypertension: Secondary | ICD-10-CM | POA: Insufficient documentation

## 2017-02-11 LAB — BASIC METABOLIC PANEL
Anion gap: 8 (ref 5–15)
BUN: 12 mg/dL (ref 6–20)
CO2: 25 mmol/L (ref 22–32)
Calcium: 9.1 mg/dL (ref 8.9–10.3)
Chloride: 100 mmol/L — ABNORMAL LOW (ref 101–111)
Creatinine, Ser: 0.74 mg/dL (ref 0.61–1.24)
GFR calc Af Amer: >60 mL/min (ref >60)
GFR calc non Af Amer: >60 mL/min (ref >60)
Glucose, Bld: 106 mg/dL — ABNORMAL HIGH (ref 65–99)
Potassium: 3.8 mmol/L (ref 3.5–5.1)
Sodium: 133 mmol/L — ABNORMAL LOW (ref 135–145)

## 2017-02-11 LAB — CBC
HCT: 42.7 % (ref 39.0–52.0)
Hemoglobin: 13.9 g/dL (ref 13.0–17.0)
MCH: 27.3 pg (ref 26.0–34.0)
MCHC: 32.6 g/dL (ref 30.0–36.0)
MCV: 83.7 fL (ref 78.0–100.0)
Platelets: 263 10*3/uL (ref 150–400)
RBC: 5.1 MIL/uL (ref 4.22–5.81)
RDW: 13 % (ref 11.5–15.5)
WBC: 6.7 10*3/uL (ref 4.0–10.5)

## 2017-02-11 MED ORDER — ACETAMINOPHEN 500 MG PO TABS
1000.0000 mg | ORAL_TABLET | Freq: Once | ORAL | Status: AC
  Administered 2017-02-11: 1000 mg via ORAL
  Filled 2017-02-11: qty 2

## 2017-02-11 MED ORDER — LORAZEPAM 1 MG PO TABS
1.0000 mg | ORAL_TABLET | Freq: Once | ORAL | Status: AC
Start: 2017-02-11 — End: 2017-02-11
  Administered 2017-02-11: 1 mg via ORAL
  Filled 2017-02-11: qty 1

## 2017-02-11 NOTE — ED Provider Notes (Signed)
[facility] Provider Note   CSN: [id number] Arrival date & time: 02/11/17  0407     History   Chief Complaint Chief Complaint  Patient presents with  . Hypertension  . Headache    HPI Wesley [name] is a 51 y.o. male.  Patient with hx anxiety, c/o feeling dizzy at times, at other times anxious, worried about blood pressure, and also c/o intermittent problems sleeping at night. No acute or abrupt worsening of symptoms this AM.  Symptoms intermittent, but overall persistent. Denies specific exacerbating or alleviating factors. Is on hctz for htn, vistaril for anxiety, and lexapro for depression, but doesn't feel the medicine helps.   Patient denies feeling depressed. No thoughts of self harm. Has normal appetite. No wt loss. Intermittent mild headache, gradual in onset. No severe head pain. No change in speech or vision. No numbness/weakness. Denies chest pain or sob. Denies etoh or drug use.    The history is provided by the patient.  Hypertension  Associated symptoms include headaches. Pertinent negatives include no chest pain, no abdominal pain and no shortness of breath.  Headache   Pertinent negatives include no fever, no shortness of breath and no vomiting.    Past Medical History:  Diagnosis Date  . Alcohol abuse   . Anxiety   . Chronic neck pain   . Cocaine abuse   . Hypertension     Patient Active Problem List   Diagnosis Date Noted  . 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 Dizziness 01/23/2017  . Back pain 11/27/2016  . Bilateral posterior neck pain 11/27/2016  . Epigastric abdominal pain 11/21/2016  . T wave inversion in EKG 11/21/2016  . Nausea 11/21/2016  . Anxiety 11/21/2016    Past Surgical History:  Procedure Laterality Date  . EYE SURGERY Right ~ 1980   "hit my eye"       Home Medications    Prior to Admission medications   Medication Sig Start Date End Date Taking? Authorizing Provider  diazepam (VALIUM) 5 MG tablet Take 5 mg by mouth daily.     [provider]  escitalopram (LEXAPRO) 10 MG tablet Take 1 tablet (10 mg total) by mouth daily. 02/09/17   Marcine Matar, MD  famotidine (PEPCID) 20 MG tablet Take 1 tablet (20 mg total) by mouth 2 (two) times daily. Patient not taking: Reported on 02/04/2017 01/31/17   Lorre Nick, MD  hydrochlorothiazide (HYDRODIURIL) 12.5 MG tablet One a day 02/08/17   Anders Simmonds, PA-C  hydrOXYzine (ATARAX/VISTARIL) 25 MG tablet Take 1 tablet (25 mg total) by mouth 2 (two) times daily as needed for anxiety. 02/02/17   Marcine Matar, MD  meclizine (ANTIVERT) 12.5 MG tablet Take 1 tablet (12.5 mg total) by mouth 3 (three) times daily as needed for dizziness. Patient not taking: Reported on 02/08/2017 01/14/17   Michela Pitcher A, PA-C  metoCLOPramide (REGLAN) 10 MG tablet Take 1 tablet (10 mg total) by mouth every 8 (eight) hours as needed for nausea. 01/14/17 02/08/17  Michela Pitcher A, PA-C  omeprazole (PRILOSEC) 20 MG capsule Take 1 capsule (20 mg total) by mouth daily. 02/03/17   Mackuen, Courteney Lyn, MD  sucralfate (CARAFATE) 1 g tablet Take 1 tablet (1 g total) by mouth 4 (four) times daily. Patient not taking: Reported on 02/02/2017 01/31/17   Lorre Nick, MD  Vitamin D, Ergocalciferol, (DRISDOL) 50000 units CAPS capsule Take 1 capsule (50,000 Units total) by mouth every 7 (seven) days. 02/09/17   Anders Simmonds, PA-C  Family History Family History  Problem Relation Age of Onset  . Hypertension Mother   . Hypertension Father     Social History Social History  Substance Use Topics  . Smoking status: Never Smoker  . Smokeless tobacco: Never Used  . Alcohol use No     Comment: 11/21/2016 "last drank 06/2016"; denies      Allergies   Motrin [ibuprofen] and Zofran [ondansetron hcl]   Review of Systems Review of Systems  Constitutional: Negative for fever.  HENT: Negative for sore throat.   Eyes: Negative for visual disturbance.  Respiratory: Negative for shortness of breath.    Cardiovascular: Negative for chest pain and leg swelling.  Gastrointestinal: Negative for abdominal pain and vomiting.  Genitourinary: Negative for flank pain.  Musculoskeletal: Negative for neck pain.  Skin: Negative for rash.  Neurological: Positive for headaches.  Hematological: Does not bruise/bleed easily.  Psychiatric/Behavioral: The patient is nervous/anxious.      Physical Exam Updated Vital Signs BP (!) 148/106 (BP Location: Left Arm)   Pulse 93   Temp 97.7 F (36.5 C) (Oral)   Resp 18   Ht 1.676 m (5\' 6" )   Wt 83 kg (183 lb)   SpO2 97%   BMI 29.54 kg/m   Physical Exam  Constitutional: He appears well-developed and well-nourished. No distress.  HENT:  Head: Atraumatic.  Mouth/Throat: Oropharynx is clear and moist.  Eyes: Conjunctivae are normal. Pupils are equal, round, and reactive to light.  Neck: Neck supple. No tracheal deviation present. No thyromegaly present.  No stiffness or rigidity  Cardiovascular: Normal rate, regular rhythm, normal heart sounds and intact distal pulses.   Pulmonary/Chest: Effort normal and breath sounds normal. No accessory muscle usage. No respiratory distress.  Abdominal: Soft. Bowel sounds are normal. He exhibits no distension. There is no tenderness.  Musculoskeletal: He exhibits no edema.  Neurological: He is alert.  Speech clear/fluent. Motor intact bil. Steady gait.   Skin: Skin is warm and dry. No rash noted. He is not diaphoretic.  Psychiatric:  Anxious appearing.   Nursing note and vitals reviewed.    ED Treatments / Results  Labs (all labs ordered are listed, but only abnormal results are displayed) Results for orders placed or performed during the hospital encounter of 02/11/17  CBC  Result Value Ref Range   WBC 6.7 4.0 - 10.5 K/uL   RBC 5.10 4.22 - 5.81 MIL/uL   Hemoglobin 13.9 13.0 - 17.0 g/dL   HCT 16.1 09.6 - 04.5 %   MCV 83.7 78.0 - 100.0 fL   MCH 27.3 26.0 - 34.0 pg   MCHC 32.6 30.0 - 36.0 g/dL   RDW  40.9 81.1 - 91.4 %   Platelets 263 150 - 400 K/uL  Basic metabolic panel  Result Value Ref Range   Sodium 133 (L) 135 - 145 mmol/L   Potassium 3.8 3.5 - 5.1 mmol/L   Chloride 100 (L) 101 - 111 mmol/L   CO2 25 22 - 32 mmol/L   Glucose, Bld 106 (H) 65 - 99 mg/dL   BUN 12 6 - 20 mg/dL   Creatinine, Ser 7.82 0.61 - 1.24 mg/dL   Calcium 9.1 8.9 - 95.6 mg/dL   GFR calc non Af Amer >60 >60 mL/min   GFR calc Af Amer >60 >60 mL/min   Anion gap 8 5 - 15   Dg Chest 2 View  Result Date: 02/08/2017 CLINICAL DATA:  Left-sided chest pain for 2 hours EXAM: CHEST  2 VIEW COMPARISON:  01/06/2017 FINDINGS: Chronic interstitial coarsening at the bases. Chronic cardiomegaly and mild aortic tortuosity. There is no edema, consolidation, effusion, or pneumothorax. No acute osseous finding. IMPRESSION: Stable from priors.  No evidence of acute disease. Electronically Signed   By: Marnee SpringJonathon  Watts M.D.   On: 02/08/2017 15:35    EKG  EKG Interpretation None       Radiology No results found.  Procedures Procedures (including critical care time)  Medications Ordered in ED Medications  LORazepam (ATIVAN) tablet 1 mg (not administered)  acetaminophen (TYLENOL) tablet 1,000 mg (not administered)     Initial Impression / Assessment and Plan / ED Course  I have reviewed the triage vital signs and the nursing notes.  Pertinent labs & imaging results that were available during my care of the patient were reviewed by me and considered in my medical decision making (see chart for details).    Pt has ride, does not have to drive.   Patient appears anxious, denies being depressed.  Ativan po.  Acetaminophen po.  Patient comfortable.  bp is now 132/78.    Reviewed nursing notes and prior charts for additional history.   Pt noted w many ED visits w variety somatic complaints - rec close pcp follow up.    Final Clinical Impressions(s) / ED Diagnoses   Final diagnoses:  None    New  Prescriptions New Prescriptions   No medications on file     Cathren LaineSteinl, Kevin, MD 02/11/17 [phone]

## 2017-02-11 NOTE — Discharge Instructions (Signed)
It was our pleasure to provide your ER care today - we hope that you feel better.  Your blood pressure is okay.  For blood pressure, sleep issues, concerns about your medications, and routine medical care - please go to your primary care doctor.  You were given medication in the ER that can cause drowsiness - no driving for the next 6 hours.

## 2017-02-11 NOTE — ED Triage Notes (Signed)
Reports waking up at 0100 with body shaking all over and states I know my blood pressure is high.  Per patient has been taking bp meds.  Also c/o headache at this time.  Reports nausea but no vomiting.

## 2017-02-12 ENCOUNTER — Other Ambulatory Visit: Payer: Self-pay | Admitting: Internal Medicine

## 2017-02-12 LAB — H. PYLORI BREATH TEST: H pylori Breath Test: POSITIVE — AB

## 2017-02-12 MED ORDER — AMOXICILLIN 500 MG PO TABS: 1000.0000 mg | ORAL_TABLET | Freq: Two times a day (BID) | ORAL | 0 refills | Status: DC

## 2017-02-12 MED ORDER — OMEPRAZOLE 20 MG PO CPDR: 20.0000 mg | DELAYED_RELEASE_CAPSULE | Freq: Two times a day (BID) | ORAL | 0 refills | Status: DC

## 2017-02-12 MED ORDER — CLARITHROMYCIN 500 MG PO TABS: 500.0000 mg | ORAL_TABLET | Freq: Two times a day (BID) | ORAL | 0 refills | Status: DC

## 2017-02-12 MED ORDER — OMEPRAZOLE 20 MG PO CPDR
20.0000 mg | DELAYED_RELEASE_CAPSULE | Freq: Two times a day (BID) | ORAL | 0 refills | Status: DC
Start: 2017-02-12 — End: 2017-02-12

## 2017-02-13 ENCOUNTER — Telehealth: Payer: Self-pay

## 2017-02-13 MED FILL — CLARITHROMYCIN 500 MG TAB: 500 | 14 days supply | Qty: 28 | Fill #0

## 2017-02-13 MED FILL — OMEPRAZOLE 20 MG CAPSULE DR: 20 | 14 days supply | Qty: 28 | Fill #0

## 2017-02-13 NOTE — Telephone Encounter (Signed)
CMA call regarding lab results   Patient Verify DOB   Patient was aware and understood  

## 2017-02-13 NOTE — Telephone Encounter (Signed)
-----   Message from Margaretmary LombardNubia K [place], [place] sent at 02/13/2017  3:10 PM EDT ----- Please inform patient of vitamin d being low. Patient may pick up medication at the pharmacy. We will recheck in 4 months.

## 2017-02-13 NOTE — Telephone Encounter (Signed)
[employer] [facility] Id# [id number] Contacted pt to go over lab results pt is aware of results and doesn't have any questions or concerns

## 2017-02-13 NOTE — Progress Notes (Signed)
Please inform patient of vitamin d being low. Patient may pick up medication at the pharmacy. We will recheck in 4 months.

## 2017-02-14 ENCOUNTER — Emergency Department (HOSPITAL_COMMUNITY): Payer: Self-pay

## 2017-02-14 ENCOUNTER — Encounter (HOSPITAL_COMMUNITY): Payer: Self-pay

## 2017-02-14 ENCOUNTER — Other Ambulatory Visit: Payer: Self-pay

## 2017-02-14 DIAGNOSIS — R0789 Other chest pain: Secondary | ICD-10-CM | POA: Insufficient documentation

## 2017-02-14 LAB — BASIC METABOLIC PANEL
Anion gap: 8 (ref 5–15)
BUN: 7 mg/dL (ref 6–20)
CO2: 27 mmol/L (ref 22–32)
Calcium: 9 mg/dL (ref 8.9–10.3)
Chloride: 100 mmol/L — ABNORMAL LOW (ref 101–111)
Creatinine, Ser: 0.77 mg/dL (ref 0.61–1.24)
GFR calc Af Amer: >60 mL/min (ref >60)
GFR calc non Af Amer: >60 mL/min (ref >60)
Glucose, Bld: 118 mg/dL — ABNORMAL HIGH (ref 65–99)
Potassium: 3.6 mmol/L (ref 3.5–5.1)
Sodium: 135 mmol/L (ref 135–145)

## 2017-02-14 LAB — CBC
HCT: 40 % (ref 39.0–52.0)
Hemoglobin: 13 g/dL (ref 13.0–17.0)
MCH: 26.9 pg (ref 26.0–34.0)
MCHC: 32.5 g/dL (ref 30.0–36.0)
MCV: 82.6 fL (ref 78.0–100.0)
Platelets: 244 10*3/uL (ref 150–400)
RBC: 4.84 MIL/uL (ref 4.22–5.81)
RDW: 13.1 % (ref 11.5–15.5)
WBC: 6.7 10*3/uL (ref 4.0–10.5)

## 2017-02-14 LAB — I-STAT TROPONIN, ED: Troponin i, poc: 0 ng/mL (ref 0.00–0.08)

## 2017-02-14 NOTE — ED Triage Notes (Signed)
Pt endorses central and left sided chest pain, denies any other sx. Pt seen 6 days ago for same. VSS.

## 2017-02-15 ENCOUNTER — Emergency Department (HOSPITAL_COMMUNITY)
Admission: EM | Admit: 2017-02-15 | Discharge: 2017-02-15 | Disposition: A | Discharge status: Home / Self Care | Payer: Self-pay | Attending: Emergency Medicine | Admitting: Emergency Medicine

## 2017-02-15 NOTE — ED Notes (Signed)
Patient was called 2x for  Re-assessment of vital signs without an answer.

## 2017-02-15 NOTE — ED Notes (Signed)
Patient called again for re-assessment of vital signs no answer.

## 2017-02-18 ENCOUNTER — Emergency Department (HOSPITAL_COMMUNITY)
Admission: EM | Admit: 2017-02-18 | Discharge: 2017-02-18 | Disposition: A | Discharge status: Home / Self Care | Payer: Self-pay | Attending: Emergency Medicine | Admitting: Emergency Medicine

## 2017-02-18 ENCOUNTER — Other Ambulatory Visit: Payer: Self-pay

## 2017-02-18 ENCOUNTER — Encounter (HOSPITAL_COMMUNITY): Payer: Self-pay

## 2017-02-18 DIAGNOSIS — F1010 Alcohol abuse, uncomplicated: Secondary | ICD-10-CM | POA: Insufficient documentation

## 2017-02-18 DIAGNOSIS — I10 Essential (primary) hypertension: Secondary | ICD-10-CM | POA: Insufficient documentation

## 2017-02-18 DIAGNOSIS — F1410 Cocaine abuse, uncomplicated: Secondary | ICD-10-CM | POA: Insufficient documentation

## 2017-02-18 DIAGNOSIS — R5383 Other fatigue: Secondary | ICD-10-CM | POA: Insufficient documentation

## 2017-02-18 DIAGNOSIS — Z79899 Other long term (current) drug therapy: Secondary | ICD-10-CM | POA: Insufficient documentation

## 2017-02-18 DIAGNOSIS — R42 Dizziness and giddiness: Secondary | ICD-10-CM | POA: Insufficient documentation

## 2017-02-18 LAB — I-STAT CHEM 8, ED
BUN: 5 mg/dL — ABNORMAL LOW (ref 6–20)
Calcium, Ion: 1.14 mmol/L — ABNORMAL LOW (ref 1.15–1.40)
Chloride: 99 mmol/L — ABNORMAL LOW (ref 101–111)
Creatinine, Ser: 0.7 mg/dL (ref 0.61–1.24)
Glucose, Bld: 81 mg/dL (ref 65–99)
HCT: 44 % (ref 39.0–52.0)
Hemoglobin: 15 g/dL (ref 13.0–17.0)
Potassium: 3.5 mmol/L (ref 3.5–5.1)
Sodium: 138 mmol/L (ref 135–145)
TCO2: 27 mmol/L (ref 0–100)

## 2017-02-18 MED ORDER — ACETAMINOPHEN 500 MG PO TABS
1000.0000 mg | ORAL_TABLET | Freq: Once | ORAL | Status: AC
  Administered 2017-02-18: 1000 mg via ORAL
  Filled 2017-02-18: qty 2

## 2017-02-18 NOTE — Discharge Instructions (Signed)
Read the information below.  You may return to the Emergency Department at any time for worsening condition or any new symptoms that concern you. °

## 2017-02-18 NOTE — ED Triage Notes (Signed)
Patient complains of ongoing HTN, has been seen many times for same. This am on arrival BP nolrmal. apears anxious. NAD

## 2017-02-18 NOTE — ED Notes (Signed)
Family at bedside. 

## 2017-02-18 NOTE — ED Notes (Signed)
Patient is resting comfortably. 

## 2017-02-18 NOTE — ED Provider Notes (Signed)
[facility]-EMERGENCY DEPT Provider Note   CSN: [id number] Arrival date & time: 02/18/17  1102     History   Chief Complaint Chief Complaint  Patient presents with  . Hypertension    HPI Wesley [name] is a 51 y.o. male.  HPI   Pt with hx anxiety, chronic pain, polysubstance abuse, HTN, frequent visits to the ED p/w feeling low energy, decreased appetite, somewhat "dizzy" this morning.  He also wants to know if he can take flexeril, vistaril, and HCTZ at the same time.  He has been seen in our ED 11 out of the past 21 days and apparently was [facility] at [facility] days ago and prescribed flexeril for upper back muscle spasms.  States he has been taking the flexeril and this is helping but states he has felt more fatigued and dizzy since.  Denies fevers, CP, SOB, cough.    Past Medical History:  Diagnosis Date  . Alcohol abuse   . Anxiety   . Chronic neck pain   . Cocaine abuse   . Hypertension     Patient Active Problem List   Diagnosis Date Noted  . 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 Dizziness 01/23/2017  . Back pain 11/27/2016  . Bilateral posterior neck pain 11/27/2016  . Epigastric abdominal pain 11/21/2016  . T wave inversion in EKG 11/21/2016  . Nausea 11/21/2016  . Anxiety 11/21/2016    Past Surgical History:  Procedure Laterality Date  . EYE SURGERY Right ~ 1980   "hit my eye"       Home Medications    Prior to Admission medications   Medication Sig Start Date End Date Taking? Authorizing Provider  amoxicillin (AMOXIL) 500 MG tablet Take 2 tablets (1,000 mg total) by mouth 2 (two) times daily. 02/12/17   Marcine MatarJohnson, Deborah B, MD  clarithromycin (BIAXIN) 500 MG tablet Take 1 tablet (500 mg total) by mouth 2 (two) times daily. 02/12/17   Marcine MatarJohnson, Deborah B, MD  diazepam (VALIUM) 5 MG tablet Take 5 mg by mouth daily.    [provider]  escitalopram (LEXAPRO) 10 MG tablet Take 1 tablet (10 mg total) by mouth daily. 02/09/17   Marcine MatarJohnson, Deborah B, MD  famotidine (PEPCID) 20 MG  tablet Take 1 tablet (20 mg total) by mouth 2 (two) times daily. Patient not taking: Reported on 02/04/2017 01/31/17   Lorre NickAllen, Anthony, MD  hydrochlorothiazide (HYDRODIURIL) 12.5 MG tablet One a day 02/08/17   Anders SimmondsMcClung, Angela M, PA-C  hydrOXYzine (ATARAX/VISTARIL) 25 MG tablet Take 1 tablet (25 mg total) by mouth 2 (two) times daily as needed for anxiety. 02/02/17   Marcine MatarJohnson, Deborah B, MD  meclizine (ANTIVERT) 12.5 MG tablet Take 1 tablet (12.5 mg total) by mouth 3 (three) times daily as needed for dizziness. Patient not taking: Reported on 02/08/2017 01/14/17   Michela PitcherFawze, Mina A, PA-C  metoCLOPramide (REGLAN) 10 MG tablet Take 1 tablet (10 mg total) by mouth every 8 (eight) hours as needed for nausea. 01/14/17 02/08/17  Michela PitcherFawze, Mina A, PA-C  omeprazole (PRILOSEC) 20 MG capsule Take 1 capsule (20 mg total) by mouth daily. 02/03/17   Mackuen, Courteney Lyn, MD  omeprazole (PRILOSEC) 20 MG capsule Take 1 capsule (20 mg total) by mouth 2 (two) times daily before a meal. 02/12/17   Marcine MatarJohnson, Deborah B, MD  sucralfate (CARAFATE) 1 g tablet Take 1 tablet (1 g total) by mouth 4 (four) times daily. Patient not taking: Reported on 02/02/2017 01/31/17   Lorre NickAllen, Anthony, MD  Vitamin D, Ergocalciferol, (DRISDOL)  50000 units CAPS capsule Take 1 capsule (50,000 Units total) by mouth every 7 (seven) days. 02/09/17   Anders Simmonds, PA-C    Family History Family History  Problem Relation Age of Onset  . Hypertension Mother   . Hypertension Father     Social History Social History  Substance Use Topics  . Smoking status: Never Smoker  . Smokeless tobacco: Never Used  . Alcohol use No     Comment: 11/21/2016 "last drank 06/2016"; denies      Allergies   Motrin [ibuprofen] and Zofran [ondansetron hcl]   Review of Systems Review of Systems  All other systems reviewed and are negative.    Physical Exam Updated Vital Signs BP 117/77   Pulse 90   Temp 98 F (36.7 C) (Oral)   Resp (!) 21   SpO2 99%   Physical  Exam  Constitutional: He appears well-developed and well-nourished. No distress.  HENT:  Head: Normocephalic and atraumatic.  Neck: Neck supple.  Cardiovascular: Normal rate and regular rhythm.   Pulmonary/Chest: Effort normal and breath sounds normal. No respiratory distress. He has no wheezes. He has no rales.  Abdominal: Soft. He exhibits no distension and no mass. There is no tenderness. There is no rebound and no guarding.  Neurological: He is alert. He exhibits normal muscle tone.  Skin: He is not diaphoretic.  Psychiatric: His mood appears anxious.  Nursing note and vitals reviewed.    ED Treatments / Results  Labs (all labs ordered are listed, but only abnormal results are displayed) Labs Reviewed  I-STAT CHEM 8, ED - Abnormal; Notable for the following:       Result Value   Chloride 99 (*)    BUN 5 (*)    Calcium, Ion 1.14 (*)    All other components within normal limits    EKG  EKG Interpretation None       Radiology No results found.  Procedures Procedures (including critical care time)  Medications Ordered in ED Medications  acetaminophen (TYLENOL) tablet 1,000 mg (1,000 mg Oral Given 02/18/17 1412)     Initial Impression / Assessment and Plan / ED Course  I have reviewed the triage vital signs and the nursing notes.  Pertinent labs & imaging results that were available during my care of the patient were reviewed by me and considered in my medical decision making (see chart for details).    Afebrile, nontoxic patient with anxiety, polysubstance abuse, HTN with frequent visits to ED requesting information about taking his medications, also noting he is feeling fatigued, dizzy (something he is seen for frequently), and decreased appetite.  I suspect this is related to his recent addition of flexeril to his regimen.  He has no focal symptoms or findings on exam that are concerning for acute event or infection.  Chem 8 unremarkable.  EKG unchanged.  Pt  reporting his typical muscle pain/headache, requesting medication, tylenol given.   D/C home with close PCP follow up.  Discussed result, findings, treatment, and follow up  with patient.  Pt given return precautions.  Pt verbalizes understanding and agrees with plan.       Final Clinical Impressions(s) / ED Diagnoses   Final diagnoses:  Fatigue, unspecified type    New Prescriptions Discharge Medication List as of 02/18/2017  1:48 PM       Trixie Dredge, PA-C 02/18/17 1420    Vanetta Mulders, MD 02/19/17 0830

## 2017-02-19 ENCOUNTER — Telehealth: Payer: Self-pay | Admitting: Internal Medicine

## 2017-02-19 NOTE — Telephone Encounter (Signed)
Called and left patient a message at [phone]  asking him to return my call. I called to check on patient status and give him information on the resources available to him.

## 2017-02-20 ENCOUNTER — Telehealth: Payer: Self-pay | Admitting: Internal Medicine

## 2017-02-20 ENCOUNTER — Other Ambulatory Visit: Payer: Self-pay

## 2017-02-20 ENCOUNTER — Ambulatory Visit (HOSPITAL_COMMUNITY)
Admission: EM | Admit: 2017-02-20 | Discharge: 2017-02-20 | Disposition: A | Discharge status: Home / Self Care | Payer: Self-pay | Attending: Family Medicine | Admitting: Family Medicine

## 2017-02-20 ENCOUNTER — Encounter (HOSPITAL_COMMUNITY): Payer: Self-pay | Admitting: *Deleted

## 2017-02-20 DIAGNOSIS — I10 Essential (primary) hypertension: Secondary | ICD-10-CM

## 2017-02-20 DIAGNOSIS — R42 Dizziness and giddiness: Secondary | ICD-10-CM

## 2017-02-20 MED ORDER — MECLIZINE HCL 25 MG PO TABS: 25.0000 mg | ORAL_TABLET | Freq: Three times a day (TID) | ORAL | 0 refills | Status: DC | PRN

## 2017-02-20 NOTE — ED Provider Notes (Signed)
CSN: [id number]     Arrival date & time 02/20/17  1312 History   None    Chief Complaint  Patient presents with  . Medication Reaction   (Consider location/radiation/quality/duration/timing/severity/associated sxs/prior Treatment) Patient c/o dizziness and states he thinks his bp meds are causing.        Past Medical History:  Diagnosis Date  . Alcohol abuse   . Anxiety   . Chronic neck pain   . Cocaine abuse   . Hypertension    Past Surgical History:  Procedure Laterality Date  . EYE SURGERY Right ~ 1980   "hit my eye"   Family History  Problem Relation Age of Onset  . Hypertension Mother   . Hypertension Father    Social History  Substance Use Topics  . Smoking status: Never Smoker  . Smokeless tobacco: Never Used  . Alcohol use No     Comment: 11/21/2016 "last drank 06/2016"; denies     Review of Systems  Constitutional: Negative.   HENT: Negative.   Eyes: Negative.   Respiratory: Negative.   Cardiovascular: Negative.   Gastrointestinal: Negative.   Endocrine: Negative.   Genitourinary: Negative.   Musculoskeletal: Positive for arthralgias.  Allergic/Immunologic: Negative.   Neurological: Negative.   Hematological: Negative.   Psychiatric/Behavioral: Negative.     Allergies  Motrin [ibuprofen] and Zofran [ondansetron hcl]  Home Medications   Prior to Admission medications   Medication Sig Start Date End Date Taking? Authorizing Provider  amoxicillin (AMOXIL) 500 MG tablet Take 2 tablets (1,000 mg total) by mouth 2 (two) times daily. 02/12/17   Marcine Matar, MD  clarithromycin (BIAXIN) 500 MG tablet Take 1 tablet (500 mg total) by mouth 2 (two) times daily. 02/12/17   Marcine Matar, MD  diazepam (VALIUM) 5 MG tablet Take 5 mg by mouth daily.    [provider]  escitalopram (LEXAPRO) 10 MG tablet Take 1 tablet (10 mg total) by mouth daily. 02/09/17   Marcine Matar, MD  famotidine (PEPCID) 20 MG tablet Take 1 tablet (20 mg total)  by mouth 2 (two) times daily. Patient not taking: Reported on 02/04/2017 01/31/17   Lorre Nick, MD  hydrochlorothiazide (HYDRODIURIL) 12.5 MG tablet One a day 02/08/17   Anders Simmonds, PA-C  hydrOXYzine (ATARAX/VISTARIL) 25 MG tablet Take 1 tablet (25 mg total) by mouth 2 (two) times daily as needed for anxiety. 02/02/17   Marcine Matar, MD  meclizine (ANTIVERT) 12.5 MG tablet Take 1 tablet (12.5 mg total) by mouth 3 (three) times daily as needed for dizziness. Patient not taking: Reported on 02/08/2017 01/14/17   Michela Pitcher A, PA-C  meclizine (ANTIVERT) 25 MG tablet Take 1 tablet (25 mg total) by mouth 3 (three) times daily as needed for dizziness. 02/20/17   Deatra Canter, FNP  metoCLOPramide (REGLAN) 10 MG tablet Take 1 tablet (10 mg total) by mouth every 8 (eight) hours as needed for nausea. 01/14/17 02/08/17  Michela Pitcher A, PA-C  omeprazole (PRILOSEC) 20 MG capsule Take 1 capsule (20 mg total) by mouth daily. 02/03/17   Mackuen, Courteney Lyn, MD  omeprazole (PRILOSEC) 20 MG capsule Take 1 capsule (20 mg total) by mouth 2 (two) times daily before a meal. 02/12/17   Marcine Matar, MD  sucralfate (CARAFATE) 1 g tablet Take 1 tablet (1 g total) by mouth 4 (four) times daily. Patient not taking: Reported on 02/02/2017 01/31/17   Lorre Nick, MD  Vitamin D, Ergocalciferol, (DRISDOL) 50000 units CAPS capsule  Take 1 capsule (50,000 Units total) by mouth every 7 (seven) days. 02/09/17   Anders SimmondsMcClung, Angela M, PA-C   Meds Ordered and Administered this Visit  Medications - No data to display  BP (!) 143/100 (BP Location: Right Arm)   Pulse 82   Temp 98.6 F (37 C) (Oral)   Resp 18   SpO2 100%  No data found.   Physical Exam  Constitutional: He is oriented to person, place, and time. He appears well-developed and well-nourished.  HENT:  Head: Normocephalic and atraumatic.  Right Ear: External ear normal.  Left Ear: External ear normal.  Mouth/Throat: Oropharynx is clear and moist.   Eyes: Conjunctivae and EOM are normal. Pupils are equal, round, and reactive to light.  Neck: Normal range of motion. Neck supple.  Cardiovascular: Normal rate and regular rhythm.   Pulmonary/Chest: Effort normal and breath sounds normal.  Abdominal: Soft. Bowel sounds are normal.  Neurological: He is alert and oriented to person, place, and time.  Nursing note and vitals reviewed.   Urgent Care Course     Procedures (including critical care time)  Labs Review Labs Reviewed - No data to display  Imaging Review No results found.   Visual Acuity Review  Right Eye Distance:   Left Eye Distance:   Bilateral Distance:    Right Eye Near:   Left Eye Near:    Bilateral Near:         MDM   1. Dizziness and giddiness   2. Essential hypertension    Need to follow up with PCP for HTN Reassured bp is normal  Meclizine 25mg  one po tid prn dizziness  Stop flexeril and see if this is causing dizziness.      Deatra CanterOxford, William J, FNP 02/20/17 1517

## 2017-02-20 NOTE — Telephone Encounter (Signed)
Lincoln from [facility] returned my call to inform me that the best option is for patient to go to [facility] for his anxiety. Although he understands that the waiting period for patients is long, it is best for Mr. Royetta Asalolasco to go there considering that he doesn't have insurance. Francesco SorLincoln also recommended that patient contact [facility] if he doesn't want to go to [facility].   Francesco SorLincoln will be contacting the SW in mental health dep to see what other community resources are available to patients without insurance and will call me.

## 2017-02-20 NOTE — Telephone Encounter (Signed)
Called and spoke with patient (in Spanish) in regards to follow up on his condition and resources available to him. Pt stated that he was at the hospital yesterday (although couldn't remember the name of it) but was not feeling well today. He said that he had a weird feeling all over his body specifically his muscles and that it may be from his medication but wasn't sure. Scheduled patient an appt to see a provider tomorrow.  I asked him if he had scheduled an appt with [facility] (as per provider and LCSW recommendation) and with our financial counselor, at first he stated that he was never given any information only for AA which he hasn't called yet but then he did remember receiving information to [facility] but hasn't scheduled any appts. Verified with patient his language preference and he stated Spanish. He understands little [place]. Scheduled him an appt with financial counselor.

## 2017-02-20 NOTE — ED Triage Notes (Signed)
Pt  Reports      Feels   Nervous   And  Blood  Pressure  Was  Elevated  Earlier

## 2017-02-21 ENCOUNTER — Ambulatory Visit: Payer: Self-pay | Attending: Internal Medicine | Admitting: Physician Assistant

## 2017-02-21 ENCOUNTER — Ambulatory Visit: Payer: Self-pay | Attending: Internal Medicine

## 2017-02-21 ENCOUNTER — Encounter: Payer: Self-pay | Admitting: Physician Assistant

## 2017-02-21 VITALS — BP 136/93 | HR 117 | Temp 98.6°F | Resp 16 | Wt 186.0 lb

## 2017-02-21 DIAGNOSIS — I10 Essential (primary) hypertension: Secondary | ICD-10-CM

## 2017-02-21 DIAGNOSIS — Z79899 Other long term (current) drug therapy: Secondary | ICD-10-CM | POA: Insufficient documentation

## 2017-02-21 DIAGNOSIS — F419 Anxiety disorder, unspecified: Secondary | ICD-10-CM

## 2017-02-21 MED ORDER — HYDROXYZINE HCL 25 MG PO TABS: 25.0000 mg | ORAL_TABLET | Freq: Two times a day (BID) | ORAL | 1 refills | Status: DC | PRN

## 2017-02-21 MED ORDER — AMLODIPINE BESYLATE 5 MG PO TABS: 5.0000 mg | ORAL_TABLET | Freq: Every day | ORAL | 3 refills | Status: DC

## 2017-02-21 MED FILL — ?HYDROXYZINE HCL 25 MG TAB: 25 MG | 30 days supply | Qty: 60 | Fill #0

## 2017-02-21 MED FILL — AMLODIPINE BESYLATE 5 MG TA: 5 | 30 days supply | Qty: 30 | Fill #0

## 2017-02-23 ENCOUNTER — Encounter (HOSPITAL_COMMUNITY): Payer: Self-pay

## 2017-02-23 ENCOUNTER — Emergency Department (HOSPITAL_COMMUNITY)
Admission: EM | Admit: 2017-02-23 | Discharge: 2017-02-23 | Disposition: A | Discharge status: Home / Self Care | Payer: Self-pay | Attending: Emergency Medicine | Admitting: Emergency Medicine

## 2017-02-23 DIAGNOSIS — I10 Essential (primary) hypertension: Secondary | ICD-10-CM

## 2017-02-23 DIAGNOSIS — Z79899 Other long term (current) drug therapy: Secondary | ICD-10-CM | POA: Insufficient documentation

## 2017-02-23 DIAGNOSIS — F419 Anxiety disorder, unspecified: Secondary | ICD-10-CM

## 2017-02-23 DIAGNOSIS — Z5321 Procedure and treatment not carried out due to patient leaving prior to being seen by health care provider: Secondary | ICD-10-CM | POA: Insufficient documentation

## 2017-02-23 DIAGNOSIS — Z7902 Long term (current) use of antithrombotics/antiplatelets: Secondary | ICD-10-CM | POA: Insufficient documentation

## 2017-02-23 LAB — CBG MONITORING, ED: Glucose-Capillary: 130 mg/dL — ABNORMAL HIGH (ref 65–99)

## 2017-02-23 MED ORDER — INSULIN REGULAR HUMAN 100 UNIT/ML IJ SOLN
INTRAMUSCULAR | Status: DC
  Filled 2017-02-23: qty 1

## 2017-02-23 NOTE — ED Notes (Signed)
Pt was not in room when this RN went in to discharge. Nursing secrectary to call pt for dc paperwork.

## 2017-02-23 NOTE — Progress Notes (Signed)
Kalmen Lollar, is a 51 y.o. male  ZOX:[id number]  WJX:[id number]  DOB - 05-27-1966  Subjective:  Chief Complaint and HPI: Jourdon Zimmerle is a 51 y.o. male here today for concerns about his BP meds and anxiety.  He has been frequently to the ED within [facility], at [facility], and other urgent cares and locations for anxiety, CP, dizziness, SOB.  He really feels that his 12.5mg  HCTZ is causing him anxiety and blood pressure problems.  He is excessively worried that 12.5mg  HCTZ is too high of a dose for him.  He has been reassured multiple times by multiple providers about this to no avail.  He eventually tells me the reason he continues to go to the ED is that often they give him "a little yellow pill" that really helps his symptoms.  It is of note that he has been sober of alcohol and cocaine for >1 year without any ongoing program of recovery/individual or group support for his sobriety.  His anxiety and worry get unmanagemble and so he goes to the ED for relief.  I have again(many of [place] have)at length to attend AA, [facility](for sustainable and safe medication options), group or individual therapy to help with his anxiety.  Depression screen [facility] 2/9 02/21/2017 02/08/2017 02/02/2017  Decreased Interest 3 3 0  Down, Depressed, Hopeless 3 3 0  PHQ - 2 Score 6 6 0  Altered sleeping 3 2 0  Tired, decreased energy 3 2 0  Change in appetite 3 0 0  Feeling bad or failure about yourself  3 3 0  Trouble concentrating 3 3 0  Moving slowly or fidgety/restless 3 0 0  Suicidal thoughts 3 0 0  PHQ-9 Score 27 16 0   He denies true desire or plan to harm himself.  He just wants his anxiety to go away.  His girlfriend is with him and is supportive.  He has no plan to harm himself. No weapons in the home.  ROS:   Constitutional:  No f/c, No night sweats, No unexplained weight loss. EENT:  No vision changes, No blurry vision, No hearing changes. No mouth, throat, or ear problems.  Respiratory: No cough, No SOB Cardiac:  No CP, no palpitations GI:  No abd pain, No N/V/D. GU: No Urinary s/sx Musculoskeletal: No joint pain Neuro: No headache, no dizziness, no motor weakness.  Skin: No rash Endocrine:  No polydipsia. No polyuria.  Psych: Denies SI/HI  No problems updated.  ALLERGIES: Allergies  Allergen Reactions  . Motrin [Ibuprofen] Other (See Comments)    Makes his throat get "very dry" after taking it  . Zofran [Ondansetron Hcl] Other (See Comments)    Sweating and body feels bad    PAST MEDICAL HISTORY: Past Medical History:  Diagnosis Date  . Alcohol abuse   . Anxiety   . Chronic neck pain   . Cocaine abuse   . Hypertension     MEDICATIONS AT HOME: Prior to Admission medications   Medication Sig Start Date End Date Taking? Authorizing Provider  amLODipine (NORVASC) 5 MG tablet Take 1 tablet (5 mg total) by mouth daily. 02/21/17   Anders Simmonds, PA-C  clarithromycin (BIAXIN) 500 MG tablet Take 1 tablet (500 mg total) by mouth 2 (two) times daily. Patient not taking: Reported on 02/21/2017 02/12/17   Marcine Matar, MD  diazepam (VALIUM) 5 MG tablet Take 5 mg by mouth daily.    [provider]  escitalopram (LEXAPRO) 10 MG tablet Take 1  tablet (10 mg total) by mouth daily. 02/09/17   Marcine Matar, MD  famotidine (PEPCID) 20 MG tablet Take 1 tablet (20 mg total) by mouth 2 (two) times daily. Patient not taking: Reported on 02/04/2017 01/31/17   Lorre Nick, MD  hydrOXYzine (ATARAX/VISTARIL) 25 MG tablet Take 1 tablet (25 mg total) by mouth 2 (two) times daily as needed for anxiety. 02/21/17   Anders Simmonds, PA-C  meclizine (ANTIVERT) 25 MG tablet Take 1 tablet (25 mg total) by mouth 3 (three) times daily as needed for dizziness. 02/20/17   Deatra Canter, FNP  metoCLOPramide (REGLAN) 10 MG tablet Take 1 tablet (10 mg total) by mouth every 8 (eight) hours as needed for nausea. 01/14/17 02/08/17  Michela Pitcher A, PA-C  omeprazole (PRILOSEC) 20 MG capsule Take 1 capsule (20  mg total) by mouth daily. 02/03/17   Mackuen, Courteney Lyn, MD  omeprazole (PRILOSEC) 20 MG capsule Take 1 capsule (20 mg total) by mouth 2 (two) times daily before a meal. 02/12/17   Marcine Matar, MD  Vitamin D, Ergocalciferol, (DRISDOL) 50000 units CAPS capsule Take 1 capsule (50,000 Units total) by mouth every 7 (seven) days. 02/09/17   Anders Simmonds, PA-C     Objective:  EXAM:   Vitals:   02/21/17 1532  BP: (!) 136/93  Pulse: (!) 117  Resp: 16  Temp: 98.6 F (37 C)  TempSrc: Oral  SpO2: 96%  Weight: 186 lb (84.4 kg)    General appearance : A&OX3. NAD. Non-toxic-appearing HEENT: Atraumatic and Normocephalic.  PERRLA. EOM intact.  TM clear B. Mouth-MMM, post pharynx WNL w/o erythema, No PND. Neck: supple, no JVD. No cervical lymphadenopathy. No thyromegaly Chest/Lungs:  Breathing-non-labored, Good air entry bilaterally, breath sounds normal without rales, rhonchi, or wheezing  CVS: S1 S2 regular, no murmurs, gallops, rubs  Abdomen: Bowel sounds present, Non tender and not distended with no gaurding, rigidity or rebound. Extremities: Bilateral Lower Ext shows no edema, both legs are warm to touch with = pulse throughout Neurology:  CN II-XII grossly intact, Non focal.   Psych:  TP linear. J/I WNL. Normal speech. Appropriate eye contact and affect.  Skin:  No Rash  Data Review Lab Results  Component Value Date   HGBA1C 5.8 (H) 11/21/2016     Assessment & Plan   1. Hypertension, unspecified type Stop HCTZ.  He really feels that HCTZ is contributing to his anxiety, so we will try something else and see if this will improve his compliance and decrease his symptoms/concerns with HCTZ. - amLODipine (NORVASC) 5 MG tablet; Take 1 tablet (5 mg total) by mouth daily.  Dispense: 90 tablet; Refill: 3  2. Anxiety disorder, unspecified type He continues to go to the ED frequently/excessively.  He reveals to me that part of the reason he goes there is to get a "little yellow  pill" that calms his anxiety.  He would greatly benefit from 12 step recovery as this has been shown to reduce anxiety in patients who have suffered from addiction. He has been sober of alcohol and cocaine for >1year.  I have explained to him that his risk of relapse at this point is very high without an ongoing program of recovery and anxiety management. There are several spanish-speaking meetings weekly in [place] in addition to [employer] weekly in [place].  I have provided him with this information and urged him to attend.  He should also use the other resources we have given to  help with anxiety management such as [facility], behavioral health, counseling, etc.  I have also suggested he might consider inpatient treatment at [facility] in order to find a medication regimen that might be successful.  I spent 40mins face to face with him about all of the above.  - hydrOXYzine (ATARAX/VISTARIL) 25 MG tablet; Take 1 tablet (25 mg total) by mouth 2 (two) times daily as needed for anxiety.  Dispense: 60 tablet; Refill: 1     Patient have been counseled extensively about nutrition and exercise  Return in about 1 week (around 02/28/2017) for dr Laural Benesjohnson; f/up BP and anxiety.  The patient was given clear instructions to go to ER or return to medical center if symptoms don't improve, worsen or new problems develop. The patient verbalized understanding. The patient was told to call to get lab results if they haven't heard anything in the next week.     Georgian CoAngela McClung, PA-C [facility] [place], [place] [phone]   02/23/2017, 8:47 AMPatient ID: Johnnette LitterJose Raczka, male   DOB: Feb 01, 1966, 51 y.o.   MRN: [id number]

## 2017-02-23 NOTE — ED Notes (Signed)
Pt departed prior to receiving d/c instructions or signing for d/c. Last seen in room and in NAD.

## 2017-02-23 NOTE — ED Triage Notes (Signed)
Pt complaining of flushed face. Pt denies any headache or pain. Pt a/o x 4 at triage, NAD. VSS.

## 2017-02-23 NOTE — ED Provider Notes (Signed)
[facility] DEPT Provider Note   CSN: [id number] Arrival date & time: 02/23/17  0734     History   Chief Complaint Chief Complaint  Patient presents with  . Hypertension    HPI Wesley [name] is a 51 y.o. male.  HPI  51 year old man history of alcohol abuse, anxiety, and high blood pressure who presents today stating that he felt dizzy this morning. Upon questioning, his dizziness consider consisted of feeling anxious. He has recently had his antihypertensive change from lisinopril to amlodipine. He was concerned that his blood pressure was elevated. He does not have a cuff at home. He denies any other associated symptoms such as headache, vision change, chest pain, or dyspnea. Although he has a history of alcohol abuse he states he has not been drinking for over a year. He denies any substance abuse. He has been taking his medications as prescribed. He goes to [facility]. He reports that his medication was changed 2 days ago.  Past Medical History:  Diagnosis Date  . Alcohol abuse   . Anxiety   . Chronic neck pain   . Cocaine abuse   . Hypertension     Patient Active Problem List   Diagnosis Date Noted  . 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 Dizziness 01/23/2017  . Back pain 11/27/2016  . Bilateral posterior neck pain 11/27/2016  . Epigastric abdominal pain 11/21/2016  . T wave inversion in EKG 11/21/2016  . Nausea 11/21/2016  . Anxiety 11/21/2016    Past Surgical History:  Procedure Laterality Date  . EYE SURGERY Right ~ 1980   "hit my eye"       Home Medications    Prior to Admission medications   Medication Sig Start Date End Date Taking? Authorizing Provider  amLODipine (NORVASC) 5 MG tablet Take 1 tablet (5 mg total) by mouth daily. 02/21/17   Anders Simmonds, PA-C  clarithromycin (BIAXIN) 500 MG tablet Take 1 tablet (500 mg total) by mouth 2 (two) times daily. Patient not taking: Reported on 02/21/2017 02/12/17   Marcine Matar, MD    diazepam (VALIUM) 5 MG tablet Take 5 mg by mouth daily.    [provider]  escitalopram (LEXAPRO) 10 MG tablet Take 1 tablet (10 mg total) by mouth daily. 02/09/17   Marcine Matar, MD  famotidine (PEPCID) 20 MG tablet Take 1 tablet (20 mg total) by mouth 2 (two) times daily. Patient not taking: Reported on 02/04/2017 01/31/17   Lorre Nick, MD  hydrOXYzine (ATARAX/VISTARIL) 25 MG tablet Take 1 tablet (25 mg total) by mouth 2 (two) times daily as needed for anxiety. 02/21/17   Anders Simmonds, PA-C  meclizine (ANTIVERT) 25 MG tablet Take 1 tablet (25 mg total) by mouth 3 (three) times daily as needed for dizziness. 02/20/17   Deatra Canter, FNP  metoCLOPramide (REGLAN) 10 MG tablet Take 1 tablet (10 mg total) by mouth every 8 (eight) hours as needed for nausea. 01/14/17 02/08/17  Michela Pitcher A, PA-C  omeprazole (PRILOSEC) 20 MG capsule Take 1 capsule (20 mg total) by mouth daily. 02/03/17   Mackuen, Courteney Lyn, MD  omeprazole (PRILOSEC) 20 MG capsule Take 1 capsule (20 mg total) by mouth 2 (two) times daily before a meal. 02/12/17   Marcine Matar, MD  Vitamin D, Ergocalciferol, (DRISDOL) 50000 units CAPS capsule Take 1 capsule (50,000 Units total) by mouth every 7 (seven) days. 02/09/17   Anders Simmonds, PA-C    Family History Family History  Problem Relation Age of Onset  . Hypertension Mother   . Hypertension Father     Social History Social History  Substance Use Topics  . Smoking status: Never Smoker  . Smokeless tobacco: Never Used  . Alcohol use No     Comment: 11/21/2016 "last drank 06/2016"; denies      Allergies   Motrin [ibuprofen] and Zofran [ondansetron hcl]   Review of Systems Review of Systems  Psychiatric/Behavioral: The patient is nervous/anxious.   All other systems reviewed and are negative.    Physical Exam Updated Vital Signs BP (!) 125/96 (BP Location: Right Arm)   Pulse 86   Temp 98.5 F (36.9 C) (Oral)   Resp 17   Ht 1.676 m  (5\' 6" )   Wt 83.9 kg (185 lb)   SpO2 100%   BMI 29.86 kg/m   Physical Exam  Constitutional: He is oriented to person, place, and time. He appears well-developed and well-nourished.  HENT:  Head: Normocephalic and atraumatic.  Right Ear: External ear normal.  Left Ear: External ear normal.  Nose: Nose normal.  Eyes: Pupils are equal, round, and reactive to light.  Neck: Normal range of motion.  Cardiovascular: Normal rate and regular rhythm.   Pulmonary/Chest: Effort normal and breath sounds normal.  Abdominal: Soft. Bowel sounds are normal.  Musculoskeletal: Normal range of motion.  Neurological: He is alert and oriented to person, place, and time. He displays normal reflexes. No cranial nerve deficit. Coordination normal.  Skin: Skin is warm and dry.  Psychiatric: He has a normal mood and affect.  Nursing note and vitals reviewed.    ED Treatments / Results  Labs (all labs ordered are listed, but only abnormal results are displayed) Labs Reviewed - No data to display  EKG  EKG Interpretation None       Radiology No results found.  Procedures Procedures (including critical care time)  Medications Ordered in ED Medications - No data to display   Initial Impression / Assessment and Plan / ED Course  I have reviewed the triage vital signs and the nursing notes.  Pertinent labs & imaging results that were available during my care of the patient were reviewed by me and considered in my medical decision making (see chart for details).     Patient with some anxiety and concern is hypertensive. Blood pressure here is 125/96. He is taking his medications as prescribed. We have discussed life style modifications and need for follow-up. He voices understanding. He will keep a log of his blood pressure follow primary care doctor.  Final Clinical Impressions(s) / ED Diagnoses   Final diagnoses:  Hypertension, unspecified type  Anxiety    New Prescriptions New  Prescriptions   No medications on file     Margarita Grizzleay, Danielle, MD 02/23/17 [phone]

## 2017-02-23 NOTE — ED Triage Notes (Signed)
Pt reports he thinks his BP is elevated and feels anxious. He arrives with his Hydroxyzine and Amlodipine medications and states he took the Amlodipine yesterday and the Hydroxyzine two days ago. He denies being in any pain. Pt A&OX4.

## 2017-02-24 ENCOUNTER — Emergency Department (HOSPITAL_COMMUNITY)
Admission: EM | Admit: 2017-02-24 | Discharge: 2017-02-24 | Disposition: A | Discharge status: Home / Self Care | Payer: Self-pay | Attending: Emergency Medicine | Admitting: Emergency Medicine

## 2017-02-24 NOTE — ED Notes (Signed)
Pt called for vital recheck with no answer 

## 2017-02-24 NOTE — ED Notes (Signed)
Pt called for the third time with no response

## 2017-02-24 NOTE — ED Notes (Signed)
Pt called in waiting room no answer. Pt moved back to waiting room.

## 2017-02-25 ENCOUNTER — Encounter (HOSPITAL_COMMUNITY): Payer: Self-pay | Admitting: Emergency Medicine

## 2017-02-25 ENCOUNTER — Emergency Department (HOSPITAL_COMMUNITY)
Admission: EM | Admit: 2017-02-25 | Discharge: 2017-02-25 | Disposition: A | Discharge status: Home / Self Care | Payer: Self-pay | Attending: Emergency Medicine | Admitting: Emergency Medicine

## 2017-02-25 DIAGNOSIS — Z79899 Other long term (current) drug therapy: Secondary | ICD-10-CM | POA: Insufficient documentation

## 2017-02-25 DIAGNOSIS — I10 Essential (primary) hypertension: Secondary | ICD-10-CM | POA: Insufficient documentation

## 2017-02-25 DIAGNOSIS — G8929 Other chronic pain: Secondary | ICD-10-CM | POA: Insufficient documentation

## 2017-02-25 MED ORDER — HYDROCHLOROTHIAZIDE 12.5 MG PO TABS: 12.5000 mg | ORAL_TABLET | Freq: Every day | ORAL | 1 refills | Status: DC

## 2017-02-25 NOTE — ED Provider Notes (Signed)
[facility] Provider Note   CSN: [id number] Arrival date & time: 02/25/17  1522     History   Chief Complaint Chief Complaint  Patient presents with  . Hypertension    HPI Wesley [name] is a 51 y.o. male.  Pt complains of his blood pressure being high today.     The history is provided by the patient. No language interpreter was used.  Hypertension  This is a new problem. The problem occurs constantly. The problem has been gradually worsening. Nothing relieves the symptoms. He has tried nothing for the symptoms. The treatment provided no relief.  Pt is worried about his blood pressure.  Pt reports blood pressure was high at home after going outside for 10 minutes.  Pt has taken both of his medications.   Past Medical History:  Diagnosis Date  . Alcohol abuse   . Anxiety   . Chronic neck pain   . Cocaine abuse   . Hypertension     Patient Active Problem List   Diagnosis Date Noted  . 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 Dizziness 01/23/2017  . Back pain 11/27/2016  . Bilateral posterior neck pain 11/27/2016  . Epigastric abdominal pain 11/21/2016  . T wave inversion in EKG 11/21/2016  . Nausea 11/21/2016  . Anxiety 11/21/2016    Past Surgical History:  Procedure Laterality Date  . EYE SURGERY Right ~ 1980   "hit my eye"       Home Medications    Prior to Admission medications   Medication Sig Start Date End Date Taking? Authorizing Provider  amLODipine (NORVASC) 5 MG tablet Take 1 tablet (5 mg total) by mouth daily. 02/21/17   Anders Simmonds, PA-C  clarithromycin (BIAXIN) 500 MG tablet Take 1 tablet (500 mg total) by mouth 2 (two) times daily. Patient not taking: Reported on 02/21/2017 02/12/17   Marcine Matar, MD  diazepam (VALIUM) 5 MG tablet Take 5 mg by mouth daily.    [provider]  escitalopram (LEXAPRO) 10 MG tablet Take 1 tablet (10 mg total) by mouth daily. 02/09/17   Marcine Matar, MD  famotidine (PEPCID) 20 MG tablet Take 1  tablet (20 mg total) by mouth 2 (two) times daily. Patient not taking: Reported on 02/04/2017 01/31/17   Lorre Nick, MD  hydrOXYzine (ATARAX/VISTARIL) 25 MG tablet Take 1 tablet (25 mg total) by mouth 2 (two) times daily as needed for anxiety. 02/21/17   Anders Simmonds, PA-C  meclizine (ANTIVERT) 25 MG tablet Take 1 tablet (25 mg total) by mouth 3 (three) times daily as needed for dizziness. 02/20/17   Deatra Canter, FNP  metoCLOPramide (REGLAN) 10 MG tablet Take 1 tablet (10 mg total) by mouth every 8 (eight) hours as needed for nausea. 01/14/17 02/08/17  Michela Pitcher A, PA-C  omeprazole (PRILOSEC) 20 MG capsule Take 1 capsule (20 mg total) by mouth daily. 02/03/17   Mackuen, Courteney Lyn, MD  omeprazole (PRILOSEC) 20 MG capsule Take 1 capsule (20 mg total) by mouth 2 (two) times daily before a meal. 02/12/17   Marcine Matar, MD  Vitamin D, Ergocalciferol, (DRISDOL) 50000 units CAPS capsule Take 1 capsule (50,000 Units total) by mouth every 7 (seven) days. 02/09/17   Anders Simmonds, PA-C    Family History Family History  Problem Relation Age of Onset  . Hypertension Mother   . Hypertension Father     Social History Social History  Substance Use Topics  . Smoking status: Never Smoker  .  Smokeless tobacco: Never Used  . Alcohol use No     Comment: 11/21/2016 "last drank 06/2016"; denies      Allergies   Motrin [ibuprofen] and Zofran [ondansetron hcl]   Review of Systems Review of Systems  All other systems reviewed and are negative.    Physical Exam Updated Vital Signs BP (!) 141/104 (BP Location: Right Arm)   Pulse (!) 112   Temp 98.5 F (36.9 C) (Oral)   Resp 18   Ht 5\' 6"  (1.676 m)   Wt 83.9 kg (185 lb)   SpO2 100%   BMI 29.86 kg/m   Physical Exam  Constitutional: He appears well-developed.  HENT:  Head: Normocephalic.  Eyes: Pupils are equal, round, and reactive to light.  Neck: Normal range of motion.  Cardiovascular: Normal rate and regular rhythm.     Pulmonary/Chest: Effort normal and breath sounds normal.  Abdominal: Soft.  Musculoskeletal: Normal range of motion.  Neurological: He is alert.  Skin: Skin is warm.  Nursing note and vitals reviewed.    ED Treatments / Results  Labs (all labs ordered are listed, but only abnormal results are displayed) Labs Reviewed - No data to display  EKG  EKG Interpretation None       Radiology No results found.  Procedures Procedures (including critical care time)  Medications Ordered in ED Medications - No data to display   Initial Impression / Assessment and Plan / ED Course  I have reviewed the triage vital signs and the nursing notes.  Pertinent labs & imaging results that were available during my care of the patient were reviewed by me and considered in my medical decision making (see chart for details).    Pt's blood pressure minimally elevated.  Pt advised to have his MD recheck his blood pressure.   Pt reports being seem multiple times for the same.  I will add hctz 12.5    Final Clinical Impressions(s) / ED Diagnoses   Final diagnoses:  Hypertension, unspecified type    New Prescriptions New Prescriptions   HYDROCHLOROTHIAZIDE (HYDRODIURIL) 12.5 MG TABLET    Take 1 tablet (12.5 mg total) by mouth daily.  An After Visit Summary was printed and given to the patient.   Elson AreasSofia, Leslie K, PA-C 02/25/17 1617    Elson AreasSofia, Leslie K, PA-C 02/25/17 1618    Margarita Grizzleay, Danielle, MD 02/25/17 [phone]

## 2017-02-25 NOTE — ED Triage Notes (Signed)
Pt to ER for persistent hypertension per patient. States compliance with medications.

## 2017-02-25 NOTE — Discharge Instructions (Signed)
See your Physician for recheck.  °

## 2017-02-26 ENCOUNTER — Telehealth: Payer: Self-pay | Admitting: Internal Medicine

## 2017-02-26 NOTE — Telephone Encounter (Signed)
Called placed. Spoke with patient (in Spanish) to check on how he was doing especially after his visit to the ED yesterday, pt stated that he was "ok". Asked patient if he had taken the medication that was prescribed yesterday (hydrochlorothiazide), patient stated that he had picked it up but had not taken it yet. Because of his frequent visits to the ED, I asked patient if there was any other way that we can help him. Pt just stated that he was concerned about his bp and how it gives him anxiety. I informed patient that he can call the office and speak with nurse when he doesn't feel good. Also informed patient of our same day appt availability, but he has to call the office. Informed patient that this is another option rather than him going to the ED. Pt understood.   Additionally, I told patient of how [facility] it was for [facility]. Pt stated that he didn't know what [facility] what it was. Gave patient their number as well as informed him what the clinic does. Pt understood and said that he will be contacting them.   Confirmed with patient that transportation is not an issue and that his preferred language is [place].

## 2017-02-27 ENCOUNTER — Encounter (HOSPITAL_COMMUNITY): Payer: Self-pay | Admitting: Emergency Medicine

## 2017-02-27 ENCOUNTER — Emergency Department (HOSPITAL_COMMUNITY)
Admission: EM | Admit: 2017-02-27 | Discharge: 2017-02-27 | Disposition: A | Discharge status: Home / Self Care | Payer: Self-pay | Attending: Emergency Medicine | Admitting: Emergency Medicine

## 2017-02-27 ENCOUNTER — Ambulatory Visit (HOSPITAL_BASED_OUTPATIENT_CLINIC_OR_DEPARTMENT_OTHER): Payer: Self-pay | Admitting: Pharmacist

## 2017-02-27 ENCOUNTER — Telehealth: Payer: Self-pay | Admitting: Internal Medicine

## 2017-02-27 ENCOUNTER — Telehealth: Payer: Self-pay

## 2017-02-27 ENCOUNTER — Emergency Department (HOSPITAL_COMMUNITY): Payer: Self-pay

## 2017-02-27 VITALS — BP 120/86

## 2017-02-27 DIAGNOSIS — I10 Essential (primary) hypertension: Secondary | ICD-10-CM

## 2017-02-27 DIAGNOSIS — R4781 Slurred speech: Secondary | ICD-10-CM | POA: Insufficient documentation

## 2017-02-27 DIAGNOSIS — Z5321 Procedure and treatment not carried out due to patient leaving prior to being seen by health care provider: Secondary | ICD-10-CM | POA: Insufficient documentation

## 2017-02-27 DIAGNOSIS — F419 Anxiety disorder, unspecified: Secondary | ICD-10-CM | POA: Insufficient documentation

## 2017-02-27 LAB — CBC
HCT: 44 % (ref 39.0–52.0)
Hemoglobin: 14.6 g/dL (ref 13.0–17.0)
MCH: 27.3 pg (ref 26.0–34.0)
MCHC: 33.2 g/dL (ref 30.0–36.0)
MCV: 82.2 fL (ref 78.0–100.0)
Platelets: 256 10*3/uL (ref 150–400)
RBC: 5.35 MIL/uL (ref 4.22–5.81)
RDW: 13 % (ref 11.5–15.5)
WBC: 8.7 10*3/uL (ref 4.0–10.5)

## 2017-02-27 LAB — COMPREHENSIVE METABOLIC PANEL
ALT: 22 U/L (ref 17–63)
AST: 24 U/L (ref 15–41)
Albumin: 4.5 g/dL (ref 3.5–5.0)
Alkaline Phosphatase: 75 U/L (ref 38–126)
Anion gap: 9 (ref 5–15)
BUN: 9 mg/dL (ref 6–20)
CO2: 28 mmol/L (ref 22–32)
Calcium: 9.6 mg/dL (ref 8.9–10.3)
Chloride: 97 mmol/L — ABNORMAL LOW (ref 101–111)
Creatinine, Ser: 0.78 mg/dL (ref 0.61–1.24)
GFR calc Af Amer: >60 mL/min (ref >60)
GFR calc non Af Amer: >60 mL/min (ref >60)
Glucose, Bld: 110 mg/dL — ABNORMAL HIGH (ref 65–99)
Potassium: 3.4 mmol/L — ABNORMAL LOW (ref 3.5–5.1)
Sodium: 134 mmol/L — ABNORMAL LOW (ref 135–145)
Total Bilirubin: 0.5 mg/dL (ref 0.3–1.2)
Total Protein: 8.3 g/dL — ABNORMAL HIGH (ref 6.5–8.1)

## 2017-02-27 LAB — I-STAT TROPONIN, ED: Troponin i, poc: 0 ng/mL (ref 0.00–0.08)

## 2017-02-27 LAB — I-STAT CHEM 8, ED
BUN: 11 mg/dL (ref 6–20)
Calcium, Ion: 1.16 mmol/L (ref 1.15–1.40)
Chloride: 97 mmol/L — ABNORMAL LOW (ref 101–111)
Creatinine, Ser: 0.7 mg/dL (ref 0.61–1.24)
Glucose, Bld: 111 mg/dL — ABNORMAL HIGH (ref 65–99)
HCT: 45 % (ref 39.0–52.0)
Hemoglobin: 15.3 g/dL (ref 13.0–17.0)
Potassium: 3.5 mmol/L (ref 3.5–5.1)
Sodium: 136 mmol/L (ref 135–145)
TCO2: 28 mmol/L (ref 0–100)

## 2017-02-27 LAB — DIFFERENTIAL
Basophils Absolute: 0 10*3/uL (ref 0.0–0.1)
Basophils Relative: 0 %
Eosinophils Absolute: 0.3 10*3/uL (ref 0.0–0.7)
Eosinophils Relative: 4 %
Lymphocytes Relative: 41 %
Lymphs Abs: 3.6 10*3/uL (ref 0.7–4.0)
Monocytes Absolute: 0.7 10*3/uL (ref 0.1–1.0)
Monocytes Relative: 8 %
Neutro Abs: 4 10*3/uL (ref 1.7–7.7)
Neutrophils Relative %: 47 %

## 2017-02-27 LAB — APTT: aPTT: 30 seconds (ref 24–36)

## 2017-02-27 LAB — PROTIME-INR
INR: 1.05
Prothrombin Time: 13.7 seconds (ref 11.4–15.2)

## 2017-02-27 NOTE — Patient Instructions (Signed)
Thanks for coming to see us!  Follow up with Dr. Johnson as directed 

## 2017-02-27 NOTE — Telephone Encounter (Signed)
Call placed to Wesley Mandril, RN CM to inform her that the patient is currently in the ED. He was at [facility] this morning to have his BP checked and also  met with Wesley See, LCSW regarding his frequent ED visits.  He explained that he is having difficult managing his anxiety and has been encouraged to go to [facility]. He was again given the address/contact information [facility].  He has multiple visits to ED at [facility], [facility] , [facility] and [facility]

## 2017-02-27 NOTE — ED Notes (Signed)
Spoke with PA Danise, no code stroke at this time, but do labs and CT.

## 2017-02-27 NOTE — Discharge Planning (Signed)
[facility].  Pt was encouraged patient to [facility]. Also, advised him to call the office when he doesn't feel well. Pt understood and stated that he [facility]

## 2017-02-27 NOTE — Care Management (Signed)
Patient has had 48 ED visits in the past 6 months. [facility]. Clinic CM and CSW have been closely following patient to assist address  barriers to care.  CM was awaiting patient to be placed in room, but patient left without being seen. ED CM will update Clinic CM

## 2017-02-27 NOTE — Telephone Encounter (Signed)
LCSW and myself met with patient to discuss his frequently visits to the ED. Encouraged patient to go to [facility] to get help with his anxiety. Also, advised him to call the office when he doesn't feel well. Pt understood and stated that he has an appt next week Thursday at [facility].  ° °Reminded patient of his appt with PCP this Friday.  °

## 2017-02-27 NOTE — Progress Notes (Signed)
   S:    Patient arrives in good spirits. Presents to the clinic for hypertension evaluation as a walk in. He brings his medications with him to clinic today.   Patient reports adherence with medications. He takes all of his blood pressure medications at 1pm daily.   Current BP Medications include:  Amlodipine 5 mg daily, HCTZ 12.5 mg daily. He has the lisinopril-HCTZ with him but reports that he is not taking it.   Patient was recently discharged from hospital and all medications have been reviewed.  O:   Last 3 Office BP readings: BP Readings from Last 3 Encounters:  02/27/17 120/86  02/25/17 (!) 135/98  02/23/17 124/82    BMET    Component Value Date/Time   NA 138 02/18/2017 1305   K 3.5 02/18/2017 1305   CL 99 (L) 02/18/2017 1305   CO2 27 02/14/2017 2131   GLUCOSE 81 02/18/2017 1305   BUN 5 (L) 02/18/2017 1305   CREATININE 0.70 02/18/2017 1305   CALCIUM 9.0 02/14/2017 2131   GFRNONAA >60 02/14/2017 2131   GFRAA >60 02/14/2017 2131    A/P: Blood pressure currently controlled on current medications.  Continued all medications as prescribed and patient will follow up with Dr. Laural BenesJohnson. Of note, patient has lisinopril-HCTZ 10-12.5 mg but he is not taking it. Instructed not to take it. Continue amlodipine and HCTZ.  Results reviewed and written information provided.   Total time in face-to-face counseling 15 minutes.   F/U Clinic Visit with Dr. Rossie MuskratJonhson.  Patient seen with Dutch QuintKaley Whitesell, PharmD Candidate and Louis MatteJay Worthington, PharmD Candidate

## 2017-02-27 NOTE — ED Triage Notes (Signed)
Pt reports feeling anxious after taking amlodipine.  Pt reports anxiety within one hour of medicating, states feels anxious "on the back of my neck." Pt denies any pain at this time.

## 2017-02-27 NOTE — ED Triage Notes (Signed)
Patient with HTN, has taken his medications as prescribed, now with sudden onset of dizziness and nausea.  Patient states that it all started 2 hours ago, patient with equal hand grips, equal pedal pushes and strong, no facial droop or slurred speech.

## 2017-02-28 ENCOUNTER — Emergency Department (HOSPITAL_COMMUNITY)
Admission: EM | Admit: 2017-02-28 | Discharge: 2017-02-28 | Disposition: A | Discharge status: Home / Self Care | Payer: Self-pay | Attending: Emergency Medicine | Admitting: Emergency Medicine

## 2017-03-01 ENCOUNTER — Emergency Department (HOSPITAL_COMMUNITY)
Admission: EM | Admit: 2017-03-01 | Discharge: 2017-03-01 | Disposition: A | Discharge status: Home / Self Care | Payer: Self-pay | Attending: Emergency Medicine | Admitting: Emergency Medicine

## 2017-03-01 DIAGNOSIS — M62838 Other muscle spasm: Secondary | ICD-10-CM | POA: Insufficient documentation

## 2017-03-01 DIAGNOSIS — M542 Cervicalgia: Secondary | ICD-10-CM | POA: Insufficient documentation

## 2017-03-01 DIAGNOSIS — Z79899 Other long term (current) drug therapy: Secondary | ICD-10-CM | POA: Insufficient documentation

## 2017-03-01 DIAGNOSIS — I10 Essential (primary) hypertension: Secondary | ICD-10-CM | POA: Insufficient documentation

## 2017-03-01 MED ORDER — CYCLOBENZAPRINE HCL 10 MG PO TABS: 10.0000 mg | ORAL_TABLET | Freq: Every day | ORAL | 0 refills | Status: AC

## 2017-03-01 MED ORDER — LIDOCAINE 5 % EX PTCH: 1.0000 | MEDICATED_PATCH | CUTANEOUS | 0 refills | Status: AC

## 2017-03-01 MED ORDER — LIDOCAINE 5 % EX PTCH
1.0000 | MEDICATED_PATCH | CUTANEOUS | Status: DC
  Administered 2017-03-01: 1 via TRANSDERMAL
  Filled 2017-03-01: qty 1

## 2017-03-01 MED ORDER — CYCLOBENZAPRINE HCL 5 MG PO TABS
7.5000 mg | ORAL_TABLET | Freq: Once | ORAL | Status: AC
  Administered 2017-03-01: 7.5 mg via ORAL
  Filled 2017-03-01: qty 1.5
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
## 2017-03-01 NOTE — Discharge Instructions (Signed)
Por favor tome flexeriil antes de dormir. Esta medicine ayuda con espasmos musculares.  Esta pastilla le puede dar somnolencia asi que no maneje o beba alcohol.   Aplica lidocaine patch en la parte superior de su cuello.   Por favor llame a su doctora general para hacer una cita en 1 semana para una reevaluacion

## 2017-03-01 NOTE — ED Triage Notes (Signed)
Pt sts he was sleeping and his neck started moving up and down. Stating he wasn't able to sleep. Denies any pain. C/o anxiety. A&O x4.

## 2017-03-02 ENCOUNTER — Encounter (HOSPITAL_COMMUNITY): Payer: Self-pay | Admitting: *Deleted

## 2017-03-02 ENCOUNTER — Other Ambulatory Visit: Payer: Self-pay

## 2017-03-02 ENCOUNTER — Emergency Department (HOSPITAL_COMMUNITY): Payer: Self-pay

## 2017-03-02 ENCOUNTER — Emergency Department (HOSPITAL_COMMUNITY)
Admission: EM | Admit: 2017-03-02 | Discharge: 2017-03-02 | Disposition: A | Discharge status: Home / Self Care | Payer: Self-pay | Attending: Emergency Medicine | Admitting: Emergency Medicine

## 2017-03-02 ENCOUNTER — Encounter: Payer: Self-pay | Admitting: Internal Medicine

## 2017-03-02 ENCOUNTER — Ambulatory Visit: Payer: Self-pay | Attending: Internal Medicine | Admitting: Internal Medicine

## 2017-03-02 VITALS — BP 137/92 | HR 80 | Temp 98.2°F | Resp 16 | Wt 186.2 lb

## 2017-03-02 DIAGNOSIS — R42 Dizziness and giddiness: Secondary | ICD-10-CM | POA: Insufficient documentation

## 2017-03-02 DIAGNOSIS — Y829 Unspecified medical devices associated with adverse incidents: Secondary | ICD-10-CM | POA: Insufficient documentation

## 2017-03-02 DIAGNOSIS — T380X5A Adverse effect of glucocorticoids and synthetic analogues, initial encounter: Secondary | ICD-10-CM | POA: Insufficient documentation

## 2017-03-02 DIAGNOSIS — F419 Anxiety disorder, unspecified: Secondary | ICD-10-CM | POA: Insufficient documentation

## 2017-03-02 DIAGNOSIS — I10 Essential (primary) hypertension: Secondary | ICD-10-CM | POA: Insufficient documentation

## 2017-03-02 DIAGNOSIS — R000 Tachycardia, unspecified: Secondary | ICD-10-CM | POA: Insufficient documentation

## 2017-03-02 DIAGNOSIS — Z79899 Other long term (current) drug therapy: Secondary | ICD-10-CM | POA: Insufficient documentation

## 2017-03-02 DIAGNOSIS — T50905A Adverse effect of unspecified drugs, medicaments and biological substances, initial encounter: Secondary | ICD-10-CM

## 2017-03-02 DIAGNOSIS — R1013 Epigastric pain: Secondary | ICD-10-CM | POA: Insufficient documentation

## 2017-03-02 DIAGNOSIS — M542 Cervicalgia: Secondary | ICD-10-CM | POA: Insufficient documentation

## 2017-03-02 DIAGNOSIS — R0602 Shortness of breath: Secondary | ICD-10-CM | POA: Insufficient documentation

## 2017-03-02 DIAGNOSIS — F411 Generalized anxiety disorder: Secondary | ICD-10-CM

## 2017-03-02 DIAGNOSIS — T887XXA Unspecified adverse effect of drug or medicament, initial encounter: Secondary | ICD-10-CM | POA: Insufficient documentation

## 2017-03-02 LAB — BASIC METABOLIC PANEL
Anion gap: 12 (ref 5–15)
BUN: 9 mg/dL (ref 6–20)
CO2: 19 mmol/L — ABNORMAL LOW (ref 22–32)
Calcium: 9.4 mg/dL (ref 8.9–10.3)
Chloride: 102 mmol/L (ref 101–111)
Creatinine, Ser: 1.01 mg/dL (ref 0.61–1.24)
GFR calc Af Amer: >60 mL/min (ref >60)
GFR calc non Af Amer: >60 mL/min (ref >60)
Glucose, Bld: 216 mg/dL — ABNORMAL HIGH (ref 65–99)
Potassium: 3.7 mmol/L (ref 3.5–5.1)
Sodium: 133 mmol/L — ABNORMAL LOW (ref 135–145)

## 2017-03-02 LAB — I-STAT TROPONIN, ED
Troponin i, poc: 0 ng/mL (ref 0.00–0.08)
Troponin i, poc: 0 ng/mL (ref 0.00–0.08)

## 2017-03-02 LAB — CBC
HCT: 42.4 % (ref 39.0–52.0)
Hemoglobin: 14.2 g/dL (ref 13.0–17.0)
MCH: 27.8 pg (ref 26.0–34.0)
MCHC: 33.5 g/dL (ref 30.0–36.0)
MCV: 83 fL (ref 78.0–100.0)
Platelets: 280 10*3/uL (ref 150–400)
RBC: 5.11 MIL/uL (ref 4.22–5.81)
RDW: 13.1 % (ref 11.5–15.5)
WBC: 7.2 10*3/uL (ref 4.0–10.5)

## 2017-03-02 LAB — RAPID URINE DRUG SCREEN, HOSP PERFORMED
Amphetamines: NOT DETECTED
Barbiturates: NOT DETECTED
Benzodiazepines: NOT DETECTED
Cocaine: NOT DETECTED
Opiates: NOT DETECTED
Tetrahydrocannabinol: NOT DETECTED

## 2017-03-02 LAB — D-DIMER, QUANTITATIVE: D-Dimer, Quant: 0.43 ug/mL-FEU (ref 0.00–0.50)

## 2017-03-02 LAB — TSH: TSH: 0.47 u[IU]/mL (ref 0.350–4.500)

## 2017-03-02 MED ORDER — LORAZEPAM 2 MG/ML IJ SOLN
1.0000 mg | Freq: Once | INTRAMUSCULAR | Status: AC
  Administered 2017-03-02: 1 mg via INTRAVENOUS
  Filled 2017-03-02: qty 1

## 2017-03-02 MED ORDER — SODIUM CHLORIDE 0.9 % IV BOLUS (SEPSIS)
1000.0000 mL | Freq: Once | INTRAVENOUS | Status: AC
  Administered 2017-03-02: 1000 mL via INTRAVENOUS

## 2017-03-02 MED ORDER — IOPAMIDOL (ISOVUE-370) INJECTION 76%
100.0000 mL | Freq: Once | INTRAVENOUS | Status: AC | PRN
  Administered 2017-03-02: 100 mL via INTRAVENOUS

## 2017-03-02 MED ORDER — IOPAMIDOL (ISOVUE-370) INJECTION 76%
INTRAVENOUS | Status: AC
  Filled 2017-03-02: qty 100

## 2017-03-02 NOTE — Progress Notes (Signed)
Pt states he is not in the any pain

## 2017-03-02 NOTE — ED Provider Notes (Signed)
[facility] Provider Note   CSN: [id number] Arrival date & time: 03/02/17  1630     History   Chief Complaint Chief Complaint  Patient presents with  . Chest Pain  . Shortness of Breath    HPI Wesley [name] is a 51 y.o. male.  HPI   Pt with hx chronic neck pain, alcohol and cocaine abuse, HTN, anxiety p/w tingling in his arms, dizziness, SOB,  And tingling throughout his chest and arms that began about 2 hours after he was given an IM steroid injection at a new doctor's office.  States he was being seen for upper back muscle spasm/chronic neck pain and the injection was in the left lateral shoulder. The symptoms have been constant since about 1pm.  Denies sharp chest pain or pressure, no cough.    Past Medical History:  Diagnosis Date  . Alcohol abuse   . Anxiety   . Chronic neck pain   . Cocaine abuse   . Hypertension     Patient Active Problem List   Diagnosis Date Noted  . 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 Dizziness 01/23/2017  . Back pain 11/27/2016  . Bilateral posterior neck pain 11/27/2016  . Epigastric abdominal pain 11/21/2016  . T wave inversion in EKG 11/21/2016  . Nausea 11/21/2016  . Anxiety 11/21/2016    Past Surgical History:  Procedure Laterality Date  . EYE SURGERY Right ~ 1980   "hit my eye"       Home Medications    Prior to Admission medications   Medication Sig Start Date End Date Taking? Authorizing Provider  amLODipine (NORVASC) 5 MG tablet Take 1 tablet (5 mg total) by mouth daily. 02/21/17  Yes Georgian CoMcClung, Angela M, PA-C  hydrochlorothiazide (HYDRODIURIL) 12.5 MG tablet Take 1 tablet (12.5 mg total) by mouth daily. 02/25/17  Yes Cheron SchaumannSofia, Leslie K, PA-C  hydrOXYzine (ATARAX/VISTARIL) 25 MG tablet Take 1 tablet (25 mg total) by mouth 2 (two) times daily as needed for anxiety. 02/21/17  Yes Anders SimmondsMcClung, Angela M, PA-C  lidocaine (LIDODERM) 5 % Place 1 patch onto the skin daily. Remove & Discard patch within 12 hours or as directed by MD 03/01/17  03/06/17 Yes Liberty HandyGibbons, Claudia J, PA-C  clarithromycin (BIAXIN) 500 MG tablet Take 1 tablet (500 mg total) by mouth 2 (two) times daily. Patient not taking: Reported on 02/21/2017 02/12/17   Marcine MatarJohnson, Deborah B, MD  cyclobenzaprine (FLEXERIL) 10 MG tablet Take 1 tablet (10 mg total) by mouth at bedtime. 03/01/17 03/06/17  Liberty HandyGibbons, Claudia J, PA-C  escitalopram (LEXAPRO) 10 MG tablet Take 1 tablet (10 mg total) by mouth daily. Patient not taking: Reported on 03/02/2017 02/09/17   Marcine MatarJohnson, Deborah B, MD  famotidine (PEPCID) 20 MG tablet Take 1 tablet (20 mg total) by mouth 2 (two) times daily. Patient not taking: Reported on 03/02/2017 01/31/17   Lorre NickAllen, Anthony, MD  meclizine (ANTIVERT) 25 MG tablet Take 1 tablet (25 mg total) by mouth 3 (three) times daily as needed for dizziness. Patient not taking: Reported on 03/02/2017 02/20/17   Deatra Canterxford, William J, FNP  metoCLOPramide (REGLAN) 10 MG tablet Take 1 tablet (10 mg total) by mouth every 8 (eight) hours as needed for nausea. Patient not taking: Reported on 03/02/2017 01/14/17 03/02/17  Michela PitcherFawze, Mina A, PA-C  omeprazole (PRILOSEC) 20 MG capsule Take 1 capsule (20 mg total) by mouth daily. Patient not taking: Reported on 03/02/2017 02/03/17   Mackuen, Cindee Saltourteney Lyn, MD  omeprazole (PRILOSEC) 20 MG capsule Take 1 capsule (20 mg total)  by mouth 2 (two) times daily before a meal. Patient not taking: Reported on 03/02/2017 02/12/17   Marcine Matar, MD  Vitamin D, Ergocalciferol, (DRISDOL) 50000 units CAPS capsule Take 1 capsule (50,000 Units total) by mouth every 7 (seven) days. Patient not taking: Reported on 03/02/2017 02/09/17   Anders Simmonds, PA-C    Family History Family History  Problem Relation Age of Onset  . Hypertension Mother   . Hypertension Father     Social History Social History  Substance Use Topics  . Smoking status: Never Smoker  . Smokeless tobacco: Never Used  . Alcohol use No     Comment: 11/21/2016 "last drank 06/2016"; denies       Allergies   Motrin [ibuprofen]; Other; and Zofran [ondansetron hcl]   Review of Systems Review of Systems  All other systems reviewed and are negative.    Physical Exam Updated Vital Signs BP 121/86   Pulse (!) 105   Temp 98.6 F (37 C) (Oral)   Resp 20   SpO2 97%   Physical Exam  Constitutional: He appears well-developed and well-nourished. No distress.  HENT:  Head: Normocephalic and atraumatic.  Neck: Neck supple.  Cardiovascular: Regular rhythm.  Tachycardia present.   Pulmonary/Chest: Effort normal and breath sounds normal. No respiratory distress. He has no wheezes. He has no rales.  Abdominal: Soft. He exhibits no distension and no mass. There is no tenderness. There is no rebound and no guarding.  Musculoskeletal: He exhibits no edema.  Neurological: He is alert. He exhibits normal muscle tone.  Skin: He is not diaphoretic.  Nursing note and vitals reviewed.    ED Treatments / Results  Labs (all labs ordered are listed, but only abnormal results are displayed) Labs Reviewed  BASIC METABOLIC PANEL - Abnormal; Notable for the following:       Result Value   Sodium 133 (*)    CO2 19 (*)    Glucose, Bld 216 (*)    All other components within normal limits  CBC  D-DIMER, QUANTITATIVE (NOT AT [facility])  RAPID URINE DRUG SCREEN, HOSP PERFORMED  I-STAT TROPOININ, ED  I-STAT TROPOININ, ED    EKG  EKG Interpretation None       Radiology Dg Chest 2 View  Result Date: 03/02/2017 CLINICAL DATA:  Patient states he received a steroid shot today in his left shoulder, shortly after he started to tremble uncontrollably and had chest pain. His chest pain is beginning to subside. Hx of hypertension and started medication approx 20 days ago. EXAM: CHEST  2 VIEW COMPARISON:  02/14/2017 FINDINGS: Normal cardiac silhouette with mildly ectatic aorta. There is mild central venous congestion similar prior. No focal infiltrate. No pneumothorax. IMPRESSION: Mild central  venous congestion Electronically Signed   By: Genevive Bi M.D.   On: 03/02/2017 18:14    Procedures Procedures (including critical care time)  Medications Ordered in ED Medications  sodium chloride 0.9 % bolus 1,000 mL (0 mLs Intravenous Stopped 03/02/17 1903)  LORazepam (ATIVAN) injection 1 mg (1 mg Intravenous Given 03/02/17 1752)  sodium chloride 0.9 % bolus 1,000 mL (1,000 mLs Intravenous New Bag/Given 03/02/17 1956)     Initial Impression / Assessment and Plan / ED Course  I have reviewed the triage vital signs and the nursing notes.  Pertinent labs & imaging results that were available during my care of the patient were reviewed by me and considered in my medical decision making (see chart for details).  Clinical Course as  of Mar 03 2015  Fri Mar 02, 2017  1723 ED EKG within 10 minutes [EW]    Clinical Course User Index [EW] Trixie Dredge, [place]    Afebrile nontoxic but tachycardic patient with symptoms of feeling jittery and shaking all over with SOB, beginning approximately 2 hours after IM steroid injection.  Pt denies ever having steroids before.  Workup reassuring.  Pending second troponin, urine drug screen at change of shift.  Heart rate and symptoms improving with IVF and ativan.  Anticipate d/c home with PCP follow up.  Signed out to [employer], PA-C, at change of shift.    Final Clinical Impressions(s) / ED Diagnoses   Final diagnoses:  None    New Prescriptions New Prescriptions   No medications on file     Trixie Dredge, Cordelia Poche 03/02/17 2016    Loren Racer, MD 03/09/17 1558

## 2017-03-02 NOTE — ED Triage Notes (Signed)
To ED via pov for eval of feeling sob and cp. Started 30 min pta. States he was at md this morning and had cortisone injection in left shoulder but not feeling better. Pt diaphoretic. Very anxious in triage.

## 2017-03-02 NOTE — ED Notes (Signed)
Pt states that "i feel much better now" Pt does not appear to be as anxious. Still tachy at 110.

## 2017-03-02 NOTE — ED Notes (Signed)
Pt returned from CT °

## 2017-03-02 NOTE — ED Notes (Signed)
Pt transported to CT ?

## 2017-03-02 NOTE — ED Notes (Signed)
After 2L of fluid pt still tachy at 122 bpm.

## 2017-03-02 NOTE — Progress Notes (Signed)
Patient ID: Wesley [name], male    DOB: 04/13/1966  MRN: [id number]  CC: Follow-up   Subjective: Wesley [name] is a 51 y.o. male who presents for follow up. Patient has a male friend with him His concerns today include:  Patient continues to be seen in the emergency room frequently with complaints of anxiety, dizziness related to anxiety, concerns about his blood pressure and yesterday for neck pain.  1. Anxiety: He has bottle of hydroxyzine with him today. States that he takes it twice a day and it helps some with the anxiety. -Does not have Lexapro with him. Initially he was unsure whether he had filled it. However he then said he has it at home but has not been taking it. He does not give any specific reasons as to why. -Reportedly has an appointment next week with a mental health provider. He is not sure the name of the L denies a patient.  2. HTN: He saw our PA last week. She had him discontinue HCTZ since he felt that the medicine was too strong. Started on Norvasc. Saw our clinical pharmacists who advised HCTZ plus Norvasc. Patient has bottle of HCTZ with him today. He states that the ER physician told him that this medicine is better for him than the Norvasc. He denies any headaches or dizziness at this time.-  Patient Active Problem List   Diagnosis Date Noted  . 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 Dizziness 01/23/2017  . Back pain 11/27/2016  . Bilateral posterior neck pain 11/27/2016  . Epigastric abdominal pain 11/21/2016  . T wave inversion in EKG 11/21/2016  . Nausea 11/21/2016  . Anxiety 11/21/2016     Current Outpatient Prescriptions on File Prior to Visit  Medication Sig Dispense Refill  . amLODipine (NORVASC) 5 MG tablet Take 1 tablet (5 mg total) by mouth daily. 90 tablet 3  . clarithromycin (BIAXIN) 500 MG tablet Take 1 tablet (500 mg total) by mouth 2 (two) times daily. (Patient not taking: Reported on 02/21/2017) 28 tablet 0  . cyclobenzaprine (FLEXERIL) 10 MG tablet  Take 1 tablet (10 mg total) by mouth at bedtime. 5 tablet 0  . diazepam (VALIUM) 5 MG tablet Take 5 mg by mouth daily.    Marland Kitchen escitalopram (LEXAPRO) 10 MG tablet Take 1 tablet (10 mg total) by mouth daily. 30 tablet 3  . famotidine (PEPCID) 20 MG tablet Take 1 tablet (20 mg total) by mouth 2 (two) times daily. (Patient not taking: Reported on 02/04/2017) 30 tablet 0  . hydrochlorothiazide (HYDRODIURIL) 12.5 MG tablet Take 1 tablet (12.5 mg total) by mouth daily. (Patient not taking: Reported on 02/27/2017) 30 tablet 1  . hydrOXYzine (ATARAX/VISTARIL) 25 MG tablet Take 1 tablet (25 mg total) by mouth 2 (two) times daily as needed for anxiety. 60 tablet 1  . lidocaine (LIDODERM) 5 % Place 1 patch onto the skin daily. Remove & Discard patch within 12 hours or as directed by MD 5 patch 0  . meclizine (ANTIVERT) 25 MG tablet Take 1 tablet (25 mg total) by mouth 3 (three) times daily as needed for dizziness. 30 tablet 0  . metoCLOPramide (REGLAN) 10 MG tablet Take 1 tablet (10 mg total) by mouth every 8 (eight) hours as needed for nausea. 15 tablet 0  . omeprazole (PRILOSEC) 20 MG capsule Take 1 capsule (20 mg total) by mouth daily. 30 capsule 0  . omeprazole (PRILOSEC) 20 MG capsule Take 1 capsule (20 mg total) by mouth 2 (two) times daily  before a meal. 28 capsule 0  . Vitamin D, Ergocalciferol, (DRISDOL) 50000 units CAPS capsule Take 1 capsule (50,000 Units total) by mouth every 7 (seven) days. 16 capsule 0   No current facility-administered medications on file prior to visit.     Allergies  Allergen Reactions  . Motrin [Ibuprofen] Other (See Comments)    Makes his throat get "very dry" after taking it  . Zofran [Ondansetron Hcl] Other (See Comments)    Sweating and body feels bad    Social History   Social History  . Marital status: Significant Other    Spouse name: N/A  . Number of children: N/A  . Years of education: N/A   Occupational History  . Not on file.   Social History Main  Topics  . Smoking status: Never Smoker  . Smokeless tobacco: Never Used  . Alcohol use No     Comment: 11/21/2016 "last drank 06/2016"; denies   . Drug use: Yes     Comment: denies, reports past history   . Sexual activity: Not on file   Other Topics Concern  . Not on file   Social History Narrative  . No narrative on file    Family History  Problem Relation Age of Onset  . Hypertension Mother   . Hypertension Father     Past Surgical History:  Procedure Laterality Date  . EYE SURGERY Right ~ 1980   "hit my eye"    ROS: Review of Systems As stated above.  PHYSICAL EXAM: BP (!) 137/92   Pulse 80   Temp 98.2 F (36.8 C) (Oral)   Resp 16   Wt 186 lb 3.2 oz (84.5 kg)   SpO2 96%   BMI 30.05 kg/m   BP 124/85 RT side.  Physical Exam  General appearance - patient in NAD Mental status - patient is alert and oriented. He answers questions appropriately. Neck -neck is supple. No thyroid enlargement. Chest -chest clear bilaterally. Heart -regular rate and rhythm no gallops or murmurs. Extremities - no lower extremity edema.   ASSESSMENT AND PLAN: 1. Anxiety disorder, unspecified type -Advised patient to take the Lexapro every day as previously prescribed. He insists that he wants to get better but so far has not been compliant with taking both the Lexapro and hydroxyzine. Or with seeing a mental health provider.  encouraged him to keep the appointment that he has next wk with mental health agency -If frequent ER visits continue, ER physician may consider involuntary commitment to get patient stabilized on antianxiety medication  2. Essential hypertension -Patient advised to take the amlodipine plus the HCTZ.  Patient was given the opportunity to ask questions.  Patient verbalized understanding of the plan and was able to repeat key elements of the plan.   No orders of the defined types were placed in this encounter.    Requested Prescriptions    No prescriptions  requested or ordered in this encounter   F/u in 1 mth Jonah Blueeborah Johnson, MD, Jerrel IvoryFACP

## 2017-03-02 NOTE — ED Provider Notes (Signed)
11:35 PM Patient care assumed from [facility], PA-C at change of shift. Patient presenting for increased tachycardia and sensation of anxiety after receiving a steroid injection. IV fluids ordered as well as Ativan. Patient was noted to have persistent tachycardia despite these measures. It was decided to perform a CT angiogram at this time to further evaluate persistent tachycardia.  CT has been reviewed. CT is negative for pulmonary embolus. No other signs to suggest dissection. On my assessment, tachycardia has improved to 106 bpm. Hypertension has also improved to 132/70's. When patient begins to engage in conversation and discuss symptoms, tachycardia worsens to 120's. This resolves again when the patient is calm.  I do suspect cause of tachycardia to be related to patient's anxiety. Given his reassuring workup, I do not believe there is concern for underlying emergent process. The patient has been instructed to follow-up with his primary care doctor regarding his visit today. Return precautions discussed and provided. Patient discharged in stable condition with no unaddressed concerns.   Vitals:   03/02/17 2215 03/02/17 2230 03/02/17 2245 03/02/17 2300  BP: 137/69 136/75 135/78 121/62  Pulse:  (!) 115 (!) 109 (!) 102  Resp: (!) 22 (!) 22 (!) 23 (!) 25  Temp:      TempSrc:      SpO2:  95% 95% 96%   Ct Angio Chest Pe W And/or Wo Contrast  Result Date: 03/02/2017 CLINICAL DATA:  Acute onset of shortness of breath and generalized chest pain. Diaphoresis. Initial encounter. EXAM: CT ANGIOGRAPHY CHEST WITH CONTRAST TECHNIQUE: Multidetector CT imaging of the chest was performed using the standard protocol during bolus administration of intravenous contrast. Multiplanar CT image reconstructions and MIPs were obtained to evaluate the vascular anatomy. CONTRAST:  100 mL of Isovue 370 IV contrast COMPARISON:  Chest radiograph performed earlier today at 5:59 p.m. FINDINGS: Cardiovascular:  There is no  evidence of pulmonary embolus. The heart is normal in size. Scattered coronary artery calcifications are seen. The thoracic aorta is unremarkable. The great vessels are grossly unremarkable. Mediastinum/Nodes: The mediastinum is unremarkable in appearance. No mediastinal lymphadenopathy is seen. No pericardial effusion is identified. The thyroid gland is grossly unremarkable. No axillary lymphadenopathy is seen. Lungs/Pleura: Minimal bibasilar atelectasis is noted. No pleural effusion or pneumothorax is seen. No masses are identified. Upper Abdomen: The visualized portions of the liver and spleen are unremarkable. The gallbladder, and visualized portions of the pancreas, adrenal glands and kidneys are within normal limits. Musculoskeletal: No acute osseous abnormalities are identified. The visualized musculature is unremarkable in appearance. Review of the MIP images confirms the above findings. IMPRESSION: 1. No evidence of pulmonary embolus. 2. Minimal bibasilar atelectasis noted.  Lungs otherwise clear. 3. Scattered coronary artery calcifications seen. Electronically Signed   By: Roanna RaiderJeffery  Chang M.D.   On: 03/02/2017 22:51      Wesley [name], Kelly, PA-C 03/02/17 2341    Wesley [name], David, MD 03/09/17 [phone]

## 2017-03-02 NOTE — Patient Instructions (Signed)
Take the Hydroxyzine and Lexapro every day as prescribed.  [facility].  Continue Amlodipine and Hydrochlorothiazide.

## 2017-03-04 ENCOUNTER — Emergency Department (HOSPITAL_COMMUNITY)
Admission: EM | Admit: 2017-03-04 | Discharge: 2017-03-04 | Disposition: A | Discharge status: Home / Self Care | Payer: Self-pay | Attending: Emergency Medicine | Admitting: Emergency Medicine

## 2017-03-04 ENCOUNTER — Encounter (HOSPITAL_COMMUNITY): Payer: Self-pay

## 2017-03-04 DIAGNOSIS — R030 Elevated blood-pressure reading, without diagnosis of hypertension: Secondary | ICD-10-CM | POA: Insufficient documentation

## 2017-03-04 DIAGNOSIS — I10 Essential (primary) hypertension: Secondary | ICD-10-CM | POA: Insufficient documentation

## 2017-03-04 DIAGNOSIS — Z79899 Other long term (current) drug therapy: Secondary | ICD-10-CM | POA: Insufficient documentation

## 2017-03-04 DIAGNOSIS — Z5321 Procedure and treatment not carried out due to patient leaving prior to being seen by health care provider: Secondary | ICD-10-CM | POA: Insufficient documentation

## 2017-03-04 NOTE — ED Notes (Signed)
Pt called but is not in lobby x 3

## 2017-03-04 NOTE — ED Triage Notes (Signed)
Patient wants BP checked after being upstairs visiting friend. States that he felt hot and thinks his BP up. Alert and oriented, VSS. Took BP meds 2 hours before arriving at hospital

## 2017-03-04 NOTE — ED Notes (Signed)
Pt not in waiting room. Called name.

## 2017-03-04 NOTE — ED Provider Notes (Signed)
Pt left AMA before I have a chance to evaluate.     Fayrene Helperran, Bowie, PA-C 03/04/17 1445    Vanetta MuldersZackowski, Scott, MD 03/11/17 [phone]

## 2017-03-04 NOTE — ED Notes (Signed)
Unable to locate pt in the lobby at this time 

## 2017-03-05 ENCOUNTER — Telehealth: Payer: Self-pay

## 2017-03-05 ENCOUNTER — Encounter (HOSPITAL_COMMUNITY): Payer: Self-pay | Admitting: *Deleted

## 2017-03-05 ENCOUNTER — Ambulatory Visit: Payer: Self-pay | Attending: Internal Medicine | Admitting: Pharmacist

## 2017-03-05 ENCOUNTER — Telehealth: Payer: Self-pay | Admitting: Internal Medicine

## 2017-03-05 ENCOUNTER — Emergency Department (HOSPITAL_COMMUNITY)
Admission: EM | Admit: 2017-03-05 | Discharge: 2017-03-06 | Disposition: A | Discharge status: Home / Self Care | Payer: Self-pay | Attending: Emergency Medicine | Admitting: Emergency Medicine

## 2017-03-05 VITALS — BP 125/86

## 2017-03-05 DIAGNOSIS — I10 Essential (primary) hypertension: Secondary | ICD-10-CM | POA: Insufficient documentation

## 2017-03-05 DIAGNOSIS — Z79899 Other long term (current) drug therapy: Secondary | ICD-10-CM | POA: Insufficient documentation

## 2017-03-05 DIAGNOSIS — G44209 Tension-type headache, unspecified, not intractable: Secondary | ICD-10-CM | POA: Insufficient documentation

## 2017-03-05 DIAGNOSIS — F419 Anxiety disorder, unspecified: Secondary | ICD-10-CM | POA: Insufficient documentation

## 2017-03-05 LAB — I-STAT CHEM 8, ED
BUN: 17 mg/dL (ref 6–20)
Calcium, Ion: 1.14 mmol/L — ABNORMAL LOW (ref 1.15–1.40)
Chloride: 101 mmol/L (ref 101–111)
Creatinine, Ser: 0.7 mg/dL (ref 0.61–1.24)
Glucose, Bld: 111 mg/dL — ABNORMAL HIGH (ref 65–99)
HCT: 47 % (ref 39.0–52.0)
Hemoglobin: 16 g/dL (ref 13.0–17.0)
Potassium: 4.2 mmol/L (ref 3.5–5.1)
Sodium: 137 mmol/L (ref 135–145)
TCO2: 26 mmol/L (ref 0–100)

## 2017-03-05 NOTE — Telephone Encounter (Signed)
Jane  CM and I met with patient while he was in the clinic today to have his bp checked. Encouraged and explained to patient the importance of coming to clinic to have his bp checked and see LCSW or other staff members, rather than going to ED like he has in the past unless it's a medical emergency. Discussed the importance of primary care and coordinating his medical needs rather than going to the ED. Explained that if [facility] cannot meet his needs, he will need to seek another primary care provider. Pt understood and stated that he will do that. Spoke to patient about scheduling appointments at [facility] to have his bp checked daily. Offered to schedule an appt but patient stated that he will call us.    Also expressed to patient the importance of keeping his appointment at [facility] this Thursday and how they will help him cope with his anxiety. Pt stated that his anxiety may have been triggered after he witnessed his co-worker die. Patient has a fear of dying.  Discussed with Jasmine, LCSW and she met with patient.

## 2017-03-05 NOTE — Telephone Encounter (Addendum)
It was noted today that the patient was at the [facility] to have his BP checked. This CM spoke to Dr Doreene Burke regarding the patient's frequent ED visits to [facility] and various EDs in the area. Dr Doreene Burke explained that the CM in the ED should be contacted and the patient should be noted as an ED over-utilizer in  EPIC and and personalized care plan needs to be developed with instructions to send the patient to [facility] Dr Doreene Burke also noted that the patient needs to be told that he can come to the [facility] daily when the clinic is open and have his BP checked and/or speak to this CM, the LCSW or pharmacist  instead of going to the ED.  He also should be informed that if he is not complying with his PCP and [place] recommendations, he may need to locate another PCP.     This CM met with the patient when he was in the clinic today and discussed the need to work with [facility] to manage his medical care needs in an effort to avoid ED visits if possible. This CM met with the patient in conjunction with Kellie Moor, [facility] TCC coordinator. Please refer to J. Borba's note from today for more information.  This CM spoke to Fuller Mandril , RN CM - [facility] ED and informed her of Dr Christa See request to identify the patient as an ED over-utilizer and develop a personalized care plan to direct the patient back to [facility].  She stated that she would follow through

## 2017-03-05 NOTE — ED Triage Notes (Signed)
To ED for eval of HA and feeling anxious. States the HA started suddenly - points to back of head and states it doesn't move from there. No injury. No vomiting. Pt takes HTN meds and Anxiety meds. Has taken them today. Calm and cooperative. Appropriate interaction.

## 2017-03-05 NOTE — Progress Notes (Signed)
   S:    Patient arrives in good spirits. Presents to the clinic for hypertension evaluation as a walk in. He brings his medications with him to clinic today.   Patient reports adherence with medications.   Current BP Medications include:  Amlodipine 5 mg daily, HCTZ 12.5 mg daily.   Patient was recently discharged from hospital and all medications have been reviewed.  Patient reports going to the ED frequently for anxiety and weakness. He reports taking his anxiety medications.  O:   Last 3 Office BP readings: BP Readings from Last 3 Encounters:  03/05/17 125/86  03/04/17 (!) 136/104  03/02/17 121/62    BMET    Component Value Date/Time   NA 133 (L) 03/02/2017 1652   K 3.7 03/02/2017 1652   CL 102 03/02/2017 1652   CO2 19 (L) 03/02/2017 1652   GLUCOSE 216 (H) 03/02/2017 1652   BUN 9 03/02/2017 1652   CREATININE 1.01 03/02/2017 1652   CALCIUM 9.4 03/02/2017 1652   GFRNONAA >60 03/02/2017 1652   GFRAA >60 03/02/2017 1652    A/P: Blood pressure currently controlled on current medications.  Continued all medications as prescribed and patient will follow up with Dr. Laural BenesJohnson.  Results reviewed and written information provided.   Total time in face-to-face counseling 15 minutes.   F/U Clinic Visit with Dr. Rossie MuskratJonhson.  Patient seen with Dutch QuintKaley Whitesell, PharmD Candidate and Louis MatteJay Worthington, PharmD Candidate

## 2017-03-06 MED ORDER — LORAZEPAM 1 MG PO TABS
1.0000 mg | ORAL_TABLET | Freq: Once | ORAL | Status: AC
  Administered 2017-03-06: 1 mg via ORAL
  Filled 2017-03-06: qty 1

## 2017-03-06 NOTE — Discharge Instructions (Signed)
You were seen today for anxiety and headache. Your symptoms completely resolve. Continue your medications as directed by your primary physician and follow-up for adjustments in your anxiety medications. Your blood pressure today is 133/94 which is reasonable. Given that he had complete resolution of symptoms, no additional workup was undertaken.

## 2017-03-06 NOTE — ED Provider Notes (Signed)
[facility]-EMERGENCY DEPT Provider Note   CSN: [id number] Arrival date & time: 03/05/17  1639  By signing my name below, I, [name], attest that this documentation has been prepared under the direction and in the presence of Braylon Grenda, Mayer Masker, MD. Electronically Signed: [name], ED Scribe. 03/06/17. 12:43 AM.  History   Chief Complaint Chief Complaint  Patient presents with  . Headache    HPI Wesley [name] is a 51 y.o. male with a PMHx of HTN, anxiety, and cocaine abuse who presents to the Emergency Department complaining of a sudden HA that started earlier in the day on 03/05/17.Patient reports that he felt a very anxious this morning and took his hydralazine. He states that his anxiety medications are not helping. He had onset of headache and neck discomfort which is similar to his prior symptoms. He associates this with high blood pressure. Blood pressure here 133/94.Marland Kitchen Pt reports his sx have subsided. Around 3 pm on 03/05/17 he took his anxiety medication. He saw his PCP for a regular check-up and was told to continue taking medications as prescribed. He also reports going to a chiropractor regularly. Denies any recent neck manipulation. Pt denies CP, SOB, numbness, and LOC.  The history is provided by the patient. No language interpreter was used.    Past Medical History:  Diagnosis Date  . Alcohol abuse   . Anxiety   . Chronic neck pain   . Cocaine abuse   . Hypertension     Patient Active Problem List   Diagnosis Date Noted  . 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 Dizziness 01/23/2017  . Back pain 11/27/2016  . Bilateral posterior neck pain 11/27/2016  . Epigastric abdominal pain 11/21/2016  . T wave inversion in EKG 11/21/2016  . Nausea 11/21/2016  . Anxiety 11/21/2016    Past Surgical History:  Procedure Laterality Date  . EYE SURGERY Right ~ 1980   "hit my eye"       Home Medications    Prior to Admission medications   Medication Sig Start Date End Date  Taking? Authorizing Provider  amLODipine (NORVASC) 5 MG tablet Take 1 tablet (5 mg total) by mouth daily. 02/21/17   Anders Simmonds, PA-C  clarithromycin (BIAXIN) 500 MG tablet Take 1 tablet (500 mg total) by mouth 2 (two) times daily. Patient not taking: Reported on 02/21/2017 02/12/17   Marcine Matar, MD  cyclobenzaprine (FLEXERIL) 10 MG tablet Take 1 tablet (10 mg total) by mouth at bedtime. 03/01/17 03/06/17  Liberty Handy, PA-C  escitalopram (LEXAPRO) 10 MG tablet Take 1 tablet (10 mg total) by mouth daily. Patient not taking: Reported on 03/02/2017 02/09/17   Marcine Matar, MD  famotidine (PEPCID) 20 MG tablet Take 1 tablet (20 mg total) by mouth 2 (two) times daily. Patient not taking: Reported on 03/02/2017 01/31/17   Lorre Nick, MD  hydrochlorothiazide (HYDRODIURIL) 12.5 MG tablet Take 1 tablet (12.5 mg total) by mouth daily. 02/25/17   Elson Areas, PA-C  hydrOXYzine (ATARAX/VISTARIL) 25 MG tablet Take 1 tablet (25 mg total) by mouth 2 (two) times daily as needed for anxiety. 02/21/17   Anders Simmonds, PA-C  lidocaine (LIDODERM) 5 % Place 1 patch onto the skin daily. Remove & Discard patch within 12 hours or as directed by MD 03/01/17 03/06/17  Liberty Handy, PA-C  meclizine (ANTIVERT) 25 MG tablet Take 1 tablet (25 mg total) by mouth 3 (three) times daily as needed for dizziness. Patient not taking: Reported on 03/02/2017  02/20/17   Deatra Canter, FNP  metoCLOPramide (REGLAN) 10 MG tablet Take 1 tablet (10 mg total) by mouth every 8 (eight) hours as needed for nausea. Patient not taking: Reported on 03/02/2017 01/14/17 03/02/17  Michela Pitcher A, PA-C  omeprazole (PRILOSEC) 20 MG capsule Take 1 capsule (20 mg total) by mouth daily. Patient not taking: Reported on 03/02/2017 02/03/17   Abelino Derrick, MD  omeprazole (PRILOSEC) 20 MG capsule Take 1 capsule (20 mg total) by mouth 2 (two) times daily before a meal. Patient not taking: Reported on 03/02/2017 02/12/17    Marcine Matar, MD  Vitamin D, Ergocalciferol, (DRISDOL) 50000 units CAPS capsule Take 1 capsule (50,000 Units total) by mouth every 7 (seven) days. Patient not taking: Reported on 03/02/2017 02/09/17   Anders Simmonds, PA-C    Family History Family History  Problem Relation Age of Onset  . Hypertension Mother   . Hypertension Father     Social History Social History  Substance Use Topics  . Smoking status: Never Smoker  . Smokeless tobacco: Never Used  . Alcohol use No     Comment: 11/21/2016 "last drank 06/2016"; denies      Allergies   Motrin [ibuprofen]; Other; and Zofran [ondansetron hcl]   Review of Systems Review of Systems  Constitutional: Negative for fever.  Respiratory: Negative for shortness of breath.   Cardiovascular: Negative for chest pain.  Musculoskeletal: Positive for neck pain.  Neurological: Positive for dizziness and headaches. Negative for syncope and numbness.  Psychiatric/Behavioral: The patient is nervous/anxious.   All other systems reviewed and are negative.    Physical Exam Updated Vital Signs BP (!) 133/94   Pulse 76   Temp 98.2 F (36.8 C)   Resp 18   SpO2 99%   Physical Exam  Constitutional: He is oriented to person, place, and time. He appears well-developed and well-nourished. No distress.  HENT:  Head: Normocephalic and atraumatic.  Eyes: Pupils are equal, round, and reactive to light.  Neck: Normal range of motion. Neck supple.  Tenderness palpation right paraspinous muscle region of the cervical spine  Cardiovascular: Normal rate, regular rhythm and normal heart sounds.   No murmur heard. Pulmonary/Chest: Effort normal and breath sounds normal. No respiratory distress. He has no wheezes.  Abdominal: Soft. Bowel sounds are normal. There is no tenderness. There is no rebound.  Musculoskeletal: He exhibits no edema.  Lymphadenopathy:    He has no cervical adenopathy.  Neurological: He is alert and oriented to person,  place, and time.  Cranial nerves II through XII intact, 5 out of 5 strength in all 4 extremities, no dysmetria to finger-nose-finger  Skin: Skin is warm and dry.  Psychiatric: He has a normal mood and affect.  Nursing note and vitals reviewed.    ED Treatments / Results  DIAGNOSTIC STUDIES: Oxygen Saturation is 99% on RA, normal by my interpretation.   COORDINATION OF CARE: 12:43 AM-Discussed next steps with pt. Pt verbalized understanding and is agreeable with the plan.   Labs (all labs ordered are listed, but only abnormal results are displayed) Labs Reviewed  I-STAT CHEM 8, ED - Abnormal; Notable for the following:       Result Value   Glucose, Bld 111 (*)    Calcium, Ion 1.14 (*)    All other components within normal limits    EKG  EKG Interpretation None       Radiology No results found.  Procedures Procedures (including critical care time)  Medications Ordered in ED Medications  LORazepam (ATIVAN) tablet 1 mg (not administered)     Initial Impression / Assessment and Plan / ED Course  I have reviewed the triage vital signs and the nursing notes.  Pertinent labs & imaging results that were available during my care of the patient were reviewed by me and considered in my medical decision making (see chart for details).     Patient presents with anxiety and headache. His symptoms have completely resolved. He reports that his medications are not helping. He did report some dizziness. He has chronic neck pain. No recent manipulations.  Doubt dissection. Neurologically intact. Patient was given 1 dose of Ativan. I encouraged him to follow-up with his primary physician for adjustments in his anxiety medication.  After history, exam, and medical workup I feel the patient has been appropriately medically screened and is safe for discharge home. Pertinent diagnoses were discussed with the patient. Patient was given return precautions.   Final Clinical  Impressions(s) / ED Diagnoses   Final diagnoses:  Acute non intractable tension-type headache  Anxiety  Essential hypertension    New Prescriptions New Prescriptions   No medications on file   I personally performed the services described in this documentation, which was scribed in my presence. The recorded information has been reviewed and is accurate.     Shon BatonHorton, Lashawndra Lampkins F, MD 03/06/17 [phone]

## 2017-03-07 ENCOUNTER — Encounter (HOSPITAL_COMMUNITY): Payer: Self-pay | Admitting: *Deleted

## 2017-03-07 ENCOUNTER — Ambulatory Visit: Payer: Self-pay | Attending: Internal Medicine | Admitting: *Deleted

## 2017-03-07 ENCOUNTER — Emergency Department (HOSPITAL_COMMUNITY)
Admission: EM | Admit: 2017-03-07 | Discharge: 2017-03-07 | Disposition: A | Discharge status: Home / Self Care | Payer: Self-pay | Attending: Emergency Medicine | Admitting: Emergency Medicine

## 2017-03-07 VITALS — BP 122/91 | HR 95

## 2017-03-07 DIAGNOSIS — Z5321 Procedure and treatment not carried out due to patient leaving prior to being seen by health care provider: Secondary | ICD-10-CM | POA: Insufficient documentation

## 2017-03-07 DIAGNOSIS — Z0130 Encounter for examination of blood pressure without abnormal findings: Secondary | ICD-10-CM

## 2017-03-07 DIAGNOSIS — F419 Anxiety disorder, unspecified: Secondary | ICD-10-CM

## 2017-03-07 MED ORDER — LORAZEPAM 1 MG PO TABS
1.0000 mg | ORAL_TABLET | Freq: Once | ORAL | Status: DC
  Filled 2017-03-07: qty 1

## 2017-03-07 MED ORDER — HYDROXYZINE HCL 50 MG PO TABS: 50.0000 mg | ORAL_TABLET | Freq: Four times a day (QID) | ORAL | 1 refills | Status: DC | PRN

## 2017-03-07 MED FILL — hydrOXYzine HCL 50 MG TABS: 50 | 30 days supply | Qty: 120 | Fill #0

## 2017-03-07 NOTE — ED Triage Notes (Signed)
Pt reports feeling anxious and "shaky' all over. Hx of same. No acute distress is noted at triage.

## 2017-03-07 NOTE — Progress Notes (Signed)
[facility] Pt alert and oriented and arrives in good spirits. Last OV 03/05/2017 with Dr. Laural BenesJohnson.   Medication verified. Pt states medication was taken this morning.   Manual blood pressure reading:  122/90  Pt states he has concerns about anxiety. He states he [facility]. He has an appointment to return on 03/08/2017.  He addresses question about anxiety. He states he is  not sure why he wakes up in the middle of the night unable to go back to sleep. He denies having dreams, hearing voices in the night or  hearing voices. He states the temperature setting is comfortable in his home.  Just sleeping and feels anxious unable to sleep, only slept for 1 hour. He took Hydroxyzine this morning to help him with anxiety. He questions how often he could take medication daily. According to the sig, he has one dose remaining for today. Clinical pharmacist asked about medication and frequency for today.  Medication adjustment made to Hydroxyzine per Dr. Hyman HopesJegede.   Plan:  Take Hydroxyzine 50 mg QID as needed for anxiety. Explained to patient times to take medication today if needed. Alternatives given to patient to help him sleep:  Reading a book, get exercise, take a hot shower. Follow with Dr. Laural BenesJohnson on July 5th, 2018 at 1030.   Pt verbalized understanding. Teach back method used to address medications and alternative to sleep disturbance.

## 2017-03-07 NOTE — Progress Notes (Signed)
Patient still reports anxiety. Discussed with Dr. Hyman HopesJegede and Eustace Penravia, and will increase hydroxyzine dose to 50 mg q6h PRN anxiety per manufacturer's labeling for anxiety.

## 2017-03-07 NOTE — ED Notes (Signed)
States lexapro  Not helping he has not been taking it. Has all 30 pills filled on 6/1, also haS HYDROXYZINE WHICH WAS FILLED 6/13 AND HE HAS NOT BEEN TAKING THEM AS HE SHOULD ALSO,  States has appointment 7/5. Pt states not sleeping

## 2017-03-07 NOTE — ED Notes (Signed)
Pt left w/o his meds states he is leaving

## 2017-03-07 NOTE — ED Notes (Signed)
Pt left. 

## 2017-03-07 NOTE — ED Provider Notes (Cosign Needed)
Went to assess patient. Had phone calls to toxic phone call for orthopedic surgeon. The patient was walking up the room. Asked patient if he was leaving he states yes that he does not want to be seen and is living. He appeared to be in no acute distress. Vital signs are normal in the ED. Patient is well-known to the ED for similar symptoms. As patient was leaving I encouraged him to stay he refuses. Encouraged to return to ED if he develops any worsening symptoms.   Rise MuLeaphart, Kenneth T, PA-C 03/07/17 1734

## 2017-03-09 ENCOUNTER — Emergency Department (HOSPITAL_COMMUNITY): Payer: Self-pay

## 2017-03-09 ENCOUNTER — Emergency Department (HOSPITAL_COMMUNITY)
Admission: EM | Admit: 2017-03-09 | Discharge: 2017-03-09 | Disposition: A | Discharge status: Home / Self Care | Payer: Self-pay | Attending: Emergency Medicine | Admitting: Emergency Medicine

## 2017-03-09 ENCOUNTER — Encounter (HOSPITAL_COMMUNITY): Payer: Self-pay | Admitting: Emergency Medicine

## 2017-03-09 DIAGNOSIS — F419 Anxiety disorder, unspecified: Secondary | ICD-10-CM | POA: Insufficient documentation

## 2017-03-09 DIAGNOSIS — Z79899 Other long term (current) drug therapy: Secondary | ICD-10-CM | POA: Insufficient documentation

## 2017-03-09 DIAGNOSIS — I10 Essential (primary) hypertension: Secondary | ICD-10-CM | POA: Insufficient documentation

## 2017-03-09 MED ORDER — LORAZEPAM 1 MG PO TABS: 1.0000 mg | ORAL_TABLET | Freq: Every day | ORAL | 0 refills | Status: AC

## 2017-03-09 MED ORDER — LORAZEPAM 1 MG PO TABS
1.0000 mg | ORAL_TABLET | Freq: Once | ORAL | Status: AC
  Administered 2017-03-09: 1 mg via ORAL
  Filled 2017-03-09: qty 1

## 2017-03-09 NOTE — Discharge Instructions (Signed)
Su CT scan resulto normal. No tiene lesin en su columna cervical.   Por favor vaya a [facility] hoy dia despues de salir de la sala de emergencia para hablar con una terapista sobre su ansiedad.  Le he dado [place] receta para 4 pastillas de Ativan, tome una cada noche antes de dormir.  Es importante que usted sepa que la sala de emergencia NO PUEDE DAR RECETA DE LORAZAPAM MAD DE 3-5 DIAS O MAS DE UNA VEZ.  Esta pastilla es peligrosa y puede causar dependencia. Haga seguimiento con su doctora general para conversar Passenger transport managersobre esta medicina.   Vaya a su cita con [facility] en proximo [place].  [facility] le ayudara a Ship brokerestablecer cuidado psiquitrico y si es necesario cambiaran sus medicamentos.  Your CT scan was normal, you do not have cervical spine injuries.   [facility] after leaving the emergency room to talk to a therapist about managing your anxiety.  I have given you a prescription for 4 pills of Lorazapam for anxiety, take one one before bed time until your [facility]. It is important that you understand that the emergency department CANNOT PRESCRIBED LORAZAPAM REGULARLY OR MORE THAN 3-5 DAYS, follow up with your PCP for further discussion of this medication. This medication is dangerous as it can lead to dependence.   [facility] where you can talk to a therapist/psychiatrist and they may be able to alter your medications

## 2017-03-09 NOTE — ED Triage Notes (Signed)
Pt woke up around 1am and reports that he is experiencing tremors in his head.  He states this happens often.  No neuro deficits at this time.  Is unable to sleep.

## 2017-03-09 NOTE — ED Notes (Signed)
Pt states he understands instructions and prescription. Pt given written information in english and spanish. Home stable with steady gait with wife.
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
## 2017-03-09 NOTE — ED Notes (Signed)
Patient transported to CT 

## 2017-03-10 ENCOUNTER — Emergency Department (HOSPITAL_COMMUNITY)
Admission: EM | Admit: 2017-03-10 | Discharge: 2017-03-10 | Disposition: A | Discharge status: Home / Self Care | Payer: Self-pay | Attending: Emergency Medicine | Admitting: Emergency Medicine

## 2017-03-10 ENCOUNTER — Encounter (HOSPITAL_COMMUNITY): Payer: Self-pay | Admitting: Emergency Medicine

## 2017-03-10 DIAGNOSIS — F419 Anxiety disorder, unspecified: Secondary | ICD-10-CM | POA: Insufficient documentation

## 2017-03-10 DIAGNOSIS — Z79899 Other long term (current) drug therapy: Secondary | ICD-10-CM | POA: Insufficient documentation

## 2017-03-10 DIAGNOSIS — R0602 Shortness of breath: Secondary | ICD-10-CM | POA: Insufficient documentation

## 2017-03-10 DIAGNOSIS — I10 Essential (primary) hypertension: Secondary | ICD-10-CM | POA: Insufficient documentation

## 2017-03-10 NOTE — ED Triage Notes (Signed)
Pt c/o "My body feels dizzy and I am short of breath." Pt talking in complete sentences without difficulty. Pt reports tried taking hydroxyzine 1 hour PTA without relief.

## 2017-03-10 NOTE — ED Provider Notes (Signed)
[facility]-EM[facility] DEPT Provider Note   CSN: [id number] Arrival date & time: 03/10/17  1241     History   Chief Complaint Chief Complaint  Patient presents with  . Neck Pain  . Shortness of Breath    HPI Wesley [name] is a 51 y.o. male.  HPI  Patient presents with concern anxiousness, hypertension Patient allergies multiple medical issues including anxiety, hypertension. Over the past few hours patient has had anxiousness in spite of taking his typical medication.  Currently he states that he feels better, earlier today had shakiness. Hospital, the patient has been using both hydroxyzine and lorazepam for symptom control, feels as though symptoms are worse when using both of these medication. No other new complaints including chest pain, dyspnea, syncope. He is here with his wife assists with the history of present illness.   Past Medical History:  Diagnosis Date  . Alcohol abuse   . Anxiety   . Chronic neck pain   . Cocaine abuse   . Hypertension     Patient Active Problem List   Diagnosis Date Noted  . 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 Dizziness 01/23/2017  . Back pain 11/27/2016  . Bilateral posterior neck pain 11/27/2016  . Epigastric abdominal pain 11/21/2016  . T wave inversion in EKG 11/21/2016  . Nausea 11/21/2016  . Anxiety 11/21/2016    Past Surgical History:  Procedure Laterality Date  . EYE SURGERY Right ~ 1980   "hit my eye"       Home Medications    Prior to Admission medications   Medication Sig Start Date End Date Taking? Authorizing Provider  amLODipine (NORVASC) 5 MG tablet Take 1 tablet (5 mg total) by mouth daily. 02/21/17   Anders SimmondsMcClung, Angela M, PA-C  clarithromycin (BIAXIN) 500 MG tablet Take 1 tablet (500 mg total) by mouth 2 (two) times daily. Patient not taking: Reported on 02/21/2017 02/12/17   Marcine MatarJohnson, Deborah B, MD  escitalopram (LEXAPRO) 10 MG tablet Take 1 tablet (10 mg total) by mouth daily. 02/09/17   Marcine MatarJohnson, Deborah B, MD  famotidine  (PEPCID) 20 MG tablet Take 1 tablet (20 mg total) by mouth 2 (two) times daily. Patient not taking: Reported on 03/02/2017 01/31/17   Lorre NickAllen, Anthony, MD  hydrochlorothiazide (HYDRODIURIL) 12.5 MG tablet Take 1 tablet (12.5 mg total) by mouth daily. 02/25/17   Elson AreasSofia, Leslie K, PA-C  hydrOXYzine (ATARAX/VISTARIL) 50 MG tablet Take 1 tablet (50 mg total) by mouth every 6 (six) hours as needed for anxiety. 03/07/17   Quentin AngstJegede, Olugbemiga E, MD  LORazepam (ATIVAN) 1 MG tablet Take 1 tablet (1 mg total) by mouth at bedtime. 03/09/17 03/13/17  Liberty HandyGibbons, Claudia J, PA-C  meclizine (ANTIVERT) 25 MG tablet Take 1 tablet (25 mg total) by mouth 3 (three) times daily as needed for dizziness. Patient not taking: Reported on 03/02/2017 02/20/17   Deatra Canterxford, William J, FNP  metoCLOPramide (REGLAN) 10 MG tablet Take 1 tablet (10 mg total) by mouth every 8 (eight) hours as needed for nausea. Patient not taking: Reported on 03/02/2017 01/14/17 03/02/17  Michela PitcherFawze, Mina A, PA-C  omeprazole (PRILOSEC) 20 MG capsule Take 1 capsule (20 mg total) by mouth daily. Patient not taking: Reported on 03/02/2017 02/03/17   Abelino DerrickMackuen, Courteney Lyn, MD  omeprazole (PRILOSEC) 20 MG capsule Take 1 capsule (20 mg total) by mouth 2 (two) times daily before a meal. Patient not taking: Reported on 03/02/2017 02/12/17   Marcine MatarJohnson, Deborah B, MD  Vitamin D, Ergocalciferol, (DRISDOL) 50000 units CAPS capsule Take 1 capsule (  50,000 Units total) by mouth every 7 (seven) days. Patient not taking: Reported on 03/02/2017 02/09/17   Anders Simmonds, PA-C    Family History Family History  Problem Relation Age of Onset  . Hypertension Mother   . Hypertension Father     Social History Social History  Substance Use Topics  . Smoking status: Never Smoker  . Smokeless tobacco: Never Used  . Alcohol use No     Comment: 11/21/2016 "last drank 06/2016"; denies      Allergies   Motrin [ibuprofen]; Other; and Zofran [ondansetron hcl]   Review of Systems Review of  Systems  Constitutional:       Per HPI, otherwise negative  HENT:       Per HPI, otherwise negative  Respiratory:       Per HPI, otherwise negative  Cardiovascular:       Per HPI, otherwise negative  Gastrointestinal: Negative for vomiting.  Endocrine:       Negative aside from HPI  Genitourinary:       Neg aside from HPI   Musculoskeletal:       Per HPI, otherwise negative  Skin: Negative.   Neurological: Negative for syncope.  Psychiatric/Behavioral: The patient is nervous/anxious.      Physical Exam Updated Vital Signs BP (!) 161/111 (BP Location: Left Arm)   Pulse (!) 114   Temp 97.9 F (36.6 C) (Oral)   Resp 18   Ht 5\' 6"  (1.676 m)   Wt 83.9 kg (185 lb)   SpO2 100%   BMI 29.86 kg/m   Physical Exam  Constitutional: He is oriented to person, place, and time. He appears well-developed. No distress.  HENT:  Head: Normocephalic and atraumatic.  Eyes: Conjunctivae and EOM are normal.  Cardiovascular: Normal rate and regular rhythm.   Pulmonary/Chest: Effort normal. No stridor. No respiratory distress.  Abdominal: He exhibits no distension.  Musculoskeletal: He exhibits no edema.  Neurological: He is alert and oriented to person, place, and time.  Skin: Skin is warm and dry.  Psychiatric: His mood appears anxious.  Nursing note and vitals reviewed.    ED Treatments / Results   Chart review notable for 3 CT scans within the past 2 weeks, for emergency department visits. Vital signs today consistent with multiple prior studies including mild tachycardia.  Procedures Procedures (including critical care time)  Medications Ordered in ED Medications - No data to display   Initial Impression / Assessment and Plan / ED Course  I have reviewed the triage vital signs and the nursing notes.  Pertinent labs & imaging results that were available during my care of the patient were reviewed by me and considered in my medical decision making (see chart for  details).  Patient with multiple chronic white securing anxiousness presents with worsening anxiety and shaking. Here he was awake, alert, in no distress. Some suspicion for medication is contributing to his anxiousness. No evidence for new pathology. He, his wife and I had a very lengthy conversation about following up with primary care to discuss all medications and his ongoing concerns. Absent evidence for new acute pathology, the patient was discharged in stable condition.  Final Clinical Impressions(s) / ED Diagnoses   Final diagnoses:  Anxiousness    New Prescriptions New Prescriptions   No medications on file     Gerhard Munch, MD 03/10/17 1538

## 2017-03-10 NOTE — Discharge Instructions (Signed)
As discussed, your evaluation today has been largely reassuring.  But, it is important that you monitor your condition carefully, and do not hesitate to return to the ED if you develop new, or concerning changes in your condition.  Be sure to discuss your medications with your physician on Monday

## 2017-03-12 ENCOUNTER — Emergency Department (HOSPITAL_COMMUNITY)
Admission: EM | Admit: 2017-03-12 | Discharge: 2017-03-12 | Disposition: A | Discharge status: Home / Self Care | Payer: Self-pay | Attending: Emergency Medicine | Admitting: Emergency Medicine

## 2017-03-12 ENCOUNTER — Ambulatory Visit: Payer: Self-pay | Attending: Internal Medicine | Admitting: *Deleted

## 2017-03-12 ENCOUNTER — Telehealth: Payer: Self-pay

## 2017-03-12 ENCOUNTER — Encounter (HOSPITAL_COMMUNITY): Payer: Self-pay | Admitting: Emergency Medicine

## 2017-03-12 VITALS — BP 112/90 | HR 80

## 2017-03-12 DIAGNOSIS — M25562 Pain in left knee: Secondary | ICD-10-CM | POA: Insufficient documentation

## 2017-03-12 DIAGNOSIS — Z0130 Encounter for examination of blood pressure without abnormal findings: Secondary | ICD-10-CM | POA: Insufficient documentation

## 2017-03-12 DIAGNOSIS — M25561 Pain in right knee: Secondary | ICD-10-CM | POA: Insufficient documentation

## 2017-03-12 DIAGNOSIS — M549 Dorsalgia, unspecified: Secondary | ICD-10-CM | POA: Insufficient documentation

## 2017-03-12 NOTE — Progress Notes (Signed)
[facility]  Pt request  more medication: Lorazepam 1 mg. He went to ED for anxiety and received this medication and states this helps. He states the Hydroxizine does not help. Writer went over the directions on how he takes this. He states, "One pill three times daily". Reminded patient that last time he was here his dose was changed and can take two pills four times daily. Pt verbalized understanding.  Encouraged patient to keep appointment with Dr. Laural BenesJohnson and reminded of appointment.  He has concerns that he needs a Neurology referral for anxiety. Reinerated to patient to follow up with Dr Laural BenesJohnson and that [facility] for [facility].  SW and CM spoke to patient to provide further assistance.

## 2017-03-12 NOTE — ED Notes (Addendum)
No answer x1 when called for room

## 2017-03-12 NOTE — ED Triage Notes (Signed)
Pt to ED with c/o pain in bil knees and left upper \\back  pain.  Onset this pm.  St's he took Lorazepam this am for feeling anxious

## 2017-03-12 NOTE — Telephone Encounter (Signed)
Met with the patient, along with Christa See, LCSW when he was in the clinic today for a BP check. He stated that he did not want a Teaching laboratory technician and noted that he understands [place]. He was accompanied by his wife.    Explained to him that we are aware that he has been seeking care a various EDs in the area  and it is difficult to manage his care when he is seeing different providers and  providing different information to those providers.  Stressed that it is important to work with his PCP to manage his care. Also reminded him that he needs to complete his application for the [facility] and he acknowledged that he needs to obtain one more document to complete the application.   He scheduled an appointment for at BP check at [facility] tomorrow and he has an appointment with Dr Wynetta Emery on 03/15/17.   He denied having any problems with transportation and housing, noting that he and his wife live with his brother.   Christa See, LCSW stressed with him the importance of going to [facility] and completing the intake process so that he may get his behavioral health medications and obtain counseling to address his anxiety if he is interested.

## 2017-03-13 ENCOUNTER — Telehealth: Payer: Self-pay

## 2017-03-13 ENCOUNTER — Ambulatory Visit: Payer: Self-pay | Attending: Internal Medicine

## 2017-03-13 VITALS — BP 121/84 | HR 90 | Resp 16

## 2017-03-13 DIAGNOSIS — Z0130 Encounter for examination of blood pressure without abnormal findings: Secondary | ICD-10-CM | POA: Insufficient documentation

## 2017-03-13 DIAGNOSIS — I10 Essential (primary) hypertension: Secondary | ICD-10-CM | POA: Insufficient documentation

## 2017-03-13 NOTE — Telephone Encounter (Signed)
This CM met with the patient when he was in the clinic today to have his BP checked.  BP - 121/84. He was in the ED last night but left before being seen by the provider. He stated that he was having pain in his lower extremities just above his knee and also pain in the back of  his neck and upper back. He stated that he has not been working for over a month.  He noted that he had also been feeling dizzy. He stated that he has been drinking water and the home that he is staying in is air conditioned; but he has no air conditioning in his car.  He said that last night he was thinking about the new problems - knee and neck pain and went to the ED.  He said that he didn't want to wait and just left.   Reviewed with him when he should be going to the ED and encouraged him to contact the [facility] if the problem is not an emergency and it is a time when the clinic is open. . Also discussed the importance of following up with [facility]. He stated that he did not go there yesterday but will go today.   He said that in addition to his wife , he lives with his brother and brother's wife and his brother's 3 children in a 4 bedroom house. He did not report any problems with the housing.   He showed this CM his bottle of hydroxyzine and said that he took it at 0900 and noted that he is only taking it once a day. This CM had him read the instructions on the bottle and he was able to do so with little correction.  When asked when 6 hours from 0900 was he said 1800; not 1500.  This CM discussed with Theda Sers, [facility] and provided him with a written schedule for taking the medication every 6 hours as needed for his anxiety. He was able to read the schedule back to this CM.    Reminded the patient that he has an appointment with Dr Wynetta Emery on 03/15/17 _0 . Hr noted that he will ask her about having his cholesterol checked. Also reminded him of the importance of going to [employer].   Update provided to Dr Wynetta Emery.

## 2017-03-13 NOTE — Progress Notes (Signed)
Pt came in the office today for a bp check

## 2017-03-13 NOTE — Telephone Encounter (Signed)
Attempted to contact the patient to inquire how he is feeling and if he took the hydroxyzine this afternoon and if he went to [facility]. Call placed to # [phone] with the assistance of Spanish interpreter # [phone] with [facility] [employer] [facility].  Voicemail message left notifying him that [facility] was calling.

## 2017-03-14 ENCOUNTER — Emergency Department (HOSPITAL_COMMUNITY)
Admission: EM | Admit: 2017-03-14 | Discharge: 2017-03-14 | Disposition: A | Discharge status: Home / Self Care | Payer: Self-pay | Attending: Emergency Medicine | Admitting: Emergency Medicine

## 2017-03-14 ENCOUNTER — Encounter (HOSPITAL_COMMUNITY): Payer: Self-pay

## 2017-03-14 DIAGNOSIS — F419 Anxiety disorder, unspecified: Secondary | ICD-10-CM | POA: Insufficient documentation

## 2017-03-14 DIAGNOSIS — I10 Essential (primary) hypertension: Secondary | ICD-10-CM | POA: Insufficient documentation

## 2017-03-14 DIAGNOSIS — M542 Cervicalgia: Secondary | ICD-10-CM | POA: Insufficient documentation

## 2017-03-14 DIAGNOSIS — Z79899 Other long term (current) drug therapy: Secondary | ICD-10-CM | POA: Insufficient documentation

## 2017-03-14 NOTE — ED Notes (Signed)
Pt reports he is here to ask the doctor a question about his medication Hydroxyzine. He has two bottles, one is 25mg  and one is 50mg . He wants to know if it is okay for him to take the 25mg  instead of the 50mg  if he doesn't want to take a 50mg  dose. Pt instructed to follow up with the physician who prescribed the medications. Pt stated he would like to remain a patient in the ER to ask a doctor about it tonight.

## 2017-03-14 NOTE — Discharge Instructions (Signed)
Continue taking your hydroxyzine as previously prescribed.  Follow-up with your primary Dr. tomorrow as scheduled.

## 2017-03-14 NOTE — ED Provider Notes (Signed)
[facility]-EMERGENCY DEPT Provider Note   CSN: [id number] Arrival date & time: 03/14/17  0224     History   Chief Complaint Chief Complaint  Patient presents with  . Medication Question    HPI Wesley [name] is a 51 y.o. male.  Patient is a 51 year old male well known to the emergency department for frequent visits involving various painful conditions, anxiety. He presents today for evaluation of neck discomfort and "shakiness" in his leg. He states that this started this evening, however reviewing the record it appears as though this is been going on much longer. He denies any injury or trauma. He denies any bowel or bladder complaints. He has a bottle of hydroxyzine he has been prescribed by another physician in his possession and is questioning the appropriate dose of this.   The history is provided by the patient.    Past Medical History:  Diagnosis Date  . Alcohol abuse   . Anxiety   . Chronic neck pain   . Cocaine abuse   . Hypertension     Patient Active Problem List   Diagnosis Date Noted  . BP check 03/13/2017  . 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 Dizziness 01/23/2017  . Back pain 11/27/2016  . Bilateral posterior neck pain 11/27/2016  . Epigastric abdominal pain 11/21/2016  . T wave inversion in EKG 11/21/2016  . Nausea 11/21/2016  . Anxiety 11/21/2016    Past Surgical History:  Procedure Laterality Date  . EYE SURGERY Right ~ 1980   "hit my eye"       Home Medications    Prior to Admission medications   Medication Sig Start Date End Date Taking? Authorizing Provider  amLODipine (NORVASC) 5 MG tablet Take 1 tablet (5 mg total) by mouth daily. 02/21/17   Anders Simmonds, PA-C  escitalopram (LEXAPRO) 10 MG tablet Take 1 tablet (10 mg total) by mouth daily. 02/09/17   Marcine Matar, MD  hydrochlorothiazide (HYDRODIURIL) 12.5 MG tablet Take 1 tablet (12.5 mg total) by mouth daily. 02/25/17   Elson Areas, PA-C  hydrOXYzine (ATARAX/VISTARIL) 50 MG tablet Take 1  tablet (50 mg total) by mouth every 6 (six) hours as needed for anxiety. 03/07/17   Quentin Angst, MD  metoCLOPramide (REGLAN) 10 MG tablet Take 1 tablet (10 mg total) by mouth every 8 (eight) hours as needed for nausea. Patient not taking: Reported on 03/02/2017 01/14/17 03/02/17  Michela Pitcher A, PA-C  omeprazole (PRILOSEC) 20 MG capsule Take 1 capsule (20 mg total) by mouth daily. Patient not taking: Reported on 03/02/2017 02/03/17   Mackuen, Cindee Salt, MD  Vitamin D, Ergocalciferol, (DRISDOL) 50000 units CAPS capsule Take 1 capsule (50,000 Units total) by mouth every 7 (seven) days. Patient not taking: Reported on 03/02/2017 02/09/17   Anders Simmonds, PA-C    Family History Family History  Problem Relation Age of Onset  . Hypertension Mother   . Hypertension Father     Social History Social History  Substance Use Topics  . Smoking status: Never Smoker  . Smokeless tobacco: Never Used  . Alcohol use No     Comment: 11/21/2016 "last drank 06/2016"; denies      Allergies   Motrin [ibuprofen]; Other; and Zofran [ondansetron hcl]   Review of Systems Review of Systems  All other systems reviewed and are negative.    Physical Exam Updated Vital Signs BP (!) 128/94 (BP Location: Left Arm)   Pulse 78   Temp 98.5 F (36.9 C) (Oral)  Resp 18   SpO2 99%   Physical Exam  Constitutional: He is oriented to person, place, and time. He appears well-developed and well-nourished. No distress.  HENT:  Head: Normocephalic and atraumatic.  Mouth/Throat: Oropharynx is clear and moist.  Neck: Normal range of motion. Neck supple.  Cardiovascular: Normal rate and regular rhythm.  Exam reveals no friction rub.   No murmur heard. Pulmonary/Chest: Effort normal and breath sounds normal. No respiratory distress. He has no wheezes. He has no rales.  Abdominal: Soft. Bowel sounds are normal. He exhibits no distension. There is no tenderness.  Musculoskeletal: Normal range of motion. He  exhibits no edema.  Neurological: He is alert and oriented to person, place, and time. Coordination normal.  Skin: Skin is warm and dry. He is not diaphoretic.  Nursing note and vitals reviewed.    ED Treatments / Results  Labs (all labs ordered are listed, but only abnormal results are displayed) Labs Reviewed - No data to display  EKG  EKG Interpretation None       Radiology No results found.  Procedures Procedures (including critical care time)  Medications Ordered in ED Medications - No data to display   Initial Impression / Assessment and Plan / ED Course  I have reviewed the triage vital signs and the nursing notes.  Pertinent labs & imaging results that were available during my care of the patient were reviewed by me and considered in my medical decision making (see chart for details).  Patient presents here for evaluation of neck and leg discomfort that appears chronic. He recently had a CT scan of his neck and other various imaging studies for similar complaints. I see no indication for any further imaging or laboratory studies. The patient appears clinically well. He is ambulatory without difficulty and strength is 5 out of 5 throughout. I did my best to answer his many questions regarding his medications. He can follow-up with his doctor tomorrow as he has already made arrangements to do. I see nothing today appears emergent.  Final Clinical Impressions(s) / ED Diagnoses   Final diagnoses:  None    New Prescriptions New Prescriptions   No medications on file     Geoffery Lyonselo, [place], MD 03/14/17 [id number]

## 2017-03-14 NOTE — ED Notes (Signed)
See EDP assessment 

## 2017-03-15 ENCOUNTER — Ambulatory Visit: Payer: Self-pay | Attending: Internal Medicine | Admitting: Internal Medicine

## 2017-03-15 ENCOUNTER — Telehealth: Payer: Self-pay | Admitting: Internal Medicine

## 2017-03-15 ENCOUNTER — Encounter: Payer: Self-pay | Admitting: Internal Medicine

## 2017-03-15 VITALS — BP 133/84 | HR 79 | Temp 98.2°F | Resp 16 | Wt 182.2 lb

## 2017-03-15 DIAGNOSIS — I10 Essential (primary) hypertension: Secondary | ICD-10-CM | POA: Insufficient documentation

## 2017-03-15 DIAGNOSIS — Z765 Malingerer [conscious simulation]: Secondary | ICD-10-CM | POA: Insufficient documentation

## 2017-03-15 DIAGNOSIS — M542 Cervicalgia: Secondary | ICD-10-CM | POA: Insufficient documentation

## 2017-03-15 DIAGNOSIS — F419 Anxiety disorder, unspecified: Secondary | ICD-10-CM | POA: Insufficient documentation

## 2017-03-15 DIAGNOSIS — M47812 Spondylosis without myelopathy or radiculopathy, cervical region: Secondary | ICD-10-CM

## 2017-03-15 DIAGNOSIS — Z79899 Other long term (current) drug therapy: Secondary | ICD-10-CM | POA: Insufficient documentation
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
## 2017-03-15 NOTE — Patient Instructions (Signed)
[facility]. Continue Hydroxyzine and

## 2017-03-15 NOTE — Progress Notes (Signed)
Patient ID: Wesley [name], male    DOB: May 25, 1966  MRN: [id number]  CC: Follow-up and Dizziness   Subjective: Wesley [name] is a 51 y.o. male who presents for f/u visit. His concerns today include:  Since last visit with me patient has been seen in the emergency room several times. Most recent for anxiety and hypertension and neck pain. Recently he had CT angiogram scan of the chest which was negative for PE and CT of the neck which revealed mild to moderate multilevel degenerative changes.  Today he presents with his male friend and requested that our caseworker Loistine Simas be present in the exam room. -He complains of anxiety. Reports compliance with the Lexapro and hydroxyzine. He feels none of these medications are working for him. -Given lorazepam from the ER which he is taking and requested a refill. However when asked whether this medicine helps him he stated that the medicine makes him feel even more anxious. -He had an appointment at [facility] in June but no showed. -He went back to [place] a week or 2 ago and started filling out paperwork but left before completion. -His male friend had told Loistine Simas that patient displays on behavior at times including audible and visual hallucinations. When asked about this patient admits to hearing voices several months ago. He could not make out what the voices were telling him. He denies any visual hallucinations.   Patient Active Problem List   Diagnosis Date Noted  . BP check 03/13/2017  . 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 Dizziness 01/23/2017  . Back pain 11/27/2016  . Bilateral posterior neck pain 11/27/2016  . Epigastric abdominal pain 11/21/2016  . T wave inversion in EKG 11/21/2016  . Nausea 11/21/2016  . Anxiety 11/21/2016     Current Outpatient Prescriptions on File Prior to Visit  Medication Sig Dispense Refill  . amLODipine (NORVASC) 5 MG tablet Take 1 tablet (5 mg total) by mouth daily. 90 tablet 3  . escitalopram (LEXAPRO) 10 MG  tablet Take 1 tablet (10 mg total) by mouth daily. 30 tablet 3  . hydrochlorothiazide (HYDRODIURIL) 12.5 MG tablet Take 1 tablet (12.5 mg total) by mouth daily. 30 tablet 1  . hydrOXYzine (ATARAX/VISTARIL) 50 MG tablet Take 1 tablet (50 mg total) by mouth every 6 (six) hours as needed for anxiety. 120 tablet 1  . metoCLOPramide (REGLAN) 10 MG tablet Take 1 tablet (10 mg total) by mouth every 8 (eight) hours as needed for nausea. (Patient not taking: Reported on 03/02/2017) 15 tablet 0  . omeprazole (PRILOSEC) 20 MG capsule Take 1 capsule (20 mg total) by mouth daily. (Patient not taking: Reported on 03/02/2017) 30 capsule 0  . Vitamin D, Ergocalciferol, (DRISDOL) 50000 units CAPS capsule Take 1 capsule (50,000 Units total) by mouth every 7 (seven) days. (Patient not taking: Reported on 03/02/2017) 16 capsule 0   No current facility-administered medications on file prior to visit.     Allergies  Allergen Reactions  . Motrin [Ibuprofen] Other (See Comments)    Makes his throat get "very dry" after taking it  . Other Other (See Comments)    Steroids: Patient received a steroid shot from [facility] (336) [phone] and it resulted in his muscles twitching all over his body  . Zofran [Ondansetron Hcl] Other (See Comments)    Causes sweating and body "feels badly"    Social History   Social History  . Marital status: Significant Other    Spouse name: N/A  . Number of  children: N/A  . Years of education: N/A   Occupational History  . Not on file.   Social History Main Topics  . Smoking status: Never Smoker  . Smokeless tobacco: Never Used  . Alcohol use No     Comment: 11/21/2016 "last drank 06/2016"; denies   . Drug use: Yes     Comment: denies, reports past history   . Sexual activity: Not on file   Other Topics Concern  . Not on file   Social History Narrative  . No narrative on file    Family History  Problem Relation Age of Onset  . Hypertension Mother   .  Hypertension Father     Past Surgical History:  Procedure Laterality Date  . EYE SURGERY Right ~ 1980   "hit my eye"    ROS: Review of Systems As stated above  PHYSICAL EXAM: BP 133/84   Pulse 79   Temp 98.2 F (36.8 C) (Oral)   Resp 16   Wt 182 lb 3.2 oz (82.6 kg)   SpO2 96%   BMI 29.41 kg/m   Physical Exam General appearance - alert, well appearing, and in no distress Mental status - alert, oriented to person, place, and time, normal mood, behavior, speech, dress, motor activity, and thought processes Chest - clear to auscultation, no wheezes, rales or rhonchi, symmetric air entry Heart - normal rate, regular rhythm, normal S1, S2, no murmurs, rubs, clicks or gallops Extremities - peripheral pulses normal, no pedal edema, no clubbing or cyanosis  ASSESSMENT AND PLAN: 1. Anxiety disorder, unspecified type -I declined prescription for lorazepam as needed for this or Valium in the past has kept him from going to the emergency room. Patient needs to get established with a psychiatrist in the community. I question whether there is some underlying schizophrenia with history of auditory hallucinations. I had the caseworker call [facility] to inquire whether we can make an appointment for him to be seen there. Apparently since the patient no showed his appointment in June and did not stay for completion of visit 2 weeks ago and they cannot make an appointment. Patient will have to walk in.  Patient was given the opportunity to ask questions.  Patient verbalized understanding of the plan and was able to repeat key elements of the plan.  [employer] interpreter used during this encounter.  No orders of the defined types were placed in this encounter.    Requested Prescriptions    No prescriptions requested or ordered in this encounter    Return in about 3 weeks (around 04/05/2017).  Jonah Blueeborah Johnson, MD, FACP

## 2017-03-16 ENCOUNTER — Other Ambulatory Visit: Payer: Self-pay

## 2017-03-16 ENCOUNTER — Encounter (HOSPITAL_COMMUNITY): Payer: Self-pay | Admitting: Emergency Medicine

## 2017-03-16 ENCOUNTER — Emergency Department (HOSPITAL_COMMUNITY): Payer: Self-pay

## 2017-03-16 ENCOUNTER — Telehealth: Payer: Self-pay | Admitting: Internal Medicine

## 2017-03-16 ENCOUNTER — Emergency Department (HOSPITAL_COMMUNITY)
Admission: EM | Admit: 2017-03-16 | Discharge: 2017-03-16 | Disposition: A | Discharge status: Home / Self Care | Payer: Self-pay | Attending: Emergency Medicine | Admitting: Emergency Medicine

## 2017-03-16 DIAGNOSIS — R079 Chest pain, unspecified: Secondary | ICD-10-CM | POA: Insufficient documentation

## 2017-03-16 DIAGNOSIS — Z7902 Long term (current) use of antithrombotics/antiplatelets: Secondary | ICD-10-CM | POA: Insufficient documentation

## 2017-03-16 DIAGNOSIS — I10 Essential (primary) hypertension: Secondary | ICD-10-CM | POA: Insufficient documentation

## 2017-03-16 DIAGNOSIS — Z79899 Other long term (current) drug therapy: Secondary | ICD-10-CM | POA: Insufficient documentation

## 2017-03-16 DIAGNOSIS — M47812 Spondylosis without myelopathy or radiculopathy, cervical region: Secondary | ICD-10-CM | POA: Insufficient documentation

## 2017-03-16 LAB — CBC
HCT: 44.6 % (ref 39.0–52.0)
Hemoglobin: 14.3 g/dL (ref 13.0–17.0)
MCH: 27.1 pg (ref 26.0–34.0)
MCHC: 32.1 g/dL (ref 30.0–36.0)
MCV: 84.5 fL (ref 78.0–100.0)
Platelets: 286 10*3/uL (ref 150–400)
RBC: 5.28 MIL/uL (ref 4.22–5.81)
RDW: 13.7 % (ref 11.5–15.5)
WBC: 11.7 10*3/uL — ABNORMAL HIGH (ref 4.0–10.5)

## 2017-03-16 LAB — BASIC METABOLIC PANEL
Anion gap: 9 (ref 5–15)
BUN: 8 mg/dL (ref 6–20)
CO2: 28 mmol/L (ref 22–32)
Calcium: 9.5 mg/dL (ref 8.9–10.3)
Chloride: 96 mmol/L — ABNORMAL LOW (ref 101–111)
Creatinine, Ser: 0.73 mg/dL (ref 0.61–1.24)
GFR calc Af Amer: >60 mL/min (ref >60)
GFR calc non Af Amer: >60 mL/min (ref >60)
Glucose, Bld: 103 mg/dL — ABNORMAL HIGH (ref 65–99)
Potassium: 3.5 mmol/L (ref 3.5–5.1)
Sodium: 133 mmol/L — ABNORMAL LOW (ref 135–145)

## 2017-03-16 LAB — I-STAT TROPONIN, ED: Troponin i, poc: 0 ng/mL (ref 0.00–0.08)

## 2017-03-16 NOTE — ED Provider Notes (Signed)
[facility]-EMERGENCY DEPT Provider Note   CSN: [id number] Arrival date & time: 03/16/17  1912     History   Chief Complaint Chief Complaint  Patient presents with  . Chest Pain    HPI Wesley [name] is a 51 y.o. male.  HPI Patient developed right-sided chest pain today at about 11 AM. He reports he was working at the time. He reports it was a discomfort. No associated symptoms. It has resolved now. Patient is also concerned about his blood pressure medications. He reports over the course of the day his pressure was sometimes up as high as 150 systolic. He reports he does take his medication regularly. Patient reports she's had chest pain in the past and been admitted to the hospital with evaluation and nothing was identified at that time. Past Medical History:  Diagnosis Date  . Alcohol abuse   . Anxiety   . Chronic neck pain   . Cocaine abuse   . Hypertension     Patient Active Problem List   Diagnosis Date Noted  . DJD (degenerative joint disease) of cervical spine 03/16/2017  . BP check 03/13/2017  . 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 Dizziness 01/23/2017  . Back pain 11/27/2016  . Bilateral posterior neck pain 11/27/2016  . Epigastric abdominal pain 11/21/2016  . T wave inversion in EKG 11/21/2016  . Nausea 11/21/2016  . Anxiety 11/21/2016    Past Surgical History:  Procedure Laterality Date  . EYE SURGERY Right ~ 1980   "hit my eye"       Home Medications    Prior to Admission medications   Medication Sig Start Date End Date Taking? Authorizing Provider  amLODipine (NORVASC) 5 MG tablet Take 1 tablet (5 mg total) by mouth daily. 02/21/17   Anders Simmonds, PA-C  escitalopram (LEXAPRO) 10 MG tablet Take 1 tablet (10 mg total) by mouth daily. 02/09/17   Marcine Matar, MD  hydrochlorothiazide (HYDRODIURIL) 12.5 MG tablet Take 1 tablet (12.5 mg total) by mouth daily. 02/25/17   Elson Areas, PA-C  hydrOXYzine (ATARAX/VISTARIL) 50 MG tablet Take 1 tablet (50 mg total) by  mouth every 6 (six) hours as needed for anxiety. 03/07/17   Quentin Angst, MD  metoCLOPramide (REGLAN) 10 MG tablet Take 1 tablet (10 mg total) by mouth every 8 (eight) hours as needed for nausea. Patient not taking: Reported on 03/02/2017 01/14/17 03/02/17  Michela Pitcher A, PA-C  omeprazole (PRILOSEC) 20 MG capsule Take 1 capsule (20 mg total) by mouth daily. Patient not taking: Reported on 03/02/2017 02/03/17   Mackuen, Cindee Salt, MD  Vitamin D, Ergocalciferol, (DRISDOL) 50000 units CAPS capsule Take 1 capsule (50,000 Units total) by mouth every 7 (seven) days. Patient not taking: Reported on 03/02/2017 02/09/17   Anders Simmonds, PA-C    Family History Family History  Problem Relation Age of Onset  . Hypertension Mother   . Hypertension Father     Social History Social History  Substance Use Topics  . Smoking status: Never Smoker  . Smokeless tobacco: Never Used  . Alcohol use No     Comment: 11/21/2016 "last drank 06/2016"; denies      Allergies   Motrin [ibuprofen]; Other; and Zofran [ondansetron hcl]   Review of Systems Review of Systems 10 Systems reviewed and are negative for acute change except as noted in the HPI.   Physical Exam Updated Vital Signs BP 114/82   Pulse 73   Temp 97.9 F (36.6 C) (Oral)   Resp  17   Ht 5\' 6"  (1.676 m)   Wt 83.5 kg (184 lb)   SpO2 100%   BMI 29.70 kg/m   Physical Exam  Constitutional: He appears well-developed and well-nourished.  HENT:  Head: Normocephalic and atraumatic.  Eyes: Conjunctivae are normal.  Neck: Neck supple.  Cardiovascular: Normal rate, regular rhythm, normal heart sounds and intact distal pulses.   No murmur heard. Pulmonary/Chest: Effort normal and breath sounds normal. No respiratory distress. He exhibits no tenderness.  Abdominal: Soft. There is no tenderness.  Musculoskeletal: He exhibits no edema or tenderness.  Neurological: He is alert.  Skin: Skin is warm and dry.  Psychiatric: He has a  normal mood and affect.  Nursing note and vitals reviewed.    ED Treatments / Results  Labs (all labs ordered are listed, but only abnormal results are displayed) Labs Reviewed  BASIC METABOLIC PANEL - Abnormal; Notable for the following:       Result Value   Sodium 133 (*)    Chloride 96 (*)    Glucose, Bld 103 (*)    All other components within normal limits  CBC - Abnormal; Notable for the following:    WBC 11.7 (*)    All other components within normal limits  I-STAT TROPOININ, ED    EKG  EKG Interpretation  Date/Time:  Friday March 16 2017 19:14:46 EDT Ventricular Rate:  95 PR Interval:  130 QRS Duration: 100 QT Interval:  348 QTC Calculation: 437 R Axis:   -55 Text Interpretation:  Normal sinus rhythm Left axis deviation Abnormal ECG no sig interval chabge from previous Confirmed by Arby BarrettePfeiffer, Marcy [phone]([id number]) on 03/16/2017 8:23:05 PM       Radiology Dg Chest 2 View  Result Date: 03/16/2017 CLINICAL DATA:  Right upper chest pain adjacent to the right axilla since 11 a.m. today. Hypertension. EXAM: CHEST  2 VIEW COMPARISON:  03/02/2017 FINDINGS: Top normal size cardiac silhouette. Low lung volumes without pneumonic consolidation, effusion or CHF. No aortic aneurysm. No acute nor suspicious osseous lesions. IMPRESSION: No active cardiopulmonary disease. Electronically Signed   By: Tollie Ethavid  Kwon M.D.   On: 03/16/2017 20:16    Procedures Procedures (including critical care time)  Medications Ordered in ED Medications - No data to display   Initial Impression / Assessment and Plan / ED Course  I have reviewed the triage vital signs and the nursing notes.  Pertinent labs & imaging results that were available during my care of the patient were reviewed by me and considered in my medical decision making (see chart for details).     Final Clinical Impressions(s) / ED Diagnoses   Final diagnoses:  Chest pain, unspecified type  Patient presents with right-sided chest pain  without associated symptoms earlier today. Now resolved. EKG does not show ischemic pattern. Troponin is negative. Patient has no prior history of cardiac ischemic disease. At this time do feel he is safe to follow-up with his PCP. Return precautions reviewed.  New Prescriptions New Prescriptions   No medications on file     Arby BarrettePfeiffer, Marcy, MD 03/16/17 2059

## 2017-03-16 NOTE — ED Notes (Signed)
Patient left at this time with all belongings. 

## 2017-03-16 NOTE — ED Triage Notes (Signed)
Pt presents with R sided upper chest pain, non radiating onset 11am today. Pt denies cough, denies fever, denies n/v/d, denies shob.  Pt states he checked his BP at home and found it to be high. Pt states he is compliant with meds.

## 2017-03-16 NOTE — Telephone Encounter (Signed)
Call placed and spoke with patient. He informed me that went to a local pharmacy to have his bp taken. He felt that his bp was high (155) and therefore took his medication.  Encouraged patient to come to the office on Monday 7/9 to have his bp checked. Pt understood and had no further questions.

## 2017-03-18 ENCOUNTER — Emergency Department (HOSPITAL_COMMUNITY)
Admission: EM | Admit: 2017-03-18 | Discharge: 2017-03-18 | Disposition: A | Discharge status: Home / Self Care | Payer: Self-pay | Attending: Emergency Medicine | Admitting: Emergency Medicine

## 2017-03-18 ENCOUNTER — Encounter (HOSPITAL_COMMUNITY): Payer: Self-pay | Admitting: Emergency Medicine

## 2017-03-18 ENCOUNTER — Encounter (HOSPITAL_COMMUNITY): Payer: Self-pay

## 2017-03-18 DIAGNOSIS — Z7902 Long term (current) use of antithrombotics/antiplatelets: Secondary | ICD-10-CM | POA: Insufficient documentation

## 2017-03-18 DIAGNOSIS — Z5321 Procedure and treatment not carried out due to patient leaving prior to being seen by health care provider: Secondary | ICD-10-CM | POA: Insufficient documentation

## 2017-03-18 DIAGNOSIS — M79604 Pain in right leg: Secondary | ICD-10-CM | POA: Insufficient documentation

## 2017-03-18 DIAGNOSIS — I10 Essential (primary) hypertension: Secondary | ICD-10-CM | POA: Insufficient documentation

## 2017-03-18 DIAGNOSIS — Z79899 Other long term (current) drug therapy: Secondary | ICD-10-CM | POA: Insufficient documentation

## 2017-03-18 DIAGNOSIS — M79605 Pain in left leg: Secondary | ICD-10-CM | POA: Insufficient documentation

## 2017-03-18 DIAGNOSIS — R51 Headache: Secondary | ICD-10-CM | POA: Insufficient documentation

## 2017-03-18 DIAGNOSIS — M542 Cervicalgia: Secondary | ICD-10-CM | POA: Insufficient documentation

## 2017-03-18 DIAGNOSIS — F419 Anxiety disorder, unspecified: Secondary | ICD-10-CM | POA: Insufficient documentation

## 2017-03-18 MED ORDER — LORAZEPAM 1 MG PO TABS
1.0000 mg | ORAL_TABLET | Freq: Once | ORAL | Status: AC
  Administered 2017-03-18: 1 mg via ORAL
  Filled 2017-03-18: qty 1

## 2017-03-18 MED ORDER — LORAZEPAM 1 MG PO TABS: 1.0000 mg | ORAL_TABLET | Freq: Three times a day (TID) | ORAL | 0 refills | Status: DC | PRN

## 2017-03-18 NOTE — ED Triage Notes (Signed)
Patient complains of neck and leg pain that is preventing him from sleeping. Denies trauma, NAD.

## 2017-03-18 NOTE — ED Notes (Addendum)
Spanish speaking male, c/o "anxiety", has Rx for Hydroxizine in his pocket from PCP. C/O neck and leg pain. Hx of HTN, anxiety and substance abuse.

## 2017-03-18 NOTE — ED Notes (Signed)
Pt asked RN to recheck his BP, states "I feel like my heart is racing". HR 88

## 2017-03-18 NOTE — ED Provider Notes (Signed)
[facility]EM[facility] DEPT Provider Note   CSN: [id number] Arrival date & time: 03/18/17  4098  By signing my name below, I, Wesley [name], attest that this documentation has been prepared under the direction and in the presence of .[place] Russo, PA-C.  Electronically Signed: Cynda [name], Scribe. 03/18/17. 9:57 AM.  History   Chief Complaint Chief Complaint  Patient presents with  . neck pain/ leg pain    HPI Comments: Wesley [name] is a 51 y.o. male with a history of polysubstance abuse (alcohol and cocaine) and anxiety, who presents to the Emergency Department, accompanied by a friend, complaining of chronic neck pain, which acutely worsened several months ago. Patient states he has been having neck pain, which is keeping him from being able to sleep. Patient does have a history of anxiety, for which he takes hydroxyzine, which states is not providing him with relief. He reports speaking with his provider about this issue, who advised set up an appointment with [facility]. He is scheduled for an appointment with his primary physician tomorrow or tuesday. Patient denies any SI, HI, auditory hallucinations, or visual hallucinations.  Per chart review, patient has been seen 4 times this month for the same problem, he has had a total of 22 visits in June for similar symptoms. Patient was noted to have had two appointments scheduled for [facility], but he did not show up for the first one and he walked out on the second one.  Patient reports associated bilateral posterior leg pain upon ambulation and intermittent nausea. Patient reports taking lorazepam with some relief and states this is the only medication that works.  Patient denies fever, chills, numbness, tingling, bowel/bladder incontinence or weakness.    The history is provided by the patient. No language interpreter was used.    Past Medical History:  Diagnosis Date  . Alcohol abuse   . Anxiety   . Chronic neck pain   . Cocaine abuse   .  Hypertension     Patient Active Problem List   Diagnosis Date Noted  . DJD (degenerative joint disease) of cervical spine 03/16/2017  . BP check 03/13/2017  . 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 Dizziness 01/23/2017  . Back pain 11/27/2016  . Bilateral posterior neck pain 11/27/2016  . Epigastric abdominal pain 11/21/2016  . T wave inversion in EKG 11/21/2016  . Nausea 11/21/2016  . Anxiety 11/21/2016    Past Surgical History:  Procedure Laterality Date  . EYE SURGERY Right ~ 1980   "hit my eye"       Home Medications    Prior to Admission medications   Medication Sig Start Date End Date Taking? Authorizing Provider  amLODipine (NORVASC) 5 MG tablet Take 1 tablet (5 mg total) by mouth daily. 02/21/17   Anders Simmonds, PA-C  escitalopram (LEXAPRO) 10 MG tablet Take 1 tablet (10 mg total) by mouth daily. 02/09/17   Marcine Matar, MD  hydrochlorothiazide (HYDRODIURIL) 12.5 MG tablet Take 1 tablet (12.5 mg total) by mouth daily. 02/25/17   Elson Areas, PA-C  hydrOXYzine (ATARAX/VISTARIL) 50 MG tablet Take 1 tablet (50 mg total) by mouth every 6 (six) hours as needed for anxiety. 03/07/17   Quentin Angst, MD  LORazepam (ATIVAN) 1 MG tablet Take 1 tablet (1 mg total) by mouth every 8 (eight) hours as needed for anxiety. 03/18/17   Russo, [place] N, PA-C  metoCLOPramide (REGLAN) 10 MG tablet Take 1 tablet (10 mg total) by mouth every 8 (eight) hours as needed for nausea.  Patient not taking: Reported on 03/02/2017 01/14/17 03/02/17  Michela Pitcher A, PA-C  omeprazole (PRILOSEC) 20 MG capsule Take 1 capsule (20 mg total) by mouth daily. Patient not taking: Reported on 03/02/2017 02/03/17   Mackuen, Cindee Salt, MD  Vitamin D, Ergocalciferol, (DRISDOL) 50000 units CAPS capsule Take 1 capsule (50,000 Units total) by mouth every 7 (seven) days. Patient not taking: Reported on 03/02/2017 02/09/17   Anders Simmonds, PA-C    Family History Family History  Problem Relation Age of Onset  .  Hypertension Mother   . Hypertension Father     Social History Social History  Substance Use Topics  . Smoking status: Never Smoker  . Smokeless tobacco: Never Used  . Alcohol use No     Comment: 11/21/2016 "last drank 06/2016"; denies      Allergies   Motrin [ibuprofen]; Other; and Zofran [ondansetron hcl]   Review of Systems Review of Systems  Constitutional: Negative for chills and fever.  Musculoskeletal: Positive for arthralgias (bilateral legs) and neck pain. Negative for gait problem.  Neurological: Negative for weakness and numbness.     Physical Exam Updated Vital Signs BP (!) 145/101 (BP Location: Left Arm)   Pulse 88   Temp 98 F (36.7 C) (Oral)   Resp 18   SpO2 100%   Physical Exam  Constitutional: He is oriented to person, place, and time. He appears well-developed and well-nourished.  HENT:  Head: Normocephalic and atraumatic.  Eyes: Conjunctivae are normal.  Neck: Normal range of motion. Neck supple.  Normal range of motion of the neck.   Cardiovascular: Normal rate and regular rhythm.  Exam reveals no gallop and no friction rub.   No murmur heard. Pulmonary/Chest: Effort normal and breath sounds normal. He has no wheezes. He has no rales.  Musculoskeletal: Normal range of motion. He exhibits tenderness. He exhibits no edema or deformity.  No spinal or paraspinal tenderness. No bony step offs or gross deformity. Moves all extremities. No tenderness to bilateral lower extremities.   Lymphadenopathy:    He has no cervical adenopathy.  Neurological: He is alert and oriented to person, place, and time. He displays normal reflexes. No cranial nerve deficit or sensory deficit.  5/5 strength bilateral upper and lower extremities. Normal sensation. Normal gait. Cranial nerves are grossly intact.   Psychiatric: His speech is normal and behavior is normal. His mood appears anxious. He is not actively hallucinating. He expresses no homicidal and no suicidal  ideation. He expresses no suicidal plans and no homicidal plans.  Nursing note and vitals reviewed.    ED Treatments / Results  DIAGNOSTIC STUDIES: Oxygen Saturation is 100% on RA, normal by my interpretation.    COORDINATION OF CARE: 9:57 AM Discussed treatment plan with pt at bedside and pt agreed to plan, which includes lorazepam.   Labs (all labs ordered are listed, but only abnormal results are displayed) Labs Reviewed - No data to display  EKG  EKG Interpretation None       Radiology Dg Chest 2 View  Result Date: 03/16/2017 CLINICAL DATA:  Right upper chest pain adjacent to the right axilla since 11 a.m. today. Hypertension. EXAM: CHEST  2 VIEW COMPARISON:  03/02/2017 FINDINGS: Top normal size cardiac silhouette. Low lung volumes without pneumonic consolidation, effusion or CHF. No aortic aneurysm. No acute nor suspicious osseous lesions. IMPRESSION: No active cardiopulmonary disease. Electronically Signed   By: Tollie Eth M.D.   On: 03/16/2017 20:16    Procedures Procedures (including critical  care time)  Medications Ordered in ED Medications  LORazepam (ATIVAN) tablet 1 mg (1 mg Oral Given 03/18/17 1005)     Initial Impression / Assessment and Plan / ED Course  I have reviewed the triage vital signs and the nursing notes.  Pertinent labs & imaging results that were available during my care of the patient were reviewed by me and considered in my medical decision making (see chart for details).     Pt presenting with chief complaints of neck and leg pain. After lots of discussion, it appears pt's main complaint today is anxiety. Pt without SI/HI or hallucinations. Pt has been noncompliant with outpt follow-up or taking his HTN medications. Pt was offered inpatient admission for assistance with ongoing symptoms, as this will be his 4th visit this month, but pt declined.  Regarding pt's complaints of neck and leg pain; normal neurologic exam. Nl ROM of all extremities.  No injuries warranting imaging.  Ativan given in ED for anxiety. Rx for 4 tabs of ativan given. Had in-depth discussion with pt regarding following up on outpatient basis. Pt verbalizes he would attend his appointment this week. Pt is safe for discharge.  Discussed results, findings, treatment and follow up. Patient advised of return precautions. Patient verbalized understanding and agreed with plan.  Final Clinical Impressions(s) / ED Diagnoses   Final diagnoses:  Neck pain  Anxiety    New Prescriptions Discharge Medication List as of 03/18/2017 10:22 AM    START taking these medications   Details  LORazepam (ATIVAN) 1 MG tablet Take 1 tablet (1 mg total) by mouth every 8 (eight) hours as needed for anxiety., Starting Sun 03/18/2017, Print       I personally performed the services described in this documentation, which was scribed in my presence. The recorded information has been reviewed and is accurate.     Russo, [place]il[place] N, PA-C 03/18/17 1816    Benjiman CorePickering, Nathan, MD 03/19/17 [id number]

## 2017-03-18 NOTE — ED Triage Notes (Signed)
Patient woke up this morning with anxiety attack  " my /body shaking" and mild headache , respirations unlabored .

## 2017-03-18 NOTE — ED Notes (Signed)
Interpretor machine to bedside.

## 2017-03-18 NOTE — ED Triage Notes (Signed)
PT  Not in front lobby . Pt called x2.

## 2017-03-18 NOTE — Discharge Instructions (Signed)
Schedule an appointment/attend your appointment this week for follow up on your symptoms. You can take advil every 6 hours as needed for pain. You can take lorazepam every 8 hours as needed for anxiety. Return to the ER for new or concerning symptoms.

## 2017-03-19 ENCOUNTER — Ambulatory Visit: Payer: Self-pay | Attending: Internal Medicine | Admitting: Pharmacist

## 2017-03-19 ENCOUNTER — Telehealth: Payer: Self-pay | Admitting: Internal Medicine

## 2017-03-19 ENCOUNTER — Telehealth: Payer: Self-pay

## 2017-03-19 VITALS — BP 128/85 | HR 99

## 2017-03-19 DIAGNOSIS — Z0130 Encounter for examination of blood pressure without abnormal findings: Secondary | ICD-10-CM | POA: Insufficient documentation

## 2017-03-19 DIAGNOSIS — I10 Essential (primary) hypertension: Secondary | ICD-10-CM

## 2017-03-19 NOTE — Telephone Encounter (Signed)
[facility]'s [facility] today. Spoke to SundanceJoyce, in the call center, and she confirmed that the patient had a complete clinical assessment ( CCA) done today and he has a follow up appointment for medication management in 04/2017. She was not able to provide any information if any new medications were prescribed.

## 2017-03-19 NOTE — Progress Notes (Signed)
   S:    Patient arrives in good spirits with his friend.    Presents to the clinic for hypertension evaluation.   Patient reports adherence with medications.  Current BP Medications include:  Amlodipine 5 mg daily, HCTZ 12.5 mg daily  O:   Last 3 Office BP readings: BP Readings from Last 3 Encounters:  03/19/17 128/85  03/18/17 (!) 145/101  03/18/17 (!) 124/92    BMET    Component Value Date/Time   NA 133 (L) 03/16/2017 1922   K 3.5 03/16/2017 1922   CL 96 (L) 03/16/2017 1922   CO2 28 03/16/2017 1922   GLUCOSE 103 (H) 03/16/2017 1922   BUN 8 03/16/2017 1922   CREATININE 0.73 03/16/2017 1922   CALCIUM 9.5 03/16/2017 1922   GFRNONAA >60 03/16/2017 1922   GFRAA >60 03/16/2017 1922    A/P: Hypertension longstanding currently controlled on current medications.  Continued current medications as prescribed.   Results reviewed and written information provided.   Total time in face-to-face counseling 5 minutes.   F/U Clinic Visit with Dr. Laural BenesJohnson as directed

## 2017-03-19 NOTE — Patient Instructions (Signed)
Thank you for coming to see me  Your blood pressure looks great!  Keep taking your medications as prescribed  Follow up with Dr. Laural BenesJohnson

## 2017-03-19 NOTE — Telephone Encounter (Signed)
Met with patient today while he was in the clinic to have his bp checked. Patient was once again accompanied by his male friend, Earney Mallet.  Patient informed me that he went to [facility] this morning and filled out documents. He was asked to return tomorrow to see a doctor. Pt stated that he felt "comfortable" going there and will go tomorrow as well. Pt also mentioned that he was prescribed Lorazepam in the ED( on Sunday) and took it this afternoon. He said that it helped with both his bp and nerve pain around his neck and back. Reminded patient of the importance of taking medication as directed and not when he is feeling anxious. Pt understood.   Also reminded patient of the reasons why he should be going to ED (medical emergencies only) and not to have his bp checked or when he has questions about his medication, etc. Pt understood.

## 2017-03-20 ENCOUNTER — Ambulatory Visit: Payer: Self-pay | Attending: Internal Medicine | Admitting: *Deleted

## 2017-03-20 VITALS — BP 124/87 | HR 83 | Resp 16

## 2017-03-20 DIAGNOSIS — Z0130 Encounter for examination of blood pressure without abnormal findings: Secondary | ICD-10-CM

## 2017-03-20 DIAGNOSIS — I10 Essential (primary) hypertension: Secondary | ICD-10-CM | POA: Insufficient documentation

## 2017-03-20 DIAGNOSIS — Z048 Encounter for examination and observation for other specified reasons: Secondary | ICD-10-CM | POA: Insufficient documentation

## 2017-03-20 NOTE — Progress Notes (Signed)
[facility], alert and oriented and arrives in good spirits.  Pt denies chest pain, SOB, HA, dizziness, or blurred vision.  Verified medication. Pt states medication was taken this morning.  Blood pressure reading: 124/87 HR: 83

## 2017-03-21 ENCOUNTER — Telehealth: Payer: Self-pay | Admitting: Internal Medicine

## 2017-03-21 ENCOUNTER — Ambulatory Visit: Payer: Self-pay | Attending: Internal Medicine | Admitting: *Deleted

## 2017-03-21 VITALS — BP 125/86 | HR 86

## 2017-03-21 DIAGNOSIS — Z0130 Encounter for examination of blood pressure without abnormal findings: Secondary | ICD-10-CM | POA: Insufficient documentation

## 2017-03-21 MED ORDER — OMEPRAZOLE 20 MG PO CPDR: 20.0000 mg | DELAYED_RELEASE_CAPSULE | Freq: Every day | ORAL | 0 refills | Status: DC

## 2017-03-21 NOTE — Progress Notes (Signed)
[facility]. Pt alert and oriented and arrives in good spirits.   Pt denies chest pain, SOB, HA, dizziness, or blurred vision.  Verified medication. Pt states he took Lorazepam this morning .Took lorazepam this morning and feels nauseated. He was given crackers and ginger ale.   Blood pressure reading: 125/86   Encouraged to take omeprazole daily.  [facility], brings in new prescriptions from visit there. Wrote on medication bottle what medications are for and provided a daily schedule he could take medications.   Pt encouraged to come to office for any questions.

## 2017-03-21 NOTE — Telephone Encounter (Signed)
Met with patient while he was in the office to have his bp checked by nurse. Pt informed me that he went to [facility] yesterday and was given new medications. Pt stated that he will be going again next week to have a face-to-face appointment with a doctor.   Patient informed Carilyn Goodpasture Investment banker, corporate) and myself that he was feeling nausea after taking Lorazepam. When asked if he had eaten prior taking medication, he said "no". A snack was given to him.  A medication tracker was created (in [place]) and given to patient in order to have patient take his medication as directed.

## 2017-03-22 ENCOUNTER — Ambulatory Visit: Payer: Self-pay | Attending: Internal Medicine | Admitting: *Deleted

## 2017-03-22 ENCOUNTER — Emergency Department (HOSPITAL_COMMUNITY): Payer: Self-pay

## 2017-03-22 ENCOUNTER — Emergency Department (HOSPITAL_COMMUNITY)
Admission: EM | Admit: 2017-03-22 | Discharge: 2017-03-22 | Disposition: A | Discharge status: Home / Self Care | Payer: Self-pay | Attending: Emergency Medicine | Admitting: Emergency Medicine

## 2017-03-22 ENCOUNTER — Encounter (HOSPITAL_COMMUNITY): Payer: Self-pay | Admitting: Emergency Medicine

## 2017-03-22 DIAGNOSIS — Z0130 Encounter for examination of blood pressure without abnormal findings: Secondary | ICD-10-CM

## 2017-03-22 DIAGNOSIS — F419 Anxiety disorder, unspecified: Secondary | ICD-10-CM | POA: Insufficient documentation

## 2017-03-22 DIAGNOSIS — Z79899 Other long term (current) drug therapy: Secondary | ICD-10-CM | POA: Insufficient documentation

## 2017-03-22 DIAGNOSIS — M542 Cervicalgia: Secondary | ICD-10-CM | POA: Insufficient documentation

## 2017-03-22 DIAGNOSIS — I10 Essential (primary) hypertension: Secondary | ICD-10-CM | POA: Insufficient documentation

## 2017-03-22 DIAGNOSIS — M1990 Unspecified osteoarthritis, unspecified site: Secondary | ICD-10-CM | POA: Insufficient documentation

## 2017-03-22 LAB — I-STAT TROPONIN, ED: Troponin i, poc: 0.01 ng/mL (ref 0.00–0.08)

## 2017-03-22 LAB — BASIC METABOLIC PANEL
Anion gap: 8 (ref 5–15)
BUN: 7 mg/dL (ref 6–20)
CO2: 27 mmol/L (ref 22–32)
Calcium: 9.1 mg/dL (ref 8.9–10.3)
Chloride: 99 mmol/L — ABNORMAL LOW (ref 101–111)
Creatinine, Ser: 0.76 mg/dL (ref 0.61–1.24)
GFR calc Af Amer: >60 mL/min (ref >60)
GFR calc non Af Amer: >60 mL/min (ref >60)
Glucose, Bld: 103 mg/dL — ABNORMAL HIGH (ref 65–99)
Potassium: 3.9 mmol/L (ref 3.5–5.1)
Sodium: 134 mmol/L — ABNORMAL LOW (ref 135–145)

## 2017-03-22 LAB — CBC
HCT: 43.5 % (ref 39.0–52.0)
Hemoglobin: 13.7 g/dL (ref 13.0–17.0)
MCH: 26.9 pg (ref 26.0–34.0)
MCHC: 31.5 g/dL (ref 30.0–36.0)
MCV: 85.3 fL (ref 78.0–100.0)
Platelets: 247 10*3/uL (ref 150–400)
RBC: 5.1 MIL/uL (ref 4.22–5.81)
RDW: 14 % (ref 11.5–15.5)
WBC: 6 10*3/uL (ref 4.0–10.5)

## 2017-03-22 MED ORDER — NAPROXEN 250 MG PO TABS
500.0000 mg | ORAL_TABLET | Freq: Once | ORAL | Status: AC
  Administered 2017-03-22: 500 mg via ORAL
  Filled 2017-03-22: qty 2

## 2017-03-22 MED ORDER — MELOXICAM 15 MG PO TABS: 15.0000 mg | ORAL_TABLET | Freq: Every day | ORAL | 0 refills | Status: DC

## 2017-03-22 MED ORDER — VITAMIN D (ERGOCALCIFEROL) 1.25 MG (50000 UNIT) PO CAPS: 50000.0000 [IU] | ORAL_CAPSULE | ORAL | 0 refills | Status: DC

## 2017-03-22 MED FILL — VIT D2 1.25 MG (50,000 UNIT: 1.25 MG | 84 days supply | Qty: 12 | Fill #0

## 2017-03-22 MED FILL — ?OMEPRAZOLE DR 20 MG CAPSUL: 20 | 30 days supply | Qty: 30 | Fill #0

## 2017-03-22 NOTE — Progress Notes (Signed)
Patient Triage Assessment Form Todays Date: Name: Wesley LitterJose Buerkle DOB: [birth year] Reason for walkin: question about medications When did your symptoms start: n/a Please list symptoms: n/a Are you having pain: yes: Neck pain  Assessment Pt presents with question r/t medications. He wanted to know if he was able to take medication he received last night from the ED (Mobic) with other medications that he has.   Pt aware how to take medication. Went over schedule that was made for with him on yesterday. Pt verbalized understanding.  Pt stated his anxiety is not better. Explained to patient that he needed to take medication for symtoms to help. Pt verbalized understanding.    Vital Signs:  P: 83  R: 16  SpO2: 99  BP: 125/88  Plan Encouraged to f/u at office for any questions.

## 2017-03-22 NOTE — ED Triage Notes (Signed)
Pt reports a "racing heart" this morning and neck/back pain.  Pt denies SOB, n/v/d, weakness and dizziness.

## 2017-03-22 NOTE — ED Provider Notes (Signed)
[facility]PT Provider Note   CSN: [id number] Arrival date & time: 03/22/17  0530     History   Chief Complaint Chief Complaint  Patient presents with  . Tachycardia  . Neck Pain    HPI Wesley [name] is a 51 y.o. male. Chief complaint is neck pain and palpitations  HPI 51 year old male. Multiple visits over the last several weeks for similar complaints. Has chronic neck pain. Has arthritis via CT in June of this year. Document of anxiety. Normal recent EKGs. [facility] Started on hydroxyzine, and it sounds like trazodone for sleep. He waking again this morning with neck pain followed by palpitations and anxiety. He has no numbness to his extremities. No difficulty with walking or strength. No chest pain or shortness of breath. He feels like he is sleeping better with sleep medicine. He just started on "something else for anxiety" yesterday that was prescribed [facility].  History of drug abuse. He adamantly denies cocaine and amphetamines today.  Past Medical History:  Diagnosis Date  . Alcohol abuse   . Anxiety   . Chronic neck pain   . Cocaine abuse   . Hypertension     Patient Active Problem List   Diagnosis Date Noted  . DJD (degenerative joint disease) of cervical spine 03/16/2017  . BP check 03/13/2017  . 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 Dizziness 01/23/2017  . Back pain 11/27/2016  . Bilateral posterior neck pain 11/27/2016  . Epigastric abdominal pain 11/21/2016  . T wave inversion in EKG 11/21/2016  . Nausea 11/21/2016  . Anxiety 11/21/2016    Past Surgical History:  Procedure Laterality Date  . EYE SURGERY Right ~ 1980   "hit my eye"       Home Medications    Prior to Admission medications   Medication Sig Start Date End Date Taking? Authorizing Provider  amLODipine (NORVASC) 5 MG tablet Take 1 tablet (5 mg total) by mouth daily. 02/21/17   Anders SimmondsMcClung, Angela M, PA-C  FLUoxetine (PROZAC) 10 MG capsule Take 10 mg by mouth daily.    [provider]  hydrochlorothiazide (HYDRODIURIL) 12.5 MG tablet Take 1 tablet (12.5 mg total) by mouth daily. 02/25/17   Elson AreasSofia, Leslie K, PA-C  hydrOXYzine (VISTARIL) 50 MG capsule Take 50 mg by mouth 3 (three) times daily as needed.    [provider]  LORazepam (ATIVAN) 1 MG tablet Take 1 tablet (1 mg total) by mouth every 8 (eight) hours as needed for anxiety. 03/18/17   Russo, [place]Jordan N, PA-C  meloxicam (MOBIC) 15 MG tablet Take 1 tablet (15 mg total) by mouth daily. 03/22/17   Rolland PorterJames, Mark, MD  metoCLOPramide (REGLAN) 10 MG tablet Take 1 tablet (10 mg total) by mouth every 8 (eight) hours as needed for nausea. Patient not taking: Reported on 03/02/2017 01/14/17 03/02/17  Michela PitcherFawze, Mina A, PA-C  omeprazole (PRILOSEC) 20 MG capsule Take 1 capsule (20 mg total) by mouth daily. 03/21/17   Marcine MatarJohnson, Deborah B, MD  traZODone (DESYREL) 50 MG tablet Take 50 mg by mouth at bedtime.    [provider]  Vitamin D, Ergocalciferol, (DRISDOL) 50000 units CAPS capsule Take 1 capsule (50,000 Units total) by mouth every 7 (seven) days. Patient not taking: Reported on 03/02/2017 02/09/17   Anders SimmondsMcClung, Angela M, PA-C    Family History Family History  Problem Relation Age of Onset  . Hypertension Mother   . Hypertension Father     Social History Social History  Substance Use Topics  .  Smoking status: Never Smoker  . Smokeless tobacco: Never Used  . Alcohol use No     Comment: 11/21/2016 "last drank 06/2016"; denies      Allergies   Motrin [ibuprofen]; Other; and Zofran [ondansetron hcl]   Review of Systems Review of Systems  Constitutional: Negative for appetite change, chills, diaphoresis, fatigue and fever.  HENT: Negative for mouth sores, sore throat and trouble swallowing.   Eyes: Negative for visual disturbance.  Respiratory: Negative for cough, chest tightness, shortness of breath and wheezing.   Cardiovascular: Positive for palpitations. Negative for chest pain.  Gastrointestinal:  Negative for abdominal distention, abdominal pain, diarrhea, nausea and vomiting.  Endocrine: Negative for polydipsia, polyphagia and polyuria.  Genitourinary: Negative for dysuria, frequency and hematuria.  Musculoskeletal: Positive for neck pain. Negative for gait problem.  Skin: Negative for color change, pallor and rash.  Neurological: Negative for dizziness, syncope, light-headedness and headaches.  Hematological: Does not bruise/bleed easily.  Psychiatric/Behavioral: Negative for behavioral problems and confusion.     Physical Exam Updated Vital Signs BP (!) 143/98 (BP Location: Right Arm)   Pulse 98   Temp 98.2 F (36.8 C) (Oral)   Ht 5\' 6"  (1.676 m)   Wt 83.5 kg (184 lb)   SpO2 98%   BMI 29.70 kg/m   Physical Exam  Constitutional: He is oriented to person, place, and time. He appears well-developed and well-nourished. No distress.  HENT:  Head: Normocephalic.  No thyromegaly  Eyes: Pupils are equal, round, and reactive to light. Conjunctivae are normal. No scleral icterus.  Neck: Normal range of motion. Neck supple. No thyromegaly present.  Cardiovascular: Normal rate and regular rhythm.  Exam reveals no gallop and no friction rub.   No murmur heard. Pulmonary/Chest: Effort normal and breath sounds normal. No respiratory distress. He has no wheezes. He has no rales.  Abdominal: Soft. Bowel sounds are normal. He exhibits no distension. There is no tenderness. There is no rebound.  Musculoskeletal: Normal range of motion.  Neurological: He is alert and oriented to person, place, and time.  Normal symmetric Strength to shoulder shrug, triceps, biceps, grip,wrist flex/extend,and intrinsics  Norma lsymmetric sensation above and below clavicles, and to all distributions to UEs. Norma symmetric strength to flex/.extend hip and knees, dorsi/plantar flex ankles. Normal symmetric sensation to all distributions to LEs Patellar and achilles reflexes 1-2+. Downgoing Babinski     Skin: Skin is warm and dry. No rash noted.  Psychiatric: He has a normal mood and affect. His behavior is normal.     ED Treatments / Results  Labs (all labs ordered are listed, but only abnormal results are displayed) Labs Reviewed  BASIC METABOLIC PANEL - Abnormal; Notable for the following:       Result Value   Sodium 134 (*)    Chloride 99 (*)    Glucose, Bld 103 (*)    All other components within normal limits  CBC  I-STAT TROPOININ, ED    EKG  EKG Interpretation  Date/Time:  Thursday March 22 2017 05:34:57 EDT Ventricular Rate:  92 PR Interval:  132 QRS Duration: 102 QT Interval:  350 QTC Calculation: 432 R Axis:   -49 Text Interpretation:  Normal sinus rhythm Left axis deviation Minimal voltage criteria for LVH, may be normal variant Abnormal ECG Confirmed by Rolland Porter ([id number]) on 03/22/2017 7:33:13 AM       Radiology Dg Chest 2 View  Result Date: 03/22/2017 CLINICAL DATA:  51 year old male with palpitation and shortness of breath. EXAM:  CHEST  2 VIEW COMPARISON:  Chest radiograph dated 03/16/2017 FINDINGS: Minimal left lung base atelectatic changes. No focal consolidation, pleural effusion, or pneumothorax. The cardiac silhouette is within normal limits. No acute osseous pathology. IMPRESSION: No active cardiopulmonary disease. Electronically Signed   By: Elgie Collard M.D.   On: 03/22/2017 06:10    Procedures Procedures (including critical care time)  Medications Ordered in ED Medications  naproxen (NAPROSYN) tablet 500 mg (not administered)     Initial Impression / Assessment and Plan / ED Course  I have reviewed the triage vital signs and the nursing notes.  Pertinent labs & imaging results that were available during my care of the patient were reviewed by me and considered in my medical decision making (see chart for details).     EKG normal. Labs normal. No symptoms that would suggest his neck pain is anginal. Consistent with arthritis,  anxiety, and insomnia. Continue trazodone, and hydroxyzine. We'll add Modic. He lists ibuprofen as an allergy but his side effect is "dry mouth".  Final Clinical Impressions(s) / ED Diagnoses   Final diagnoses:  Neck pain  Anxiety  Arthritis    New Prescriptions New Prescriptions   MELOXICAM (MOBIC) 15 MG TABLET    Take 1 tablet (15 mg total) by mouth daily.     Rolland Porter, MD 03/22/17 [phone]

## 2017-03-23 ENCOUNTER — Ambulatory Visit: Payer: Self-pay | Attending: Internal Medicine | Admitting: *Deleted

## 2017-03-23 ENCOUNTER — Telehealth: Payer: Self-pay

## 2017-03-23 VITALS — BP 125/88 | HR 80 | Resp 16

## 2017-03-23 DIAGNOSIS — F419 Anxiety disorder, unspecified: Secondary | ICD-10-CM | POA: Insufficient documentation

## 2017-03-23 NOTE — Telephone Encounter (Signed)
Met with the patient and Carilyn Goodpasture, [facility] Triage RN when he was in the clinic today. He had many questions about his medications and indicated that he had not been taking them as prescribed.He stated that he routinely wakes up at 0300 and then can't fall back to sleep.   Helped him fill a medication box for one week to assess if that will help with compliance.  He will plan to return to [facility] on 03/26/17 for BP check and medication review.

## 2017-03-23 NOTE — Progress Notes (Signed)
Patient Triage Assessment Form Todays Date: Name: Wesley [name] DOB: Nov 08, 1965 Reason for walkin: Medication question When did your symptoms start: n/a Please list symptoms: Are you having pain: yes  Assessment: Pt arrives with concerns about his medication. He states does not know how to take medication. He was assisted with putting medication in pill box provided by Case Manager, J.Brazeau, RN. Pt states he is unable to sleep at night, he still feels anxious, has pain in his neck, tingling in arms and abdominal discomfort however,  he has not taken medication he has been prescribed. He only takes his blood pressure medication.  Reiterated to patient that his symptoms will continue and may progress if he does not act upon them.   Vital Signs:  P: 80  R: 16  SpO2: 99 BP: 125/88  Plan May come to [facility] on Monday to address questions or concerns.

## 2017-03-24 ENCOUNTER — Encounter (HOSPITAL_COMMUNITY): Payer: Self-pay | Admitting: Emergency Medicine

## 2017-03-24 ENCOUNTER — Emergency Department (HOSPITAL_COMMUNITY)
Admission: EM | Admit: 2017-03-24 | Discharge: 2017-03-24 | Disposition: A | Discharge status: Home / Self Care | Payer: Self-pay | Attending: Emergency Medicine | Admitting: Emergency Medicine

## 2017-03-24 DIAGNOSIS — I10 Essential (primary) hypertension: Secondary | ICD-10-CM | POA: Insufficient documentation

## 2017-03-24 DIAGNOSIS — Z79899 Other long term (current) drug therapy: Secondary | ICD-10-CM | POA: Insufficient documentation

## 2017-03-24 DIAGNOSIS — F410 Panic disorder [episodic paroxysmal anxiety] without agoraphobia: Secondary | ICD-10-CM | POA: Insufficient documentation

## 2017-03-24 MED ORDER — LORAZEPAM 1 MG PO TABS: 1.0000 mg | ORAL_TABLET | Freq: Three times a day (TID) | ORAL | 0 refills | Status: DC | PRN

## 2017-03-24 MED ORDER — LORAZEPAM 1 MG PO TABS
1.0000 mg | ORAL_TABLET | Freq: Once | ORAL | Status: AC
  Administered 2017-03-24: 1 mg via ORAL
  Filled 2017-03-24: qty 1

## 2017-03-24 NOTE — ED Triage Notes (Signed)
C/o anxiety.  Pt states that he used to take Lorazepam but doctor switched him to Trazodone.  Took Hydroxyzine 1 hour ago and was unsure if he could take Trazodone now.  Unable to sleep.

## 2017-03-24 NOTE — ED Provider Notes (Signed)
[facility]-EM[facility]Y DEPT Provider Note   CSN: [id number] Arrival date & time: 03/24/17  0034     History   Chief Complaint Chief Complaint  Patient presents with  . Anxiety    HPI Wesley [name] is a 51 y.o. male.  Patient is a 51 year old male with a history of anxiety and hypertension who presents with a panic attack. He states he was recently switched from Ativan for his anxiety to Atarax and trazodone. He states over the last 2 days he hasn't been able to sleep. He feels anxious. He has the sensation that he's going to die. He feels nauseated. He denies any chest pain or shortness of breath. He has some myalgias which is not uncommon for him. He denies any suicidal or homicidal ideations. He denies any recent alcohol or drug use.      Past Medical History:  Diagnosis Date  . Alcohol abuse   . Anxiety   . Chronic neck pain   . Cocaine abuse   . Hypertension     Patient Active Problem List   Diagnosis Date Noted  . DJD (degenerative joint disease) of cervical spine 03/16/2017  . BP check 03/13/2017  . 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 Dizziness 01/23/2017  . Back pain 11/27/2016  . Bilateral posterior neck pain 11/27/2016  . Epigastric abdominal pain 11/21/2016  . T wave inversion in EKG 11/21/2016  . Nausea 11/21/2016  . Anxiety 11/21/2016    Past Surgical History:  Procedure Laterality Date  . EYE SURGERY Right ~ 1980   "hit my eye"       Home Medications    Prior to Admission medications   Medication Sig Start Date End Date Taking? Authorizing Provider  amLODipine (NORVASC) 5 MG tablet Take 1 tablet (5 mg total) by mouth daily. 02/21/17   Anders Simmonds, PA-C  FLUoxetine (PROZAC) 10 MG capsule Take 10 mg by mouth daily.    [provider]  hydrochlorothiazide (HYDRODIURIL) 12.5 MG tablet Take 1 tablet (12.5 mg total) by mouth daily. 02/25/17   Elson Areas, PA-C  hydrOXYzine (VISTARIL) 50 MG capsule Take 50 mg by mouth 3 (three) times daily as needed.     [provider]  LORazepam (ATIVAN) 1 MG tablet Take 1 tablet (1 mg total) by mouth 3 (three) times daily as needed for anxiety. 03/24/17   Rolan Bucco, MD  meloxicam (MOBIC) 15 MG tablet Take 1 tablet (15 mg total) by mouth daily. Patient not taking: Reported on 03/23/2017 03/22/17   Rolland Porter, MD  metoCLOPramide (REGLAN) 10 MG tablet Take 1 tablet (10 mg total) by mouth every 8 (eight) hours as needed for nausea. Patient not taking: Reported on 03/02/2017 01/14/17 03/02/17  Michela Pitcher A, PA-C  omeprazole (PRILOSEC) 20 MG capsule Take 1 capsule (20 mg total) by mouth daily. 03/21/17   Marcine Matar, MD  traZODone (DESYREL) 50 MG tablet Take 50 mg by mouth at bedtime.    [provider]  Vitamin D, Ergocalciferol, (DRISDOL) 50000 units CAPS capsule Take 1 capsule (50,000 Units total) by mouth every 7 (seven) days. Patient not taking: Reported on 03/23/2017 03/22/17   Marcine Matar, MD    Family History Family History  Problem Relation Age of Onset  . Hypertension Mother   . Hypertension Father     Social History Social History  Substance Use Topics  . Smoking status: Never Smoker  . Smokeless tobacco: Never Used  . Alcohol use No     Comment: 11/21/2016 "  last drank 06/2016"; denies      Allergies   Motrin [ibuprofen]; Other; and Zofran [ondansetron hcl]   Review of Systems Review of Systems  Constitutional: Negative for chills, diaphoresis, fatigue and fever.  HENT: Negative for congestion, rhinorrhea and sneezing.   Eyes: Negative.   Respiratory: Negative for cough, chest tightness and shortness of breath.   Cardiovascular: Negative for chest pain and leg swelling.  Gastrointestinal: Negative for abdominal pain, blood in stool, diarrhea, nausea and vomiting.  Genitourinary: Negative for difficulty urinating, flank pain, frequency and hematuria.  Musculoskeletal: Positive for myalgias and neck pain. Negative for arthralgias and back pain.  Skin:  Negative for rash.  Neurological: Negative for dizziness, speech difficulty, weakness, numbness and headaches.  Psychiatric/Behavioral: Positive for agitation and sleep disturbance. Negative for suicidal ideas. The patient is nervous/anxious.      Physical Exam Updated Vital Signs BP 135/90 (BP Location: Right Arm)   Pulse 81   Temp 98.2 F (36.8 C) (Oral)   Resp 20   SpO2 97%   Physical Exam  Constitutional: He is oriented to person, place, and time. He appears well-developed and well-nourished.  HENT:  Head: Normocephalic and atraumatic.  Eyes: Pupils are equal, round, and reactive to light.  Neck: Normal range of motion. Neck supple.  Cardiovascular: Normal rate, regular rhythm and normal heart sounds.   Pulmonary/Chest: Effort normal and breath sounds normal. No respiratory distress. He has no wheezes. He has no rales. He exhibits no tenderness.  Abdominal: Soft. Bowel sounds are normal. There is no tenderness. There is no rebound and no guarding.  Musculoskeletal: Normal range of motion. He exhibits no edema.  Lymphadenopathy:    He has no cervical adenopathy.  Neurological: He is alert and oriented to person, place, and time.  Skin: Skin is warm and dry. No rash noted.  Psychiatric: His mood appears anxious.     ED Treatments / Results  Labs (all labs ordered are listed, but only abnormal results are displayed) Labs Reviewed - No data to display  EKG  EKG Interpretation None       Radiology No results found.  Procedures Procedures (including critical care time)  Medications Ordered in ED Medications  LORazepam (ATIVAN) tablet 1 mg (not administered)     Initial Impression / Assessment and Plan / ED Course  I have reviewed the triage vital signs and the nursing notes.  Pertinent labs & imaging results that were available during my care of the patient were reviewed by me and considered in my medical decision making (see chart for details).      Patient appears to be very agitated. He's otherwise intact. He has mild elevation in his blood pressure. He has no neurologic deficits. No suicidal ideations. No chest pain or shortness of breath. He is requesting a short-term prescription of Ativan until he can follow-up with his PCP on Monday. I said I would give him 4 tablets for the weekend after which he can talk to his PCP about whether to continue it o or switch to something different. He was discharged home in good condition. Return precautions were given.  Final Clinical Impressions(s) / ED Diagnoses   Final diagnoses:  Panic attack    New Prescriptions New Prescriptions   LORAZEPAM (ATIVAN) 1 MG TABLET    Take 1 tablet (1 mg total) by mouth 3 (three) times daily as needed for anxiety.     Rolan BuccoBelfi, Melanie, MD 03/24/17 [phone]

## 2017-03-24 NOTE — ED Notes (Signed)
Pt states that he now feels nauseated and dizzy, rechecked vitals and gave pt emesis bag.

## 2017-03-26 ENCOUNTER — Telehealth: Payer: Self-pay

## 2017-03-26 ENCOUNTER — Ambulatory Visit: Payer: Self-pay | Attending: Internal Medicine | Admitting: *Deleted

## 2017-03-26 ENCOUNTER — Telehealth: Payer: Self-pay | Admitting: Internal Medicine

## 2017-03-26 VITALS — BP 135/91

## 2017-03-26 DIAGNOSIS — F419 Anxiety disorder, unspecified: Secondary | ICD-10-CM | POA: Insufficient documentation

## 2017-03-26 DIAGNOSIS — I10 Essential (primary) hypertension: Secondary | ICD-10-CM | POA: Insufficient documentation

## 2017-03-26 NOTE — Telephone Encounter (Signed)
Met with patient today while he had his bp checked. Patient stated that he went to see his chiropractor and that the chiropractor recommended patient to see neurologist for the pain that he continues to experience in his neck and back. Pt stated that [facility] will not see him until PCP places the referral. Will forward message to provider. Explained to patient the importance of applying for the CAFA and/orange card because most places won't see uninsured patients. Patient stated that he will apply but that he will also ask his brother to pay (out of pocket) for the consult.

## 2017-03-26 NOTE — Telephone Encounter (Signed)
Met with the patient today when he came to the clinic for BP check. He stated that he went to [facility] today and then  showed this CM 3 new medication bottles that were prescribed today - doxapin, prazosin and venlafaxine.  He said that the doctor at [facility] told him to stop taking the prozac and trazadone which he said were not working. Instructed him regarding the importance of taking his medications as prescribed in order to assess if they are helping him. He said that he understood.   He also noted that he needs a refill for hydrochlorothiazide but is not sure if there are any refills. This CM called [facility] where it is noted that the prescription was sent. Spoke to [employer] in the Pharmacy who said that they have no record of the patient. He said that the [facility] has filled 2 medications for him but not this one. Explained the results of the pharmacy calls to the patient. He said that the prescription was filled at [facility]. Call placed to  [facility] # [phone] and spoke to [facility], who said that the patient has 1 refill left on his hydrochlorothiazide and they will fill that.  This CM explained to the patient that his hydrochlorothiazide will be refilled at [facility] and he was very appreciative of the assistance.   Update provided to Dr Wynetta Emery

## 2017-03-27 ENCOUNTER — Encounter (HOSPITAL_COMMUNITY): Payer: Self-pay | Admitting: Emergency Medicine

## 2017-03-27 ENCOUNTER — Other Ambulatory Visit: Payer: Self-pay

## 2017-03-27 ENCOUNTER — Encounter (HOSPITAL_COMMUNITY): Payer: Self-pay | Admitting: Neurology

## 2017-03-27 ENCOUNTER — Ambulatory Visit: Payer: Self-pay | Attending: Internal Medicine | Admitting: *Deleted

## 2017-03-27 ENCOUNTER — Emergency Department (HOSPITAL_COMMUNITY)
Admission: EM | Admit: 2017-03-27 | Discharge: 2017-03-27 | Disposition: A | Discharge status: Home / Self Care | Payer: Self-pay | Attending: Emergency Medicine | Admitting: Emergency Medicine

## 2017-03-27 ENCOUNTER — Emergency Department (HOSPITAL_COMMUNITY)
Admission: EM | Admit: 2017-03-27 | Discharge: 2017-03-27 | Discharge status: Against Medical Advice | Payer: Self-pay | Attending: Emergency Medicine | Admitting: Emergency Medicine

## 2017-03-27 VITALS — BP 127/85 | HR 80

## 2017-03-27 DIAGNOSIS — Z0130 Encounter for examination of blood pressure without abnormal findings: Secondary | ICD-10-CM

## 2017-03-27 DIAGNOSIS — I10 Essential (primary) hypertension: Secondary | ICD-10-CM | POA: Insufficient documentation

## 2017-03-27 DIAGNOSIS — R079 Chest pain, unspecified: Secondary | ICD-10-CM | POA: Insufficient documentation

## 2017-03-27 DIAGNOSIS — F1410 Cocaine abuse, uncomplicated: Secondary | ICD-10-CM | POA: Insufficient documentation

## 2017-03-27 DIAGNOSIS — M542 Cervicalgia: Secondary | ICD-10-CM | POA: Insufficient documentation

## 2017-03-27 DIAGNOSIS — G8929 Other chronic pain: Secondary | ICD-10-CM | POA: Insufficient documentation

## 2017-03-27 DIAGNOSIS — Z79899 Other long term (current) drug therapy: Secondary | ICD-10-CM | POA: Insufficient documentation

## 2017-03-27 DIAGNOSIS — Z5321 Procedure and treatment not carried out due to patient leaving prior to being seen by health care provider: Secondary | ICD-10-CM | POA: Insufficient documentation

## 2017-03-27 DIAGNOSIS — F419 Anxiety disorder, unspecified: Secondary | ICD-10-CM | POA: Insufficient documentation

## 2017-03-27 LAB — BASIC METABOLIC PANEL
Anion gap: 11 (ref 5–15)
BUN: 7 mg/dL (ref 6–20)
CO2: 24 mmol/L (ref 22–32)
Calcium: 9.1 mg/dL (ref 8.9–10.3)
Chloride: 96 mmol/L — ABNORMAL LOW (ref 101–111)
Creatinine, Ser: 0.75 mg/dL (ref 0.61–1.24)
GFR calc Af Amer: >60 mL/min (ref >60)
GFR calc non Af Amer: >60 mL/min (ref >60)
Glucose, Bld: 117 mg/dL — ABNORMAL HIGH (ref 65–99)
Potassium: 3.3 mmol/L — ABNORMAL LOW (ref 3.5–5.1)
Sodium: 131 mmol/L — ABNORMAL LOW (ref 135–145)

## 2017-03-27 LAB — I-STAT TROPONIN, ED: Troponin i, poc: 0.01 ng/mL (ref 0.00–0.08)

## 2017-03-27 LAB — CBC
HCT: 39.5 % (ref 39.0–52.0)
Hemoglobin: 12.9 g/dL — ABNORMAL LOW (ref 13.0–17.0)
MCH: 27 pg (ref 26.0–34.0)
MCHC: 32.7 g/dL (ref 30.0–36.0)
MCV: 82.8 fL (ref 78.0–100.0)
Platelets: 276 10*3/uL (ref 150–400)
RBC: 4.77 MIL/uL (ref 4.22–5.81)
RDW: 13.9 % (ref 11.5–15.5)
WBC: 5.7 10*3/uL (ref 4.0–10.5)
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
## 2017-03-27 NOTE — ED Triage Notes (Addendum)
Pt here c/o right sided neck pain since 0100 this morning. Denies any injury. Woke him up from sleep. Also wants to have his BP checked. Is 127/84 today. He does report h/a. Has not missed any doses of his BP med.

## 2017-03-27 NOTE — ED Notes (Signed)
Pt called for in waiting area x3 by radiology for Xray. No answer from pt on all 3 separate attempts by radiology. This tech also call for [place] x3 for vital sign reassessment. No answer.

## 2017-03-27 NOTE — ED Triage Notes (Signed)
Pt states he woke up this morning feeling very SOB and having left sided cp. Endorses Nausea as well

## 2017-03-27 NOTE — Progress Notes (Signed)
[facility] to request BP check States he took BP medication today.

## 2017-03-28 ENCOUNTER — Emergency Department (HOSPITAL_COMMUNITY)
Admission: EM | Admit: 2017-03-28 | Discharge: 2017-03-28 | Disposition: A | Discharge status: Home / Self Care | Payer: Self-pay | Attending: Physician Assistant | Admitting: Physician Assistant

## 2017-03-28 ENCOUNTER — Encounter (HOSPITAL_COMMUNITY): Payer: Self-pay

## 2017-03-28 ENCOUNTER — Encounter (HOSPITAL_COMMUNITY): Payer: Self-pay | Admitting: *Deleted

## 2017-03-28 ENCOUNTER — Encounter: Payer: Self-pay | Admitting: Neurology

## 2017-03-28 DIAGNOSIS — I10 Essential (primary) hypertension: Secondary | ICD-10-CM | POA: Insufficient documentation

## 2017-03-28 DIAGNOSIS — H538 Other visual disturbances: Secondary | ICD-10-CM | POA: Insufficient documentation

## 2017-03-28 DIAGNOSIS — M542 Cervicalgia: Secondary | ICD-10-CM | POA: Insufficient documentation

## 2017-03-28 DIAGNOSIS — G8929 Other chronic pain: Secondary | ICD-10-CM | POA: Insufficient documentation

## 2017-03-28 DIAGNOSIS — R112 Nausea with vomiting, unspecified: Secondary | ICD-10-CM | POA: Insufficient documentation

## 2017-03-28 DIAGNOSIS — F419 Anxiety disorder, unspecified: Secondary | ICD-10-CM

## 2017-03-28 DIAGNOSIS — Z79899 Other long term (current) drug therapy: Secondary | ICD-10-CM | POA: Insufficient documentation

## 2017-03-28 LAB — CBG MONITORING, ED: Glucose-Capillary: 121 mg/dL — ABNORMAL HIGH (ref 65–99)

## 2017-03-28 MED ORDER — METHOCARBAMOL 500 MG PO TABS: 500.0000 mg | ORAL_TABLET | Freq: Two times a day (BID) | ORAL | 0 refills | Status: DC

## 2017-03-28 MED ORDER — PROMETHAZINE HCL 25 MG PO TABS: 25.0000 mg | ORAL_TABLET | Freq: Four times a day (QID) | ORAL | 0 refills | Status: DC | PRN

## 2017-03-28 MED ORDER — PROMETHAZINE HCL 25 MG PO TABS
25.0000 mg | ORAL_TABLET | Freq: Once | ORAL | Status: AC
  Administered 2017-03-28: 25 mg via ORAL
  Filled 2017-03-28: qty 1

## 2017-03-28 NOTE — ED Triage Notes (Signed)
Pt states that at 11am he took this atarax that was prescribed for anxiety and has since felt like his vision is blurry. Denies dizziness or headache. States everything is just cloudy.Pt worried it is a side effect of the medication.  Neuro intact
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
## 2017-03-28 NOTE — ED Notes (Signed)
Seen here yesterday for same. Pt has multiple complaints- states was unable to sleep, was shaky, nervous -- just started taking Effexor on 7/16-- out of Lorazepam. Also c/o's nausea. No appetite

## 2017-03-28 NOTE — ED Triage Notes (Signed)
Pt states was here last night for neck pain x 3 months and sob.  Sob has now resolved (he thinks it was his normal anxiety).  Would also like to be tx for nausea every am when he wakes up.

## 2017-03-29 ENCOUNTER — Other Ambulatory Visit: Payer: Self-pay

## 2017-03-29 ENCOUNTER — Emergency Department (HOSPITAL_COMMUNITY)
Admission: EM | Admit: 2017-03-29 | Discharge: 2017-03-29 | Disposition: A | Discharge status: Home / Self Care | Payer: Self-pay | Attending: Emergency Medicine | Admitting: Emergency Medicine

## 2017-03-29 ENCOUNTER — Emergency Department (HOSPITAL_COMMUNITY)
Admission: EM | Admit: 2017-03-29 | Discharge: 2017-03-29 | Discharge status: Against Medical Advice | Payer: Self-pay | Attending: Emergency Medicine | Admitting: Emergency Medicine

## 2017-03-29 ENCOUNTER — Ambulatory Visit: Payer: Self-pay | Attending: Internal Medicine

## 2017-03-29 ENCOUNTER — Encounter (HOSPITAL_COMMUNITY): Payer: Self-pay | Admitting: Nurse Practitioner

## 2017-03-29 ENCOUNTER — Telehealth: Payer: Self-pay

## 2017-03-29 ENCOUNTER — Encounter (HOSPITAL_COMMUNITY): Payer: Self-pay | Admitting: Emergency Medicine

## 2017-03-29 VITALS — BP 137/98

## 2017-03-29 DIAGNOSIS — F419 Anxiety disorder, unspecified: Secondary | ICD-10-CM | POA: Insufficient documentation

## 2017-03-29 DIAGNOSIS — R42 Dizziness and giddiness: Secondary | ICD-10-CM | POA: Insufficient documentation

## 2017-03-29 DIAGNOSIS — Z0130 Encounter for examination of blood pressure without abnormal findings: Secondary | ICD-10-CM

## 2017-03-29 DIAGNOSIS — I10 Essential (primary) hypertension: Secondary | ICD-10-CM | POA: Insufficient documentation

## 2017-03-29 DIAGNOSIS — Z5321 Procedure and treatment not carried out due to patient leaving prior to being seen by health care provider: Secondary | ICD-10-CM | POA: Insufficient documentation

## 2017-03-29 DIAGNOSIS — Z79899 Other long term (current) drug therapy: Secondary | ICD-10-CM | POA: Insufficient documentation

## 2017-03-29 NOTE — ED Triage Notes (Signed)
Per EMS:  Pt presents to ED after starting effexor this morning stating that he is having palpitations, shortness of breath and dizziness.  Pt c/o anxiety.  Pt c/o posterior neck pain.

## 2017-03-29 NOTE — Progress Notes (Signed)
Pt came into the office for a bp check  

## 2017-03-29 NOTE — Care Management (Signed)
ED CM [facility] with [facility] CM patient has had visits to ED and PCP office today with same complaint SOB and patient suffers from anxiety and is [facility].  Patient has had 67 ED visits in the past 6 months. Clinic has been monitoring patient very closely despite close monitoring, patient continues to return to ED and PCP office several times daily.  ED CM will continue to follow up after patient is placed in a room.

## 2017-03-29 NOTE — ED Notes (Signed)
Called to be roomed with no answer.

## 2017-03-29 NOTE — ED Notes (Signed)
Still not answering when called.  Removed from system at this time.

## 2017-03-29 NOTE — ED Triage Notes (Signed)
Pt presents with c/o anxiety. He was here this morning for anxiety and left prior to being seen to drive to his doctors office. He reports he became very anxious while driving so he came back to emergency department.

## 2017-03-29 NOTE — ED Notes (Signed)
Pt has multiple visits for "anxiety", including yesterday. Pt is in no acute distress.

## 2017-03-29 NOTE — ED Provider Notes (Signed)
[facility] Provider Note   CSN: [id number] Arrival date & time: 03/29/17  1327     History   Chief Complaint Chief Complaint  Patient presents with  . Anxiety    HPI Wesley [name] is a 51 y.o. male.   HPI   51 year old male presents for the 76th time the last 6 months.  Patient reports anxiety reporting he feels short of breath, has a dry mouth and pain in the back of his neck.  He reports he has been taking his hydroxyzine at home which has not improved his symptoms.  He is requesting Lorazepam here in the ED as this is the only medication that seems to improve his symptoms.  Patient denies any other significant complaints.  Reports this feels similar to previous anxiety.  Patient denies any drug or alcohol use.   Past Medical History:  Diagnosis Date  . Alcohol abuse   . Anxiety   . Chronic neck pain   . Cocaine abuse   . Hypertension     Patient Active Problem List   Diagnosis Date Noted  . DJD (degenerative joint disease) of cervical spine 03/16/2017  . BP check 03/13/2017  . 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 Dizziness 01/23/2017  . Back pain 11/27/2016  . Bilateral posterior neck pain 11/27/2016  . Epigastric abdominal pain 11/21/2016  . T wave inversion in EKG 11/21/2016  . Nausea 11/21/2016  . Anxiety 11/21/2016    Past Surgical History:  Procedure Laterality Date  . EYE SURGERY Right ~ 1980   "hit my eye"       Home Medications    Prior to Admission medications   Medication Sig Start Date End Date Taking? Authorizing Provider  amLODipine (NORVASC) 5 MG tablet Take 1 tablet (5 mg total) by mouth daily. 02/21/17   Anders SimmondsMcClung, Angela M, PA-C  FLUoxetine (PROZAC) 10 MG capsule Take 10 mg by mouth daily.    [provider]  hydrochlorothiazide (HYDRODIURIL) 12.5 MG tablet Take 1 tablet (12.5 mg total) by mouth daily. 02/25/17   Elson AreasSofia, Leslie K, PA-C  hydrOXYzine (VISTARIL) 50 MG capsule Take 50 mg by mouth 3 (three) times daily as needed.     [provider]  LORazepam (ATIVAN) 1 MG tablet Take 1 tablet (1 mg total) by mouth 3 (three) times daily as needed for anxiety. 03/24/17   Rolan BuccoBelfi, Melanie, MD  meloxicam (MOBIC) 15 MG tablet Take 1 tablet (15 mg total) by mouth daily. Patient not taking: Reported on 03/23/2017 03/22/17   Rolland PorterJames, Mark, MD  methocarbamol (ROBAXIN) 500 MG tablet Take 1 tablet (500 mg total) by mouth 2 (two) times daily. 03/28/17   Audry PiliMohr, Tyler, PA-C  metoCLOPramide (REGLAN) 10 MG tablet Take 1 tablet (10 mg total) by mouth every 8 (eight) hours as needed for nausea. Patient not taking: Reported on 03/02/2017 01/14/17 03/02/17  Michela PitcherFawze, Mina A, PA-C  omeprazole (PRILOSEC) 20 MG capsule Take 1 capsule (20 mg total) by mouth daily. 03/21/17   Marcine MatarJohnson, Deborah B, MD  promethazine (PHENERGAN) 25 MG tablet Take 1 tablet (25 mg total) by mouth every 6 (six) hours as needed for nausea or vomiting. 03/28/17   Audry PiliMohr, Tyler, PA-C  traZODone (DESYREL) 50 MG tablet Take 50 mg by mouth at bedtime.    [provider]  Vitamin D, Ergocalciferol, (DRISDOL) 50000 units CAPS capsule Take 1 capsule (50,000 Units total) by mouth every 7 (seven) days. Patient not taking: Reported on 03/23/2017 03/22/17   Marcine MatarJohnson, Deborah B, MD  Family History Family History  Problem Relation Age of Onset  . Hypertension Mother   . Hypertension Father     Social History Social History  Substance Use Topics  . Smoking status: Never Smoker  . Smokeless tobacco: Never Used  . Alcohol use No     Comment: 11/21/2016 "last drank 06/2016"; denies      Allergies   Motrin [ibuprofen]; Other; and Zofran [ondansetron hcl]   Review of Systems Review of Systems  All other systems reviewed and are negative.   Physical Exam Updated Vital Signs BP (!) 131/97 (BP Location: Right Arm)   Pulse (!) 58   Temp 97.7 F (36.5 C) (Oral)   Resp 18   SpO2 98%   Physical Exam  Constitutional: He is oriented to person, place, and time. He appears  well-developed and well-nourished.  HENT:  Head: Normocephalic and atraumatic.  Eyes: Pupils are equal, round, and reactive to light. Conjunctivae are normal. Right eye exhibits no discharge. Left eye exhibits no discharge. No scleral icterus.  Neck: Normal range of motion. No JVD present. No tracheal deviation present.  Pulmonary/Chest: Effort normal. No stridor.  Neurological: He is alert and oriented to person, place, and time. Coordination normal.  Psychiatric: He has a normal mood and affect. His behavior is normal. Judgment and thought content normal.  Nursing note and vitals reviewed.    ED Treatments / Results  Labs (all labs ordered are listed, but only abnormal results are displayed) Labs Reviewed - No data to display  EKG  EKG Interpretation None       Radiology No results found.  Procedures Procedures (including critical care time)  Medications Ordered in ED Medications - No data to display   Initial Impression / Assessment and Plan / ED Course  I have reviewed the triage vital signs and the nursing notes.  Pertinent labs & imaging results that were available during my care of the patient were reviewed by me and considered in my medical decision making (see chart for details).      Final Clinical Impressions(s) / ED Diagnoses   Final diagnoses:  Anxiety   Labs:   Imaging:  Consults:  Therapeutics:  Discharge Meds:   Assessment/Plan: 51 year old male presents today with anxiety.  He is well-appearing in no acute distress.  He has reassuring vital signs and will be discharged with outpatient follow-up.      New Prescriptions Discharge Medication List as of 03/29/2017  2:28 PM       Eyvonne Mechanic, PA-C 03/29/17 1445    Cathren Laine, MD 03/29/17 [phone]

## 2017-03-29 NOTE — ED Triage Notes (Signed)
Pt here for anxiety; pt seen for same yesterday and many times

## 2017-03-29 NOTE — ED Notes (Signed)
Pt returns for c/o anxiety.

## 2017-03-29 NOTE — Telephone Encounter (Signed)
Met with the patient when he came to the clinic this morning.  He explained that he was anxious and had been to the ED last night because he was experiencing blurry vision.  He stated that there were too many people in the ED and he left. He noted that the blurred vision has resolved but he is still anxious. He was teary at times. He denied any SI/HI.   He had questions about taking his medications and this CM reviewed his medication orders with him.   He said that he just started taking the  Venlafaxine this morning that was ordered by [facility]. Explained to him the importance of taking the medication as prescribed and noted that it will take time/weeks for him to feel the effects of the medication.   He spoke about his anxiety and worries of the side effects of medications. He also spoke to Theda Sers, [facility] who answered his questions about his medications. He stated that he had not taken his hydroxyzine 50 mg this morning and he took it while in the office.   He said that he will go to [facility] after leaving this clinic to inquire about counseling. His next appointment at [facility] for medication management is not until 04/2017 and he stated that he hopes to be able to speak to a psychiatrist in person and not just speak to the doctor on the " screen." He also spoke to Christa See, [place] when in the office. He was given the phone # for Mobile Crisis  - # [phone].  Reminded him of his appointment at [facility] on 04/02/17.  Update provided to Dr Wynetta Emery regarding the patient's status at the appointment.

## 2017-03-29 NOTE — ED Notes (Signed)
Pt and wife walked out of department. PA informed.

## 2017-03-29 NOTE — Discharge Instructions (Signed)
Please read attached information. If you experience any new or worsening signs or symptoms please return to the emergency room for evaluation. Please follow-up with your primary care provider or specialist as discussed.  °

## 2017-03-29 NOTE — Telephone Encounter (Signed)
Call placed to Michel BickersWandalyn Rogers, RN CM to discuss the difficulties trying to meet the patient's needs in the outpatient setting. He has been in the ED 4 times today and [facility] to this CM, the SW and [facility].  He also notes that he frequently goes to [facility] to have his BP checked.   He has been evaluated at  [facility] but has not been compliant with taking his medications as ordered and is trying to cope with his anxiety. He has no insurance and has not completed the application for the [employer] despite meeting with the [facility] Financial Counselor.

## 2017-04-01 ENCOUNTER — Ambulatory Visit (HOSPITAL_COMMUNITY)
Admission: EM | Admit: 2017-04-01 | Discharge: 2017-04-01 | Disposition: A | Discharge status: Home / Self Care | Payer: Self-pay | Attending: Family Medicine | Admitting: Family Medicine

## 2017-04-01 ENCOUNTER — Encounter (HOSPITAL_COMMUNITY): Payer: Self-pay | Admitting: Emergency Medicine

## 2017-04-01 DIAGNOSIS — H8110 Benign paroxysmal vertigo, unspecified ear: Secondary | ICD-10-CM

## 2017-04-01 DIAGNOSIS — F410 Panic disorder [episodic paroxysmal anxiety] without agoraphobia: Secondary | ICD-10-CM

## 2017-04-01 DIAGNOSIS — M542 Cervicalgia: Secondary | ICD-10-CM

## 2017-04-01 MED ORDER — METHOCARBAMOL 500 MG PO TABS: 500.0000 mg | ORAL_TABLET | Freq: Two times a day (BID) | ORAL | 0 refills | Status: DC

## 2017-04-01 MED ORDER — SERTRALINE HCL 25 MG PO TABS: 25.0000 mg | ORAL_TABLET | Freq: Every day | ORAL | 0 refills | Status: DC

## 2017-04-01 MED ORDER — MECLIZINE HCL 25 MG PO TABS: ORAL_TABLET | ORAL | 0 refills | Status: DC

## 2017-04-01 NOTE — ED Triage Notes (Signed)
Onset 2 hours ago of feeling dizzy and heart beat going fast.  Patient has a history of anxiety.  Patient has no chest pain, but has pain in left back.

## 2017-04-01 NOTE — ED Provider Notes (Signed)
CSN: [id number]     Arrival date & time 04/01/17  1522 History   None    Chief Complaint  Patient presents with  . Dizziness   (Consider location/radiation/quality/duration/timing/severity/associated sxs/prior Treatment) Patient c/o anxiety and vertigo.    The history is provided by the patient.  Anxiety  This is a new problem. The problem has not changed since onset.Nothing aggravates the symptoms. Nothing relieves the symptoms.    Past Medical History:  Diagnosis Date  . Alcohol abuse   . Anxiety   . Chronic neck pain   . Cocaine abuse   . Hypertension    Past Surgical History:  Procedure Laterality Date  . EYE SURGERY Right ~ 1980   "hit my eye"   Family History  Problem Relation Age of Onset  . Hypertension Mother   . Hypertension Father    Social History  Substance Use Topics  . Smoking status: Never Smoker  . Smokeless tobacco: Never Used  . Alcohol use No     Comment: 11/21/2016 "last drank 06/2016"; denies     Review of Systems  Constitutional: Negative.   HENT: Negative.   Eyes: Negative.   Respiratory: Negative.   Cardiovascular: Negative.   Gastrointestinal: Negative.   Endocrine: Negative.   Genitourinary: Negative.   Musculoskeletal: Negative.   Allergic/Immunologic: Negative.   Neurological: Positive for dizziness.    Allergies  Motrin [ibuprofen]; Other; and Zofran [ondansetron hcl]  Home Medications   Prior to Admission medications   Medication Sig Start Date End Date Taking? Authorizing Provider  amLODipine (NORVASC) 5 MG tablet Take 1 tablet (5 mg total) by mouth daily. 02/21/17  Yes Georgian CoMcClung, Angela M, PA-C  FLUoxetine (PROZAC) 10 MG capsule Take 10 mg by mouth daily.   Yes [provider]  hydrochlorothiazide (HYDRODIURIL) 12.5 MG tablet Take 1 tablet (12.5 mg total) by mouth daily. 02/25/17  Yes Cheron SchaumannSofia, Leslie K, PA-C  hydrOXYzine (VISTARIL) 50 MG capsule Take 50 mg by mouth 3 (three) times daily as needed.   Yes [provider]  LORazepam (ATIVAN) 1 MG tablet Take 1 tablet (1 mg total) by mouth 3 (three) times daily as needed for anxiety. 03/24/17   Rolan BuccoBelfi, Melanie, MD  meclizine (ANTIVERT) 25 MG tablet One po tid prn dizziness 04/01/17   Deatra Canterxford, William J, FNP  meloxicam (MOBIC) 15 MG tablet Take 1 tablet (15 mg total) by mouth daily. Patient not taking: Reported on 03/23/2017 03/22/17   Rolland PorterJames, Mark, MD  methocarbamol (ROBAXIN) 500 MG tablet Take 1 tablet (500 mg total) by mouth 2 (two) times daily. 04/01/17   Deatra Canterxford, William J, FNP  metoCLOPramide (REGLAN) 10 MG tablet Take 1 tablet (10 mg total) by mouth every 8 (eight) hours as needed for nausea. Patient not taking: Reported on 03/02/2017 01/14/17 03/02/17  Michela PitcherFawze, Mina A, PA-C  omeprazole (PRILOSEC) 20 MG capsule Take 1 capsule (20 mg total) by mouth daily. 03/21/17   Marcine MatarJohnson, Deborah B, MD  promethazine (PHENERGAN) 25 MG tablet Take 1 tablet (25 mg total) by mouth every 6 (six) hours as needed for nausea or vomiting. 03/28/17   Audry PiliMohr, Tyler, PA-C  sertraline (ZOLOFT) 25 MG tablet Take 1 tablet (25 mg total) by mouth daily. 04/01/17   Deatra Canterxford, William J, FNP  traZODone (DESYREL) 50 MG tablet Take 50 mg by mouth at bedtime.    [provider]  Vitamin D, Ergocalciferol, (DRISDOL) 50000 units CAPS capsule Take 1 capsule (50,000 Units total) by mouth every 7 (seven) days. Patient not  taking: Reported on 03/23/2017 03/22/17   Marcine Matar, MD   Meds Ordered and Administered this Visit  Medications - No data to display  BP (!) 123/93 (BP Location: Right Arm)   Pulse 79   Temp 98.4 F (36.9 C) (Oral)   Resp 18   SpO2 100%  No data found.   Physical Exam  Constitutional: He is oriented to person, place, and time. He appears well-developed and well-nourished.  HENT:  Head: Normocephalic and atraumatic.  Eyes: Pupils are equal, round, and reactive to light. Conjunctivae and EOM are normal.  Neck: Normal range of motion. Neck supple.  Cardiovascular:  Normal rate, regular rhythm and normal heart sounds.   Pulmonary/Chest: Effort normal and breath sounds normal.  Abdominal: Soft. Bowel sounds are normal.  Genitourinary:  Genitourinary Comments: Declines exam  Neurological: He is alert and oriented to person, place, and time.  Nursing note and vitals reviewed.   Urgent Care Course     Procedures (including critical care time)  Labs Review Labs Reviewed - No data to display  Imaging Review No results found.   Visual Acuity Review  Right Eye Distance:   Left Eye Distance:   Bilateral Distance:    Right Eye Near:   Left Eye Near:    Bilateral Near:         MDM   1. Panic   2. Cervicalgia   3. Benign paroxysmal positional vertigo, unspecified laterality    Sertraline 25mg  one po qd #30 According to chart he was on Fluoxetine and stopped taking.  Will start sertraline low dose and Follow up with PCP in 2 weeks.  Robaxin for cervcalgia  Antivert for dizziness.         Deatra Canter, [place] 04/01/17 [phone]

## 2017-04-01 NOTE — Discharge Instructions (Signed)
Follow up with your PCP within 2 weeks

## 2017-04-02 ENCOUNTER — Emergency Department (HOSPITAL_COMMUNITY)
Admission: EM | Admit: 2017-04-02 | Discharge: 2017-04-02 | Disposition: A | Discharge status: Home / Self Care | Payer: Self-pay | Attending: Emergency Medicine | Admitting: Emergency Medicine

## 2017-04-02 ENCOUNTER — Encounter (HOSPITAL_COMMUNITY): Payer: Self-pay | Admitting: Emergency Medicine

## 2017-04-02 ENCOUNTER — Telehealth: Payer: Self-pay

## 2017-04-02 ENCOUNTER — Emergency Department (HOSPITAL_COMMUNITY): Payer: Self-pay

## 2017-04-02 ENCOUNTER — Encounter: Payer: Self-pay | Admitting: Internal Medicine

## 2017-04-02 ENCOUNTER — Ambulatory Visit: Payer: Self-pay | Attending: Internal Medicine | Admitting: Internal Medicine

## 2017-04-02 VITALS — BP 139/89 | HR 95 | Temp 98.6°F | Resp 16 | Wt 184.4 lb

## 2017-04-02 DIAGNOSIS — I10 Essential (primary) hypertension: Secondary | ICD-10-CM | POA: Insufficient documentation

## 2017-04-02 DIAGNOSIS — Z79899 Other long term (current) drug therapy: Secondary | ICD-10-CM | POA: Insufficient documentation

## 2017-04-02 DIAGNOSIS — R0602 Shortness of breath: Secondary | ICD-10-CM | POA: Insufficient documentation

## 2017-04-02 DIAGNOSIS — Z9114 Patient's other noncompliance with medication regimen: Secondary | ICD-10-CM | POA: Insufficient documentation

## 2017-04-02 DIAGNOSIS — Z888 Allergy status to other drugs, medicaments and biological substances status: Secondary | ICD-10-CM | POA: Insufficient documentation

## 2017-04-02 DIAGNOSIS — F419 Anxiety disorder, unspecified: Secondary | ICD-10-CM | POA: Insufficient documentation

## 2017-04-02 DIAGNOSIS — Z8249 Family history of ischemic heart disease and other diseases of the circulatory system: Secondary | ICD-10-CM | POA: Insufficient documentation

## 2017-04-02 DIAGNOSIS — E559 Vitamin D deficiency, unspecified: Secondary | ICD-10-CM | POA: Insufficient documentation

## 2017-04-02 DIAGNOSIS — M47812 Spondylosis without myelopathy or radiculopathy, cervical region: Secondary | ICD-10-CM | POA: Insufficient documentation

## 2017-04-02 LAB — CBC
HCT: 42.1 % (ref 39.0–52.0)
Hemoglobin: 14.5 g/dL (ref 13.0–17.0)
MCH: 27.6 pg (ref 26.0–34.0)
MCHC: 34.4 g/dL (ref 30.0–36.0)
MCV: 80.2 fL (ref 78.0–100.0)
Platelets: 288 10*3/uL (ref 150–400)
RBC: 5.25 MIL/uL (ref 4.22–5.81)
RDW: 13.5 % (ref 11.5–15.5)
WBC: 6.1 10*3/uL (ref 4.0–10.5)

## 2017-04-02 LAB — I-STAT TROPONIN, ED: Troponin i, poc: 0 ng/mL (ref 0.00–0.08)

## 2017-04-02 LAB — BASIC METABOLIC PANEL
Anion gap: 11 (ref 5–15)
BUN: 8 mg/dL (ref 6–20)
CO2: 27 mmol/L (ref 22–32)
Calcium: 9.6 mg/dL (ref 8.9–10.3)
Chloride: 97 mmol/L — ABNORMAL LOW (ref 101–111)
Creatinine, Ser: 0.8 mg/dL (ref 0.61–1.24)
GFR calc Af Amer: >60 mL/min (ref >60)
GFR calc non Af Amer: >60 mL/min (ref >60)
Glucose, Bld: 104 mg/dL — ABNORMAL HIGH (ref 65–99)
Potassium: 3.8 mmol/L (ref 3.5–5.1)
Sodium: 135 mmol/L (ref 135–145)

## 2017-04-02 MED ORDER — LORAZEPAM 0.5 MG PO TABS: 0.5000 mg | ORAL_TABLET | Freq: Two times a day (BID) | ORAL | 0 refills | Status: DC | PRN

## 2017-04-02 NOTE — Discharge Instructions (Signed)
Go immediately to the wellness clinic as directed. They are expecting you.   Take the medications that they gave he has directed.  Return to the ED for any chest pain, difficulty breathing, fever, vomiting, or any other worsening or concerning symptoms.

## 2017-04-02 NOTE — ED Provider Notes (Signed)
[facility] DEPT Provider Note   CSN: [id number] Arrival date & time: 04/02/17  1442  By signing my name below, I, [name], attest that this documentation has been prepared under the direction and in the presence of Graciella Freer, PA-C. Electronically Signed: [name], ED Scribe. 04/02/17. 4:34 PM.  History   Chief Complaint Chief Complaint  Patient presents with  . Anxiety    HPI Wesley [name] is a 51 y.o. male with a PMHx of anxiety, and HTN, who presents to the Emergency Department complaining of worsening SOB  that started today. Patient was seen in the emergency department early this morning for same symptoms. He was discharged and prompted to get his primary care doctor for evaluation. He was seen by his primary care doctor earlier this morning and discharged with lorazepam and hydroxyzine. Patient states that he took those medications as directed. He states that once he got home he became very anxious about his symptoms and had some shortness of breath. So he called EMS to return the emergency department for evaluation. Patient reports that on EMS arrival his blood pressure was elevated which made him very concerned and anxious. He reports that in the ED Z shortness of breath has improved.Patient states that he took his morning dose of his blood pressure medication but has not taken his evening dose. Patient denies any fever, chest pain.   The history is provided by the patient. No language interpreter was used.    Past Medical History:  Diagnosis Date  . Alcohol abuse   . Anxiety   . Chronic neck pain   . Cocaine abuse   . Hypertension     Patient Active Problem List   Diagnosis Date Noted  . DJD (degenerative joint disease) of cervical spine 03/16/2017  . BP check 03/13/2017  . 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 Dizziness 01/23/2017  . Back pain 11/27/2016  . Bilateral posterior neck pain 11/27/2016  . Epigastric abdominal pain 11/21/2016  . T wave inversion in  EKG 11/21/2016  . Nausea 11/21/2016  . Anxiety 11/21/2016    Past Surgical History:  Procedure Laterality Date  . EYE SURGERY Right ~ 1980   "hit my eye"       Home Medications    Prior to Admission medications   Medication Sig Start Date End Date Taking? Authorizing Provider  amLODipine (NORVASC) 5 MG tablet Take 1 tablet (5 mg total) by mouth daily. 02/21/17   Anders Simmonds, PA-C  hydrochlorothiazide (HYDRODIURIL) 12.5 MG tablet Take 1 tablet (12.5 mg total) by mouth daily. 02/25/17   Elson Areas, PA-C  LORazepam (ATIVAN) 0.5 MG tablet Take 1 tablet (0.5 mg total) by mouth 2 (two) times daily as needed for anxiety. 04/02/17   Marcine Matar, MD  LORazepam (ATIVAN) 1 MG tablet Take 1 tablet (1 mg total) by mouth 3 (three) times daily as needed for anxiety. 03/24/17   Rolan Bucco, MD  meclizine (ANTIVERT) 25 MG tablet One po tid prn dizziness 04/01/17   Deatra Canter, FNP  meloxicam (MOBIC) 15 MG tablet Take 1 tablet (15 mg total) by mouth daily. Patient not taking: Reported on 03/23/2017 03/22/17   Rolland Porter, MD  methocarbamol (ROBAXIN) 500 MG tablet Take 1 tablet (500 mg total) by mouth 2 (two) times daily. 04/01/17   Deatra Canter, FNP  omeprazole (PRILOSEC) 20 MG capsule Take 1 capsule (20 mg total) by mouth daily. 03/21/17   Marcine Matar, MD  promethazine (PHENERGAN) 25 MG tablet  Take 1 tablet (25 mg total) by mouth every 6 (six) hours as needed for nausea or vomiting. 03/28/17   Audry PiliMohr, Tyler, PA-C  Vitamin D, Ergocalciferol, (DRISDOL) 50000 units CAPS capsule Take 1 capsule (50,000 Units total) by mouth every 7 (seven) days. Patient not taking: Reported on 03/23/2017 03/22/17   Marcine MatarJohnson, Deborah B, MD    Family History Family History  Problem Relation Age of Onset  . Hypertension Mother   . Hypertension Father     Social History Social History  Substance Use Topics  . Smoking status: Never Smoker  . Smokeless tobacco: Never Used  . Alcohol use No      Comment: 11/21/2016 "last drank 06/2016"; denies      Allergies   Motrin [ibuprofen]; Other; and Zofran [ondansetron hcl]   Review of Systems Review of Systems  Constitutional: Negative for fever.  Respiratory: Positive for shortness of breath.   Cardiovascular: Negative for chest pain.  Psychiatric/Behavioral: The patient is nervous/anxious.      Physical Exam Updated Vital Signs BP 119/85 (BP Location: Left Arm)   Pulse 91   Temp 98.3 F (36.8 C) (Oral)   Resp 16   SpO2 100%   Physical Exam  Constitutional: He appears well-developed and well-nourished.  Very anxious appearing but no acute distress.  HENT:  Head: Normocephalic and atraumatic.  Eyes: Conjunctivae and EOM are normal. Right eye exhibits no discharge. Left eye exhibits no discharge. No scleral icterus.  Cardiovascular: Normal rate and regular rhythm.   Pulses:      Radial pulses are 2+ on the right side, and 2+ on the left side.  Pulmonary/Chest: Effort normal and breath sounds normal. He has no wheezes. He has no rales.  No evidence of respiratory distress. Able to speak in full sentences without difficulty.  Neurological: He is alert.  Skin: Skin is warm and dry.  Psychiatric: His speech is normal and behavior is normal. His mood appears anxious.  Nursing note and vitals reviewed.    ED Treatments / Results  DIAGNOSTIC STUDIES: Oxygen Saturation is 99% on RA, normal by my interpretation.   COORDINATION OF CARE: 4:34 PM-Discussed next steps with pt which includes rechecking his BP and then following up with his PCP. Pt verbalized understanding and is agreeable with the plan.   Labs (all labs ordered are listed, but only abnormal results are displayed) Labs Reviewed - No data to display  EKG  EKG Interpretation None       Radiology Dg Chest 2 View  Result Date: 04/02/2017 CLINICAL DATA:  Shortness of breath.  Nonsmoker.  Hypertension EXAM: CHEST  2 VIEW COMPARISON:  03/22/2017 FINDINGS:  The heart size and mediastinal contours are within normal limits. Both lungs are clear. The visualized skeletal structures are unremarkable. IMPRESSION: No active cardiopulmonary disease. Electronically Signed   By: Signa Kellaylor  Stroud M.D.   On: 04/02/2017 08:47    Procedures Procedures (including critical care time)  Medications Ordered in ED Medications - No data to display   Initial Impression / Assessment and Plan / ED Course  I have reviewed the triage vital signs and the nursing notes.  Pertinent labs & imaging results that were available during my care of the patient were reviewed by me and considered in my medical decision making (see chart for details).     51 year old male with past medical history of anxiety who presents to the ED with worsening anxiety and difficulty breathing. Seen in the emergency department this morning for the same.  Was seen by his primary care doctor this afternoon for evaluation. Patient is afebrile, non-toxic appearing, sitting comfortably on examination table. Vital signs reviewed. Patient is slightly hypertensive, likely secondary to patient's anxiety. O2 sat is greater than 95% on room air. Will plan to repeat. Do not suspect hypertensive urgency or emergency.   Received a call from case manager over at [facility], who is aware of patient's repeat ED visit. She has been in contact with his primary care doctor at the [facility], who will plan to see him on discharge from this ED visit. She states that if there are no other issues other than patient's previous symptoms, he can be seen at once, immediately.   Review of patient's records show that he has been seen 80 times in the last 6 months for similar episodes. On my exam, patient is in no acute distress. He is has no signs of shortness of breath and states that that has improved. No evidence of respiratory distress. Repeat blood pressure shows improvement.Patient stable for discharge at this  time. Instructed him that he needs to go immediately to the illness clinic for further evaluation. Strict return precautions. This patient's breasts understanding and agreement to plan.   Final Clinical Impressions(s) / ED Diagnoses   Final diagnoses:  Anxiety    New Prescriptions Discharge Medication List as of 04/02/2017  4:38 PM     I personally performed the services described in this documentation, which was scribed in my presence. The recorded information has been reviewed and is accurate.     Maxwell Caul, PA-C 04/02/17 1655    Tegeler, Canary Brim, MD 04/03/17 1131

## 2017-04-02 NOTE — ED Triage Notes (Signed)
Pt c/o SOB, dizziness, posterior neck pain radiating to left jaw, bilateral blurred vision throughout visual, right finger, hand, forearm tingling onset 0500 today when he walked to the bathroom on waking up.  Blood pressure was elevated at 157/99, took hydroxyzine and amlodipine this morning to lower.

## 2017-04-02 NOTE — ED Notes (Signed)
Patient was alert, oriented and stable upon discharge. RN went over AVS and patient had no further questions.  

## 2017-04-02 NOTE — ED Triage Notes (Signed)
Per EMS-states he was just discharged form this facility for same symptoms-went and had his meds filled and became anxious-returning to facility for further eval

## 2017-04-02 NOTE — Patient Instructions (Signed)
Stop Hydroxyzine. Start Lorazepam as instructed.  Take Effexor as prescribed from [facility].
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
## 2017-04-02 NOTE — Telephone Encounter (Signed)
While the patient was in the ED this afternoon, this CM discussed the patient's status with Dr Hyman HopesJegede. He stated that the patient should be instructed to return to [facility]. This CM shared this information with Michel BickersWandalyn Rogers, RN CM

## 2017-04-02 NOTE — Progress Notes (Signed)
Patient ID: Wesley [name], male    DOB: 12/31/1965  MRN: [id number]  CC: Follow-up   Subjective: Wesley [name] is a 51 y.o. male who presents for follow-up anxiety. His concerns today include:  Patient with history of anxiety, hypertension, DJD of cervical spine.  Since last visit with me patient continues to be seen in the emergency room frequently. Just this past weekend [facility], at: Urgent care and [facility].  He is seen often by our case worker and RN for blood pressure checks and emotional support. A caseworker has helped with setting up his med box to include the new medicines that were prescribed by [facility] -, prazosin, Doxepin and Effexor. Prozac and trazodone were discontinued by [facility].  Today patient arrives an hour late for his appointment. He was just seen in the emergency room this morning. He reports hydroxyzine does not help for his anxiety. He took the medicines given to him by [facility] a few times and stopped taking because they were making him feel shaky. "My body was very bad and they told me in the hospital that the medications was too strong for me." He is wanting lorazepam stating that that is the only medication that helps his anxiety Given Zoloft this weekend at one of his ER visits but no money to purchase Will see [facility] at [facility] in 4 wks for f/u. Patient Active Problem List   Diagnosis Date Noted  . H/O medication noncompliance 04/02/2017  . Vitamin D deficiency 04/02/2017  . DJD (degenerative joint disease) of cervical spine 03/16/2017  . 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 Dizziness 01/23/2017  . T wave inversion in EKG 11/21/2016  . Anxiety 11/21/2016     Current Outpatient Prescriptions on File Prior to Visit  Medication Sig Dispense Refill  . amLODipine (NORVASC) 5 MG tablet Take 1 tablet (5 mg total) by mouth daily. 90 tablet 3  . hydrochlorothiazide (HYDRODIURIL) 12.5 MG tablet Take  1 tablet (12.5 mg total) by mouth daily. 30 tablet 1  . meclizine (ANTIVERT) 25 MG tablet One po tid prn dizziness 30 tablet 0  . meloxicam (MOBIC) 15 MG tablet Take 1 tablet (15 mg total) by mouth daily. (Patient not taking: Reported on 03/23/2017) 30 tablet 0  . methocarbamol (ROBAXIN) 500 MG tablet Take 1 tablet (500 mg total) by mouth 2 (two) times daily. 20 tablet 0  . omeprazole (PRILOSEC) 20 MG capsule Take 1 capsule (20 mg total) by mouth daily. 30 capsule 0  . promethazine (PHENERGAN) 25 MG tablet Take 1 tablet (25 mg total) by mouth every 6 (six) hours as needed for nausea or vomiting. 30 tablet 0  . Vitamin D, Ergocalciferol, (DRISDOL) 50000 units CAPS capsule Take 1 capsule (50,000 Units total) by mouth every 7 (seven) days. (Patient not taking: Reported on 03/23/2017) 16 capsule 0   No current facility-administered medications on file prior to visit.     Allergies  Allergen Reactions  . Motrin [Ibuprofen] Other (See Comments)    Makes his throat get "very dry" after taking it  . Other Other (See Comments)    Steroids: Patient received a steroid shot from [facility] [phone] and it resulted in his muscles twitching all over his body  . Zofran [Ondansetron Hcl] Other (See Comments)    Causes sweating and body "feels badly"    Social History   Social History  . Marital status: Significant Other  Spouse name: N/A  . Number of children: N/A  . Years of education: N/A   Occupational History  . Not on file.   Social History Main Topics  . Smoking status: Never Smoker  . Smokeless tobacco: Never Used  . Alcohol use No     Comment: 11/21/2016 "last drank 06/2016"; denies   . Drug use: Yes     Comment: denies, reports past history   . Sexual activity: Not on file   Other Topics Concern  . Not on file   Social History Narrative  . No narrative on file    Family History  Problem Relation Age of Onset  . Hypertension Mother   . Hypertension Father       Past Surgical History:  Procedure Laterality Date  . EYE SURGERY Right ~ 1980   "hit my eye"    ROS: Review of Systems Negative except as stated above  PHYSICAL EXAM: BP 139/89   Pulse 95   Temp 98.6 F (37 C) (Oral)   Resp 16   Wt 184 lb 6.4 oz (83.6 kg)   SpO2 100%   BMI 29.76 kg/m   Physical Exam General appearance - alert, well appearing, and in no distress Mental status -pt seems a little stressed Chest - clear to auscultation, no wheezes, rales or rhonchi, symmetric air entry Heart - normal rate, regular rhythm, normal S1, S2, no murmurs, rubs, clicks or gallops Extremities - peripheral pulses normal, no pedal edema, no clubbing or cyanosis  Depression screen [facility] 2/9 02/21/2017 02/08/2017 02/02/2017 12/01/2016 11/27/2016  Decreased Interest 3 3 0 0 0  Down, Depressed, Hopeless 3 3 0 0 0  PHQ - 2 Score 6 6 0 0 0  Altered sleeping 3 2 0 - -  Tired, decreased energy 3 2 0 - -  Change in appetite 3 0 0 - -  Feeling bad or failure about yourself  3 3 0 - -  Trouble concentrating 3 3 0 - -  Moving slowly or fidgety/restless 3 0 0 - -  Suicidal thoughts 3 0 0 - -  PHQ-9 Score 27 16 0 - -   GAD 7 : Generalized Anxiety Score 02/21/2017 02/08/2017 02/02/2017 09/20/2016  Nervous, Anxious, on Edge 3 3 3 3   Control/stop worrying 0 0 3 0  Worry too much - different things 1 2 3 2   Trouble relaxing 3 3 3 3   Restless 3 0 3 0  Easily annoyed or irritable 1 2 3  0  Afraid - awful might happen 3 3 3  0  Total GAD 7 Score 14 13 21 8   Anxiety Difficulty - - Extremely difficult -     ASSESSMENT AND PLAN: 1. Anxiety disorder, unspecified type -I have made a pack with the patient to put him on lorazepam since he insists that this is the only medicine that works for him. He tells me that he only has to take the medicine once a day and it controls his anxiety well. -We will put him on the lorazepam with the understanding that we need to see a decrease in the frequency of ER visits. I  will have him follow-up with me in 1 wk and at that time he should not have had any ER visits between now and then. Patient is agreeable to this -I also requested that he at least start taking the Effexor that was prescribed by [facility]. Zoloft taken off of his list.  Stop hydroxyzine for now -Second patient was told  that moving forward he must show up for appointments on time. This is his third visitwith me where he  has showed up an hour late. -He will bring all medicines with him for his follow-up appointment -We think that he would be a good candidate for the ACT team however patient does not have insurance - LORazepam (ATIVAN) 0.5 MG tablet; Take 1 tablet (0.5 mg total) by mouth 2 (two) times daily as needed for anxiety.  Dispense: 40 tablet; Refill: 0  2. H/O medication noncompliance    Patient was given the opportunity to ask questions.  Patient verbalized understanding of the plan and was able to repeat key elements of the plan.   No orders of the defined types were placed in this encounter.    Requested Prescriptions   Signed Prescriptions Disp Refills  . LORazepam (ATIVAN) 0.5 MG tablet 40 tablet 0    Sig: Take 1 tablet (0.5 mg total) by mouth 2 (two) times daily as needed for anxiety.    Return in about 1 week (around 04/09/2017).  Jonah Blue, MD, FACP

## 2017-04-04 ENCOUNTER — Encounter (HOSPITAL_COMMUNITY): Payer: Self-pay | Admitting: Emergency Medicine

## 2017-04-04 ENCOUNTER — Emergency Department (HOSPITAL_COMMUNITY)
Admission: EM | Admit: 2017-04-04 | Discharge: 2017-04-04 | Disposition: A | Discharge status: Home / Self Care | Payer: Self-pay | Attending: Emergency Medicine | Admitting: Emergency Medicine

## 2017-04-04 DIAGNOSIS — Z5321 Procedure and treatment not carried out due to patient leaving prior to being seen by health care provider: Secondary | ICD-10-CM | POA: Insufficient documentation

## 2017-04-04 DIAGNOSIS — F419 Anxiety disorder, unspecified: Secondary | ICD-10-CM | POA: Insufficient documentation

## 2017-04-04 NOTE — ED Triage Notes (Signed)
Pt states that he felt like either his BP or anxiety was bothering him this morning at 0430. Took regular meds. Normotensive at this time. Alert and oriented.

## 2017-04-04 NOTE — ED Notes (Signed)
Pt was not found at 0940 when called for room. Called multiple times.

## 2017-04-04 NOTE — ED Notes (Signed)
Bed: WTR5 Expected date:  Expected time:  Means of arrival:  Comments: 

## 2017-04-07 ENCOUNTER — Emergency Department (HOSPITAL_COMMUNITY)
Admission: EM | Admit: 2017-04-07 | Discharge: 2017-04-07 | Disposition: A | Discharge status: Home / Self Care | Payer: Self-pay | Attending: Emergency Medicine | Admitting: Emergency Medicine

## 2017-04-07 ENCOUNTER — Encounter (HOSPITAL_COMMUNITY): Payer: Self-pay

## 2017-04-07 ENCOUNTER — Emergency Department (HOSPITAL_COMMUNITY): Payer: Self-pay

## 2017-04-07 DIAGNOSIS — R110 Nausea: Secondary | ICD-10-CM | POA: Insufficient documentation

## 2017-04-07 DIAGNOSIS — R1013 Epigastric pain: Secondary | ICD-10-CM | POA: Insufficient documentation

## 2017-04-07 DIAGNOSIS — I10 Essential (primary) hypertension: Secondary | ICD-10-CM | POA: Insufficient documentation

## 2017-04-07 DIAGNOSIS — Z79899 Other long term (current) drug therapy: Secondary | ICD-10-CM | POA: Insufficient documentation

## 2017-04-07 DIAGNOSIS — R079 Chest pain, unspecified: Secondary | ICD-10-CM | POA: Insufficient documentation

## 2017-04-07 LAB — BASIC METABOLIC PANEL
Anion gap: 9 (ref 5–15)
BUN: 10 mg/dL (ref 6–20)
CO2: 25 mmol/L (ref 22–32)
Calcium: 9.4 mg/dL (ref 8.9–10.3)
Chloride: 102 mmol/L (ref 101–111)
Creatinine, Ser: 0.71 mg/dL (ref 0.61–1.24)
GFR calc Af Amer: >60 mL/min (ref >60)
GFR calc non Af Amer: >60 mL/min (ref >60)
Glucose, Bld: 103 mg/dL — ABNORMAL HIGH (ref 65–99)
Potassium: 3.9 mmol/L (ref 3.5–5.1)
Sodium: 136 mmol/L (ref 135–145)

## 2017-04-07 LAB — CBC
HCT: 41.8 % (ref 39.0–52.0)
Hemoglobin: 14.1 g/dL (ref 13.0–17.0)
MCH: 27.5 pg (ref 26.0–34.0)
MCHC: 33.7 g/dL (ref 30.0–36.0)
MCV: 81.5 fL (ref 78.0–100.0)
Platelets: 289 10*3/uL (ref 150–400)
RBC: 5.13 MIL/uL (ref 4.22–5.81)
RDW: 13.7 % (ref 11.5–15.5)
WBC: 6.3 10*3/uL (ref 4.0–10.5)

## 2017-04-07 LAB — I-STAT TROPONIN, ED: Troponin i, poc: 0 ng/mL (ref 0.00–0.08)

## 2017-04-07 MED ORDER — GI COCKTAIL ~~LOC~~
30.0000 mL | Freq: Once | ORAL | Status: AC
  Administered 2017-04-07: 30 mL via ORAL
  Filled 2017-04-07: qty 30

## 2017-04-07 NOTE — ED Provider Notes (Signed)
[facility]-EMERGENCY DEPT Provider Note   CSN: [id number] Arrival date & time: 04/07/17  1125  By signing my name below, I, Wesley [name], attest that this documentation has been prepared under the direction and in the presence of Wesley [name], Stephen, MD. Electronically signed, Wesley [name], ED Scribe. 04/07/17. 12:27 PM.  History   Chief Complaint No chief complaint on file.   HPI HPI Comments: Wesley [name] is a 51 y.o. male who presents to the Emergency Department complaining of gradual onset, intermittent right sided chest pain that started approximately two hours ago. He also notes epigastric pain. Pt notes that the pain is exacerbated when taking a deep breath. Pt also notes associated nausea on the way to the ED. He reports that the pain intermittently radiates to his right shoulder. Pt felt short of breath on the way to the ED but is not currently. No treatments tried PTA. No Hx of stomach ulcers. No diaphoresis, cough.  The history is provided by the patient. No language interpreter was used.    Past Medical History:  Diagnosis Date  . Alcohol abuse   . Anxiety   . Chronic neck pain   . Cocaine abuse   . Hypertension     Patient Active Problem List   Diagnosis Date Noted  . H/O medication noncompliance 04/02/2017  . Vitamin D deficiency 04/02/2017  . DJD (degenerative joint disease) of cervical spine 03/16/2017  . 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 Dizziness 01/23/2017  . T wave inversion in EKG 11/21/2016  . Anxiety 11/21/2016    Past Surgical History:  Procedure Laterality Date  . EYE SURGERY Right ~ 1980   "hit my eye"       Home Medications    Prior to Admission medications   Medication Sig Start Date End Date Taking? Authorizing Provider  amLODipine (NORVASC) 5 MG tablet Take 1 tablet (5 mg total) by mouth daily. 02/21/17   Anders SimmondsMcClung, Angela M, PA-C  hydrochlorothiazide (HYDRODIURIL) 12.5 MG tablet Take 1 tablet (12.5 mg total) by mouth daily. 02/25/17   Elson AreasSofia, Leslie K, PA-C    LORazepam (ATIVAN) 0.5 MG tablet Take 1 tablet (0.5 mg total) by mouth 2 (two) times daily as needed for anxiety. 04/02/17   Marcine MatarJohnson, Deborah B, MD  meclizine (ANTIVERT) 25 MG tablet One po tid prn dizziness 04/01/17   Deatra Canterxford, William J, FNP  meloxicam (MOBIC) 15 MG tablet Take 1 tablet (15 mg total) by mouth daily. Patient not taking: Reported on 03/23/2017 03/22/17   Rolland PorterJames, Mark, MD  methocarbamol (ROBAXIN) 500 MG tablet Take 1 tablet (500 mg total) by mouth 2 (two) times daily. 04/01/17   Deatra Canterxford, William J, FNP  omeprazole (PRILOSEC) 20 MG capsule Take 1 capsule (20 mg total) by mouth daily. 03/21/17   Marcine MatarJohnson, Deborah B, MD  promethazine (PHENERGAN) 25 MG tablet Take 1 tablet (25 mg total) by mouth every 6 (six) hours as needed for nausea or vomiting. 03/28/17   Audry PiliMohr, Tyler, PA-C  Vitamin D, Ergocalciferol, (DRISDOL) 50000 units CAPS capsule Take 1 capsule (50,000 Units total) by mouth every 7 (seven) days. Patient not taking: Reported on 03/23/2017 03/22/17   Marcine MatarJohnson, Deborah B, MD    Family History Family History  Problem Relation Age of Onset  . Hypertension Mother   . Hypertension Father     Social History Social History  Substance Use Topics  . Smoking status: Never Smoker  . Smokeless tobacco: Never Used  . Alcohol use No     Comment: 11/21/2016 "last drank 06/2016";  denies     Allergies   Motrin [ibuprofen]; Other; and Zofran [ondansetron hcl]   Review of Systems Review of Systems A complete 10 system review of systems was obtained and all systems are negative except as noted in the HPI and PMH.   Physical Exam Updated Vital Signs Ht 5\' 6"  (1.676 m)   Wt 183 lb (83 kg)   BMI 29.54 kg/m   Physical Exam  Constitutional: He is oriented to person, place, and time. He appears well-developed and well-nourished.  HENT:  Head: Normocephalic and atraumatic.  Eyes: EOM are normal.  Neck: Normal range of motion.  Cardiovascular: Normal rate, regular rhythm, normal heart sounds  and intact distal pulses.   Pulmonary/Chest: Effort normal and breath sounds normal. No respiratory distress.  Abdominal: Soft. He exhibits no distension. There is tenderness.  Epigastric tenderness  Musculoskeletal: Normal range of motion. He exhibits tenderness.  Increased right shoulder pain with ROM  Neurological: He is alert and oriented to person, place, and time.  Skin: Skin is warm and dry.  Psychiatric: He has a normal mood and affect. Judgment normal.  Nursing note and vitals reviewed.   ED Treatments / Results  DIAGNOSTIC STUDIES: Oxygen Saturation is 100% on RA, normal by my interpretation.  COORDINATION OF CARE: 12:03 PM-Discussed treatment plan with pt at bedside and pt agreed to plan.   Labs (all labs ordered are listed, but only abnormal results are displayed) Labs Reviewed  BASIC METABOLIC PANEL - Abnormal; Notable for the following:       Result Value   Glucose, Bld 103 (*)    All other components within normal limits  CBC  I-STAT TROPONIN, ED    EKG  EKG Interpretation  Date/Time:  Saturday April 07 2017 11:36:02 EDT Ventricular Rate:  85 PR Interval:    QRS Duration: 97 QT Interval:  355 QTC Calculation: 423 R Axis:   -54 Text Interpretation:  Sinus rhythm Left anterior fascicular block Abnormal R-wave progression, late transition similar to previous from 04/02/17 Confirmed by Wesley [name], Stephen [id number]) on 04/07/2017 11:45:16 AM       Radiology No results found.  Procedures Procedures (including critical care time)  Medications Ordered in ED Medications - No data to display   Initial Impression / Assessment and Plan / ED Course  I have reviewed the triage vital signs and the nursing notes.  Pertinent labs & imaging results that were available during my care of the patient were reviewed by me and considered in my medical decision making (see chart for details).     Atypical cp. EKG w/o acute change. Likely anxiety component. It has been  determined that no acute conditions requiring further emergency intervention are present at this time. The patient has been advised of the diagnosis and plan. I reviewed any labs and imaging including any potential incidental findings. We have discussed signs and symptoms that warrant return to the ED and they are listed in the discharge instructions.    Final Clinical Impressions(s) / ED Diagnoses   Final diagnoses:  Chest pain, unspecified type    New Prescriptions New Prescriptions   No medications on file   I personally preformed the services scribed in my presence. The recorded information has been reviewed is accurate. Wesley RazorStephen Kohut, MD.    Wesley [name], Stephen, MD 04/15/17 [phone]

## 2017-04-07 NOTE — ED Triage Notes (Signed)
Patient c/o chest pain, sob and dizziness. Patient state pain doe not radiate to the arms. Pt rate pain at 7/10 at this time.

## 2017-04-08 DIAGNOSIS — I10 Essential (primary) hypertension: Secondary | ICD-10-CM | POA: Insufficient documentation

## 2017-04-08 DIAGNOSIS — F419 Anxiety disorder, unspecified: Secondary | ICD-10-CM | POA: Insufficient documentation

## 2017-04-08 DIAGNOSIS — Z79899 Other long term (current) drug therapy: Secondary | ICD-10-CM | POA: Insufficient documentation

## 2017-04-09 ENCOUNTER — Encounter (HOSPITAL_COMMUNITY): Payer: Self-pay | Admitting: Emergency Medicine

## 2017-04-09 ENCOUNTER — Emergency Department (HOSPITAL_COMMUNITY): Payer: Self-pay

## 2017-04-09 ENCOUNTER — Ambulatory Visit: Payer: Self-pay | Attending: Internal Medicine | Admitting: Internal Medicine

## 2017-04-09 ENCOUNTER — Encounter: Payer: Self-pay | Admitting: Internal Medicine

## 2017-04-09 ENCOUNTER — Emergency Department (HOSPITAL_COMMUNITY)
Admission: EM | Admit: 2017-04-09 | Discharge: 2017-04-09 | Disposition: A | Discharge status: Home / Self Care | Payer: Self-pay | Attending: Emergency Medicine | Admitting: Emergency Medicine

## 2017-04-09 ENCOUNTER — Telehealth: Payer: Self-pay | Admitting: Internal Medicine

## 2017-04-09 VITALS — BP 98/65 | HR 79 | Temp 98.2°F | Resp 18 | Ht 66.0 in | Wt 182.8 lb

## 2017-04-09 DIAGNOSIS — J9811 Atelectasis: Secondary | ICD-10-CM | POA: Insufficient documentation

## 2017-04-09 DIAGNOSIS — F410 Panic disorder [episodic paroxysmal anxiety] without agoraphobia: Secondary | ICD-10-CM | POA: Insufficient documentation

## 2017-04-09 DIAGNOSIS — Z888 Allergy status to other drugs, medicaments and biological substances status: Secondary | ICD-10-CM | POA: Insufficient documentation

## 2017-04-09 DIAGNOSIS — F419 Anxiety disorder, unspecified: Secondary | ICD-10-CM

## 2017-04-09 DIAGNOSIS — Z8249 Family history of ischemic heart disease and other diseases of the circulatory system: Secondary | ICD-10-CM | POA: Insufficient documentation

## 2017-04-09 DIAGNOSIS — E559 Vitamin D deficiency, unspecified: Secondary | ICD-10-CM | POA: Insufficient documentation

## 2017-04-09 DIAGNOSIS — Z886 Allergy status to analgesic agent status: Secondary | ICD-10-CM | POA: Insufficient documentation

## 2017-04-09 DIAGNOSIS — I10 Essential (primary) hypertension: Secondary | ICD-10-CM | POA: Insufficient documentation

## 2017-04-09 DIAGNOSIS — R631 Polydipsia: Secondary | ICD-10-CM | POA: Insufficient documentation

## 2017-04-09 DIAGNOSIS — Z765 Malingerer [conscious simulation]: Secondary | ICD-10-CM | POA: Insufficient documentation

## 2017-04-09 DIAGNOSIS — M5030 Other cervical disc degeneration, unspecified cervical region: Secondary | ICD-10-CM | POA: Insufficient documentation

## 2017-04-09 LAB — I-STAT TROPONIN, ED: Troponin i, poc: 0 ng/mL (ref 0.00–0.08)

## 2017-04-09 LAB — CBC
HCT: 41.5 % (ref 39.0–52.0)
Hemoglobin: 13.9 g/dL (ref 13.0–17.0)
MCH: 27.4 pg (ref 26.0–34.0)
MCHC: 33.5 g/dL (ref 30.0–36.0)
MCV: 81.7 fL (ref 78.0–100.0)
Platelets: 282 10*3/uL (ref 150–400)
RBC: 5.08 MIL/uL (ref 4.22–5.81)
RDW: 13.4 % (ref 11.5–15.5)
WBC: 8.4 10*3/uL (ref 4.0–10.5)

## 2017-04-09 LAB — BASIC METABOLIC PANEL
Anion gap: 11 (ref 5–15)
BUN: 7 mg/dL (ref 6–20)
CO2: 23 mmol/L (ref 22–32)
Calcium: 9.2 mg/dL (ref 8.9–10.3)
Chloride: 99 mmol/L — ABNORMAL LOW (ref 101–111)
Creatinine, Ser: 0.72 mg/dL (ref 0.61–1.24)
GFR calc Af Amer: >60 mL/min (ref >60)
GFR calc non Af Amer: >60 mL/min (ref >60)
Glucose, Bld: 115 mg/dL — ABNORMAL HIGH (ref 65–99)
Potassium: 3.7 mmol/L (ref 3.5–5.1)
Sodium: 133 mmol/L — ABNORMAL LOW (ref 135–145)

## 2017-04-09 NOTE — Progress Notes (Signed)
Patient is here for f/up HTN 

## 2017-04-09 NOTE — Telephone Encounter (Signed)
Call [facility] to [facility] [phone]) and left message for Phineas SemenLisa Duck in regards to getting more information on their ACT team for uninsured patients and the application process. Left office number on message and informed her that she can speak with Erskine SquibbJane (CM) as well.

## 2017-04-09 NOTE — ED Triage Notes (Signed)
Patient arrives with complaint of chest pain. States it woke him from sleep. Explains that he went to bed and felt okay. Woke about 1 hour ago feeling short of breath and nervous. States he is prone to anxiety and that he takes ativan, but it hasn't been helping recently. Endorses pounding in chest.

## 2017-04-09 NOTE — ED Provider Notes (Signed)
[facility]-EMERGENCY DEPT Provider Note   CSN: [id number] Arrival date & time: 04/08/17  2356     History   Chief Complaint Chief Complaint  Patient presents with  . Chest Pain  . Anxiety    HPI Wesley [name] is a 51 y.o. male.  Patient with a history of severe anxiety/panic presents with symptoms of heart racing, SOB, chest tightness that woke him from sleep. He took some of his anxiety medication - Ativan - was when he was not better in 10 minutes he came to the emergency department for evaluation. No nausea, vomiting. He complains of posterior neck pain but reports his palpitations are improved.    The history is provided by the patient. No language interpreter was used.  Chest Pain   Associated symptoms include shortness of breath. Pertinent negatives include no diaphoresis, no fever and no nausea.  Anxiety  Associated symptoms include shortness of breath.    Past Medical History:  Diagnosis Date  . Alcohol abuse   . Anxiety   . Chronic neck pain   . Cocaine abuse   . Hypertension     Patient Active Problem List   Diagnosis Date Noted  . H/O medication noncompliance 04/02/2017  . Vitamin D deficiency 04/02/2017  . DJD (degenerative joint disease) of cervical spine 03/16/2017  . 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 Dizziness 01/23/2017  . T wave inversion in EKG 11/21/2016  . Anxiety 11/21/2016    Past Surgical History:  Procedure Laterality Date  . EYE SURGERY Right ~ 1980   "hit my eye"       Home Medications    Prior to Admission medications   Medication Sig Start Date End Date Taking? Authorizing Provider  amLODipine (NORVASC) 5 MG tablet Take 1 tablet (5 mg total) by mouth daily. 02/21/17  Yes Georgian CoMcClung, Angela M, PA-C  hydrochlorothiazide (HYDRODIURIL) 12.5 MG tablet Take 1 tablet (12.5 mg total) by mouth daily. 02/25/17  Yes Cheron SchaumannSofia, Leslie K, PA-C  LORazepam (ATIVAN) 0.5 MG tablet Take 1 tablet (0.5 mg total) by mouth 2 (two) times daily as needed for anxiety.  04/02/17  Yes Marcine MatarJohnson, Deborah B, MD    Family History Family History  Problem Relation Age of Onset  . Hypertension Mother   . Hypertension Father     Social History Social History  Substance Use Topics  . Smoking status: Never Smoker  . Smokeless tobacco: Never Used  . Alcohol use No     Comment: 11/21/2016 "last drank 06/2016"; denies      Allergies   Motrin [ibuprofen]; Other; and Zofran [ondansetron hcl]   Review of Systems Review of Systems  Constitutional: Negative for chills, diaphoresis and fever.  Respiratory: Positive for chest tightness and shortness of breath.   Gastrointestinal: Negative.  Negative for nausea.  Musculoskeletal: Positive for neck pain (Chronic).  Skin: Negative.   Neurological: Negative.  Negative for light-headedness.     Physical Exam Updated Vital Signs BP 106/84   Pulse 80   Temp 98.3 F (36.8 C) (Oral)   Resp 16   SpO2 99%   Physical Exam  Constitutional: He is oriented to person, place, and time. He appears well-developed and well-nourished.  HENT:  Head: Normocephalic.  Neck: Normal range of motion. Neck supple.  Cardiovascular: Normal rate and regular rhythm.   No murmur heard. Pulmonary/Chest: Effort normal and breath sounds normal. He has no wheezes. He has no rales.  Abdominal: Soft. Bowel sounds are normal. There is no tenderness. There is no rebound and  no guarding.  Musculoskeletal: Normal range of motion.  Bilateral paracervical tenderness.   Neurological: He is alert and oriented to person, place, and time.  Skin: Skin is warm and dry. No rash noted.  Psychiatric: He has a normal mood and affect.     ED Treatments / Results  Labs (all labs ordered are listed, but only abnormal results are displayed) Labs Reviewed  BASIC METABOLIC PANEL - Abnormal; Notable for the following:       Result Value   Sodium 133 (*)    Chloride 99 (*)    Glucose, Bld 115 (*)    All other components within normal limits  CBC    I-STAT TROPONIN, ED    EKG  EKG Interpretation  Date/Time:  Monday April 09 2017 00:03:34 EDT Ventricular Rate:  117 PR Interval:  156 QRS Duration: 104 QT Interval:  330 QTC Calculation: 460 R Axis:   -55 Text Interpretation:  Sinus tachycardia Left anterior fascicular block Abnormal ECG When compared with ECG of 04/07/2017, No significant change was found Confirmed by Dione BoozeGlick, David ([id number]) on 04/09/2017 12:19:02 AM       Radiology Dg Chest 2 View  Result Date: 04/09/2017 CLINICAL DATA:  51 year old male with chest pain and shortness of breath. EXAM: CHEST  2 VIEW COMPARISON:  Chest radiograph dated 04/07/2017 FINDINGS: Shallow inspiration with minimal bibasilar atelectasis. No focal consolidation, pleural effusion, or pneumothorax. The cardiac silhouette is within normal limits. No acute osseous pathology. IMPRESSION: No active cardiopulmonary disease.  No interval change. Electronically Signed   By: Elgie CollardArash  Radparvar M.D.   On: 04/09/2017 01:04   Dg Chest 2 View  Result Date: 04/07/2017 CLINICAL DATA:  Patient with right-sided chest pain. EXAM: CHEST  2 VIEW COMPARISON:  Chest radiograph 04/02/2017 FINDINGS: Monitoring leads overlie the patient. Stable cardiac and mediastinal contours. Low lung volumes. Bibasilar heterogeneous pulmonary opacities. No pleural effusion or pneumothorax. Thoracic spine degenerative changes. IMPRESSION: Low lung volumes with basilar atelectasis. Electronically Signed   By: Annia Beltrew  Davis M.D.   On: 04/07/2017 12:18    Procedures Procedures (including critical care time)  Medications Ordered in ED Medications - No data to display   Initial Impression / Assessment and Plan / ED Course  I have reviewed the triage vital signs and the nursing notes.  Pertinent labs & imaging results that were available during my care of the patient were reviewed by me and considered in my medical decision making (see chart for details).     The patient who is well known  to the ED presents with symptoms typical of his anxiety/panic attacks. Ativan taken at home and symptoms improved here. He complains of neck pain that started after panic started and which is consistent with his usual neck pain.   Chart reviewed. Labs unremarkable.   He can be discharged home to keep his already scheduled appointment for later this morning.  Final Clinical Impressions(s) / ED Diagnoses   Final diagnoses:  Anxiety    New Prescriptions New Prescriptions   No medications on file     Danne HarborUpstill, Shari, PA-C 04/09/17 [id number]    Dione BoozeGlick, David, MD 04/09/17 [id number]

## 2017-04-09 NOTE — Patient Instructions (Signed)
Take Escitalopram once a day.  Stop checking your blood pressure.

## 2017-04-09 NOTE — Progress Notes (Signed)
Patient ID: Wesley [name], male    DOB: 26-May-1966  MRN: [id number]  CC: Hypertension   Subjective: Wesley [name] is a 51 y.o. male who presents for 1 wk f/u visit.  Significant other, Wesley [name], is with him. His concerns today include:  Patient with history of anxiety/panic disorder, HTN, chronic neck pain with degenerative changes on x-ray.  Since last visit with me patient has been seen in the emergency room 3 times. He was seen after office visit on the 23rd for shortness of breath and concern about his blood pressure. ED visit 7/28 for intermittent right CP. EKG unchanged. Chest x-ray revealing some atelectasis in the bases. ER visit this morning for anxiety/panic attack. Chest x-ray without acute disease. Troponin negative. Chemistry revealed low sodium of 133.  1. Anxiety; -On last visit we had agreed that with prescribing lorazepam he would discontinue going to the emergency room. -he reports lorazepam helps and has been taking twice a day as prescribed.  When he had anxiety attack early this morning he took lorazepam and called the ambulance before giving the medication time to work.  -Brought bottles of medicines with him today including prazosin, Effexor XR, hydroxyzine, Prozac and Lexapro. Not taking any of them -[facility]. Was not pleased because he spoke with the physician via Skype -Tried taking Lexapro again as discussed on last visit but "it causes my whole body to shake." Reports similar reaction with Prozac  -He is obsessed with taking his blood pressure which he reports is always high when he checks it  -Reportedly drinking water compulsively continuously through the day    Patient Active Problem List   Diagnosis Date Noted  . H/O medication noncompliance 04/02/2017  . Vitamin D deficiency 04/02/2017  . DJD (degenerative joint disease) of cervical spine 03/16/2017  . 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 Dizziness 01/23/2017  . T wave inversion in EKG 11/21/2016  .  Anxiety 11/21/2016     Current Outpatient Prescriptions on File Prior to Visit  Medication Sig Dispense Refill  . amLODipine (NORVASC) 5 MG tablet Take 1 tablet (5 mg total) by mouth daily. 90 tablet 3  . LORazepam (ATIVAN) 0.5 MG tablet Take 1 tablet (0.5 mg total) by mouth 2 (two) times daily as needed for anxiety. 40 tablet 0   No current facility-administered medications on file prior to visit.     Allergies  Allergen Reactions  . Motrin [Ibuprofen] Other (See Comments)    Makes his throat get "very dry" after taking it  . Other Other (See Comments)    Steroids: Patient received a steroid shot from [facility] [phone] and it resulted in his muscles twitching all over his body  . Zofran [Ondansetron Hcl] Other (See Comments)    Causes sweating and body "feels badly"    Social History   Social History  . Marital status: Significant Other    Spouse name: N/A  . Number of children: N/A  . Years of education: N/A   Occupational History  . Not on file.   Social History Main Topics  . Smoking status: Never Smoker  . Smokeless tobacco: Never Used  . Alcohol use No     Comment: 11/21/2016 "last drank 06/2016"; denies   . Drug use: Yes     Comment: denies, reports past history   . Sexual activity: Not on file   Other Topics Concern  . Not on file   Social History Narrative  . No narrative on  file    Family History  Problem Relation Age of Onset  . Hypertension Mother   . Hypertension Father     Past Surgical History:  Procedure Laterality Date  . EYE SURGERY Right ~ 1980   "hit my eye"    ROS: Review of Systems As above PHYSICAL EXAM: BP 98/65 (BP Location: Left Arm, Patient Position: Sitting, Cuff Size: Normal)   Pulse 79   Temp 98.2 F (36.8 C) (Oral)   Resp 18   Ht 5\' 6"  (1.676 m)   Wt 182 lb 12.8 oz (82.9 kg)   SpO2 98%   BMI 29.50 kg/m   Physical Exam General appearance - alert, well appearing, and in no distress Mental status  - alert, oriented to person, place, and time, normal mood, behavior, speech, dress, motor activity, and thought processes Neck - supple, no significant adenopathy Chest -few fince crackles LT base Heart - normal rate, regular rhythm, normal S1, S2, no murmurs, rubs, clicks or gallops Extremities - peripheral pulses normal, no pedal edema, no clubbing or cyanosis   ASSESSMENT AND PLAN: 1. Anxiety disorder, unspecified type -Patient informed that we are trying to work with him. However if he continues going to the ER several times a week despite taking the medication that he says helps him, we may not be able to do anything more for him. -Continue lorazepam 0.5 mg twice a day. -Advised to restart Lexapro 10 mg daily -Check urine drug screen today F/u in 1 wk - TSH - [id number] 11+Oxyco+Alc+Crt-Bund  2. Essential hypertension -Continue amlodipine Advised to stop checking his blood pressure  3. Polydipsia -Sodium level little low on BMP today. Rule out psychogenic polydipsia - Hemoglobin A1c  4. Atelectasis -Advised patient to take deep breaths several times a day for a minute. Try to get in some walks daily for 15-30 minutes  Patient was given the opportunity to ask questions.  Patient verbalized understanding of the plan and was able to repeat key elements of the plan.   Orders Placed This Encounter  Procedures  . TSH  . Hemoglobin A1c  . [id number] 11+Oxyco+Alc+Crt-Bund     Requested Prescriptions    No prescriptions requested or ordered in this encounter    Return in about 1 week (around 04/16/2017).  Jonah Blueeborah Johnson, MD, FACP

## 2017-04-10 LAB — HEMOGLOBIN A1C
Est. average glucose Bld gHb Est-mCnc: 126 mg/dL
Hgb A1c MFr Bld: 6 % — ABNORMAL HIGH (ref 4.8–5.6)

## 2017-04-10 LAB — DRUG SCREEN 764883 11+OXYCO+ALC+CRT-BUND
Amphetamines, Urine: NEGATIVE ng/mL
BENZODIAZ UR QL: NEGATIVE ng/mL
Barbiturate: NEGATIVE ng/mL
Cannabinoid Quant, Ur: NEGATIVE ng/mL
Cocaine (Metabolite): NEGATIVE ng/mL
Creatinine: 139 mg/dL (ref 20.0–300.0)
Ethanol: NEGATIVE %
Meperidine: NEGATIVE ng/mL
Methadone Screen, Urine: NEGATIVE ng/mL
OPIATE SCREEN URINE: NEGATIVE ng/mL
Oxycodone/Oxymorphone, Urine: NEGATIVE ng/mL
Phencyclidine: NEGATIVE ng/mL
Propoxyphene: NEGATIVE ng/mL
Tramadol: NEGATIVE ng/mL
pH, Urine: 7.3 (ref 4.5–8.9)

## 2017-04-10 LAB — TSH: TSH: 2.04 u[IU]/mL (ref 0.450–4.500)

## 2017-04-10 NOTE — Progress Notes (Signed)
Patient ID: Wesley [name], male   DOB: [birth year], 51 y.o.   MRN: [id number] Pt with normal TSH and A1C in pre-DM range. Will address on f/u visit.  Await UDS results

## 2017-04-11 ENCOUNTER — Ambulatory Visit: Payer: Self-pay | Attending: Internal Medicine | Admitting: Pharmacist

## 2017-04-11 ENCOUNTER — Telehealth: Payer: Self-pay

## 2017-04-11 VITALS — BP 125/86 | HR 101

## 2017-04-11 DIAGNOSIS — Z79899 Other long term (current) drug therapy: Secondary | ICD-10-CM | POA: Insufficient documentation

## 2017-04-11 DIAGNOSIS — I10 Essential (primary) hypertension: Secondary | ICD-10-CM | POA: Insufficient documentation

## 2017-04-11 NOTE — Patient Instructions (Signed)
Thanks for coming to see me  Your blood pressure looks great!  Lorazepam can make you feel dizzy but that is because it is a strong medication to help with your anxiety. Keep taking the lorazepam  You can take a multi-vitamin - it does not interact with any of your medications  Follow up with Dr. Laural BenesJohnson

## 2017-04-11 NOTE — Progress Notes (Signed)
   S:    Patient arrives in good spirits with his friend. Presents to the clinic for hypertension evaluation.   Patient reports adherence with medications.  Current BP Medications include:  Amlodipine 5 mg daily  Patient wants to know if a multi-vitamin would interact with his medications. He would also like to get rid of old medications that he is not taking anymore.  O:   Last 3 Office BP readings: BP Readings from Last 3 Encounters:  04/09/17 98/65  04/09/17 112/66  04/07/17 (!) 125/94    BMET    Component Value Date/Time   NA 133 (L) 04/09/2017 0032   K 3.7 04/09/2017 0032   CL 99 (L) 04/09/2017 0032   CO2 23 04/09/2017 0032   GLUCOSE 115 (H) 04/09/2017 0032   BUN 7 04/09/2017 0032   CREATININE 0.72 04/09/2017 0032   CALCIUM 9.2 04/09/2017 0032   GFRNONAA >60 04/09/2017 0032   GFRAA >60 04/09/2017 0032    A/P: Hypertension longstanding currently controlled on current medications.  Continued current medications as prescribed. Patient can take a multi-vitamin with his current medications. He will bring medications that he is not using to the clinic at his next visit to dispose of them.  Results reviewed and written information provided.   Total time in face-to-face counseling 5 minutes.   F/U Clinic Visit with Dr. Laural BenesJohnson as directed

## 2017-04-11 NOTE — Telephone Encounter (Signed)
Met with the patient when he was in the clinic to have his BP checked. He spoke to [facility] about taking a multi-vitamin with his current medications. He stated that he is taking the lorazepam twice a day and amlodipine daily but was not taking the escitalopram. Reminded him of the importance of taking all of the medications as ordered and he said that he understood.  He noted that he now has many extra medications and will bring them to [facility] for disposal.

## 2017-04-16 ENCOUNTER — Ambulatory Visit: Payer: Self-pay | Attending: Internal Medicine | Admitting: Internal Medicine

## 2017-04-16 ENCOUNTER — Encounter: Payer: Self-pay | Admitting: Internal Medicine

## 2017-04-16 VITALS — BP 133/87 | HR 78 | Temp 98.0°F | Resp 16 | Wt 183.0 lb

## 2017-04-16 DIAGNOSIS — Z1331 Encounter for screening for depression: Secondary | ICD-10-CM

## 2017-04-16 DIAGNOSIS — M47812 Spondylosis without myelopathy or radiculopathy, cervical region: Secondary | ICD-10-CM

## 2017-04-16 DIAGNOSIS — Z9114 Patient's other noncompliance with medication regimen: Secondary | ICD-10-CM | POA: Insufficient documentation

## 2017-04-16 DIAGNOSIS — F419 Anxiety disorder, unspecified: Secondary | ICD-10-CM

## 2017-04-16 DIAGNOSIS — Z765 Malingerer [conscious simulation]: Secondary | ICD-10-CM | POA: Insufficient documentation

## 2017-04-16 DIAGNOSIS — E559 Vitamin D deficiency, unspecified: Secondary | ICD-10-CM | POA: Insufficient documentation

## 2017-04-16 DIAGNOSIS — R7303 Prediabetes: Secondary | ICD-10-CM

## 2017-04-16 DIAGNOSIS — I10 Essential (primary) hypertension: Secondary | ICD-10-CM

## 2017-04-16 DIAGNOSIS — Z1389 Encounter for screening for other disorder: Secondary | ICD-10-CM

## 2017-04-16 MED ORDER — LORAZEPAM 0.5 MG PO TABS: 0.5000 mg | ORAL_TABLET | Freq: Two times a day (BID) | ORAL | 0 refills | Status: DC | PRN

## 2017-04-16 MED ORDER — DICLOFENAC SODIUM 1 % TD GEL: 2.0000 g | Freq: Four times a day (QID) | TRANSDERMAL | 1 refills | Status: DC

## 2017-04-16 MED ORDER — METFORMIN HCL 500 MG PO TABS: 500.0000 mg | ORAL_TABLET | Freq: Two times a day (BID) | ORAL | 3 refills | Status: DC

## 2017-04-16 MED FILL — AMLODIPINE BESYLATE 5 MG TA: 5 | 30 days supply | Qty: 30 | Fill #1

## 2017-04-16 MED FILL — ?METFORMIN HCL 500MG TABLET: 500 | 15 days supply | Qty: 30 | Fill #0

## 2017-04-16 MED FILL — VOLTAREN 1% GEL: 1 | 12 days supply | Qty: 100 | Fill #0

## 2017-04-16 NOTE — Patient Instructions (Addendum)
Take the Escitalopram daily in the mornings with your morning dose of Lorazepm.

## 2017-04-16 NOTE — Progress Notes (Signed)
Patient ID: Wesley [name], male    DOB: 1966-03-21  MRN: [id number]  CC: anxiety  Subjective:  Wesley [name] is a 51 y.o. male who presents for 1 wk f/u on anxiety disorder and HTN.  Girlfriend is with him. His concerns today include:  1.  Anxiety: "Anxiety has dec a lot with the medicine." -no ED visits since last visit 1 wk ago -goes to bed around 10 p.m and wakes up around 4 a.m.  Problems going back to sleep.  Sits up and reads his bible then takes the Lorazepam at 7 a.m -UDS was negative and TSH was normal on recent visit -still has 6.5 days left of Lorazepam and has bottle with him.  Reportedly taking the Lexapro once a day at 1 p.m -denies any feelings of depression even thought PHQ 9 is +  2.  A1C was in range for preDM - no Fhx of DM -endorses frequent thirst and urination. No dysuria  3. Vit D def -has about 4 high dose vit D tablet left  4. Pain in posterior neck -intermittent.  Usually in a.m or middle of nights.  Attributes this to disturbing his  sleep at nights -"when I move my neck, I feel like my nerves move; I feel like the muscle is jittery."  -no radiation to arms or hands  5. HTN: compliant with Norvasc and has bottle with him today.  Also has bottle of HCTZ in his pocket but states he is not taking  Patient Active Problem List   Diagnosis Date Noted  . H/O medication noncompliance 04/02/2017  . Vitamin D deficiency 04/02/2017  . DJD (degenerative joint disease) of cervical spine 03/16/2017  . 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 Dizziness 01/23/2017  . T wave inversion in EKG 11/21/2016  . Anxiety 11/21/2016     Current Outpatient Prescriptions on File Prior to Visit  Medication Sig Dispense Refill  . amLODipine (NORVASC) 5 MG tablet Take 1 tablet (5 mg total) by mouth daily. 90 tablet 3  . escitalopram (LEXAPRO) 10 MG tablet Take 10 mg by mouth daily.    Marland Kitchen LORazepam (ATIVAN) 0.5 MG tablet Take 1 tablet (0.5 mg total) by mouth 2 (two) times daily as needed for  anxiety. 40 tablet 0   No current facility-administered medications on file prior to visit.     Allergies  Allergen Reactions  . Motrin [Ibuprofen] Other (See Comments)    Makes his throat get "very dry" after taking it  . Other Other (See Comments)    Steroids: Patient received a steroid shot from [facility] [phone] and it resulted in his muscles twitching all over his body  . Zofran [Ondansetron Hcl] Other (See Comments)    Causes sweating and body "feels badly"     ROS: Review of Systems Neg except as stated above  PHYSICAL EXAM: BP 133/87   Pulse 78   Temp 98 F (36.7 C) (Oral)   Resp 16   Wt 183 lb (83 kg)   SpO2 97%   BMI 29.54 kg/m   Physical Exam  General appearance - alert, well appearing, middle age male  and in no distress Mental status - alert, oriented to person, place, and time, normal mood, behavior, speech, dress, motor activity, and thought processes Neck - supple, no LN Chest - clear to auscultation, no wheezes, rales or rhonchi, symmetric air entry Heart - normal rate, regular rhythm, normal S1, S2, no murmurs, rubs, clicks or gallops Musculoskeletal - mild tenderness  on palpation oc C-spine throughout and trapezius muscle on both sides Extremities - peripheral pulses normal, no pedal edema, no clubbing or cyanosis  Depression screen PHQ 2/9 04/16/2017  Decreased Interest 1  Down, Depressed, Hopeless 3  PHQ - 2 Score 4  Altered sleeping 3  Tired, decreased energy 3  Change in appetite 1  Feeling bad or failure about yourself  2  Trouble concentrating 2  Moving slowly or fidgety/restless 2  Suicidal thoughts 0  PHQ-9 Score 17   GAD 7 : Generalized Anxiety Score 04/16/2017 02/21/2017 02/08/2017 02/02/2017  Nervous, Anxious, on Edge 3 3 3 3   Control/stop worrying 2 0 0 3  Worry too much - different things 3 1 2 3   Trouble relaxing 3 3 3 3   Restless 2 3 0 3  Easily annoyed or irritable 0 1 2 3   Afraid - awful might happen 2 3 3 3     Total GAD 7 Score 15 14 13 21   Anxiety Difficulty - - - Extremely difficult   Results for orders placed or performed in visit on 04/09/17  TSH  Result Value Ref Range   TSH 2.040 0.450 - 4.500 uIU/mL  Hemoglobin A1c  Result Value Ref Range   Hgb A1c MFr Bld 6.0 (H) 4.8 - 5.6 %   Est. average glucose Bld gHb Est-mCnc 126 mg/dL  [id number]+VWUJW+JXB+JYN-[id number]+Oxyco+Alc+Crt-Bund  Result Value Ref Range   Ethanol Negative Cutoff=0.020 %   Amphetamines, Urine Negative Cutoff=1000 ng/mL   Barbiturate Negative Cutoff=200 ng/mL   BENZODIAZ UR QL Negative Cutoff=200 ng/mL   Cannabinoid Quant, Ur Negative Cutoff=50 ng/mL   Cocaine (Metabolite) Negative Cutoff=300 ng/mL   OPIATE SCREEN URINE Negative Cutoff=300 ng/mL   OXYCODONE+OXYMORPHONE UR QL SCN Negative Cutoff=300 ng/mL   Phencyclidine Negative Cutoff=25 ng/mL   Methadone Screen, Urine Negative Cutoff=300 ng/mL   Propoxyphene Negative Cutoff=300 ng/mL   Meperidine Negative Cutoff=200 ng/mL   Tramadol Negative Cutoff=200 ng/mL   Creatinine 139.0 20.0 - 300.0 mg/dL   PH OF URINE 7.3 4.5 - 8.9      ASSESSMENT AND PLAN: 1. Anxiety disorder, unspecified type -doing better. Praised him for no ED visits over past 1 wk. Continue to encourage him to be seen here with case worker, clinical pharmacist or myself as need arises -predated Lorazepam rxn given to be filled on or after 8/12/208 -advised to take the Lexapro either in a.m or p.m with his regular dose of Lorazepam at that time.  I want him to bring bottle on next visit to show me that he is taking it -f/u in 1 wk - LORazepam (ATIVAN) 0.5 MG tablet; Take 1 tablet (0.5 mg total) by mouth 2 (two) times daily as needed for anxiety. To be filled on on after 04/22/2017  Dispense: 60 tablet; Refill: 0  2. Essential hypertension -at goal  3. Prediabetes -discussed dx and how to avoid progression. Encourage regular aerobic exercise 3-4 times a week for 30 minutes Discussed needed dietary changes including  cutting back on white carbohydrates, eliminating sweet snacks and sugary drinks Will put him on low-dose metformin - metFORMIN (GLUCOPHAGE) 500 MG tablet; Take 1 tablet (500 mg total) by mouth 2 (two) times daily with a meal.  Dispense: 30 tablet; Refill: 3  4. Spondylosis of cervical region without myelopathy or radiculopathy - diclofenac sodium (VOLTAREN) 1 % GEL; Apply 2 g topically 4 (four) times daily.  Dispense: 100 g; Refill: 1 5.  Pos Dep screen -pt denies any feelings of depression at this time  Patient was given the opportunity to ask questions.  Patient verbalized understanding of the plan and was able to repeat key elements of the plan.  [employer] interpreter used during this encounter.  No orders of the defined types were placed in this encounter.    Requested Prescriptions   Signed Prescriptions Disp Refills  . metFORMIN (GLUCOPHAGE) 500 MG tablet 30 tablet 3    Sig: Take 1 tablet (500 mg total) by mouth 2 (two) times daily with a meal.  . diclofenac sodium (VOLTAREN) 1 % GEL 100 g 1    Sig: Apply 2 g topically 4 (four) times daily.  Marland Kitchen LORazepam (ATIVAN) 0.5 MG tablet 60 tablet 0    Sig: Take 1 tablet (0.5 mg total) by mouth 2 (two) times daily as needed for anxiety. To be filled on on after 04/22/2017    Future Appointments Date Time Provider Department Center  06/29/2017 2:00 PM Glendale Chard, DO [facility] None    Jonah Blue, MD, Jerrel Ivory

## 2017-04-17 ENCOUNTER — Telehealth: Payer: Self-pay | Admitting: Internal Medicine

## 2017-04-17 NOTE — Telephone Encounter (Signed)
Call placed to #[phone] [facility] Mixer([facility]), and spoke with Konrad DoloresKathy Connell regarding ACCT programs for patient. Olegario MessierKathy informed that unfortunately patient is not eligible for ACCT. Patient will need to [facility] to see if he qualifies for Medicaid and if he doesn't he can apply for outpatient services. These services are state funded and will help patient receive services such as employment and peer support.

## 2017-04-18 ENCOUNTER — Ambulatory Visit (HOSPITAL_COMMUNITY)
Admission: AD | Admit: 2017-04-18 | Discharge: 2017-04-18 | Disposition: A | Discharge status: Home / Self Care | Payer: Self-pay | Attending: Internal Medicine | Admitting: Internal Medicine

## 2017-04-18 DIAGNOSIS — E119 Type 2 diabetes mellitus without complications: Secondary | ICD-10-CM

## 2017-04-18 NOTE — ED Notes (Signed)
See downtime documentation 

## 2017-04-19 LAB — GLUCOSE, CAPILLARY: Glucose-Capillary: 95 mg/dL (ref 65–99)

## 2017-04-20 ENCOUNTER — Encounter (HOSPITAL_COMMUNITY): Payer: Self-pay | Admitting: Emergency Medicine

## 2017-04-20 ENCOUNTER — Emergency Department (HOSPITAL_COMMUNITY)
Admission: EM | Admit: 2017-04-20 | Discharge: 2017-04-20 | Disposition: A | Discharge status: Home / Self Care | Payer: Self-pay | Attending: Emergency Medicine | Admitting: Emergency Medicine

## 2017-04-20 DIAGNOSIS — R42 Dizziness and giddiness: Secondary | ICD-10-CM | POA: Insufficient documentation

## 2017-04-20 LAB — BASIC METABOLIC PANEL
Anion gap: 7 (ref 5–15)
BUN: 9 mg/dL (ref 6–20)
CO2: 26 mmol/L (ref 22–32)
Calcium: 9.2 mg/dL (ref 8.9–10.3)
Chloride: 104 mmol/L (ref 101–111)
Creatinine, Ser: 0.79 mg/dL (ref 0.61–1.24)
GFR calc Af Amer: >60 mL/min (ref >60)
GFR calc non Af Amer: >60 mL/min (ref >60)
Glucose, Bld: 102 mg/dL — ABNORMAL HIGH (ref 65–99)
Potassium: 3.9 mmol/L (ref 3.5–5.1)
Sodium: 137 mmol/L (ref 135–145)

## 2017-04-20 LAB — CBC
HCT: 41.2 % (ref 39.0–52.0)
Hemoglobin: 13.5 g/dL (ref 13.0–17.0)
MCH: 27.2 pg (ref 26.0–34.0)
MCHC: 32.8 g/dL (ref 30.0–36.0)
MCV: 82.9 fL (ref 78.0–100.0)
Platelets: 244 10*3/uL (ref 150–400)
RBC: 4.97 MIL/uL (ref 4.22–5.81)
RDW: 13.7 % (ref 11.5–15.5)
WBC: 5.9 10*3/uL (ref 4.0–10.5)

## 2017-04-20 NOTE — ED Notes (Signed)
Bed: WTR9 Expected date:  Expected time:  Means of arrival:  Comments: 

## 2017-04-20 NOTE — ED Notes (Signed)
Called for room x 2.  Not in lobby

## 2017-04-20 NOTE — ED Triage Notes (Signed)
Pt reports he began to feel lightheadedness about an hour ago. Hx of anxiety and DM. DM medication was recently adjusted. No SOB or CP.

## 2017-04-21 ENCOUNTER — Encounter (HOSPITAL_COMMUNITY): Payer: Self-pay | Admitting: Emergency Medicine

## 2017-04-21 ENCOUNTER — Ambulatory Visit (HOSPITAL_COMMUNITY)
Admission: EM | Admit: 2017-04-21 | Discharge: 2017-04-21 | Disposition: A | Discharge status: Home / Self Care | Payer: Self-pay | Attending: Family Medicine | Admitting: Family Medicine

## 2017-04-21 DIAGNOSIS — M791 Myalgia, unspecified site: Secondary | ICD-10-CM

## 2017-04-21 DIAGNOSIS — F411 Generalized anxiety disorder: Secondary | ICD-10-CM

## 2017-04-21 DIAGNOSIS — S161XXA Strain of muscle, fascia and tendon at neck level, initial encounter: Secondary | ICD-10-CM

## 2017-04-21 MED ORDER — PREDNISONE 10 MG PO TABS: 10.0000 mg | ORAL_TABLET | Freq: Every day | ORAL | 0 refills | Status: DC

## 2017-04-21 MED ORDER — BUSPIRONE HCL 5 MG PO TABS: 5.0000 mg | ORAL_TABLET | Freq: Three times a day (TID) | ORAL | 2 refills | Status: DC

## 2017-04-21 NOTE — ED Triage Notes (Signed)
Neck pain and left arm pain, upper back pain.  No injury.  Movement makes pain worse

## 2017-04-21 NOTE — Discharge Instructions (Signed)
Please see your private doctor in the next 2 weeks to review your medications for anxiety.

## 2017-04-21 NOTE — ED Provider Notes (Signed)
[facility]    CSN: [id number] Arrival date & time: 04/21/17  1321     History   Chief Complaint Chief Complaint  Patient presents with  . Neck Pain    HPI Wesley [name] is a 51 y.o. male.   This is a 51 year old Hispanic man with history of anxiety who presents for evaluation of neck pain. He was [facility] and blood work was performed along with an EKG. Patient did not wait for the results before leaving the facility.  Patient works as a Education administratorpainter. He is currently taking lorazepam for anxiety but finds that this does not stop his muscles from trembling when he is working. This been an ongoing issue for him.  Patient notes that the area of discomfort in his back is located bilaterally at the lower aspect of his paracervical spine.      Past Medical History:  Diagnosis Date  . Alcohol abuse   . Anxiety   . Anxiety   . Chronic neck pain   . Cocaine abuse   . Diabetes mellitus without complication (HCC)   . Hypertension     Patient Active Problem List   Diagnosis Date Noted  . Essential hypertension 04/16/2017  . H/O medication noncompliance 04/02/2017  . Vitamin D deficiency 04/02/2017  . DJD (degenerative joint disease) of cervical spine 03/16/2017  . 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 Dizziness 01/23/2017  . Anxiety 11/21/2016    Past Surgical History:  Procedure Laterality Date  . EYE SURGERY Right ~ 1980   "hit my eye"       Home Medications    Prior to Admission medications   Medication Sig Start Date End Date Taking? Authorizing Provider  amLODipine (NORVASC) 5 MG tablet Take 1 tablet (5 mg total) by mouth daily. 02/21/17   Anders SimmondsMcClung, Angela M, PA-C  busPIRone (BUSPAR) 5 MG tablet Take 1 tablet (5 mg total) by mouth 3 (three) times daily. 04/21/17   Elvina SidleLauenstein, Kayron Kalmar, MD  metFORMIN (GLUCOPHAGE) 500 MG tablet Take 1 tablet (500 mg total) by mouth 2 (two) times daily with a meal. 04/16/17   Marcine MatarJohnson, Deborah  B, MD  predniSONE (DELTASONE) 10 MG tablet Take 1 tablet (10 mg total) by mouth daily with breakfast. 04/21/17   Elvina SidleLauenstein, Justina Bertini, MD    Family History Family History  Problem Relation Age of Onset  . Hypertension Mother   . Hypertension Father     Social History Social History  Substance Use Topics  . Smoking status: Never Smoker  . Smokeless tobacco: Never Used  . Alcohol use No     Comment: 11/21/2016 "last drank 06/2016"; denies      Allergies   Motrin [ibuprofen]; Other; and Zofran [ondansetron hcl]   Review of Systems Review of Systems  Musculoskeletal: Positive for back pain.  Psychiatric/Behavioral: Positive for agitation.  All other systems reviewed and are negative.    Physical Exam Triage Vital Signs ED Triage Vitals  Enc Vitals Group     BP 04/21/17 1344 126/89     Pulse Rate 04/21/17 1344 86     Resp 04/21/17 1344 18     Temp 04/21/17 1344 97.7 F (36.5 C)     Temp Source 04/21/17 1344 Oral     SpO2 04/21/17 1344 96 %     Weight --      Height --      Head Circumference --      Peak Flow --  Pain Score 04/21/17 1342 8     Pain Loc --      Pain Edu? --      Excl. in GC? --    No data found.   Updated Vital Signs BP 126/89 (BP Location: Left Arm)   Pulse 86   Temp 97.7 F (36.5 C) (Oral)   Resp 18   SpO2 96%    Physical Exam  Constitutional: He is oriented to person, place, and time. He appears well-developed and well-nourished.  HENT:  Right Ear: External ear normal.  Left Ear: External ear normal.  Eyes: Pupils are equal, round, and reactive to light. Conjunctivae are normal.  Neck: Normal range of motion. Neck supple.  Cardiovascular: Normal rate, regular rhythm and normal heart sounds.   Pulmonary/Chest: Effort normal and breath sounds normal.  Musculoskeletal: Normal range of motion.  Neurological: He is alert and oriented to person, place, and time.  Skin: Skin is warm and dry.  Psychiatric: He has a normal mood and  affect. His behavior is normal.  Nursing note and vitals reviewed.  Although patient seems somewhat anxious, he has good eye contact and seems to understand his anxiety problem  UC Treatments / Results  Labs (all labs ordered are listed, but only abnormal results are displayed) Labs Reviewed - No data to display Labs from yesterday were reviewed EKG  EKG Interpretation None     EKG from yesterday reviewed and shows left anterior hemi-block  Radiology No results found.  Procedures Procedures (including critical care time)  Medications Ordered in UC Medications - No data to display   Initial Impression / Assessment and Plan / UC Course  I have reviewed the triage vital signs and the nursing notes.  Pertinent labs & imaging results that were available during my care of the patient were reviewed by me and considered in my medical decision making (see chart for details).     Final Clinical Impressions(s) / UC Diagnoses   Final diagnoses:  Generalized anxiety disorder  Myalgia  Strain of neck muscle, initial encounter    New Prescriptions New Prescriptions   BUSPIRONE (BUSPAR) 5 MG TABLET    Take 1 tablet (5 mg total) by mouth 3 (three) times daily.   PREDNISONE (DELTASONE) 10 MG TABLET    Take 1 tablet (10 mg total) by mouth daily with breakfast.     Controlled Substance Prescriptions [place] Controlled Substance Registry consulted? Not Applicable   Elvina Sidle, MD 04/21/17 1404

## 2017-04-23 ENCOUNTER — Ambulatory Visit: Payer: Self-pay | Attending: Internal Medicine | Admitting: Internal Medicine

## 2017-04-23 ENCOUNTER — Encounter: Payer: Self-pay | Admitting: Internal Medicine

## 2017-04-23 VITALS — BP 122/82 | HR 79 | Temp 98.5°F | Resp 16 | Wt 184.4 lb

## 2017-04-23 DIAGNOSIS — R42 Dizziness and giddiness: Secondary | ICD-10-CM | POA: Insufficient documentation

## 2017-04-23 DIAGNOSIS — Z79899 Other long term (current) drug therapy: Secondary | ICD-10-CM | POA: Insufficient documentation

## 2017-04-23 DIAGNOSIS — M47812 Spondylosis without myelopathy or radiculopathy, cervical region: Secondary | ICD-10-CM | POA: Insufficient documentation

## 2017-04-23 DIAGNOSIS — R7303 Prediabetes: Secondary | ICD-10-CM | POA: Insufficient documentation

## 2017-04-23 DIAGNOSIS — I10 Essential (primary) hypertension: Secondary | ICD-10-CM | POA: Insufficient documentation

## 2017-04-23 DIAGNOSIS — M542 Cervicalgia: Secondary | ICD-10-CM | POA: Insufficient documentation

## 2017-04-23 DIAGNOSIS — Z9114 Patient's other noncompliance with medication regimen: Secondary | ICD-10-CM | POA: Insufficient documentation

## 2017-04-23 DIAGNOSIS — M62838 Other muscle spasm: Secondary | ICD-10-CM | POA: Insufficient documentation

## 2017-04-23 DIAGNOSIS — B353 Tinea pedis: Secondary | ICD-10-CM | POA: Insufficient documentation

## 2017-04-23 DIAGNOSIS — E559 Vitamin D deficiency, unspecified: Secondary | ICD-10-CM | POA: Insufficient documentation

## 2017-04-23 DIAGNOSIS — F419 Anxiety disorder, unspecified: Secondary | ICD-10-CM | POA: Insufficient documentation

## 2017-04-23 DIAGNOSIS — Z765 Malingerer [conscious simulation]: Secondary | ICD-10-CM | POA: Insufficient documentation

## 2017-04-23 DIAGNOSIS — Z7984 Long term (current) use of oral hypoglycemic drugs: Secondary | ICD-10-CM | POA: Insufficient documentation

## 2017-04-23 MED ORDER — TERBINAFINE HCL 1 % EX CREA: 1.0000 "application " | TOPICAL_CREAM | Freq: Two times a day (BID) | CUTANEOUS | 0 refills | Status: DC

## 2017-04-23 MED ORDER — MELOXICAM 15 MG PO TABS: 15.0000 mg | ORAL_TABLET | Freq: Every day | ORAL | 0 refills | Status: DC

## 2017-04-23 MED ORDER — METHOCARBAMOL 500 MG PO TABS: 500.0000 mg | ORAL_TABLET | Freq: Two times a day (BID) | ORAL | 1 refills | Status: DC | PRN

## 2017-04-23 MED FILL — ?MELOXICAM 15MG TABLET: 15 | 30 days supply | Qty: 30 | Fill #0

## 2017-04-23 NOTE — Progress Notes (Signed)
Patient ID: Wesley [name], male    DOB: 1966/06/15  MRN: [id number]  CC: Follow-up   Subjective: Wesley LitterJose Shostak is a 51 y.o. male who presents for 1 wk f/u visit. Male friend is with him His concerns today include:  Pt with hx of anxiety disorder, HTN, vit d def, pre-DM and spondylosis of C-spine  1. Went to ER 04/20/2017 because he felt SOB and was sweating. Left before being seen. -reports compliance with Lorazepam which he feels helps  2. 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 He has not had funds to fill rxn -[facility] his neck.  Last seen about 1 wk ago.  Referred  to P.T - [facility] Misty StanleyLisa Cottrill [phone]). Has appt this Thursday at 3 p.m   3. Still worries about his BP being too low or too high.   Lately he feels it is too low and wonders if Norvasc is too strong BP here, at ER and recent UC visits are good.   4.  C/o having athlete's foot b/w toes.  Patient Active Problem List   Diagnosis Date Noted  . Prediabetes 04/23/2017  . Muscle spasm 04/23/2017  . Essential hypertension 04/16/2017  . H/O medication noncompliance 04/02/2017  . Vitamin D deficiency 04/02/2017  . DJD (degenerative joint disease) of cervical spine 03/16/2017  . 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 Dizziness 01/23/2017  . Anxiety 11/21/2016     Current Outpatient Prescriptions on File Prior to Visit  Medication Sig Dispense Refill  . amLODipine (NORVASC) 5 MG tablet Take 1 tablet (5 mg total) by mouth daily. 90 tablet 3  . metFORMIN (GLUCOPHAGE) 500 MG tablet Take 1 tablet (500 mg total) by mouth  2 (two) times daily with a meal. 30 tablet 3   No current facility-administered medications on file prior to visit.     Allergies  Allergen Reactions  . Motrin [Ibuprofen] Other (See Comments)    Makes his throat get "very dry" after taking it  . Other Other (See Comments)    Steroids: Patient received a steroid shot from [facility] [phone] and it resulted in his muscles twitching all over his body  . Zofran [Ondansetron Hcl] Other (See Comments)    Causes sweating and body "feels badly"    Social History   Social History  . Marital status: Significant Other    Spouse name: N/A  . Number of children: N/A  . Years of education: N/A   Occupational History  . Not on file.   Social History Main Topics  . Smoking status: Never Smoker  . Smokeless tobacco: Never Used  . Alcohol use No     Comment: 11/21/2016 "last drank 06/2016"; denies   . Drug use: Yes     Comment: denies, reports past history   . Sexual activity: Not on file   Other Topics Concern  . Not on file   Social History Narrative  . No narrative on file    Family History  Problem Relation Age of Onset  .  Hypertension Mother   . Hypertension Father     Past Surgical History:  Procedure Laterality Date  . EYE SURGERY Right ~ 1980   "hit my eye"    ROS: Review of Systems Neg except as stated above  PHYSICAL EXAM: BP 122/82   Pulse 79   Temp 98.5 F (36.9 C) (Oral)   Resp 16   Wt 184 lb 6.4 oz (83.6 kg)   SpO2 96%   BMI 29.76 kg/m   Physical Exam General appearance - alert, well appearing, and in no distress Mental status - alert, oriented to person, place, and time. Poor eye contact Neck - supple, no significant adenopathy Chest - clear to auscultation, no wheezes, rales or rhonchi, symmetric air entry Heart - normal rate, regular rhythm, normal S1, S2, no murmurs, rubs, clicks or gallops Skin - hypopigmentation with moisture b/w toes  ASSESSMENT AND PLAN: 1. Anxiety  disorder, unspecified type -commended him on trying to stay out of ED for non-emergency matters. Reminded him that may times in past he was seen in ER for SOB caused by anxiety -discussed and encouraged deep breathing techniques.  Be seen here in our office rather than ED -he will not fill rxn for Buspar.   2. Essential hypertension -reassurance given that BP is ok. Continue Norvasc at current dose  3. Spondylosis of cervical region without myelopathy or radiculopathy 4. Muscle spasm -pt has known arthritis changes in c-spine. Sounds like he is having muscle spasms in trapezius muscles when actively using upper arms above head. Will try him with Robaxin to use before such activities and daily Mobic - meloxicam (MOBIC) 15 MG tablet; Take 1 tablet (15 mg total) by mouth daily.  Dispense: 30 tablet; Refill: 0 - methocarbamol (ROBAXIN) 500 MG tablet; Take 1 tablet (500 mg total) by mouth 2 (two) times daily as needed for muscle spasms.  Dispense: 30 tablet; Refill: 1  5. Tinea pedis of both feet -advised to keep feet clean and dry - terbinafine (LAMISIL AT) 1 % cream; Apply 1 application topically 2 (two) times daily.  Dispense: 30 g; Refill: 0  Patient was given the opportunity to ask questions.  Patient verbalized understanding of the plan and was able to repeat key elements of the plan.   No orders of the defined types were placed in this encounter.    Requested Prescriptions   Signed Prescriptions Disp Refills  . terbinafine (LAMISIL AT) 1 % cream 30 g 0    Sig: Apply 1 application topically 2 (two) times daily.  . meloxicam (MOBIC) 15 MG tablet 30 tablet 0    Sig: Take 1 tablet (15 mg total) by mouth daily.  . methocarbamol (ROBAXIN) 500 MG tablet 30 tablet 1    Sig: Take 1 tablet (500 mg total) by mouth 2 (two) times daily as needed for muscle spasms.    Return in about 1 week (around 04/30/2017).  Jonah Blue, MD, FACP

## 2017-04-23 NOTE — Patient Instructions (Signed)
Pie de atleta (Athlete's Foot) El pie de atleta (tinea pedis) es una infeccin por hongos en la piel de los pies. Generalmente se produce en la piel que se encuentra entre los dedos o debajo de ellos. Tambin puede aparecer en la planta de los pies. La infeccin puede transmitirse de Neomia Dearuna persona a otra (es contagiosa). CUIDADOS EN EL HOGAR  Aplquese o tome los medicamentos de venta libre y los recetados solamente como se lo haya indicado el mdico.  OceanographerConcurra a todas las visitas de control como se lo haya indicado el mdico. Esto es importante.  No se rasque los pies.  Mantenga los pies secos: ? Use calcetines de algodn o lana. Cmbiese los calcetines CarMaxtodos los das o si se mojan. ? Use un tipo de calzado que permita que el aire [place], como sandalias o zapatillas de lona.  Lvese y squese los pies: ? SunTrustodos los das o como se lo haya indicado el mdico. ? Despus de Lear Corporationhacer actividad fsica. ? Sin olvidar la zona The Krogerentre los dedos.  Use sandalias cuando tenga que pisar zonas hmedas, como vestuarios y duchas compartidas.  No comparta ninguno de los siguientes objetos: ? Toallas. ? Alicates para las uas. ? Otros objetos de uso personal que tengan contacto con sus pies.  Si tiene diabetes, mantenga bajo control el nivel de Bankerazcar en la sangre. SOLICITE AYUDA SI:  Tiene fiebre.  Aumenta la hinchazn, el dolor, el calor o el enrojecimiento en el pie.  No mejora con el tratamiento.  Los sntomas empeoran.  Aparecen nuevos sntomas. Esta informacin no tiene Theme park managercomo fin reemplazar el consejo del mdico. Asegrese de hacerle al mdico cualquier pregunta que tenga. Document Released: 09/30/2010 Document Revised: 12/20/2015 Document Reviewed: 03/01/2015 Elsevier Interactive Patient Education  [employer]2018 Elsevier Inc.

## 2017-04-24 ENCOUNTER — Emergency Department (HOSPITAL_COMMUNITY)
Admission: EM | Admit: 2017-04-24 | Discharge: 2017-04-24 | Disposition: A | Discharge status: Home / Self Care | Payer: Self-pay | Attending: Emergency Medicine | Admitting: Emergency Medicine

## 2017-04-24 DIAGNOSIS — R42 Dizziness and giddiness: Secondary | ICD-10-CM | POA: Insufficient documentation

## 2017-04-24 DIAGNOSIS — Z79899 Other long term (current) drug therapy: Secondary | ICD-10-CM | POA: Insufficient documentation

## 2017-04-24 DIAGNOSIS — Z7984 Long term (current) use of oral hypoglycemic drugs: Secondary | ICD-10-CM | POA: Insufficient documentation

## 2017-04-24 DIAGNOSIS — E119 Type 2 diabetes mellitus without complications: Secondary | ICD-10-CM | POA: Insufficient documentation

## 2017-04-24 DIAGNOSIS — I10 Essential (primary) hypertension: Secondary | ICD-10-CM | POA: Insufficient documentation

## 2017-04-24 LAB — I-STAT CHEM 8, ED
BUN: 8 mg/dL (ref 6–20)
Calcium, Ion: 1.2 mmol/L (ref 1.15–1.40)
Chloride: 101 mmol/L (ref 101–111)
Creatinine, Ser: 0.7 mg/dL (ref 0.61–1.24)
Glucose, Bld: 103 mg/dL — ABNORMAL HIGH (ref 65–99)
HCT: 42 % (ref 39.0–52.0)
Hemoglobin: 14.3 g/dL (ref 13.0–17.0)
Potassium: 3.6 mmol/L (ref 3.5–5.1)
Sodium: 139 mmol/L (ref 135–145)
TCO2: 26 mmol/L (ref 0–100)

## 2017-04-24 MED ORDER — MECLIZINE HCL 25 MG PO TABS: 25.0000 mg | ORAL_TABLET | Freq: Three times a day (TID) | ORAL | 0 refills | Status: DC | PRN

## 2017-04-24 NOTE — ED Provider Notes (Signed)
[facility] Provider Note   CSN: [id number] Arrival date & time: 04/24/17  1056     History   Chief Complaint Chief Complaint  Patient presents with  . Dizziness    HPI Wesley [name] is a 51 y.o. male.  HPI Patient presents to the emergency room for evaluation of dizziness.patient has a history of multiple visits to the emergency room.patient has had various complaints including anxiety, chest pain, and shortness of breath. He has had numerous laboratory tests, x-rays as well as imaging tests of the brain.  Patient states he has been having trouble with dizziness for the last day or so. He feels like the room is moving. He denies any trouble with headache. No chest pain. No shortness of breath. Patient is also concerned that his blood sugar is abnormal. He would like that checked. Past Medical History:  Diagnosis Date  . Alcohol abuse   . Anxiety   . Anxiety   . Chronic neck pain   . Cocaine abuse   . Diabetes mellitus without complication (HCC)   . Hypertension     Patient Active Problem List   Diagnosis Date Noted  . Prediabetes 04/23/2017  . Muscle spasm 04/23/2017  . Essential hypertension 04/16/2017  . H/O medication noncompliance 04/02/2017  . Vitamin D deficiency 04/02/2017  . DJD (degenerative joint disease) of cervical spine 03/16/2017  . 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 Dizziness 01/23/2017  . Anxiety 11/21/2016    Past Surgical History:  Procedure Laterality Date  . EYE SURGERY Right ~ 1980   "hit my eye"       Home Medications    Prior to Admission medications   Medication Sig Start Date End Date Taking? Authorizing Provider  amLODipine (NORVASC) 5 MG tablet Take 1 tablet (5 mg total) by mouth daily. 02/21/17  Yes McClung, Marzella Schlein, PA-C  LORazepam (ATIVAN) 0.5 MG tablet Take 0.5 mg by mouth 2 (two) times daily as needed. for anxiety 04/02/17  Yes [provider]  metFORMIN (GLUCOPHAGE) 500 MG tablet Take 1 tablet (500 mg total) by mouth 2  (two) times daily with a meal. 04/16/17  Yes Marcine Matar, MD  methocarbamol (ROBAXIN) 500 MG tablet Take 1 tablet (500 mg total) by mouth 2 (two) times daily as needed for muscle spasms. 04/23/17  Yes Marcine Matar, MD  terbinafine (LAMISIL AT) 1 % cream Apply 1 application topically 2 (two) times daily. 04/23/17  Yes Marcine Matar, MD  VOLTAREN 1 % GEL Apply 2 g topically 2 (two) times daily as needed for pain. 04/16/17  Yes [provider]  meclizine (ANTIVERT) 25 MG tablet Take 1 tablet (25 mg total) by mouth 3 (three) times daily as needed for dizziness. 04/24/17   Linwood Dibbles, MD  meloxicam (MOBIC) 15 MG tablet Take 1 tablet (15 mg total) by mouth daily. Patient not taking: Reported on 04/24/2017 04/23/17   Marcine Matar, MD    Family History Family History  Problem Relation Age of Onset  . Hypertension Mother   . Hypertension Father     Social History Social History  Substance Use Topics  . Smoking status: Never Smoker  . Smokeless tobacco: Never Used  . Alcohol use No     Comment: 11/21/2016 "last drank 06/2016"; denies      Allergies   Motrin [ibuprofen]; Other; and Zofran [ondansetron hcl]   Review of Systems Review of Systems  Psychiatric/Behavioral: The patient is nervous/anxious.   All other systems reviewed and are negative.  Physical Exam Updated Vital Signs BP 124/80   Pulse 83   Temp 98.3 F (36.8 C)   Resp 16   SpO2 100%   Physical Exam  Constitutional: He is oriented to person, place, and time. He appears well-developed and well-nourished. No distress.  HENT:  Head: Normocephalic and atraumatic.  Right Ear: External ear normal.  Left Ear: External ear normal.  Mouth/Throat: Oropharynx is clear and moist.  Eyes: Conjunctivae are normal. Right eye exhibits no discharge. Left eye exhibits no discharge. No scleral icterus.  Neck: Neck supple. No tracheal deviation present.  Cardiovascular: Normal rate, regular rhythm and  intact distal pulses.   Pulmonary/Chest: Effort normal and breath sounds normal. No stridor. No respiratory distress. He has no wheezes. He has no rales.  Abdominal: Soft. Bowel sounds are normal. He exhibits no distension. There is no tenderness. There is no rebound and no guarding.  Musculoskeletal: He exhibits no edema or tenderness.  Neurological: He is alert and oriented to person, place, and time. He has normal strength. No cranial nerve deficit (No facial droop, extraocular movements intact, tongue midline ) or sensory deficit. He exhibits normal muscle tone. He displays a negative Romberg sign. He displays no seizure activity. Coordination normal.  No pronator drift bilateral upper extrem, able to hold both legs off bed for 5 seconds, sensation intact in all extremities, no visual field cuts, no left or right sided neglect, normal finger-nose exam bilaterally, no nystagmus noted   Skin: Skin is warm and dry. No rash noted.  Psychiatric: He has a normal mood and affect.  Nursing note and vitals reviewed.    ED Treatments / Results  Labs (all labs ordered are listed, but only abnormal results are displayed) Labs Reviewed  I-STAT CHEM 8, ED - Abnormal; Notable for the following:       Result Value   Glucose, Bld 103 (*)    All other components within normal limits     Procedures Procedures (including critical care time)  Medications Ordered in ED Medications - No data to display   Initial Impression / Assessment and Plan / ED Course  I have reviewed the triage vital signs and the nursing notes.  Pertinent labs & imaging results that were available during my care of the patient were reviewed by me and considered in my medical decision making (see chart for details).   Labs are normal.  Pt has a normal neuro exam.   Doubt stroke or central cause for vertigo.  Possible peripheral vertigo vs anxiety related.  At this time there does not appear to be any evidence of an acute  emergency medical condition and the patient appears stable for discharge with appropriate outpatient follow up.  Final Clinical Impressions(s) / ED Diagnoses   Final diagnoses:  Dizziness    New Prescriptions New Prescriptions   MECLIZINE (ANTIVERT) 25 MG TABLET    Take 1 tablet (25 mg total) by mouth 3 (three) times daily as needed for dizziness.     Linwood DibblesKnapp, Jon, MD 04/24/17 [phone]
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
## 2017-04-24 NOTE — Discharge Instructions (Signed)
Tried taking the meclizine to see if it helps with her dizziness. This medication can help with inner ear problems. Follow-up with your primary care doctor.

## 2017-04-24 NOTE — ED Triage Notes (Signed)
Pt very adamant about getting his blood sugar checked.  Pt states that he has been dizzy for the past 2 days.  Denies NVD.  Neuro intact.  Pt ambulatory without assistance to triage room.

## 2017-04-26 ENCOUNTER — Ambulatory Visit: Payer: Self-pay | Attending: Internal Medicine | Admitting: *Deleted

## 2017-04-27 NOTE — Patient Instructions (Addendum)
Patient was advised to continue taking medication as prescribed.

## 2017-04-29 ENCOUNTER — Emergency Department (HOSPITAL_COMMUNITY)
Admission: EM | Admit: 2017-04-29 | Discharge: 2017-04-29 | Disposition: A | Discharge status: Home / Self Care | Payer: Self-pay | Attending: Emergency Medicine | Admitting: Emergency Medicine

## 2017-04-29 ENCOUNTER — Encounter (HOSPITAL_COMMUNITY): Payer: Self-pay | Admitting: *Deleted

## 2017-04-29 DIAGNOSIS — R079 Chest pain, unspecified: Secondary | ICD-10-CM | POA: Insufficient documentation

## 2017-04-29 DIAGNOSIS — Z5321 Procedure and treatment not carried out due to patient leaving prior to being seen by health care provider: Secondary | ICD-10-CM | POA: Insufficient documentation

## 2017-04-29 LAB — I-STAT TROPONIN, ED: Troponin i, poc: 0.01 ng/mL (ref 0.00–0.08)

## 2017-04-29 NOTE — ED Triage Notes (Signed)
Pt is here for right sided CP that began while at home. Pt reports mild sob with this.

## 2017-04-29 NOTE — ED Notes (Signed)
Pt came to nurse first desk, asked if we called name, pt back in waiting room.

## 2017-04-29 NOTE — ED Notes (Signed)
Pt called 4 times, no answer

## 2017-04-29 NOTE — ED Notes (Signed)
Called patient, unable to locate patient at this time. 

## 2017-04-29 NOTE — ED Notes (Signed)
Called several times in waiting room, no answer.  

## 2017-04-30 ENCOUNTER — Ambulatory Visit: Payer: Self-pay | Attending: Internal Medicine | Admitting: Internal Medicine

## 2017-04-30 ENCOUNTER — Ambulatory Visit (HOSPITAL_BASED_OUTPATIENT_CLINIC_OR_DEPARTMENT_OTHER): Payer: Self-pay | Admitting: *Deleted

## 2017-04-30 ENCOUNTER — Encounter: Payer: Self-pay | Admitting: Internal Medicine

## 2017-04-30 VITALS — BP 117/77 | HR 87 | Temp 98.7°F | Resp 16 | Wt 184.8 lb

## 2017-04-30 VITALS — BP 122/85 | HR 83 | Resp 16

## 2017-04-30 DIAGNOSIS — Z9114 Patient's other noncompliance with medication regimen: Secondary | ICD-10-CM | POA: Insufficient documentation

## 2017-04-30 DIAGNOSIS — I10 Essential (primary) hypertension: Secondary | ICD-10-CM

## 2017-04-30 DIAGNOSIS — F419 Anxiety disorder, unspecified: Secondary | ICD-10-CM | POA: Insufficient documentation

## 2017-04-30 DIAGNOSIS — Z7984 Long term (current) use of oral hypoglycemic drugs: Secondary | ICD-10-CM | POA: Insufficient documentation

## 2017-04-30 DIAGNOSIS — H5212 Myopia, left eye: Secondary | ICD-10-CM

## 2017-04-30 DIAGNOSIS — R079 Chest pain, unspecified: Secondary | ICD-10-CM | POA: Insufficient documentation

## 2017-04-30 DIAGNOSIS — E559 Vitamin D deficiency, unspecified: Secondary | ICD-10-CM | POA: Insufficient documentation

## 2017-04-30 DIAGNOSIS — H5461 Unqualified visual loss, right eye, normal vision left eye: Secondary | ICD-10-CM | POA: Insufficient documentation

## 2017-04-30 DIAGNOSIS — M47812 Spondylosis without myelopathy or radiculopathy, cervical region: Secondary | ICD-10-CM | POA: Insufficient documentation

## 2017-04-30 DIAGNOSIS — M542 Cervicalgia: Secondary | ICD-10-CM | POA: Insufficient documentation

## 2017-04-30 DIAGNOSIS — Z765 Malingerer [conscious simulation]: Secondary | ICD-10-CM | POA: Insufficient documentation

## 2017-04-30 DIAGNOSIS — R7303 Prediabetes: Secondary | ICD-10-CM | POA: Insufficient documentation

## 2017-04-30 DIAGNOSIS — H548 Legal blindness, as defined in USA: Secondary | ICD-10-CM | POA: Insufficient documentation

## 2017-04-30 DIAGNOSIS — M1990 Unspecified osteoarthritis, unspecified site: Secondary | ICD-10-CM | POA: Insufficient documentation

## 2017-04-30 DIAGNOSIS — R42 Dizziness and giddiness: Secondary | ICD-10-CM | POA: Insufficient documentation

## 2017-04-30 DIAGNOSIS — M62838 Other muscle spasm: Secondary | ICD-10-CM | POA: Insufficient documentation

## 2017-04-30 NOTE — Patient Instructions (Signed)
Decrease Metformin to 500 mg 1/2 tablet daily.  Change Lorazepam to 1/2 tablet four times a day.

## 2017-04-30 NOTE — Progress Notes (Signed)
Patient ID: Wesley [name], male    DOB: [birth year]  MRN: [id number]  CC: Follow-up   Subjective: Wesley [name] is a 51 y.o. male who presents for ER f/u His concerns today include:  Went to ER yesterday for sharp RT side CP and felt SOB and left after being seen by nurse  Woke this morning felt dizzy when walking.  This has been an intermittent complaint. He wonders if Lorazepam is causing Taking Norvasc Taking Metformin 500 mg one tab daily instead of BID  Neck Pain - Robaxin helps.  Just pick up Mobic today.  Did not see P.T as indicated on last visit.  Said they did not get the referral from his chiropractor.   Has problems seeing small letters and seeing road signs. 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 Prediabetes 04/23/2017  . Muscle spasm 04/23/2017  . Essential hypertension 04/16/2017  . H/O medication noncompliance 04/02/2017  . Vitamin D deficiency 04/02/2017  . DJD (degenerative joint disease) of cervical spine 03/16/2017  . 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 Dizziness 01/23/2017  . Anxiety 11/21/2016     Current Outpatient Prescriptions on File Prior to Visit  Medication Sig Dispense Refill  . amLODipine (NORVASC) 5 MG tablet Take 1 tablet (5 mg total) by mouth daily. 90 tablet 3  . LORazepam (ATIVAN) 0.5 MG tablet Take 0.5 mg by mouth 2 (two) times daily as needed. for anxiety  0  . meclizine (ANTIVERT) 25 MG tablet Take 1 tablet (25 mg total) by mouth 3 (three) times daily as needed for dizziness. (Patient not taking: Reported on 04/30/2017) 30 tablet 0  . meloxicam (MOBIC) 15 MG tablet Take 1 tablet (15 mg total) by mouth daily. 30 tablet 0  . metFORMIN (GLUCOPHAGE) 500 MG tablet Take 1 tablet (500 mg total) by mouth 2 (two) times daily with a meal. 30 tablet 3  . methocarbamol (ROBAXIN) 500 MG tablet Take 1 tablet (500 mg total) by mouth 2 (two) times daily as needed for muscle spasms.  (Patient not taking: Reported on 04/30/2017) 30 tablet 1  . terbinafine (LAMISIL AT) 1 % cream Apply 1 application topically 2 (two) times daily. (Patient not taking: Reported on 04/30/2017) 30 g 0  . VOLTAREN 1 % GEL Apply 2 g topically 2 (two) times daily as needed for pain.  1   No current facility-administered medications on file prior to visit.     Allergies  Allergen Reactions  . Motrin [Ibuprofen] Other (See Comments)    Makes his throat get "very dry" after taking it  . Other Other (See Comments)    Steroids: Patient received a steroid shot from [facility] [phone] and it resulted in his muscles twitching all over his body  . Zofran [Ondansetron Hcl] Other (See Comments)    Causes sweating and body "feels badly"    Social History   Social History  . Marital status: Significant Other    Spouse name: N/A  . Number of children: N/A  . Years of education: N/A   Occupational History  . Not on file.   Social History Main Topics  . Smoking status: Never Smoker  . Smokeless tobacco: Never Used  . Alcohol use No     Comment: 11/21/2016 "last drank 06/2016"; denies   . Drug use: Yes     Comment: denies, reports past history   . Sexual activity:  Not on file   Other Topics Concern  . Not on file   Social History Narrative  . No narrative on file    Family History  Problem Relation Age of Onset  . Hypertension Mother   . Hypertension Father     Past Surgical History:  Procedure Laterality Date  . EYE SURGERY Right ~ 1980   "hit my eye"    ROS: Review of Systems As stated above PHYSICAL EXAM: BP 117/77   Pulse 87   Temp 98.7 F (37.1 C) (Oral)   Resp 16   Wt 184 lb 12.8 oz (83.8 kg)   SpO2 98%   BMI 29.83 kg/m   BP 120/85, BP 120/80 standing Physical Exam  General appearance - alert, well appearing, and in no distress Mental status - alert, oriented to person, place, and time Mouth - mucous membranes moist, pharynx normal without  lesions Neck - supple, no significant adenopathy. No carotid bruit Chest - clear to auscultation, no wheezes, rales or rhonchi, symmetric air entry Heart - normal rate, regular rhythm, normal S1, S2, no murmurs, rubs, clicks or gallops Neurological - cranial nerves II through XII intact except for no vision RT eye, motor and sensory grossly normal bilaterally, normal muscle tone, no tremors, strength 5/5 Extremities - peripheral pulses normal, no pedal edema, no clubbing or cyanosis  Lab Results  Component Value Date   WBC 5.9 04/20/2017   HGB 14.3 04/24/2017   HCT 42.0 04/24/2017   MCV 82.9 04/20/2017   PLT 244 04/20/2017    ASSESSMENT AND PLAN: 1. Anxiety disorder, unspecified type -still issue for him and goes to ER frequently. Try adding Lexapro before but pt would not take consistently and states it causes whole body to shake. May consider adding Buspar -will get LCSW involved again to get him plugge in with mental health services. He went to [place] once and did not like the telemedicine approach  2. Dizziness I think more related to anxiety but will refer to neurology -recommend decreasing Metformin to 500 mg 1/2 tab daily in case he is having hypoglycemia -since he thinks Lorazepam may be playing role, I recommend taking 1/2 of the 0.5 mg 4 times a day instead of full tab BID.  3. Chest pain at rest Seen in ER with atypical symptoms. Enzymes always negative and EKG without changes  4. Myopia of left eye-does not have [facility] or [facility] discount. Recommend making appt with optometrist at [facility] -we have tried to get him to complete paper work for [employer] in past  5. Legally blind in right eye, as defined in [place]   6. Spondylosis of cervical region without myelopathy or radiculopathy - Ambulatory referral to Physical Therapy Continue Mobic which I doubt he is taking   Patient was given the opportunity to ask questions.  Patient verbalized understanding of the plan and  was able to repeat key elements of the plan.   Orders Placed This Encounter  Procedures  . Ambulatory referral to Physical Therapy  . Ambulatory referral to Neurology     Requested Prescriptions    No prescriptions requested or ordered in this encounter    Return in about 8 days (around 05/08/2017).  Jonah Blue, MD, FACP

## 2017-04-30 NOTE — Progress Notes (Signed)
Pt arrived to [facility], alert and oriented and arrives in good spirits.  Pt denies chest pain, SOB, HA, or blurred vision. He admits he feels dizzy.  Verified medication. Pt states Lorazepam was taken this morning and last night.  He has not taken any other medication today.  He states he has not had nothing to eat this morning.  His blood pressure was reading: 122/85

## 2017-05-01 ENCOUNTER — Encounter (HOSPITAL_COMMUNITY): Payer: Self-pay | Admitting: *Deleted

## 2017-05-01 DIAGNOSIS — M542 Cervicalgia: Secondary | ICD-10-CM | POA: Insufficient documentation

## 2017-05-01 DIAGNOSIS — Z5321 Procedure and treatment not carried out due to patient leaving prior to being seen by health care provider: Secondary | ICD-10-CM | POA: Insufficient documentation

## 2017-05-01 NOTE — ED Triage Notes (Signed)
Pt reports dizziness after taking his HTN meds and neck pain.  Pt has been seen several time for same.  He states that the dizziness is worse this time.  Pt is A&Ox 4.  Ambulatory without difficulty.

## 2017-05-02 ENCOUNTER — Emergency Department (HOSPITAL_COMMUNITY)
Admission: EM | Admit: 2017-05-02 | Discharge: 2017-05-02 | Disposition: A | Discharge status: Home / Self Care | Payer: Self-pay | Attending: Emergency Medicine | Admitting: Emergency Medicine

## 2017-05-02 DIAGNOSIS — H548 Legal blindness, as defined in USA: Secondary | ICD-10-CM | POA: Insufficient documentation

## 2017-05-02 MED ORDER — METFORMIN HCL 500 MG PO TABS: 250.0000 mg | ORAL_TABLET | Freq: Every day | ORAL | 3 refills | Status: DC

## 2017-05-02 NOTE — ED Notes (Signed)
Pt called to reassess vitals. 

## 2017-05-04 ENCOUNTER — Telehealth: Payer: Self-pay

## 2017-05-04 ENCOUNTER — Ambulatory Visit: Payer: Self-pay | Attending: Internal Medicine

## 2017-05-04 NOTE — Telephone Encounter (Signed)
Met with the patient when he walked  in the clinic today for BP check and blood sugar check today.  Reminded him when it is appropriate to use the ED verses coming to the [facility] and he stated that he understood.  As per Ebbie Latus, RMA, BP: 113/72; POC glucose : 107

## 2017-05-06 ENCOUNTER — Ambulatory Visit (HOSPITAL_COMMUNITY)
Admission: EM | Admit: 2017-05-06 | Discharge: 2017-05-06 | Disposition: A | Discharge status: Home / Self Care | Payer: Self-pay | Attending: Physician Assistant | Admitting: Physician Assistant

## 2017-05-06 ENCOUNTER — Encounter (HOSPITAL_COMMUNITY): Payer: Self-pay | Admitting: Emergency Medicine

## 2017-05-06 ENCOUNTER — Emergency Department (HOSPITAL_COMMUNITY)
Admission: EM | Admit: 2017-05-06 | Discharge: 2017-05-07 | Discharge status: Against Medical Advice | Payer: Self-pay | Attending: Emergency Medicine | Admitting: Emergency Medicine

## 2017-05-06 DIAGNOSIS — S161XXA Strain of muscle, fascia and tendon at neck level, initial encounter: Secondary | ICD-10-CM

## 2017-05-06 DIAGNOSIS — Z5321 Procedure and treatment not carried out due to patient leaving prior to being seen by health care provider: Secondary | ICD-10-CM | POA: Insufficient documentation

## 2017-05-06 MED ORDER — VOLTAREN 1 % TD GEL: 2.0000 g | Freq: Two times a day (BID) | TRANSDERMAL | 0 refills | Status: DC | PRN

## 2017-05-06 NOTE — ED Triage Notes (Signed)
The patient presented to the [facility] with a complaint of neck pain and "i feel hot in my face." x 1 day.

## 2017-05-06 NOTE — Discharge Instructions (Signed)
Continue Mobic as directed. Use voltaren gel on affected area as needed. Heat compress as needed. Muscle strains can take up to 3-4 weeks to completely resolve. Follow up with PCP for further evaluation and treatment as needed.

## 2017-05-06 NOTE — ED Provider Notes (Signed)
[facility]    CSN: [id number] Arrival date & time: 05/06/17  1955     History   Chief Complaint Chief Complaint  Patient presents with  . Torticollis    HPI Wesley [name] is a 51 y.o. male.   51 year old male with history of chronic neck pain, degenerative disc disease of cervical spine, anxiety, alcohol abuse, cocaine abuse, hypertension, comes in for 1 day history of neck pain. He states pain is at the back of the neck, and is hard to move. Pain is constant, and worse with movement. Denies recent injury. He has not taken anything for the pain. No fever, chills, night sweats. Denies numbness, tingling. Patient works as a Education administrator, and requires lots of movement, though he states he did work today. He has a states anxiety might be causing neck stiffness.      Past Medical History:  Diagnosis Date  . Alcohol abuse   . Anxiety   . Anxiety   . Chronic neck pain   . Cocaine abuse   . Diabetes mellitus without complication (HCC)   . Hypertension     Patient Active Problem List   Diagnosis Date Noted  . Legally blind in right eye, as defined in [place] 05/02/2017  . Prediabetes 04/23/2017  . Muscle spasm 04/23/2017  . Essential hypertension 04/16/2017  . H/O medication noncompliance 04/02/2017  . Vitamin D deficiency 04/02/2017  . DJD (degenerative joint disease) of cervical spine 03/16/2017  . 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 Dizziness 01/23/2017  . Anxiety 11/21/2016    Past Surgical History:  Procedure Laterality Date  . EYE SURGERY Right ~ 1980   "hit my eye"       Home Medications    Prior to Admission medications   Medication Sig Start Date End Date Taking? Authorizing Provider  amLODipine (NORVASC) 5 MG tablet Take 1 tablet (5 mg total) by mouth daily. 02/21/17   Anders Simmonds, PA-C  LORazepam (ATIVAN) 0.5 MG tablet Take 0.5 mg by mouth 2 (two) times daily as needed. for anxiety 04/02/17   [provider]  meloxicam (MOBIC) 15 MG tablet Take 1  tablet (15 mg total) by mouth daily. 04/23/17   Marcine Matar, MD  metFORMIN (GLUCOPHAGE) 500 MG tablet Take 0.5 tablets (250 mg total) by mouth daily. 05/02/17   Marcine Matar, MD  VOLTAREN 1 % GEL Apply 2 g topically 2 (two) times daily as needed. 05/06/17   Belinda Fisher, PA-C    Family History Family History  Problem Relation Age of Onset  . Hypertension Mother   . Hypertension Father     Social History Social History  Substance Use Topics  . Smoking status: Never Smoker  . Smokeless tobacco: Never Used  . Alcohol use No     Comment: 11/21/2016 "last drank 06/2016"; denies      Allergies   Motrin [ibuprofen]; Other; and Zofran [ondansetron hcl]   Review of Systems Review of Systems  Reason unable to perform ROS: See HPI as above.     Physical Exam Triage Vital Signs ED Triage Vitals [05/06/17 2034]  Enc Vitals Group     BP 118/71     Pulse Rate 100     Resp 18     Temp 97.8 F (36.6 C)     Temp Source Oral     SpO2 100 %     Weight      Height      Head Circumference  Peak Flow      Pain Score 7     Pain Loc      Pain Edu?      Excl. in GC?    No data found.   Updated Vital Signs BP 118/71 (BP Location: Right Arm)   Pulse 100   Temp 97.8 F (36.6 C) (Oral)   Resp 18   SpO2 100%      Physical Exam  Constitutional: He is oriented to person, place, and time. He appears well-developed and well-nourished. No distress.  HENT:  Head: Normocephalic and atraumatic.  Neck: Normal range of motion. Neck supple. Muscular tenderness (bilateral) present. No spinous process tenderness present. Normal range of motion present.  Cardiovascular: Normal rate, regular rhythm and normal heart sounds.  Exam reveals no gallop and no friction rub.   No murmur heard. Pulmonary/Chest: Effort normal and breath sounds normal. He has no wheezes. He has no rales.  Musculoskeletal:  Tenderness on palpation of bilateral upper back. No midline tenderness. Full range of  motion of shoulders. Strength normal and equal bilaterally. Sensation intact and equal bilaterally. Pedal pulses 2+ and equal bilaterally.  Neurological: He is alert and oriented to person, place, and time.     UC Treatments / Results  Labs (all labs ordered are listed, but only abnormal results are displayed) Labs Reviewed - No data to display  EKG  EKG Interpretation None       Radiology No results found.  Procedures Procedures (including critical care time)  Medications Ordered in UC Medications - No data to display   Initial Impression / Assessment and Plan / UC Course  I have reviewed the triage vital signs and the nursing notes.  Pertinent labs & imaging results that were available during my care of the patient were reviewed by me and considered in my medical decision making (see chart for details).    History and exam most consistent with muscle strain, patient taking Mobic, to continue. Voltaren gel on affected area. Heat compress. Discussed with patient this can take up to 3-4 weeks to completely resolve, but he should be feeling better each week. Follow-up with PCP for further evaluation treatment needed.  Final Clinical Impressions(s) / UC Diagnoses   Final diagnoses:  Strain of neck muscle, initial encounter    New Prescriptions Current Discharge Medication List        [name] 05/06/17 2110

## 2017-05-06 NOTE — ED Notes (Signed)
EKG given to EDP,Campos,MD., for review. 

## 2017-05-06 NOTE — ED Triage Notes (Addendum)
Pt reports having right sided chest pain along with pain in neck and feeling hot in face. Pt states pain has been ongoing for the last hour. Pt also concerned that blood sugar is elevated and has not been taking medication for diabetes.

## 2017-05-07 ENCOUNTER — Emergency Department (HOSPITAL_COMMUNITY): Payer: Self-pay

## 2017-05-07 ENCOUNTER — Encounter (HOSPITAL_COMMUNITY): Payer: Self-pay | Admitting: Emergency Medicine

## 2017-05-07 LAB — BASIC METABOLIC PANEL
Anion gap: 9 (ref 5–15)
BUN: 11 mg/dL (ref 6–20)
CO2: 25 mmol/L (ref 22–32)
Calcium: 9.2 mg/dL (ref 8.9–10.3)
Chloride: 103 mmol/L (ref 101–111)
Creatinine, Ser: 0.77 mg/dL (ref 0.61–1.24)
GFR calc Af Amer: >60 mL/min (ref >60)
GFR calc non Af Amer: >60 mL/min (ref >60)
Glucose, Bld: 106 mg/dL — ABNORMAL HIGH (ref 65–99)
Potassium: 3.8 mmol/L (ref 3.5–5.1)
Sodium: 137 mmol/L (ref 135–145)

## 2017-05-07 LAB — CBC
HCT: 40.5 % (ref 39.0–52.0)
Hemoglobin: 13.8 g/dL (ref 13.0–17.0)
MCH: 27.7 pg (ref 26.0–34.0)
MCHC: 34.1 g/dL (ref 30.0–36.0)
MCV: 81.2 fL (ref 78.0–100.0)
Platelets: 276 10*3/uL (ref 150–400)
RBC: 4.99 MIL/uL (ref 4.22–5.81)
RDW: 13.6 % (ref 11.5–15.5)
WBC: 6.3 10*3/uL (ref 4.0–10.5)

## 2017-05-07 LAB — POCT I-STAT TROPONIN I: Troponin i, poc: 0 ng/mL (ref 0.00–0.08)

## 2017-05-07 NOTE — ED Notes (Signed)
Attempted lab draw x 2 but unsuccessful. 

## 2017-05-07 NOTE — ED Notes (Signed)
No answer when called for room 

## 2017-05-08 ENCOUNTER — Ambulatory Visit: Payer: Self-pay | Admitting: Internal Medicine

## 2017-05-13 ENCOUNTER — Encounter (HOSPITAL_COMMUNITY): Payer: Self-pay | Admitting: Emergency Medicine

## 2017-05-13 ENCOUNTER — Ambulatory Visit (HOSPITAL_COMMUNITY): Admission: EM | Admit: 2017-05-13 | Discharge: 2017-05-13 | Discharge status: Against Medical Advice | Payer: Self-pay

## 2017-05-13 DIAGNOSIS — Z5321 Procedure and treatment not carried out due to patient leaving prior to being seen by health care provider: Secondary | ICD-10-CM | POA: Insufficient documentation

## 2017-05-13 DIAGNOSIS — R1013 Epigastric pain: Secondary | ICD-10-CM | POA: Insufficient documentation

## 2017-05-13 LAB — CBG MONITORING, ED: Glucose-Capillary: 131 mg/dL — ABNORMAL HIGH (ref 65–99)

## 2017-05-13 NOTE — ED Notes (Signed)
Called x1. No answer.

## 2017-05-13 NOTE — ED Triage Notes (Signed)
Pt reports having epigastric pain that started today along with feeling hot in the face. Denies any other symptoms.

## 2017-05-14 ENCOUNTER — Emergency Department (HOSPITAL_COMMUNITY)
Admission: EM | Admit: 2017-05-14 | Discharge: 2017-05-14 | Disposition: A | Discharge status: Home / Self Care | Payer: Self-pay | Attending: Emergency Medicine | Admitting: Emergency Medicine

## 2017-05-14 NOTE — ED Notes (Signed)
No answer when called for vital recheck.  

## 2017-05-15 ENCOUNTER — Encounter (HOSPITAL_COMMUNITY): Payer: Self-pay | Admitting: Emergency Medicine

## 2017-05-15 ENCOUNTER — Ambulatory Visit: Payer: Self-pay | Admitting: Internal Medicine

## 2017-05-15 ENCOUNTER — Ambulatory Visit (HOSPITAL_COMMUNITY)
Admission: EM | Admit: 2017-05-15 | Discharge: 2017-05-15 | Disposition: A | Discharge status: Home / Self Care | Payer: Self-pay | Attending: Family Medicine | Admitting: Family Medicine

## 2017-05-15 DIAGNOSIS — S161XXA Strain of muscle, fascia and tendon at neck level, initial encounter: Secondary | ICD-10-CM

## 2017-05-15 DIAGNOSIS — Z131 Encounter for screening for diabetes mellitus: Secondary | ICD-10-CM

## 2017-05-15 LAB — GLUCOSE, CAPILLARY: Glucose-Capillary: 91 mg/dL (ref 65–99)

## 2017-05-15 MED ORDER — TRAMADOL HCL 50 MG PO TABS: 50.0000 mg | ORAL_TABLET | Freq: Four times a day (QID) | ORAL | 0 refills | Status: DC | PRN

## 2017-05-15 NOTE — ED Provider Notes (Signed)
  [facility]   [id number] 05/15/17 Arrival Time: 1224  ASSESSMENT & PLAN:  1. Neck strain, initial encounter     Meds ordered this encounter  Medications  . traMADol (ULTRAM) 50 MG tablet    Sig: Take 1 tablet (50 mg total) by mouth every 6 (six) hours as needed.    Dispense:  15 tablet    Refill:  0   NSAID for 3-4 days. Ensure ROM. Will f/u if not showing improvement.  Reviewed expectations re: course of current medical issues. Questions answered. Outlined signs and symptoms indicating need for more acute intervention. Patient verbalized understanding. After Visit Summary given.   SUBJECTIVE:  Wesley [name] is a 51 y.o. male who presents with complaint of upper back and neck discomfort starting yesterday. Question related to recent lifting. Movement exacerbates. Describes as "stiff and sore." No UE sensation changes or weakness. No trauma. No self treatment. No previous neck problems reported.  Also requests blood sugar check. Has been elevated in the past.  ROS: As per HPI.   OBJECTIVE:  Vitals:   05/15/17 1322  BP: (!) 130/91  Pulse: 76  Resp: (!) 22  Temp: 99 F (37.2 C)  TempSrc: Oral  SpO2: 97%    Recheck RR: 16  General appearance: alert; no distress HENT: normocephalic; atraumatic Neck: supple with FROM but he moves slowly; tender over neck musculature extending into trapezius distribution (R>L); no midline tenderness Lungs: clear to auscultation bilaterally Heart: regular rate and rhythm Extremities: no cyanosis or edema; symmetrical with no gross deformities Skin: warm and dry Neurologic: normal UE strength and sensation Psychological: alert and cooperative; normal mood and affect  Allergies  Allergen Reactions  . Motrin [Ibuprofen] Other (See Comments)    Makes his throat get "very dry" after taking it  . Other Other (See Comments)    Steroids: Patient received a steroid shot from [facility] [phone] and it resulted  in his muscles twitching all over his body  . Zofran [Ondansetron Hcl] Other (See Comments)    Causes sweating and body "feels badly"    Past Medical History:  Diagnosis Date  . Alcohol abuse   . Anxiety   . Anxiety   . Chronic neck pain   . Cocaine abuse   . Diabetes mellitus without complication (HCC)   . Hypertension    Social History   Social History  . Marital status: Significant Other    Spouse name: N/A  . Number of children: N/A  . Years of education: N/A   Occupational History  . Not on file.   Social History Main Topics  . Smoking status: Never Smoker  . Smokeless tobacco: Never Used  . Alcohol use No     Comment: 11/21/2016 "last drank 06/2016"; denies   . Drug use: Yes     Comment: denies, reports past history   . Sexual activity: Not on file   Other Topics Concern  . Not on file   Social History Narrative  . No narrative on file   Family History  Problem Relation Age of Onset  . Hypertension Mother   . Hypertension Father    Past Surgical History:  Procedure Laterality Date  . EYE SURGERY Right ~ 1980   "hit my eye"     Mardella LaymanHagler, Brian, MD 05/16/17 [phone]

## 2017-05-15 NOTE — ED Triage Notes (Addendum)
Complains of upper back and neck pain that started last night and has worsened.  Patient feeling nauseated.    Patient asking for "sugar checked"

## 2017-05-22 ENCOUNTER — Telehealth: Payer: Self-pay

## 2017-05-22 ENCOUNTER — Encounter: Payer: Self-pay | Admitting: Internal Medicine

## 2017-05-22 ENCOUNTER — Ambulatory Visit: Payer: Self-pay | Attending: Internal Medicine | Admitting: Internal Medicine

## 2017-05-22 VITALS — BP 123/81 | HR 77 | Temp 98.4°F | Resp 16 | Wt 185.0 lb

## 2017-05-22 DIAGNOSIS — R252 Cramp and spasm: Secondary | ICD-10-CM | POA: Insufficient documentation

## 2017-05-22 DIAGNOSIS — H548 Legal blindness, as defined in USA: Secondary | ICD-10-CM | POA: Insufficient documentation

## 2017-05-22 DIAGNOSIS — Z79899 Other long term (current) drug therapy: Secondary | ICD-10-CM | POA: Insufficient documentation

## 2017-05-22 DIAGNOSIS — F419 Anxiety disorder, unspecified: Secondary | ICD-10-CM | POA: Insufficient documentation

## 2017-05-22 DIAGNOSIS — I10 Essential (primary) hypertension: Secondary | ICD-10-CM | POA: Insufficient documentation

## 2017-05-22 DIAGNOSIS — M47812 Spondylosis without myelopathy or radiculopathy, cervical region: Secondary | ICD-10-CM | POA: Insufficient documentation

## 2017-05-22 DIAGNOSIS — Z888 Allergy status to other drugs, medicaments and biological substances status: Secondary | ICD-10-CM | POA: Insufficient documentation

## 2017-05-22 DIAGNOSIS — R42 Dizziness and giddiness: Secondary | ICD-10-CM | POA: Insufficient documentation

## 2017-05-22 DIAGNOSIS — Z7984 Long term (current) use of oral hypoglycemic drugs: Secondary | ICD-10-CM | POA: Insufficient documentation

## 2017-05-22 MED ORDER — METHOCARBAMOL 500 MG PO TABS: 500.0000 mg | ORAL_TABLET | Freq: Two times a day (BID) | ORAL | 0 refills | Status: DC | PRN

## 2017-05-22 MED ORDER — LORAZEPAM 0.5 MG PO TABS: 0.5000 mg | ORAL_TABLET | Freq: Two times a day (BID) | ORAL | 0 refills | Status: DC | PRN

## 2017-05-22 NOTE — Telephone Encounter (Signed)
Dr Laural BenesJohnson reported that she put in a referral for PT and the patient has not yet been seen.   [facility] # [phone] to inquire about the status of the referral.  [facility] stated that they tried to call the patient once and have not heard back. She stated that he just needs to call the office for an appointment.   While the patient was in the clinic ,provided him with the phone # for the [facility] and instructed him to call for an appointment. He stated that he understood.  Also provided him with information about [facility]

## 2017-05-22 NOTE — Progress Notes (Signed)
Patient ID: Wesley [name], male    DOB: [birth year]  MRN: [id number]  CC:   Subjective:  Wesley [name] is a 51 y.o. male who presents for UC visit. Last seen 04/30/2017 His concerns today include:  Since last visit, he was seen in ER x 2 for neck pain. Also went 2 other times but left without being seen.  1. Saw a private massage person for his neck and found it helpful in dec spasms - Has f/u this Friday.  -Robaxin helps. Requests RF. He was also prescribed meloxicam which he states he is taking. -Referred to physical therapy on last visit with me. They have tried calling him and left a message. Patient does not recall receiving a message. -wanting appt with ortho.  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  Dizziness: still intermittent dizziness Has appt with Neurology 06/29/2017. He is aware of this appointment  3. Anxiety: -takes Lorazepam at least once a day.  -has 6 left.  -Finds to medication helpful.   Patient Active Problem List   Diagnosis Date Noted  . 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 Prediabetes 04/23/2017  . Muscle spasm 04/23/2017  . Essential hypertension 04/16/2017  . H/O medication noncompliance 04/02/2017  . Vitamin D deficiency 04/02/2017  . DJD (degenerative joint disease) of cervical spine 03/16/2017  . 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 Dizziness 01/23/2017  . Anxiety 11/21/2016     Current Outpatient Prescriptions on File Prior to Visit  Medication Sig Dispense Refill  . amLODipine (NORVASC) 5 MG tablet Take 1 tablet (5 mg total) by mouth daily. 90 tablet 3  . meloxicam (MOBIC) 15 MG tablet Take 1 tablet (15 mg total) by mouth daily. 30 tablet 0  . metFORMIN (GLUCOPHAGE) 500 MG tablet Take 0.5 tablets (250 mg total) by mouth daily. 30 tablet 3  . traMADol (ULTRAM) 50 MG tablet Take 1 tablet (50 mg total) by  mouth every 6 (six) hours as needed. 15 tablet 0  . VOLTAREN 1 % GEL Apply 2 g topically 2 (two) times daily as needed. 1 Tube 0   No current facility-administered medications on file prior to visit.     Allergies  Allergen Reactions  . Motrin [Ibuprofen] Other (See Comments)    Makes his throat get "very dry" after taking it  . Other Other (See Comments)    Steroids: Patient received a steroid shot from [facility] ([phone]) [phone] and it resulted in his muscles twitching all over his body  . Zofran [Ondansetron Hcl] Other (See Comments)    Causes sweating and body "feels badly"     ROS: Review of Systems Negative except as stated above  PHYSICAL EXAM: BP 123/81   Pulse 77   Temp 98.4 F (36.9 C) (Oral)   Resp 16   Wt 185 lb (83.9 kg)   SpO2 98%   BMI 29.86 kg/m   Physical Exam General appearance - alert, well appearing, and in no distress Mental status - alert, oriented to person, place, and time, normal mood, behavior, speech, dress, motor activity, and thought processes Neck - supple, no significant adenopathy Chest - clear to auscultation, no wheezes, rales or rhonchi, symmetric air entry Heart - normal rate, regular rhythm, normal S1, S2, no murmurs, rubs, clicks or gallops Extremities - peripheral pulses normal, no  pedal edema, no clubbing or cyanosis  ASSESSMENT AND PLAN: 1. Spondylosis of cervical region without myelopathy or radiculopathy -Our case worker contacted physical therapy at [facility] on his behalf. They said they still have the order and patient needs to call to schedule an appointment. Phone number was given to patient before he left today. -Continue Robaxin and meloxicam  2. Anxiety disorder, unspecified type -Number of ER visits has decreased but I wanted to be even less.  LCSW saw patient today. We will try to get him in with a community mental health provider. He did not like going to [employer]. We will try [facility] of the [place].  Advise to est care there asap -Rf given on Lorazepam 0.5 mg BID to fill on or after 05/25/2017 #15 tabs. F/U in 2 wks [facility] reviewed and is appropriate 3. Dizziness I think related to anxiety Has appt with neurology next mth  Patient was given the opportunity to ask questions.  Patient verbalized understanding of the plan and was able to repeat key elements of the plan.   No orders of the defined types were placed in this encounter.    Requested Prescriptions   Signed Prescriptions Disp Refills  . LORazepam (ATIVAN) 0.5 MG tablet 15 tablet 0    Sig: Take 1 tablet (0.5 mg total) by mouth 2 (two) times daily as needed for anxiety. for anxiety  . methocarbamol (ROBAXIN) 500 MG tablet 30 tablet 0    Sig: Take 1 tablet (500 mg total) by mouth 2 (two) times daily as needed for muscle spasms.    Future Appointments Date Time Provider Department Center  06/07/2017 10:30 AM Marcine Matar, MD [facility] None  06/29/2017 2:00 PM Glendale Chard, DO [facility] None    Jonah Blue, MD, Jerrel Ivory

## 2017-05-24 ENCOUNTER — Ambulatory Visit (HOSPITAL_COMMUNITY)
Admission: EM | Admit: 2017-05-24 | Discharge: 2017-05-24 | Disposition: A | Discharge status: Home / Self Care | Payer: Self-pay | Attending: Family Medicine | Admitting: Family Medicine

## 2017-05-24 ENCOUNTER — Ambulatory Visit: Payer: Self-pay | Attending: Internal Medicine | Admitting: *Deleted

## 2017-05-24 ENCOUNTER — Encounter (HOSPITAL_COMMUNITY): Payer: Self-pay | Admitting: Family Medicine

## 2017-05-24 VITALS — BP 108/74 | HR 74 | Resp 16

## 2017-05-24 DIAGNOSIS — F419 Anxiety disorder, unspecified: Secondary | ICD-10-CM

## 2017-05-24 DIAGNOSIS — I10 Essential (primary) hypertension: Secondary | ICD-10-CM

## 2017-05-24 DIAGNOSIS — R7303 Prediabetes: Secondary | ICD-10-CM | POA: Insufficient documentation

## 2017-05-24 DIAGNOSIS — R42 Dizziness and giddiness: Secondary | ICD-10-CM

## 2017-05-24 DIAGNOSIS — Z0130 Encounter for examination of blood pressure without abnormal findings: Secondary | ICD-10-CM

## 2017-05-24 LAB — GLUCOSE, POCT (MANUAL RESULT ENTRY): POC Glucose: 107 mg/dl — AB (ref 70–99)

## 2017-05-24 MED ORDER — MECLIZINE HCL 25 MG PO TABS: 25.0000 mg | ORAL_TABLET | Freq: Three times a day (TID) | ORAL | 0 refills | Status: DC | PRN

## 2017-05-24 NOTE — Progress Notes (Signed)
[facility] and [facility]. Pt arrives in good spirits. Last OV 05/22/2017  with  PCP.  Pt denies chest pain or SOB. He took lorazepam this morning and feels dizzy.   Reviewed medication list with patient. Pt states he only took lorazepam this morning.   Blood pressure reading WNL: 108/74

## 2017-05-24 NOTE — ED Triage Notes (Signed)
Pt here for dizziness and body heaviness x 3 hours.

## 2017-05-27 ENCOUNTER — Emergency Department (HOSPITAL_COMMUNITY)
Admission: EM | Admit: 2017-05-27 | Discharge: 2017-05-27 | Disposition: A | Discharge status: Home / Self Care | Payer: Self-pay | Attending: Emergency Medicine | Admitting: Emergency Medicine

## 2017-05-27 ENCOUNTER — Encounter (HOSPITAL_COMMUNITY): Payer: Self-pay | Admitting: Emergency Medicine

## 2017-05-27 DIAGNOSIS — E119 Type 2 diabetes mellitus without complications: Secondary | ICD-10-CM | POA: Insufficient documentation

## 2017-05-27 DIAGNOSIS — G8929 Other chronic pain: Secondary | ICD-10-CM | POA: Insufficient documentation

## 2017-05-27 DIAGNOSIS — F419 Anxiety disorder, unspecified: Secondary | ICD-10-CM | POA: Insufficient documentation

## 2017-05-27 DIAGNOSIS — R42 Dizziness and giddiness: Secondary | ICD-10-CM

## 2017-05-27 DIAGNOSIS — M542 Cervicalgia: Secondary | ICD-10-CM | POA: Insufficient documentation

## 2017-05-27 DIAGNOSIS — I10 Essential (primary) hypertension: Secondary | ICD-10-CM | POA: Insufficient documentation

## 2017-05-27 DIAGNOSIS — R110 Nausea: Secondary | ICD-10-CM

## 2017-05-27 LAB — COMPREHENSIVE METABOLIC PANEL
ALT: 23 U/L (ref 17–63)
AST: 30 U/L (ref 15–41)
Albumin: 4.3 g/dL (ref 3.5–5.0)
Alkaline Phosphatase: 70 U/L (ref 38–126)
Anion gap: 8 (ref 5–15)
BUN: 10 mg/dL (ref 6–20)
CO2: 25 mmol/L (ref 22–32)
Calcium: 9.4 mg/dL (ref 8.9–10.3)
Chloride: 105 mmol/L (ref 101–111)
Creatinine, Ser: 0.77 mg/dL (ref 0.61–1.24)
GFR calc Af Amer: >60 mL/min (ref >60)
GFR calc non Af Amer: >60 mL/min (ref >60)
Glucose, Bld: 89 mg/dL (ref 65–99)
Potassium: 4.3 mmol/L (ref 3.5–5.1)
Sodium: 138 mmol/L (ref 135–145)
Total Bilirubin: 0.8 mg/dL (ref 0.3–1.2)
Total Protein: 7.9 g/dL (ref 6.5–8.1)

## 2017-05-27 LAB — CBC WITH DIFFERENTIAL/PLATELET
Basophils Absolute: 0 10*3/uL (ref 0.0–0.1)
Basophils Relative: 0 %
Eosinophils Absolute: 0.1 10*3/uL (ref 0.0–0.7)
Eosinophils Relative: 2 %
HCT: 43.6 % (ref 39.0–52.0)
Hemoglobin: 14.4 g/dL (ref 13.0–17.0)
Lymphocytes Relative: 35 %
Lymphs Abs: 2.5 10*3/uL (ref 0.7–4.0)
MCH: 27.4 pg (ref 26.0–34.0)
MCHC: 33 g/dL (ref 30.0–36.0)
MCV: 82.9 fL (ref 78.0–100.0)
Monocytes Absolute: 0.5 10*3/uL (ref 0.1–1.0)
Monocytes Relative: 6 %
Neutro Abs: 4.1 10*3/uL (ref 1.7–7.7)
Neutrophils Relative %: 57 %
Platelets: 272 10*3/uL (ref 150–400)
RBC: 5.26 MIL/uL (ref 4.22–5.81)
RDW: 13.8 % (ref 11.5–15.5)
WBC: 7.1 10*3/uL (ref 4.0–10.5)

## 2017-05-27 LAB — LIPASE, BLOOD: Lipase: 26 U/L (ref 11–51)

## 2017-05-27 LAB — URINALYSIS, ROUTINE W REFLEX MICROSCOPIC
Bilirubin Urine: NEGATIVE
Glucose, UA: NEGATIVE mg/dL
Hgb urine dipstick: NEGATIVE
Ketones, ur: NEGATIVE mg/dL
Leukocytes, UA: NEGATIVE
Nitrite: NEGATIVE
Protein, ur: NEGATIVE mg/dL
Specific Gravity, Urine: 1.01 (ref 1.005–1.030)
pH: 8 (ref 5.0–8.0)

## 2017-05-27 MED ORDER — PROMETHAZINE HCL 25 MG PO TABS: 25.0000 mg | ORAL_TABLET | Freq: Four times a day (QID) | ORAL | 0 refills | Status: DC | PRN

## 2017-05-27 MED ORDER — MECLIZINE HCL 25 MG PO TABS
25.0000 mg | ORAL_TABLET | Freq: Once | ORAL | Status: AC
  Administered 2017-05-27: 25 mg via ORAL
  Filled 2017-05-27: qty 1

## 2017-05-27 MED ORDER — MECLIZINE HCL 25 MG PO TABS: 25.0000 mg | ORAL_TABLET | Freq: Three times a day (TID) | ORAL | 0 refills | Status: DC | PRN

## 2017-05-27 MED ORDER — ACETAMINOPHEN 325 MG PO TABS
650.0000 mg | ORAL_TABLET | Freq: Once | ORAL | Status: AC
  Administered 2017-05-27: 650 mg via ORAL
  Filled 2017-05-27: qty 2

## 2017-05-27 MED ORDER — PROMETHAZINE HCL 25 MG/ML IJ SOLN
25.0000 mg | Freq: Once | INTRAMUSCULAR | Status: AC
  Administered 2017-05-27: 25 mg via INTRAVENOUS
  Filled 2017-05-27: qty 1

## 2017-05-27 MED ORDER — LORAZEPAM 2 MG/ML IJ SOLN
0.5000 mg | Freq: Once | INTRAMUSCULAR | Status: AC
  Administered 2017-05-27: 0.5 mg via INTRAVENOUS
  Filled 2017-05-27: qty 1

## 2017-05-27 MED ORDER — SODIUM CHLORIDE 0.9 % IV BOLUS (SEPSIS)
1000.0000 mL | Freq: Once | INTRAVENOUS | Status: AC
  Administered 2017-05-27: 1000 mL via INTRAVENOUS

## 2017-05-27 NOTE — ED Triage Notes (Signed)
Patient c/o nausea since this morning and chronic neck pain. Reports dizziness with movement since this morning. Denies N/V/D, headache, abdominal pain. Reports hx anxiety. States he took ativan with no relief.

## 2017-05-27 NOTE — ED Provider Notes (Signed)
[facility] Provider Note   CSN: [id number] Arrival date & time: 05/27/17  4098     History   Chief Complaint Chief Complaint  Patient presents with  . Nausea  . Neck Pain    HPI Wesley [name] is a 51 y.o. male.  Pt presents to the ED today with nausea, dizziness, and neck pain.  Pt said sx started this morning.  He took 0.25 mg of ativan this morning without help.  His doctor has been weaning him off ativan for the past 3 months.  Neck pain and anxiety are chronic and he has a care plan in place.  He said nausea is new.      Past Medical History:  Diagnosis Date  . Alcohol abuse   . Anxiety   . Anxiety   . Chronic neck pain   . Cocaine abuse   . Diabetes mellitus without complication (HCC)   . Hypertension     Patient Active Problem List   Diagnosis Date Noted  . Legally blind in right eye, as defined in [place] 05/02/2017  . Prediabetes 04/23/2017  . Muscle spasm 04/23/2017  . Essential hypertension 04/16/2017  . H/O medication noncompliance 04/02/2017  . Vitamin D deficiency 04/02/2017  . DJD (degenerative joint disease) of cervical spine 03/16/2017  . 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 Dizziness 01/23/2017  . Anxiety 11/21/2016    Past Surgical History:  Procedure Laterality Date  . EYE SURGERY Right ~ 1980   "hit my eye"       Home Medications    Prior to Admission medications   Medication Sig Start Date End Date Taking? Authorizing Provider  amLODipine (NORVASC) 5 MG tablet Take 1 tablet (5 mg total) by mouth daily. 02/21/17  Yes McClung, Marzella Schlein, PA-C  LORazepam (ATIVAN) 0.5 MG tablet Take 1 tablet (0.5 mg total) by mouth 2 (two) times daily as needed for anxiety. for anxiety 05/25/17  Yes Marcine Matar, MD  methocarbamol (ROBAXIN) 500 MG tablet Take 1 tablet (500 mg total) by mouth 2 (two) times daily as needed for muscle spasms. 05/22/17  Yes Marcine Matar, MD  VOLTAREN 1 % GEL Apply 2 g topically 2 (two) times daily as needed. 05/06/17  Yes  Yu, Amy V, PA-C  meclizine (ANTIVERT) 25 MG tablet Take 1 tablet (25 mg total) by mouth 3 (three) times daily as needed for dizziness. 05/27/17   Jacalyn Lefevre, MD  meloxicam (MOBIC) 15 MG tablet Take 1 tablet (15 mg total) by mouth daily. Patient not taking: Reported on 05/27/2017 04/23/17   Marcine Matar, MD  metFORMIN (GLUCOPHAGE) 500 MG tablet Take 0.5 tablets (250 mg total) by mouth daily. Patient not taking: Reported on 05/24/2017 05/02/17   Marcine Matar, MD  promethazine (PHENERGAN) 25 MG tablet Take 1 tablet (25 mg total) by mouth every 6 (six) hours as needed for nausea or vomiting. 05/27/17   Jacalyn Lefevre, MD    Family History Family History  Problem Relation Age of Onset  . Hypertension Mother   . Hypertension Father     Social History Social History  Substance Use Topics  . Smoking status: Never Smoker  . Smokeless tobacco: Never Used  . Alcohol use No     Comment: 11/21/2016 "last drank 06/2016"; denies      Allergies   Motrin [ibuprofen]; Other; and Zofran [ondansetron hcl]   Review of Systems Review of Systems  Gastrointestinal: Positive for abdominal pain and nausea.  Musculoskeletal: Positive for neck pain.  Neurological: Positive for dizziness.  All other systems reviewed and are negative.    Physical Exam Updated Vital Signs BP 120/77 (BP Location: Right Arm)   Pulse 86   Temp 98 F (36.7 C) (Oral)   Resp 16   Ht  (1.676 m)   Wt 83.9 kg (185 lb)   SpO2 96%   BMI 29.86 kg/m   Physical Exam  Constitutional: He is oriented to person, place, and time. He appears well-developed and well-nourished.  HENT:  Head: Normocephalic and atraumatic.  Right Ear: External ear normal.  Left Ear: External ear normal.  Nose: Nose normal.  Mouth/Throat: Oropharynx is clear and moist.  Eyes: Pupils are equal, round, and reactive to light. Conjunctivae and EOM are normal.  Neck: Normal range of motion. Neck supple. Muscular tenderness present.    Cardiovascular: Normal rate, regular rhythm, normal heart sounds and intact distal pulses.   Pulmonary/Chest: Effort normal and breath sounds normal.  Abdominal: Soft. Bowel sounds are normal. There is tenderness in the epigastric area.  Musculoskeletal: Normal range of motion.  Neurological: He is alert and oriented to person, place, and time.  Skin: Skin is warm.  Psychiatric: His behavior is normal. Judgment and thought content normal. His mood appears anxious.  Nursing note and vitals reviewed.    ED Treatments / Results  Labs (all labs ordered are listed, but only abnormal results are displayed) Labs Reviewed  URINALYSIS, ROUTINE W REFLEX MICROSCOPIC - Abnormal; Notable for the following:       Result Value   Color, Urine STRAW (*)    All other components within normal limits  COMPREHENSIVE METABOLIC PANEL  CBC WITH DIFFERENTIAL/PLATELET  LIPASE, BLOOD    EKG  EKG Interpretation None       Radiology No results found.  Procedures Procedures (including critical care time)  Medications Ordered in ED Medications  sodium chloride 0.9 % bolus 1,000 mL (0 mLs Intravenous Stopped 05/27/17 1032)  promethazine (PHENERGAN) injection 25 mg (25 mg Intravenous Given 05/27/17 0908)  LORazepam (ATIVAN) injection 0.5 mg (0.5 mg Intravenous Given 05/27/17 0907)  meclizine (ANTIVERT) tablet 25 mg (25 mg Oral Given 05/27/17 0908)  acetaminophen (TYLENOL) tablet 650 mg (650 mg Oral Given 05/27/17 0908)     Initial Impression / Assessment and Plan / ED Course  I have reviewed the triage vital signs and the nursing notes.  Pertinent labs & imaging results that were available during my care of the patient were reviewed by me and considered in my medical decision making (see chart for details).   Pt is feeling better, but is still a little dizzy.  He will be d/c home on phenergan and antivert.  He knows to return if worse.  F/u with pcp.   Final Clinical Impressions(s) / ED Diagnoses    Final diagnoses:  Nausea  Vertigo  Anxiety  Chronic neck pain    New Prescriptions New Prescriptions   PROMETHAZINE (PHENERGAN) 25 MG TABLET    Take 1 tablet (25 mg total) by mouth every 6 (six) hours as needed for nausea or vomiting.     Jacalyn Lefevre, MD 05/27/17 [phone]

## 2017-05-29 ENCOUNTER — Ambulatory Visit: Payer: Self-pay | Attending: Internal Medicine | Admitting: *Deleted

## 2017-05-29 VITALS — BP 123/85 | HR 82

## 2017-05-29 DIAGNOSIS — I10 Essential (primary) hypertension: Secondary | ICD-10-CM

## 2017-05-29 DIAGNOSIS — F419 Anxiety disorder, unspecified: Secondary | ICD-10-CM | POA: Insufficient documentation

## 2017-05-29 DIAGNOSIS — Z5321 Procedure and treatment not carried out due to patient leaving prior to being seen by health care provider: Secondary | ICD-10-CM | POA: Insufficient documentation

## 2017-05-29 NOTE — ED Provider Notes (Signed)
[facility]   [id number] 05/24/17 Arrival Time: 1734  ASSESSMENT & PLAN:  1. Anxiety disorder, unspecified type   2. Dizziness     Meds ordered this encounter  Medications  . meclizine (ANTIVERT) 25 MG tablet    Sig: Take 1 tablet (25 mg total) by mouth 3 (three) times daily as needed for dizziness.    Dispense:  30 tablet    Refill:  0   Discussion over the fact that I think most of his symptoms are related to anxiety. He and his wife agree. Will schedule f/u with his PCP to discuss starting medication. May f/u here as needed.  Reviewed expectations re: course of current medical issues. Questions answered. Outlined signs and symptoms indicating need for more acute intervention. Patient verbalized understanding. After Visit Summary given.   SUBJECTIVE:  Wesley [name] is a 51 y.o. male who presents with complaint of "body heaviness" and "dizziness" for the past 3 hours. H/O similar before. Multiple workups not revealing physical cause. Wife reports he does suffer from anxiety. Otherwise well. No new medications.  ROS: As per HPI.   OBJECTIVE:  Vitals:   05/24/17 1819  BP: 125/85  Pulse: 77  Resp: 18  Temp: 98.6 F (37 C)  SpO2: 99%    General appearance: alert; no distress Eyes: PERRLA; EOMI; conjunctiva normal HENT: normocephalic; atraumatic Lungs: clear to auscultation bilaterally Heart: regular rate and rhythm Back: no CVA tenderness Extremities: no cyanosis or edema; symmetrical with no gross deformities Skin: warm and dry Neurologic: normal gait; normal symmetric reflexes Psychological: alert and cooperative; normal mood and affect  Labs: Results for orders placed or performed in visit on 05/24/17  Glucose (CBG)  Result Value Ref Range   POC Glucose 107 (A) 70 - 99 mg/dl   Imaging: No results found.  Allergies  Allergen Reactions  . Motrin [Ibuprofen] Other (See Comments)    Makes his throat get "very dry" after taking it  . Other  Other (See Comments)    Steroids: Patient received a steroid shot from [facility] ([phone]) [phone] and it resulted in his muscles twitching all over his body  . Zofran [Ondansetron Hcl] Other (See Comments)    Causes sweating and body "feels badly"    Past Medical History:  Diagnosis Date  . Alcohol abuse   . Anxiety   . Anxiety   . Chronic neck pain   . Cocaine abuse   . Diabetes mellitus without complication (HCC)   . Hypertension    Social History   Social History  . Marital status: Significant Other    Spouse name: N/A  . Number of children: N/A  . Years of education: N/A   Occupational History  . Not on file.   Social History Main Topics  . Smoking status: Never Smoker  . Smokeless tobacco: Never Used  . Alcohol use No     Comment: 11/21/2016 "last drank 06/2016"; denies   . Drug use: Yes     Comment: denies, reports past history   . Sexual activity: Not on file   Other Topics Concern  . Not on file   Social History Narrative  . No narrative on file   Family History  Problem Relation Age of Onset  . Hypertension Mother   . Hypertension Father    Past Surgical History:  Procedure Laterality Date  . EYE SURGERY Right ~ 1980   "hit my eye"     Mardella Layman, MD 05/29/17 [phone]

## 2017-05-29 NOTE — Progress Notes (Signed)
Pt request BP check today.  " I feel dizzy." He notices sometimes dizziness onsets 1 hour after he wakes up.He states this does not last long. He took lorazepam and methocarbamol this morning.   VSS: BP: 123/85 P: 82 SpO2: 98% CBG- 90  He denies chest pain, unusual blurred vision, SOB.  NAD assessed, alert and oriented.  Will route nurse visit to PCP.

## 2017-05-30 ENCOUNTER — Ambulatory Visit (HOSPITAL_COMMUNITY)
Admission: EM | Admit: 2017-05-30 | Discharge: 2017-05-30 | Disposition: A | Discharge status: Home / Self Care | Payer: Self-pay | Attending: Family Medicine | Admitting: Family Medicine

## 2017-05-30 ENCOUNTER — Encounter (HOSPITAL_COMMUNITY): Payer: Self-pay | Admitting: Emergency Medicine

## 2017-05-30 ENCOUNTER — Emergency Department (HOSPITAL_COMMUNITY)
Admission: EM | Admit: 2017-05-30 | Discharge: 2017-05-30 | Disposition: A | Discharge status: Home / Self Care | Payer: Self-pay | Attending: Emergency Medicine | Admitting: Emergency Medicine

## 2017-05-30 ENCOUNTER — Emergency Department (HOSPITAL_COMMUNITY)
Admission: EM | Admit: 2017-05-30 | Discharge: 2017-05-31 | Disposition: A | Discharge status: Home / Self Care | Payer: Self-pay | Attending: Emergency Medicine | Admitting: Emergency Medicine

## 2017-05-30 ENCOUNTER — Encounter (HOSPITAL_COMMUNITY): Payer: Self-pay

## 2017-05-30 DIAGNOSIS — R42 Dizziness and giddiness: Secondary | ICD-10-CM | POA: Insufficient documentation

## 2017-05-30 DIAGNOSIS — M47812 Spondylosis without myelopathy or radiculopathy, cervical region: Secondary | ICD-10-CM

## 2017-05-30 DIAGNOSIS — F419 Anxiety disorder, unspecified: Secondary | ICD-10-CM

## 2017-05-30 DIAGNOSIS — T148XXA Other injury of unspecified body region, initial encounter: Secondary | ICD-10-CM

## 2017-05-30 DIAGNOSIS — Z5321 Procedure and treatment not carried out due to patient leaving prior to being seen by health care provider: Secondary | ICD-10-CM | POA: Insufficient documentation

## 2017-05-30 DIAGNOSIS — S161XXA Strain of muscle, fascia and tendon at neck level, initial encounter: Secondary | ICD-10-CM

## 2017-05-30 MED ORDER — VOLTAREN 1 % TD GEL: 2.0000 g | Freq: Two times a day (BID) | TRANSDERMAL | 0 refills | Status: DC | PRN

## 2017-05-30 MED ORDER — MELOXICAM 15 MG PO TABS: 15.0000 mg | ORAL_TABLET | Freq: Every day | ORAL | 0 refills | Status: DC

## 2017-05-30 NOTE — ED Notes (Signed)
Called x 2 NO answer 

## 2017-05-30 NOTE — ED Notes (Signed)
No answer in lobby.

## 2017-05-30 NOTE — ED Triage Notes (Signed)
Pt states that he woke up tonight with anxiety and pain to his chest. Pt took his ativan with minimal relief. Pt states that he is also anxious that he took one today at 11am, one a 9pm and another at 11pm. Pt is anxious that he took to many ativan.

## 2017-05-30 NOTE — ED Triage Notes (Signed)
Pt has been to the ED or UC several times for the same symptoms.  Neck and chest pain, dizziness and heart racing.  Pt states he had a panic attack in the middle of the night and he felt his heart racing.  He was seen in the ED, but left without being seen.  He had an EKG done. He also had lab work done 3 days ago.  Pt states he is still feeling dizzy today.

## 2017-05-30 NOTE — Discharge Instructions (Signed)
Your exam was most consistent with muscle strain. Start mobic as directed. Voltaren gel as needed. Ice/heat compresses as needed. This can take up to 3-4 weeks to completely resolve, but you should be feeling better each week. Follow up here or with PCP if symptoms worsen, changes for reevaluation.   Please follow up with PCP or behavioral health for further evaluation and management of anxiety.

## 2017-05-30 NOTE — ED Provider Notes (Signed)
[facility]    CSN: [id number] Arrival date & time: 05/30/17  1046     History   Chief Complaint Chief Complaint  Patient presents with  . Dizziness    HPI Wesley [name] is a 51 y.o. male.   51 year old male with history of anxiety, alcohol abuse, cocaine abuse, chronic neck pain, diabetes comes in for anxiety and neck pain. Patient has visited the ED or urgent care 10+ times for similar symptoms in the past month. He states he had a panic attack last night with palpitations, and went to the ED, but left without being seen. He is here today because he feels continued neck pain and would like treatment for that. He understands that he has anxiety, and has been taking ativan without relief. He denies contacting his PCP for symptoms, but is interested in following up with behavioral health. Denies current chest pain, shortness of breath, weakness, dizziness, palpitations. Neck pain worse with movement. He works as a Education administrator.       Past Medical History:  Diagnosis Date  . Alcohol abuse   . Anxiety   . Anxiety   . Chronic neck pain   . Cocaine abuse   . Diabetes mellitus without complication (HCC)   . Hypertension     Patient Active Problem List   Diagnosis Date Noted  . Legally blind in right eye, as defined in [place] 05/02/2017  . Prediabetes 04/23/2017  . Muscle spasm 04/23/2017  . Essential hypertension 04/16/2017  . H/O medication noncompliance 04/02/2017  . Vitamin D deficiency 04/02/2017  . DJD (degenerative joint disease) of cervical spine 03/16/2017  . 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 Dizziness 01/23/2017  . Anxiety 11/21/2016    Past Surgical History:  Procedure Laterality Date  . EYE SURGERY Right ~ 1980   "hit my eye"       Home Medications    Prior to Admission medications   Medication Sig Start Date End Date Taking? Authorizing Provider  amLODipine (NORVASC) 5 MG tablet Take 1 tablet (5 mg total) by mouth daily. 02/21/17  Yes McClung, Marzella Schlein,  PA-C  LORazepam (ATIVAN) 0.5 MG tablet Take 1 tablet (0.5 mg total) by mouth 2 (two) times daily as needed for anxiety. for anxiety 05/25/17  Yes Marcine Matar, MD  meclizine (ANTIVERT) 25 MG tablet Take 1 tablet (25 mg total) by mouth 3 (three) times daily as needed for dizziness. 05/27/17  Yes Jacalyn Lefevre, MD  methocarbamol (ROBAXIN) 500 MG tablet Take 1 tablet (500 mg total) by mouth 2 (two) times daily as needed for muscle spasms. 05/22/17  Yes Marcine Matar, MD  meloxicam (MOBIC) 15 MG tablet Take 1 tablet (15 mg total) by mouth daily. 05/30/17   Cathie Hoops, Lenvil Swaim V, PA-C  metFORMIN (GLUCOPHAGE) 500 MG tablet Take 0.5 tablets (250 mg total) by mouth daily. Patient not taking: Reported on 05/24/2017 05/02/17   Marcine Matar, MD  promethazine (PHENERGAN) 25 MG tablet Take 1 tablet (25 mg total) by mouth every 6 (six) hours as needed for nausea or vomiting. 05/27/17   Jacalyn Lefevre, MD  VOLTAREN 1 % GEL Apply 2 g topically 2 (two) times daily as needed. 05/30/17   Belinda Fisher, PA-C    Family History Family History  Problem Relation Age of Onset  . Hypertension Mother   . Hypertension Father     Social History Social History  Substance Use Topics  . Smoking status: Never Smoker  . Smokeless tobacco: Never Used  .  Alcohol use No     Comment: 11/21/2016 "last drank 06/2016"; denies      Allergies   Motrin [ibuprofen]; Other; and Zofran [ondansetron hcl]   Review of Systems Review of Systems  Reason unable to perform ROS: See HPI as above.     Physical Exam Triage Vital Signs ED Triage Vitals [05/30/17 1152]  Enc Vitals Group     BP (!) 158/93     Pulse Rate 83     Resp      Temp 97.6 F (36.4 C)     Temp Source Oral     SpO2 100 %     Weight      Height      Head Circumference      Peak Flow      Pain Score 8     Pain Loc      Pain Edu?      Excl. in GC?    No data found.   Updated Vital Signs BP (!) 158/93 (BP Location: Left Arm)   Pulse 83   Temp 97.6  F (36.4 C) (Oral)   SpO2 100%    Physical Exam  Constitutional: He is oriented to person, place, and time. He appears well-developed and well-nourished. No distress.  HENT:  Head: Normocephalic and atraumatic.  Eyes: Pupils are equal, round, and reactive to light. Conjunctivae are normal.  Neck: Normal range of motion. Neck supple. Muscular tenderness (bilateral) present. No spinous process tenderness present. Normal range of motion present.  Cardiovascular: Normal rate, regular rhythm and normal heart sounds.  Exam reveals no gallop and no friction rub.   No murmur heard. Pulmonary/Chest: Effort normal and breath sounds normal. He has no wheezes. He has no rales.  Musculoskeletal:  Tenderness on palpation of bilateral trapezius area of shoulder. No bony tenderness. Full ROM of shoulder. Strength normal and equal bilaterally.   Neurological: He is alert and oriented to person, place, and time.     UC Treatments / Results  Labs (all labs ordered are listed, but only abnormal results are displayed) Labs Reviewed - No data to display  EKG  EKG Interpretation None       Radiology No results found.  Procedures Procedures (including critical care time)  Medications Ordered in UC Medications - No data to display   Initial Impression / Assessment and Plan / UC Course  I have reviewed the triage vital signs and the nursing notes.  Pertinent labs & imaging results that were available during my care of the patient were reviewed by me and considered in my medical decision making (see chart for details).    Refilled mobic and voltaren gel for muscle pain. Resources for behavioral health provided to patient for further treatment and evaluation of anxiety. Patient to follow up with PCP for further management of chronic neck pain.   Final Clinical Impressions(s) / UC Diagnoses   Final diagnoses:  Muscle strain  Anxiety    New Prescriptions Discharge Medication List as of  05/30/2017 12:54 PM        Belinda Fisher, PA-C 05/30/17 1601

## 2017-05-31 ENCOUNTER — Encounter (HOSPITAL_COMMUNITY): Payer: Self-pay

## 2017-05-31 NOTE — ED Triage Notes (Signed)
Pt not in the lobby 

## 2017-05-31 NOTE — ED Triage Notes (Signed)
Pt complains of dizziness and lower neck pain Pt thought his blood pressure was high, he takes meds

## 2017-06-01 ENCOUNTER — Emergency Department (HOSPITAL_COMMUNITY): Admission: EM | Admit: 2017-06-01 | Discharge: 2017-06-01 | Discharge status: Against Medical Advice | Payer: Self-pay

## 2017-06-01 MED FILL — AMLODIPINE BESYLATE 5 MG TA: 5 | 30 days supply | Qty: 30 | Fill #2

## 2017-06-01 NOTE — ED Notes (Signed)
Bed: [facility] Expected date:  Expected time:  Means of arrival:  Comments: 

## 2017-06-04 ENCOUNTER — Ambulatory Visit: Payer: Self-pay | Attending: Internal Medicine | Admitting: *Deleted

## 2017-06-04 ENCOUNTER — Telehealth: Payer: Self-pay | Admitting: Internal Medicine

## 2017-06-04 ENCOUNTER — Other Ambulatory Visit: Payer: Self-pay | Admitting: Internal Medicine

## 2017-06-04 VITALS — BP 123/80 | HR 76

## 2017-06-04 DIAGNOSIS — I10 Essential (primary) hypertension: Secondary | ICD-10-CM | POA: Insufficient documentation

## 2017-06-04 MED ORDER — LORAZEPAM 0.5 MG PO TABS: 0.5000 mg | ORAL_TABLET | Freq: Two times a day (BID) | ORAL | 0 refills | Status: DC | PRN

## 2017-06-04 NOTE — Telephone Encounter (Signed)
Pt came into office requesting medication refill on LORazepam (ATIVAN) 0.5 MG tablet Please f/up

## 2017-06-04 NOTE — Progress Notes (Signed)
RF Lorazepam
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
## 2017-06-04 NOTE — Patient Instructions (Signed)
Follow up for your appointment on Thursday at 10:30

## 2017-06-04 NOTE — Progress Notes (Signed)
Pt here to refill medication, Lorazepam. He has appointment on Thursday. He request to take BP  He will not have anymore. Did not take today.  Prescription given to patient by Dr. Laural Benes.  Pt aware of OV on Thursday with Dr. Laural Benes

## 2017-06-07 ENCOUNTER — Ambulatory Visit: Payer: Self-pay | Admitting: Internal Medicine

## 2017-06-11 ENCOUNTER — Emergency Department (HOSPITAL_COMMUNITY): Admission: EM | Admit: 2017-06-11 | Discharge: 2017-06-11 | Discharge status: Against Medical Advice | Payer: Self-pay

## 2017-06-11 ENCOUNTER — Emergency Department (HOSPITAL_COMMUNITY)
Admission: EM | Admit: 2017-06-11 | Discharge: 2017-06-11 | Disposition: A | Discharge status: Home / Self Care | Payer: Self-pay | Attending: Emergency Medicine | Admitting: Emergency Medicine

## 2017-06-11 DIAGNOSIS — R0602 Shortness of breath: Secondary | ICD-10-CM | POA: Insufficient documentation

## 2017-06-11 DIAGNOSIS — Z5321 Procedure and treatment not carried out due to patient leaving prior to being seen by health care provider: Secondary | ICD-10-CM | POA: Insufficient documentation

## 2017-06-11 NOTE — ED Notes (Signed)
Pt c/o shortness of breath, and asked if there were any negative interactions with the medicines that he was taking: meclizine, methocarbamol, amlodipine, and ativan.

## 2017-06-11 NOTE — ED Notes (Signed)
Pt not in lobby.  

## 2017-06-11 NOTE — ED Notes (Signed)
Called x 1 for triage 

## 2017-06-11 NOTE — ED Notes (Signed)
LWBS. Called x3.

## 2017-06-11 NOTE — ED Notes (Signed)
Called patient for triage with no answer.  

## 2017-06-11 NOTE — ED Notes (Signed)
No answer when called for triage x 3.  

## 2017-06-13 ENCOUNTER — Ambulatory Visit (HOSPITAL_COMMUNITY)
Admission: EM | Admit: 2017-06-13 | Discharge: 2017-06-13 | Disposition: A | Discharge status: Home / Self Care | Payer: Self-pay | Attending: Family Medicine | Admitting: Family Medicine

## 2017-06-13 ENCOUNTER — Encounter (HOSPITAL_COMMUNITY): Payer: Self-pay | Admitting: Emergency Medicine

## 2017-06-13 DIAGNOSIS — R42 Dizziness and giddiness: Secondary | ICD-10-CM

## 2017-06-13 MED ORDER — FLUTICASONE PROPIONATE 50 MCG/ACT NA SUSP: 2.0000 | Freq: Every day | NASAL | 0 refills | Status: DC

## 2017-06-13 NOTE — ED Triage Notes (Signed)
The patient presented to the [facility] with a complaint of chronic dizziness. The patient reported that he has not taken his Meclizine for 2 days because it caused a loss of vision. The patient also complained of a headache.

## 2017-06-13 NOTE — Discharge Instructions (Signed)
Use flonase to see if it helps with left ear popping sensation. Keep hydrated, your urine should be clear to pale yellow in color. Follow up with PCP as scheduled for further evaluation and treatment needed.

## 2017-06-13 NOTE — ED Provider Notes (Signed)
[facility]    CSN: [id number] Arrival date & time: 06/13/17  1701     History   Chief Complaint Chief Complaint  Patient presents with  . Dizziness    HPI Wesley [name] is a 51 y.o. male.   51 year old male with history of alcohol abuse, anxiety, chronic neck pain, chronic dizziness, diabetes, hypertension comes in for dizziness. Patient states that recently when he takes his meclizine, he has noticed some blurry vision, so stopped medication. He continues to have dizziness, and has noticed some ear pressure/popping of the left ear. He was seen at an emergency department 1 week ago and had CT scan with cardiac workup which were negative. Patient states dizziness is the same as it has always been, but ear pressure/popping is new. Denies URI symptoms such as cough, congestion, sore throat. Denies seasonal allergies, syncope. Denies use of alcohol, caffeine. Patient worries that his blood pressure medication, specifically amlodipine could be causing the dizziness, he has already contacted his PCP, who told him to decrease dosage of the medicine. He states that it does help the symptoms, but his blood pressure is also more elevated. Well controlled diabetes by diet, last a1c 6.       Past Medical History:  Diagnosis Date  . Alcohol abuse   . Anxiety   . Anxiety   . Chronic neck pain   . Cocaine abuse (HCC)   . Diabetes mellitus without complication (HCC)   . Hypertension     Patient Active Problem List   Diagnosis Date Noted  . Legally blind in right eye, as defined in [place] 05/02/2017  . Prediabetes 04/23/2017  . Muscle spasm 04/23/2017  . Essential hypertension 04/16/2017  . H/O medication noncompliance 04/02/2017  . Vitamin D deficiency 04/02/2017  . DJD (degenerative joint disease) of cervical spine 03/16/2017  . 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 Dizziness 01/23/2017  . Anxiety 11/21/2016    Past Surgical History:  Procedure Laterality Date  . EYE SURGERY Right ~  1980   "hit my eye"       Home Medications    Prior to Admission medications   Medication Sig Start Date End Date Taking? Authorizing Provider  amLODipine (NORVASC) 5 MG tablet Take 1 tablet (5 mg total) by mouth daily. 02/21/17   Anders Simmonds, PA-C  fluticasone (FLONASE) 50 MCG/ACT nasal spray Place 2 sprays into both nostrils daily. 06/13/17   Cathie Hoops, Amy V, PA-C  LORazepam (ATIVAN) 0.5 MG tablet Take 1 tablet (0.5 mg total) by mouth 2 (two) times daily as needed for anxiety. for anxiety Patient not taking: Reported on 06/04/2017 05/25/17   Marcine Matar, MD  LORazepam (ATIVAN) 0.5 MG tablet Take 1 tablet (0.5 mg total) by mouth 2 (two) times daily as needed for anxiety. 06/04/17   Marcine Matar, MD  meclizine (ANTIVERT) 25 MG tablet Take 1 tablet (25 mg total) by mouth 3 (three) times daily as needed for dizziness. 05/27/17   Jacalyn Lefevre, MD  meloxicam (MOBIC) 15 MG tablet Take 1 tablet (15 mg total) by mouth daily. 05/30/17   Cathie Hoops, Amy V, PA-C  methocarbamol (ROBAXIN) 500 MG tablet Take 1 tablet (500 mg total) by mouth 2 (two) times daily as needed for muscle spasms. 05/22/17   Marcine Matar, MD  promethazine (PHENERGAN) 25 MG tablet Take 1 tablet (25 mg total) by mouth every 6 (six) hours as needed for nausea or vomiting. 05/27/17   Jacalyn Lefevre, MD  VOLTAREN 1 %  GEL Apply 2 g topically 2 (two) times daily as needed. 05/30/17   Belinda Fisher, PA-C    Family History Family History  Problem Relation Age of Onset  . Hypertension Mother   . Hypertension Father     Social History Social History  Substance Use Topics  . Smoking status: Never Smoker  . Smokeless tobacco: Never Used  . Alcohol use No     Comment: 11/21/2016 "last drank 06/2016"; denies      Allergies   Motrin [ibuprofen]; Other; and Zofran [ondansetron hcl]   Review of Systems Review of Systems  Reason unable to perform ROS: See HPI as above.     Physical Exam Triage Vital Signs ED Triage Vitals   Enc Vitals Group     BP 06/13/17 1733 118/78     Pulse Rate 06/13/17 1733 79     Resp 06/13/17 1733 18     Temp 06/13/17 1733 98.5 F (36.9 C)     Temp Source 06/13/17 1733 Oral     SpO2 06/13/17 1733 100 %     Weight --      Height --      Head Circumference --      Peak Flow --      Pain Score 06/13/17 1734 7     Pain Loc --      Pain Edu? --      Excl. in GC? --    No data found.   Updated Vital Signs BP 118/78 (BP Location: Left Arm)   Pulse 79   Temp 98.5 F (36.9 C) (Oral)   Resp 18   SpO2 100%    Physical Exam  Constitutional: He is oriented to person, place, and time. He appears well-developed and well-nourished. No distress.  HENT:  Head: Normocephalic and atraumatic.  Right Ear: Tympanic membrane, external ear and ear canal normal. Tympanic membrane is not erythematous and not bulging.  Left Ear: Tympanic membrane, external ear and ear canal normal. Tympanic membrane is not erythematous and not bulging.  Nose: Nose normal. Right sinus exhibits no maxillary sinus tenderness and no frontal sinus tenderness. Left sinus exhibits no maxillary sinus tenderness and no frontal sinus tenderness.  Mouth/Throat: Uvula is midline, oropharynx is clear and moist and mucous membranes are normal.  Eyes: Pupils are equal, round, and reactive to light. Conjunctivae and EOM are normal.  Neck: Normal range of motion. Neck supple.  Cardiovascular: Normal rate, regular rhythm and normal heart sounds.  Exam reveals no gallop and no friction rub.   No murmur heard. Pulmonary/Chest: Effort normal and breath sounds normal. He has no decreased breath sounds. He has no wheezes. He has no rhonchi. He has no rales.  Lymphadenopathy:    He has no cervical adenopathy.  Neurological: He is alert and oriented to person, place, and time. He has normal strength. He is not disoriented. No cranial nerve deficit or sensory deficit. He displays a negative Romberg sign. GCS eye subscore is 4. GCS  verbal subscore is 5. GCS motor subscore is 6.  Skin: Skin is warm and dry.  Psychiatric: He has a normal mood and affect. His behavior is normal. Judgment normal.     UC Treatments / Results  Labs (all labs ordered are listed, but only abnormal results are displayed) Labs Reviewed - No data to display  EKG  EKG Interpretation None       Radiology No results found.  Procedures Procedures (including critical care time)  Medications Ordered  in UC Medications - No data to display   Initial Impression / Assessment and Plan / UC Course  I have reviewed the triage vital signs and the nursing notes.  Pertinent labs & imaging results that were available during my care of the patient were reviewed by me and considered in my medical decision making (see chart for details).     Patient with chronic dizziness that is similar to the past. He states having popping in left ear, exam normal, but discussed possible eustachian tube dysfunction as cause, will provide flonase. Patient alert and oriented without focal defects, able to ambulate without problems and without syncope. Extensive workup at the emergency department without alarming results. Patient to follow up with PCP for further evaluation and treatment needed. Return precautions given.   Final Clinical Impressions(s) / UC Diagnoses   Final diagnoses:  Dizziness    New Prescriptions Discharge Medication List as of 06/13/2017  5:53 PM    START taking these medications   Details  fluticasone (FLONASE) 50 MCG/ACT nasal spray Place 2 sprays into both nostrils daily., Starting Wed 06/13/2017, Normal          Linward Headland V, [place] 06/13/17 2031

## 2017-06-15 ENCOUNTER — Telehealth: Payer: Self-pay | Admitting: Internal Medicine

## 2017-06-15 NOTE — Telephone Encounter (Signed)
Call placed to patient #[phone] to check on his status and remind him of his upcoming appointment. No answer. Left message to return my call at [phone].

## 2017-06-17 ENCOUNTER — Encounter (HOSPITAL_COMMUNITY): Payer: Self-pay | Admitting: Emergency Medicine

## 2017-06-17 DIAGNOSIS — I10 Essential (primary) hypertension: Secondary | ICD-10-CM | POA: Insufficient documentation

## 2017-06-17 DIAGNOSIS — M62838 Other muscle spasm: Secondary | ICD-10-CM | POA: Insufficient documentation

## 2017-06-17 DIAGNOSIS — E119 Type 2 diabetes mellitus without complications: Secondary | ICD-10-CM | POA: Insufficient documentation

## 2017-06-17 DIAGNOSIS — Z79899 Other long term (current) drug therapy: Secondary | ICD-10-CM | POA: Insufficient documentation

## 2017-06-17 NOTE — ED Notes (Signed)
Pt also endorses posterior neck pain since yesterday.

## 2017-06-17 NOTE — ED Triage Notes (Signed)
Pt presents with recurrent anxiety with related SOB.  Pt in no acute distress at this time.  Airway and breathing intact and WNL.  Pt has careplan placed for 80 visits to ED in 6 months.  Pt ambulatory and A&O x4.

## 2017-06-18 ENCOUNTER — Encounter: Payer: Self-pay | Admitting: Internal Medicine

## 2017-06-18 ENCOUNTER — Telehealth: Payer: Self-pay

## 2017-06-18 ENCOUNTER — Ambulatory Visit: Payer: Self-pay | Attending: Internal Medicine | Admitting: Internal Medicine

## 2017-06-18 ENCOUNTER — Emergency Department (HOSPITAL_COMMUNITY)
Admission: EM | Admit: 2017-06-18 | Discharge: 2017-06-18 | Disposition: A | Discharge status: Home / Self Care | Payer: Self-pay | Attending: Emergency Medicine | Admitting: Emergency Medicine

## 2017-06-18 VITALS — BP 125/81 | HR 78 | Temp 98.7°F | Resp 16 | Wt 186.0 lb

## 2017-06-18 DIAGNOSIS — F419 Anxiety disorder, unspecified: Secondary | ICD-10-CM | POA: Insufficient documentation

## 2017-06-18 DIAGNOSIS — M47812 Spondylosis without myelopathy or radiculopathy, cervical region: Secondary | ICD-10-CM | POA: Insufficient documentation

## 2017-06-18 DIAGNOSIS — R7303 Prediabetes: Secondary | ICD-10-CM | POA: Insufficient documentation

## 2017-06-18 DIAGNOSIS — R42 Dizziness and giddiness: Secondary | ICD-10-CM | POA: Insufficient documentation

## 2017-06-18 DIAGNOSIS — M62838 Other muscle spasm: Secondary | ICD-10-CM

## 2017-06-18 DIAGNOSIS — E559 Vitamin D deficiency, unspecified: Secondary | ICD-10-CM | POA: Insufficient documentation

## 2017-06-18 DIAGNOSIS — I10 Essential (primary) hypertension: Secondary | ICD-10-CM | POA: Insufficient documentation

## 2017-06-18 DIAGNOSIS — Z9114 Patient's other noncompliance with medication regimen: Secondary | ICD-10-CM | POA: Insufficient documentation

## 2017-06-18 LAB — GLUCOSE, POCT (MANUAL RESULT ENTRY): POC Glucose: 117 mg/dl — AB (ref 70–99)

## 2017-06-18 MED ORDER — BUSPIRONE HCL 7.5 MG PO TABS: 7.5000 mg | ORAL_TABLET | Freq: Two times a day (BID) | ORAL | 1 refills | Status: DC

## 2017-06-18 MED ORDER — LIDOCAINE 5 % EX PTCH
1.0000 | MEDICATED_PATCH | CUTANEOUS | Status: DC
  Administered 2017-06-18: 1 via TRANSDERMAL
  Filled 2017-06-18: qty 1

## 2017-06-18 MED ORDER — LORAZEPAM 0.5 MG PO TABS: ORAL_TABLET | ORAL | 0 refills | Status: DC

## 2017-06-18 NOTE — Progress Notes (Signed)
Patient ID: Wesley [name], male    DOB: 04-24-1966  MRN: [id number]  CC: Follow-up   Subjective: Wesley [name] is a 51 y.o. male who presents for f/u anxiety. His male friend is with him. His concerns today include:  Hx pre-dm, anxiety, HTN, cervical spondylosis  Patient did not bring medications with him. He left them in his car. I requested that his male friend return to the car and bring the medicines.. He has the following bottles with him: Norvasc, Meclizine, Robaxin, Celexa 20 mg (from doctor in [place], just started taking today).  Pt no show appt with me on 2 occasions. No showed his last visit because he was feeling fine and did not want to bother me. However in the interim he has been to the emergency room several times including this morning for usually combination of a few of these c/o dizziness, palpitations, concern about his blood pressure and neck pain.  He has also been to [facility] and [facility]. Has a bottle of Celexa with him today which he states was given to him by a physician in the ER in [place].  -He did not keep his appointment with [facility] the [place] the end of last month. He gives no particular reason but states that he now has an appointment with her Place in [place] call [facility] on 06/28/2017. -finds Lorazepam helpful  C/o  of neck pain -referred to P.T but unable to pay up front and out of pocket. Completed chiropractor -reports compliance with Robaxin and Meloxicam but does not have bottle of Mobicand I doubt that he is taking it -wakes up with palpitations and jumping in muscle in his neck Last TSH in 03/2017 was normal.   Has appt with neurologist later this mth for dizziness.  Patient Active Problem List   Diagnosis Date Noted  . Legally blind in right eye, as defined in [place] 05/02/2017  . Prediabetes 04/23/2017  . Muscle spasm 04/23/2017  . Essential hypertension 04/16/2017  . H/O medication noncompliance  04/02/2017  . Vitamin D deficiency 04/02/2017  . DJD (degenerative joint disease) of cervical spine 03/16/2017  . 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 Dizziness 01/23/2017  . Anxiety 11/21/2016     Current Outpatient Prescriptions on File Prior to Visit  Medication Sig Dispense Refill  . amLODipine (NORVASC) 5 MG tablet Take 1 tablet (5 mg total) by mouth daily. 90 tablet 3  . fluticasone (FLONASE) 50 MCG/ACT nasal spray Place 2 sprays into both nostrils daily. 1 g 0  . LORazepam (ATIVAN) 0.5 MG tablet Take 1 tablet (0.5 mg total) by mouth 2 (two) times daily as needed for anxiety. for anxiety (Patient not taking: Reported on 06/04/2017) 15 tablet 0  . LORazepam (ATIVAN) 0.5 MG tablet Take 1 tablet (0.5 mg total) by mouth 2 (two) times daily as needed for anxiety. 28 tablet 0  . meclizine (ANTIVERT) 25 MG tablet Take 1 tablet (25 mg total) by mouth 3 (three) times daily as needed for dizziness. 30 tablet 0  . meloxicam (MOBIC) 15 MG tablet Take 1 tablet (15 mg total) by mouth daily. 15 tablet 0  . methocarbamol (ROBAXIN) 500 MG tablet Take 1 tablet (500 mg total) by mouth 2 (two) times daily as needed for muscle spasms. 30 tablet 0  . promethazine (PHENERGAN) 25 MG tablet Take 1 tablet (25 mg total) by mouth every 6 (six) hours as needed for nausea or vomiting. 10 tablet 0  . VOLTAREN 1 %  GEL Apply 2 g topically 2 (two) times daily as needed. 1 Tube 0   No current facility-administered medications on file prior to visit.     Allergies  Allergen Reactions  . Motrin [Ibuprofen] Other (See Comments)    Makes his throat get "very dry" after taking it  . Other Other (See Comments)    Steroids: Patient received a steroid shot from [facility] [phone] and it resulted in his muscles twitching all over his body  . Zofran [Ondansetron Hcl] Other (See Comments)    Causes sweating and body "feels badly"    Social History   Social History  . Marital status: Significant Other    Spouse  name: N/A  . Number of children: N/A  . Years of education: N/A   Occupational History  . Not on file.   Social History Main Topics  . Smoking status: Never Smoker  . Smokeless tobacco: Never Used  . Alcohol use No     Comment: 11/21/2016 "last drank 06/2016"; denies   . Drug use: Yes     Comment: denies, reports past history   . Sexual activity: Not on file   Other Topics Concern  . Not on file   Social History Narrative  . No narrative on file    Family History  Problem Relation Age of Onset  . Hypertension Mother   . Hypertension Father     Past Surgical History:  Procedure Laterality Date  . EYE SURGERY Right ~ 1980   "hit my eye"    ROS: Review of Systems As above PHYSICAL EXAM: BP 125/81   Pulse 78   Temp 98.7 F (37.1 C) (Oral)   Resp 16   Wt 186 lb (84.4 kg)   SpO2 99%   BMI 30.02 kg/m   Physical Exam  General appearance - alert, well appearing, and in no distress Mental status - alert, oriented to person, place, and time Neck - supple, no significant adenopathy Chest - clear to auscultation, no wheezes, rales or rhonchi, symmetric air entry Heart - normal rate, regular rhythm, normal S1, S2, no murmurs, rubs, clicks or gallops Extremities - peripheral pulses normal, no pedal edema, no clubbing or cyanosis  Results for orders placed or performed in visit on 06/18/17  POCT glucose (manual entry)  Result Value Ref Range   POC Glucose 117 (A) 70 - 99 mg/dl    ASSESSMENT AND PLAN: 1. Anxiety disorder, unspecified type Pt has not followed through in establishing with a mental health provider and has no showed appts with me after getting RF on Lorazepam. He continues to frequent the ER locally and in other parts of the state. For this reason, I have decided to wean him off Lorazepam and put him on Buspar instead. I have encouraged him to get est with a psychiatrist and therapist at [facility] or at the [facility] where he has an appt on 06/28/2017.    - busPIRone (BUSPAR) 7.5 MG tablet; Take 1 tablet (7.5 mg total) by mouth 2 (two) times daily.  Dispense: 60 tablet; Refill: 1 - LORazepam (ATIVAN) 0.5 MG tablet; 1/2 tab twice a day x 7 days then 1/2 tab once a day x  8 days.  Dispense: 11 tablet; Refill: 0  2. Prediabetes - POCT glucose (manual entry)  3. Essential hypertension -at goal  4. Spondylosis of cervical region without myelopathy or radiculopathy -advise to fill rxn for Mobic and start taking with the Robaxin  Patient was given the opportunity to ask questions.  Patient verbalized understanding of the plan and was able to repeat key elements of the plan.   Orders Placed This Encounter  Procedures  . POCT glucose (manual entry)     Requested Prescriptions   Signed Prescriptions Disp Refills  . busPIRone (BUSPAR) 7.5 MG tablet 60 tablet 1    Sig: Take 1 tablet (7.5 mg total) by mouth 2 (two) times daily.  Marland Kitchen LORazepam (ATIVAN) 0.5 MG tablet 11 tablet 0    Sig: 1/2 tab twice a day x 7 days then 1/2 tab once a day x  8 days.    Return in about 2 weeks (around 07/02/2017).  Jonah Blue, MD, FACP

## 2017-06-18 NOTE — Telephone Encounter (Signed)
Met with the patient when he was in the clinic today to see Dr Wynetta Emery. He was accompanied by his male friend who is with him at most appointments and he said she  accompanies him to his ED visits    He spoke about his anxiety and difficulty sleeping.  Re-enforced with him the importance of following up with the [facility] counselor. The patient stated that he missed his appointment at [facility] because he forgot.  He then shared with this CM a business card from [facility]. He stated that he has an appointment with a therapist there on 06/28/17 @ 1000.  He then noted that he has an appointment with the psychiatrist there at the end of the month. This CM called [place], [place],  [place] [phone].  Spoke to [place] who stated that their clinic only accepts private insurance and medicare. The patient stated that he understands that he does not have insurance and would need to pay $20/therapist visit and $85/ doctor visit at the center.  This CM reviewed with him when to go to ED and when to follow up at [facility]. Encouraged him to call [facility] if has questions about his medications and the information can be shared with his PCP. It can be difficult to effectively manage his medications when he is seeing multiple providers in the EDs. This CM again stressed the importance of behavioral health follow up as he has difficulty managing his anxiety.   His friend then asked this CM and Marvetta Gibbons, [place] speaking, about the patient's concerns regarding his anxiety, HTN, and his doctor cutting back on his pain medication. Informed her that the patient has been instructed what to do and it is important that he follow up with the providers as instructed.

## 2017-06-18 NOTE — ED Provider Notes (Signed)
[facility]-EMERGENCY DEPT Provider Note   CSN: [id number] Arrival date & time: 06/17/17  2311     History   Chief Complaint Chief Complaint  Patient presents with  . Anxiety  . Neck Pain    HPI Wesley [name] is a 51 y.o. male.  51 year old male with a history of chronic neck pain and anxiety presents to the emergency department for pain to the left side of his neck and posterior shoulder. Patient states that he felt his symptoms this evening and the pain made it difficult for him to sleep. Patient notes that onset of discomfort caused secondary palpitations similar to past episodes of anxiety. Patient denies any falls, trauma, injury. He is scheduled to follow-up with his primary care doctor today, Monday.  This is the patient's 80th visit to the emergency department in the past 6 months for various complaints.   The history is provided by the patient. No language interpreter was used.  Anxiety   Neck Pain      Past Medical History:  Diagnosis Date  . Alcohol abuse   . Anxiety   . Anxiety   . Chronic neck pain   . Cocaine abuse (HCC)   . Diabetes mellitus without complication (HCC)   . Hypertension     Patient Active Problem List   Diagnosis Date Noted  . Legally blind in right eye, as defined in [place] 05/02/2017  . Prediabetes 04/23/2017  . Muscle spasm 04/23/2017  . Essential hypertension 04/16/2017  . H/O medication noncompliance 04/02/2017  . Vitamin D deficiency 04/02/2017  . DJD (degenerative joint disease) of cervical spine 03/16/2017  . 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 Dizziness 01/23/2017  . Anxiety 11/21/2016    Past Surgical History:  Procedure Laterality Date  . EYE SURGERY Right ~ 1980   "hit my eye"       Home Medications    Prior to Admission medications   Medication Sig Start Date End Date Taking? Authorizing Provider  amLODipine (NORVASC) 5 MG tablet Take 1 tablet (5 mg total) by mouth daily. 02/21/17   Anders Simmonds, PA-C  fluticasone  (FLONASE) 50 MCG/ACT nasal spray Place 2 sprays into both nostrils daily. 06/13/17   Cathie Hoops, Amy V, PA-C  LORazepam (ATIVAN) 0.5 MG tablet Take 1 tablet (0.5 mg total) by mouth 2 (two) times daily as needed for anxiety. for anxiety Patient not taking: Reported on 06/04/2017 05/25/17   Marcine Matar, MD  LORazepam (ATIVAN) 0.5 MG tablet Take 1 tablet (0.5 mg total) by mouth 2 (two) times daily as needed for anxiety. 06/04/17   Marcine Matar, MD  meclizine (ANTIVERT) 25 MG tablet Take 1 tablet (25 mg total) by mouth 3 (three) times daily as needed for dizziness. 05/27/17   Jacalyn Lefevre, MD  meloxicam (MOBIC) 15 MG tablet Take 1 tablet (15 mg total) by mouth daily. 05/30/17   Cathie Hoops, Amy V, PA-C  methocarbamol (ROBAXIN) 500 MG tablet Take 1 tablet (500 mg total) by mouth 2 (two) times daily as needed for muscle spasms. 05/22/17   Marcine Matar, MD  promethazine (PHENERGAN) 25 MG tablet Take 1 tablet (25 mg total) by mouth every 6 (six) hours as needed for nausea or vomiting. 05/27/17   Jacalyn Lefevre, MD  VOLTAREN 1 % GEL Apply 2 g topically 2 (two) times daily as needed. 05/30/17   Belinda Fisher, PA-C    Family History Family History  Problem Relation Age of Onset  . Hypertension Mother   . Hypertension Father  Social History Social History  Substance Use Topics  . Smoking status: Never Smoker  . Smokeless tobacco: Never Used  . Alcohol use No     Comment: 11/21/2016 "last drank 06/2016"; denies      Allergies   Motrin [ibuprofen]; Other; and Zofran [ondansetron hcl]   Review of Systems Review of Systems  Musculoskeletal: Positive for neck pain.   Ten systems reviewed and are negative for acute change, except as noted in the HPI.    Physical Exam Updated Vital Signs BP (!) 124/95 (BP Location: Left Arm)   Pulse 71   Temp 97.8 F (36.6 C) (Oral)   Resp 16   SpO2 (!) 9%   Physical Exam  Constitutional: He is oriented to person, place, and time. He appears well-developed  and well-nourished. No distress.  Nontoxic and in NAD  HENT:  Head: Normocephalic and atraumatic.  Eyes: Conjunctivae and EOM are normal. No scleral icterus.  Neck: Normal range of motion.  Cardiovascular: Normal rate, regular rhythm and intact distal pulses.   Pulmonary/Chest: Effort normal. No respiratory distress. He has no wheezes.  Respirations even and unlabored  Musculoskeletal: Normal range of motion. He exhibits tenderness.       Cervical back: He exhibits tenderness. He exhibits no bony tenderness, no swelling, no deformity, no laceration and no spasm.       Back:  Neurological: He is alert and oriented to person, place, and time. He exhibits normal muscle tone. Coordination normal.  Skin: Skin is warm and dry. No rash noted. He is not diaphoretic. No erythema. No pallor.  Psychiatric: His behavior is normal.  Mildly anxious  Nursing note and vitals reviewed.    ED Treatments / Results  Labs (all labs ordered are listed, but only abnormal results are displayed) Labs Reviewed - No data to display  EKG  EKG Interpretation None       Radiology No results found.  Procedures Procedures (including critical care time)  Medications Ordered in ED Medications  lidocaine (LIDODERM) 5 % 1 patch (1 patch Transdermal Patch Applied 06/18/17 0259)     Initial Impression / Assessment and Plan / ED Course  I have reviewed the triage vital signs and the nursing notes.  Pertinent labs & imaging results that were available during my care of the patient were reviewed by me and considered in my medical decision making (see chart for details).     51 year old male presents for left-sided neck and posterior shoulder pain which is atraumatic and onset. He is neurovascularly intact with reproducible pain on palpation. Suspect musculoskeletal etiology. Will manage with topical Lidoderm patch in the emergency department. Patient advised on outpatient use of NSAIDs. He is scheduled to  see his primary care doctor today. Return precautions discussed and provided. Patient discharged in stable condition with no unaddressed concerns.    Final Clinical Impressions(s) / ED Diagnoses   Final diagnoses:  Trapezius muscle spasm    New Prescriptions Discharge Medication List as of 06/18/2017  2:58 AM       Antony Madura, PA-C 06/18/17 0540    Dione Booze, MD 06/18/17 [phone]

## 2017-06-22 ENCOUNTER — Other Ambulatory Visit: Payer: Self-pay

## 2017-06-22 ENCOUNTER — Encounter: Payer: Self-pay | Admitting: Neurology

## 2017-06-22 ENCOUNTER — Ambulatory Visit (INDEPENDENT_AMBULATORY_CARE_PROVIDER_SITE_OTHER): Payer: Self-pay | Admitting: Neurology

## 2017-06-22 VITALS — BP 122/84 | HR 74 | Ht 66.0 in | Wt 185.0 lb

## 2017-06-22 DIAGNOSIS — M542 Cervicalgia: Secondary | ICD-10-CM

## 2017-06-22 DIAGNOSIS — F419 Anxiety disorder, unspecified: Secondary | ICD-10-CM

## 2017-06-22 DIAGNOSIS — R202 Paresthesia of skin: Secondary | ICD-10-CM

## 2017-06-22 DIAGNOSIS — R6889 Other general symptoms and signs: Secondary | ICD-10-CM

## 2017-06-22 MED ORDER — CYCLOBENZAPRINE HCL 5 MG PO TABS: 5.0000 mg | ORAL_TABLET | Freq: Every evening | ORAL | 3 refills | Status: DC | PRN

## 2017-06-22 NOTE — Progress Notes (Signed)
[name] Neurology Division Clinic Note - Initial Visit   Date: 06/22/17  Wesley [name] MRN: [id number] DOB: November 07, 1965   Dear Dr. Laural Benes:  Thank you for your kind referral of Wesley [name] for consultation of neck pain. Although his history is well known to you, please allow [place] to reiterate it for the purpose of our medical record. The patient was accompanied to the clinic by wife who also provides collateral information.     History of Present Illness: Wesley [name] is a 51 y.o. right-handed Hispanic male with prediabetes, anxiety, hypertension, R eye blindness, and history of alcohol abuse presenting for evaluation of shoulder and neck pain.    He presents today with complains of neck spasm between his scapula and at the base of his neck, which is worse when sleeping.  He has a hard time describing symptoms stating that his it is his "nerves".  This pain occurs daily lasting about 10-minutes and always resolves with Ativan, but only lasts about 4 hours.  Sometimes, he has tingling over the fingers.  He denies any weakness.  He has not done any physical therapy.  He has tried Robaxin which does not help.  He has history of high anxiety which has been documented throughout his chart.  He has 60+ ER visits over the year to Wesley [name] alone.  He is scheduled to see a psychiatrist next week.   He has 25+ year history of alcohol consumption, drinking about 18 beers daily and quit in 2017.  He October 2017, he reports falling off a roof.  CT cervical spine did not show any significant canal or foraminal stenosis.  MRI brain from November 2017 shows post-traumatic bifrontal encephalomalacia, no acute findings.   Out-side paper records, electronic medical record, and images have been reviewed where available and summarized as:  CT cervical spine wo contrast 03/09/2017: 1. No fracture or subluxation. 2. Stable reversal of the normal cervical lordosis. 3. Mild of moderate multilevel  degenerative changes. No significant canal or foraminal stenosis.  MRI brain wo contrast 07/14/2016: 1.  No acute intracranial abnormality. 2. 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 This is typical of posttraumatic injury (e.g. prior closed head injury). 3. Otherwise normal noncontrast MRI appearance of the brain.  Past Medical History:  Diagnosis Date  . Alcohol abuse   . Anxiety   . Anxiety   . Chronic neck pain   . Cocaine abuse (HCC)   . Diabetes mellitus without complication (HCC)   . Hypertension     Past Surgical History:  Procedure Laterality Date  . EYE SURGERY Right ~ 1980   "hit my eye"     Medications:  Outpatient Encounter Prescriptions as of 06/22/2017  Medication Sig Note  . amLODipine (NORVASC) 5 MG tablet Take 1 tablet (5 mg total) by mouth daily.   . busPIRone (BUSPAR) 7.5 MG tablet Take 1 tablet (7.5 mg total) by mouth 2 (two) times daily.   Marland Kitchen LORazepam (ATIVAN) 0.5 MG tablet Take 1 tablet (0.5 mg total) by mouth 2 (two) times daily as needed for anxiety. for anxiety 06/04/2017: Out of mediction  . meclizine (ANTIVERT) 25 MG tablet Take 1 tablet (25 mg total) by mouth 3 (three) times daily as needed for dizziness.   . methocarbamol (ROBAXIN) 500 MG tablet Take 1 tablet (500 mg total) by mouth 2 (two) times daily as needed for muscle spasms.   . cyclobenzaprine (FLEXERIL) 5 MG tablet Take 1 tablet (5 mg total)  by mouth at bedtime as needed for muscle spasms.   . [DISCONTINUED] fluticasone (FLONASE) 50 MCG/ACT nasal spray Place 2 sprays into both nostrils daily.   . [DISCONTINUED] LORazepam (ATIVAN) 0.5 MG tablet Take 1 tablet (0.5 mg total) by mouth 2 (two) times daily as needed for anxiety.   . [DISCONTINUED] LORazepam (ATIVAN) 0.5 MG tablet 1/2 tab twice a day x 7 days then 1/2 tab once a day x  8 days.   . [DISCONTINUED] meloxicam (MOBIC) 15 MG tablet Take 1 tablet (15 mg total) by mouth daily.   .  [DISCONTINUED] promethazine (PHENERGAN) 25 MG tablet Take 1 tablet (25 mg total) by mouth every 6 (six) hours as needed for nausea or vomiting.   . [DISCONTINUED] VOLTAREN 1 % GEL Apply 2 g topically 2 (two) times daily as needed.    No facility-administered encounter medications on file as of 06/22/2017.      Allergies:  Allergies  Allergen Reactions  . Motrin [Ibuprofen] Other (See Comments)    Makes his throat get "very dry" after taking it  . Other Other (See Comments)    Steroids: Patient received a steroid shot from [facility] (336) [phone] and it resulted in his muscles twitching all over his body  . Zofran [Ondansetron Hcl] Other (See Comments)    Causes sweating and body "feels badly"    Family History: Family History  Problem Relation Age of Onset  . Hypertension Mother   . Hypertension Father     Social History: Social History  Substance Use Topics  . Smoking status: Never Smoker  . Smokeless tobacco: Never Used  . Alcohol use No     Comment: 11/21/2016 "last drank 06/2016"; denies    Social History   Social History Narrative  . No narrative on file    Review of Systems:  CONSTITUTIONAL: No fevers, chills, night sweats, or weight loss.   EYES: No visual changes or eye pain ENT: No hearing changes.  No history of nose bleeds.   RESPIRATORY: No cough, wheezing and shortness of breath.   CARDIOVASCULAR: Negative for chest pain, and palpitations.   GI: Negative for abdominal discomfort, blood in stools or black stools.  No recent change in bowel habits.   GU:  No history of incontinence.   MUSCLOSKELETAL: No history of joint pain or swelling.  No myalgias.   SKIN: Negative for lesions, rash, and itching.   HEMATOLOGY/ONCOLOGY: Negative for prolonged bleeding, bruising easily, and swollen nodes.  No history of cancer.   ENDOCRINE: Negative for cold or heat intolerance, polydipsia or goiter.   PSYCH:  No depression ++anxiety symptoms.   NEURO: As  Above.   Vital Signs:  BP 122/84   Pulse 74   Ht  (1.676 m)   Wt 185 lb (83.9 kg)   SpO2 98%   BMI 29.86 kg/m    General Medical Exam:   General:  Well appearing, comfortable.   Eyes/ENT: see cranial nerve examination.   Neck: No masses appreciated.  Full range of motion without tenderness.  No carotid bruits. Respiratory:  Clear to auscultation, good air entry bilaterally.   Cardiac:  Regular rate and rhythm, no murmur.   Extremities:  No deformities, edema, or skin discoloration.  Skin:  No rashes or lesions.  Neurological Exam: MENTAL STATUS including orientation to time, place, person, recent and remote memory, attention span and concentration, language, and fund of knowledge is normal.  Speech is not dysarthric.  CRANIAL NERVES: II:  No visual field defects.  Unremarkable fundi.   III-IV-VI: Pupils equal round and reactive to light.  Normal conjugate, extra-ocular eye movements in all directions of gaze.  No nystagmus.  No ptosis.   V:  Normal facial sensation.    VII:  Normal facial symmetry and movements.   VIII:  Normal hearing and vestibular function.   IX-X:  Normal palatal movement.   XI:  Normal shoulder shrug and head rotation.   XII:  Normal tongue strength and range of motion, no deviation or fasciculation.  MOTOR:  No atrophy, fasciculations or abnormal movements.  No pronator drift.  Tone is normal.    Right Upper Extremity:    Left Upper Extremity:    Deltoid  5/5   Deltoid  5/5   Biceps  5/5   Biceps  5/5   Triceps  5/5   Triceps  5/5   Wrist extensors  5/5   Wrist extensors  5/5   Wrist flexors  5/5   Wrist flexors  5/5   Finger extensors  5/5   Finger extensors  5/5   Finger flexors  5/5   Finger flexors  5/5   Dorsal interossei  5/5   Dorsal interossei  5/5   Abductor pollicis  5/5   Abductor pollicis  5/5   Tone (Ashworth scale)  0  Tone (Ashworth scale)  0   Right Lower Extremity:    Left Lower Extremity:    Hip flexors  5/5   Hip flexors   5/5   Hip extensors  5/5   Hip extensors  5/5   Knee flexors  5/5   Knee flexors  5/5   Knee extensors  5/5   Knee extensors  5/5   Dorsiflexors  5/5   Dorsiflexors  5/5   Plantarflexors  5/5   Plantarflexors  5/5   Toe extensors  5/5   Toe extensors  5/5   Toe flexors  5/5   Toe flexors  5/5   Tone (Ashworth scale)  0  Tone (Ashworth scale)  0   MSRs:  Right                                                                 Left brachioradialis 2+  brachioradialis 2+  biceps 2+  biceps 2+  triceps 2+  triceps 2+  patellar 2+  patellar 2+  ankle jerk 2+  ankle jerk 2+  Hoffman no  Hoffman no  plantar response down  plantar response down   SENSORY:  Normal and symmetric perception of light touch, pinprick, vibration, and proprioception. There is no sensory level.  COORDINATION/GAIT: Normal finger-to- nose-finger. Gait narrow based and stable.  IMPRESSION: 1.  Musculoskeletal pain of the shoulders and neck   - Start flexeril  at bedtime  - Physical therapy declined  2.  Bilateral hand paresthesias  - NCS/EMG of the arms  - Check vitamin B12 and vitamin B1 given his history of alcohol abuse  3.  High anxiety with multiple somatic complaints.  He is scheduled to see psychiatry next week.    The duration of this appointment visit was 40 minutes of face-to-face time with the patient.  Greater than 50% of this time was spent in counseling, explanation of diagnosis,  planning of further management, and coordination of care.   Thank you for allowing me to participate in patient's care.  If I can answer any additional questions, I would be pleased to do so.    Sincerely,    Donika K. Allena Katz, DO

## 2017-06-22 NOTE — Patient Instructions (Addendum)
Check blood work. Your provider has requested that you have labwork completed today. Please go to [facility] ([address]) on the second floor of this building before leaving the office today. You do not need to check in. If you are not called within 15 minutes please check with the front desk.   NCS/EMG of the arms. We will schedule you for this testing in our office.   Start flexeril  at bedtime  We will call you with the results

## 2017-06-26 ENCOUNTER — Telehealth: Payer: Self-pay

## 2017-06-26 ENCOUNTER — Ambulatory Visit (HOSPITAL_COMMUNITY)
Admission: EM | Admit: 2017-06-26 | Discharge: 2017-06-26 | Disposition: A | Discharge status: Home / Self Care | Payer: Self-pay | Attending: Family Medicine | Admitting: Family Medicine

## 2017-06-26 ENCOUNTER — Other Ambulatory Visit: Payer: Self-pay | Admitting: Internal Medicine

## 2017-06-26 ENCOUNTER — Encounter (HOSPITAL_COMMUNITY): Payer: Self-pay | Admitting: *Deleted

## 2017-06-26 ENCOUNTER — Ambulatory Visit: Payer: Self-pay | Attending: Internal Medicine | Admitting: *Deleted

## 2017-06-26 ENCOUNTER — Telehealth: Payer: Self-pay | Admitting: Internal Medicine

## 2017-06-26 VITALS — BP 131/87 | HR 88 | Resp 16

## 2017-06-26 DIAGNOSIS — F411 Generalized anxiety disorder: Secondary | ICD-10-CM

## 2017-06-26 DIAGNOSIS — Z0130 Encounter for examination of blood pressure without abnormal findings: Secondary | ICD-10-CM | POA: Insufficient documentation

## 2017-06-26 DIAGNOSIS — I10 Essential (primary) hypertension: Secondary | ICD-10-CM

## 2017-06-26 LAB — VITAMIN B12: Vitamin B-12: 401 pg/mL (ref 200–1100)

## 2017-06-26 LAB — VITAMIN B1: Vitamin B1 (Thiamine): 9 nmol/L (ref 8–30)

## 2017-06-26 MED ORDER — LORAZEPAM 0.5 MG PO TABS: 0.5000 mg | ORAL_TABLET | Freq: Three times a day (TID) | ORAL | 0 refills | Status: DC | PRN

## 2017-06-26 NOTE — Telephone Encounter (Signed)
Met with the patient when he was in the clinic today for a BP check.  Reviewed with him the orders that Dr Wynetta Emery had prescribed to wean him off of the lorazepam.  He stated that he did not take as ordered and took 1 pill at a time and is out of pills. He just started the buspar this morning. He stated that he needs the lorazepam to manage his anxiety.   He did not think the buspar was working but had only taken it once. Explained to him that he had been taking the lorazepam but continues to seek medical attention in various EDs.  Re-enforced the importance of following his PCP orders to best manage his care.  Also stressed the importance of following up with behavioral health. He has an appointment at the [facility] in [place] on 07/11/17. Provided him with the phone # and address for [place] and encouraged him to contact them if he feels he needs assistance with managing his anxiety prior to his 07/11/17 appointment at the [facility].

## 2017-06-26 NOTE — Telephone Encounter (Signed)
Pt need a refill for LORazepam (ATIVAN) 0.5 MG tablet  Please follow up

## 2017-06-26 NOTE — ED Triage Notes (Addendum)
Patient reports left shoulder pain and dizziness. [facility] and had his BP checked, his doctor is on vacation. Patient appears in no distress and was talking on phone using left arm when called for triage. Patient is taking antivert and flexeril.

## 2017-06-26 NOTE — ED Provider Notes (Signed)
  [facility]   [id number] 06/26/17 Arrival Time: 1142  ASSESSMENT & PLAN:  1. Generalized anxiety disorder     Meds ordered this encounter  Medications  . LORazepam (ATIVAN) 0.5 MG tablet    Sig: Take 1 tablet (0.5 mg total) by mouth every 8 (eight) hours as needed for anxiety. for anxiety    Dispense:  15 tablet    Refill:  0   Medication sedation precautions. Discussed short-term only use of Ativan. He reports that he has f/u with his PCP in two weeks. Reviewed expectations re: course of current medical issues. Questions answered. Outlined signs and symptoms indicating need for more acute intervention. Patient verbalized understanding. After Visit Summary given.   SUBJECTIVE:  Wesley [name] is a 51 y.o. male who presents with complaint of:  Anxiety: Patient complains of anxiety disorder. He has the following symptoms: chest pain, difficulty concentrating, palpitations, shortness of breath. Onset of symptoms was approximately a few days ago, off and on since that time. He denies current suicidal and homicidal ideation. Family history significant for no psychiatric illness. Has started buspirone (for one week). Unsure if helping. Has used prn Ativan in the past with help.   ROS: As per HPI.   OBJECTIVE:  Vitals:   06/26/17 1241  BP: 137/80  Pulse: 85  Resp: 16  Temp: 98 F (36.7 C)  TempSrc: Oral  SpO2: 100%    General appearance: alert; no distress Eyes: PERRLA; EOMI; conjunctiva normal HENT: normocephalic; atraumatic Neck: supple Lungs: clear to auscultation bilaterally Heart: regular rate and rhythm Abdomen: soft, non-tender  Extremities: no cyanosis or edema; symmetrical with no gross deformities Skin: warm and dry Neurologic: normal gait; normal symmetric reflexes Psychological: alert and cooperative; normal mood and affect   Allergies  Allergen Reactions  . Motrin [Ibuprofen] Other (See Comments)    Makes his throat get "very dry" after  taking it  . Other Other (See Comments)    Steroids: Patient received a steroid shot from [facility] (336) [phone] and it resulted in his muscles twitching all over his body  . Zofran [Ondansetron Hcl] Other (See Comments)    Causes sweating and body "feels badly"    Past Medical History:  Diagnosis Date  . Alcohol abuse   . Anxiety   . Anxiety   . Chronic neck pain   . Cocaine abuse (HCC)   . Diabetes mellitus without complication (HCC)   . Hypertension    Social History   Social History  . Marital status: Significant Other    Spouse name: N/A  . Number of children: N/A  . Years of education: N/A   Occupational History  . Not on file.   Social History Main Topics  . Smoking status: Never Smoker  . Smokeless tobacco: Never Used  . Alcohol use No     Comment: 11/21/2016 "last drank 06/2016"; denies   . Drug use: Yes     Comment: denies, reports past history   . Sexual activity: Not on file   Other Topics Concern  . Not on file   Social History Narrative  . No narrative on file   Family History  Problem Relation Age of Onset  . Hypertension Mother   . Hypertension Father    Past Surgical History:  Procedure Laterality Date  . EYE SURGERY Right ~ 1980   "hit my eye"      Mardella Layman, MD 06/26/17 1318

## 2017-06-26 NOTE — Telephone Encounter (Addendum)
Opened in error

## 2017-06-26 NOTE — Progress Notes (Signed)
Pt came into [facility] to request medication RF: lorazepam.  He also request that blood pressure be taken at this time.  Informed that his PCP is not in the office today.   Reviewed medication with patient.

## 2017-06-26 NOTE — Telephone Encounter (Signed)
Review of his PCP note indicates that 4 days ago he received lorazepam at his office visit and this was supposed to be tapered off over 15 days till 07/03/17. He will have to wait for his PCP.

## 2017-06-26 NOTE — Telephone Encounter (Signed)
Informed patient that he is supposed to decrease dose to wean from Lorazepam while taking Buspar. Pt advised to come to clinic to explain.

## 2017-06-29 ENCOUNTER — Ambulatory Visit: Payer: Self-pay | Admitting: Neurology

## 2017-06-29 ENCOUNTER — Emergency Department (HOSPITAL_COMMUNITY)
Admission: EM | Admit: 2017-06-29 | Discharge: 2017-06-30 | Disposition: A | Discharge status: Home / Self Care | Payer: Self-pay | Attending: Emergency Medicine | Admitting: Emergency Medicine

## 2017-06-29 ENCOUNTER — Encounter (HOSPITAL_COMMUNITY): Payer: Self-pay | Admitting: Emergency Medicine

## 2017-06-29 DIAGNOSIS — R1031 Right lower quadrant pain: Secondary | ICD-10-CM | POA: Insufficient documentation

## 2017-06-29 DIAGNOSIS — F419 Anxiety disorder, unspecified: Secondary | ICD-10-CM | POA: Insufficient documentation

## 2017-06-29 DIAGNOSIS — E119 Type 2 diabetes mellitus without complications: Secondary | ICD-10-CM | POA: Insufficient documentation

## 2017-06-29 DIAGNOSIS — Z79899 Other long term (current) drug therapy: Secondary | ICD-10-CM | POA: Insufficient documentation

## 2017-06-29 DIAGNOSIS — I10 Essential (primary) hypertension: Secondary | ICD-10-CM | POA: Insufficient documentation

## 2017-06-29 NOTE — ED Triage Notes (Signed)
Patient c/o right sided abd pain that started two hours ago. Patient denies n/v/d or constipation or urinary problems.

## 2017-06-30 LAB — COMPREHENSIVE METABOLIC PANEL
ALT: 20 U/L (ref 17–63)
AST: 19 U/L (ref 15–41)
Albumin: 4 g/dL (ref 3.5–5.0)
Alkaline Phosphatase: 71 U/L (ref 38–126)
Anion gap: 7 (ref 5–15)
BUN: 14 mg/dL (ref 6–20)
CO2: 27 mmol/L (ref 22–32)
Calcium: 9.1 mg/dL (ref 8.9–10.3)
Chloride: 104 mmol/L (ref 101–111)
Creatinine, Ser: 0.94 mg/dL (ref 0.61–1.24)
GFR calc Af Amer: >60 mL/min (ref >60)
GFR calc non Af Amer: >60 mL/min (ref >60)
Glucose, Bld: 112 mg/dL — ABNORMAL HIGH (ref 65–99)
Potassium: 3.9 mmol/L (ref 3.5–5.1)
Sodium: 138 mmol/L (ref 135–145)
Total Bilirubin: 0.4 mg/dL (ref 0.3–1.2)
Total Protein: 7.7 g/dL (ref 6.5–8.1)

## 2017-06-30 LAB — CBC
HCT: 40.5 % (ref 39.0–52.0)
Hemoglobin: 13.3 g/dL (ref 13.0–17.0)
MCH: 27.4 pg (ref 26.0–34.0)
MCHC: 32.8 g/dL (ref 30.0–36.0)
MCV: 83.3 fL (ref 78.0–100.0)
Platelets: 260 10*3/uL (ref 150–400)
RBC: 4.86 MIL/uL (ref 4.22–5.81)
RDW: 13.4 % (ref 11.5–15.5)
WBC: 7.2 10*3/uL (ref 4.0–10.5)

## 2017-06-30 NOTE — Discharge Instructions (Signed)
Take Tylenol or ibuprofen for abdominal pain. Continue your daily medications. Follow-up with your primary care doctor regarding your visit today.

## 2017-06-30 NOTE — ED Notes (Signed)
Pt called multiple times for room without no response from lobby. Pt returns to registration and ask if we had called him for room. Pt was sleeping in lobby

## 2017-06-30 NOTE — ED Provider Notes (Signed)
[facility]-EMERGENCY DEPT Provider Note   CSN: [id number] Arrival date & time: 06/29/17  4098     History   Chief Complaint Chief Complaint  Patient presents with  . Abdominal Pain    HPI Wesley [name] is a 51 y.o. male.  51 year old male, well-known to the emergency department, presents to the emergency department today for multiple complaints. He states that he experienced right lower quadrant abdominal pain earlier today. This has spontaneously subsided. He denies any radiation of the pain. No associated nausea, vomiting, diarrhea, fevers.No history of abdominal surgeries. He also complains of being awoken from sleep by a "panic attack". While having this panic attack, he reports a throbbing sensation in his left ear. He states this has happened on one prior occasion. He has no complaints of otalgia or tinnitus at this time. He is scheduled to see his primary care doctor in 3 weeks.   The history is provided by the patient. No language interpreter was used.  Abdominal Pain      Past Medical History:  Diagnosis Date  . Alcohol abuse   . Anxiety   . Anxiety   . Chronic neck pain   . Cocaine abuse (HCC)   . Diabetes mellitus without complication (HCC)   . Hypertension     Patient Active Problem List   Diagnosis Date Noted  . Legally blind in right eye, as defined in [place] 05/02/2017  . Prediabetes 04/23/2017  . Muscle spasm 04/23/2017  . Essential hypertension 04/16/2017  . H/O medication noncompliance 04/02/2017  . Vitamin D deficiency 04/02/2017  . DJD (degenerative joint disease) of cervical spine 03/16/2017  . 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 Dizziness 01/23/2017  . Anxiety 11/21/2016    Past Surgical History:  Procedure Laterality Date  . EYE SURGERY Right ~ 1980   "hit my eye"       Home Medications    Prior to Admission medications   Medication Sig Start Date End Date Taking? Authorizing Provider  amLODipine (NORVASC) 5 MG tablet Take  1 tablet (5 mg total) by mouth daily. 02/21/17   Anders Simmonds, PA-C  busPIRone (BUSPAR) 7.5 MG tablet Take 1 tablet (7.5 mg total) by mouth 2 (two) times daily. 06/18/17   Marcine Matar, MD  cyclobenzaprine (FLEXERIL) 5 MG tablet Take 1 tablet (5 mg total) by mouth at bedtime as needed for muscle spasms. 06/22/17   Patel, Donika K, DO  LORazepam (ATIVAN) 0.5 MG tablet Take 1 tablet (0.5 mg total) by mouth every 8 (eight) hours as needed for anxiety. for anxiety 06/26/17   Mardella Layman, MD  meclizine (ANTIVERT) 25 MG tablet Take 1 tablet (25 mg total) by mouth 3 (three) times daily as needed for dizziness. 05/27/17   Jacalyn Lefevre, MD    Family History Family History  Problem Relation Age of Onset  . Hypertension Mother   . Hypertension Father     Social History Social History  Substance Use Topics  . Smoking status: Never Smoker  . Smokeless tobacco: Never Used  . Alcohol use No     Comment: 11/21/2016 "last drank 06/2016"; denies      Allergies   Motrin [ibuprofen]; Other; and Zofran [ondansetron hcl]   Review of Systems Review of Systems  Gastrointestinal: Positive for abdominal pain.  Ten systems reviewed and are negative for acute change, except as noted in the HPI.    Physical Exam Updated Vital Signs BP (!) 131/91 (BP Location: Right Arm)   Pulse 88  Temp 97.9 F (36.6 C) (Oral)   Resp 18   SpO2 96%   Physical Exam  Constitutional: He is oriented to person, place, and time. He appears well-developed and well-nourished. No distress.  Sleeping on initial assessment. In NAD upon waking.  HENT:  Head: Normocephalic and atraumatic.  Right Ear: Tympanic membrane, external ear and ear canal normal.  Left Ear: Tympanic membrane, external ear and ear canal normal.  Eyes: Conjunctivae and EOM are normal. No scleral icterus.  Neck: Normal range of motion.  Cardiovascular: Normal rate, regular rhythm and intact distal pulses.   Pulmonary/Chest: Effort normal.  No respiratory distress. He has no wheezes. He has no rales.  Respirations even and unlabored  Abdominal: Soft. He exhibits no distension and no mass. There is no tenderness. There is no guarding.  Soft, obese, nontender abdomen. No palpable masses or peritoneal signs.  Musculoskeletal: Normal range of motion.  Neurological: He is alert and oriented to person, place, and time. He exhibits normal muscle tone. Coordination normal.  GCS 15. Speech is goal oriented. Patient moving all extremities.  Skin: Skin is warm and dry. No rash noted. He is not diaphoretic. No erythema. No pallor.  Psychiatric: He has a normal mood and affect. His behavior is normal.  Nursing note and vitals reviewed.    ED Treatments / Results  Labs (all labs ordered are listed, but only abnormal results are displayed) Labs Reviewed  COMPREHENSIVE METABOLIC PANEL - Abnormal; Notable for the following:       Result Value   Glucose, Bld 112 (*)    All other components within normal limits  CBC    EKG  EKG Interpretation None       Radiology No results found.  Procedures Procedures (including critical care time)  Medications Ordered in ED Medications - No data to display   Initial Impression / Assessment and Plan / ED Course  I have reviewed the triage vital signs and the nursing notes.  Pertinent labs & imaging results that were available during my care of the patient were reviewed by me and considered in my medical decision making (see chart for details).     51 year old male, well-known to the emergency department with 78 visits in the past 6 months, presents today for complaints of right lower quadrant abdominal pain. Pain was nonradiating and has since spontaneously subsided. Patient has a soft, nontender abdomen. He has had no vomiting or diarrhea. He denies urinary symptoms. Screening labs obtained which are stable compared to baseline one month ago. Given resolution of symptoms, I do not  believe further emergent workup or imaging is indicated.  Patient also reports a panic attack waking him from sleep. He has been seen previously for similar panic attacks. Patient notes a throbbing sensation in his left ear with his panic attack today. This has also spontaneously subsided. No current otalgia or tinnitus. I have encouraged the patient to follow-up with his primary care doctor regarding his visit today. Return precautions discussed and provided. Patient discharged in stable condition with no unaddressed concerns.   Final Clinical Impressions(s) / ED Diagnoses   Final diagnoses:  Right lower quadrant abdominal pain    New Prescriptions New Prescriptions   No medications on file     Antony MaduraHumes, Kelly, Cordelia Poche-C 06/30/17 [id number]    Devoria AlbeKnapp, Iva, MD 06/30/17 [phone]

## 2017-07-02 ENCOUNTER — Emergency Department (HOSPITAL_COMMUNITY)
Admission: EM | Admit: 2017-07-02 | Discharge: 2017-07-02 | Disposition: A | Discharge status: Home / Self Care | Payer: Self-pay | Attending: Emergency Medicine | Admitting: Emergency Medicine

## 2017-07-02 ENCOUNTER — Encounter (HOSPITAL_COMMUNITY): Payer: Self-pay | Admitting: Family Medicine

## 2017-07-02 ENCOUNTER — Ambulatory Visit (HOSPITAL_COMMUNITY)
Admission: EM | Admit: 2017-07-02 | Discharge: 2017-07-02 | Disposition: A | Discharge status: Home / Self Care | Payer: Self-pay | Attending: Family Medicine | Admitting: Family Medicine

## 2017-07-02 ENCOUNTER — Encounter (HOSPITAL_COMMUNITY): Payer: Self-pay | Admitting: *Deleted

## 2017-07-02 DIAGNOSIS — E119 Type 2 diabetes mellitus without complications: Secondary | ICD-10-CM | POA: Insufficient documentation

## 2017-07-02 DIAGNOSIS — F411 Generalized anxiety disorder: Secondary | ICD-10-CM

## 2017-07-02 DIAGNOSIS — Z79899 Other long term (current) drug therapy: Secondary | ICD-10-CM | POA: Insufficient documentation

## 2017-07-02 DIAGNOSIS — F419 Anxiety disorder, unspecified: Secondary | ICD-10-CM

## 2017-07-02 DIAGNOSIS — I10 Essential (primary) hypertension: Secondary | ICD-10-CM

## 2017-07-02 LAB — URINALYSIS, ROUTINE W REFLEX MICROSCOPIC
Bilirubin Urine: NEGATIVE
Glucose, UA: NEGATIVE mg/dL
Hgb urine dipstick: NEGATIVE
Ketones, ur: NEGATIVE mg/dL
Leukocytes, UA: NEGATIVE
Nitrite: NEGATIVE
Protein, ur: NEGATIVE mg/dL
Specific Gravity, Urine: 1.001 — ABNORMAL LOW (ref 1.005–1.030)
pH: 7 (ref 5.0–8.0)

## 2017-07-02 LAB — BASIC METABOLIC PANEL
Anion gap: 10 (ref 5–15)
BUN: 5 mg/dL — ABNORMAL LOW (ref 6–20)
CO2: 26 mmol/L (ref 22–32)
Calcium: 9.4 mg/dL (ref 8.9–10.3)
Chloride: 100 mmol/L — ABNORMAL LOW (ref 101–111)
Creatinine, Ser: 0.71 mg/dL (ref 0.61–1.24)
GFR calc Af Amer: >60 mL/min (ref >60)
GFR calc non Af Amer: >60 mL/min (ref >60)
Glucose, Bld: 102 mg/dL — ABNORMAL HIGH (ref 65–99)
Potassium: 3.6 mmol/L (ref 3.5–5.1)
Sodium: 136 mmol/L (ref 135–145)

## 2017-07-02 LAB — CBC
HCT: 44.1 % (ref 39.0–52.0)
Hemoglobin: 14.6 g/dL (ref 13.0–17.0)
MCH: 27.7 pg (ref 26.0–34.0)
MCHC: 33.1 g/dL (ref 30.0–36.0)
MCV: 83.5 fL (ref 78.0–100.0)
Platelets: 278 10*3/uL (ref 150–400)
RBC: 5.28 MIL/uL (ref 4.22–5.81)
RDW: 13.3 % (ref 11.5–15.5)
WBC: 7.8 10*3/uL (ref 4.0–10.5)

## 2017-07-02 LAB — GLUCOSE, CAPILLARY: Glucose-Capillary: 79 mg/dL (ref 65–99)

## 2017-07-02 NOTE — Discharge Instructions (Signed)
Take your medicine as directed.  Avoid salt in your diet.  Make sure you are drinking plenty of fluids.

## 2017-07-02 NOTE — Discharge Instructions (Signed)
Your blood sugar today was 79. Please keep your follow up appointment with your primary care doctor tomorrow.

## 2017-07-02 NOTE — Care Management (Signed)
ED CM noted patient to has had 58 ED visits and patient is known to this ED CM. ED CM noted that patient was just seen less than an hour ago at the [facility] and the [facility]gent care with similar complaints. [place] have been actively working to keep patient out of the ED for non-emergent issues. ED CM spoke with [facility] CM patient was at the clinic today trying to get prescription for Lorazepam refilled, it was not refilled and was explained that this has not been effective, and he has no tbeen compliant with any other prescribed tx for anxiety. ED CM met with patient in waiting room reminded him of his appointment at the [facility] tomorrow he verbalized understanding and teach back done.  He reports presenting to the ED today with increase dizziness and elevated B/P.  Patient waiting on a room to be evaluated.

## 2017-07-02 NOTE — ED Triage Notes (Signed)
Pt here for dizziness since taking al of his meds this am and feels like BP is elevated.

## 2017-07-02 NOTE — ED Triage Notes (Signed)
Pt reports feeling dizzy and sob since this am. Took all his meds as prescribed. No acute distress is noted at triage. Pt is frequently here for HTN and anxiety.

## 2017-07-02 NOTE — ED Notes (Signed)
Pt ambulatory up to nurse first desk stating he feels like he is going to pass out, pt appears steady on his feet. VS remain stable as documented in patient's chart. Pt reassured we are working on getting a room for him and that there are a few people ahead of him. NAD.

## 2017-07-02 NOTE — ED Provider Notes (Signed)
[facility] EMERGENCY DEPARTMENT Provider Note   CSN: [id number] Arrival date & time: 07/02/17  1355     History   Chief Complaint Chief Complaint  Patient presents with  . Shortness of Breath  . Dizziness    HPI Wesley [name] is a 51 y.o. male.  He presents for evaluation of dizziness, for 2 days, which comes and goes.  No distinct pattern or cause for the dizziness.  He is taking his usual medications, as directed.  He denies nausea, vomiting, fever, chills, cough, focal weakness or paresthesia.  There is been no headache or back pain.  Patient has frequent visits to the emergency department.  There are no other known modifying factors.  HPI  Past Medical History:  Diagnosis Date  . Alcohol abuse   . Anxiety   . Anxiety   . Chronic neck pain   . Cocaine abuse (HCC)   . Diabetes mellitus without complication (HCC)   . Hypertension     Patient Active Problem List   Diagnosis Date Noted  . Legally blind in right eye, as defined in [place] 05/02/2017  . Prediabetes 04/23/2017  . Muscle spasm 04/23/2017  . Essential hypertension 04/16/2017  . H/O medication noncompliance 04/02/2017  . Vitamin D deficiency 04/02/2017  . DJD (degenerative joint disease) of cervical spine 03/16/2017  . 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 Dizziness 01/23/2017  . Anxiety 11/21/2016    Past Surgical History:  Procedure Laterality Date  . EYE SURGERY Right ~ 1980   "hit my eye"       Home Medications    Prior to Admission medications   Medication Sig Start Date End Date Taking? Authorizing Provider  amLODipine (NORVASC) 5 MG tablet Take 1 tablet (5 mg total) by mouth daily. 02/21/17   Anders Simmonds, PA-C  busPIRone (BUSPAR) 7.5 MG tablet Take 1 tablet (7.5 mg total) by mouth 2 (two) times daily. 06/18/17   Marcine Matar, MD  cyclobenzaprine (FLEXERIL) 5 MG tablet Take 1 tablet (5 mg total) by mouth at bedtime as needed for muscle spasms. 06/22/17   Patel, Donika K, DO    LORazepam (ATIVAN) 0.5 MG tablet Take 1 tablet (0.5 mg total) by mouth every 8 (eight) hours as needed for anxiety. for anxiety 06/26/17   Mardella Layman, MD  meclizine (ANTIVERT) 25 MG tablet Take 1 tablet (25 mg total) by mouth 3 (three) times daily as needed for dizziness. 05/27/17   Jacalyn Lefevre, MD    Family History Family History  Problem Relation Age of Onset  . Hypertension Mother   . Hypertension Father     Social History Social History  Substance Use Topics  . Smoking status: Never Smoker  . Smokeless tobacco: Never Used  . Alcohol use No     Comment: 11/21/2016 "last drank 06/2016"; denies      Allergies   Motrin [ibuprofen]; Other; and Zofran [ondansetron hcl]   Review of Systems Review of Systems  All other systems reviewed and are negative.    Physical Exam Updated Vital Signs BP (!) 129/94 (BP Location: Right Arm)   Pulse (!) 112   Temp 98 F (36.7 C) (Oral)   Resp 17   SpO2 98%   Physical Exam  Constitutional: He is oriented to person, place, and time. He appears well-developed and well-nourished.  HENT:  Head: Normocephalic and atraumatic.  Right Ear: External ear normal.  Left Ear: External ear normal.  Eyes: Pupils are equal, round, and reactive to light.  Conjunctivae and EOM are normal.  Neck: Normal range of motion and phonation normal. Neck supple.  Cardiovascular: Normal rate, regular rhythm and normal heart sounds.   Pulmonary/Chest: Effort normal and breath sounds normal. He exhibits no bony tenderness.  Abdominal: Soft. There is no tenderness.  Musculoskeletal: Normal range of motion.  Neurological: He is alert and oriented to person, place, and time. No cranial nerve deficit or sensory deficit. He exhibits normal muscle tone. Coordination normal.  No pronator drift.  No ataxia.  No dysarthria or aphasia.  Skin: Skin is warm, dry and intact.  Psychiatric: He has a normal mood and affect. His behavior is normal. Judgment and thought  content normal.  Nursing note and vitals reviewed.    ED Treatments / Results  Labs (all labs ordered are listed, but only abnormal results are displayed) Labs Reviewed  BASIC METABOLIC PANEL - Abnormal; Notable for the following:       Result Value   Chloride 100 (*)    Glucose, Bld 102 (*)    BUN 5 (*)    All other components within normal limits  URINALYSIS, ROUTINE W REFLEX MICROSCOPIC - Abnormal; Notable for the following:    Color, Urine COLORLESS (*)    Specific Gravity, Urine 1.001 (*)    All other components within normal limits  CBC    EKG  EKG Interpretation  Date/Time:  Monday July 02 2017 14:11:20 EDT Ventricular Rate:  111 PR Interval:  138 QRS Duration: 98 QT Interval:  338 QTC Calculation: 459 R Axis:   -55 Text Interpretation:  Sinus tachycardia Left axis deviation Moderate voltage criteria for LVH, may be normal variant Abnormal ECG since last tracing no significant change Confirmed by Mancel BaleWentz, Elliott [phone]([id number]) on 07/02/2017 6:48:53 PM       Radiology No results found.  Procedures Procedures (including critical care time)  Medications Ordered in ED Medications - No data to display   Initial Impression / Assessment and Plan / ED Course  I have reviewed the triage vital signs and the nursing notes.  Pertinent labs & imaging results that were available during my care of the patient were reviewed by me and considered in my medical decision making (see chart for details).      Patient Vitals for the past 24 hrs:  BP Temp Temp src Pulse Resp SpO2  07/02/17 1752 (!) 129/94 - - (!) 112 17 98 %  07/02/17 1534 (!) 145/95 - - (!) 114 18 100 %  07/02/17 1411 (!) 148/95 98 F (36.7 C) Oral (!) 107 17 100 %    7:05 PM Reevaluation with update and discussion. After initial assessment and treatment, an updated evaluation reveals no further complaints.  Findings discussed with patient, and all questions were answered. WENTZ,ELLIOTT L      Final  Clinical Impressions(s) / ED Diagnoses   Final diagnoses:  Anxiety  Hypertension, unspecified type    Anxiety with nonspecific dizziness.  Mild hypertension, without findings for serious endorgan damage, ACS, metabolic instability or suggestion for impending vascular collapse.  Nursing Notes Reviewed/ Care Coordinated Applicable Imaging Reviewed Interpretation of Laboratory Data incorporated into ED treatment  The patient appears reasonably screened and/or stabilized for discharge and I doubt any other medical condition or other [facility] requiring further screening, evaluation, or treatment in the ED at this time prior to discharge.  Plan: Home Medications-continue current medications; Home Treatments-rest, low-sodium diet; return here if the recommended treatment, does not improve the symptoms; Recommended follow up-PCP checkup  as needed.  Also recommended blood pressure check in 1 week.  New Prescriptions New Prescriptions   No medications on file     Mancel Bale, MD 07/02/17 1610

## 2017-07-03 ENCOUNTER — Emergency Department (HOSPITAL_COMMUNITY)
Admission: EM | Admit: 2017-07-03 | Discharge: 2017-07-03 | Disposition: A | Discharge status: Home / Self Care | Payer: Self-pay | Attending: Emergency Medicine | Admitting: Emergency Medicine

## 2017-07-03 ENCOUNTER — Telehealth: Payer: Self-pay | Admitting: *Deleted

## 2017-07-03 ENCOUNTER — Ambulatory Visit: Payer: Self-pay | Attending: Internal Medicine | Admitting: Internal Medicine

## 2017-07-03 ENCOUNTER — Encounter: Payer: Self-pay | Admitting: Internal Medicine

## 2017-07-03 ENCOUNTER — Ambulatory Visit: Payer: Self-pay | Admitting: Neurology

## 2017-07-03 ENCOUNTER — Encounter: Payer: Self-pay | Admitting: *Deleted

## 2017-07-03 VITALS — BP 143/89 | HR 74 | Temp 98.4°F | Resp 16 | Wt 190.6 lb

## 2017-07-03 DIAGNOSIS — Z1331 Encounter for screening for depression: Secondary | ICD-10-CM | POA: Insufficient documentation

## 2017-07-03 DIAGNOSIS — Z888 Allergy status to other drugs, medicaments and biological substances status: Secondary | ICD-10-CM | POA: Insufficient documentation

## 2017-07-03 DIAGNOSIS — R42 Dizziness and giddiness: Secondary | ICD-10-CM | POA: Insufficient documentation

## 2017-07-03 DIAGNOSIS — Z79899 Other long term (current) drug therapy: Secondary | ICD-10-CM | POA: Insufficient documentation

## 2017-07-03 DIAGNOSIS — Z5321 Procedure and treatment not carried out due to patient leaving prior to being seen by health care provider: Secondary | ICD-10-CM | POA: Insufficient documentation

## 2017-07-03 DIAGNOSIS — F419 Anxiety disorder, unspecified: Secondary | ICD-10-CM | POA: Insufficient documentation

## 2017-07-03 DIAGNOSIS — I10 Essential (primary) hypertension: Secondary | ICD-10-CM | POA: Insufficient documentation

## 2017-07-03 DIAGNOSIS — R079 Chest pain, unspecified: Secondary | ICD-10-CM | POA: Insufficient documentation

## 2017-07-03 DIAGNOSIS — R7303 Prediabetes: Secondary | ICD-10-CM | POA: Insufficient documentation

## 2017-07-03 LAB — GLUCOSE, POCT (MANUAL RESULT ENTRY): POC Glucose: 105 mg/dl — AB (ref 70–99)

## 2017-07-03 MED ORDER — MIRTAZAPINE 15 MG PO TABS: 7.5000 mg | ORAL_TABLET | Freq: Every day | ORAL | 1 refills | Status: DC

## 2017-07-03 MED ORDER — LORAZEPAM 0.5 MG PO TABS: 0.5000 mg | ORAL_TABLET | Freq: Two times a day (BID) | ORAL | 0 refills | Status: DC

## 2017-07-03 MED ORDER — AMLODIPINE BESYLATE 5 MG PO TABS: 7.5000 mg | ORAL_TABLET | Freq: Every day | ORAL | 2 refills | Status: DC

## 2017-07-03 MED FILL — AMLODIPINE BESYLATE 5 MG TA: 5 | 30 days supply | Qty: 45 | Fill #0

## 2017-07-03 MED FILL — ?MIRTAZAPINE 15 MG TABLET: 15 | 30 days supply | Qty: 15 | Fill #0

## 2017-07-03 NOTE — ED Triage Notes (Signed)
Pt called to treatment area. No one responded to call

## 2017-07-03 NOTE — Telephone Encounter (Signed)
Attempted to contact patient yesterday.  Patient did not show for EMG.  Letter sent informing him of normal lab results.

## 2017-07-03 NOTE — ED Triage Notes (Signed)
Patient states he has dizziness and anxiety. And the right side of his chest hurts. He called his doctor today who told him to take his anxiety medication. He took lorazepam at 0500 and 30 mins ago with no relief. Pt has a care plan and is frequently here for the same symptoms. VSS. Pt ambulatory, A&Ox4, wife with patient.

## 2017-07-03 NOTE — Progress Notes (Signed)
Patient ID: Wesley [name], male    DOB: Feb 05, 1966  MRN: [id number]  CC: Follow-up (ED)   Subjective: Wesley [name] is a 51 y.o. male who presents for f/u visit. His concerns today include:  Hx pre-dm, anxiety, HTN, cervical spondylosis  He has the following med bottles with WUJ:WJXBJYN 500 mg BID, Norvasc 5 mg, Meclizine and Buspar.  1. Anxiety:  On last visit with me, we spoke about weaning of Lorazepam since he continues to go to ER and did not follow through in establishing with a mental health provider. Given a tapering schedule but he did not follow it and ran out of the Lorazepam early and I was on vacation. Went to UC and got RF there for 15 tabs. Taking Buspar once a day instead of BID. Feels it is not helping him -Has appt with [facility] 10/31 and with another group in [place] 10/29.  -seen ED 10/20 with multiple complaints including waking up with panic attack -ED again 10/22 with dizziness which is a chronic complaint for him  2. Continues to obsess about his BP. BP elevated on recent ED visit -he feels the Norvasc is not working for him.   3. Saw neurology but instead of eval for dizziness for which he was referred, they talked about his neck symptoms and numbness in arm. Scheduled to be seen in f/u by neurologist today. He plans to keep that appt -dizziness every day. With standing and with head movement especially if he looks down then up  Still gets a lot of spasms at the back of neck. Takes Robaxin  Patient Active Problem List   Diagnosis Date Noted  . Legally blind in right eye, as defined in [place] 05/02/2017  . Prediabetes 04/23/2017  . Muscle spasm 04/23/2017  . Essential hypertension 04/16/2017  . H/O medication noncompliance 04/02/2017  . Vitamin D deficiency 04/02/2017  . DJD (degenerative joint disease) of cervical spine 03/16/2017  . 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 Dizziness 01/23/2017  . Anxiety 11/21/2016     Current Outpatient Prescriptions on File Prior to  Visit  Medication Sig Dispense Refill  . amLODipine (NORVASC) 5 MG tablet Take 1 tablet (5 mg total) by mouth daily. 90 tablet 3  . busPIRone (BUSPAR) 7.5 MG tablet Take 1 tablet (7.5 mg total) by mouth 2 (two) times daily. 60 tablet 1  . cyclobenzaprine (FLEXERIL) 5 MG tablet Take 1 tablet (5 mg total) by mouth at bedtime as needed for muscle spasms. 30 tablet 3  . LORazepam (ATIVAN) 0.5 MG tablet Take 1 tablet (0.5 mg total) by mouth every 8 (eight) hours as needed for anxiety. for anxiety 15 tablet 0  . meclizine (ANTIVERT) 25 MG tablet Take 1 tablet (25 mg total) by mouth 3 (three) times daily as needed for dizziness. 30 tablet 0   No current facility-administered medications on file prior to visit.     Allergies  Allergen Reactions  . Motrin [Ibuprofen] Other (See Comments)    Makes his throat get "very dry" after taking it  . Other Other (See Comments)    Steroids: Patient received a steroid shot from [facility] (336) [phone] and it resulted in his muscles twitching all over his body  . Zofran [Ondansetron Hcl] Other (See Comments)    Causes sweating and body "feels badly"    Social History   Social History  . Marital status: Significant Other    Spouse name: N/A  . Number of children: N/A  . Years  of education: N/A   Occupational History  . Not on file.   Social History Main Topics  . Smoking status: Never Smoker  . Smokeless tobacco: Never Used  . Alcohol use No     Comment: 11/21/2016 "last drank 06/2016"; denies   . Drug use: Yes     Comment: denies, reports past history   . Sexual activity: Not on file   Other Topics Concern  . Not on file   Social History Narrative  . No narrative on file    Family History  Problem Relation Age of Onset  . Hypertension Mother   . Hypertension Father     Past Surgical History:  Procedure Laterality Date  . EYE SURGERY Right ~ 1980   "hit my eye"    ROS: Review of Systems As stated above PHYSICAL  EXAM: BP (!) 143/89   Pulse 74   Temp 98.4 F (36.9 C) (Oral)   Resp 16   Wt 190 lb 9.6 oz (86.5 kg)   SpO2 99%   BMI 30.76 kg/m   Repeat BP 128/72 Physical Exam General appearance - alert, well appearing, and in no distress Mental status - anxious mood  Neck - supple, no significant adenopathy Chest - clear to auscultation, no wheezes, rales or rhonchi, symmetric air entry Heart - normal rate, regular rhythm, normal S1, S2, no murmurs, rubs, clicks or gallops Neurological - Cns grossly intact. Gait normal, power 5/5 in all 4s. Depression screen [facility] 2/9 07/03/2017 06/18/2017 04/16/2017 02/21/2017 02/08/2017  Decreased Interest 3 3 1 3 3   Down, Depressed, Hopeless 3 2 3 3 3   PHQ - 2 Score 6 5 4 6 6   Altered sleeping 3 3 3 3 2   Tired, decreased energy 3 3 3 3 2   Change in appetite 0 2 1 3  0  Feeling bad or failure about yourself  0 1 2 3 3   Trouble concentrating 3 2 2 3 3   Moving slowly or fidgety/restless 2 2 2 3  0  Suicidal thoughts 1 0 0 3 0  PHQ-9 Score 18 18 17 27 16    Results for orders placed or performed in visit on 07/03/17  POCT glucose (manual entry)  Result Value Ref Range   POC Glucose 105 (A) 70 - 99 mg/dl    ASSESSMENT AND PLAN: 1. Hypertension, unspecified type - amLODipine (NORVASC) 5 MG tablet; Take 1.5 tablets (7.5 mg total) by mouth daily.  Dispense: 45 tablet; Refill: 2  2. Anxiety disorder, unspecified type -advise pt to take the Buspar BID as prescribed, should take about 4-5 wks before he starts feeling effects of med. Will wean off Lorazepam with this time table. I have given Lorazepam enough for 1 wk and he will have to come back Q wk for RF as we wean Add Remeron to help with sleep and decrease night time waking up with anxiety -pt will establish care with a mental health provider this mth - mirtazapine (REMERON) 15 MG tablet; Take 0.5 tablets (7.5 mg total) by mouth at bedtime.  Dispense: 30 tablet; Refill: 1 - LORazepam (ATIVAN) 0.5 MG tablet; Take  1 tablet (0.5 mg total) by mouth 2 (two) times daily. for anxiety  Dispense: 14 tablet; Refill: 0  3. Prediabetes  - POCT glucose (manual entry)  4. Positive depression screening See #1 above  5. Dizziness -pt to keep appt with neurologist today.  I have given a handwritten note to give to neurologist describing his dizziness and request for eval  Addendum: pt left my office and went to ER. He no showed neurology appt.  Patient was given the opportunity to ask questions.  Patient verbalized understanding of the plan and was able to repeat key elements of the plan.   Orders Placed This Encounter  Procedures  . POCT glucose (manual entry)     Requested Prescriptions    No prescriptions requested or ordered in this encounter    No Follow-up on file.  Jonah Blue, MD, FACP

## 2017-07-03 NOTE — Patient Instructions (Signed)
You are being weaned off Lorazepam. Take Buspar twice a day as prescribed.  Start Remeron daily at bedtime.  [facility]

## 2017-07-03 NOTE — Telephone Encounter (Signed)
-----   Message from [place] Chardonika K Patel, DO sent at 07/02/2017  3:40 PM EDT ----- Please notify patient lab are within normal limits.  Thank you.

## 2017-07-03 NOTE — ED Triage Notes (Signed)
Did not come to treatment area when called

## 2017-07-03 NOTE — ED Triage Notes (Signed)
Pt requested that we recheck his BP

## 2017-07-04 NOTE — ED Provider Notes (Signed)
[facility] CARE CENTER   [id number] 07/02/17 Arrival Time: 1220  ASSESSMENT & PLAN:  1. Generalized anxiety disorder    Mckinnon and I had a good discussion regarding the root of his symptoms/anxiety. He reports that he has an appointment with a psychiatrist within the next few weeks. In short, his symptoms began after falling from a roof. He ultimately has significant fear of dying and leaving his son without support. Discussed the benefit of having a support network. He agrees. Has talked to his pastor in the past and will continue to do so. I encouraged him to do so. I am not changing any of his medications today. He has f/u with his PCP tomorrow. Overall I feel he needs to be on treatment for anxiety/depression.  Blood sugar normal. Hopefully this reassures him.  Reviewed expectations re: course of current medical issues. Questions answered. Outlined signs and symptoms indicating need for more acute intervention. Patient verbalized understanding. After Visit Summary given.   SUBJECTIVE:  Wesley [name] is a 51 y.o. male who is becoming well known to me given his frequent visits to the ED/Urgent care. He presents today with complaint of the feeling of dizziness after taking his muscle relaxer and lorazepam today. Symptoms have resolved now but he remains worried about his health. This is the same feeling he experiences frequently. Does not limit him. Admits to being very worried about his health. No CP/SOB/n/v. Ambulatory without difficulty. Requests a blood sugar check today.  ROS: As per HPI.   OBJECTIVE:  Vitals:   07/02/17 1258 07/02/17 1345  BP: (!) 141/99 (!) 144/100  Pulse: 98   Resp: 16   Temp: 98.1 F (36.7 C)   TempSrc: Oral   SpO2: 98%     General appearance: alert; no distress; appears healthy Eyes: PERRLA; EOMI; conjunctiva normal HENT: normocephalic; atraumatic; TMs normal; nasal mucosa normal; oral mucosa normal Neck: supple Lungs: clear to auscultation  bilaterally Heart: regular rate and rhythm Extremities: no cyanosis or edema; symmetrical with no gross deformities Skin: warm and dry Neurologic: normal gait; normal symmetric reflexes Psychological: alert and cooperative; normal mood and affect  Labs: Results for orders placed or performed during the hospital encounter of 07/02/17  Glucose, capillary  Result Value Ref Range   Glucose-Capillary 79 65 - 99 mg/dL   Labs Reviewed  GLUCOSE, CAPILLARY     Allergies  Allergen Reactions  . Motrin [Ibuprofen] Other (See Comments)    Makes his throat get "very dry" after taking it  . Other Other (See Comments)    Steroids: Patient received a steroid shot from [facility] [phone]) [phone] and it resulted in his muscles twitching all over his body  . Zofran [Ondansetron Hcl] Other (See Comments)    Causes sweating and body "feels badly"    Past Medical History:  Diagnosis Date  . Alcohol abuse   . Anxiety   . Anxiety   . Chronic neck pain   . Cocaine abuse (HCC)   . Diabetes mellitus without complication (HCC)   . Hypertension    Social History   Social History  . Marital status: Significant Other    Spouse name: N/A  . Number of children: N/A  . Years of education: N/A   Occupational History  . Not on file.   Social History Main Topics  . Smoking status: Never Smoker  . Smokeless tobacco: Never Used  . Alcohol use No     Comment: 11/21/2016 "last drank 06/2016"; denies   .  Drug use: Yes     Comment: denies, reports past history   . Sexual activity: Not on file   Other Topics Concern  . Not on file   Social History Narrative  . No narrative on file   Family History  Problem Relation Age of Onset  . Hypertension Mother   . Hypertension Father    Past Surgical History:  Procedure Laterality Date  . EYE SURGERY Right ~ 1980   "hit my eye"     Mardella LaymanHagler, Brian, MD 07/04/17 [phone]

## 2017-07-07 ENCOUNTER — Ambulatory Visit (HOSPITAL_COMMUNITY)
Admission: EM | Admit: 2017-07-07 | Discharge: 2017-07-07 | Disposition: A | Discharge status: Home / Self Care | Payer: Self-pay

## 2017-07-07 ENCOUNTER — Emergency Department (HOSPITAL_COMMUNITY)
Admission: EM | Admit: 2017-07-07 | Discharge: 2017-07-07 | Disposition: A | Discharge status: Home / Self Care | Payer: Self-pay | Attending: Emergency Medicine | Admitting: Emergency Medicine

## 2017-07-07 ENCOUNTER — Encounter (HOSPITAL_COMMUNITY): Payer: Self-pay | Admitting: *Deleted

## 2017-07-07 DIAGNOSIS — G44209 Tension-type headache, unspecified, not intractable: Secondary | ICD-10-CM

## 2017-07-07 DIAGNOSIS — I10 Essential (primary) hypertension: Secondary | ICD-10-CM | POA: Insufficient documentation

## 2017-07-07 DIAGNOSIS — Z79899 Other long term (current) drug therapy: Secondary | ICD-10-CM | POA: Insufficient documentation

## 2017-07-07 DIAGNOSIS — E119 Type 2 diabetes mellitus without complications: Secondary | ICD-10-CM | POA: Insufficient documentation

## 2017-07-07 DIAGNOSIS — R42 Dizziness and giddiness: Secondary | ICD-10-CM

## 2017-07-07 DIAGNOSIS — R51 Headache: Secondary | ICD-10-CM | POA: Insufficient documentation

## 2017-07-07 DIAGNOSIS — F419 Anxiety disorder, unspecified: Secondary | ICD-10-CM

## 2017-07-07 MED ORDER — LORAZEPAM 2 MG/ML IJ SOLN
1.0000 mg | Freq: Once | INTRAMUSCULAR | Status: AC
  Administered 2017-07-07: 1 mg via INTRAVENOUS
  Filled 2017-07-07: qty 1

## 2017-07-07 MED ORDER — SODIUM CHLORIDE 0.9 % IV BOLUS (SEPSIS)
1000.0000 mL | Freq: Once | INTRAVENOUS | Status: AC
  Administered 2017-07-07: 1000 mL via INTRAVENOUS

## 2017-07-07 MED ORDER — DIPHENHYDRAMINE HCL 50 MG/ML IJ SOLN
25.0000 mg | Freq: Once | INTRAMUSCULAR | Status: AC
  Administered 2017-07-07: 25 mg via INTRAVENOUS
  Filled 2017-07-07: qty 1

## 2017-07-07 MED ORDER — METOCLOPRAMIDE HCL 5 MG/ML IJ SOLN
10.0000 mg | Freq: Once | INTRAMUSCULAR | Status: AC
  Administered 2017-07-07: 10 mg via INTRAVENOUS
  Filled 2017-07-07: qty 2

## 2017-07-07 NOTE — ED Triage Notes (Signed)
Pt c/o dizziness, pt reports onset 2 hours ago, pt denies pain, pt c/o SOB, pt c/o nausea, denies v/d, pt C/o L sided cp onset an hour ago, pt A&) x4, VAN neg

## 2017-07-07 NOTE — ED Provider Notes (Signed)
[facility] EMERGENCY DEPARTMENT Provider Note CSN: [id number] Arrival date & time: 07/07/17  1226 History   Chief Complaint Chief Complaint  Patient presents with  . Dizziness   HPI Amarri Satterly is a 51 y.o. male with past medical history generalized anxiety who presents with complaints of lightheadedness, dizziness, headache, and high blood pressure.  Patient states symptoms started earlier today.  Patient has had multiple frequent similar episodes of this in the past.  Denies any associated nausea or vomiting.  He has not had any recent fevers or chills.  Denies any unilateral or generalized weakness.  Not had any associated difficulty walking.  Patient describes dizziness as lightheadedness and feeling like she is going to faint.  The history is provided by the patient.  Headache   This is a new problem. The current episode started 3 to 5 hours ago. The problem occurs constantly. The problem has not changed since onset.The headache is associated with emotional stress. The pain is located in the frontal region. The quality of the pain is described as dull. The pain is mild. The pain does not radiate. Pertinent negatives include no fever, no palpitations, no shortness of breath and no vomiting. He has tried nothing for the symptoms.   Past Medical History:  Diagnosis Date  . Alcohol abuse   . Anxiety   . Anxiety   . Chronic neck pain   . Cocaine abuse (HCC)   . Diabetes mellitus without complication (HCC)   . Hypertension    Patient Active Problem List   Diagnosis Date Noted  . Legally blind in right eye, as defined in [place] 05/02/2017  . Prediabetes 04/23/2017  . Muscle spasm 04/23/2017  . Essential hypertension 04/16/2017  . H/O medication noncompliance 04/02/2017  . Vitamin D deficiency 04/02/2017  . DJD (degenerative joint disease) of cervical spine 03/16/2017  . 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 Dizziness 01/23/2017  . Anxiety 11/21/2016   Past Surgical History:    Procedure Laterality Date  . EYE SURGERY Right ~ 1980   "hit my eye"    Home Medications    Prior to Admission medications   Medication Sig Start Date End Date Taking? Authorizing Provider  amLODipine (NORVASC) 5 MG tablet Take 1.5 tablets (7.5 mg total) by mouth daily. 07/03/17   Marcine Matar, MD  busPIRone (BUSPAR) 7.5 MG tablet Take 1 tablet (7.5 mg total) by mouth 2 (two) times daily. 06/18/17   Marcine Matar, MD  cyclobenzaprine (FLEXERIL) 5 MG tablet Take 1 tablet (5 mg total) by mouth at bedtime as needed for muscle spasms. 06/22/17   Patel, Donika K, DO  LORazepam (ATIVAN) 0.5 MG tablet Take 1 tablet (0.5 mg total) by mouth 2 (two) times daily. for anxiety 07/03/17   Marcine Matar, MD  meclizine (ANTIVERT) 25 MG tablet Take 1 tablet (25 mg total) by mouth 3 (three) times daily as needed for dizziness. 05/27/17   Jacalyn Lefevre, MD  mirtazapine (REMERON) 15 MG tablet Take 0.5 tablets (7.5 mg total) by mouth at bedtime. 07/03/17   Marcine Matar, MD   Family History Family History  Problem Relation Age of Onset  . Hypertension Mother   . Hypertension Father    Social History Social History  Substance Use Topics  . Smoking status: Never Smoker  . Smokeless tobacco: Never Used  . Alcohol use No     Comment: 11/21/2016 "last drank 06/2016"; denies    Allergies   Motrin [ibuprofen]; Other; and Zofran [facility]  hcl]  Review of Systems Review of Systems  Constitutional: Negative for chills and fever.  HENT: Negative for ear pain and sore throat.   Eyes: Negative for pain and visual disturbance.  Respiratory: Negative for cough and shortness of breath.   Cardiovascular: Negative for chest pain and palpitations.  Gastrointestinal: Negative for abdominal pain and vomiting.  Genitourinary: Negative for dysuria and hematuria.  Musculoskeletal: Negative for arthralgias and back pain.  Skin: Negative for color change and rash.  Neurological: Positive for  dizziness, light-headedness and headaches. Negative for seizures and syncope.  Psychiatric/Behavioral: The patient is nervous/anxious.   All other systems reviewed and are negative.  Physical Exam Updated Vital Signs BP (!) 140/96 (BP Location: Left Arm)   Pulse (!) 104   Temp 98.1 F (36.7 C) (Oral)   Resp 14   SpO2 100%   Physical Exam  Constitutional: He is oriented to person, place, and time. He appears well-developed and well-nourished.  HENT:  Head: Normocephalic and atraumatic.  Eyes: Pupils are equal, round, and reactive to light. Conjunctivae and EOM are normal.  Neck: Normal range of motion. Neck supple.  Cardiovascular: Regular rhythm.  Tachycardia present.   No murmur heard. Pulmonary/Chest: Effort normal and breath sounds normal. No respiratory distress.  Abdominal: Soft. There is no tenderness.  Musculoskeletal: He exhibits no edema.  Neurological: He is alert and oriented to person, place, and time. He displays normal reflexes. No cranial nerve deficit or sensory deficit. He exhibits normal muscle tone. Coordination normal.  Skin: Skin is warm and dry.  Psychiatric: He has a normal mood and affect.  Nursing note and vitals reviewed.  ED Treatments / Results  Labs (all labs ordered are listed, but only abnormal results are displayed) Labs Reviewed - No data to display  EKG  EKG Interpretation  Date/Time:  Saturday July 07 2017 12:27:06 EDT Ventricular Rate:  105 PR Interval:  126 QRS Duration: 98 QT Interval:  342 QTC Calculation: 452 R Axis:   -61 Text Interpretation:  Sinus tachycardia Left axis deviation Abnormal ECG When compared with ECG of 07/02/2017, No significant change was found Confirmed by Dione Booze ([id number]) on 07/07/2017 2:12:47 PM      Radiology No results found.  Procedures Procedures (including critical care time)  Medications Ordered in ED Medications  sodium chloride 0.9 % bolus 1,000 mL (not administered)  metoCLOPramide  (REGLAN) injection 10 mg (not administered)  diphenhydrAMINE (BENADRYL) injection 25 mg (not administered)   Initial Impression / Assessment and Plan / ED Course  I have reviewed the triage vital signs and the nursing notes.  Pertinent labs & imaging results that were available during my care of the patient were reviewed by me and considered in my medical decision making (see chart for details).  Delawrence Fridman is a 51 y.o. male with significant PMHx of HTN who presented with multiple complaints of headache, dizziness, lightheadedness, anxiety and hypertension.     Patient HDS on my assessment. Normal neurologic exam.   No history of trauma so doubt epidural or subdural hematoma, intracranial hemorrhage, concussion, temporal arteritis, trigeminal neuralgia, eye pathology or other emergent pathology as this is an atypical history and physical, low risk, and primary diagnosis is much more likely,.  Neurological evaluation is unremarkable including normal cerebellar exam without dysmetria, dysdiadochokinesia or abnormal gait.   Normal orthostatic vital signs.  IV medications given for pain relief (Benadryl 25 mg, Reglan 10 mg). IV fluid bolus given. Headache and lightheadedness improved with medications. Given 1mg   IV ativan for anxiety with marked improvement of symptoms.   Discussed likely etiology with patient. Discussed return precautions. Recommended follow-up with PCP and/or neurologist if headaches continue to recur.   Dc home. Return precautions given. Follow up with primary care physician. Patient in agreement with plan  Final Clinical Impressions(s) / ED Diagnoses   Final diagnoses:  Lightheadedness  Anxiety    New Prescriptions New Prescriptions   No medications on file     Lamont SnowballGarza, Anna, MD 07/08/17 [id number]    Blane OharaZavitz, Joshua, MD 07/08/17 1701

## 2017-07-08 ENCOUNTER — Encounter (HOSPITAL_COMMUNITY): Payer: Self-pay

## 2017-07-08 ENCOUNTER — Emergency Department (HOSPITAL_COMMUNITY)
Admission: EM | Admit: 2017-07-08 | Discharge: 2017-07-08 | Discharge status: Against Medical Advice | Payer: Self-pay | Attending: Emergency Medicine | Admitting: Emergency Medicine

## 2017-07-08 ENCOUNTER — Ambulatory Visit (HOSPITAL_COMMUNITY)
Admission: EM | Admit: 2017-07-08 | Discharge: 2017-07-08 | Disposition: A | Discharge status: Home / Self Care | Payer: Self-pay

## 2017-07-08 ENCOUNTER — Emergency Department (HOSPITAL_COMMUNITY)
Admission: EM | Admit: 2017-07-08 | Discharge: 2017-07-08 | Disposition: A | Discharge status: Home / Self Care | Payer: Self-pay | Attending: Emergency Medicine | Admitting: Emergency Medicine

## 2017-07-08 ENCOUNTER — Encounter (HOSPITAL_COMMUNITY): Payer: Self-pay | Admitting: Emergency Medicine

## 2017-07-08 DIAGNOSIS — R42 Dizziness and giddiness: Secondary | ICD-10-CM | POA: Insufficient documentation

## 2017-07-08 DIAGNOSIS — E119 Type 2 diabetes mellitus without complications: Secondary | ICD-10-CM | POA: Insufficient documentation

## 2017-07-08 DIAGNOSIS — Z5321 Procedure and treatment not carried out due to patient leaving prior to being seen by health care provider: Secondary | ICD-10-CM | POA: Insufficient documentation

## 2017-07-08 DIAGNOSIS — I10 Essential (primary) hypertension: Secondary | ICD-10-CM | POA: Insufficient documentation

## 2017-07-08 DIAGNOSIS — M542 Cervicalgia: Secondary | ICD-10-CM | POA: Insufficient documentation

## 2017-07-08 DIAGNOSIS — Z79899 Other long term (current) drug therapy: Secondary | ICD-10-CM | POA: Insufficient documentation

## 2017-07-08 DIAGNOSIS — M549 Dorsalgia, unspecified: Secondary | ICD-10-CM | POA: Insufficient documentation

## 2017-07-08 LAB — CBC
HCT: 43.9 % (ref 39.0–52.0)
Hemoglobin: 14.3 g/dL (ref 13.0–17.0)
MCH: 27.3 pg (ref 26.0–34.0)
MCHC: 32.6 g/dL (ref 30.0–36.0)
MCV: 83.8 fL (ref 78.0–100.0)
Platelets: 273 10*3/uL (ref 150–400)
RBC: 5.24 MIL/uL (ref 4.22–5.81)
RDW: 13.3 % (ref 11.5–15.5)
WBC: 7.9 10*3/uL (ref 4.0–10.5)

## 2017-07-08 LAB — BASIC METABOLIC PANEL
Anion gap: 10 (ref 5–15)
BUN: 8 mg/dL (ref 6–20)
CO2: 25 mmol/L (ref 22–32)
Calcium: 9.1 mg/dL (ref 8.9–10.3)
Chloride: 99 mmol/L — ABNORMAL LOW (ref 101–111)
Creatinine, Ser: 0.79 mg/dL (ref 0.61–1.24)
GFR calc Af Amer: >60 mL/min (ref >60)
GFR calc non Af Amer: >60 mL/min (ref >60)
Glucose, Bld: 90 mg/dL (ref 65–99)
Potassium: 3.5 mmol/L (ref 3.5–5.1)
Sodium: 134 mmol/L — ABNORMAL LOW (ref 135–145)

## 2017-07-08 LAB — I-STAT TROPONIN, ED: Troponin i, poc: 0 ng/mL (ref 0.00–0.08)

## 2017-07-08 NOTE — ED Provider Notes (Signed)
[facility] EMERGENCY DEPARTMENT Provider Note   CSN: [id number] Arrival date & time: 07/08/17  1533     History   Chief Complaint No chief complaint on file.   HPI Wesley [name] is a 51 y.o. male.  HPI  51 year old man history of chronic neck pain, diabetes, anxiety, alcohol abuse, hypertension presents today complaining of feeling lightheaded.  He states that he felt okay this morning when he woke up.  He took his amlodipine and buspirone.  The buspirone is new with this just being the fourth day.  He states that his blood pressure was then elevated.  He went to the drugstore to check this.  He had no syncope.  He is complaining of some spasm in his upper back that then caused him to have some neck pain.  He took his Xanax with little relief.  He denies any lateralized deficit, dyspnea, or chest pain.  He has been eating and drinking as usual. Patient's care plan reviewed.  Past Medical History:  Diagnosis Date  . Alcohol abuse   . Anxiety   . Anxiety   . Chronic neck pain   . Cocaine abuse (HCC)   . Diabetes mellitus without complication (HCC)   . Hypertension     Patient Active Problem List   Diagnosis Date Noted  . Legally blind in right eye, as defined in [place] 05/02/2017  . Prediabetes 04/23/2017  . Muscle spasm 04/23/2017  . Essential hypertension 04/16/2017  . H/O medication noncompliance 04/02/2017  . Vitamin D deficiency 04/02/2017  . DJD (degenerative joint disease) of cervical spine 03/16/2017  . 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 Dizziness 01/23/2017  . Anxiety 11/21/2016    Past Surgical History:  Procedure Laterality Date  . EYE SURGERY Right ~ 1980   "hit my eye"       Home Medications    Prior to Admission medications   Medication Sig Start Date End Date Taking? Authorizing Provider  amLODipine (NORVASC) 5 MG tablet Take 1.5 tablets (7.5 mg total) by mouth daily. 07/03/17   Marcine Matar, MD  busPIRone (BUSPAR) 7.5 MG tablet  Take 1 tablet (7.5 mg total) by mouth 2 (two) times daily. 06/18/17   Marcine Matar, MD  cyclobenzaprine (FLEXERIL) 5 MG tablet Take 1 tablet (5 mg total) by mouth at bedtime as needed for muscle spasms. 06/22/17   Patel, Donika K, DO  LORazepam (ATIVAN) 0.5 MG tablet Take 1 tablet (0.5 mg total) by mouth 2 (two) times daily. for anxiety 07/03/17   Marcine Matar, MD  meclizine (ANTIVERT) 25 MG tablet Take 1 tablet (25 mg total) by mouth 3 (three) times daily as needed for dizziness. 05/27/17   Jacalyn Lefevre, MD  mirtazapine (REMERON) 15 MG tablet Take 0.5 tablets (7.5 mg total) by mouth at bedtime. 07/03/17   Marcine Matar, MD    Family History Family History  Problem Relation Age of Onset  . Hypertension Mother   . Hypertension Father     Social History Social History  Substance Use Topics  . Smoking status: Never Smoker  . Smokeless tobacco: Never Used  . Alcohol use No     Comment: former     Allergies   Motrin [ibuprofen]; Other; and Zofran [ondansetron hcl]   Review of Systems Review of Systems   Physical Exam Updated Vital Signs BP (!) 148/90 (BP Location: Right Arm)   Pulse (!) 102   Temp 98.7 F (37.1 C) (Oral)   Resp 16  SpO2 98%   Physical Exam  Constitutional: He is oriented to person, place, and time. He appears well-developed.  HENT:  Head: Normocephalic and atraumatic.  Right Ear: External ear normal.  Left Ear: External ear normal.  Nose: Nose normal.  Eyes: EOM are normal.  Neck: Normal range of motion. Neck supple. No tracheal deviation present.  Cardiovascular: Normal rate, regular rhythm and normal heart sounds.   Pulmonary/Chest: Effort normal and breath sounds normal.  Abdominal: Soft. Bowel sounds are normal.  Musculoskeletal: Normal range of motion.  Neurological: He is alert and oriented to person, place, and time.  Skin: Skin is warm and dry.  Psychiatric: He has a normal mood and affect. His behavior is normal.    Nursing note and vitals reviewed.    ED Treatments / Results  Labs (all labs ordered are listed, but only abnormal results are displayed) Labs Reviewed - No data to display  EKG  EKG Interpretation  Date/Time:    Ventricular Rate:    PR Interval:    QRS Duration:   QT Interval:    QTC Calculation:   R Axis:     Text Interpretation:        ED ECG REPORT   Date: 07/08/2017  Rate: 99  Rhythm: normal sinus rhythm  QRS Axis: normal  Intervals: normal  ST/T Wave abnormalities: nonspecific ST/T changes  Conduction Disutrbances:none  Narrative Interpretation:   Old EKG Reviewed: unchanged  I have personally reviewed the EKG tracing and agree with the computerized printout as noted.  Radiology No results found.  Procedures Procedures (including critical care time)  Medications Ordered in ED Medications - No data to display   Initial Impression / Assessment and Plan / ED Course  I have reviewed the triage vital signs and the nursing notes.  Pertinent labs & imaging results that were available during my care of the patient were reviewed by me and considered in my medical decision making (see chart for details).       Final Clinical Impressions(s) / ED Diagnoses   Final diagnoses:  Lightheaded  Hypertension, unspecified type    New Prescriptions New Prescriptions   No medications on file     Margarita Grizzleay, Danielle, MD 07/08/17 1651

## 2017-07-08 NOTE — ED Notes (Signed)
Called PT Three times, no answer. Placed PT back into lobby area.

## 2017-07-08 NOTE — ED Triage Notes (Addendum)
Patient here earlier today for dizziness and had EKG and labs and left prior to bein \\g  seen. Has now returned and wants evaluation for chronic dizziness. Alert and oriented, NAD

## 2017-07-08 NOTE — Discharge Instructions (Signed)
Please log your medications and blood pressure for the next two weeks three times per day and take to your doctor for recheck

## 2017-07-08 NOTE — ED Notes (Signed)
Called for room x 2.  

## 2017-07-08 NOTE — ED Triage Notes (Signed)
Pt c/o continues to have dizziness with shortness of breath. Pt seen here yesterday for same. Pt reports today with neck pain.

## 2017-07-09 ENCOUNTER — Emergency Department (HOSPITAL_COMMUNITY)
Admission: EM | Admit: 2017-07-09 | Discharge: 2017-07-09 | Disposition: A | Discharge status: Home / Self Care | Payer: Self-pay | Attending: Emergency Medicine | Admitting: Emergency Medicine

## 2017-07-09 ENCOUNTER — Encounter (HOSPITAL_COMMUNITY): Payer: Self-pay | Admitting: Emergency Medicine

## 2017-07-09 ENCOUNTER — Ambulatory Visit: Payer: Self-pay | Attending: Internal Medicine | Admitting: Internal Medicine

## 2017-07-09 ENCOUNTER — Encounter: Payer: Self-pay | Admitting: Internal Medicine

## 2017-07-09 VITALS — BP 134/92 | HR 96 | Temp 98.3°F | Resp 16 | Wt 191.8 lb

## 2017-07-09 DIAGNOSIS — Z79899 Other long term (current) drug therapy: Secondary | ICD-10-CM | POA: Insufficient documentation

## 2017-07-09 DIAGNOSIS — F419 Anxiety disorder, unspecified: Secondary | ICD-10-CM

## 2017-07-09 DIAGNOSIS — R079 Chest pain, unspecified: Secondary | ICD-10-CM | POA: Insufficient documentation

## 2017-07-09 DIAGNOSIS — R42 Dizziness and giddiness: Secondary | ICD-10-CM

## 2017-07-09 DIAGNOSIS — R200 Anesthesia of skin: Secondary | ICD-10-CM | POA: Insufficient documentation

## 2017-07-09 DIAGNOSIS — E119 Type 2 diabetes mellitus without complications: Secondary | ICD-10-CM | POA: Insufficient documentation

## 2017-07-09 DIAGNOSIS — R7303 Prediabetes: Secondary | ICD-10-CM

## 2017-07-09 DIAGNOSIS — H5461 Unqualified visual loss, right eye, normal vision left eye: Secondary | ICD-10-CM | POA: Insufficient documentation

## 2017-07-09 DIAGNOSIS — I10 Essential (primary) hypertension: Secondary | ICD-10-CM | POA: Insufficient documentation

## 2017-07-09 LAB — GLUCOSE, POCT (MANUAL RESULT ENTRY): POC Glucose: 106 mg/dl — AB (ref 70–99)

## 2017-07-09 MED ORDER — LORAZEPAM 1 MG PO TABS
1.0000 mg | ORAL_TABLET | Freq: Once | ORAL | Status: AC
  Administered 2017-07-09: 1 mg via ORAL
  Filled 2017-07-09: qty 1

## 2017-07-09 MED ORDER — MECLIZINE HCL 25 MG PO TABS: 25.0000 mg | ORAL_TABLET | Freq: Every day | ORAL | 1 refills | Status: DC | PRN

## 2017-07-09 MED ORDER — LORAZEPAM 0.5 MG PO TABS: 0.5000 mg | ORAL_TABLET | Freq: Two times a day (BID) | ORAL | 0 refills | Status: DC

## 2017-07-09 NOTE — Progress Notes (Signed)
Patient ID: Wesley [name], male    DOB: Sep 27, 1965  MRN: [id number]  CC:  [email or web address]   Subjective:  Wesley LitterJose Pasillas is a 51 y.o. male who presents for UC visit. His concerns today include:   Patient had appointment tomorrow but showed up today as a walk-in. -Needing refill on lorazepam. -Since last visit he continues to take boost bar once a day instead of twice a day. Reports dizziness with the medication -Reportedly has appointment later this week [facility] [place] [facility] [place] -Seen in the ER yesterday with anxiety symptoms and dizziness. I asked him about missing his neurology appointment last week after he left our office. Patient states he went to the urgent care instead because he felt he was having a heart attack. He apparently went by neurology office earlier today and his appointment was rescheduled for November 8. He also gave them the note that I gave him about evaluation for his dizziness   Patient Active Problem List   Diagnosis Date Noted  . 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 Prediabetes 04/23/2017  . Muscle spasm 04/23/2017  . Essential hypertension 04/16/2017  . H/O medication noncompliance 04/02/2017  . Vitamin D deficiency 04/02/2017  . DJD (degenerative joint disease) of cervical spine 03/16/2017  . 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 Dizziness 01/23/2017  . Anxiety 11/21/2016     Current Outpatient Prescriptions on File Prior to Visit  Medication Sig Dispense Refill  . amLODipine (NORVASC) 5 MG tablet Take 1.5 tablets (7.5 mg total) by mouth daily. 45 tablet 2  . busPIRone (BUSPAR) 7.5 MG tablet Take 1 tablet (7.5 mg total) by mouth 2 (two) times daily. 60 tablet 1  . cyclobenzaprine (FLEXERIL) 5 MG tablet Take 1 tablet (5 mg total) by mouth at bedtime as needed for muscle spasms. 30 tablet 3  . mirtazapine (REMERON) 15 MG tablet Take 0.5 tablets (7.5 mg total) by mouth at  bedtime. 30 tablet 1   No current facility-administered medications on file prior to visit.     Allergies  Allergen Reactions  . Motrin [Ibuprofen] Other (See Comments)    Makes his throat get "very dry" after taking it  . Other Other (See Comments)    Steroids: Patient received a steroid shot from [facility] (336) [phone] and it resulted in his muscles twitching all over his body  . Zofran [Ondansetron Hcl] Other (See Comments)    Causes sweating and body "feels badly"     ROS: Review of Systems He requested that I look in his left ear as he feels he has something in the ER  PHYSICAL EXAM: BP (!) 134/92   Pulse 96   Temp 98.3 F (36.8 C) (Oral)   Resp 16   Wt 191 lb 12.8 oz (87 kg)   SpO2 98%   BMI 30.96 kg/m   Physical Exam  General appearance - alert, well appearing, and in no distress Mental status - patient is alert and oriented Ears - bilateral TM's and external ear canals normal Chest - clear to auscultation, no wheezes, rales or rhonchi, symmetric air entry Heart - normal rate, regular rhythm, normal S1, S2, no murmurs, rubs, clicks or gallops  Results for orders placed or performed in visit on 07/09/17  POCT glucose (manual entry)  Result Value Ref Range   POC Glucose 106 (A) 70 - 99 mg/dl     ASSESSMENT AND PLAN: 1. Anxiety  disorder, unspecified type -I reminded patient that we will continue with the plan to wean him off lorazepam whether he decides to take BuSpar are not. His complaint of dizziness is chronic and preceded him being on BuSpar. -Continue lorazepam 0.5 mg twice a day for another week then we will go to half a tablet. He will follow-up in one week.. Advised to keep his appointment with behavioral health with [facility] [place] or in [place] do not think he needs to see both. -Also encourage him to keep appointment with neurologist for evaluation of chronic dizziness - meclizine (ANTIVERT) 25 MG tablet; Take 1  tablet (25 mg total) by mouth daily as needed for dizziness.  Dispense: 30 tablet; Refill: 1 - LORazepam (ATIVAN) 0.5 MG tablet; Take 1 tablet (0.5 mg total) by mouth 2 (two) times daily. for anxiety  Dispense: 14 tablet; Refill: 0  2. Dizzinesses -Encourage him to keep appointment with neurologist for evaluation of chronic dizziness  3. Prediabetes - POCT glucose (manual entry)  Patient was given the opportunity to ask questions.  Patient verbalized understanding of the plan and was able to repeat key elements of the plan.   Orders Placed This Encounter  Procedures  . POCT glucose (manual entry)     Requested Prescriptions   Signed Prescriptions Disp Refills  . meclizine (ANTIVERT) 25 MG tablet 30 tablet 1    Sig: Take 1 tablet (25 mg total) by mouth daily as needed for dizziness.  Marland Kitchen LORazepam (ATIVAN) 0.5 MG tablet 14 tablet 0    Sig: Take 1 tablet (0.5 mg total) by mouth 2 (two) times daily. for anxiety    Future Appointments Date Time Provider Department Center  07/12/2017 2:30 PM [facility] NURSE [facility] None  07/19/2017 2:15 PM Glendale Chard, DO [facility] None    Jonah Blue, MD, Jerrel Ivory

## 2017-07-09 NOTE — Discharge Instructions (Signed)
Please follow up with family doctor regarding your symptoms. Your evaluation today shows no concern for heart attack, stroke, any other life threatening conditions.

## 2017-07-09 NOTE — ED Provider Notes (Signed)
[facility] EMERGENCY DEPARTMENT Provider Note   CSN: [id number] Arrival date & time: 07/09/17  0531     History   Chief Complaint Chief Complaint  Patient presents with  . Dizziness    HPI Wesley [name] is a 51 y.o. male.  HPI Wesley [name] is a 51 y.o. male with history of alcohol abuse, cocaine abuse, diabetes, hypertension, chronic neck pain, chronic anxiety, presents to emergency department complaining of chest pain and dizziness.  Patient states that he woke up around 3 AM, could not go back to sleep, and started feeling like his heart rate was racing and he had some chest pressure.  He states he then felt dizzy.  He reports some tingling sensation in the right face.  Patient with chronic anxiety and dizziness.  He has been seen by his primary care doctor, here in emergency department, and by neurologist for this dizziness.  It was thought to be due to his anxiety.  He has had extensive workup which included MRIs, CT angios of his neck, cardiac evaluation with no positive findings.  He takes Ativan for his anxiety his last dose was at 3 AM when he woke up.  Patient was seen 3 times for the same yesterday, once left without being seen but was evaluated by physician twice.  Patient has had greater than 80 visits to emergency department for similar symptoms in the last 6 months.  Past Medical History:  Diagnosis Date  . Alcohol abuse   . Anxiety   . Anxiety   . Chronic neck pain   . Cocaine abuse (HCC)   . Diabetes mellitus without complication (HCC)   . Hypertension     Patient Active Problem List   Diagnosis Date Noted  . Legally blind in right eye, as defined in [place] 05/02/2017  . Prediabetes 04/23/2017  . Muscle spasm 04/23/2017  . Essential hypertension 04/16/2017  . H/O medication noncompliance 04/02/2017  . Vitamin D deficiency 04/02/2017  . DJD (degenerative joint disease) of cervical spine 03/16/2017  . 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 Dizziness  01/23/2017  . Anxiety 11/21/2016    Past Surgical History:  Procedure Laterality Date  . EYE SURGERY Right ~ 1980   "hit my eye"       Home Medications    Prior to Admission medications   Medication Sig Start Date End Date Taking? Authorizing Provider  amLODipine (NORVASC) 5 MG tablet Take 1.5 tablets (7.5 mg total) by mouth daily. 07/03/17   Marcine Matar, MD  busPIRone (BUSPAR) 7.5 MG tablet Take 1 tablet (7.5 mg total) by mouth 2 (two) times daily. 06/18/17   Marcine Matar, MD  cyclobenzaprine (FLEXERIL) 5 MG tablet Take 1 tablet (5 mg total) by mouth at bedtime as needed for muscle spasms. 06/22/17   Patel, Donika K, DO  LORazepam (ATIVAN) 0.5 MG tablet Take 1 tablet (0.5 mg total) by mouth 2 (two) times daily. for anxiety 07/03/17   Marcine Matar, MD  meclizine (ANTIVERT) 25 MG tablet Take 1 tablet (25 mg total) by mouth 3 (three) times daily as needed for dizziness. 05/27/17   Jacalyn Lefevre, MD  mirtazapine (REMERON) 15 MG tablet Take 0.5 tablets (7.5 mg total) by mouth at bedtime. 07/03/17   Marcine Matar, MD    Family History Family History  Problem Relation Age of Onset  . Hypertension Mother   . Hypertension Father     Social History Social History  Substance Use Topics  . Smoking status:  Never Smoker  . Smokeless tobacco: Never Used  . Alcohol use No     Comment: former     Allergies   Motrin [ibuprofen]; Other; and Zofran [ondansetron hcl]   Review of Systems Review of Systems  Constitutional: Negative for chills and fever.  Respiratory: Positive for chest tightness. Negative for cough and shortness of breath.   Cardiovascular: Positive for chest pain. Negative for palpitations and leg swelling.  Gastrointestinal: Negative for abdominal distention, abdominal pain, diarrhea, nausea and vomiting.  Genitourinary: Negative for dysuria, frequency, hematuria and urgency.  Musculoskeletal: Negative for arthralgias, myalgias, neck pain and  neck stiffness.  Skin: Negative for rash.  Allergic/Immunologic: Negative for immunocompromised state.  Neurological: Positive for dizziness, light-headedness and numbness. Negative for weakness and headaches.  All other systems reviewed and are negative.    Physical Exam Updated Vital Signs BP (!) 146/103   Pulse (!) 104   Temp 98.3 F (36.8 C) (Oral)   Resp (!) 21   Ht 5\' 6"  (1.676 m)   Wt 86.2 kg (190 lb)   SpO2 99%   BMI 30.67 kg/m   Physical Exam  Constitutional: He is oriented to person, place, and time. He appears well-developed and well-nourished. No distress.  HENT:  Head: Normocephalic and atraumatic.  Right Ear: External ear normal.  Left Ear: External ear normal.  Mouth/Throat: Oropharynx is clear and moist.  Eyes: Pupils are equal, round, and reactive to light. Conjunctivae and EOM are normal.  Neck: Neck supple.  Cardiovascular: Normal rate, regular rhythm and normal heart sounds.   Pulmonary/Chest: Effort normal. No respiratory distress. He has no wheezes. He has no rales.  Abdominal: Soft. Bowel sounds are normal. He exhibits no distension. There is no tenderness. There is no rebound.  Musculoskeletal: He exhibits no edema.  Neurological: He is alert and oriented to person, place, and time. He displays normal reflexes. No cranial nerve deficit. Coordination normal.  5/5 and equal upper and lower extremity strength bilaterally. Equal grip strength bilaterally. Normal finger to nose and heel to shin. No pronator drift. Patellar reflexes 2+   Skin: Skin is warm and dry.  Nursing note and vitals reviewed.    ED Treatments / Results  Labs (all labs ordered are listed, but only abnormal results are displayed) Labs Reviewed - No data to display  EKG  EKG Interpretation  Date/Time:  Monday July 09 2017 06:07:59 EDT Ventricular Rate:  86 PR Interval:    QRS Duration: 96 QT Interval:  383 QTC Calculation: 459 R Axis:   -48 Text Interpretation:  Sinus  rhythm Left anterior fascicular block Abnormal R-wave progression, late transition No significant change since last tracing 08 Jul 2017 Confirmed by Devoria AlbeKnapp, Iva ([id number]) on 07/09/2017 6:26:25 AM Also confirmed by Devoria AlbeKnapp, Iva ([id number]), editor Elita QuickWatlington, Beverly ([id number])  on 07/09/2017 6:53:16 AM       Radiology No results found.  Procedures Procedures (including critical care time)  Medications Ordered in ED Medications  LORazepam (ATIVAN) tablet 1 mg (1 mg Oral Given 07/09/17 0705)     Initial Impression / Assessment and Plan / ED Course  I have reviewed the triage vital signs and the nursing notes.  Pertinent labs & imaging results that were available during my care of the patient were reviewed by me and considered in my medical decision making (see chart for details).     Patient with chronic dizziness, chronic anxiety.  His evaluation is unremarkable including neurological exam.  Specifically the right-sided facial numbness that  is reported, has normal sensation and no cranial nerve deficits on exam.  I will give him a dose of Ativan here.  I will discharge him home with close outpatient follow-up.  Like mentioned earlier, patient has had 81 visits to emergency department for the same in the last 6 months, and was seen twice yesterday for the same symptoms.  He had basic labs and troponin EKG done which was unremarkable.  EKG was repeated today and is unchanged.  Stable for discharge home with outpatient follow-up.  Vitals:   07/09/17 0540 07/09/17 0541 07/09/17 0615 07/09/17 0645  BP: (!) 134/96  (!) 124/99 (!) 146/103  Pulse: 89  88 (!) 104  Resp: 18  17 (!) 21  Temp: 98.3 F (36.8 C)     TempSrc: Oral     SpO2: 100%  100% 99%  Weight:  86.2 kg (190 lb)    Height:  5\' 6"  (1.676 m)       Final Clinical Impressions(s) / ED Diagnoses   Final diagnoses:  Dizziness  Anxiety    New Prescriptions New Prescriptions   No medications on file     Jaynie Crumble,  PA-C 07/09/17 1610    Devoria Albe, MD 07/09/17 [phone]

## 2017-07-09 NOTE — ED Triage Notes (Signed)
Pt c/o dizziness for the past 3 days, numbness on his face and neck pain, pt last seen for same complain on the ED yesterday, pt AO x 4 on triage no neuro deficit noticed.

## 2017-07-10 ENCOUNTER — Emergency Department (HOSPITAL_COMMUNITY)
Admission: EM | Admit: 2017-07-10 | Discharge: 2017-07-10 | Disposition: A | Discharge status: Home / Self Care | Payer: Self-pay | Attending: Emergency Medicine | Admitting: Emergency Medicine

## 2017-07-10 ENCOUNTER — Telehealth: Payer: Self-pay | Admitting: *Deleted

## 2017-07-10 ENCOUNTER — Encounter (HOSPITAL_COMMUNITY): Payer: Self-pay | Admitting: Emergency Medicine

## 2017-07-10 DIAGNOSIS — I10 Essential (primary) hypertension: Secondary | ICD-10-CM | POA: Insufficient documentation

## 2017-07-10 DIAGNOSIS — F419 Anxiety disorder, unspecified: Secondary | ICD-10-CM | POA: Insufficient documentation

## 2017-07-10 DIAGNOSIS — Z5321 Procedure and treatment not carried out due to patient leaving prior to being seen by health care provider: Secondary | ICD-10-CM | POA: Insufficient documentation

## 2017-07-10 LAB — CBC
HCT: 42.8 % (ref 39.0–52.0)
Hemoglobin: 13.9 g/dL (ref 13.0–17.0)
MCH: 26.8 pg (ref 26.0–34.0)
MCHC: 32.5 g/dL (ref 30.0–36.0)
MCV: 82.5 fL (ref 78.0–100.0)
Platelets: 270 10*3/uL (ref 150–400)
RBC: 5.19 MIL/uL (ref 4.22–5.81)
RDW: 13.4 % (ref 11.5–15.5)
WBC: 7.1 10*3/uL (ref 4.0–10.5)

## 2017-07-10 LAB — BASIC METABOLIC PANEL
Anion gap: 11 (ref 5–15)
BUN: 10 mg/dL (ref 6–20)
CO2: 24 mmol/L (ref 22–32)
Calcium: 9.5 mg/dL (ref 8.9–10.3)
Chloride: 100 mmol/L — ABNORMAL LOW (ref 101–111)
Creatinine, Ser: 0.71 mg/dL (ref 0.61–1.24)
GFR calc Af Amer: >60 mL/min (ref >60)
GFR calc non Af Amer: >60 mL/min (ref >60)
Glucose, Bld: 122 mg/dL — ABNORMAL HIGH (ref 65–99)
Potassium: 3.9 mmol/L (ref 3.5–5.1)
Sodium: 135 mmol/L (ref 135–145)

## 2017-07-10 LAB — I-STAT TROPONIN, ED: Troponin i, poc: 0 ng/mL (ref 0.00–0.08)

## 2017-07-10 NOTE — Telephone Encounter (Signed)
[facility] that his blood pressure is high. Per pt,  he took medications today: Amlodipine and Metoprolol. His BP: 144/106 P: 114 Reg. SpO2:97%.

## 2017-07-10 NOTE — ED Notes (Signed)
Patient called from waiting room x3 with no answer

## 2017-07-10 NOTE — ED Triage Notes (Signed)
Fells like his pressure is high and it makes his head and chest feel funny

## 2017-07-11 ENCOUNTER — Encounter (HOSPITAL_COMMUNITY): Payer: Self-pay

## 2017-07-11 ENCOUNTER — Telehealth: Payer: Self-pay | Admitting: Internal Medicine

## 2017-07-11 ENCOUNTER — Emergency Department (HOSPITAL_COMMUNITY)
Admission: EM | Admit: 2017-07-11 | Discharge: 2017-07-11 | Disposition: A | Discharge status: Home / Self Care | Payer: Self-pay | Attending: Emergency Medicine | Admitting: Emergency Medicine

## 2017-07-11 DIAGNOSIS — R110 Nausea: Secondary | ICD-10-CM | POA: Insufficient documentation

## 2017-07-11 DIAGNOSIS — R42 Dizziness and giddiness: Secondary | ICD-10-CM

## 2017-07-11 DIAGNOSIS — I10 Essential (primary) hypertension: Secondary | ICD-10-CM | POA: Insufficient documentation

## 2017-07-11 DIAGNOSIS — F1410 Cocaine abuse, uncomplicated: Secondary | ICD-10-CM | POA: Insufficient documentation

## 2017-07-11 DIAGNOSIS — R0602 Shortness of breath: Secondary | ICD-10-CM | POA: Insufficient documentation

## 2017-07-11 DIAGNOSIS — E119 Type 2 diabetes mellitus without complications: Secondary | ICD-10-CM | POA: Insufficient documentation

## 2017-07-11 DIAGNOSIS — F419 Anxiety disorder, unspecified: Secondary | ICD-10-CM | POA: Insufficient documentation

## 2017-07-11 MED ORDER — METOCLOPRAMIDE HCL 10 MG PO TABS
10.0000 mg | ORAL_TABLET | Freq: Once | ORAL | Status: AC
  Administered 2017-07-11: 10 mg via ORAL
  Filled 2017-07-11: qty 1

## 2017-07-11 NOTE — Telephone Encounter (Signed)
Pt came to the office to request a change of prescription since the amLODipine   increase his bp, please advice

## 2017-07-11 NOTE — Discharge Instructions (Signed)
Please follow up with psychiatry today

## 2017-07-11 NOTE — Telephone Encounter (Signed)
Will forward to pcp

## 2017-07-11 NOTE — Telephone Encounter (Signed)
Pt is aware but pt kept saying that the Norvasc keeps making his bp go higher

## 2017-07-11 NOTE — ED Triage Notes (Signed)
Patient here hyperventilating and complaining of ongoing dizziness daily. States that he feels SOB. Appears anxious and hx of anxiety. Here yesterday for same

## 2017-07-11 NOTE — ED Provider Notes (Signed)
[facility] EMERGENCY DEPARTMENT Provider Note   CSN: [id number] Arrival date & time: 07/11/17  0708     History   Chief Complaint No chief complaint on file.   HPI Wesley [name] is a 51 y.o. male who presents with multiple complaints. PMH significant for multiple ED visits (now 15), chronic dizziness, HTN. He states that he is having the same symptoms as usual but they are worse. He reports worsening symptoms after taking his Lorazepam and Amlodipine this morning. He reports dizziness, feeling that his "head is hot", heaviness of the arms and legs, chest pain, SOB, nausea, vomiting. He denies anxiety. He denies fever, URI symptoms, abdominal pain, diarrhea. He tried to come yesterday as well but LWBS due to long wait. He saw his PCP two days ago who encouraged to keep his appointment with neurology and psych.  He has appointment with psychiatry at 2 PM today  HPI  Past Medical History:  Diagnosis Date  . Alcohol abuse   . Anxiety   . Anxiety   . Chronic neck pain   . Cocaine abuse (HCC)   . Diabetes mellitus without complication (HCC)   . Hypertension     Patient Active Problem List   Diagnosis Date Noted  . Legally blind in right eye, as defined in [place] 05/02/2017  . Prediabetes 04/23/2017  . Muscle spasm 04/23/2017  . Essential hypertension 04/16/2017  . H/O medication noncompliance 04/02/2017  . Vitamin D deficiency 04/02/2017  . DJD (degenerative joint disease) of cervical spine 03/16/2017  . 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 Dizziness 01/23/2017  . Anxiety 11/21/2016    Past Surgical History:  Procedure Laterality Date  . EYE SURGERY Right ~ 1980   "hit my eye"       Home Medications    Prior to Admission medications   Medication Sig Start Date End Date Taking? Authorizing Provider  amLODipine (NORVASC) 5 MG tablet Take 1.5 tablets (7.5 mg total) by mouth daily. 07/03/17   Marcine Matar, MD  busPIRone (BUSPAR) 7.5 MG tablet Take 1 tablet  (7.5 mg total) by mouth 2 (two) times daily. 06/18/17   Marcine Matar, MD  cyclobenzaprine (FLEXERIL) 5 MG tablet Take 1 tablet (5 mg total) by mouth at bedtime as needed for muscle spasms. 06/22/17   Patel, Donika K, DO  LORazepam (ATIVAN) 0.5 MG tablet Take 1 tablet (0.5 mg total) by mouth 2 (two) times daily. for anxiety 07/09/17   Marcine Matar, MD  meclizine (ANTIVERT) 25 MG tablet Take 1 tablet (25 mg total) by mouth daily as needed for dizziness. 07/09/17   Marcine Matar, MD  mirtazapine (REMERON) 15 MG tablet Take 0.5 tablets (7.5 mg total) by mouth at bedtime. 07/03/17   Marcine Matar, MD    Family History Family History  Problem Relation Age of Onset  . Hypertension Mother   . Hypertension Father     Social History Social History  Substance Use Topics  . Smoking status: Never Smoker  . Smokeless tobacco: Never Used  . Alcohol use No     Comment: former     Allergies   Motrin [ibuprofen]; Other; and Zofran [ondansetron hcl]   Review of Systems Review of Systems  Constitutional: Negative for fever.  HENT: Negative for congestion, rhinorrhea and sore throat.   Respiratory: Positive for shortness of breath. Negative for cough.   Cardiovascular: Positive for chest pain.  Gastrointestinal: Positive for nausea and vomiting. Negative for abdominal pain and diarrhea.  Genitourinary: Negative for difficulty urinating.  Musculoskeletal: Positive for neck pain (chronic).  Neurological: Positive for dizziness (chronic) and headaches. Negative for syncope.  Psychiatric/Behavioral: The patient is nervous/anxious.      Physical Exam Updated Vital Signs BP (!) 152/114 (BP Location: Left Arm)   Pulse (!) 125   Temp 97.8 F (36.6 C) (Oral)   SpO2 100%   Physical Exam  Constitutional: He is oriented to person, place, and time. He appears well-developed and well-nourished. No distress.  Calm, cooperative, mildly anxious  HENT:  Head: Normocephalic and  atraumatic.  Eyes: Pupils are equal, round, and reactive to light. Conjunctivae are normal. Right eye exhibits no discharge. Left eye exhibits no discharge. No scleral icterus.  Neck: Normal range of motion.  Cardiovascular: Regular rhythm.  Tachycardia present.  Exam reveals no gallop and no friction rub.   No murmur heard. Pulmonary/Chest: Effort normal and breath sounds normal. No respiratory distress. He has no wheezes. He has no rales. He exhibits no tenderness.  Abdominal: Soft. Bowel sounds are normal. He exhibits no distension. There is no tenderness.  Neurological: He is alert and oriented to person, place, and time.  Lying on stretcher in NAD. GCS 15. Speaks in a clear voice. Cranial nerves II through XII grossly intact. 5/5 strength in all extremities. Sensation fully intact.  Bilateral finger-to-nose intact. Ambulatory    Skin: Skin is warm and dry.  Psychiatric: He has a normal mood and affect. His behavior is normal.  Nursing note and vitals reviewed.    ED Treatments / Results  Labs (all labs ordered are listed, but only abnormal results are displayed) Labs Reviewed - No data to display  EKG  EKG Interpretation None       Radiology No results found.  Procedures Procedures (including critical care time)  Medications Ordered in ED Medications  metoCLOPramide (REGLAN) tablet 10 mg (10 mg Oral Given 07/11/17 0931)     Initial Impression / Assessment and Plan / ED Course  I have reviewed the triage vital signs and the nursing notes.  Pertinent labs & imaging results that were available during my care of the patient were reviewed by me and considered in my medical decision making (see chart for details).  51 year old male presents with SOB, nausea, dizziness. BP is elevated and HR is 125 on arrival. After an hour and half of monitoring, vitals have normalized. EKG is sinus tachycardia. Lung exam is normal. He was given Reglan and tolerated this without vomiting.  He has appt with psych today at [facility]2PM which he was encouraged to keep.   Final Clinical Impressions(s) / ED Diagnoses   Final diagnoses:  SOB (shortness of breath)  Nausea  Dizziness    New Prescriptions New Prescriptions   No medications on file     Beryle QuantGekas, Kelly Marie, PA-C 07/11/17 [id number]    Azalia Bilisampos, Kevin, MD 07/11/17 [phone]

## 2017-07-16 ENCOUNTER — Telehealth: Payer: Self-pay | Admitting: Internal Medicine

## 2017-07-16 ENCOUNTER — Ambulatory Visit: Payer: Self-pay | Attending: Internal Medicine

## 2017-07-16 NOTE — Telephone Encounter (Signed)
Pt called to check on the request for a refill for his med, please follow up

## 2017-07-16 NOTE — Telephone Encounter (Signed)
Patient came into office requesting medication refill on LORazepam (ATIVAN) 0.5 MG tablet, pt is completely out of medication . Please f/up

## 2017-07-16 NOTE — Telephone Encounter (Signed)
Pt has 1 wk f/u appt tomorrow.

## 2017-07-17 ENCOUNTER — Ambulatory Visit: Payer: Self-pay | Admitting: Internal Medicine

## 2017-07-19 ENCOUNTER — Ambulatory Visit (INDEPENDENT_AMBULATORY_CARE_PROVIDER_SITE_OTHER): Payer: Self-pay | Admitting: Neurology

## 2017-07-19 DIAGNOSIS — R202 Paresthesia of skin: Secondary | ICD-10-CM

## 2017-07-19 DIAGNOSIS — M542 Cervicalgia: Secondary | ICD-10-CM

## 2017-07-19 NOTE — Progress Notes (Signed)
Patient arrived for EMG and elected not to proceed due to pain and intolerance of testing.   Wesley K. Allena KatzPatel, DO

## 2017-07-20 ENCOUNTER — Other Ambulatory Visit: Payer: Self-pay

## 2017-07-20 ENCOUNTER — Emergency Department (HOSPITAL_COMMUNITY)
Admission: EM | Admit: 2017-07-20 | Discharge: 2017-07-20 | Disposition: A | Discharge status: Home / Self Care | Payer: Self-pay | Attending: Emergency Medicine | Admitting: Emergency Medicine

## 2017-07-20 ENCOUNTER — Encounter (HOSPITAL_COMMUNITY): Payer: Self-pay | Admitting: Emergency Medicine

## 2017-07-20 DIAGNOSIS — R110 Nausea: Secondary | ICD-10-CM | POA: Insufficient documentation

## 2017-07-20 DIAGNOSIS — Z79899 Other long term (current) drug therapy: Secondary | ICD-10-CM | POA: Insufficient documentation

## 2017-07-20 DIAGNOSIS — F419 Anxiety disorder, unspecified: Secondary | ICD-10-CM | POA: Insufficient documentation

## 2017-07-20 DIAGNOSIS — I10 Essential (primary) hypertension: Secondary | ICD-10-CM | POA: Insufficient documentation

## 2017-07-20 DIAGNOSIS — E119 Type 2 diabetes mellitus without complications: Secondary | ICD-10-CM | POA: Insufficient documentation

## 2017-07-20 DIAGNOSIS — M79642 Pain in left hand: Secondary | ICD-10-CM | POA: Insufficient documentation

## 2017-07-20 MED ORDER — LORAZEPAM 1 MG PO TABS
1.0000 mg | ORAL_TABLET | Freq: Once | ORAL | Status: AC
  Administered 2017-07-20: 1 mg via ORAL
  Filled 2017-07-20: qty 1

## 2017-07-20 MED ORDER — ACETAMINOPHEN 325 MG PO TABS
650.0000 mg | ORAL_TABLET | Freq: Once | ORAL | Status: AC
  Administered 2017-07-20: 650 mg via ORAL
  Filled 2017-07-20: qty 2

## 2017-07-20 NOTE — ED Provider Notes (Signed)
[facility]GENCY [facility] Provider Note   CSN: [id number] Arrival date & time: 07/20/17  [id number]     History   Chief Complaint Chief Complaint  Patient presents with  . Hand Pain    HPI Wesley [name] is a 51 y.o. male with PMH/o Anxiety and HTN who presents with left hand pain that began last night.  Patient reports that the site that he started experiencing some pain to the left hand.  He reports that the pain radiates to the left wrist but does not radiate up the left upper extremity.  He is still able to move the hand without any difficulty.  He denies any preceding trauma, injury.  He has not taken any medications for the pain.  Patient also reports today when he woke up in the proximal exam, he was having some nausea.  He reports he has not been able to drink liquids but has not eaten anything.  He denies any vomiting.  Patient took 0.5 mg of Ativan with no improvement in symptoms, prompting ED visit.  Patient denies any fevers, chills, chest pain, difficulty breathing, numbness/weakness of his extremities, abdominal pain, back pain, neck pain, redness or swelling of the hand.  The history is provided by the patient.    Past Medical History:  Diagnosis Date  . Alcohol abuse   . Anxiety   . Anxiety   . Chronic neck pain   . Cocaine abuse (HCC)   . Diabetes mellitus without complication (HCC)   . Hypertension     Patient Active Problem List   Diagnosis Date Noted  . 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 Prediabetes 04/23/2017  . Muscle spasm 04/23/2017  . Essential hypertension 04/16/2017  . H/O medication noncompliance 04/02/2017  . Vitamin D deficiency 04/02/2017  . DJD (degenerative joint disease) of cervical spine 03/16/2017  . 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 Dizziness 01/23/2017  . Anxiety 11/21/2016    Past Surgical History:  Procedure Laterality Date  . EYE SURGERY Right ~ 1980   "hit my eye"       Home Medications     Prior to Admission medications   Medication Sig Start Date End Date Taking? Authorizing Provider  amLODipine (NORVASC) 5 MG tablet Take 1.5 tablets (7.5 mg total) by mouth daily. 07/03/17   Marcine MatarJohnson, Deborah B, MD  busPIRone (BUSPAR) 7.5 MG tablet Take 1 tablet (7.5 mg total) by mouth 2 (two) times daily. 06/18/17   Marcine MatarJohnson, Deborah B, MD  cyclobenzaprine (FLEXERIL) 5 MG tablet Take 1 tablet (5 mg total) by mouth at bedtime as needed for muscle spasms. 06/22/17   Patel, Donika K, DO  LORazepam (ATIVAN) 0.5 MG tablet Take 1 tablet (0.5 mg total) by mouth 2 (two) times daily. for anxiety 07/09/17   Marcine MatarJohnson, Deborah B, MD  meclizine (ANTIVERT) 25 MG tablet Take 1 tablet (25 mg total) by mouth daily as needed for dizziness. 07/09/17   Marcine MatarJohnson, Deborah B, MD  mirtazapine (REMERON) 15 MG tablet Take 0.5 tablets (7.5 mg total) by mouth at bedtime. 07/03/17   Marcine MatarJohnson, Deborah B, MD    Family History Family History  Problem Relation Age of Onset  . Hypertension Mother   . Hypertension Father     Social History Social History   Tobacco Use  . Smoking status: Never Smoker  . Smokeless tobacco: Never Used  Substance Use Topics  . Alcohol use: No    Comment: former  . Drug use: Yes  Comment: denies, reports past history      Allergies   Motrin [ibuprofen]; Other; and Zofran [ondansetron hcl]   Review of Systems Review of Systems  Constitutional: Negative for fever.  Respiratory: Negative for shortness of breath.   Cardiovascular: Negative for chest pain.  Gastrointestinal: Positive for nausea. Negative for vomiting.  Musculoskeletal: Negative for back pain and neck pain.       Hand pain  Skin: Negative for color change.  Neurological: Negative for weakness.     Physical Exam Updated Vital Signs BP (!) 132/97   Pulse (!) 107   Temp 98 F (36.7 C)   Resp 20   SpO2 96%   Physical Exam  Constitutional: He is oriented to person, place, and time. He appears well-developed  and well-nourished.  Appears anxious but no acute distress.  HENT:  Head: Normocephalic and atraumatic.  Mouth/Throat: Oropharynx is clear and moist and mucous membranes are normal.  Eyes: Conjunctivae, EOM and lids are normal. Pupils are equal, round, and reactive to light.  Neck: Full passive range of motion without pain.  Cardiovascular: Normal rate, regular rhythm, normal heart sounds and normal pulses. Exam reveals no gallop and no friction rub.  No murmur heard. Pulses:      Radial pulses are 2+ on the right side, and 2+ on the left side.  Pulmonary/Chest: Effort normal and breath sounds normal.  No evidence of respiratory distress. Able to speak in full sentences without difficulty.  Abdominal: Soft. Normal appearance. There is no tenderness. There is no rigidity and no guarding.  Musculoskeletal: Normal range of motion.  No tenderness palpation to the right wrist.  No deformity or crepitus noted.  No overlying warmth, erythema, ecchymosis.  Diffuse muscular tenderness over the entire hand with no focal point of tenderness.  No deformity or crepitus noted.  Patient is able to easily make a fist with the right hand without any difficulty.  Full range of motion of all 5 digits of left hand intact without any difficulty.  No abnormalities of the left elbow, left shoulder.  No abnormalities of the right upper extremity.  Neurological: He is alert and oriented to person, place, and time. GCS eye subscore is 4. GCS verbal subscore is 5. GCS motor subscore is 6.  Cranial nerves III-XII intact Follows commands, Moves all extremities  5/5 strength to BUE and BLE  Sensation intact throughout all major nerve distributions Normal finger to nose. No pronator drift. No gait abnormalities  No slurred speech. No facial droop.   Skin: Skin is warm and dry. Capillary refill takes less than 2 seconds.  LUE is not dusky in appearance or cool to touch.  Psychiatric: He has a normal mood and affect. His  speech is normal.  Nursing note and vitals reviewed.    ED Treatments / Results  Labs (all labs ordered are listed, but only abnormal results are displayed) Labs Reviewed - No data to display  EKG  EKG Interpretation  Date/Time:  Friday July 20 2017 11:09:46 EST Ventricular Rate:  102 PR Interval:    QRS Duration: 97 QT Interval:  356 QTC Calculation: 464 R Axis:   -80 Text Interpretation:  Sinus tachycardia Left anterior fascicular block Abnormal R-wave progression, late transition No significant change was found Confirmed by Azalia Bilis ([id number]) on 07/20/2017 11:13:24 AM       Radiology No results found.  Procedures Procedures (including critical care time)  Medications Ordered in ED Medications  LORazepam (ATIVAN) tablet 1 mg (  1 mg Oral Given 07/20/17 1120)  acetaminophen (TYLENOL) tablet 650 mg (650 mg Oral Given 07/20/17 1121)     Initial Impression / Assessment and Plan / ED Course  I have reviewed the triage vital signs and the nursing notes.  Pertinent labs & imaging results that were available during my care of the patient were reviewed by me and considered in my medical decision making (see chart for details).     51 year old male with past medical history of anxiety and hypertension who presents emergency department for 2 complaints.  Patient reports he started experiencing some left hand pain last night.  No preceding trauma, injury, fall.  Patient also reports this morning he started having some nausea.  Patient denies any fevers, chills, redness or swelling of the hand, chest pain, difficulty breathing. Patient is afebrile, non-toxic appearing, appears anxious.  Vital signs reviewed and stable.  Patient is slightly tachycardic but records reviewed show that this is consistent with patient's baseline.  Suspect that anxiety may be contributing to vitals.  Reassuring history/physical exam.  Consider muscle strain versus anxiety.  History/physical exam not  concerning for ACS etiology, CVA, septic arthritis, DVT of the upper extremity, cellulitis.  Do not suspect fracture dislocation given history/physical exam and lack of trauma or injury.  On physical exam, patient has no focal point of tenderness but has some diffuse muscular pain.  He has not taking any medications.  We will plan to give him analgesics here in the department.  We will plan to give patient some Ativan to help with both nausea and anxiety.  Will reassess.  EKG ordered at triage.  EKG reviewed.  Shows sinus tachycardia.  There is also mention of a left anterior fascicular block.  Records reviewed to this is consistent with previous.  Discussed patient with Dr. Criss AlvineGoldston.   Reevaluation.  Patient reports improvement of pain after Tylenol.  He is able to easily move the hand without any difficulty.  Patient also reports that the nausea has improved after the Ativan.  We will plan to p.o. challenge patient in the department.  Patient was able to tolerate p.o. without any difficulty.  He reports improvement in symptoms.  Vital signs are reassuring.  She is ambulating in the department without any difficulty.  Patient stable for discharge at this time.  Patient instructed to follow-up with his primary care doctor in the next 24-48 hours for further evaluation. Patient had ample opportunity for questions and discussion. All patient's questions were answered with full understanding. Strict return precautions discussed. Patient expresses understanding and agreement to plan.     Final Clinical Impressions(s) / ED Diagnoses   Final diagnoses:  Hand pain, left  Nausea  Anxiety    ED Discharge Orders    None       Maxwell CaulLayden, Lindsey A, PA-C 07/20/17 1750    Pricilla LovelessGoldston, Scott, MD 07/21/17 1958

## 2017-07-20 NOTE — ED Triage Notes (Signed)
Pt arrives to ED ambulatory with c/o of left hand pain that started last night, pt has no injury to hand, no redness or swelling full sensation and movement in hand, equal +3 radial pulses.  Pt reports nausea as well.

## 2017-07-20 NOTE — ED Notes (Signed)
Pt is back in lobby, states he went to get water out of his car.

## 2017-07-20 NOTE — ED Notes (Signed)
Pt states he took medications for anxiety and meclizine for anxiety.  Pt is stating nausea to upper abdomen, pain to left arm that started yesterday.  Pt denies chest pain. No sob, but states feels dizzy

## 2017-07-20 NOTE — ED Notes (Signed)
Pt is not in lobby, has been called several times for room assignment.

## 2017-07-20 NOTE — Discharge Instructions (Signed)
You can take Tylenol or Ibuprofen as directed for pain. You can alternate Tylenol and Ibuprofen every 4 hours. If you take Tylenol at 1pm, then you can take Ibuprofen at 5pm. Then you can take Tylenol again at 9pm.   Take your medication for anxiety as directed.  Make sure you are drinking plenty of fluids and staying hydrated.  Follow-up with your primary care doctor in the next 24-48 hours for further evaluation.  Return the emergency department for any worsening pain, chest pain, difficulty breathing, numbness or weakness of her arms or legs, vomiting or any other worsening or concerning symptoms.

## 2017-07-20 NOTE — ED Notes (Signed)
Pt called for room assignment. No answer 

## 2017-07-25 ENCOUNTER — Encounter (HOSPITAL_COMMUNITY): Payer: Self-pay | Admitting: *Deleted

## 2017-07-25 ENCOUNTER — Telehealth: Payer: Self-pay | Admitting: Internal Medicine

## 2017-07-25 ENCOUNTER — Emergency Department (HOSPITAL_COMMUNITY)
Admission: EM | Admit: 2017-07-25 | Discharge: 2017-07-25 | Disposition: A | Discharge status: Home / Self Care | Payer: Self-pay | Attending: Emergency Medicine | Admitting: Emergency Medicine

## 2017-07-25 ENCOUNTER — Other Ambulatory Visit: Payer: Self-pay

## 2017-07-25 ENCOUNTER — Emergency Department (HOSPITAL_COMMUNITY)
Admission: EM | Admit: 2017-07-25 | Discharge: 2017-07-25 | Discharge status: Against Medical Advice | Payer: Self-pay | Attending: Emergency Medicine | Admitting: Emergency Medicine

## 2017-07-25 DIAGNOSIS — M79622 Pain in left upper arm: Secondary | ICD-10-CM | POA: Insufficient documentation

## 2017-07-25 DIAGNOSIS — R200 Anesthesia of skin: Secondary | ICD-10-CM | POA: Insufficient documentation

## 2017-07-25 DIAGNOSIS — I10 Essential (primary) hypertension: Secondary | ICD-10-CM | POA: Insufficient documentation

## 2017-07-25 DIAGNOSIS — Z79899 Other long term (current) drug therapy: Secondary | ICD-10-CM | POA: Insufficient documentation

## 2017-07-25 DIAGNOSIS — F419 Anxiety disorder, unspecified: Secondary | ICD-10-CM | POA: Insufficient documentation

## 2017-07-25 DIAGNOSIS — R0789 Other chest pain: Secondary | ICD-10-CM | POA: Insufficient documentation

## 2017-07-25 DIAGNOSIS — E119 Type 2 diabetes mellitus without complications: Secondary | ICD-10-CM | POA: Insufficient documentation

## 2017-07-25 MED ORDER — PROMETHAZINE HCL 25 MG PO TABS: 25.0000 mg | ORAL_TABLET | Freq: Once | ORAL | Status: DC

## 2017-07-25 MED ORDER — LORAZEPAM 2 MG/ML IJ SOLN
1.0000 mg | Freq: Once | INTRAMUSCULAR | Status: AC
  Administered 2017-07-25: 1 mg via INTRAMUSCULAR
  Filled 2017-07-25: qty 1

## 2017-07-25 NOTE — ED Notes (Signed)
[facility].

## 2017-07-25 NOTE — ED Provider Notes (Signed)
[facility]-EMERGENCY DEPT Provider Note   CSN: [id number] Arrival date & time: 07/25/17  1143     History   Chief Complaint Chief Complaint  Patient presents with  . Chest Pain    HPI Wesley [name] is a 51 y.o. male.  Pt presents to the ED today with left sided chest pain.  The cp is tender with palpation and with movement.  The pt also c/o dizziness and left 4th and 5th finger numbness.   Pt also c/o elevated bp and anxiety.  This is pt's 82nd visit to the ED in the last 6 months for similar complaints.  [facility] by Dr. Hyacinth MeekerMiller.  Due to the wait time, he eloped and came here.   Pt does have a pcp who referred him to neurology.  He arrived for his neurology appt for an EMG on 11/8, but decided to leave and not get the test done due to the pain.  The pt is on bp meds and said he's been taking them.  He feels like the norvasc is raising his bp.  The pt feels like his ativan is not helping his nerves.        Past Medical History:  Diagnosis Date  . Alcohol abuse   . Anxiety   . Anxiety   . Chronic neck pain   . Cocaine abuse (HCC)   . Diabetes mellitus without complication (HCC)   . Hypertension     Patient Active Problem List   Diagnosis Date Noted  . 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 Prediabetes 04/23/2017  . Muscle spasm 04/23/2017  . Essential hypertension 04/16/2017  . H/O medication noncompliance 04/02/2017  . Vitamin D deficiency 04/02/2017  . DJD (degenerative joint disease) of cervical spine 03/16/2017  . 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 Dizziness 01/23/2017  . Anxiety 11/21/2016    Past Surgical History:  Procedure Laterality Date  . EYE SURGERY Right ~ 1980   "hit my eye"       Home Medications    Prior to Admission medications   Medication Sig Start Date End Date Taking? Authorizing Provider  amLODipine (NORVASC) 5 MG tablet Take 1.5 tablets (7.5 mg total) by mouth daily.  07/03/17   Marcine MatarJohnson, Deborah B, MD  busPIRone (BUSPAR) 7.5 MG tablet Take 1 tablet (7.5 mg total) by mouth 2 (two) times daily. Patient not taking: Reported on 07/25/2017 06/18/17   Marcine MatarJohnson, Deborah B, MD  cyclobenzaprine (FLEXERIL) 5 MG tablet Take 1 tablet (5 mg total) by mouth at bedtime as needed for muscle spasms. 06/22/17   Patel, Donika K, DO  LORazepam (ATIVAN) 0.5 MG tablet Take 1 tablet (0.5 mg total) by mouth 2 (two) times daily. for anxiety 07/09/17   Marcine MatarJohnson, Deborah B, MD  meclizine (ANTIVERT) 25 MG tablet Take 1 tablet (25 mg total) by mouth daily as needed for dizziness. 07/09/17   Marcine MatarJohnson, Deborah B, MD  mirtazapine (REMERON) 15 MG tablet Take 0.5 tablets (7.5 mg total) by mouth at bedtime. Patient not taking: Reported on 07/25/2017 07/03/17   Marcine MatarJohnson, Deborah B, MD    Family History Family History  Problem Relation Age of Onset  . Hypertension Mother   . Hypertension Father     Social History Social History   Tobacco Use  . Smoking status: Never Smoker  . Smokeless tobacco: Never Used  Substance Use Topics  . Alcohol use: No    Comment:  former  . Drug use: Yes    Comment: denies, reports past history      Allergies   Motrin [ibuprofen]; Other; and Zofran [ondansetron hcl]   Review of Systems Review of Systems  Cardiovascular: Positive for chest pain.  Neurological: Positive for dizziness and numbness.  Psychiatric/Behavioral: The patient is nervous/anxious.      Physical Exam Updated Vital Signs BP (!) 151/99   Pulse (!) 115   Temp 98.2 F (36.8 C) (Oral)   Resp 16   SpO2 100%   Physical Exam  Constitutional: He is oriented to person, place, and time. He appears well-developed and well-nourished.  HENT:  Head: Normocephalic and atraumatic.  Eyes: EOM are normal. Pupils are equal, round, and reactive to light.  Neck: Normal range of motion. Neck supple.  Cardiovascular: Tachycardia present.  Pulmonary/Chest: Effort normal and breath sounds  normal.  Abdominal: Soft. Bowel sounds are normal.  Musculoskeletal: Normal range of motion.       Right lower leg: Normal.       Left lower leg: Normal.  Neurological: He is alert and oriented to person, place, and time.  Skin: Skin is warm and dry. Capillary refill takes less than 2 seconds.  Psychiatric: His mood appears anxious.  Nursing note and vitals reviewed.    ED Treatments / Results  Labs (all labs ordered are listed, but only abnormal results are displayed) Labs Reviewed - No data to display  EKG  EKG Interpretation  Date/Time:  Wednesday July 25 2017 13:04:53 EST Ventricular Rate:  115 PR Interval:    QRS Duration: 97 QT Interval:  324 QTC Calculation: 449 R Axis:   -68 Text Interpretation:  Sinus tachycardia Left anterior fascicular block Abnormal R-wave progression, late transition ST elev, probable normal early repol pattern No significant change since last tracing Confirmed by Jacalyn LefevreHaviland, Ripken Rekowski [id number]) on 07/25/2017 1:48:34 PM       Radiology No results found.  Procedures Procedures (including critical care time)  Medications Ordered in ED Medications  LORazepam (ATIVAN) injection 1 mg (1 mg Intramuscular Given 07/25/17 1305)     Initial Impression / Assessment and Plan / ED Course  I have reviewed the triage vital signs and the nursing notes.  Pertinent labs & imaging results that were available during my care of the patient were reviewed by me and considered in my medical decision making (see chart for details).    Pt is feeling much better after ativan.  HR and BP have decreased.  Pt encouraged to f/u with pcp for these chronic issues.  He does know to return if worse.  Final Clinical Impressions(s) / ED Diagnoses   Final diagnoses:  Atypical chest pain  Anxiety    ED Discharge Orders    None       Jacalyn LefevreHaviland, Blanch Stang, MD 07/25/17 [phone]

## 2017-07-25 NOTE — ED Triage Notes (Signed)
Pt complains of left sided chest pain and dizziness since this morning. [facility] d/t wait time.

## 2017-07-25 NOTE — ED Provider Notes (Signed)
[facility]   CSN: [id number] Arrival date & time: 07/25/17  [id number]     History   Chief Complaint Chief Complaint  Patient presents with  . Hand Pain  . Dizziness    HPI Wesley [name] is a 51 y.o. male.  Numbness in left hand digits 3 and 4 for 1 week and pain in the left axilla since 4 AM today.  Patient is a regular visitor to the emergency department.  He has a care plan in place.  No substernal chest pain, dyspnea, gross neurological deficits, fever, sweats, chills.  Severity is mild to moderate.  He allegedly had an accident several months ago resulting in neck pain.      Past Medical History:  Diagnosis Date  . Alcohol abuse   . Anxiety   . Anxiety   . Chronic neck pain   . Cocaine abuse (HCC)   . Diabetes mellitus without complication (HCC)   . Hypertension     Patient Active Problem List   Diagnosis Date Noted  . 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 Prediabetes 04/23/2017  . Muscle spasm 04/23/2017  . Essential hypertension 04/16/2017  . H/O medication noncompliance 04/02/2017  . Vitamin D deficiency 04/02/2017  . DJD (degenerative joint disease) of cervical spine 03/16/2017  . 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 Dizziness 01/23/2017  . Anxiety 11/21/2016    Past Surgical History:  Procedure Laterality Date  . EYE SURGERY Right ~ 1980   "hit my eye"       Home Medications    Prior to Admission medications   Medication Sig Start Date End Date Taking? Authorizing Provider  amLODipine (NORVASC) 5 MG tablet Take 1.5 tablets (7.5 mg total) by mouth daily. 07/03/17  Yes Marcine MatarJohnson, Deborah B, MD  cyclobenzaprine (FLEXERIL) 5 MG tablet Take 1 tablet (5 mg total) by mouth at bedtime as needed for muscle spasms. 06/22/17  Yes Patel, Donika K, DO  LORazepam (ATIVAN) 0.5 MG tablet Take 1 tablet (0.5 mg total) by mouth 2 (two) times daily. for anxiety 07/09/17  Yes Marcine MatarJohnson, Deborah B, MD    meclizine (ANTIVERT) 25 MG tablet Take 1 tablet (25 mg total) by mouth daily as needed for dizziness. 07/09/17  Yes Marcine MatarJohnson, Deborah B, MD  busPIRone (BUSPAR) 7.5 MG tablet Take 1 tablet (7.5 mg total) by mouth 2 (two) times daily. Patient not taking: Reported on 07/25/2017 06/18/17   Marcine MatarJohnson, Deborah B, MD  mirtazapine (REMERON) 15 MG tablet Take 0.5 tablets (7.5 mg total) by mouth at bedtime. Patient not taking: Reported on 07/25/2017 07/03/17   Marcine MatarJohnson, Deborah B, MD    Family History Family History  Problem Relation Age of Onset  . Hypertension Mother   . Hypertension Father     Social History Social History   Tobacco Use  . Smoking status: Never Smoker  . Smokeless tobacco: Never Used  Substance Use Topics  . Alcohol use: No    Comment: former  . Drug use: Yes    Comment: denies, reports past history      Allergies   Motrin [ibuprofen]; Other; and Zofran [ondansetron hcl]   Review of Systems Review of Systems  All other systems reviewed and are negative.    Physical Exam Updated Vital Signs BP (!) 135/97 (BP Location: Right Arm)   Pulse (!) 107   Temp 97.8 F (36.6 C) (Oral)   Resp 20   Ht 5\' 6"  (1.676 m)  Wt 86.6 kg (191 lb)   SpO2 97%   BMI 30.83 kg/m   Physical Exam  Constitutional: He is oriented to person, place, and time. He appears well-developed and well-nourished.  HENT:  Head: Normocephalic and atraumatic.  Eyes: Conjunctivae are normal.  Neck: Neck supple.  Cardiovascular: Normal rate and regular rhythm.  Pulmonary/Chest: Effort normal and breath sounds normal.  Abdominal: Soft. Bowel sounds are normal.  Musculoskeletal: Normal range of motion.  Neurological: He is alert and oriented to person, place, and time.  Full range of motion of left upper extremity.  Skin: Skin is warm and dry.  Minimal tenderness left axilla  Psychiatric: He has a normal mood and affect. His behavior is normal.  Nursing note and vitals reviewed.    ED  Treatments / Results  Labs (all labs ordered are listed, but only abnormal results are displayed) Labs Reviewed - No data to display  EKG  EKG Interpretation  Date/Time:  Wednesday July 25 2017 09:36:30 EST Ventricular Rate:  105 PR Interval:    QRS Duration: 102 QT Interval:  337 QTC Calculation: 446 R Axis:   -62 Text Interpretation:  Sinus tachycardia Left anterior fascicular block Abnormal R-wave progression, late transition Left ventricular hypertrophy Borderline T abnormalities, inferior leads ST elev, probable normal early repol pattern Confirmed by Donnetta Hutchingook, Brian ([id number]) on 07/25/2017 10:20:54 AM       Radiology No results found.  Procedures Procedures (including critical care time)  Medications Ordered in ED Medications - No data to display   Initial Impression / Assessment and Plan / ED Course  I have reviewed the triage vital signs and the nursing notes.  Pertinent labs & imaging results that were available during my care of the patient were reviewed by me and considered in my medical decision making (see chart for details).     Patient complains of numbness in digits 3 and 4 in the left hand.  Additionally he has point tenderness in his left axilla.  CT cervical spine from 03/09/17 reveals no fracture or subluxation.  He does have mild to moderate multilevel degenerative changes.  Patient is not neurologically impaired.  He is not psychotic.  He left AGAINST MEDICAL ADVICE without my knowledge.  Final Clinical Impressions(s) / ED Diagnoses   Final diagnoses:  None    ED Discharge Orders    None       Donnetta Hutchingook, Brian, MD 07/25/17 1202

## 2017-07-25 NOTE — ED Triage Notes (Signed)
Pt states L hand pain and L axillary pain that increases with movement.  Also c/o dizziness.  Symptoms since yesterday.

## 2017-07-25 NOTE — ED Notes (Signed)
Pt left w/o notifying care team

## 2017-07-25 NOTE — ED Notes (Signed)
ED Provider at bedside. 

## 2017-07-26 ENCOUNTER — Telehealth: Payer: Self-pay | Admitting: Internal Medicine

## 2017-07-26 NOTE — Telephone Encounter (Signed)
Call placed to patient #[phone] to check on his status and relate PCP's message. Spoke with patient and he stated that he was doing ok. Nothing had change regarding to his anxiety and pain. Asked patient if he had followed up with [facility] or [facility] and patient said yes. Patient saw Dr. Anabel Benehris Harper at [facility] [place]. There patient was prescribed Lorazepam and Escitalopram. Patient stated that he doesn't believe that Escitalopram works. When I asked patient when he saw Dr. Clearance CootsHarper he said two weeks ago. Patient can't remember exactly what day it was.   Informed patient of what PCP stated in her notes, "Let pt know that I will no longer prescribe Lorazepam for him at all. So when he is done with the current prescription that he got from [place], he needs to make other arrangements to get the medication as I will no longer prescribe". Patient was not happy with this and stated that PCP has never helped him with his condition. Although patient felt this way he still scheduled an appointment on 11/19 with PCP.

## 2017-07-26 NOTE — Telephone Encounter (Signed)
Opened in error

## 2017-07-29 ENCOUNTER — Emergency Department (HOSPITAL_COMMUNITY): Admission: EM | Admit: 2017-07-29 | Discharge: 2017-07-29 | Discharge status: Against Medical Advice | Payer: Self-pay

## 2017-07-29 ENCOUNTER — Emergency Department (HOSPITAL_COMMUNITY)
Admission: EM | Admit: 2017-07-29 | Discharge: 2017-07-29 | Disposition: A | Discharge status: Home / Self Care | Payer: Self-pay | Attending: Emergency Medicine | Admitting: Emergency Medicine

## 2017-07-29 ENCOUNTER — Other Ambulatory Visit: Payer: Self-pay

## 2017-07-29 ENCOUNTER — Encounter (HOSPITAL_COMMUNITY): Payer: Self-pay

## 2017-07-29 DIAGNOSIS — R42 Dizziness and giddiness: Secondary | ICD-10-CM | POA: Insufficient documentation

## 2017-07-29 DIAGNOSIS — Z5321 Procedure and treatment not carried out due to patient leaving prior to being seen by health care provider: Secondary | ICD-10-CM | POA: Insufficient documentation

## 2017-07-29 DIAGNOSIS — R109 Unspecified abdominal pain: Secondary | ICD-10-CM | POA: Insufficient documentation

## 2017-07-29 NOTE — ED Notes (Signed)
Called to room, no response

## 2017-07-29 NOTE — ED Triage Notes (Signed)
Pt arrives via EMS from home with dizziness, nausea, left neck pain radiating to left arm and chest that began this morning about 930 AM. [facility] due to wait times. Pt reports increased anxiety this afternoon prior to calling EMS. States took meclizine, amolodipine and ativan x2 today with no improvement in symtoms. Pt received 324mg  ASA PTA. 18g LAC. Pt alert, oriented x4. C/o nausea and squeezing in neck at present.

## 2017-07-29 NOTE — ED Notes (Signed)
Patient never showed up for triage.  Removed from system at this time.

## 2017-07-29 NOTE — ED Notes (Signed)
Pt went to [facility]

## 2017-07-29 NOTE — ED Triage Notes (Signed)
Pt c/o abdominal pain and dizziness after taking his blood pressure medication this morning. Denies N/V. A&Ox4. Ambulatory.

## 2017-07-29 NOTE — ED Notes (Signed)
Pt has not returned to ED waiting area

## 2017-07-29 NOTE — ED Notes (Signed)
No answer for blood draw.

## 2017-07-29 NOTE — ED Notes (Signed)
No answer when called to triage.

## 2017-07-29 NOTE — ED Triage Notes (Addendum)
Still no answer when called.  Info documented on wrong pt

## 2017-07-30 ENCOUNTER — Emergency Department (HOSPITAL_COMMUNITY)
Admission: EM | Admit: 2017-07-30 | Discharge: 2017-07-30 | Disposition: A | Discharge status: Home / Self Care | Payer: Self-pay | Attending: Emergency Medicine | Admitting: Emergency Medicine

## 2017-07-30 ENCOUNTER — Ambulatory Visit: Payer: Self-pay | Admitting: Internal Medicine

## 2017-07-30 ENCOUNTER — Encounter (HOSPITAL_COMMUNITY): Payer: Self-pay

## 2017-07-30 ENCOUNTER — Other Ambulatory Visit: Payer: Self-pay

## 2017-07-30 DIAGNOSIS — E119 Type 2 diabetes mellitus without complications: Secondary | ICD-10-CM | POA: Insufficient documentation

## 2017-07-30 DIAGNOSIS — R1013 Epigastric pain: Secondary | ICD-10-CM | POA: Insufficient documentation

## 2017-07-30 DIAGNOSIS — I10 Essential (primary) hypertension: Secondary | ICD-10-CM | POA: Insufficient documentation

## 2017-07-30 DIAGNOSIS — R42 Dizziness and giddiness: Secondary | ICD-10-CM | POA: Insufficient documentation

## 2017-07-30 DIAGNOSIS — Z79899 Other long term (current) drug therapy: Secondary | ICD-10-CM | POA: Insufficient documentation

## 2017-07-30 LAB — CBG MONITORING, ED: Glucose-Capillary: 99 mg/dL (ref 65–99)

## 2017-07-30 MED ORDER — LORAZEPAM 0.5 MG PO TABS
0.5000 mg | ORAL_TABLET | Freq: Once | ORAL | Status: AC
  Administered 2017-07-30: 0.5 mg via ORAL
  Filled 2017-07-30: qty 1

## 2017-07-30 NOTE — Discharge Instructions (Signed)
Please continue to take your home medications, as prescribed.  Follow-up with your primary care provider.  May also follow-up with the ear nose and throat (ENT) specialist.  Call the number provided to set up an appointment.

## 2017-07-30 NOTE — ED Provider Notes (Signed)
[facility]-EMERGENCY DEPT Provider Note   CSN: [id number] Arrival date & time: 07/30/17  [id number]     History   Chief Complaint Chief Complaint  Patient presents with  . Abdominal Pain  . Dizziness    HPI Wesley [name] is a 51 y.o. male.  HPI   Wesley LitterJose Curenton is a 51 y.o. male, with a history of alcohol abuse, anxiety, cocaine abuse, DM, and HTN, presenting to the ED with dizziness and abdominal pain he initially states began yesterday, but then admit these complaints have been present for almost a year.  Abdominal pain is epigastric, aching, 5/10, nonradiating.  Dizziness is episodic and resolves with lying down.  Also endorses left ear congestion.  Denies fever/chills, shortness of breath, chest pain, N/V/D, diaphoresis, headache, neuro deficits, falls/trauma, hematochezia/melena, or any other complaints.      Past Medical History:  Diagnosis Date  . Alcohol abuse   . Anxiety   . Anxiety   . Chronic neck pain   . Cocaine abuse (HCC)   . Diabetes mellitus without complication (HCC)   . Hypertension     Patient Active Problem List   Diagnosis Date Noted  . 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 Prediabetes 04/23/2017  . Muscle spasm 04/23/2017  . Essential hypertension 04/16/2017  . H/O medication noncompliance 04/02/2017  . Vitamin D deficiency 04/02/2017  . DJD (degenerative joint disease) of cervical spine 03/16/2017  . 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 Dizziness 01/23/2017  . Anxiety 11/21/2016    Past Surgical History:  Procedure Laterality Date  . EYE SURGERY Right ~ 1980   "hit my eye"       Home Medications    Prior to Admission medications   Medication Sig Start Date End Date Taking? Authorizing Provider  amLODipine (NORVASC) 5 MG tablet Take 1.5 tablets (7.5 mg total) by mouth daily. 07/03/17  Yes Marcine MatarJohnson, Deborah B, MD  LORazepam (ATIVAN) 0.5 MG tablet Take 1 tablet (0.5 mg total) by mouth 2 (two) times daily.  for anxiety 07/09/17  Yes Marcine MatarJohnson, Deborah B, MD  meclizine (ANTIVERT) 25 MG tablet Take 1 tablet (25 mg total) by mouth daily as needed for dizziness. 07/09/17  Yes Marcine MatarJohnson, Deborah B, MD  methocarbamol (ROBAXIN) 500 MG tablet Take 500 mg daily as needed by mouth for muscle spasms. 09/20/16  Yes [provider]  busPIRone (BUSPAR) 7.5 MG tablet Take 1 tablet (7.5 mg total) by mouth 2 (two) times daily. Patient not taking: Reported on 07/25/2017 06/18/17   Marcine MatarJohnson, Deborah B, MD  cyclobenzaprine (FLEXERIL) 5 MG tablet Take 1 tablet (5 mg total) by mouth at bedtime as needed for muscle spasms. Patient not taking: Reported on 07/30/2017 06/22/17   Nita SicklePatel, Donika K, DO  mirtazapine (REMERON) 15 MG tablet Take 0.5 tablets (7.5 mg total) by mouth at bedtime. Patient not taking: Reported on 07/25/2017 07/03/17   Marcine MatarJohnson, Deborah B, MD    Family History Family History  Problem Relation Age of Onset  . Hypertension Mother   . Hypertension Father     Social History Social History   Tobacco Use  . Smoking status: Never Smoker  . Smokeless tobacco: Never Used  Substance Use Topics  . Alcohol use: No    Comment: former  . Drug use: No    Comment: denies, reports past history      Allergies   Motrin [ibuprofen]; Other; and Zofran [ondansetron hcl]   Review of Systems Review of Systems  Constitutional: Negative for chills, diaphoresis and fever.  Respiratory: Negative for shortness of breath.   Cardiovascular: Negative for chest pain.  Gastrointestinal: Positive for abdominal pain. Negative for diarrhea, nausea and vomiting.  Genitourinary: Negative for dysuria, flank pain, frequency and urgency.  Musculoskeletal: Negative for back pain.  Neurological: Positive for dizziness. Negative for syncope, weakness, numbness and headaches.  All other systems reviewed and are negative.    Physical Exam Updated Vital Signs BP 129/85 (BP Location: Left Arm)   Pulse 97   Temp 98.3 F  (36.8 C) (Oral)   Resp 16   Ht 5\' 6"  (1.676 m)   Wt 84.4 kg (186 lb)   SpO2 100%   BMI 30.02 kg/m   Physical Exam  Constitutional: He is oriented to person, place, and time. He appears well-developed and well-nourished. No distress.  When asked to do so, patient sits upright without noted difficulty.  He swings his legs off the bed and goes to a standing position without hesitation or noted difficulty.  HENT:  Head: Normocephalic and atraumatic.  Mouth/Throat: Oropharynx is clear and moist.  Eyes: Conjunctivae and EOM are normal. Pupils are equal, round, and reactive to light.  Neck: Normal range of motion. Neck supple.  Cardiovascular: Normal rate, regular rhythm, normal heart sounds and intact distal pulses.  Pulmonary/Chest: Effort normal and breath sounds normal. No respiratory distress.  Abdominal: Soft. There is no tenderness. There is no guarding.  Completely benign abdominal exam.  Musculoskeletal: He exhibits no edema.  Normal motor function intact in all extremities and spine. No midline spinal tenderness.   Lymphadenopathy:    He has no cervical adenopathy.  Neurological: He is alert and oriented to person, place, and time.  No sensory deficits.  No noted speech deficits. No aphasia. Patient handles oral secretions without difficulty. No noted swallowing defects.  Equal grip strength bilaterally. Strength 5/5 in the upper extremities. Strength 5/5 with flexion and extension of the hips, knees, and ankles bilaterally.  Patellar DTRs 2+ bilaterally. Negative Romberg. No gait disturbance.  Coordination intact including heel to shin and finger to nose.  Cranial nerves III-XII grossly intact.  No facial droop.   Skin: Skin is warm and dry. Capillary refill takes less than 2 seconds. He is not diaphoretic.  Psychiatric: He has a normal mood and affect. His behavior is normal.  Nursing note and vitals reviewed.    ED Treatments / Results  Labs (all labs ordered are  listed, but only abnormal results are displayed) Labs Reviewed - No data to display  EKG  EKG Interpretation None       Radiology No results found.  Procedures Procedures (including critical care time)  Medications Ordered in ED Medications  LORazepam (ATIVAN) tablet 0.5 mg (not administered)     Initial Impression / Assessment and Plan / ED Course  I have reviewed the triage vital signs and the nursing notes.  Pertinent labs & imaging results that were available during my care of the patient were reviewed by me and considered in my medical decision making (see chart for details).     Patient presents with chronic and recurrent abdominal pain and dizziness.  He has been seen many times in the ED for similar complaints.  Abdominal exam benign.  Vital signs encouraging.  Patient encouraged to follow-up with his PCP as well as his neurologist.  ENT referral also given. The patient was given instructions for home care as well as return precautions. Patient voices understanding of these  instructions, accepts the plan, and is comfortable with discharge.  Vitals:   07/30/17 0713 07/30/17 0757  BP: 129/85 129/88  Pulse: 97 94  Resp: 16   Temp: 98.3 F (36.8 C)   TempSrc: Oral   SpO2: 100% 98%  Weight: 84.4 kg (186 lb)   Height: 5\' 6"  (1.676 m)      Final Clinical Impressions(s) / ED Diagnoses   Final diagnoses:  Dizziness  Epigastric pain    ED Discharge Orders    None       Concepcion Living 07/30/17 0910    Raeford Razor, MD 07/30/17 [phone]

## 2017-07-30 NOTE — ED Notes (Signed)
PT SE[facility] - STATES ONLY TRIAGE HOWEVER DID NOT WAIT. WENT HOME.

## 2017-07-30 NOTE — ED Triage Notes (Signed)
Patient c/o mid epigastric pain and nausea x 2-3 weeks, but worse yesterday. Patient also c/o intermittent posterior neck and upper back pain x 8 months

## 2017-07-30 NOTE — ED Notes (Signed)
URINE COLLECTED AND AT BEDSIDE 

## 2017-08-01 ENCOUNTER — Ambulatory Visit (HOSPITAL_COMMUNITY)
Admission: EM | Admit: 2017-08-01 | Discharge: 2017-08-01 | Disposition: A | Discharge status: Home / Self Care | Payer: Self-pay

## 2017-08-01 ENCOUNTER — Ambulatory Visit: Payer: Self-pay

## 2017-08-01 ENCOUNTER — Emergency Department (HOSPITAL_COMMUNITY)
Admission: EM | Admit: 2017-08-01 | Discharge: 2017-08-01 | Disposition: A | Discharge status: Home / Self Care | Payer: Self-pay | Attending: Emergency Medicine | Admitting: Emergency Medicine

## 2017-08-01 ENCOUNTER — Other Ambulatory Visit: Payer: Self-pay

## 2017-08-01 ENCOUNTER — Encounter (HOSPITAL_COMMUNITY): Payer: Self-pay | Admitting: Neurology

## 2017-08-01 DIAGNOSIS — R109 Unspecified abdominal pain: Secondary | ICD-10-CM

## 2017-08-01 DIAGNOSIS — Z79899 Other long term (current) drug therapy: Secondary | ICD-10-CM | POA: Insufficient documentation

## 2017-08-01 DIAGNOSIS — I10 Essential (primary) hypertension: Secondary | ICD-10-CM | POA: Insufficient documentation

## 2017-08-01 DIAGNOSIS — E119 Type 2 diabetes mellitus without complications: Secondary | ICD-10-CM | POA: Insufficient documentation

## 2017-08-01 DIAGNOSIS — R1010 Upper abdominal pain, unspecified: Secondary | ICD-10-CM | POA: Insufficient documentation

## 2017-08-01 NOTE — ED Notes (Signed)
Dr. Patria Maneampos at bedside assessing patient.

## 2017-08-01 NOTE — ED Triage Notes (Addendum)
Pt reports since 0400 this morning epigastric abdominal pain, dizziness. Denies v/d. Feels nauseated. Ambulatory. Is a x 4.

## 2017-08-05 ENCOUNTER — Ambulatory Visit (HOSPITAL_COMMUNITY)
Admission: EM | Admit: 2017-08-05 | Discharge: 2017-08-05 | Disposition: A | Discharge status: Home / Self Care | Payer: Self-pay | Attending: Family Medicine | Admitting: Family Medicine

## 2017-08-05 ENCOUNTER — Encounter (HOSPITAL_COMMUNITY): Payer: Self-pay | Admitting: Emergency Medicine

## 2017-08-05 DIAGNOSIS — F411 Generalized anxiety disorder: Secondary | ICD-10-CM

## 2017-08-05 DIAGNOSIS — F419 Anxiety disorder, unspecified: Secondary | ICD-10-CM

## 2017-08-05 MED ORDER — LORAZEPAM 0.5 MG PO TABS: 0.5000 mg | ORAL_TABLET | Freq: Two times a day (BID) | ORAL | 1 refills | Status: DC

## 2017-08-05 NOTE — ED Provider Notes (Signed)
[facility]   [id number] 08/05/17 Arrival Time: 1225   SUBJECTIVE:  Wesley LitterJose Thiem is a 51 y.o. male who presents to the urgent care with complaint of mid abd pain with vomiting x 1 today; pt with multiple visits in past; pt appears anxious with hx of same     He has done well on lorazepam 0.5 mg bid but he ran out.   Patient works as a Education administratorpainter.     Past Medical History:  Diagnosis Date  . Alcohol abuse   . Anxiety   . Anxiety   . Chronic neck pain   . Cocaine abuse (HCC)   . Diabetes mellitus without complication (HCC)   . Hypertension    Family History  Problem Relation Age of Onset  . Hypertension Mother   . Hypertension Father    Social History   Socioeconomic History  . Marital status: Significant Other    Spouse name: Not on file  . Number of children: Not on file  . Years of education: Not on file  . Highest education level: Not on file  Social Needs  . Financial resource strain: Not on file  . Food insecurity - worry: Not on file  . Food insecurity - inability: Not on file  . Transportation needs - medical: Not on file  . Transportation needs - non-medical: Not on file  Occupational History  . Not on file  Tobacco Use  . Smoking status: Never Smoker  . Smokeless tobacco: Never Used  Substance and Sexual Activity  . Alcohol use: No    Comment: former  . Drug use: No    Comment: denies, reports past history   . Sexual activity: Not on file  Other Topics Concern  . Not on file  Social History Narrative  . Not on file   No outpatient medications have been marked as taking for the 08/05/17 encounter [facility](Hospital Encounter).   Allergies  Allergen Reactions  . Motrin [Ibuprofen] Other (See Comments)    Makes his throat get "very dry" after taking it  . Other Other (See Comments)    Steroids: Patient received a steroid shot from [facility] (336) [phone] and it resulted in his muscles twitching all over his body  . Zofran  [Ondansetron Hcl] Other (See Comments)    Causes sweating and body "feels badly"      ROS: As per HPI, remainder of ROS negative.   OBJECTIVE:   Vitals:   08/05/17 1316  BP: (!) 130/92  Pulse: 99  Resp: (!) 22  Temp: 98.5 F (36.9 C)  TempSrc: Oral  SpO2: 96%     General appearance: alert; no distress Eyes: PERRL; EOMI; conjunctiva normal HENT: normocephalic; atraumatic; canal normal, external ears normal without trauma; nasal mucosa normal; oral mucosa normal Neck: supple Lungs: clear to auscultation bilaterally Heart: regular rate and rhythm Abdomen: soft, non-tender; bowel sounds normal; no masses or organomegaly; no guarding or rebound tenderness Back: no CVA tenderness Extremities: no cyanosis or edema; symmetrical with no gross deformities Skin: warm and dry Neurologic: normal gait; grossly normal Psychological: alert and cooperative; normal mood and affect   Labs:  Results for orders placed or performed during the hospital encounter of 07/30/17  POC CBG, ED  Result Value Ref Range   Glucose-Capillary 99 65 - 99 mg/dL    Labs Reviewed - No data to display  No results found.     ASSESSMENT & PLAN:  1. Generalized anxiety disorder   2.  Anxiety disorder, unspecified type     Meds ordered this encounter  Medications  . LORazepam (ATIVAN) 0.5 MG tablet    Sig: Take 1 tablet (0.5 mg total) by mouth 2 (two) times daily. for anxiety    Dispense:  60 tablet    Refill:  1    Reviewed expectations re: course of current medical issues. Questions answered. Outlined signs and symptoms indicating need for more acute intervention. Patient verbalized understanding. After Visit Summary given.    Procedures:      Elvina SidleLauenstein, Hisashi Amadon, MD 08/05/17 1326

## 2017-08-05 NOTE — Discharge Instructions (Signed)
Keep your appointment in two weeks.

## 2017-08-05 NOTE — ED Triage Notes (Signed)
Pt here for mid abd pain with vomiting x 1 today; pt with multiple visits in past; pt appears anxious with hx of same

## 2017-08-08 NOTE — ED Provider Notes (Signed)
[facility] EMERGENCY [facility]PARTMENT Provider Note   CSN: [id number] Arrival date & time: 08/01/17  1102     History   Chief Complaint Chief Complaint  Patient presents with  . Abdominal Pain  . Dizziness    HPI Johnnette LitterJose Bogdan is a 51 y.o. male.  HPI Patient is a 51 year old male well-known to myself in this emergency department approximately 70 visits to the emergency department in the past 6 months.  He presents the emergency department today complaining of generalized upper abdominal pain with nausea.  No vomiting.  No diarrhea.  No fevers or chills.  Long-standing history of anxiety.No fevers or chills at this time.  No dysuria or urinary frequency.   Past Medical History:  Diagnosis Date  . Alcohol abuse   . Anxiety   . Anxiety   . Chronic neck pain   . Cocaine abuse (HCC)   . Diabetes mellitus without complication (HCC)   . Hypertension     Patient Active Problem List   Diagnosis Date Noted  . 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 Prediabetes 04/23/2017  . Muscle spasm 04/23/2017  . Essential hypertension 04/16/2017  . H/O medication noncompliance 04/02/2017  . Vitamin D deficiency 04/02/2017  . DJD (degenerative joint disease) of cervical spine 03/16/2017  . 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 Dizziness 01/23/2017  . Anxiety 11/21/2016    Past Surgical History:  Procedure Laterality Date  . EYE SURGERY Right ~ 1980   "hit my eye"       Home Medications    Prior to Admission medications   Medication Sig Start Date End Date Taking? Authorizing Provider  amLODipine (NORVASC) 5 MG tablet Take 1.5 tablets (7.5 mg total) by mouth daily. 07/03/17   Marcine MatarJohnson, Deborah B, MD  LORazepam (ATIVAN) 0.5 MG tablet Take 1 tablet (0.5 mg total) by mouth 2 (two) times daily. for anxiety 08/05/17   Elvina SidleLauenstein, Kurt, MD    Family History Family History  Problem Relation Age of Onset  . Hypertension Mother   . Hypertension Father      Social History Social History   Tobacco Use  . Smoking status: Never Smoker  . Smokeless tobacco: Never Used  Substance Use Topics  . Alcohol use: No    Comment: former  . Drug use: No    Comment: denies, reports past history      Allergies   Motrin [ibuprofen]; Other; and Zofran [ondansetron hcl]   Review of Systems Review of Systems  All other systems reviewed and are negative.    Physical Exam Updated Vital Signs BP (!) 140/97 (BP Location: Right Arm)   Pulse 98   Temp 98 F (36.7 C) (Oral)   Resp 17   SpO2 96%   Physical Exam  Constitutional: He is oriented to person, place, and time. He appears well-developed and well-nourished.  HENT:  Head: Normocephalic.  Eyes: EOM are normal.  Neck: Normal range of motion.  Pulmonary/Chest: Effort normal.  Abdominal: Soft. He exhibits no distension and no mass. There is no tenderness. There is no rebound and no guarding.  Musculoskeletal: Normal range of motion.  Neurological: He is alert and oriented to person, place, and time.  Psychiatric: He has a normal mood and affect.  Nursing note and vitals reviewed.    ED Treatments / Results  Labs (all labs ordered are listed, but only abnormal results are displayed) Labs Reviewed - No data to display  EKG  EKG Interpretation  Date/Time:  Wednesday August 01 2017 11:21:15 EST Ventricular Rate:  93 PR Interval:  132 QRS Duration: 104 QT Interval:  358 QTC Calculation: 445 R Axis:   -54 Text Interpretation:  Normal sinus rhythm Left axis deviation Minimal voltage criteria for LVH, may be normal variant Confirmed by Nicanor AlconPalumbo, April ([id number]) on 08/02/2017 9:19:50 AM       Radiology No results found.  Procedures Procedures (including critical care time)  Medications Ordered in ED Medications - No data to display   Initial Impression / Assessment and Plan / ED Course  I have reviewed the triage vital signs and the nursing notes.  Pertinent labs &  imaging results that were available during my care of the patient were reviewed by me and considered in my medical decision making (see chart for details).    Abdomen nontender.  Medical screening examination complete.  No life-threatening emergency.  This is a chronic complaint for the patient.   Final Clinical Impressions(s) / ED Diagnoses   Final diagnoses:  Recurrent abdominal pain    ED Discharge Orders    None      Azalia Bilisampos, Kevin, MD 08/08/17 [phone]

## 2017-08-13 ENCOUNTER — Encounter (HOSPITAL_COMMUNITY): Payer: Self-pay | Admitting: Emergency Medicine

## 2017-08-13 ENCOUNTER — Other Ambulatory Visit: Payer: Self-pay

## 2017-08-13 ENCOUNTER — Emergency Department (HOSPITAL_COMMUNITY)
Admission: EM | Admit: 2017-08-13 | Discharge: 2017-08-13 | Disposition: A | Discharge status: Home / Self Care | Payer: Self-pay | Attending: Emergency Medicine | Admitting: Emergency Medicine

## 2017-08-13 DIAGNOSIS — Z79899 Other long term (current) drug therapy: Secondary | ICD-10-CM | POA: Insufficient documentation

## 2017-08-13 DIAGNOSIS — F419 Anxiety disorder, unspecified: Secondary | ICD-10-CM | POA: Insufficient documentation

## 2017-08-13 DIAGNOSIS — E119 Type 2 diabetes mellitus without complications: Secondary | ICD-10-CM | POA: Insufficient documentation

## 2017-08-13 DIAGNOSIS — I10 Essential (primary) hypertension: Secondary | ICD-10-CM | POA: Insufficient documentation

## 2017-08-13 LAB — CBG MONITORING, ED: Glucose-Capillary: 137 mg/dL — ABNORMAL HIGH (ref 65–99)

## 2017-08-13 NOTE — Discharge Instructions (Signed)
We feel your symptoms are likely due to your anxiety. Continue your home medications. Follow-up with your primary care doctor as scheduled-- call for sooner appt if needed. Return here for new concerns.

## 2017-08-13 NOTE — ED Triage Notes (Signed)
Patient states he woke up and was hot from head to toe. Patient states he is feeling dizzy also. He states it started yesterday.
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
## 2017-08-16 ENCOUNTER — Ambulatory Visit (HOSPITAL_COMMUNITY)
Admission: EM | Admit: 2017-08-16 | Discharge: 2017-08-16 | Disposition: A | Discharge status: Home / Self Care | Payer: Self-pay | Attending: Family Medicine | Admitting: Family Medicine

## 2017-08-16 ENCOUNTER — Other Ambulatory Visit: Payer: Self-pay

## 2017-08-16 ENCOUNTER — Emergency Department (HOSPITAL_COMMUNITY)
Admission: EM | Admit: 2017-08-16 | Discharge: 2017-08-16 | Disposition: A | Discharge status: Home / Self Care | Payer: Self-pay | Attending: Emergency Medicine | Admitting: Emergency Medicine

## 2017-08-16 ENCOUNTER — Encounter (HOSPITAL_COMMUNITY): Payer: Self-pay | Admitting: Emergency Medicine

## 2017-08-16 ENCOUNTER — Encounter (HOSPITAL_COMMUNITY): Payer: Self-pay

## 2017-08-16 DIAGNOSIS — Z5321 Procedure and treatment not carried out due to patient leaving prior to being seen by health care provider: Secondary | ICD-10-CM | POA: Insufficient documentation

## 2017-08-16 DIAGNOSIS — I10 Essential (primary) hypertension: Secondary | ICD-10-CM

## 2017-08-16 DIAGNOSIS — M542 Cervicalgia: Secondary | ICD-10-CM | POA: Insufficient documentation

## 2017-08-16 DIAGNOSIS — R0602 Shortness of breath: Secondary | ICD-10-CM | POA: Insufficient documentation

## 2017-08-16 DIAGNOSIS — M62838 Other muscle spasm: Secondary | ICD-10-CM

## 2017-08-16 MED ORDER — CYCLOBENZAPRINE HCL 10 MG PO TABS: 10.0000 mg | ORAL_TABLET | Freq: Two times a day (BID) | ORAL | 0 refills | Status: DC | PRN

## 2017-08-16 NOTE — ED Triage Notes (Signed)
Pt here for body aches; pt seen multiple times in ED and [facility] for same; pt appears anxious

## 2017-08-16 NOTE — ED Notes (Signed)
Pt requesting to have his vital signs rechecked.

## 2017-08-16 NOTE — ED Triage Notes (Signed)
Pt endorses neck pain and shob that began 2 hours ago. Pt states "this is not my anxiety" VSS.

## 2017-08-16 NOTE — Care Management (Signed)
ED CM noted patient to have had 62 ED visit none which resulted in hospitalization. Patient is known to this CM, we have been working closely with the [facility] CM Erskine SquibbJane to assist patient with meeting needs and address barriers to reduce ED frequency.  It appears patient was last appointment at the clinic was on 11/19 in which [facility] no showed and [facility].  CM awaiting patient be roomed to meet with patient.

## 2017-08-16 NOTE — Discharge Instructions (Signed)
Heating pad to upper back as advised. Bending and stretching exercises as tolerated. Keep BP log and FU with PCP for elevated BP
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
## 2017-08-16 NOTE — ED Notes (Signed)
Pt left post triage, called multiple times in waiting and no response.

## 2017-08-16 NOTE — ED Notes (Signed)
Patient called for rooming by Ladona Ridgelaylor, RN, did not answer

## 2017-08-20 ENCOUNTER — Emergency Department (HOSPITAL_COMMUNITY)
Admission: EM | Admit: 2017-08-20 | Discharge: 2017-08-20 | Disposition: A | Discharge status: Home / Self Care | Payer: Self-pay | Attending: Emergency Medicine | Admitting: Emergency Medicine

## 2017-08-20 ENCOUNTER — Encounter (HOSPITAL_COMMUNITY): Payer: Self-pay | Admitting: Emergency Medicine

## 2017-08-20 DIAGNOSIS — R110 Nausea: Secondary | ICD-10-CM

## 2017-08-20 DIAGNOSIS — R1013 Epigastric pain: Secondary | ICD-10-CM | POA: Insufficient documentation

## 2017-08-20 DIAGNOSIS — R42 Dizziness and giddiness: Secondary | ICD-10-CM

## 2017-08-20 DIAGNOSIS — I10 Essential (primary) hypertension: Secondary | ICD-10-CM | POA: Insufficient documentation

## 2017-08-20 DIAGNOSIS — E119 Type 2 diabetes mellitus without complications: Secondary | ICD-10-CM | POA: Insufficient documentation

## 2017-08-20 LAB — CBC WITH DIFFERENTIAL/PLATELET
Basophils Absolute: 0 10*3/uL (ref 0.0–0.1)
Basophils Relative: 0 %
Eosinophils Absolute: 0.2 10*3/uL (ref 0.0–0.7)
Eosinophils Relative: 3 %
HCT: 42.2 % (ref 39.0–52.0)
Hemoglobin: 14.1 g/dL (ref 13.0–17.0)
Lymphocytes Relative: 41 %
Lymphs Abs: 2.3 10*3/uL (ref 0.7–4.0)
MCH: 27.2 pg (ref 26.0–34.0)
MCHC: 33.4 g/dL (ref 30.0–36.0)
MCV: 81.5 fL (ref 78.0–100.0)
Monocytes Absolute: 0.4 10*3/uL (ref 0.1–1.0)
Monocytes Relative: 7 %
Neutro Abs: 2.9 10*3/uL (ref 1.7–7.7)
Neutrophils Relative %: 49 %
Platelets: 267 10*3/uL (ref 150–400)
RBC: 5.18 MIL/uL (ref 4.22–5.81)
RDW: 13.2 % (ref 11.5–15.5)
WBC: 5.7 10*3/uL (ref 4.0–10.5)

## 2017-08-20 LAB — COMPREHENSIVE METABOLIC PANEL
ALT: 29 U/L (ref 17–63)
AST: 29 U/L (ref 15–41)
Albumin: 4 g/dL (ref 3.5–5.0)
Alkaline Phosphatase: 71 U/L (ref 38–126)
Anion gap: 7 (ref 5–15)
BUN: 7 mg/dL (ref 6–20)
CO2: 27 mmol/L (ref 22–32)
Calcium: 9 mg/dL (ref 8.9–10.3)
Chloride: 102 mmol/L (ref 101–111)
Creatinine, Ser: 0.71 mg/dL (ref 0.61–1.24)
GFR calc Af Amer: >60 mL/min (ref >60)
GFR calc non Af Amer: >60 mL/min (ref >60)
Glucose, Bld: 101 mg/dL — ABNORMAL HIGH (ref 65–99)
Potassium: 3.5 mmol/L (ref 3.5–5.1)
Sodium: 136 mmol/L (ref 135–145)
Total Bilirubin: 0.5 mg/dL (ref 0.3–1.2)
Total Protein: 7.7 g/dL (ref 6.5–8.1)

## 2017-08-20 LAB — LIPASE, BLOOD: Lipase: 26 U/L (ref 11–51)

## 2017-08-20 MED ORDER — GI COCKTAIL ~~LOC~~
30.0000 mL | Freq: Once | ORAL | Status: AC
  Administered 2017-08-20: 30 mL via ORAL
  Filled 2017-08-20: qty 30

## 2017-08-20 MED ORDER — PROMETHAZINE HCL 25 MG PO TABS
25.0000 mg | ORAL_TABLET | Freq: Once | ORAL | Status: AC
Start: 2017-08-20 — End: 2017-08-20
  Administered 2017-08-20: 25 mg via ORAL
  Filled 2017-08-20: qty 1

## 2017-08-20 MED ORDER — PANTOPRAZOLE SODIUM 20 MG PO TBEC: 20.0000 mg | DELAYED_RELEASE_TABLET | Freq: Every day | ORAL | 0 refills | Status: DC

## 2017-08-20 MED ORDER — SUCRALFATE 1 G PO TABS: 1.0000 g | ORAL_TABLET | Freq: Three times a day (TID) | ORAL | 0 refills | Status: DC

## 2017-08-20 MED ORDER — PROMETHAZINE HCL 25 MG PO TABS: 25.0000 mg | ORAL_TABLET | Freq: Four times a day (QID) | ORAL | 0 refills | Status: DC | PRN

## 2017-08-20 NOTE — ED Triage Notes (Signed)
Pt c/o of nausea and dizziness ongoing for months. Denies any cp or sob. Pt is ambulatory in no distress.

## 2017-08-20 NOTE — Discharge Instructions (Signed)
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
## 2017-08-21 ENCOUNTER — Encounter (HOSPITAL_COMMUNITY): Payer: Self-pay | Admitting: Emergency Medicine

## 2017-08-21 ENCOUNTER — Ambulatory Visit (HOSPITAL_COMMUNITY)
Admission: EM | Admit: 2017-08-21 | Discharge: 2017-08-21 | Disposition: A | Discharge status: Home / Self Care | Payer: Self-pay | Attending: Internal Medicine | Admitting: Internal Medicine

## 2017-08-21 DIAGNOSIS — R42 Dizziness and giddiness: Secondary | ICD-10-CM

## 2017-08-21 NOTE — ED Triage Notes (Signed)
Pt here for dizziness; pt seen yesterday for same; pt with hx of anxiety and seen multiple times in past for same

## 2017-08-21 NOTE — ED Provider Notes (Signed)
[facility]    CSN: [id number] Arrival date & time: 08/21/17  1809     History   Chief Complaint Chief Complaint  Patient presents with  . Dizziness    HPI Johnnette LitterJose Thone is a 51 y.o. male presenting with dizziness. Dizziness sensation is feeling like he is about to pass out, but also a moving sensation, although denies room spinning. He has been experiencing dizziness for a while and been treated with meclizine PRN. States this helps a little bit. Also feels nausea. Went to Ed yesterday for nausea, abdominal pain. Gi cocktail in ED helped relieve symptoms. Sent home with phenergan, Protonix, and carafate. Patient did not pick these prescriptions up. Requesting Gi cocktail prescription. Also reporting a pulsating sensation in ear. Concerned his lorazepam is causing dizziness. Also endorsing head/neck pain, which he was seen here 5 days ago for.    HPI  Past Medical History:  Diagnosis Date  . Alcohol abuse   . Anxiety   . Anxiety   . Chronic neck pain   . Cocaine abuse (HCC)   . Diabetes mellitus without complication (HCC)   . Hypertension     Patient Active Problem List   Diagnosis Date Noted  . 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 Prediabetes 04/23/2017  . Muscle spasm 04/23/2017  . Essential hypertension 04/16/2017  . H/O medication noncompliance 04/02/2017  . Vitamin D deficiency 04/02/2017  . DJD (degenerative joint disease) of cervical spine 03/16/2017  . 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 Dizziness 01/23/2017  . Anxiety 11/21/2016    Past Surgical History:  Procedure Laterality Date  . EYE SURGERY Right ~ 1980   "hit my eye"       Home Medications    Prior to Admission medications   Medication Sig Start Date End Date Taking? Authorizing Provider  amLODipine (NORVASC) 5 MG tablet Take 1.5 tablets (7.5 mg total) by mouth daily. 07/03/17   Marcine MatarJohnson, Deborah B, MD  cyclobenzaprine (FLEXERIL) 10 MG tablet Take 1 tablet (10 mg total)  by mouth 2 (two) times daily as needed for muscle spasms. 08/16/17   Multani, Bhupinder, NP  LORazepam (ATIVAN) 0.5 MG tablet Take 1 tablet (0.5 mg total) by mouth 2 (two) times daily. for anxiety 08/05/17   Elvina SidleLauenstein, Kurt, MD  pantoprazole (PROTONIX) 20 MG tablet Take 1 tablet (20 mg total) by mouth daily. 08/20/17 09/19/17  Long, Arlyss RepressJoshua G, MD  promethazine (PHENERGAN) 25 MG tablet Take 1 tablet (25 mg total) by mouth every 6 (six) hours as needed for nausea or vomiting. 08/20/17   Long, Arlyss RepressJoshua G, MD  sucralfate (CARAFATE) 1 g tablet Take 1 tablet (1 g total) by mouth 4 (four) times daily -  with meals and at bedtime. 08/20/17 09/19/17  Long, Arlyss RepressJoshua G, MD    Family History Family History  Problem Relation Age of Onset  . Hypertension Mother   . Hypertension Father     Social History Social History   Tobacco Use  . Smoking status: Never Smoker  . Smokeless tobacco: Never Used  Substance Use Topics  . Alcohol use: No    Comment: former  . Drug use: No    Comment: denies, reports past history      Allergies   Motrin [ibuprofen]; Other; and Zofran [ondansetron hcl]   Review of Systems Review of Systems  Constitutional: Negative for chills and fever.  HENT: Negative for ear pain and sore throat.   Eyes: Negative for pain and visual  disturbance.  Respiratory: Negative for cough and shortness of breath.   Cardiovascular: Negative for chest pain and palpitations.  Gastrointestinal: Positive for nausea. Negative for abdominal pain and vomiting.  Musculoskeletal: Negative for arthralgias and back pain.  Skin: Negative for color change and rash.  Neurological: Positive for dizziness, light-headedness and headaches. Negative for syncope.  All other systems reviewed and are negative.    Physical Exam Triage Vital Signs ED Triage Vitals [08/21/17 1817]  Enc Vitals Group     BP (!) 128/95     Pulse Rate 90     Resp 20     Temp 97.7 F (36.5 C)     Temp Source Oral     SpO2 100  %     Weight      Height      Head Circumference      Peak Flow      Pain Score      Pain Loc      Pain Edu?      Excl. in GC?    No data found.  Updated Vital Signs BP (!) 128/95 (BP Location: Right Arm)   Pulse 90   Temp 97.7 F (36.5 C) (Oral)   Resp 20   SpO2 100%    Physical Exam  Constitutional: He appears well-developed and well-nourished.  HENT:  Head: Normocephalic and atraumatic.  Right Ear: Hearing, tympanic membrane and ear canal normal.  Left Ear: Hearing, tympanic membrane and ear canal normal.  Mouth/Throat: Uvula is midline, oropharynx is clear and moist and mucous membranes are normal. No trismus in the jaw. No uvula swelling.  Eyes: Conjunctivae and EOM are normal. Pupils are equal, round, and reactive to light.  Neck: Normal range of motion. Neck supple.  Cardiovascular: Normal rate and regular rhythm.  No murmur heard. Pulmonary/Chest: Effort normal and breath sounds normal. No respiratory distress.  Abdominal: Soft. There is no tenderness.  Musculoskeletal: He exhibits no edema.  Neurological: He is alert.  Skin: Skin is warm and dry.  Psychiatric: He has a normal mood and affect.  Nursing note and vitals reviewed.    UC Treatments / Results  Labs (all labs ordered are listed, but only abnormal results are displayed) Labs Reviewed - No data to display  EKG  EKG Interpretation None       Radiology No results found.  Procedures Procedures (including critical care time)  Medications Ordered in UC Medications - No data to display   Initial Impression / Assessment and Plan / UC Course  I have reviewed the triage vital signs and the nursing notes.  Pertinent labs & imaging results that were available during my care of the patient were reviewed by me and considered in my medical decision making (see chart for details).     Patient with multiple symptoms. Advised to pick up prescriptions prescribed by ED provider yesterday. Stop  meclizine temporarily and see if new symptoms control nausea/dizziness. Nausea relieved yesterday with Gi cocktail, may try OTC maalox for relief. Tylenol for Head/neck pain.  Discussed return precautions to include fever, chest pain, shortness of breath, failure to improve. Patient verbalized understanding and is agreeable with plan.   Final Clinical Impressions(s) / UC Diagnoses   Final diagnoses:  Dizziness    ED Discharge Orders    None       Controlled Substance Prescriptions [place] Controlled Substance Registry consulted? Not Applicable   Lew DawesWieters, Martiza Speth C, [place] 08/21/17 1922

## 2017-08-21 NOTE — Discharge Instructions (Signed)
Please pick up Maalox over the counter, this may help with reducing acid and nausea.  Please pick up Phenergan prescribed to you yesterday. Stop meclizine while taking phenergan. If meclizine better controlling nausea/dizziness, stop phenergan and resume meclizine.  Please take Tylenol for headache/neck pain.

## 2017-08-22 ENCOUNTER — Ambulatory Visit: Payer: Self-pay | Attending: Internal Medicine | Admitting: Pharmacist

## 2017-08-22 VITALS — BP 110/78 | HR 80

## 2017-08-22 DIAGNOSIS — Z79899 Other long term (current) drug therapy: Secondary | ICD-10-CM | POA: Insufficient documentation

## 2017-08-22 DIAGNOSIS — I10 Essential (primary) hypertension: Secondary | ICD-10-CM

## 2017-08-22 MED FILL — AMLODIPINE BESYLATE 5 MG TA: 5 | 30 days supply | Qty: 45 | Fill #1

## 2017-08-22 NOTE — Progress Notes (Signed)
   S:    Patient arrives in good spirits.    Presents to the clinic for blood pressure check.  Patient reports adherence with medications.   O:   Last 3 Office BP readings: BP Readings from Last 3 Encounters:  08/22/17 110/78  08/21/17 (!) 128/95  08/20/17 116/89    BMET    Component Value Date/Time   NA 136 08/20/2017 1144   K 3.5 08/20/2017 1144   CL 102 08/20/2017 1144   CO2 27 08/20/2017 1144   GLUCOSE 101 (H) 08/20/2017 1144   BUN 7 08/20/2017 1144   CREATININE 0.71 08/20/2017 1144   CALCIUM 9.0 08/20/2017 1144   GFRNONAA >60 08/20/2017 1144   GFRAA >60 08/20/2017 1144    A/P: Hypertension longstanding currently controlled on current medications. Continue current medications.   Results reviewed and written information provided.   Total time in face-to-face counseling 5 minutes.   F/U Clinic Visit with Dr. Laural BenesJohnson.

## 2017-08-23 ENCOUNTER — Ambulatory Visit: Payer: Self-pay | Admitting: Internal Medicine

## 2017-08-24 ENCOUNTER — Ambulatory Visit (HOSPITAL_BASED_OUTPATIENT_CLINIC_OR_DEPARTMENT_OTHER): Payer: Self-pay | Admitting: Internal Medicine

## 2017-08-24 ENCOUNTER — Encounter: Payer: Self-pay | Admitting: Internal Medicine

## 2017-08-24 VITALS — BP 117/76 | HR 88 | Temp 98.2°F | Resp 18 | Ht 68.0 in | Wt 193.0 lb

## 2017-08-24 DIAGNOSIS — Z5321 Procedure and treatment not carried out due to patient leaving prior to being seen by health care provider: Secondary | ICD-10-CM

## 2017-08-25 ENCOUNTER — Ambulatory Visit (HOSPITAL_COMMUNITY)
Admission: EM | Admit: 2017-08-25 | Discharge: 2017-08-25 | Disposition: A | Discharge status: Home / Self Care | Payer: Self-pay | Attending: Family Medicine | Admitting: Family Medicine

## 2017-08-25 ENCOUNTER — Encounter (HOSPITAL_COMMUNITY): Payer: Self-pay | Admitting: Emergency Medicine

## 2017-08-25 DIAGNOSIS — M62838 Other muscle spasm: Secondary | ICD-10-CM

## 2017-08-25 MED ORDER — METHOCARBAMOL 500 MG PO TABS: 500.0000 mg | ORAL_TABLET | Freq: Two times a day (BID) | ORAL | 0 refills | Status: DC

## 2017-08-25 NOTE — ED Notes (Signed)
Called x 2. No answer 

## 2017-08-25 NOTE — ED Triage Notes (Signed)
PT C/O: reports his BP was elevated about 30 min ago ... sts he went to a pharmacy to check his BP and it was 165/106  ONSET: today   SX ALSO INCLUDE: blurred vision, dizziness  TAKING MEDS: Amlodipine   A&O x4... NAD... Ambulatory

## 2017-08-26 ENCOUNTER — Encounter (HOSPITAL_COMMUNITY): Payer: Self-pay | Admitting: *Deleted

## 2017-08-26 ENCOUNTER — Emergency Department (HOSPITAL_COMMUNITY)
Admission: EM | Admit: 2017-08-26 | Discharge: 2017-08-26 | Disposition: A | Discharge status: Home / Self Care | Payer: Self-pay | Attending: Emergency Medicine | Admitting: Emergency Medicine

## 2017-08-26 ENCOUNTER — Other Ambulatory Visit: Payer: Self-pay

## 2017-08-26 ENCOUNTER — Emergency Department (HOSPITAL_COMMUNITY): Payer: Self-pay

## 2017-08-26 DIAGNOSIS — Z79899 Other long term (current) drug therapy: Secondary | ICD-10-CM | POA: Insufficient documentation

## 2017-08-26 DIAGNOSIS — E119 Type 2 diabetes mellitus without complications: Secondary | ICD-10-CM | POA: Insufficient documentation

## 2017-08-26 DIAGNOSIS — R0600 Dyspnea, unspecified: Secondary | ICD-10-CM | POA: Insufficient documentation

## 2017-08-26 DIAGNOSIS — I10 Essential (primary) hypertension: Secondary | ICD-10-CM | POA: Insufficient documentation

## 2017-08-26 LAB — CBC
HCT: 42.7 % (ref 39.0–52.0)
Hemoglobin: 14 g/dL (ref 13.0–17.0)
MCH: 26.9 pg (ref 26.0–34.0)
MCHC: 32.8 g/dL (ref 30.0–36.0)
MCV: 82 fL (ref 78.0–100.0)
Platelets: 274 10*3/uL (ref 150–400)
RBC: 5.21 MIL/uL (ref 4.22–5.81)
RDW: 13.8 % (ref 11.5–15.5)
WBC: 9.5 10*3/uL (ref 4.0–10.5)

## 2017-08-26 LAB — BASIC METABOLIC PANEL
Anion gap: 10 (ref 5–15)
BUN: 10 mg/dL (ref 6–20)
CO2: 23 mmol/L (ref 22–32)
Calcium: 8.9 mg/dL (ref 8.9–10.3)
Chloride: 101 mmol/L (ref 101–111)
Creatinine, Ser: 0.77 mg/dL (ref 0.61–1.24)
GFR calc Af Amer: >60 mL/min (ref >60)
GFR calc non Af Amer: >60 mL/min (ref >60)
Glucose, Bld: 113 mg/dL — ABNORMAL HIGH (ref 65–99)
Potassium: 3.4 mmol/L — ABNORMAL LOW (ref 3.5–5.1)
Sodium: 134 mmol/L — ABNORMAL LOW (ref 135–145)

## 2017-08-26 LAB — TROPONIN I: Troponin I: <0.03 ng/mL (ref <0.03)

## 2017-08-26 NOTE — ED Triage Notes (Signed)
The pt is c/o sob today  No chest pain some dizziness also he has had the dizziness and he thinks that the robaxin is making him more dizzy

## 2017-08-26 NOTE — ED Notes (Signed)
Declined W/C at D/C and was escorted to lobby by RN. 

## 2017-08-26 NOTE — ED Provider Notes (Signed)
[facility]   CSN: [id number] Arrival date & time: 08/26/17  1631     History   Chief Complaint Chief Complaint  Patient presents with  . Shortness of Breath    HPI Wesley [name] is a 51 y.o. male.  HPI 51 year old man history of alcohol abuse, cocaine abuse, and anxiety who presents today stating that he became somewhat dyspneic after taking Robaxin approximately 40 minutes prior to presenting to the ED.  He denies any rash, difficulty swallowing, lightheadedness, or other signs or symptoms of allergic reaction.  He has not had any coughing or history of PE.  He denies any chest pain, nausea, vomiting, or chest pain. Past Medical History:  Diagnosis Date  . Alcohol abuse   . Anxiety   . Anxiety   . Chronic neck pain   . Cocaine abuse (HCC)   . Diabetes mellitus without complication (HCC)   . Hypertension     Patient Active Problem List   Diagnosis Date Noted  . 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 Prediabetes 04/23/2017  . Muscle spasm 04/23/2017  . Essential hypertension 04/16/2017  . H/O medication noncompliance 04/02/2017  . Vitamin D deficiency 04/02/2017  . DJD (degenerative joint disease) of cervical spine 03/16/2017  . 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 Dizziness 01/23/2017  . Anxiety 11/21/2016    Past Surgical History:  Procedure Laterality Date  . EYE SURGERY Right ~ 1980   "hit my eye"       Home Medications    Prior to Admission medications   Medication Sig Start Date End Date Taking? Authorizing Provider  amLODipine (NORVASC) 5 MG tablet Take 1.5 tablets (7.5 mg total) by mouth daily. 07/03/17   Marcine MatarJohnson, Deborah B, MD  cyclobenzaprine (FLEXERIL) 10 MG tablet Take 1 tablet (10 mg total) by mouth 2 (two) times daily as needed for muscle spasms. 08/16/17   Multani, Bhupinder, NP  LORazepam (ATIVAN) 0.5 MG tablet Take 1 tablet (0.5 mg total) by mouth 2 (two) times daily. for anxiety  08/05/17   Elvina SidleLauenstein, Kurt, MD  methocarbamol (ROBAXIN) 500 MG tablet Take 1 tablet (500 mg total) by mouth 2 (two) times daily. 08/25/17   Mardella LaymanHagler, Brian, MD  pantoprazole (PROTONIX) 20 MG tablet Take 1 tablet (20 mg total) by mouth daily. 08/20/17 09/19/17  Long, Arlyss RepressJoshua G, MD  promethazine (PHENERGAN) 25 MG tablet Take 1 tablet (25 mg total) by mouth every 6 (six) hours as needed for nausea or vomiting. 08/20/17   Long, Arlyss RepressJoshua G, MD  sucralfate (CARAFATE) 1 g tablet Take 1 tablet (1 g total) by mouth 4 (four) times daily -  with meals and at bedtime. 08/20/17 09/19/17  Long, Arlyss RepressJoshua G, MD    Family History Family History  Problem Relation Age of Onset  . Hypertension Mother   . Hypertension Father     Social History Social History   Tobacco Use  . Smoking status: Never Smoker  . Smokeless tobacco: Never Used  Substance Use Topics  . Alcohol use: No    Comment: former  . Drug use: No    Comment: denies, reports past history      Allergies   Motrin [ibuprofen]; Other; and Zofran [ondansetron hcl]   Review of Systems Review of Systems  Constitutional: Negative.   HENT: Negative.   Eyes: Negative.   Respiratory: Positive for shortness of breath.   Cardiovascular: Negative.  Negative for chest pain and leg swelling.  Gastrointestinal:  Negative.   Endocrine: Negative.   Genitourinary: Negative.   Musculoskeletal: Negative.   Skin: Negative.   Neurological: Negative.   Hematological: Negative.   Psychiatric/Behavioral: Negative.   All other systems reviewed and are negative.    Physical Exam Updated Vital Signs BP 122/90 (BP Location: Right Arm)   Pulse 77   Temp 98.6 F (37 C) (Oral)   Resp 18   Ht 1.727 m (5\' 8" )   Wt 87.5 kg (193 lb)   SpO2 100%   BMI 29.35 kg/m   Physical Exam  Constitutional: He is oriented to person, place, and time. He appears well-developed and well-nourished.  HENT:  Head: Normocephalic.  Mouth/Throat: Oropharynx is clear and moist.    Eyes: Pupils are equal, round, and reactive to light.  Neck: Normal range of motion.  Cardiovascular: Normal rate and regular rhythm.  Pulmonary/Chest: Effort normal and breath sounds normal. No accessory muscle usage. No tachypnea. No respiratory distress.  Abdominal: Soft.  Musculoskeletal:       Right lower leg: Normal.       Left lower leg: Normal.  Neurological: He is alert and oriented to person, place, and time.  Skin: Skin is warm. Capillary refill takes less than 2 seconds.  Psychiatric: He has a normal mood and affect.  Nursing note and vitals reviewed.    ED Treatments / Results  Labs (all labs ordered are listed, but only abnormal results are displayed) Labs Reviewed  BASIC METABOLIC PANEL - Abnormal; Notable for the following components:      Result Value   Sodium 134 (*)    Potassium 3.4 (*)    Glucose, Bld 113 (*)    All other components within normal limits  CBC  TROPONIN I    EKG  EKG Interpretation None       Radiology Dg Chest 2 View  Result Date: 08/26/2017 CLINICAL DATA:  High blood pressure, dizziness. EXAM: CHEST  2 VIEW COMPARISON:  05/07/2017 FINDINGS: Cardiomediastinal silhouette enlarged. Mediastinal contours appear intact. There is no evidence of focal airspace consolidation, pleural effusion or pneumothorax. Low lung volumes. Crowding of the interstitial markings. Osseous structures are without acute abnormality. Soft tissues are grossly normal. IMPRESSION: Low lung volumes with crowding of the interstitial markings which may be due to pulmonary vascular congestion or simply hypoinflation. Mildly enlarged cardiac silhouette. Electronically Signed   By: Ted Mcalpineobrinka  Dimitrova M.D.   On: 08/26/2017 17:12    Procedures Procedures (including critical care time)  Medications Ordered in ED Medications - No data to display   Initial Impression / Assessment and Plan / ED Course  I have reviewed the triage vital signs and the nursing  notes.  Pertinent labs & imaging results that were available during my care of the patient were reviewed by me and considered in my medical decision making (see chart for details).       Final Clinical Impressions(s) / ED Diagnoses   Final diagnoses:  Dyspnea, unspecified type    ED Discharge Orders    None       Margarita Grizzleay, Danielle, MD 08/26/17 [phone]

## 2017-08-26 NOTE — Discharge Instructions (Signed)
Please stop robaxin Your vital signs and oxygen level are normal.  Recheck with your doctor this week.

## 2017-08-28 ENCOUNTER — Ambulatory Visit: Payer: Self-pay | Admitting: Internal Medicine

## 2017-08-29 ENCOUNTER — Ambulatory Visit (HOSPITAL_COMMUNITY)
Admission: EM | Admit: 2017-08-29 | Discharge: 2017-08-29 | Disposition: A | Discharge status: Home / Self Care | Payer: Self-pay | Attending: Family Medicine | Admitting: Family Medicine

## 2017-08-29 ENCOUNTER — Encounter (HOSPITAL_COMMUNITY): Payer: Self-pay | Admitting: Emergency Medicine

## 2017-08-29 DIAGNOSIS — F411 Generalized anxiety disorder: Secondary | ICD-10-CM

## 2017-08-29 MED ORDER — FLUOXETINE HCL 10 MG PO TABS: 10.0000 mg | ORAL_TABLET | Freq: Every day | ORAL | 1 refills | Status: DC

## 2017-08-29 NOTE — Progress Notes (Signed)
Left without being seen by PCP.

## 2017-08-29 NOTE — Discharge Instructions (Signed)
It was good to see you again Wesley [name]. As we have discussed in the past, I believe a lot of the symptoms you are feeling are related to anxiety. Start taking the medicine I have prescribed every morning and return to see me in 2-3 weeks. I am usually here on Wednesdays.

## 2017-08-29 NOTE — ED Triage Notes (Signed)
Pt states "I feel dizzy". No neuro deficits noted. Ambultory with steady gait.

## 2017-08-29 NOTE — ED Provider Notes (Signed)
[facility]   [id number] 08/25/17 Arrival Time: 1722  ASSESSMENT & PLAN:  1. Muscle spasm     Meds ordered this encounter  Medications  . methocarbamol (ROBAXIN) 500 MG tablet    Sig: Take 1 tablet (500 mg total) by mouth 2 (two) times daily.    Dispense:  20 tablet    Refill:  0   Reports he has used Robaxin in the past with good results. Ensure ROM as tolerated. Will f/u if not showing improvement over the next several days.  Reviewed expectations re: course of current medical issues. Questions answered. Outlined signs and symptoms indicating need for more acute intervention. Patient verbalized understanding. After Visit Summary given.   SUBJECTIVE: History from: patient. Wesley [name] is a 51 y.o. male who presents with complaint of intermittent "pain in my neck." Reports gradual onset several days ago. No new injury/trauma reported. Describes pain as "stiffness." H/O muscle spasms of neck and reports this feels the same. No HA. No extremity sensation changes or weakness. No OTC treatment.  ROS: As per HPI.   OBJECTIVE:  Vitals:   08/25/17 1742  BP: 137/82  Pulse: 91  Resp: 18  Temp: 98 F (36.7 C)  TempSrc: Oral  SpO2: 100%    General appearance: alert; no distress Eyes: PERRLA; EOMI; conjunctiva normal HENT: normocephalic; atraumatic Neck: supple with tenderness reported over musculature; no midline tenderness; FROM Lungs: clear to auscultation bilaterally Heart: regular rate and rhythm Extremities: no cyanosis or edema; symmetrical with no gross deformities Skin: warm and dry Neurologic: normal symmetric reflexes and strength of upper extremities Psychological: alert and cooperative; normal mood and affect   Allergies  Allergen Reactions  . Motrin [Ibuprofen] Other (See Comments)    Makes his throat get "very dry" after taking it  . Other Other (See Comments)    Steroids: Patient [facility] ([facility])  [phone] and it resulted in his muscles twitching all over his body  . Zofran [Ondansetron Hcl] Other (See Comments)    Causes sweating and body "feels badly"    Past Medical History:  Diagnosis Date  . Alcohol abuse   . Anxiety   . Anxiety   . Chronic neck pain   . Cocaine abuse (HCC)   . Diabetes mellitus without complication (HCC)   . Hypertension    Social History   Socioeconomic History  . Marital status: Significant Other    Spouse name: Not on file  . Number of children: Not on file  . Years of education: Not on file  . Highest education level: Not on file  Social Needs  . Financial resource strain: Not on file  . Food insecurity - worry: Not on file  . Food insecurity - inability: Not on file  . Transportation needs - medical: Not on file  . Transportation needs - non-medical: Not on file  Occupational History  . Not on file  Tobacco Use  . Smoking status: Never Smoker  . Smokeless tobacco: Never Used  Substance and Sexual Activity  . Alcohol use: No    Comment: former  . Drug use: No    Comment: denies, reports past history   . Sexual activity: Not on file  Other Topics Concern  . Not on file  Social History Narrative  . Not on file   Family History  Problem Relation Age of Onset  . Hypertension Mother   . Hypertension Father    Past Surgical History:  Procedure Laterality Date  . EYE SURGERY Right ~ 1980   "hit my eye"     Mardella LaymanHagler, Brian, MD 08/29/17 1332

## 2017-08-30 ENCOUNTER — Encounter (HOSPITAL_COMMUNITY): Payer: Self-pay | Admitting: Emergency Medicine

## 2017-08-30 ENCOUNTER — Emergency Department (HOSPITAL_COMMUNITY)
Admission: EM | Admit: 2017-08-30 | Discharge: 2017-08-30 | Disposition: A | Discharge status: Home / Self Care | Payer: Self-pay | Attending: Emergency Medicine | Admitting: Emergency Medicine

## 2017-08-30 ENCOUNTER — Other Ambulatory Visit: Payer: Self-pay

## 2017-08-30 DIAGNOSIS — F419 Anxiety disorder, unspecified: Secondary | ICD-10-CM | POA: Insufficient documentation

## 2017-08-30 DIAGNOSIS — M542 Cervicalgia: Secondary | ICD-10-CM | POA: Insufficient documentation

## 2017-08-30 DIAGNOSIS — I10 Essential (primary) hypertension: Secondary | ICD-10-CM | POA: Insufficient documentation

## 2017-08-30 DIAGNOSIS — Z79899 Other long term (current) drug therapy: Secondary | ICD-10-CM | POA: Insufficient documentation

## 2017-08-30 DIAGNOSIS — E119 Type 2 diabetes mellitus without complications: Secondary | ICD-10-CM | POA: Insufficient documentation

## 2017-08-30 DIAGNOSIS — R42 Dizziness and giddiness: Secondary | ICD-10-CM | POA: Insufficient documentation

## 2017-08-30 LAB — URINALYSIS, ROUTINE W REFLEX MICROSCOPIC
Bilirubin Urine: NEGATIVE
Glucose, UA: NEGATIVE mg/dL
Hgb urine dipstick: NEGATIVE
Ketones, ur: NEGATIVE mg/dL
Leukocytes, UA: NEGATIVE
Nitrite: NEGATIVE
Protein, ur: NEGATIVE mg/dL
Specific Gravity, Urine: 1.002 — ABNORMAL LOW (ref 1.005–1.030)
pH: 7 (ref 5.0–8.0)

## 2017-08-30 LAB — CBC
HCT: 42.3 % (ref 39.0–52.0)
Hemoglobin: 13.9 g/dL (ref 13.0–17.0)
MCH: 26.9 pg (ref 26.0–34.0)
MCHC: 32.9 g/dL (ref 30.0–36.0)
MCV: 82 fL (ref 78.0–100.0)
Platelets: 277 10*3/uL (ref 150–400)
RBC: 5.16 MIL/uL (ref 4.22–5.81)
RDW: 13.7 % (ref 11.5–15.5)
WBC: 7 10*3/uL (ref 4.0–10.5)

## 2017-08-30 LAB — BASIC METABOLIC PANEL
Anion gap: 6 (ref 5–15)
BUN: 9 mg/dL (ref 6–20)
CO2: 28 mmol/L (ref 22–32)
Calcium: 9 mg/dL (ref 8.9–10.3)
Chloride: 103 mmol/L (ref 101–111)
Creatinine, Ser: 0.77 mg/dL (ref 0.61–1.24)
GFR calc Af Amer: >60 mL/min (ref >60)
GFR calc non Af Amer: >60 mL/min (ref >60)
Glucose, Bld: 93 mg/dL (ref 65–99)
Potassium: 3.9 mmol/L (ref 3.5–5.1)
Sodium: 137 mmol/L (ref 135–145)

## 2017-08-30 LAB — CBG MONITORING, ED: Glucose-Capillary: 88 mg/dL (ref 65–99)

## 2017-08-30 MED ORDER — LORAZEPAM 1 MG PO TABS
2.0000 mg | ORAL_TABLET | Freq: Once | ORAL | Status: AC
  Administered 2017-08-30: 2 mg via ORAL
  Filled 2017-08-30: qty 2

## 2017-08-30 MED ORDER — DIAZEPAM 5 MG PO TABS: 5.0000 mg | ORAL_TABLET | Freq: Once | ORAL | Status: DC

## 2017-08-30 NOTE — ED Provider Notes (Signed)
[facility]-EMERGENCY DEPT Provider Note   CSN: [id number] Arrival date & time: 08/30/17  1445     History   Chief Complaint Chief Complaint  Patient presents with  . Dizziness    HPI Wesley [name] is a 51 y.o. male.  51 year old male presents with acute onset of dizziness and posterior neck pain.  Denies any history of trauma to his neck.  No radicular symptoms.  Dizziness is worse with certain movements.  Denies any headache or vomiting.  He denies any ataxia.  States that he feels that his blood pressures been elevated and took at home and it was in the 140s 150s.  He also felt more anxious as well and he took his antianxiety medications as well as blood pressure medication.  He does feel somewhat better at this time.  Denies any vertiginous symptoms.  No fever or chills or rashes.      Past Medical History:  Diagnosis Date  . Alcohol abuse   . Anxiety   . Anxiety   . Chronic neck pain   . Cocaine abuse (HCC)   . Diabetes mellitus without complication (HCC)   . Hypertension     Patient Active Problem List   Diagnosis Date Noted  . 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 Prediabetes 04/23/2017  . Muscle spasm 04/23/2017  . Essential hypertension 04/16/2017  . H/O medication noncompliance 04/02/2017  . Vitamin D deficiency 04/02/2017  . DJD (degenerative joint disease) of cervical spine 03/16/2017  . 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 Dizziness 01/23/2017  . Anxiety 11/21/2016    Past Surgical History:  Procedure Laterality Date  . EYE SURGERY Right ~ 1980   "hit my eye"       Home Medications    Prior to Admission medications   Medication Sig Start Date End Date Taking? Authorizing Provider  amLODipine (NORVASC) 5 MG tablet Take 1.5 tablets (7.5 mg total) by mouth daily. 07/03/17   Marcine MatarJohnson, Deborah B, MD  cyclobenzaprine (FLEXERIL) 10 MG tablet Take 1 tablet (10 mg total) by mouth 2 (two) times daily as needed for muscle  spasms. 08/16/17   Multani, Bhupinder, NP  FLUoxetine (PROZAC) 10 MG tablet Take 1 tablet (10 mg total) by mouth daily. 08/29/17   Mardella LaymanHagler, Brian, MD  LORazepam (ATIVAN) 0.5 MG tablet Take 1 tablet (0.5 mg total) by mouth 2 (two) times daily. for anxiety 08/05/17   Elvina SidleLauenstein, Kurt, MD  methocarbamol (ROBAXIN) 500 MG tablet Take 1 tablet (500 mg total) by mouth 2 (two) times daily. 08/25/17   Mardella LaymanHagler, Brian, MD  pantoprazole (PROTONIX) 20 MG tablet Take 1 tablet (20 mg total) by mouth daily. 08/20/17 09/19/17  Long, Arlyss RepressJoshua G, MD  promethazine (PHENERGAN) 25 MG tablet Take 1 tablet (25 mg total) by mouth every 6 (six) hours as needed for nausea or vomiting. 08/20/17   Long, Arlyss RepressJoshua G, MD  sucralfate (CARAFATE) 1 g tablet Take 1 tablet (1 g total) by mouth 4 (four) times daily -  with meals and at bedtime. 08/20/17 09/19/17  Long, Arlyss RepressJoshua G, MD    Family History Family History  Problem Relation Age of Onset  . Hypertension Mother   . Hypertension Father     Social History Social History   Tobacco Use  . Smoking status: Never Smoker  . Smokeless tobacco: Never Used  Substance Use Topics  . Alcohol use: No    Comment: former  . Drug use: No    Comment: denies,  reports past history      Allergies   Motrin [ibuprofen]; Other; and Zofran [ondansetron hcl]   Review of Systems Review of Systems  All other systems reviewed and are negative.    Physical Exam Updated Vital Signs BP (!) 143/99 (BP Location: Left Arm)   Pulse 87   Temp 98.3 F (36.8 C) (Oral)   Resp 18   SpO2 100%   Physical Exam  Constitutional: He is oriented to person, place, and time. He appears well-developed and well-nourished.  Non-toxic appearance. No distress.  HENT:  Head: Normocephalic and atraumatic.  Eyes: Conjunctivae, EOM and lids are normal. Pupils are equal, round, and reactive to light.  Neck: Normal range of motion. Neck supple. No tracheal deviation present. No thyroid mass present.    Cardiovascular: Normal rate, regular rhythm and normal heart sounds. Exam reveals no gallop.  No murmur heard. Pulmonary/Chest: Effort normal and breath sounds normal. No stridor. No respiratory distress. He has no decreased breath sounds. He has no wheezes. He has no rhonchi. He has no rales.  Abdominal: Soft. Normal appearance and bowel sounds are normal. He exhibits no distension. There is no tenderness. There is no rebound and no CVA tenderness.  Musculoskeletal: Normal range of motion. He exhibits no edema or tenderness.  Neurological: He is alert and oriented to person, place, and time. He has normal strength. He displays no tremor. No cranial nerve deficit or sensory deficit. He displays a negative Romberg sign. Coordination normal. GCS eye subscore is 4. GCS verbal subscore is 5. GCS motor subscore is 6.  Skin: Skin is warm and dry. No abrasion and no rash noted.  Psychiatric: His speech is normal and behavior is normal. His mood appears anxious.  Nursing note and vitals reviewed.    ED Treatments / Results  Labs (all labs ordered are listed, but only abnormal results are displayed) Labs Reviewed  BASIC METABOLIC PANEL  CBC  URINALYSIS, ROUTINE W REFLEX MICROSCOPIC  CBG MONITORING, ED    EKG  EKG Interpretation  Date/Time:  Thursday August 30 2017 14:56:00 EST Ventricular Rate:  87 PR Interval:    QRS Duration: 100 QT Interval:  361 QTC Calculation: 435 R Axis:   -64 Text Interpretation:  Sinus rhythm Left anterior fascicular block Abnormal R-wave progression, late transition Confirmed by Lorre NickAllen, Anthony ([id number]) on 08/30/2017 5:03:22 PM       Radiology No results found.  Procedures Procedures (including critical care time)  Medications Ordered in ED Medications - No data to display   Initial Impression / Assessment and Plan / ED Course  I have reviewed the triage vital signs and the nursing notes.  Pertinent labs & imaging results that were available  during my care of the patient were reviewed by me and considered in my medical decision making (see chart for details).    Pt given ativan here and his neck burning feels better. Suspect hs tachycardia is from this.  He has no focal neurological deficits.  Will be discharged with return precautions    Final Clinical Impressions(s) / ED Diagnoses   Final diagnoses:  None    ED Discharge Orders    None       Lorre NickAllen, Anthony, MD 08/30/17 [phone]

## 2017-08-30 NOTE — ED Triage Notes (Signed)
Pt verbalizes dizziness onset 2 hours ago. Pt continues to verbalize feeling "hot on the back the head; now it hurts." Pt denies fall/injury.

## 2017-09-02 ENCOUNTER — Ambulatory Visit (HOSPITAL_COMMUNITY)
Admission: EM | Admit: 2017-09-02 | Discharge: 2017-09-02 | Disposition: A | Discharge status: Home / Self Care | Payer: Self-pay

## 2017-09-03 ENCOUNTER — Emergency Department (HOSPITAL_COMMUNITY)
Admission: EM | Admit: 2017-09-03 | Discharge: 2017-09-03 | Disposition: A | Discharge status: Home / Self Care | Payer: Self-pay | Attending: Emergency Medicine | Admitting: Emergency Medicine

## 2017-09-03 ENCOUNTER — Encounter (HOSPITAL_COMMUNITY): Payer: Self-pay | Admitting: Neurology

## 2017-09-03 ENCOUNTER — Other Ambulatory Visit: Payer: Self-pay

## 2017-09-03 ENCOUNTER — Encounter (HOSPITAL_COMMUNITY): Payer: Self-pay

## 2017-09-03 ENCOUNTER — Emergency Department (HOSPITAL_COMMUNITY): Payer: Self-pay

## 2017-09-03 DIAGNOSIS — Z79899 Other long term (current) drug therapy: Secondary | ICD-10-CM | POA: Insufficient documentation

## 2017-09-03 DIAGNOSIS — R42 Dizziness and giddiness: Secondary | ICD-10-CM | POA: Insufficient documentation

## 2017-09-03 DIAGNOSIS — E119 Type 2 diabetes mellitus without complications: Secondary | ICD-10-CM | POA: Insufficient documentation

## 2017-09-03 DIAGNOSIS — Z5321 Procedure and treatment not carried out due to patient leaving prior to being seen by health care provider: Secondary | ICD-10-CM | POA: Insufficient documentation

## 2017-09-03 DIAGNOSIS — I10 Essential (primary) hypertension: Secondary | ICD-10-CM | POA: Insufficient documentation

## 2017-09-03 DIAGNOSIS — R110 Nausea: Secondary | ICD-10-CM | POA: Insufficient documentation

## 2017-09-03 LAB — TROPONIN I: Troponin I: <0.03 ng/mL (ref <0.03)

## 2017-09-03 LAB — LIPASE, BLOOD: Lipase: 28 U/L (ref 11–51)

## 2017-09-03 LAB — CBG MONITORING, ED: Glucose-Capillary: 106 mg/dL — ABNORMAL HIGH (ref 65–99)

## 2017-09-03 LAB — COMPREHENSIVE METABOLIC PANEL
ALT: 26 U/L (ref 17–63)
AST: 24 U/L (ref 15–41)
Albumin: 4.3 g/dL (ref 3.5–5.0)
Alkaline Phosphatase: 76 U/L (ref 38–126)
Anion gap: 9 (ref 5–15)
BUN: 10 mg/dL (ref 6–20)
CO2: 26 mmol/L (ref 22–32)
Calcium: 9.7 mg/dL (ref 8.9–10.3)
Chloride: 102 mmol/L (ref 101–111)
Creatinine, Ser: 0.67 mg/dL (ref 0.61–1.24)
GFR calc Af Amer: >60 mL/min (ref >60)
GFR calc non Af Amer: >60 mL/min (ref >60)
Glucose, Bld: 92 mg/dL (ref 65–99)
Potassium: 3.7 mmol/L (ref 3.5–5.1)
Sodium: 137 mmol/L (ref 135–145)
Total Bilirubin: 0.8 mg/dL (ref 0.3–1.2)
Total Protein: 7.9 g/dL (ref 6.5–8.1)

## 2017-09-03 LAB — CBC
HCT: 42.8 % (ref 39.0–52.0)
Hemoglobin: 14.6 g/dL (ref 13.0–17.0)
MCH: 27.6 pg (ref 26.0–34.0)
MCHC: 34.1 g/dL (ref 30.0–36.0)
MCV: 80.9 fL (ref 78.0–100.0)
Platelets: 282 10*3/uL (ref 150–400)
RBC: 5.29 MIL/uL (ref 4.22–5.81)
RDW: 13.6 % (ref 11.5–15.5)
WBC: 7.2 10*3/uL (ref 4.0–10.5)

## 2017-09-03 MED ORDER — MECLIZINE HCL 25 MG PO TABS
25.0000 mg | ORAL_TABLET | Freq: Once | ORAL | Status: AC
  Administered 2017-09-03: 25 mg via ORAL
  Filled 2017-09-03: qty 1

## 2017-09-03 MED ORDER — DIAZEPAM 5 MG PO TABS
5.0000 mg | ORAL_TABLET | Freq: Once | ORAL | Status: DC
  Filled 2017-09-03: qty 1

## 2017-09-03 MED ORDER — MECLIZINE HCL 25 MG PO TABS: 25.0000 mg | ORAL_TABLET | Freq: Three times a day (TID) | ORAL | 0 refills | Status: DC | PRN

## 2017-09-03 MED ORDER — LORAZEPAM 1 MG PO TABS
1.0000 mg | ORAL_TABLET | Freq: Once | ORAL | Status: AC
  Administered 2017-09-03: 1 mg via ORAL
  Filled 2017-09-03: qty 1

## 2017-09-03 NOTE — ED Notes (Signed)
Called pt no answer °

## 2017-09-03 NOTE — ED Provider Notes (Signed)
[facility]-EMERGENCY DEPT Provider Note   CSN: [id number] Arrival date & time: 09/03/17  1141     History   Chief Complaint Chief Complaint  Patient presents with  . Shortness of Breath    HPI Wesley [name] is a 51 y.o. male.  HPI   51 year old male with past medical history as below including chronic dizziness, anxiety, and alcohol abuse, who presents with dizziness.  The patient has been seen multiple times in ED for this.  He has had negative CT imaging in the past.  He states that he gets intermittent ringing in his left ear with associated aural fullness.  He then becomes dizzy and feels like he is moving when he is not.  He has had some mild nausea but no vomiting with this.  Denies any other changes in his health.  He has chronic shortness of breath that is not acutely worsened.  His symptoms worsen when he moves his head and seemed to improve when he is staying still.  Denies any headache.  No neck pain or neck stiffness.  No difficulty swallowing or speaking.  Past Medical History:  Diagnosis Date  . Alcohol abuse   . Anxiety   . Anxiety   . Chronic neck pain   . Cocaine abuse (HCC)   . Diabetes mellitus without complication (HCC)   . Hypertension     Patient Active Problem List   Diagnosis Date Noted  . 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 Prediabetes 04/23/2017  . Muscle spasm 04/23/2017  . Essential hypertension 04/16/2017  . H/O medication noncompliance 04/02/2017  . Vitamin D deficiency 04/02/2017  . DJD (degenerative joint disease) of cervical spine 03/16/2017  . 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 Dizziness 01/23/2017  . Anxiety 11/21/2016    Past Surgical History:  Procedure Laterality Date  . EYE SURGERY Right ~ 1980   "hit my eye"       Home Medications    Prior to Admission medications   Medication Sig Start Date End Date Taking? Authorizing Provider  amLODipine (NORVASC) 5 MG tablet Take 1.5 tablets  (7.5 mg total) by mouth daily. 07/03/17  Yes Marcine MatarJohnson, Deborah B, MD  cyclobenzaprine (FLEXERIL) 10 MG tablet Take 1 tablet (10 mg total) by mouth 2 (two) times daily as needed for muscle spasms. 08/16/17  Yes Multani, Bhupinder, NP  LORazepam (ATIVAN) 0.5 MG tablet Take 1 tablet (0.5 mg total) by mouth 2 (two) times daily. for anxiety 08/05/17  Yes Elvina SidleLauenstein, Kurt, MD  meclizine (ANTIVERT) 25 MG tablet Take 25 mg by mouth daily as needed for dizziness. 07/10/17  Yes [provider]  methocarbamol (ROBAXIN) 500 MG tablet Take 1 tablet (500 mg total) by mouth 2 (two) times daily. 08/25/17  Yes Mardella LaymanHagler, Brian, MD  promethazine (PHENERGAN) 25 MG tablet Take 1 tablet (25 mg total) by mouth every 6 (six) hours as needed for nausea or vomiting. 08/20/17  Yes Long, Arlyss RepressJoshua G, MD  FLUoxetine (PROZAC) 10 MG tablet Take 1 tablet (10 mg total) by mouth daily. Patient not taking: Reported on 08/30/2017 08/29/17   Mardella LaymanHagler, Brian, MD  pantoprazole (PROTONIX) 20 MG tablet Take 1 tablet (20 mg total) by mouth daily. Patient not taking: Reported on 08/30/2017 08/20/17 09/19/17  Long, Arlyss RepressJoshua G, MD  sucralfate (CARAFATE) 1 g tablet Take 1 tablet (1 g total) by mouth 4 (four) times daily -  with meals and at bedtime. Patient not taking: Reported on 08/30/2017 08/20/17 09/19/17  Long, Arlyss Repress, MD    Family History Family History  Problem Relation Age of Onset  . Hypertension Mother   . Hypertension Father     Social History Social History   Tobacco Use  . Smoking status: Never Smoker  . Smokeless tobacco: Never Used  Substance Use Topics  . Alcohol use: No    Comment: former  . Drug use: No    Comment: denies, reports past history      Allergies   Ibuprofen; Other; and Zofran [ondansetron hcl]   Review of Systems Review of Systems  Constitutional: Positive for fatigue.  Neurological: Positive for dizziness and light-headedness.  All other systems reviewed and are negative.    Physical  Exam Updated Vital Signs BP (!) 140/100   Pulse 97   Temp 98 F (36.7 C) (Oral)   Resp 16   SpO2 98%   Physical Exam  Constitutional: He is oriented to person, place, and time. He appears well-developed and well-nourished. No distress.  HENT:  Head: Normocephalic and atraumatic.  Left serous effusion.  No mastoid erythema.  Tympanic membrane is bulging but not erythematous.  External auditory canals normal.  Normal right TM.  Eyes: Conjunctivae are normal. Pupils are equal, round, and reactive to light.  Neck: Neck supple.  Cardiovascular: Normal rate, regular rhythm and normal heart sounds. Exam reveals no friction rub.  No murmur heard. Pulmonary/Chest: Effort normal and breath sounds normal. No respiratory distress. He has no wheezes. He has no rales.  Abdominal: He exhibits no distension.  Musculoskeletal: He exhibits no edema.  Neurological: He is alert and oriented to person, place, and time. He exhibits normal muscle tone.  Skin: Skin is warm. Capillary refill takes less than 2 seconds.  Psychiatric: He has a normal mood and affect.  Nursing note and vitals reviewed.   Neurological Exam:  Mental Status: Alert and oriented to person, place, and time. Attention and concentration normal. Speech clear. Recent memory is intact. Cranial Nerves: Visual fields grossly intact. EOMI and PERRLA. No nystagmus noted. Facial sensation intact at forehead, maxillary cheek, and chin/mandible bilaterally. No facial asymmetry or weakness. Hearing grossly normal. Uvula is midline, and palate elevates symmetrically. Normal SCM and trapezius strength. Tongue midline without fasciculations. Motor: Muscle strength 5/5 in proximal and distal UE and LE bilaterally. No pronator drift. Muscle tone normal. Reflexes: 2+ and symmetrical in all four extremities.  Sensation: Intact to light touch in upper and lower extremities distally bilaterally.  Gait: Normal without ataxia. Coordination: Normal FTN  bilaterally.    ED Treatments / Results  Labs (all labs ordered are listed, but only abnormal results are displayed) Labs Reviewed  CBC  TROPONIN I  COMPREHENSIVE METABOLIC PANEL  LIPASE, BLOOD  CBG MONITORING, ED    EKG  EKG Interpretation  Date/Time:  Monday September 03 2017 13:06:12 EST Ventricular Rate:  97 PR Interval:    QRS Duration: 97 QT Interval:  346 QTC Calculation: 440 R Axis:   -63 Text Interpretation:  Sinus rhythm Left anterior fascicular block Abnormal R-wave progression, late transition No significant change since last tracing Confirmed by Shaune Pollack [phone]) on 09/03/2017 7:04:49 PM       Radiology Dg Chest 2 View  Result Date: 09/03/2017 CLINICAL DATA:  Shortness of breath. EXAM: CHEST  2 VIEW COMPARISON:  08/26/2017 FINDINGS: The heart size is stable and within normal limits. There is stable prominence and tortuosity of the thoracic aorta. Low bilateral lung volumes with bibasilar atelectasis. The visualized skeletal  structures are unremarkable. IMPRESSION: Low lung volumes with bibasilar atelectasis. Electronically Signed   By: Irish LackGlenn  Yamagata M.D.   On: 09/03/2017 18:05    Procedures Procedures (including critical care time)  Medications Ordered in ED Medications  meclizine (ANTIVERT) tablet 25 mg (25 mg Oral Given 09/03/17 1923)  LORazepam (ATIVAN) tablet 1 mg (1 mg Oral Given 09/03/17 1923)     Initial Impression / Assessment and Plan / ED Course  I have reviewed the triage vital signs and the nursing notes.  Pertinent labs & imaging results that were available during my care of the patient were reviewed by me and considered in my medical decision making (see chart for details).     51 year old male with past medical history as above here with dizziness.  This is been an ongoing issue since his TBI last year.  He has had negative imaging in the past.  He has no focal neurologic deficits to suggest posterior stroke or circulation  abnormality at this time.  He appears otherwise well.  Lab work is unremarkable.  EKG is at baseline.  I given him meclizine and Valium with improvement in his symptoms.  I suspect he likely has a component of likely peripheral vertigo secondary to intermittent ear dysfunction versus vertigo secondary to his TBI.  Given repeated negative imaging for the same complaint with no focal deficits and reassuring hints exam, I do not feel the patient needs repeat imaging.  Will give meclizine at home and outpatient follow-up.  Final Clinical Impressions(s) / ED Diagnoses   Final diagnoses:  Dizziness    ED Discharge Orders    None       Shaune PollackIsaacs, Cameron, MD 09/03/17 [phone]

## 2017-09-03 NOTE — ED Triage Notes (Signed)
Pt reports nausea started at 0430 this morning, no vomiting. Takes ativan in the morning, is wondering if it could be causing his nausea. C/o abdominal pain, 9/10.

## 2017-09-03 NOTE — ED Triage Notes (Signed)
He c/o shortness of breath since this morning. He is in no distress. [facility] and had left there A.M.A. He ambulates without difficulty.

## 2017-09-03 NOTE — ED Notes (Signed)
Called pt in waiting area- no answer

## 2017-09-07 ENCOUNTER — Encounter (HOSPITAL_COMMUNITY): Payer: Self-pay | Admitting: Emergency Medicine

## 2017-09-07 ENCOUNTER — Ambulatory Visit (HOSPITAL_COMMUNITY)
Admission: EM | Admit: 2017-09-07 | Discharge: 2017-09-07 | Disposition: A | Discharge status: Home / Self Care | Payer: Self-pay | Attending: Internal Medicine | Admitting: Internal Medicine

## 2017-09-07 DIAGNOSIS — M542 Cervicalgia: Secondary | ICD-10-CM

## 2017-09-07 DIAGNOSIS — Z131 Encounter for screening for diabetes mellitus: Secondary | ICD-10-CM

## 2017-09-07 LAB — GLUCOSE, CAPILLARY: Glucose-Capillary: 78 mg/dL (ref 65–99)

## 2017-09-07 MED ORDER — MELOXICAM 7.5 MG PO TABS
7.5000 mg | ORAL_TABLET | Freq: Every day | ORAL | 0 refills | Status: DC
Start: 2017-09-07 — End: 2017-10-14

## 2017-09-07 NOTE — Discharge Instructions (Signed)
Please take meloxicam daily, take with food. This should help with neck pain.  Heat application may be helpful Please follow up with your primary care doctor for reevaluation of this in the next 1-2 weeks.

## 2017-09-07 NOTE — ED Triage Notes (Signed)
PT reports neck pain for 2 hours. PT also requests a glucose check.

## 2017-09-07 NOTE — ED Provider Notes (Signed)
[facility]    CSN: [id number] Arrival date & time: 09/07/17  1603     History   Chief Complaint Chief Complaint  Patient presents with  . Neck Pain    HPI Wesley [name] is a 51 y.o. male.   Micahel presents with complaints of neck pain which worsened over the past few hours. Rates pain 9/10. No injury. Movement worsens the pain. Without weakness, numbness or tingling to upper extremities. Without headache. Without any trigger to increase pain. Has not tried any treatments or taken any medications for this. States he has taken muscle relaxers in the past but these make him dizzy. Per chart ibuprofen makes his mouth dry therefore is listed as an intolerance. Has been seen for this multiple times in the past, CT of neck was complete 02/2017, see impression copied below. Does have a PCP, states he has an appointment next week. Requests his blood sugar to be checked, does not have diabetes.   ROS per HPI.     IMPRESSION: 1. No fracture or subluxation. 2. Stable reversal of the normal cervical lordosis. 3. Mild of moderate multilevel degenerative changes. No significant canal or foraminal stenosis.   Electronically Signed   By: Beckie Salts M.D.   On: 03/09/2017 09:03  Past Medical History:  Diagnosis Date  . Alcohol abuse   . Anxiety   . Anxiety   . Chronic neck pain   . Cocaine abuse (HCC)   . Diabetes mellitus without complication (HCC)   . Hypertension     Patient Active Problem List   Diagnosis Date Noted  . Legally blind in right eye, as defined in [place] 05/02/2017  . Prediabetes 04/23/2017  . Muscle spasm 04/23/2017  . Essential hypertension 04/16/2017  . H/O medication noncompliance 04/02/2017  . Vitamin D deficiency 04/02/2017  . DJD (degenerative joint disease) of cervical spine 03/16/2017  . 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 Dizziness 01/23/2017  . Anxiety 11/21/2016    Past Surgical History:  Procedure Laterality Date  . EYE SURGERY Right ~ 1980     "hit my eye"       Home Medications    Prior to Admission medications   Medication Sig Start Date End Date Taking? Authorizing Provider  amLODipine (NORVASC) 5 MG tablet Take 1.5 tablets (7.5 mg total) by mouth daily. 07/03/17   Marcine Matar, MD  FLUoxetine (PROZAC) 10 MG tablet Take 1 tablet (10 mg total) by mouth daily. Patient not taking: Reported on 08/30/2017 08/29/17   Mardella Layman, MD  LORazepam (ATIVAN) 0.5 MG tablet Take 1 tablet (0.5 mg total) by mouth 2 (two) times daily. for anxiety 08/05/17   Elvina Sidle, MD  meclizine (ANTIVERT) 25 MG tablet Take 1 tablet (25 mg total) by mouth 3 (three) times daily as needed for dizziness. 09/03/17   Shaune Pollack, MD  meloxicam (MOBIC) 7.5 MG tablet Take 1 tablet (7.5 mg total) by mouth daily. 09/07/17   Georgetta Haber, NP  pantoprazole (PROTONIX) 20 MG tablet Take 1 tablet (20 mg total) by mouth daily. Patient not taking: Reported on 08/30/2017 08/20/17 09/19/17  Long, Arlyss Repress, MD  promethazine (PHENERGAN) 25 MG tablet Take 1 tablet (25 mg total) by mouth every 6 (six) hours as needed for nausea or vomiting. 08/20/17   Long, Arlyss Repress, MD  sucralfate (CARAFATE) 1 g tablet Take 1 tablet (1 g total) by mouth 4 (four) times daily -  with meals and at bedtime. Patient not taking: Reported on 08/30/2017 08/20/17  09/19/17  Long, Arlyss RepressJoshua G, MD    Family History Family History  Problem Relation Age of Onset  . Hypertension Mother   . Hypertension Father     Social History Social History   Tobacco Use  . Smoking status: Never Smoker  . Smokeless tobacco: Never Used  Substance Use Topics  . Alcohol use: No    Comment: former  . Drug use: No    Comment: denies, reports past history      Allergies   Ibuprofen; Other; and Zofran [ondansetron hcl]   Review of Systems Review of Systems   Physical Exam Triage Vital Signs ED Triage Vitals [09/07/17 1614]  Enc Vitals Group     BP (!) 149/95     Pulse Rate 88      Resp 16     Temp 97.7 F (36.5 C)     Temp Source Oral     SpO2 100 %     Weight 187 lb (84.8 kg)     Height      Head Circumference      Peak Flow      Pain Score 9     Pain Loc      Pain Edu?      Excl. in GC?    No data found.  Updated Vital Signs BP (!) 149/95   Pulse 88   Temp 97.7 F (36.5 C) (Oral)   Resp 16   Wt 187 lb (84.8 kg)   SpO2 100%   BMI 28.43 kg/m   Visual Acuity Right Eye Distance:   Left Eye Distance:   Bilateral Distance:    Right Eye Near:   Left Eye Near:    Bilateral Near:     Physical Exam  Constitutional: He is oriented to person, place, and time. He appears well-developed and well-nourished.  Cardiovascular: Normal rate and regular rhythm.  Pulmonary/Chest: Effort normal and breath sounds normal.  Musculoskeletal:       Cervical back: He exhibits tenderness. He exhibits normal range of motion, no bony tenderness, no swelling, no edema, no deformity and no laceration.  Trapezius muscle pain, r>l; reproducible with palpation; full neck and arm ROM without difficulty; sensation intact, strength equal bilaterally to upper extremities  Neurological: He is alert and oriented to person, place, and time. He displays normal reflexes. No cranial nerve deficit or sensory deficit. Coordination normal.  Skin: Skin is warm and dry.     UC Treatments / Results  Labs (all labs ordered are listed, but only abnormal results are displayed) Labs Reviewed - No data to display  EKG  EKG Interpretation None       Radiology No results found.  Procedures Procedures (including critical care time)  Medications Ordered in UC Medications - No data to display   Initial Impression / Assessment and Plan / UC Course  I have reviewed the triage vital signs and the nursing notes.  Pertinent labs & imaging results that were available during my care of the patient were reviewed by me and considered in my medical decision making (see chart for  details).     Ibuprofen caused dry mouth in the past, will try daily mobic and see if this is helpful for neck pain. Heat may be helpful. If symptoms worsen or do not improve in the next week to return to be seen or to follow up with PCP.    Final Clinical Impressions(s) / UC Diagnoses   Final diagnoses:  Neck pain  ED Discharge Orders        Ordered    meloxicam (MOBIC) 7.5 MG tablet  Daily     09/07/17 1630       Controlled Substance Prescriptions [place] Controlled Substance Registry consulted? Not Applicable   Georgetta HaberBurky, Natalie B, NP 09/07/17 [phone]

## 2017-09-08 NOTE — ED Provider Notes (Signed)
[facility]   [id number] 08/29/17 Arrival Time: 1725  ASSESSMENT & PLAN:  1. Generalized anxiety disorder     Meds ordered this encounter  Medications  . FLUoxetine (PROZAC) 10 MG tablet    Sig: Take 1 tablet (10 mg total) by mouth daily.    Dispense:  30 tablet    Refill:  1   He agrees to f/u in 2-3 weeks and to continue Prozac until then unless he's experiencing side effects.  Reviewed expectations re: course of current medical issues. Questions answered. Outlined signs and symptoms indicating need for more acute intervention. Patient verbalized understanding. After Visit Summary given.   SUBJECTIVE:  Wesley [name] is a 51 y.o. male who continues to present with anxiety and associated symptoms that vary. Earlier today reports "feeling dizzy for a few seconds". Resolved gradually and has not returned. Ambulatory. No new medications. Admits feeling very anxious over feeling. No HA or visual changes. No n/v. No dysequilibrium. No self treatment.  ROS: As per HPI.   OBJECTIVE:  Vitals:   08/29/17 1759 08/29/17 1817  BP: 139/87 120/78  Pulse: 100 95  Resp: 16 18  Temp: 98.4 F (36.9 C) 98.2 F (36.8 C)  TempSrc: Oral   SpO2: 100% 100%    General appearance: alert; no distress Eyes: PERRLA; EOMI; conjunctiva normal HENT: normocephalic; atraumatic Neck: supple Lungs: clear to auscultation bilaterally Heart: regular rate and rhythm Extremities: no cyanosis or edema; symmetrical with no gross deformities Skin: warm and dry Neurologic: normal gait; normal symmetric reflexes Psychological: alert and cooperative; normal mood and affect   Allergies  Allergen Reactions  . Ibuprofen Other (See Comments) and Rash    Makes his throat get "very dry" after taking it States he cant take it  . Other Other (See Comments)    Steroids: Patient received a steroid shot from [facility] [phone] and it resulted in his muscles twitching all over his  body  . Zofran [Ondansetron Hcl] Other (See Comments)    Causes sweating and body "feels badly"    Past Medical History:  Diagnosis Date  . Alcohol abuse   . Anxiety   . Anxiety   . Chronic neck pain   . Cocaine abuse (HCC)   . Diabetes mellitus without complication (HCC)   . Hypertension    Social History   Socioeconomic History  . Marital status: Significant Other    Spouse name: Not on file  . Number of children: Not on file  . Years of education: Not on file  . Highest education level: Not on file  Social Needs  . Financial resource strain: Not on file  . Food insecurity - worry: Not on file  . Food insecurity - inability: Not on file  . Transportation needs - medical: Not on file  . Transportation needs - non-medical: Not on file  Occupational History  . Not on file  Tobacco Use  . Smoking status: Never Smoker  . Smokeless tobacco: Never Used  Substance and Sexual Activity  . Alcohol use: No    Comment: former  . Drug use: No    Comment: denies, reports past history   . Sexual activity: Not on file  Other Topics Concern  . Not on file  Social History Narrative  . Not on file   Family History  Problem Relation Age of Onset  . Hypertension Mother   . Hypertension Father    Past Surgical History:  Procedure Laterality Date  .  EYE SURGERY Right ~ 1980   "hit my eye"      Mardella LaymanHagler, Brian, MD 09/08/17 1123

## 2017-09-13 ENCOUNTER — Ambulatory Visit: Payer: Self-pay | Admitting: Internal Medicine

## 2017-09-14 ENCOUNTER — Ambulatory Visit: Payer: Self-pay | Attending: Internal Medicine | Admitting: *Deleted

## 2017-09-14 VITALS — BP 121/83 | HR 82

## 2017-09-14 DIAGNOSIS — I10 Essential (primary) hypertension: Secondary | ICD-10-CM

## 2017-09-14 NOTE — Progress Notes (Signed)
[facility]. Pt alert and oriented.  Pt denies chest pain, SOB, HA, dizziness, or blurred vision.  Verified medication. Pt states some of his medication was taken today  Blood pressure reading: 121/83  Pt instructed to f/u with PCP on 09/18/17.

## 2017-09-18 ENCOUNTER — Ambulatory Visit: Payer: Self-pay | Admitting: Internal Medicine

## 2017-09-20 ENCOUNTER — Emergency Department (HOSPITAL_COMMUNITY)
Admission: EM | Admit: 2017-09-20 | Discharge: 2017-09-20 | Discharge status: Against Medical Advice | Payer: Self-pay | Attending: Emergency Medicine | Admitting: Emergency Medicine

## 2017-09-20 ENCOUNTER — Other Ambulatory Visit: Payer: Self-pay

## 2017-09-20 ENCOUNTER — Encounter (HOSPITAL_COMMUNITY): Payer: Self-pay | Admitting: Emergency Medicine

## 2017-09-20 DIAGNOSIS — Z5321 Procedure and treatment not carried out due to patient leaving prior to being seen by health care provider: Secondary | ICD-10-CM | POA: Insufficient documentation

## 2017-09-20 DIAGNOSIS — R109 Unspecified abdominal pain: Secondary | ICD-10-CM | POA: Insufficient documentation

## 2017-09-20 LAB — CBC
HCT: 43.3 % (ref 39.0–52.0)
Hemoglobin: 14.1 g/dL (ref 13.0–17.0)
MCH: 26.8 pg (ref 26.0–34.0)
MCHC: 32.6 g/dL (ref 30.0–36.0)
MCV: 82.3 fL (ref 78.0–100.0)
Platelets: 269 10*3/uL (ref 150–400)
RBC: 5.26 MIL/uL (ref 4.22–5.81)
RDW: 14 % (ref 11.5–15.5)
WBC: 7.2 10*3/uL (ref 4.0–10.5)

## 2017-09-20 LAB — COMPREHENSIVE METABOLIC PANEL
ALT: 29 U/L (ref 17–63)
AST: 25 U/L (ref 15–41)
Albumin: 4.3 g/dL (ref 3.5–5.0)
Alkaline Phosphatase: 83 U/L (ref 38–126)
Anion gap: 7 (ref 5–15)
BUN: 11 mg/dL (ref 6–20)
CO2: 27 mmol/L (ref 22–32)
Calcium: 9.3 mg/dL (ref 8.9–10.3)
Chloride: 103 mmol/L (ref 101–111)
Creatinine, Ser: 0.77 mg/dL (ref 0.61–1.24)
GFR calc Af Amer: >60 mL/min (ref >60)
GFR calc non Af Amer: >60 mL/min (ref >60)
Glucose, Bld: 98 mg/dL (ref 65–99)
Potassium: 3.8 mmol/L (ref 3.5–5.1)
Sodium: 137 mmol/L (ref 135–145)
Total Bilirubin: 0.6 mg/dL (ref 0.3–1.2)
Total Protein: 8.3 g/dL — ABNORMAL HIGH (ref 6.5–8.1)

## 2017-09-20 LAB — LIPASE, BLOOD: Lipase: 28 U/L (ref 11–51)

## 2017-09-20 NOTE — ED Triage Notes (Signed)
Pt complaint of generalized abdominal pain and body aches onset 0400 this morning. Pt denies n/v/d, GU symptoms, SOB, or CP.

## 2017-09-20 NOTE — ED Notes (Signed)
Called for Pt in lobby for vital recheck, no response X3. Do not see Pt in lobby.

## 2017-09-20 NOTE — ED Notes (Signed)
Called pt in lobby for vital recheck, no response X2.

## 2017-09-20 NOTE — ED Notes (Signed)
Called Pt in lobby to be roomed, no response X1.

## 2017-09-21 DIAGNOSIS — Z5321 Procedure and treatment not carried out due to patient leaving prior to being seen by health care provider: Secondary | ICD-10-CM | POA: Insufficient documentation

## 2017-09-21 DIAGNOSIS — R42 Dizziness and giddiness: Secondary | ICD-10-CM | POA: Insufficient documentation

## 2017-09-22 ENCOUNTER — Emergency Department (HOSPITAL_COMMUNITY)
Admission: EM | Admit: 2017-09-22 | Discharge: 2017-09-22 | Disposition: A | Discharge status: Home / Self Care | Payer: Self-pay | Attending: Emergency Medicine | Admitting: Emergency Medicine

## 2017-09-22 ENCOUNTER — Encounter (HOSPITAL_COMMUNITY): Payer: Self-pay | Admitting: Nurse Practitioner

## 2017-09-22 NOTE — ED Triage Notes (Signed)
Pt states he suspects he has a flu that is causing him to feel dizzy.

## 2017-09-23 ENCOUNTER — Encounter (HOSPITAL_COMMUNITY): Payer: Self-pay | Admitting: Emergency Medicine

## 2017-09-23 ENCOUNTER — Ambulatory Visit (HOSPITAL_COMMUNITY)
Admission: EM | Admit: 2017-09-23 | Discharge: 2017-09-23 | Disposition: A | Discharge status: Home / Self Care | Payer: Self-pay | Attending: Family Medicine | Admitting: Family Medicine

## 2017-09-23 DIAGNOSIS — G2581 Restless legs syndrome: Secondary | ICD-10-CM

## 2017-09-23 MED ORDER — ROPINIROLE HCL 1 MG PO TABS: 1.0000 mg | ORAL_TABLET | Freq: Every day | ORAL | 1 refills | Status: DC

## 2017-09-23 NOTE — ED Provider Notes (Signed)
[facility]   [id number] 09/23/17 Arrival Time: 1818   SUBJECTIVE:  Wesley [name] is a 52 y.o. male who presents to the urgent care with complaint of chronic intermittent vertigo, worse for last 3 days.  He works [employer] and is taking meclizine which helps.  He would like something for his twitching muscles, particularly at night.  Patient has had cardiac workup negative and would like an ENT referral.  Note from [id number]: 52 year old male with past medical history as below including chronic dizziness, anxiety, and alcohol abuse, who presents with dizziness.  The patient has been seen multiple times in ED for this.  He has had negative CT imaging in the past.  He states that he gets intermittent ringing in his left ear with associated aural fullness.  He then becomes dizzy and feels like he is moving when he is not.  He has had some mild nausea but no vomiting with this.  Denies any other changes in his health.  He has chronic shortness of breath that is not acutely worsened.  His symptoms worsen when he moves his head and seemed to improve when he is staying still.  Denies any headache.  No neck pain or neck stiffness.  No difficulty swallowing or speaking.     Past Medical History:  Diagnosis Date  . Alcohol abuse   . Anxiety   . Anxiety   . Chronic neck pain   . Cocaine abuse (HCC)   . Diabetes mellitus without complication (HCC)   . Hypertension    Family History  Problem Relation Age of Onset  . Hypertension Mother   . Hypertension Father    Social History   Socioeconomic History  . Marital status: Significant Other    Spouse name: Not on file  . Number of children: Not on file  . Years of education: Not on file  . Highest education level: Not on file  Social Needs  . Financial resource strain: Not on file  . Food insecurity - worry: Not on file  . Food insecurity - inability: Not on file  . Transportation needs - medical: Not on file  . Transportation  needs - non-medical: Not on file  Occupational History  . Not on file  Tobacco Use  . Smoking status: Never Smoker  . Smokeless tobacco: Never Used  Substance and Sexual Activity  . Alcohol use: No    Comment: former  . Drug use: No    Comment: denies, reports past history   . Sexual activity: Not on file  Other Topics Concern  . Not on file  Social History Narrative  . Not on file   Current Meds  Medication Sig  . amLODipine (NORVASC) 5 MG tablet Take 1.5 tablets (7.5 mg total) by mouth daily.  Marland Kitchen. LORazepam (ATIVAN) 0.5 MG tablet Take 1 tablet (0.5 mg total) by mouth 2 (two) times daily. for anxiety  . meclizine (ANTIVERT) 25 MG tablet Take 1 tablet (25 mg total) by mouth 3 (three) times daily as needed for dizziness.  . meloxicam (MOBIC) 7.5 MG tablet Take 1 tablet (7.5 mg total) by mouth daily.   Allergies  Allergen Reactions  . Ibuprofen Other (See Comments) and Rash    Makes his throat get "very dry" after taking it States he cant take it  . Other Other (See Comments)    Steroids: Patient received a steroid shot from [facility] [phone] and it resulted in his muscles twitching all over his  body  . Zofran [Ondansetron Hcl] Other (See Comments)    Causes sweating and body "feels badly"      ROS: As per HPI, remainder of ROS negative.   OBJECTIVE:   Vitals:   09/23/17 1857  BP: 117/81  Pulse: 81  Resp: 20  Temp: 98.2 F (36.8 C)  TempSrc: Oral  SpO2: 97%     General appearance: alert; no distress Eyes: PERRL; EOMI; conjunctiva normal HENT: normocephalic; atraumatic; TMs normal, canal normal, external ears normal without trauma; nasal mucosa normal; oral mucosa normal Neck: supple Lungs: clear to auscultation bilaterally Heart: regular rate and rhythm Back: no CVA tenderness Extremities: no cyanosis or edema; symmetrical with no gross deformities Skin: warm and dry Neurologic: normal gait; grossly normal Psychological: alert and  cooperative; normal mood and affect      Labs:  Results for orders placed or performed during the hospital encounter of 09/20/17  Lipase, blood  Result Value Ref Range   Lipase 28 11 - 51 U/L  Comprehensive metabolic panel  Result Value Ref Range   Sodium 137 135 - 145 mmol/L   Potassium 3.8 3.5 - 5.1 mmol/L   Chloride 103 101 - 111 mmol/L   CO2 27 22 - 32 mmol/L   Glucose, Bld 98 65 - 99 mg/dL   BUN 11 6 - 20 mg/dL   Creatinine, Ser 1.61 0.61 - 1.24 mg/dL   Calcium 9.3 8.9 - 09.6 mg/dL   Total Protein 8.3 (H) 6.5 - 8.1 g/dL   Albumin 4.3 3.5 - 5.0 g/dL   AST 25 15 - 41 U/L   ALT 29 17 - 63 U/L   Alkaline Phosphatase 83 38 - 126 U/L   Total Bilirubin 0.6 0.3 - 1.2 mg/dL   GFR calc non Af Amer >60 >60 mL/min   GFR calc Af Amer >60 >60 mL/min   Anion gap 7 5 - 15  CBC  Result Value Ref Range   WBC 7.2 4.0 - 10.5 K/uL   RBC 5.26 4.22 - 5.81 MIL/uL   Hemoglobin 14.1 13.0 - 17.0 g/dL   HCT 04.5 40.9 - 81.1 %   MCV 82.3 78.0 - 100.0 fL   MCH 26.8 26.0 - 34.0 pg   MCHC 32.6 30.0 - 36.0 g/dL   RDW 91.4 78.2 - 95.6 %   Platelets 269 150 - 400 K/uL    Labs Reviewed - No data to display  No results found.     ASSESSMENT & PLAN:  1. Restless legs syndrome (RLS)     Meds ordered this encounter  Medications  . rOPINIRole (REQUIP) 1 MG tablet    Sig: Take 1 tablet (1 mg total) by mouth at bedtime.    Dispense:  30 tablet    Refill:  1    Reviewed expectations re: course of current medical issues. Questions answered. Outlined signs and symptoms indicating need for more acute intervention. Patient verbalized understanding. After Visit Summary given.     Elvina Sidle, MD 09/23/17 [phone]

## 2017-09-23 NOTE — Discharge Instructions (Signed)
Contact Dr. Lazarus SalinesWolicki for help with dizziness.

## 2017-09-23 NOTE — ED Triage Notes (Signed)
PT C/O: pain at back of neck and dizziness that is intermittent.... Has been seen for the same sx in the past  DENIES: inj/trauma   TAKING MEDS: Meclizine   A&O x4... NAD... Ambulatory

## 2017-09-30 ENCOUNTER — Encounter (HOSPITAL_COMMUNITY): Payer: Self-pay | Admitting: Emergency Medicine

## 2017-09-30 ENCOUNTER — Emergency Department (HOSPITAL_COMMUNITY)
Admission: EM | Admit: 2017-09-30 | Discharge: 2017-09-30 | Discharge status: Against Medical Advice | Payer: Self-pay | Attending: Emergency Medicine | Admitting: Emergency Medicine

## 2017-09-30 ENCOUNTER — Emergency Department (HOSPITAL_COMMUNITY)
Admission: EM | Admit: 2017-09-30 | Discharge: 2017-10-01 | Disposition: A | Discharge status: Home / Self Care | Payer: Self-pay | Attending: Emergency Medicine | Admitting: Emergency Medicine

## 2017-09-30 ENCOUNTER — Other Ambulatory Visit: Payer: Self-pay

## 2017-09-30 ENCOUNTER — Ambulatory Visit (INDEPENDENT_AMBULATORY_CARE_PROVIDER_SITE_OTHER): Payer: Self-pay

## 2017-09-30 ENCOUNTER — Ambulatory Visit (HOSPITAL_COMMUNITY)
Admission: EM | Admit: 2017-09-30 | Discharge: 2017-09-30 | Disposition: A | Discharge status: Home / Self Care | Payer: Self-pay | Attending: Urgent Care | Admitting: Urgent Care

## 2017-09-30 DIAGNOSIS — M791 Myalgia, unspecified site: Secondary | ICD-10-CM | POA: Insufficient documentation

## 2017-09-30 DIAGNOSIS — Z5321 Procedure and treatment not carried out due to patient leaving prior to being seen by health care provider: Secondary | ICD-10-CM | POA: Insufficient documentation

## 2017-09-30 DIAGNOSIS — I10 Essential (primary) hypertension: Secondary | ICD-10-CM | POA: Insufficient documentation

## 2017-09-30 DIAGNOSIS — J209 Acute bronchitis, unspecified: Secondary | ICD-10-CM

## 2017-09-30 DIAGNOSIS — R05 Cough: Secondary | ICD-10-CM

## 2017-09-30 DIAGNOSIS — E119 Type 2 diabetes mellitus without complications: Secondary | ICD-10-CM | POA: Insufficient documentation

## 2017-09-30 DIAGNOSIS — R000 Tachycardia, unspecified: Secondary | ICD-10-CM

## 2017-09-30 DIAGNOSIS — R52 Pain, unspecified: Secondary | ICD-10-CM

## 2017-09-30 DIAGNOSIS — R059 Cough, unspecified: Secondary | ICD-10-CM

## 2017-09-30 DIAGNOSIS — B349 Viral infection, unspecified: Secondary | ICD-10-CM | POA: Insufficient documentation

## 2017-09-30 DIAGNOSIS — R509 Fever, unspecified: Secondary | ICD-10-CM | POA: Insufficient documentation

## 2017-09-30 DIAGNOSIS — R5381 Other malaise: Secondary | ICD-10-CM

## 2017-09-30 LAB — BASIC METABOLIC PANEL
Anion gap: 8 (ref 5–15)
BUN: 8 mg/dL (ref 6–20)
CO2: 25 mmol/L (ref 22–32)
Calcium: 8.8 mg/dL — ABNORMAL LOW (ref 8.9–10.3)
Chloride: 99 mmol/L — ABNORMAL LOW (ref 101–111)
Creatinine, Ser: 0.79 mg/dL (ref 0.61–1.24)
GFR calc Af Amer: >60 mL/min (ref >60)
GFR calc non Af Amer: >60 mL/min (ref >60)
Glucose, Bld: 107 mg/dL — ABNORMAL HIGH (ref 65–99)
Potassium: 3.5 mmol/L (ref 3.5–5.1)
Sodium: 132 mmol/L — ABNORMAL LOW (ref 135–145)

## 2017-09-30 LAB — CBC WITH DIFFERENTIAL/PLATELET
Basophils Absolute: 0 10*3/uL (ref 0.0–0.1)
Basophils Relative: 0 %
Eosinophils Absolute: 0.1 10*3/uL (ref 0.0–0.7)
Eosinophils Relative: 1 %
HCT: 40.2 % (ref 39.0–52.0)
Hemoglobin: 13.6 g/dL (ref 13.0–17.0)
Lymphocytes Relative: 11 %
Lymphs Abs: 0.9 10*3/uL (ref 0.7–4.0)
MCH: 27 pg (ref 26.0–34.0)
MCHC: 33.8 g/dL (ref 30.0–36.0)
MCV: 79.8 fL (ref 78.0–100.0)
Monocytes Absolute: 1.2 10*3/uL — ABNORMAL HIGH (ref 0.1–1.0)
Monocytes Relative: 15 %
Neutro Abs: 5.8 10*3/uL (ref 1.7–7.7)
Neutrophils Relative %: 73 %
Platelets: 273 10*3/uL (ref 150–400)
RBC: 5.04 MIL/uL (ref 4.22–5.81)
RDW: 13.8 % (ref 11.5–15.5)
WBC: 7.9 10*3/uL (ref 4.0–10.5)

## 2017-09-30 MED ORDER — AZITHROMYCIN 250 MG PO TABS: ORAL_TABLET | ORAL | 0 refills | Status: DC

## 2017-09-30 MED ORDER — ACETAMINOPHEN 500 MG PO TABS
1000.0000 mg | ORAL_TABLET | Freq: Once | ORAL | Status: AC
  Administered 2017-09-30: 1000 mg via ORAL
  Filled 2017-09-30: qty 2

## 2017-09-30 MED ORDER — SODIUM CHLORIDE 0.9 % IV BOLUS (SEPSIS)
1000.0000 mL | Freq: Once | INTRAVENOUS | Status: AC
  Administered 2017-09-30: 1000 mL via INTRAVENOUS

## 2017-09-30 MED ORDER — ACETAMINOPHEN-CODEINE 120-12 MG/5ML PO SUSP: 5.0000 mL | Freq: Every evening | ORAL | 0 refills | Status: DC | PRN

## 2017-09-30 MED ORDER — BENZONATATE 100 MG PO CAPS: 100.0000 mg | ORAL_CAPSULE | Freq: Three times a day (TID) | ORAL | 0 refills | Status: DC | PRN

## 2017-09-30 NOTE — Discharge Instructions (Addendum)
Tome 500mg de Tylenol cada 6 horas con comida para dolor y inflammacion. °

## 2017-09-30 NOTE — Discharge Instructions (Signed)
Blood work was good.  Increase fluids.  Tylenol for fever or pain.

## 2017-09-30 NOTE — ED Triage Notes (Signed)
Patient c/o cough and body aches since yesterday. [facility] and dx with bronchitis. Speaking in full sentences without difficulty.

## 2017-09-30 NOTE — ED Provider Notes (Signed)
MRN: [id number] DOB: 1966/04/30  Subjective:   Wesley [name] is a 52 y.o. male presenting for 1 day history of subjective fever, body aches, headache, productive cough, dizziness. Has taken APAP with minimal relief. Has not been hydrating well. Denies ear pain, sore throat, chest pain, shob, wheezing, n/v, abdominal pain, rashes. Reports allergy to ibuprofen, has shob, chest tightness. Denies smoking cigarettes or drinking alcohol. Denies history of allergies, asthma. [facility], advised supportive care for viral illness. Today, he is requesting injectable pain medications. He is also requesting amoxicillin for an infection.  No current facility-administered medications for this encounter.   Current Outpatient Medications:  .  amLODipine (NORVASC) 5 MG tablet, Take 1.5 tablets (7.5 mg total) by mouth daily., Disp: 45 tablet, Rfl: 2 .  FLUoxetine (PROZAC) 10 MG tablet, Take 1 tablet (10 mg total) by mouth daily., Disp: 30 tablet, Rfl: 1 .  LORazepam (ATIVAN) 0.5 MG tablet, Take 1 tablet (0.5 mg total) by mouth 2 (two) times daily. for anxiety, Disp: 60 tablet, Rfl: 1 .  meclizine (ANTIVERT) 25 MG tablet, Take 1 tablet (25 mg total) by mouth 3 (three) times daily as needed for dizziness., Disp: 30 tablet, Rfl: 0 .  meloxicam (MOBIC) 7.5 MG tablet, Take 1 tablet (7.5 mg total) by mouth daily., Disp: 30 tablet, Rfl: 0 .  pantoprazole (PROTONIX) 20 MG tablet, Take 1 tablet (20 mg total) by mouth daily., Disp: 30 tablet, Rfl: 0 .  promethazine (PHENERGAN) 25 MG tablet, Take 1 tablet (25 mg total) by mouth every 6 (six) hours as needed for nausea or vomiting. (Patient not taking: Reported on 09/14/2017), Disp: 30 tablet, Rfl: 0 .  rOPINIRole (REQUIP) 1 MG tablet, Take 1 tablet (1 mg total) by mouth at bedtime., Disp: 30 tablet, Rfl: 1 .  sucralfate (CARAFATE) 1 g tablet, Take 1 tablet (1 g total) by mouth 4 (four) times daily -  with meals and at bedtime., Disp: 120 tablet,  Rfl: 0   Wesley [name] is allergic to ibuprofen; other; and zofran [ondansetron hcl].  Wesley [name]  has a past medical history of Alcohol abuse, Anxiety, Anxiety, Chronic neck pain, Cocaine abuse (HCC), Diabetes mellitus without complication (HCC), and Hypertension. Also  has a past surgical history that includes Eye surgery (Right, ~ 1980). His family history includes Hypertension in his father and mother.   Objective:   Vitals: BP (!) 142/95   Pulse (!) 127   Temp 98.2 F (36.8 C)   Resp 18   SpO2 100%   Physical Exam  Constitutional: He is oriented to person, place, and time. He appears well-developed and well-nourished.  HENT:  TM's intact bilaterally, no effusions or erythema. Nasal turbinates erythematous, dry, nasal passages patent. No sinus tenderness. Oropharynx post-nasal drainage, mucous membranes moist.   Eyes: Right eye exhibits no discharge. Left eye exhibits no discharge.  Neck: Normal range of motion. Neck supple.  Cardiovascular: Normal rate, regular rhythm and intact distal pulses. Exam reveals no gallop and no friction rub.  No murmur heard. Pulmonary/Chest: No respiratory distress. He has no wheezes. He has no rales.  Lymphadenopathy:    He has no cervical adenopathy.  Neurological: He is alert and oriented to person, place, and time.  Skin: Skin is warm and dry.  Psychiatric:  Anxious demeanor.   Dg Chest 2 View  Result Date: 09/30/2017 CLINICAL DATA:  52 year old male with history of generalized body aches and coughing since yesterday evening. White sputum. Chills. EXAM: CHEST  2 VIEW COMPARISON:  Chest x-ray 09/03/2017. FINDINGS: Diffuse peribronchial cuffing. Lung volumes are low. No consolidative airspace disease. No pleural effusions. No pneumothorax. No pulmonary nodule or mass noted. Pulmonary vasculature and the cardiomediastinal silhouette are within normal limits. IMPRESSION: 1. Diffuse peribronchial cuffing, concerning for an acute bronchitis. Electronically Signed    By: Trudie Reed M.D.   On: 09/30/2017 14:32    Assessment and Plan :   Acute bronchitis, unspecified organism  Generalized body aches  Malaise  Tachycardia  Cough  Will cover for acute bronchitis with Azithromycin. Offered cough suppression medications. Counseled patient on potential for adverse effects with medications prescribed today, patient verbalized understanding.    Wallis Bamberg, PA-C 09/30/17 1525

## 2017-09-30 NOTE — ED Notes (Signed)
Patient called for room placement x1 with no answer. 

## 2017-09-30 NOTE — ED Triage Notes (Signed)
Patient c/o generalized body aches and cough since yesterday. [facility] and [facility] with [facility]. Speaking in full sentences without difficulty.

## 2017-09-30 NOTE — ED Triage Notes (Signed)
Pt c/o generalized body aches, coughing since last night

## 2017-09-30 NOTE — ED Provider Notes (Signed)
[facility]-EMERGENCY DEPT Provider Note   CSN: [id number] Arrival date & time: 09/30/17  1903     History   Chief Complaint Chief Complaint  Patient presents with  . Generalized Body Aches    HPI Wesley [name] is a 52 y.o. male.  Generalized body aches, fever, cough for 2 days.  Patient was seen in the urgent care center today and diagnosed with bronchitis.  He was prescribed Zithromax, Tessalon Perles, cough syrup.  Past medical history includes anxiety, cocaine, diabetes, hypertension, alcohol, multiple emergency visits.      Past Medical History:  Diagnosis Date  . Alcohol abuse   . Anxiety   . Anxiety   . Chronic neck pain   . Cocaine abuse (HCC)   . Diabetes mellitus without complication (HCC)   . Hypertension     Patient Active Problem List   Diagnosis Date Noted  . Legally blind in right eye, as defined in [place] 05/02/2017  . Prediabetes 04/23/2017  . Muscle spasm 04/23/2017  . Essential hypertension 04/16/2017  . H/O medication noncompliance 04/02/2017  . Vitamin D deficiency 04/02/2017  . DJD (degenerative joint disease) of cervical spine 03/16/2017  . 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 Dizziness 01/23/2017  . Anxiety 11/21/2016    Past Surgical History:  Procedure Laterality Date  . EYE SURGERY Right ~ 1980   "hit my eye"       Home Medications    Prior to Admission medications   Medication Sig Start Date End Date Taking? Authorizing Provider  acetaminophen-codeine 120-12 MG/5ML suspension Take 5 mLs by mouth at bedtime as needed (cough). 09/30/17   Wallis Bamberg, PA-C  amLODipine (NORVASC) 5 MG tablet Take 1.5 tablets (7.5 mg total) by mouth daily. 07/03/17   Marcine Matar, MD  azithromycin (ZITHROMAX) 250 MG tablet Start with 2 tablets today, then 1 daily thereafter. 09/30/17   Wallis Bamberg, PA-C  benzonatate (TESSALON) 100 MG capsule Take 1-2 capsules (100-200 mg total) by mouth 3 (three) times daily as needed for cough. 09/30/17    Wallis Bamberg, PA-C  FLUoxetine (PROZAC) 10 MG tablet Take 1 tablet (10 mg total) by mouth daily. 08/29/17   Mardella Layman, MD  LORazepam (ATIVAN) 0.5 MG tablet Take 1 tablet (0.5 mg total) by mouth 2 (two) times daily. for anxiety 08/05/17   Elvina Sidle, MD  meclizine (ANTIVERT) 25 MG tablet Take 1 tablet (25 mg total) by mouth 3 (three) times daily as needed for dizziness. 09/03/17   Shaune Pollack, MD  meloxicam (MOBIC) 7.5 MG tablet Take 1 tablet (7.5 mg total) by mouth daily. 09/07/17   Georgetta Haber, NP  pantoprazole (PROTONIX) 20 MG tablet Take 1 tablet (20 mg total) by mouth daily. 08/20/17 09/19/17  Long, Arlyss Repress, MD  promethazine (PHENERGAN) 25 MG tablet Take 1 tablet (25 mg total) by mouth every 6 (six) hours as needed for nausea or vomiting. Patient not taking: Reported on 09/14/2017 08/20/17   Long, Arlyss Repress, MD  rOPINIRole (REQUIP) 1 MG tablet Take 1 tablet (1 mg total) by mouth at bedtime. 09/23/17   Elvina Sidle, MD  sucralfate (CARAFATE) 1 g tablet Take 1 tablet (1 g total) by mouth 4 (four) times daily -  with meals and at bedtime. 08/20/17 09/19/17  Long, Arlyss Repress, MD    Family History Family History  Problem Relation Age of Onset  . Hypertension Mother   . Hypertension Father     Social History Social History   Tobacco Use  .  Smoking status: Never Smoker  . Smokeless tobacco: Never Used  Substance Use Topics  . Alcohol use: No    Comment: former  . Drug use: No    Comment: denies, reports past history      Allergies   Ibuprofen; Other; and Zofran [ondansetron hcl]   Review of Systems Review of Systems  All other systems reviewed and are negative.    Physical Exam Updated Vital Signs BP 135/87 (BP Location: Right Arm)   Pulse (!) 120   Temp (!) 101 F (38.3 C) (Oral)   Resp 19   SpO2 97%   Physical Exam  Constitutional: He is oriented to person, place, and time.  Dehydrated, no acute distress  HENT:  Head: Normocephalic and atraumatic.    Eyes: Conjunctivae are normal.  Neck: Neck supple.  No meningeal signs  Cardiovascular: Regular rhythm.  Tachycardic  Pulmonary/Chest: Effort normal and breath sounds normal.  Abdominal: Soft. Bowel sounds are normal.  Musculoskeletal: Normal range of motion.  Neurological: He is alert and oriented to person, place, and time.  Skin: Skin is warm and dry.  Psychiatric: He has a normal mood and affect. His behavior is normal.  Nursing note and vitals reviewed.    ED Treatments / Results  Labs (all labs ordered are listed, but only abnormal results are displayed) Labs Reviewed  CBC WITH DIFFERENTIAL/PLATELET - Abnormal; Notable for the following components:      Result Value   Monocytes Absolute 1.2 (*)    All other components within normal limits  BASIC METABOLIC PANEL - Abnormal; Notable for the following components:   Sodium 132 (*)    Chloride 99 (*)    Glucose, Bld 107 (*)    Calcium 8.8 (*)    All other components within normal limits    EKG  EKG Interpretation None       Radiology Dg Chest 2 View  Result Date: 09/30/2017 CLINICAL DATA:  52 year old male with history of generalized body aches and coughing since yesterday evening. White sputum. Chills. EXAM: CHEST  2 VIEW COMPARISON:  Chest x-ray 09/03/2017. FINDINGS: Diffuse peribronchial cuffing. Lung volumes are low. No consolidative airspace disease. No pleural effusions. No pneumothorax. No pulmonary nodule or mass noted. Pulmonary vasculature and the cardiomediastinal silhouette are within normal limits. IMPRESSION: 1. Diffuse peribronchial cuffing, concerning for an acute bronchitis. Electronically Signed   By: Trudie Reed M.D.   On: 09/30/2017 14:32    Procedures Procedures (including critical care time)  Medications Ordered in ED Medications  sodium chloride 0.9 % bolus 1,000 mL (1,000 mLs Intravenous New Bag/Given 09/30/17 2226)  sodium chloride 0.9 % bolus 1,000 mL (1,000 mLs Intravenous New  Bag/Given 09/30/17 2226)  acetaminophen (TYLENOL) tablet 1,000 mg (1,000 mg Oral Given 09/30/17 2229)     Initial Impression / Assessment and Plan / ED Course  I have reviewed the triage vital signs and the nursing notes.  Pertinent labs & imaging results that were available during my care of the patient were reviewed by me and considered in my medical decision making (see chart for details).     History and physical consistent with viral syndrome.  White count normal.  Glucose normal.  Chest x-ray suggests acute bronchitis (negative for pneumonia).   He feels better after 2 L of IV fluids.  No evidence of meningitis.  Final Clinical Impressions(s) / ED Diagnoses   Final diagnoses:  Viral syndrome    ED Discharge Orders    None  Donnetta Hutchingook, Brian, MD 09/30/17 [phone]

## 2017-09-30 NOTE — ED Notes (Signed)
Pt called x3, pt is no longer in lobby

## 2017-09-30 NOTE — ED Notes (Signed)
Pt called for room, checked in restroom and outside

## 2017-10-11 ENCOUNTER — Encounter (HOSPITAL_COMMUNITY): Payer: Self-pay | Admitting: Emergency Medicine

## 2017-10-11 ENCOUNTER — Ambulatory Visit (HOSPITAL_COMMUNITY)
Admission: EM | Admit: 2017-10-11 | Discharge: 2017-10-11 | Disposition: A | Discharge status: Home / Self Care | Payer: Self-pay | Attending: Family Medicine | Admitting: Family Medicine

## 2017-10-11 ENCOUNTER — Other Ambulatory Visit: Payer: Self-pay

## 2017-10-11 DIAGNOSIS — R52 Pain, unspecified: Secondary | ICD-10-CM

## 2017-10-11 DIAGNOSIS — R110 Nausea: Secondary | ICD-10-CM

## 2017-10-11 MED ORDER — PANTOPRAZOLE SODIUM 20 MG PO TBEC: 20.0000 mg | DELAYED_RELEASE_TABLET | Freq: Every day | ORAL | 0 refills | Status: DC

## 2017-10-11 MED ORDER — GI COCKTAIL ~~LOC~~
ORAL | Status: AC
  Filled 2017-10-11: qty 30

## 2017-10-11 MED ORDER — PROMETHAZINE HCL 25 MG PO TABS: 25.0000 mg | ORAL_TABLET | Freq: Three times a day (TID) | ORAL | 0 refills | Status: DC | PRN

## 2017-10-11 MED ORDER — GI COCKTAIL ~~LOC~~
30.0000 mL | Freq: Once | ORAL | Status: AC
  Administered 2017-10-11: 30 mL via ORAL

## 2017-10-11 NOTE — ED Provider Notes (Signed)
[facility]    CSN: [id number] Arrival date & time: 10/11/17  1355     History   Chief Complaint Chief Complaint  Patient presents with  . Generalized Body Aches    HPI Johnnette LitterJose Corpus is a 52 y.o. male.   [name] presents with complaints of body aches and nausea which started last night and worse today. No vomiting. Denies recent alcohol intake. Complaints of workers with flu. Has not vomited. Normal bowel movement this morning. Denies abdominal pain, cough, runny nose, headache, urinary symptoms, sore throat or ear pain. Took tylenol which did not help. He states he is not taking medications for gerd, protonix and carafate on med rec. History of anxiety, htn, alcohol abuse.     ROS per HPI.       Past Medical History:  Diagnosis Date  . Alcohol abuse   . Anxiety   . Anxiety   . Chronic neck pain   . Cocaine abuse (HCC)   . Diabetes mellitus without complication (HCC)   . Hypertension     Patient Active Problem List   Diagnosis Date Noted  . 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 Prediabetes 04/23/2017  . Muscle spasm 04/23/2017  . Essential hypertension 04/16/2017  . H/O medication noncompliance 04/02/2017  . Vitamin D deficiency 04/02/2017  . DJD (degenerative joint disease) of cervical spine 03/16/2017  . 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 Dizziness 01/23/2017  . Anxiety 11/21/2016    Past Surgical History:  Procedure Laterality Date  . EYE SURGERY Right ~ 1980   "hit my eye"       Home Medications    Prior to Admission medications   Medication Sig Start Date End Date Taking? Authorizing Provider  acetaminophen-codeine 120-12 MG/5ML suspension Take 5 mLs by mouth at bedtime as needed (cough). 09/30/17   Wallis BambergMani, Mario, PA-C  amLODipine (NORVASC) 5 MG tablet Take 1.5 tablets (7.5 mg total) by mouth daily. 07/03/17   Marcine MatarJohnson, Deborah B, MD  azithromycin (ZITHROMAX) 250 MG tablet Start with 2 tablets today, then 1 daily thereafter.  09/30/17   Wallis BambergMani, Mario, PA-C  benzonatate (TESSALON) 100 MG capsule Take 1-2 capsules (100-200 mg total) by mouth 3 (three) times daily as needed for cough. 09/30/17   Wallis BambergMani, Mario, PA-C  FLUoxetine (PROZAC) 10 MG tablet Take 1 tablet (10 mg total) by mouth daily. 08/29/17   Mardella LaymanHagler, Brian, MD  LORazepam (ATIVAN) 0.5 MG tablet Take 1 tablet (0.5 mg total) by mouth 2 (two) times daily. for anxiety 08/05/17   Elvina SidleLauenstein, Kurt, MD  meclizine (ANTIVERT) 25 MG tablet Take 1 tablet (25 mg total) by mouth 3 (three) times daily as needed for dizziness. 09/03/17   Shaune PollackIsaacs, Cameron, MD  meloxicam (MOBIC) 7.5 MG tablet Take 1 tablet (7.5 mg total) by mouth daily. 09/07/17   Georgetta HaberBurky, Natalie B, NP  pantoprazole (PROTONIX) 20 MG tablet Take 1 tablet (20 mg total) by mouth daily. 08/20/17 09/19/17  Long, Arlyss RepressJoshua G, MD  promethazine (PHENERGAN) 25 MG tablet Take 1 tablet (25 mg total) by mouth every 8 (eight) hours as needed for nausea or vomiting. 10/11/17   Georgetta HaberBurky, Natalie B, NP  rOPINIRole (REQUIP) 1 MG tablet Take 1 tablet (1 mg total) by mouth at bedtime. 09/23/17   Elvina SidleLauenstein, Kurt, MD  sucralfate (CARAFATE) 1 g tablet Take 1 tablet (1 g total) by mouth 4 (four) times daily -  with meals and at bedtime. 08/20/17 09/19/17  Long, Arlyss RepressJoshua G, MD  Family History Family History  Problem Relation Age of Onset  . Hypertension Mother   . Hypertension Father     Social History Social History   Tobacco Use  . Smoking status: Never Smoker  . Smokeless tobacco: Never Used  Substance Use Topics  . Alcohol use: No    Comment: former  . Drug use: No    Comment: denies, reports past history      Allergies   Ibuprofen; Other; and Zofran [ondansetron hcl]   Review of Systems Review of Systems   Physical Exam Triage Vital Signs ED Triage Vitals [10/11/17 1410]  Enc Vitals Group     BP 113/67     Pulse Rate 80     Resp 18     Temp 98.1 F (36.7 C)     Temp src      SpO2 100 %     Weight      Height       Head Circumference      Peak Flow      Pain Score      Pain Loc      Pain Edu?      Excl. in GC?    No data found.  Updated Vital Signs BP 113/67   Pulse 80   Temp 98.1 F (36.7 C)   Resp 18   SpO2 100%   Visual Acuity Right Eye Distance:   Left Eye Distance:   Bilateral Distance:    Right Eye Near:   Left Eye Near:    Bilateral Near:     Physical Exam  Constitutional: He is oriented to person, place, and time. He appears well-developed and well-nourished.  HENT:  Head: Normocephalic and atraumatic.  Right Ear: Tympanic membrane, external ear and ear canal normal.  Left Ear: Tympanic membrane, external ear and ear canal normal.  Nose: Nose normal. Right sinus exhibits no maxillary sinus tenderness and no frontal sinus tenderness. Left sinus exhibits no maxillary sinus tenderness and no frontal sinus tenderness.  Mouth/Throat: Uvula is midline, oropharynx is clear and moist and mucous membranes are normal.  Eyes: Conjunctivae are normal. Pupils are equal, round, and reactive to light.  Neck: Normal range of motion.  Cardiovascular: Normal rate and regular rhythm.  Pulmonary/Chest: Effort normal and breath sounds normal.  Abdominal: Soft. There is tenderness in the epigastric area. There is no rigidity, no rebound, no guarding, no CVA tenderness, no tenderness at McBurney's point and negative Murphy's sign.  Lymphadenopathy:    He has no cervical adenopathy.  Neurological: He is alert and oriented to person, place, and time.  Skin: Skin is warm and dry.  Vitals reviewed.    UC Treatments / Results  Labs (all labs ordered are listed, but only abnormal results are displayed) Labs Reviewed - No data to display  EKG  EKG Interpretation None       Radiology No results found.  Procedures Procedures (including critical care time)  Medications Ordered in UC Medications  gi cocktail (Maalox,Lidocaine,Donnatal) (not administered)     Initial Impression /  Assessment and Plan / UC Course  I have reviewed the triage vital signs and the nursing notes.  Pertinent labs & imaging results that were available during my care of the patient were reviewed by me and considered in my medical decision making (see chart for details).     Non toxic in appearance. Vitals stable. Epigastric pain on exam. Gi cocktail provided. Reordered protonix as it appears that this order  has expired and patient states not taking. Phenergan as needed, x 10tabs. Tylenol as needed. Return precautions provided. If symptoms worsen or do not improve in the next week to return to be seen or to follow up with PCP.  Patient verbalized understanding and agreeable to plan.    Final Clinical Impressions(s) / UC Diagnoses   Final diagnoses:  Body aches  Nausea    ED Discharge Orders        Ordered    promethazine (PHENERGAN) 25 MG tablet  Every 8 hours PRN     10/11/17 1451       Controlled Substance Prescriptions [place] Controlled Substance Registry consulted? Not Applicable   Georgetta Haber, NP 10/11/17 1458

## 2017-10-11 NOTE — ED Triage Notes (Signed)
Pt c/o generalized body aches, stomach cramping since this morning. Pt is in NAD, vss.

## 2017-10-11 NOTE — Discharge Instructions (Signed)
Continue with liquid diet as tolerated to ensure adequate hydration. Rest. Tylenol as needed for body aches. Phenergan as needed for nausea. May cause drowsiness, do not take prior to driving or if drinking alcohol. If develop worsening symptoms, pain, fevers, dehydration please return or go to Er.

## 2017-10-14 ENCOUNTER — Other Ambulatory Visit: Payer: Self-pay

## 2017-10-14 ENCOUNTER — Encounter (HOSPITAL_COMMUNITY): Payer: Self-pay | Admitting: *Deleted

## 2017-10-14 ENCOUNTER — Emergency Department (HOSPITAL_COMMUNITY)
Admission: EM | Admit: 2017-10-14 | Discharge: 2017-10-14 | Disposition: A | Discharge status: Home / Self Care | Payer: Self-pay | Attending: Emergency Medicine | Admitting: Emergency Medicine

## 2017-10-14 ENCOUNTER — Ambulatory Visit (HOSPITAL_COMMUNITY)
Admission: EM | Admit: 2017-10-14 | Discharge: 2017-10-14 | Disposition: A | Discharge status: Home / Self Care | Payer: Self-pay | Attending: Family Medicine | Admitting: Family Medicine

## 2017-10-14 ENCOUNTER — Encounter (HOSPITAL_COMMUNITY): Payer: Self-pay | Admitting: Emergency Medicine

## 2017-10-14 DIAGNOSIS — M5432 Sciatica, left side: Secondary | ICD-10-CM

## 2017-10-14 DIAGNOSIS — G8929 Other chronic pain: Secondary | ICD-10-CM

## 2017-10-14 DIAGNOSIS — E119 Type 2 diabetes mellitus without complications: Secondary | ICD-10-CM | POA: Insufficient documentation

## 2017-10-14 DIAGNOSIS — Z79899 Other long term (current) drug therapy: Secondary | ICD-10-CM | POA: Insufficient documentation

## 2017-10-14 DIAGNOSIS — Z5321 Procedure and treatment not carried out due to patient leaving prior to being seen by health care provider: Secondary | ICD-10-CM | POA: Insufficient documentation

## 2017-10-14 DIAGNOSIS — I10 Essential (primary) hypertension: Secondary | ICD-10-CM | POA: Insufficient documentation

## 2017-10-14 DIAGNOSIS — M5442 Lumbago with sciatica, left side: Secondary | ICD-10-CM | POA: Insufficient documentation

## 2017-10-14 MED ORDER — PREDNISONE 20 MG PO TABS: ORAL_TABLET | ORAL | 0 refills | Status: DC

## 2017-10-14 MED ORDER — LIDOCAINE 5 % EX PTCH: 1.0000 | MEDICATED_PATCH | CUTANEOUS | 0 refills | Status: DC

## 2017-10-14 MED ORDER — METHOCARBAMOL 500 MG PO TABS: 500.0000 mg | ORAL_TABLET | Freq: Two times a day (BID) | ORAL | 0 refills | Status: DC

## 2017-10-14 MED ORDER — GABAPENTIN 300 MG PO CAPS: 300.0000 mg | ORAL_CAPSULE | Freq: Every day | ORAL | 1 refills | Status: DC

## 2017-10-14 NOTE — ED Triage Notes (Signed)
PT C/O: constant back pain on left side that radiates down left leg.... Denies inj/trauma  TAKING MEDS: Acetaminophen and Ibuprofen   A&O x4... NAD... Ambulatory

## 2017-10-14 NOTE — ED Notes (Signed)
Bed: WTR5 Expected date:  Expected time:  Means of arrival:  Comments: 

## 2017-10-14 NOTE — ED Triage Notes (Addendum)
Pt here this am, left before he was called back. Pt then went to UC and was treated. Now returns with same complaint of left hip pain radiating down posterior leg. Pt ambulating without difficulty, laughing, showing no s/s of pain. States he took his pain medicine a couple hours ago and has not had any relief.

## 2017-10-14 NOTE — ED Notes (Signed)
Pt ambulated out with out assistance. Pt verbalizes understanding.

## 2017-10-14 NOTE — ED Provider Notes (Signed)
[facility]EMERGENCY DEPT Provider Note   CSN: [id number] Arrival date & time: 10/14/17  1700     History   Chief Complaint Chief Complaint  Patient presents with  . Hip Pain    HPI Wesley [name] is a 52 y.o. male.  HPI   Wesley [name] is a 52 y.o. male, with a history of polysubstance abuse, anxiety, sciatica, DM, and HTN, presenting to the ED with left lower back pain beginning a few days ago.  Patient has a history of the same.  Pain is moderate to severe, sharp, extending down the posterior left leg.  Also endorses some tingling sensation to the left thigh.  Denies falls/trauma, weakness, fever/chills, abdominal pain, changes in bowel or bladder function, saddle anesthesias, or any other complaints.  Patient checked into the ED this morning but left without being seen.  He then went to urgent care where he was evaluated and prescribed prednisone and gabapentin.  He states these have not relieved his pain.    Past Medical History:  Diagnosis Date  . Alcohol abuse   . Anxiety   . Anxiety   . Chronic neck pain   . Cocaine abuse (HCC)   . Diabetes mellitus without complication (HCC)   . Hypertension     Patient Active Problem List   Diagnosis Date Noted  . 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 Prediabetes 04/23/2017  . Muscle spasm 04/23/2017  . Essential hypertension 04/16/2017  . H/O medication noncompliance 04/02/2017  . Vitamin D deficiency 04/02/2017  . DJD (degenerative joint disease) of cervical spine 03/16/2017  . 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 Dizziness 01/23/2017  . Anxiety 11/21/2016    Past Surgical History:  Procedure Laterality Date  . EYE SURGERY Right ~ 1980   "hit my eye"       Home Medications    Prior to Admission medications   Medication Sig Start Date End Date Taking? Authorizing Provider  amLODipine (NORVASC) 5 MG tablet Take 1.5 tablets (7.5 mg total) by mouth daily. 07/03/17   Marcine MatarJohnson, Deborah  B, MD  gabapentin (NEURONTIN) 300 MG capsule Take 1 capsule (300 mg total) by mouth at bedtime. 10/14/17   Elvina SidleLauenstein, Kurt, MD  lidocaine (LIDODERM) 5 % Place 1 patch onto the skin daily. Remove & Discard patch within 12 hours or as directed by MD 10/14/17   Joy, Shawn C, PA-C  LORazepam (ATIVAN) 0.5 MG tablet Take 1 tablet (0.5 mg total) by mouth 2 (two) times daily. for anxiety 08/05/17   Elvina SidleLauenstein, Kurt, MD  methocarbamol (ROBAXIN) 500 MG tablet Take 1 tablet (500 mg total) by mouth 2 (two) times daily. 10/14/17   Joy, Shawn C, PA-C  pantoprazole (PROTONIX) 20 MG tablet Take 1 tablet (20 mg total) by mouth daily. 10/11/17 11/10/17  Georgetta HaberBurky, Natalie B, NP  predniSONE (DELTASONE) 20 MG tablet Two daily with food 10/14/17   Elvina SidleLauenstein, Kurt, MD    Family History Family History  Problem Relation Age of Onset  . Hypertension Mother   . Hypertension Father     Social History Social History   Tobacco Use  . Smoking status: Never Smoker  . Smokeless tobacco: Never Used  Substance Use Topics  . Alcohol use: No    Comment: former  . Drug use: No    Comment: denies, reports past history      Allergies   Ibuprofen; Other; and Zofran [ondansetron hcl]   Review of Systems Review of Systems  Constitutional:  Negative for chills and fever.  Gastrointestinal: Negative for abdominal pain, nausea and vomiting.  Genitourinary: Negative for difficulty urinating.  Musculoskeletal: Positive for back pain. Negative for neck pain.  Neurological: Negative for syncope and weakness.  All other systems reviewed and are negative.    Physical Exam Updated Vital Signs BP (!) 127/92 (BP Location: Left Arm)   Pulse 77   Temp 98.4 F (36.9 C) (Oral)   Resp 18   Ht 5\' 6"  (1.676 m)   Wt 87.1 kg (192 lb)   SpO2 99%   BMI 30.99 kg/m   Physical Exam  Constitutional: He appears well-developed and well-nourished. No distress.  HENT:  Head: Normocephalic and atraumatic.  Eyes: Conjunctivae are normal.    Neck: Neck supple.  Cardiovascular: Normal rate, regular rhythm and intact distal pulses.  Pulmonary/Chest: Effort normal. No respiratory distress.  Abdominal: Soft. There is no tenderness. There is no guarding.  Musculoskeletal: He exhibits tenderness. He exhibits no edema.  Tenderness to the left lumbar musculature into the left buttocks. Normal range of motion through the cardinal directions of the left hip and knee. Normal motor function intact in all extremities and spine. No midline spinal tenderness.   Lymphadenopathy:    He has no cervical adenopathy.  Neurological: He is alert.  No noted acute sensory deficits. Strength 5/5 with flexion and extension at the bilateral hips, knees, and ankles. No noted gait deficit. Coordination intact with heel to shin testing.  Skin: Skin is warm and dry. He is not diaphoretic.  Psychiatric: He has a normal mood and affect. His behavior is normal.  Nursing note and vitals reviewed.    ED Treatments / Results  Labs (all labs ordered are listed, but only abnormal results are displayed) Labs Reviewed - No data to display  EKG  EKG Interpretation None       Radiology No results found.  Procedures Procedures (including critical care time)  Medications Ordered in ED Medications - No data to display   Initial Impression / Assessment and Plan / ED Course  I have reviewed the triage vital signs and the nursing notes.  Pertinent labs & imaging results that were available during my care of the patient were reviewed by me and considered in my medical decision making (see chart for details).     Patient presents with symptoms consistent with sciatica.  No red flag symptoms.  No traumatic origin.  No neuro or functional deficit.  I agree with the previous regimen prescribed at urgent care today.  Encouraged patient to give this regimen a chance. PCP vs ortho follow up. The patient was given instructions for home care as well as return  precautions. Patient voices understanding of these instructions, accepts the plan, and is comfortable with discharge.     Final Clinical Impressions(s) / ED Diagnoses   Final diagnoses:  Chronic left-sided low back pain with left-sided sciatica    ED Discharge Orders        Ordered    methocarbamol (ROBAXIN) 500 MG tablet  2 times daily     10/14/17 1824    lidocaine (LIDODERM) 5 %  Every 24 hours     10/14/17 1824       Anselm Pancoast, PA-C 10/14/17 Salvadore Farber, MD 10/14/17 2308

## 2017-10-14 NOTE — Discharge Instructions (Signed)
Expect your soreness to increase over the next 2-3 days. Take it easy, but do not lay around too much as this may make any stiffness worse.  Pain: Take the prednisone and gabapentin that was previously prescribed to you, as directed. Muscle relaxer: Robaxin is a muscle relaxer and may help loosen stiff muscles. Do not take the Robaxin while driving or performing other dangerous activities.  Lidocaine patches: These are available via either prescription or over-the-counter. The over-the-counter option may be more economical one and are likely just as effective. There are multiple over-the-counter brands, such as Salonpas. Exercises: Be sure to perform the attached exercises starting with three times a week and working up to performing them daily. This is an essential part of preventing long term problems.   Follow up with a primary care provider or orthopedic specialist for any future management of these complaints.

## 2017-10-14 NOTE — ED Triage Notes (Signed)
C/o left hip pain when sitting radiating down back of leg, sharp in nature. No other symptoms, walks without difficulty, pt in lobby with s/o raising left leg up in air, pt showed no s/s of distress.

## 2017-10-14 NOTE — ED Provider Notes (Signed)
[facility]   [id number] 10/14/17 Arrival Time: 1248   SUBJECTIVE:  Wesley [name] is a 52 y.o. male who presents to the urgent care with complaint of backache of several days duration.  He works as a Education administratorpainter and states that the pain begins in his left lumbar paraspinal area and radiates down to the hip all the way down to the calf.   No fever, leg weakness, urinary or bladder problems, any incontinence, or injury  Patient does have some numbness in the lateral anterior left thigh   Note from October 11, 2017: Wesley QuickJose presents with complaints of body aches and nausea which started last night and worse today. No vomiting. Denies recent alcohol intake. Complaints of workers with flu. Has not vomited. Normal bowel movement this morning. Denies abdominal pain, cough, runny nose, headache, urinary symptoms, sore throat or ear pain. Took tylenol which did not help. He states he is not taking medications for gerd, protonix and carafate on med rec. History of anxiety, htn, alcohol abuse.      Past Medical History:  Diagnosis Date  . Alcohol abuse   . Anxiety   . Anxiety   . Chronic neck pain   . Cocaine abuse (HCC)   . Diabetes mellitus without complication (HCC)   . Hypertension    Family History  Problem Relation Age of Onset  . Hypertension Mother   . Hypertension Father    Social History   Socioeconomic History  . Marital status: Significant Other    Spouse name: Not on file  . Number of children: Not on file  . Years of education: Not on file  . Highest education level: Not on file  Social Needs  . Financial resource strain: Not on file  . Food insecurity - worry: Not on file  . Food insecurity - inability: Not on file  . Transportation needs - medical: Not on file  . Transportation needs - non-medical: Not on file  Occupational History  . Not on file  Tobacco Use  . Smoking status: Never Smoker  . Smokeless tobacco: Never Used  Substance and Sexual Activity    . Alcohol use: No    Comment: former  . Drug use: No    Comment: denies, reports past history   . Sexual activity: Not on file  Other Topics Concern  . Not on file  Social History Narrative  . Not on file   Current Meds  Medication Sig  . amLODipine (NORVASC) 5 MG tablet Take 1.5 tablets (7.5 mg total) by mouth daily.  Marland Kitchen. LORazepam (ATIVAN) 0.5 MG tablet Take 1 tablet (0.5 mg total) by mouth 2 (two) times daily. for anxiety  . pantoprazole (PROTONIX) 20 MG tablet Take 1 tablet (20 mg total) by mouth daily.  . [DISCONTINUED] acetaminophen-codeine 120-12 MG/5ML suspension Take 5 mLs by mouth at bedtime as needed (cough).  . [DISCONTINUED] benzonatate (TESSALON) 100 MG capsule Take 1-2 capsules (100-200 mg total) by mouth 3 (three) times daily as needed for cough.  . [DISCONTINUED] FLUoxetine (PROZAC) 10 MG tablet Take 1 tablet (10 mg total) by mouth daily.  . [DISCONTINUED] meclizine (ANTIVERT) 25 MG tablet Take 1 tablet (25 mg total) by mouth 3 (three) times daily as needed for dizziness.  . [DISCONTINUED] meloxicam (MOBIC) 7.5 MG tablet Take 1 tablet (7.5 mg total) by mouth daily.  . [DISCONTINUED] promethazine (PHENERGAN) 25 MG tablet Take 1 tablet (25 mg total) by mouth every 8 (eight) hours as needed for nausea or vomiting.  . [  DISCONTINUED] rOPINIRole (REQUIP) 1 MG tablet Take 1 tablet (1 mg total) by mouth at bedtime.   Allergies  Allergen Reactions  . Ibuprofen Other (See Comments) and Rash    Makes his throat get "very dry" after taking it States he cant take it  . Other Other (See Comments)    Steroids: Patient received a steroid shot from [facility] [phone] and it resulted in his muscles twitching all over his body  . Zofran [Ondansetron Hcl] Other (See Comments)    Causes sweating and body "feels badly"      ROS: As per HPI, remainder of ROS negative.   OBJECTIVE:   Vitals:   10/14/17 1356  BP: 130/85  Pulse: 79  Resp: 18  Temp: 97.6 F  (36.4 C)  TempSrc: Oral  SpO2: 96%     General appearance: alert; no distress Eyes: PERRL; EOMI; conjunctiva normal HENT: normocephalic; atraumatic; TMs normal, canal normal, external ears normal without trauma; nasal mucosa normal; oral mucosa normal Neck: supple Lungs: clear to auscultation bilaterally Heart: regular rate and rhythm Abdomen: soft, non-tender; bowel sounds normal; no masses or organomegaly; no guarding or rebound tenderness Back: no CVA tenderness Extremities: no cyanosis or edema; symmetrical with no gross deformities; negative straight leg raising  Skin: warm and dry Neurologic: normal gait; grossly normal Psychological: alert and cooperative; normal mood and affect      Labs:  Results for orders placed or performed during the hospital encounter of 09/30/17  CBC with Differential  Result Value Ref Range   WBC 7.9 4.0 - 10.5 K/uL   RBC 5.04 4.22 - 5.81 MIL/uL   Hemoglobin 13.6 13.0 - 17.0 g/dL   HCT 45.4 09.8 - 11.9 %   MCV 79.8 78.0 - 100.0 fL   MCH 27.0 26.0 - 34.0 pg   MCHC 33.8 30.0 - 36.0 g/dL   RDW 14.7 82.9 - 56.2 %   Platelets 273 150 - 400 K/uL   Neutrophils Relative % 73 %   Neutro Abs 5.8 1.7 - 7.7 K/uL   Lymphocytes Relative 11 %   Lymphs Abs 0.9 0.7 - 4.0 K/uL   Monocytes Relative 15 %   Monocytes Absolute 1.2 (H) 0.1 - 1.0 K/uL   Eosinophils Relative 1 %   Eosinophils Absolute 0.1 0.0 - 0.7 K/uL   Basophils Relative 0 %   Basophils Absolute 0.0 0.0 - 0.1 K/uL  Basic metabolic panel  Result Value Ref Range   Sodium 132 (L) 135 - 145 mmol/L   Potassium 3.5 3.5 - 5.1 mmol/L   Chloride 99 (L) 101 - 111 mmol/L   CO2 25 22 - 32 mmol/L   Glucose, Bld 107 (H) 65 - 99 mg/dL   BUN 8 6 - 20 mg/dL   Creatinine, Ser 1.30 0.61 - 1.24 mg/dL   Calcium 8.8 (L) 8.9 - 10.3 mg/dL   GFR calc non Af Amer >60 >60 mL/min   GFR calc Af Amer >60 >60 mL/min   Anion gap 8 5 - 15    Labs Reviewed - No data to display  No results  found.     ASSESSMENT & PLAN:  1. Sciatica of left side     Meds ordered this encounter  Medications  . predniSONE (DELTASONE) 20 MG tablet    Sig: Two daily with food    Dispense:  10 tablet    Refill:  0  . gabapentin (NEURONTIN) 300 MG capsule    Sig: Take 1 capsule (  300 mg total) by mouth at bedtime.    Dispense:  21 capsule    Refill:  1    Reviewed expectations re: course of current medical issues. Questions answered. Outlined signs and symptoms indicating need for more acute intervention. Patient verbalized understanding. After Visit Summary given.    Procedures:      Elvina Sidle, MD 10/14/17 1406

## 2017-10-16 ENCOUNTER — Other Ambulatory Visit: Payer: Self-pay

## 2017-10-16 ENCOUNTER — Ambulatory Visit (HOSPITAL_COMMUNITY)
Admission: EM | Admit: 2017-10-16 | Discharge: 2017-10-16 | Disposition: A | Discharge status: Home / Self Care | Payer: Self-pay | Attending: Emergency Medicine | Admitting: Emergency Medicine

## 2017-10-16 ENCOUNTER — Encounter (HOSPITAL_COMMUNITY): Payer: Self-pay

## 2017-10-16 DIAGNOSIS — M5416 Radiculopathy, lumbar region: Secondary | ICD-10-CM

## 2017-10-16 MED ORDER — PREDNISONE 10 MG PO TABS: ORAL_TABLET | ORAL | 0 refills | Status: DC

## 2017-10-16 MED ORDER — HYDROCODONE-ACETAMINOPHEN 5-325 MG PO TABS: 1.0000 | ORAL_TABLET | ORAL | 0 refills | Status: DC | PRN

## 2017-10-16 NOTE — ED Triage Notes (Signed)
[facility] left hip pain radiating down posterior leg and back pain. Pt has taken muscle relaxer today for pain but has no relief

## 2017-10-16 NOTE — Discharge Instructions (Signed)
Please take medications as prescribed.  Call tomorrow morning to schedule follow-up appointment with orthopedist.  Return to the urgent care facility for any increasing pain, weakness, worsening symptoms or any changes in health.

## 2017-10-16 NOTE — ED Provider Notes (Signed)
[facility]    CSN: [id number] Arrival date & time: 10/16/17  1837     History   Chief Complaint Chief Complaint  Patient presents with  . Back Pain  . Flank Pain    left    HPI Wesley [name] is a 52 y.o. male presents to the urgent care facility for evaluation pain burning numbness and tingling going down his left lateral thigh into the knee.  patient had similar symptoms on 10/14/2017 and was starting to get improvement with lidocaine patches and methocarbamol but over the last day his symptoms have increased.  Pain is 8 out of 10.  No trauma or injury.  No loss of bowel or bladder symptoms.  He is ambulatory with no assistive devices.  He has not scheduled appointment with orthopedics.  HPI  Past Medical History:  Diagnosis Date  . Alcohol abuse   . Anxiety   . Anxiety   . Chronic neck pain   . Cocaine abuse (HCC)   . Diabetes mellitus without complication (HCC)   . Hypertension     Patient Active Problem List   Diagnosis Date Noted  . Legally blind in right eye, as defined in [place] 05/02/2017  . Prediabetes 04/23/2017  . Muscle spasm 04/23/2017  . Essential hypertension 04/16/2017  . H/O medication noncompliance 04/02/2017  . Vitamin D deficiency 04/02/2017  . DJD (degenerative joint disease) of cervical spine 03/16/2017  . 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 Dizziness 01/23/2017  . Anxiety 11/21/2016    Past Surgical History:  Procedure Laterality Date  . EYE SURGERY Right ~ 1980   "hit my eye"       Home Medications    Prior to Admission medications   Medication Sig Start Date End Date Taking? Authorizing Provider  amLODipine (NORVASC) 5 MG tablet Take 1.5 tablets (7.5 mg total) by mouth daily. 07/03/17  Yes Marcine Matar, MD  LORazepam (ATIVAN) 0.5 MG tablet Take 1 tablet (0.5 mg total) by mouth 2 (two) times daily. for anxiety 08/05/17  Yes Elvina Sidle, MD  methocarbamol (ROBAXIN) 500 MG tablet Take 1 tablet (500 mg total) by mouth 2 (two)  times daily. 10/14/17  Yes Joy, Shawn C, PA-C  gabapentin (NEURONTIN) 300 MG capsule Take 1 capsule (300 mg total) by mouth at bedtime. 10/14/17   Elvina Sidle, MD  HYDROcodone-acetaminophen (NORCO) 5-325 MG tablet Take 1 tablet by mouth every 4 (four) hours as needed for moderate pain. 10/16/17   Evon Slack, PA-C  lidocaine (LIDODERM) 5 % Place 1 patch onto the skin daily. Remove & Discard patch within 12 hours or as directed by MD 10/14/17   Joy, Shawn C, PA-C  pantoprazole (PROTONIX) 20 MG tablet Take 1 tablet (20 mg total) by mouth daily. 10/11/17 11/10/17  Georgetta Haber, NP  predniSONE (DELTASONE) 10 MG tablet 10 day taper. 5,5,4,4,3,3,2,2,1,1 10/16/17   Evon Slack, PA-C    Family History Family History  Problem Relation Age of Onset  . Hypertension Mother   . Hypertension Father     Social History Social History   Tobacco Use  . Smoking status: Never Smoker  . Smokeless tobacco: Never Used  Substance Use Topics  . Alcohol use: No    Comment: former  . Drug use: No    Comment: denies, reports past history      Allergies   Ibuprofen; Other; and Zofran [ondansetron hcl]   Review of Systems Review of Systems  Constitutional: Negative for fever.  Respiratory: Negative  for shortness of breath.   Cardiovascular: Negative for chest pain.  Gastrointestinal: Negative for abdominal pain.  Genitourinary: Negative for difficulty urinating, dysuria and urgency.  Musculoskeletal: Positive for back pain. Negative for gait problem and myalgias.  Skin: Negative for rash.  Neurological: Positive for numbness. Negative for dizziness and headaches.     Physical Exam Triage Vital Signs ED Triage Vitals  Enc Vitals Group     BP 10/16/17 2002 126/84     Pulse Rate 10/16/17 2002 (!) 104     Resp 10/16/17 2002 18     Temp 10/16/17 2002 98 F (36.7 C)     Temp Source 10/16/17 2002 Oral     SpO2 10/16/17 2002 98 %     Weight --      Height --      Head Circumference --       Peak Flow --      Pain Score 10/16/17 2003 10     Pain Loc --      Pain Edu? --      Excl. in GC? --    No data found.  Updated Vital Signs BP 126/84 (BP Location: Left Arm)   Pulse (!) 104   Temp 98 F (36.7 C) (Oral)   Resp 18   SpO2 98%   Visual Acuity Right Eye Distance:   Left Eye Distance:   Bilateral Distance:    Right Eye Near:   Left Eye Near:    Bilateral Near:     Physical Exam  Constitutional: He is oriented to person, place, and time. He appears well-developed and well-nourished.  HENT:  Head: Normocephalic and atraumatic.  Eyes: Conjunctivae are normal.  Neck: Normal range of motion.  Cardiovascular: Normal rate.  Pulmonary/Chest: Effort normal. No respiratory distress.  Musculoskeletal:  Lumbar Spine: Examination of the lumbar spine reveals no bony abnormality, no edema, and no ecchymosis.  There is no step off.  The patient has full range of motion of the lumbar spine with flexion and extension.  The patient has normal lateral bend and rotation.  The patient has no pain with range of motion activities.  The patient has a negative axial load test, and a negative rotational Waddell test.  The patient is non tender along the spinous process.  The patient is non tender along the paravertebral muscles, with no muscle spasms.  The patient is non tender along the iliac crest.  The patient is non tender in the sciatic notch.  The patient is non tender along the Sacroiliac joint.  There is no Coccyx joint tenderness.    Bilateral Lower Extremities: Examination of the lower extremities reveals no bony abnormality, no edema, and no ecchymosis.  The patient has full active and passive range of motion of the hips, knees, and ankles.  There is no discomfort with range of motion exercises.  The patient is non tender along the greater trochanter region.  The patient has a negative Denna Haggard' test bilaterally.  There is normal skin warmth.  There is normal capillary refill  bilaterally.    Neurologic: The patient has a negative straight leg raise.  The patient has normal muscle strength testing for the quadriceps, calves, ankle dorsiflexion, ankle plantarflexion, and extensor hallicus longus.  The patient has sensation discrepancy in the left anterior lateral thigh when compared to the right side.  Patellar reflexes are normal bilaterally.  Neurological: He is alert and oriented to person, place, and time.  Skin: Skin is warm. No rash  noted.  Psychiatric: He has a normal mood and affect. His behavior is normal. Thought content normal.     UC Treatments / Results  Labs (all labs ordered are listed, but only abnormal results are displayed) Labs Reviewed - No data to display  EKG  EKG Interpretation None       Radiology No results found.  Procedures Procedures (including critical care time)  Medications Ordered in UC Medications - No data to display   Initial Impression / Assessment and Plan / UC Course  I have reviewed the triage vital signs and the nursing notes.  Pertinent labs & imaging results that were available during my care of the patient were reviewed by me and considered in my medical decision making (see chart for details).     52 year old male with left lumbar radiculopathy.  No evidence of trauma.  No saddle anesthesia, loss of bowel or bladder symptoms.  Patient is placed on a 10-day steroid taper and given Norco for severe pain.  He will continue with methocarbamol.  He will follow-up with orthopedics.  He is educated on signs and symptoms return to clinic for.  Final Clinical Impressions(s) / UC Diagnoses   Final diagnoses:  Left lumbar radiculopathy    ED Discharge Orders        Ordered    HYDROcodone-acetaminophen (NORCO) 5-325 MG tablet  Every 4 hours PRN     10/16/17 2016    predniSONE (DELTASONE) 10 MG tablet     10/16/17 2016       Controlled Substance Prescriptions [place] Controlled Substance Registry consulted?  Yes, I have consulted the [place] Controlled Substances Registry for this patient, and feel the risk/benefit ratio today is favorable for proceeding with this prescription for a controlled substance.   Evon SlackGaines, Thomas C, [place] JerseyPA-C 10/16/17 2024

## 2017-10-18 ENCOUNTER — Emergency Department (HOSPITAL_COMMUNITY)
Admission: EM | Admit: 2017-10-18 | Discharge: 2017-10-18 | Disposition: A | Discharge status: Home / Self Care | Payer: Self-pay

## 2017-10-18 ENCOUNTER — Other Ambulatory Visit: Payer: Self-pay

## 2017-10-18 NOTE — ED Notes (Signed)
Called for triage. No response x1

## 2017-10-18 NOTE — ED Notes (Signed)
Second call for triage. No response.

## 2017-10-21 ENCOUNTER — Encounter (HOSPITAL_COMMUNITY): Payer: Self-pay

## 2017-10-21 ENCOUNTER — Ambulatory Visit (INDEPENDENT_AMBULATORY_CARE_PROVIDER_SITE_OTHER): Payer: Self-pay

## 2017-10-21 ENCOUNTER — Ambulatory Visit (HOSPITAL_COMMUNITY)
Admission: EM | Admit: 2017-10-21 | Discharge: 2017-10-21 | Disposition: A | Discharge status: Home / Self Care | Payer: Self-pay | Attending: Physician Assistant | Admitting: Physician Assistant

## 2017-10-21 DIAGNOSIS — M5136 Other intervertebral disc degeneration, lumbar region: Secondary | ICD-10-CM

## 2017-10-21 DIAGNOSIS — G8929 Other chronic pain: Secondary | ICD-10-CM

## 2017-10-21 DIAGNOSIS — M545 Low back pain: Secondary | ICD-10-CM

## 2017-10-21 MED ORDER — MELOXICAM 15 MG PO TABS: 7.5000 mg | ORAL_TABLET | Freq: Every day | ORAL | 0 refills | Status: AC
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
## 2017-10-21 NOTE — ED Triage Notes (Signed)
Pt was seen here a few days ago for back pain and was given ibuprofen. Said it hasn't helped and now having pain on the left side radiating down his left leg and feels as if it's "asleep". States the only time he feels relief is when he is asleep. Driving and walking makes it worse.

## 2017-10-21 NOTE — Discharge Instructions (Signed)
We have seen you multiple times and it appears that we are not making it better.  I prescribed you some meloxicam which is an NSAID.  This should help some with your chronic back pain.  You should be taking Tylenol 1000 mg every 8 hours for base pain control.  Continue taking her gabapentin as prescribed.  I have referred you to orthopedics.  Please call them tomorrow and make an appointment for sometime next week with a week after.

## 2017-10-22 ENCOUNTER — Encounter (HOSPITAL_COMMUNITY): Payer: Self-pay | Admitting: Emergency Medicine

## 2017-10-22 ENCOUNTER — Emergency Department (HOSPITAL_COMMUNITY)
Admission: EM | Admit: 2017-10-22 | Discharge: 2017-10-22 | Disposition: A | Discharge status: Home / Self Care | Payer: Self-pay | Attending: Emergency Medicine | Admitting: Emergency Medicine

## 2017-10-22 DIAGNOSIS — H5410 Blindness, one eye, low vision other eye, unspecified eyes: Secondary | ICD-10-CM | POA: Insufficient documentation

## 2017-10-22 DIAGNOSIS — E119 Type 2 diabetes mellitus without complications: Secondary | ICD-10-CM | POA: Insufficient documentation

## 2017-10-22 DIAGNOSIS — I10 Essential (primary) hypertension: Secondary | ICD-10-CM | POA: Insufficient documentation

## 2017-10-22 DIAGNOSIS — Z79899 Other long term (current) drug therapy: Secondary | ICD-10-CM | POA: Insufficient documentation

## 2017-10-22 DIAGNOSIS — G8929 Other chronic pain: Secondary | ICD-10-CM | POA: Insufficient documentation

## 2017-10-22 DIAGNOSIS — M5442 Lumbago with sciatica, left side: Secondary | ICD-10-CM | POA: Insufficient documentation

## 2017-10-22 MED ORDER — LIDOCAINE 5 % EX PTCH: 1.0000 | MEDICATED_PATCH | CUTANEOUS | 0 refills | Status: DC

## 2017-10-22 MED ORDER — ACETAMINOPHEN 325 MG PO TABS
650.0000 mg | ORAL_TABLET | Freq: Once | ORAL | Status: AC
  Administered 2017-10-22: 650 mg via ORAL
  Filled 2017-10-22: qty 2

## 2017-10-22 NOTE — ED Triage Notes (Signed)
Pt c/o L lower back pain x several days, pt denies injury. Pt states he was seen in UC yesterday and given Meloxicam and patient states he took it this morning, took Ibuprofen last night, no relief. Pt started Gabapentin 2/3, but hasn't taken it for several days.

## 2017-10-22 NOTE — ED Notes (Signed)
Bed: WTR8 Expected date:  Expected time:  Means of arrival:  Comments: 

## 2017-10-22 NOTE — ED Triage Notes (Signed)
Pt also states he took Lorazepam today.

## 2017-10-22 NOTE — Discharge Instructions (Signed)
Workup has been normal. Please take medications as prescribed and instructed.  SEEK IMMEDIATE MEDICAL ATTENTION IF: New numbness, tingling, weakness, or problem with the use of your arms or legs.  Severe back pain not relieved with medications.  Change in bowel or bladder control.  Urinary retention.  Numbness in your groin.  Increasing pain in any areas of the body (such as chest or abdominal pain).  Shortness of breath, dizziness or fainting.  Nausea (feeling sick to your stomach), vomiting, fever, or sweats. 
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
## 2017-10-26 ENCOUNTER — Encounter (HOSPITAL_COMMUNITY): Payer: Self-pay | Admitting: Emergency Medicine

## 2017-10-26 ENCOUNTER — Other Ambulatory Visit: Payer: Self-pay

## 2017-10-26 ENCOUNTER — Emergency Department (HOSPITAL_COMMUNITY): Admission: EM | Admit: 2017-10-26 | Discharge: 2017-10-26 | Discharge status: Against Medical Advice | Payer: Self-pay

## 2017-10-26 ENCOUNTER — Ambulatory Visit (HOSPITAL_COMMUNITY)
Admission: EM | Admit: 2017-10-26 | Discharge: 2017-10-26 | Disposition: A | Discharge status: Home / Self Care | Payer: Self-pay | Attending: Family Medicine | Admitting: Family Medicine

## 2017-10-26 ENCOUNTER — Encounter (HOSPITAL_COMMUNITY): Payer: Self-pay | Admitting: *Deleted

## 2017-10-26 DIAGNOSIS — R42 Dizziness and giddiness: Secondary | ICD-10-CM

## 2017-10-26 NOTE — ED Triage Notes (Addendum)
Previous triage charted in error

## 2017-10-26 NOTE — ED Triage Notes (Signed)
PT C/O: dizziness onset 3 days... sts he is not sure if it's due to pain on LLE .... sts he did f/u w/orthopedic.    A&O x4... NAD... Ambulatory

## 2017-10-26 NOTE — Discharge Instructions (Signed)
You need to make an appointment with your regular doctor to discuss the dizziness that has been going on for a year.  We do not see any cause for the dizziness today.

## 2017-10-26 NOTE — ED Provider Notes (Signed)
[facility]   [id number] 10/26/17 Arrival Time: 1543   SUBJECTIVE:  Wesley [name] is a 52 y.o. male who presents to the urgent care with complaint of dizziness x 3 days.   Past Medical History:  Diagnosis Date  . Alcohol abuse   . Anxiety   . Anxiety   . Chronic neck pain   . Cocaine abuse (HCC)   . Diabetes mellitus without complication (HCC)   . Hypertension    Family History  Problem Relation Age of Onset  . Hypertension Mother   . Hypertension Father    Social History   Socioeconomic History  . Marital status: Significant Other    Spouse name: Not on file  . Number of children: Not on file  . Years of education: Not on file  . Highest education level: Not on file  Social Needs  . Financial resource strain: Not on file  . Food insecurity - worry: Not on file  . Food insecurity - inability: Not on file  . Transportation needs - medical: Not on file  . Transportation needs - non-medical: Not on file  Occupational History  . Not on file  Tobacco Use  . Smoking status: Never Smoker  . Smokeless tobacco: Never Used  Substance and Sexual Activity  . Alcohol use: No    Comment: former  . Drug use: No    Comment: denies, reports past history   . Sexual activity: Not on file  Other Topics Concern  . Not on file  Social History Narrative  . Not on file   Current Meds  Medication Sig  . amLODipine (NORVASC) 5 MG tablet Take 1.5 tablets (7.5 mg total) by mouth daily.  Marland Kitchen. gabapentin (NEURONTIN) 300 MG capsule Take 1 capsule (300 mg total) by mouth at bedtime.  Marland Kitchen. LORazepam (ATIVAN) 0.5 MG tablet Take 1 tablet (0.5 mg total) by mouth 2 (two) times daily. for anxiety  . meloxicam (MOBIC) 15 MG tablet Take 0.5-1 tablets (7.5-15 mg total) by mouth daily. Take with food. Do not take Ibuprofen, Goody's, or Aleve while taking this medication.  . methocarbamol (ROBAXIN) 500 MG tablet Take 1 tablet (500 mg total) by mouth 2 (two) times daily.  . pantoprazole  (PROTONIX) 20 MG tablet Take 1 tablet (20 mg total) by mouth daily.   Allergies  Allergen Reactions  . Ibuprofen Other (See Comments) and Rash    Makes his throat get "very dry" after taking it States he cant take it  . Other Other (See Comments)    Steroids: Patient received a steroid shot from [facility] ([id number]) [phone] and it resulted in his muscles twitching all over his body  . Zofran [Ondansetron Hcl] Other (See Comments)    Causes sweating and body "feels badly"      ROS: As per HPI, remainder of ROS negative.   OBJECTIVE:   Vitals:   10/26/17 1631  BP: (!) 138/91  Pulse: 99  Resp: 20  Temp: 98.3 F (36.8 C)  TempSrc: Oral  SpO2: 95%     General appearance: alert; no distress Eyes: PERRL; EOMI; conjunctiva normal HENT: normocephalic; atraumatic; TMs normal, canal normal, external ears normal without trauma; nasal mucosa normal; oral mucosa normal Neck: supple Lungs: clear to auscultation bilaterally Heart: regular rate and rhythm Abdomen: soft, non-tender; bowel sounds normal; no masses or organomegaly; no guarding or rebound tenderness Back: no CVA tenderness Extremities: no cyanosis or edema; symmetrical with no gross deformities Skin: warm and dry Neurologic:  normal gait; grossly normal Psychological: alert and cooperative; normal mood and affect      Labs:  Results for orders placed or performed during the hospital encounter of 09/30/17  CBC with Differential  Result Value Ref Range   WBC 7.9 4.0 - 10.5 K/uL   RBC 5.04 4.22 - 5.81 MIL/uL   Hemoglobin 13.6 13.0 - 17.0 g/dL   HCT 16.1 09.6 - 04.5 %   MCV 79.8 78.0 - 100.0 fL   MCH 27.0 26.0 - 34.0 pg   MCHC 33.8 30.0 - 36.0 g/dL   RDW 40.9 81.1 - 91.4 %   Platelets 273 150 - 400 K/uL   Neutrophils Relative % 73 %   Neutro Abs 5.8 1.7 - 7.7 K/uL   Lymphocytes Relative 11 %   Lymphs Abs 0.9 0.7 - 4.0 K/uL   Monocytes Relative 15 %   Monocytes Absolute 1.2 (H) 0.1 - 1.0 K/uL    Eosinophils Relative 1 %   Eosinophils Absolute 0.1 0.0 - 0.7 K/uL   Basophils Relative 0 %   Basophils Absolute 0.0 0.0 - 0.1 K/uL  Basic metabolic panel  Result Value Ref Range   Sodium 132 (L) 135 - 145 mmol/L   Potassium 3.5 3.5 - 5.1 mmol/L   Chloride 99 (L) 101 - 111 mmol/L   CO2 25 22 - 32 mmol/L   Glucose, Bld 107 (H) 65 - 99 mg/dL   BUN 8 6 - 20 mg/dL   Creatinine, Ser 7.82 0.61 - 1.24 mg/dL   Calcium 8.8 (L) 8.9 - 10.3 mg/dL   GFR calc non Af Amer >60 >60 mL/min   GFR calc Af Amer >60 >60 mL/min   Anion gap 8 5 - 15    Labs Reviewed - No data to display  No results found.     ASSESSMENT & PLAN:  1. Dizziness     No orders of the defined types were placed in this encounter.   Reviewed expectations re: course of current medical issues. Questions answered. Outlined signs and symptoms indicating need for more acute intervention. Patient verbalized understanding. After Visit Summary given.    Procedures:      Elvina Sidle, MD 10/26/17 [phone]

## 2017-10-29 ENCOUNTER — Ambulatory Visit: Payer: Self-pay | Admitting: Internal Medicine

## 2017-10-31 ENCOUNTER — Other Ambulatory Visit: Payer: Self-pay

## 2017-10-31 ENCOUNTER — Encounter (HOSPITAL_COMMUNITY): Payer: Self-pay | Admitting: Emergency Medicine

## 2017-10-31 ENCOUNTER — Ambulatory Visit (HOSPITAL_COMMUNITY)
Admission: EM | Admit: 2017-10-31 | Discharge: 2017-10-31 | Disposition: A | Discharge status: Home / Self Care | Payer: Self-pay | Attending: Family Medicine | Admitting: Family Medicine

## 2017-10-31 DIAGNOSIS — M62838 Other muscle spasm: Secondary | ICD-10-CM

## 2017-10-31 MED ORDER — CYCLOBENZAPRINE HCL 10 MG PO TABS: 10.0000 mg | ORAL_TABLET | Freq: Three times a day (TID) | ORAL | 0 refills | Status: DC

## 2017-10-31 MED FILL — ?AMLODIPINE BESYLATE 5 MG T: 5 MG | 30 days supply | Qty: 45 | Fill #2

## 2017-10-31 NOTE — ED Triage Notes (Signed)
Patient complains of neck and upper back twitching since 6 this morning and feeling dizzy

## 2017-11-02 ENCOUNTER — Encounter (HOSPITAL_COMMUNITY): Payer: Self-pay | Admitting: Emergency Medicine

## 2017-11-02 ENCOUNTER — Other Ambulatory Visit: Payer: Self-pay

## 2017-11-02 ENCOUNTER — Emergency Department (HOSPITAL_COMMUNITY)
Admission: EM | Admit: 2017-11-02 | Discharge: 2017-11-02 | Disposition: A | Discharge status: Home / Self Care | Payer: Self-pay | Attending: Emergency Medicine | Admitting: Emergency Medicine

## 2017-11-02 DIAGNOSIS — I10 Essential (primary) hypertension: Secondary | ICD-10-CM | POA: Insufficient documentation

## 2017-11-02 DIAGNOSIS — Z79899 Other long term (current) drug therapy: Secondary | ICD-10-CM | POA: Insufficient documentation

## 2017-11-02 DIAGNOSIS — R109 Unspecified abdominal pain: Secondary | ICD-10-CM | POA: Insufficient documentation

## 2017-11-02 DIAGNOSIS — R110 Nausea: Secondary | ICD-10-CM

## 2017-11-02 DIAGNOSIS — Z5321 Procedure and treatment not carried out due to patient leaving prior to being seen by health care provider: Secondary | ICD-10-CM | POA: Insufficient documentation

## 2017-11-02 DIAGNOSIS — R112 Nausea with vomiting, unspecified: Secondary | ICD-10-CM | POA: Insufficient documentation

## 2017-11-02 DIAGNOSIS — E119 Type 2 diabetes mellitus without complications: Secondary | ICD-10-CM | POA: Insufficient documentation

## 2017-11-02 LAB — CBC
HCT: 42.9 % (ref 39.0–52.0)
Hemoglobin: 14.3 g/dL (ref 13.0–17.0)
MCH: 27 pg (ref 26.0–34.0)
MCHC: 33.3 g/dL (ref 30.0–36.0)
MCV: 81.1 fL (ref 78.0–100.0)
Platelets: 248 10*3/uL (ref 150–400)
RBC: 5.29 MIL/uL (ref 4.22–5.81)
RDW: 14.3 % (ref 11.5–15.5)
WBC: 10.5 10*3/uL (ref 4.0–10.5)

## 2017-11-02 LAB — COMPREHENSIVE METABOLIC PANEL
ALT: 27 U/L (ref 17–63)
AST: 30 U/L (ref 15–41)
Albumin: 4.1 g/dL (ref 3.5–5.0)
Alkaline Phosphatase: 76 U/L (ref 38–126)
Anion gap: 13 (ref 5–15)
BUN: 9 mg/dL (ref 6–20)
CO2: 22 mmol/L (ref 22–32)
Calcium: 8.8 mg/dL — ABNORMAL LOW (ref 8.9–10.3)
Chloride: 101 mmol/L (ref 101–111)
Creatinine, Ser: 0.71 mg/dL (ref 0.61–1.24)
GFR calc Af Amer: >60 mL/min (ref >60)
GFR calc non Af Amer: >60 mL/min (ref >60)
Glucose, Bld: 111 mg/dL — ABNORMAL HIGH (ref 65–99)
Potassium: 3.7 mmol/L (ref 3.5–5.1)
Sodium: 136 mmol/L (ref 135–145)
Total Bilirubin: 0.6 mg/dL (ref 0.3–1.2)
Total Protein: 7.7 g/dL (ref 6.5–8.1)

## 2017-11-02 LAB — LIPASE, BLOOD: Lipase: 37 U/L (ref 11–51)

## 2017-11-02 MED ORDER — PROMETHAZINE HCL 25 MG PO TABS: 25.0000 mg | ORAL_TABLET | Freq: Four times a day (QID) | ORAL | 0 refills | Status: DC | PRN

## 2017-11-02 MED ORDER — TRAMADOL HCL 50 MG PO TABS
50.0000 mg | ORAL_TABLET | Freq: Once | ORAL | Status: AC
  Administered 2017-11-02: 50 mg via ORAL
  Filled 2017-11-02: qty 1

## 2017-11-02 MED ORDER — PROMETHAZINE HCL 25 MG/ML IJ SOLN
25.0000 mg | Freq: Once | INTRAMUSCULAR | Status: AC
  Administered 2017-11-02: 25 mg via INTRAMUSCULAR
  Filled 2017-11-02: qty 1

## 2017-11-02 NOTE — ED Notes (Signed)
Patient has bloodwork from earlier tonight.

## 2017-11-02 NOTE — ED Provider Notes (Signed)
[facility] EMERGENCY DEPARTMENT Provider Note   CSN: [id number] Arrival date & time: 11/02/17  4098     History   Chief Complaint Chief Complaint  Patient presents with  . Abdominal Pain  . Emesis  . Nausea    HPI Wesley [name] is a 52 y.o. male.  Mid right abdominal pain with associated nausea and vomiting after eating yesterday at a [employer].  He apparently also started a new medication fluoxetine.  No fever, sweats, chills, chest pain, dyspnea, dysuria.  Severity is mild.  Nothing makes symptoms better or worse      Past Medical History:  Diagnosis Date  . Alcohol abuse   . Anxiety   . Anxiety   . Chronic neck pain   . Cocaine abuse (HCC)   . Diabetes mellitus without complication (HCC)   . Hypertension     Patient Active Problem List   Diagnosis Date Noted  . Legally blind in right eye, as defined in [place] 05/02/2017  . Prediabetes 04/23/2017  . Muscle spasm 04/23/2017  . Essential hypertension 04/16/2017  . H/O medication noncompliance 04/02/2017  . Vitamin D deficiency 04/02/2017  . DJD (degenerative joint disease) of cervical spine 03/16/2017  . 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 Dizziness 01/23/2017  . Anxiety 11/21/2016    Past Surgical History:  Procedure Laterality Date  . EYE SURGERY Right ~ 1980   "hit my eye"       Home Medications    Prior to Admission medications   Medication Sig Start Date End Date Taking? Authorizing Provider  acetaminophen (TYLENOL) 500 MG tablet Take 1,000 mg by mouth daily as needed (back pain).    [provider]  acetaminophen-codeine 120-12 MG/5ML solution Take 5 mLs by mouth at bedtime as needed for cough. 09/30/17   [provider]  amLODipine (NORVASC) 5 MG tablet Take 1.5 tablets (7.5 mg total) by mouth daily. 07/03/17   Marcine Matar, MD  cyclobenzaprine (FLEXERIL) 10 MG tablet Take 1 tablet (10 mg total) by mouth 3 (three) times daily. 10/31/17   Mardella Layman, MD    gabapentin (NEURONTIN) 300 MG capsule Take 1 capsule (300 mg total) by mouth at bedtime. 10/14/17   Elvina Sidle, MD  lidocaine (LIDODERM) 5 % Place 1 patch onto the skin daily. Remove & Discard patch within 12 hours or as directed by MD 10/22/17   Demetrios Loll T, PA-C  LORazepam (ATIVAN) 0.5 MG tablet Take 1 tablet (0.5 mg total) by mouth 2 (two) times daily. for anxiety 08/05/17   Elvina Sidle, MD  meloxicam (MOBIC) 15 MG tablet Take 0.5-1 tablets (7.5-15 mg total) by mouth daily. Take with food. Do not take Ibuprofen, Goody's, or Aleve while taking this medication. 10/21/17 11/20/17  Ofilia Neas, PA-C  methocarbamol (ROBAXIN) 500 MG tablet Take 1 tablet (500 mg total) by mouth 2 (two) times daily. 10/14/17   Joy, Shawn C, PA-C  pantoprazole (PROTONIX) 20 MG tablet Take 1 tablet (20 mg total) by mouth daily. 10/11/17 11/10/17  Georgetta Haber, NP  promethazine (PHENERGAN) 25 MG tablet Take 1 tablet (25 mg total) by mouth every 6 (six) hours as needed for nausea or vomiting. 11/02/17   Donnetta Hutching, MD    Family History Family History  Problem Relation Age of Onset  . Hypertension Mother   . Hypertension Father     Social History Social History   Tobacco Use  . Smoking status: Never Smoker  . Smokeless tobacco: Never Used  Substance Use Topics  . Alcohol use: No    Comment: former  . Drug use: No    Comment: denies, reports past history      Allergies   Ibuprofen; Other; and Zofran [ondansetron hcl]   Review of Systems Review of Systems  All other systems reviewed and are negative.    Physical Exam Updated Vital Signs BP 120/88   Pulse (!) 104   Temp 98.4 F (36.9 C) (Oral)   Resp 19   SpO2 96%   Physical Exam  Constitutional: He is oriented to person, place, and time. He appears well-developed and well-nourished.  nad  HENT:  Head: Normocephalic and atraumatic.  Eyes: Conjunctivae are normal.  Neck: Neck supple.  Cardiovascular: Normal rate and  regular rhythm.  Pulmonary/Chest: Effort normal and breath sounds normal.  Abdominal: Soft. Bowel sounds are normal.  Musculoskeletal: Normal range of motion.  Neurological: He is alert and oriented to person, place, and time.  Skin: Skin is warm and dry.  Psychiatric: He has a normal mood and affect. His behavior is normal.  Nursing note and vitals reviewed.    ED Treatments / Results  Labs (all labs ordered are listed, but only abnormal results are displayed) Labs Reviewed - No data to display  EKG  EKG Interpretation None       Radiology No results found.  Procedures Procedures (including critical care time)  Medications Ordered in ED Medications  promethazine (PHENERGAN) injection 25 mg (25 mg Intramuscular Given 11/02/17 0813)  traMADol (ULTRAM) tablet 50 mg (50 mg Oral Given 11/02/17 [id number])     Initial Impression / Assessment and Plan / ED Course  I have reviewed the triage vital signs and the nursing notes.  Pertinent labs & imaging results that were available during my care of the patient were reviewed by me and considered in my medical decision making (see chart for details).     Patient is a regular visitor to the emergency department.  No acute abdomen on physical exam.  He is minimally tachycardic.  Otherwise vital signs are stable.  He did not want blood work done.  Phenergan 25 mg given in the ED.  Will discharge with same medication.  Final Clinical Impressions(s) / ED Diagnoses   Final diagnoses:  Abdominal pain, unspecified abdominal location    ED Discharge Orders        Ordered    promethazine (PHENERGAN) 25 MG tablet  Every 6 hours PRN     11/02/17 1103       Donnetta Hutchingook, Brian, MD 11/02/17 [phone]

## 2017-11-02 NOTE — Discharge Instructions (Signed)
Clear liquids today.  Prescription for nausea medicine.

## 2017-11-02 NOTE — ED Provider Notes (Signed)
I did not see or evaluate the patient.  The patient left the emergency department prior to my evaluation.   Azalia Bilisampos, Kevin, MD 11/02/17 [phone]

## 2017-11-02 NOTE — ED Triage Notes (Signed)
Patient returns from being in his car with nausea, vomiting and abdominal pain.  He states that it started after taking fluoxetine.  Patient states that his doctor wanted him to start the fluoxetine and stop the lorazepam but his stomach continues to be upset with nausea and vomiting.

## 2017-11-02 NOTE — ED Triage Notes (Signed)
Patient here with abdominal pain after starting fluoxetine.  Patient states that his doctor wanted him to start the fluoxetine and stop the lorazepam but his stomach is upset.

## 2017-11-02 NOTE — ED Notes (Addendum)
Patient refusing blood work. edp aware

## 2017-11-05 NOTE — ED Provider Notes (Signed)
[facility]   [id number] 10/31/17 Arrival Time: 1624  ASSESSMENT & PLAN:  1. Muscle spasms of neck     Meds ordered this encounter  Medications  . cyclobenzaprine (FLEXERIL) 10 MG tablet    Sig: Take 1 tablet (10 mg total) by mouth 3 (three) times daily.    Dispense:  20 tablet    Refill:  0   This is a long standing complaint. Discussed and reassured.  Reviewed expectations re: course of current medical issues. Questions answered. Outlined signs and symptoms indicating need for more acute intervention. Patient verbalized understanding. After Visit Summary given.   SUBJECTIVE:  Wesley [name] is a 52 y.o. male who presents complaining of "twitching in my neck" since this morning. H/O muscle spasms and reports this is the same. "Did feel a little dizzy for a second because I was nervous. Haven't felt any since. No new injury. Spasms do not limit him. No extremity sensation changes or weakness. No specific aggravating or alleviating factors reported. No OTC treatment. No HA or visual changes.  ROS: As per HPI.   OBJECTIVE:  Vitals:   10/31/17 1715  BP: 125/81  Pulse: 92  Resp: 18  Temp: 98 F (36.7 C)  TempSrc: Oral  SpO2: 99%    General appearance: alert; no distress Eyes: PERRLA; EOMI; conjunctiva normal HENT: normocephalic; atraumatic Neck: supple with FROM; he does report mild tenderness over neck musculature; no midline tenderness Lungs: clear to auscultation bilaterally Extremities: no cyanosis or edema; symmetrical with no gross deformities Skin: warm and dry Neurologic: normal gait; normal symmetric reflexes; CN 2-12 grossly intact Psychological: alert and cooperative; normal mood and affect   Allergies  Allergen Reactions  . Ibuprofen Other (See Comments) and Rash    Makes his throat get "very dry" after taking it States he cant take it  . Other Other (See Comments)    Steroids: Patient received a steroid shot from [facility]  ([id number]) [phone] and it resulted in his muscles twitching all over his body  . Zofran [Ondansetron Hcl] Other (See Comments)    Causes sweating and body "feels badly"    Past Medical History:  Diagnosis Date  . Alcohol abuse   . Anxiety   . Anxiety   . Chronic neck pain   . Cocaine abuse (HCC)   . Diabetes mellitus without complication (HCC)   . Hypertension    Social History   Socioeconomic History  . Marital status: Significant Other    Spouse name: Not on file  . Number of children: Not on file  . Years of education: Not on file  . Highest education level: Not on file  Social Needs  . Financial resource strain: Not on file  . Food insecurity - worry: Not on file  . Food insecurity - inability: Not on file  . Transportation needs - medical: Not on file  . Transportation needs - non-medical: Not on file  Occupational History  . Not on file  Tobacco Use  . Smoking status: Never Smoker  . Smokeless tobacco: Never Used  Substance and Sexual Activity  . Alcohol use: No    Comment: former  . Drug use: No    Comment: denies, reports past history   . Sexual activity: Not on file  Other Topics Concern  . Not on file  Social History Narrative  . Not on file   Family History  Problem Relation Age of Onset  . Hypertension Mother   . Hypertension  Father    Past Surgical History:  Procedure Laterality Date  . EYE SURGERY Right ~ 1980   "hit my eye"      Mardella LaymanHagler, Brian, MD 11/05/17 [phone]

## 2017-11-29 ENCOUNTER — Encounter (HOSPITAL_COMMUNITY): Payer: Self-pay | Admitting: Family Medicine

## 2017-11-29 ENCOUNTER — Ambulatory Visit (HOSPITAL_COMMUNITY)
Admission: EM | Admit: 2017-11-29 | Discharge: 2017-11-29 | Disposition: A | Discharge status: Home / Self Care | Payer: Self-pay | Attending: Family Medicine | Admitting: Family Medicine

## 2017-11-29 DIAGNOSIS — R6889 Other general symptoms and signs: Secondary | ICD-10-CM

## 2017-11-29 NOTE — ED Notes (Signed)
Patient discharged by provider.

## 2017-11-29 NOTE — ED Triage Notes (Signed)
Pt here for the sensation of "hot" in his head and dizziness since this am/

## 2017-12-04 NOTE — ED Provider Notes (Signed)
  [facility]   [id number] 11/29/17 Arrival Time: 1809  ASSESSMENT & PLAN:  1. Sensation of feeling hot    Reassured. I think Trammell worries excessively that he will, at some point, have a stroke. + FH of stroke. Reassured that he is not having any symptoms of stroke. Normal PE. He voices understanding. May f/u as needed.  Reviewed expectations re: course of current medical issues. Questions answered. Outlined signs and symptoms indicating need for more acute intervention. Patient verbalized understanding. After Visit Summary given.  SUBJECTIVE: History from: patient. Johnnette LitterJose Reitano is a 52 y.o. male who presents with complaint of "feeling a hot sensation in my head this morning." On/off a few times. No symptoms for the past few hours. Able to do normal daily activities today. No extremity sensation changes or weakness. No aphasia or ataxia. No n/v. No visual problems. Describes transient episodes lasting a few minutes then resolving. "Feels like a hot sensation moves over the top of my head." Occasional lightheaded feeling but he as had this on and off for many years. No HA.  ROS: As per HPI.  OBJECTIVE:  Vitals:   11/29/17 1844  BP: (!) 124/91  Pulse: 89  Resp: 18  Temp: 98.1 F (36.7 C)  SpO2: 100%     Glascow Coma Scale: 15  General appearance: alert; no distress HEENT: normocephalic; atraumatic; conjunctivae normal; TMs normal; oral mucosa normal Neck: supple with FROM; no midline cervical tenderness or deformity; no anterior mass or crepitus; trachea midline Lungs: clear to auscultation bilaterally Heart: regular rate and rhythm Back: FROM at hips Extremities: moves all extremities normally; no cyanosis or edema; symmetrical with no gross deformities Skin: warm and dry Neurologic: normal gait; CN 2-12 intact; normal extremity strength and sensation Psychological: alert and cooperative; normal mood and affect    Allergies  Allergen Reactions  . Ibuprofen  Other (See Comments) and Rash    Makes his throat get "very dry" after taking it States he cant take it  . Other Other (See Comments)    Steroids: Patient received a steroid shot from [facility] ([id number]) [phone] and it resulted in his muscles twitching all over his body  . Zofran [Ondansetron Hcl] Other (See Comments)    Causes sweating and body "feels badly"   Past Medical History:  Diagnosis Date  . Alcohol abuse   . Anxiety   . Anxiety   . Chronic neck pain   . Cocaine abuse (HCC)   . Diabetes mellitus without complication (HCC)   . Hypertension    Past Surgical History:  Procedure Laterality Date  . EYE SURGERY Right ~ 1980   "hit my eye"   Family History  Problem Relation Age of Onset  . Hypertension Mother   . Hypertension Father            Mardella LaymanHagler, Brian, MD 12/04/17 [phone]

## 2017-12-12 ENCOUNTER — Telehealth: Payer: Self-pay | Admitting: Internal Medicine

## 2017-12-12 NOTE — Telephone Encounter (Signed)
Pt. Called stating that he had received a call. Pt. Was told that in the system it showed that on one had called him. Pt. Requested an appt. And was told that because of his high no show rate he was on the walk in basis.

## 2017-12-14 ENCOUNTER — Encounter (HOSPITAL_COMMUNITY): Payer: Self-pay | Admitting: Emergency Medicine

## 2017-12-14 ENCOUNTER — Ambulatory Visit: Payer: Self-pay | Admitting: Internal Medicine

## 2017-12-14 ENCOUNTER — Other Ambulatory Visit: Payer: Self-pay

## 2017-12-14 ENCOUNTER — Ambulatory Visit (HOSPITAL_COMMUNITY)
Admission: EM | Admit: 2017-12-14 | Discharge: 2017-12-14 | Disposition: A | Discharge status: Home / Self Care | Payer: Self-pay | Attending: Family Medicine | Admitting: Family Medicine

## 2017-12-14 DIAGNOSIS — M62838 Other muscle spasm: Secondary | ICD-10-CM

## 2017-12-14 MED ORDER — CYCLOBENZAPRINE HCL 10 MG PO TABS: 10.0000 mg | ORAL_TABLET | Freq: Two times a day (BID) | ORAL | 0 refills | Status: DC | PRN

## 2017-12-14 NOTE — ED Provider Notes (Signed)
[facility]    CSN: [id number] Arrival date & time: 12/14/17  1636     History   Chief Complaint Chief Complaint  Patient presents with  . Back Pain    HPI Wesley [name] is a 52 y.o. male.   52 yo male here for neck spasm and muscles spasm in his chest. He says it is worse at night and when he moves his neck. Has tried OTC pain medicine and has not helped.      Past Medical History:  Diagnosis Date  . Alcohol abuse   . Anxiety   . Anxiety   . Chronic neck pain   . Cocaine abuse (HCC)   . Diabetes mellitus without complication (HCC)   . Hypertension     Patient Active Problem List   Diagnosis Date Noted  . 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 Prediabetes 04/23/2017  . Muscle spasm 04/23/2017  . Essential hypertension 04/16/2017  . H/O medication noncompliance 04/02/2017  . Vitamin D deficiency 04/02/2017  . DJD (degenerative joint disease) of cervical spine 03/16/2017  . 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 Dizziness 01/23/2017  . Anxiety 11/21/2016    Past Surgical History:  Procedure Laterality Date  . EYE SURGERY Right ~ 1980   "hit my eye"       Home Medications    Prior to Admission medications   Medication Sig Start Date End Date Taking? Authorizing Provider  acetaminophen (TYLENOL) 500 MG tablet Take 1,000 mg by mouth daily as needed (back pain).    [provider]  acetaminophen-codeine 120-12 MG/5ML solution Take 5 mLs by mouth at bedtime as needed for cough. 09/30/17   [provider]  amLODipine (NORVASC) 5 MG tablet Take 1.5 tablets (7.5 mg total) by mouth daily. 07/03/17   Marcine MatarJohnson, Deborah B, MD  cyclobenzaprine (FLEXERIL) 10 MG tablet Take 1 tablet (10 mg total) by mouth 3 (three) times daily. 10/31/17   Mardella LaymanHagler, Brian, MD  gabapentin (NEURONTIN) 300 MG capsule Take 1 capsule (300 mg total) by mouth at bedtime. 10/14/17   Elvina SidleLauenstein, Kurt, MD  lidocaine (LIDODERM) 5 % Place 1 patch onto the skin daily.  Remove & Discard patch within 12 hours or as directed by MD 10/22/17   Demetrios LollLeaphart, Kenneth T, PA-C  LORazepam (ATIVAN) 0.5 MG tablet Take 1 tablet (0.5 mg total) by mouth 2 (two) times daily. for anxiety 08/05/17   Elvina SidleLauenstein, Kurt, MD  methocarbamol (ROBAXIN) 500 MG tablet Take 1 tablet (500 mg total) by mouth 2 (two) times daily. 10/14/17   Joy, Shawn C, PA-C  pantoprazole (PROTONIX) 20 MG tablet Take 1 tablet (20 mg total) by mouth daily. 10/11/17 11/10/17  Georgetta HaberBurky, Natalie B, NP  promethazine (PHENERGAN) 25 MG tablet Take 1 tablet (25 mg total) by mouth every 6 (six) hours as needed for nausea or vomiting. 11/02/17   Donnetta Hutchingook, Brian, MD    Family History Family History  Problem Relation Age of Onset  . Hypertension Mother   . Hypertension Father     Social History Social History   Tobacco Use  . Smoking status: Never Smoker  . Smokeless tobacco: Never Used  Substance Use Topics  . Alcohol use: No    Comment: former  . Drug use: No    Comment: denies, reports past history      Allergies   Ibuprofen; Other; and Zofran [ondansetron hcl]   Review of Systems Review of Systems  Constitutional: Negative for activity change and appetite  change.  HENT: Negative for congestion and ear pain.   Eyes: Negative for discharge.  Respiratory: Negative for apnea and choking.   Cardiovascular: Negative for chest pain and leg swelling.  Gastrointestinal: Negative for abdominal distention and abdominal pain.  Endocrine: Negative for cold intolerance and heat intolerance.  Genitourinary: Negative for difficulty urinating and dysuria.  Musculoskeletal: Positive for back pain. Negative for arthralgias.  Neurological: Negative for dizziness and headaches.  Hematological: Negative for adenopathy. Does not bruise/bleed easily.     Physical Exam Triage Vital Signs ED Triage Vitals  Enc Vitals Group     BP 12/14/17 1742 116/80     Pulse Rate 12/14/17 1742 95     Resp --      Temp 12/14/17 1742 98.2 F  (36.8 C)     Temp Source 12/14/17 1742 Oral     SpO2 12/14/17 1742 97 %     Weight --      Height --      Head Circumference --      Peak Flow --      Pain Score 12/14/17 1743 8     Pain Loc --      Pain Edu? --      Excl. in GC? --    No data found.  Updated Vital Signs BP 116/80 (BP Location: Right Arm)   Pulse 95   Temp 98.2 F (36.8 C) (Oral)   SpO2 97%   Visual Acuity Right Eye Distance:   Left Eye Distance:   Bilateral Distance:    Right Eye Near:   Left Eye Near:    Bilateral Near:     Physical Exam  Constitutional: He is oriented to person, place, and time. He appears well-developed and well-nourished.  HENT:  Head: Normocephalic and atraumatic.  Eyes: Pupils are equal, round, and reactive to light. EOM are normal.  Neck: Normal range of motion. Neck supple.  Cardiovascular: Normal rate and intact distal pulses.  Pulmonary/Chest: Effort normal. No respiratory distress.  Musculoskeletal: Normal range of motion. He exhibits no edema.  Neurological: He is alert and oriented to person, place, and time.  Skin: Skin is warm and dry.  Psychiatric: He has a normal mood and affect. His behavior is normal.     UC Treatments / Results  Labs (all labs ordered are listed, but only abnormal results are displayed) Labs Reviewed - No data to display  EKG None Radiology No results found.  Procedures Procedures (including critical care time)  Medications Ordered in UC Medications - No data to display   Initial Impression / Assessment and Plan / UC Course  I have reviewed the triage vital signs and the nursing notes.  Pertinent labs & imaging results that were available during my care of the patient were reviewed by me and considered in my medical decision making (see chart for details).     1. Muscle spasm- treat with muscle relaxant and warned patient of side effects including sleepiness and increased risk of falls.  Final Clinical Impressions(s) / UC  Diagnoses   Final diagnoses:  None    ED Discharge Orders    None       Controlled Substance Prescriptions [place] Controlled Substance Registry consulted? Not Applicable   Rolm Bookbinder, DO 12/14/17 1810

## 2017-12-14 NOTE — ED Triage Notes (Signed)
States was using a hammer today and he thinks he pulled a muscle in his back and chest

## 2017-12-18 ENCOUNTER — Ambulatory Visit (HOSPITAL_COMMUNITY)
Admission: EM | Admit: 2017-12-18 | Discharge: 2017-12-18 | Disposition: A | Discharge status: Home / Self Care | Payer: Self-pay | Attending: Family Medicine | Admitting: Family Medicine

## 2017-12-18 ENCOUNTER — Encounter (HOSPITAL_COMMUNITY): Payer: Self-pay | Admitting: Emergency Medicine

## 2017-12-18 DIAGNOSIS — M79601 Pain in right arm: Secondary | ICD-10-CM

## 2017-12-18 DIAGNOSIS — M79602 Pain in left arm: Secondary | ICD-10-CM

## 2017-12-18 DIAGNOSIS — R202 Paresthesia of skin: Secondary | ICD-10-CM

## 2017-12-18 MED ORDER — PREDNISONE 10 MG (21) PO TBPK: ORAL_TABLET | Freq: Every day | ORAL | 0 refills | Status: DC

## 2017-12-18 NOTE — ED Triage Notes (Signed)
Pt here for his chronic dizziness. Ambulatory with steady gait.

## 2017-12-19 NOTE — ED Provider Notes (Signed)
[facility]   [id number] 12/18/17 Arrival Time: 1515  ASSESSMENT & PLAN:  1. Paresthesia and pain of both upper extremities   -transient  Meds ordered this encounter  Medications  . predniSONE (STERAPRED UNI-PAK 21 TAB) 10 MG (21) TBPK tablet    Sig: Take by mouth daily. Take as directed.    Dispense:  21 tablet    Refill:  0   Discussed possible causes with Artyom today. Reassured this does not sound dangerous. Work not given for 72 hours. Trial of prednisone. He will f/u with [place] if not seeing improvement over the next few days, sooner if needed.  Reviewed expectations re: course of current medical issues. Questions answered. Outlined signs and symptoms indicating need for more acute intervention. Patient verbalized understanding. After Visit Summary given.  SUBJECTIVE: History from: patient. Wesley [name] is a 52 y.o. male who has a long standing history of neck muscle spasms. Reports being at work today when he started to feel "tingling in my hands". On/off. No specific aggravating or alleviating factors reported. Questions having similar symptoms in the past but cannot remember. Currently without symptoms. No dizziness or lightheadedness. No headaches.  ROS: As per HPI.   OBJECTIVE:  Vitals:   12/18/17 1534  BP: 138/90  Pulse: (!) 105  Resp: 18  Temp: 97.9 F (36.6 C)  SpO2: 100%    General appearance: alert; no distress Extremities: no cyanosis or edema; symmetrical with no gross deformities; both upper extremities with normal exam Neck: FROM without midline tenderness CV: normal extremity capillary refill Skin: warm and dry Neurologic: normal gait; normal symmetric reflexes in all extremities; normal sensation in all extremities Psychological: alert and cooperative; normal mood and affect  Allergies  Allergen Reactions  . Ibuprofen Other (See Comments) and Rash    Makes his throat get "very dry" after taking it States he cant take it  . Other Other  (See Comments)    Steroids: Patient received a steroid shot from [facility] [phone] and it resulted in his muscles twitching all over his body  . Zofran [Ondansetron Hcl] Other (See Comments)    Causes sweating and body "feels badly"    Past Medical History:  Diagnosis Date  . Alcohol abuse   . Anxiety   . Anxiety   . Chronic neck pain   . Cocaine abuse (HCC)   . Diabetes mellitus without complication (HCC)   . Hypertension    Social History   Socioeconomic History  . Marital status: Significant Other    Spouse name: Not on file  . Number of children: Not on file  . Years of education: Not on file  . Highest education level: Not on file  Occupational History  . Not on file  Social Needs  . Financial resource strain: Not on file  . Food insecurity:    Worry: Not on file    Inability: Not on file  . Transportation needs:    Medical: Not on file    Non-medical: Not on file  Tobacco Use  . Smoking status: Never Smoker  . Smokeless tobacco: Never Used  Substance and Sexual Activity  . Alcohol use: No    Comment: former  . Drug use: No    Comment: denies, reports past history   . Sexual activity: Not on file  Lifestyle  . Physical activity:    Days per week: Not on file    Minutes per session: Not on file  . Stress: Not  on file  Relationships  . Social connections:    Talks on phone: Not on file    Gets together: Not on file    Attends religious service: Not on file    Active member of club or organization: Not on file    Attends meetings of clubs or organizations: Not on file    Relationship status: Not on file  . Intimate partner violence:    Fear of current or ex partner: Not on file    Emotionally abused: Not on file    Physically abused: Not on file    Forced sexual activity: Not on file  Other Topics Concern  . Not on file  Social History Narrative  . Not on file   Family History  Problem Relation Age of Onset  . Hypertension  Mother   . Hypertension Father    Past Surgical History:  Procedure Laterality Date  . EYE SURGERY Right ~ 1980   "hit my eye"      Mardella LaymanHagler, Brian, MD 12/19/17 1000

## 2017-12-30 ENCOUNTER — Other Ambulatory Visit: Payer: Self-pay

## 2017-12-30 ENCOUNTER — Encounter (HOSPITAL_COMMUNITY): Payer: Self-pay | Admitting: *Deleted

## 2017-12-30 ENCOUNTER — Encounter (HOSPITAL_COMMUNITY): Payer: Self-pay | Admitting: Emergency Medicine

## 2017-12-30 ENCOUNTER — Emergency Department (HOSPITAL_COMMUNITY)
Admission: EM | Admit: 2017-12-30 | Discharge: 2017-12-30 | Disposition: A | Discharge status: Home / Self Care | Payer: Self-pay | Attending: Emergency Medicine | Admitting: Emergency Medicine

## 2017-12-30 ENCOUNTER — Ambulatory Visit (HOSPITAL_COMMUNITY)
Admission: EM | Admit: 2017-12-30 | Discharge: 2017-12-30 | Disposition: A | Discharge status: Home / Self Care | Payer: Self-pay | Attending: Physician Assistant | Admitting: Physician Assistant

## 2017-12-30 DIAGNOSIS — M542 Cervicalgia: Secondary | ICD-10-CM

## 2017-12-30 DIAGNOSIS — M62838 Other muscle spasm: Secondary | ICD-10-CM

## 2017-12-30 DIAGNOSIS — Z5321 Procedure and treatment not carried out due to patient leaving prior to being seen by health care provider: Secondary | ICD-10-CM | POA: Insufficient documentation

## 2017-12-30 MED ORDER — CYCLOBENZAPRINE HCL 10 MG PO TABS: 10.0000 mg | ORAL_TABLET | Freq: Three times a day (TID) | ORAL | 0 refills | Status: DC

## 2017-12-30 MED ORDER — TRAMADOL HCL 50 MG PO TABS: 50.0000 mg | ORAL_TABLET | Freq: Four times a day (QID) | ORAL | 0 refills | Status: DC | PRN

## 2017-12-30 MED ORDER — CYCLOBENZAPRINE HCL 10 MG PO TABS: 5.0000 mg | ORAL_TABLET | Freq: Three times a day (TID) | ORAL | 0 refills | Status: DC | PRN

## 2017-12-30 MED ORDER — MELOXICAM 15 MG PO TABS: 7.5000 mg | ORAL_TABLET | Freq: Every day | ORAL | 0 refills | Status: AC

## 2017-12-30 NOTE — ED Triage Notes (Signed)
C/o dorsal neck pain upon waking this AM

## 2017-12-30 NOTE — ED Notes (Signed)
Pt called for answer x 2, no answer

## 2017-12-30 NOTE — ED Notes (Signed)
No answer in WR

## 2017-12-30 NOTE — ED Provider Notes (Signed)
Attempted to evaluate patient. He is not found in the lobby at this time   Wesley [name], Kevin, MD 12/30/17 2342

## 2017-12-30 NOTE — Discharge Instructions (Signed)
Start the medication today.  Hopefully you will feel better in the next few days.

## 2017-12-30 NOTE — ED Triage Notes (Signed)
Pt stated "I went to the clinic today.  They gave me muscle relaxer and pain meds but it's not helping."

## 2017-12-30 NOTE — ED Notes (Signed)
Bed: WTR5 Expected date:  Expected time:  Means of arrival:  Comments: 

## 2017-12-30 NOTE — ED Notes (Signed)
Pt called for room x 1, no answer.  

## 2017-12-30 NOTE — ED Provider Notes (Signed)
12/30/2017 4:53 PM   DOB: 02-13-1966 / MRN: [id number]  SUBJECTIVE:  Wesley [name] is a 52 y.o. male presenting for right-sided neck pain with radicular pattern to the right trapezius muscle.  Patient states that he woke up with a "crick in the neck" 2 days ago and has been worsening since.  He denies weakness of the upper extremity.  Denies headache and vision changes.  He has a long history of musculoskeletal back pain.  He is allergic to ibuprofen; other; and zofran [ondansetron hcl].   He  has a past medical history of Alcohol abuse, Anxiety, Anxiety, Chronic neck pain, Cocaine abuse (HCC), Diabetes mellitus without complication (HCC), and Hypertension.    He  reports that he has never smoked. He has never used smokeless tobacco. He reports that he does not drink alcohol or use drugs. He  has no sexual activity history on file. The patient  has a past surgical history that includes Eye surgery (Right, ~ 1980).  His family history includes Hypertension in his father and mother.  Review of Systems  Constitutional: Negative for chills, diaphoresis and fever.  Respiratory: Negative for shortness of breath.   Cardiovascular: Negative for chest pain, orthopnea and leg swelling.  Gastrointestinal: Negative for nausea.  Skin: Negative for rash.  Neurological: Negative for dizziness.    OBJECTIVE:  BP 125/89 (BP Location: Left Arm)   Pulse 93   Temp 98.1 F (36.7 C) (Oral)   SpO2 98%   Physical Exam  Constitutional: He is oriented to person, place, and time. He appears well-developed. He does not appear ill.  Eyes: Pupils are equal, round, and reactive to light. Conjunctivae and EOM are normal.  Neck: Normal range of motion. Muscular tenderness present. No spinous process tenderness present. No neck rigidity. No edema, no erythema and normal range of motion present.  Cardiovascular: Normal rate.  Pulmonary/Chest: Effort normal.  Abdominal: He exhibits no distension.  Musculoskeletal:  Normal range of motion.  Neurological: He is alert and oriented to person, place, and time. No cranial nerve deficit. Coordination normal.  Skin: Skin is warm and dry. He is not diaphoretic.  Psychiatric: He has a normal mood and affect.  Nursing note and vitals reviewed.   No results found for this or any previous visit (from the past 72 hour(s)).  No results found.  ASSESSMENT AND PLAN:  No orders of the defined types were placed in this encounter.    Neck pain New onset neck pain.  Patient has not tried any medication yet.  Will start a NSAID along with Flexeril and tramadol for breakthrough pain.   Trapezius muscle spasm      The patient is advised to call or return to clinic if he does not see an improvement in symptoms, or to seek the care of the closest emergency department if he worsens with the above plan.   Wesley [name], MHS, PA-C 12/30/2017 4:53 PM   Wesley [name], Michael L, PA-C 12/30/17 1654

## 2018-01-01 ENCOUNTER — Ambulatory Visit (HOSPITAL_COMMUNITY)
Admission: EM | Admit: 2018-01-01 | Discharge: 2018-01-01 | Disposition: A | Discharge status: Home / Self Care | Payer: Self-pay | Attending: Family Medicine | Admitting: Family Medicine

## 2018-01-01 ENCOUNTER — Encounter (HOSPITAL_COMMUNITY): Payer: Self-pay | Admitting: Emergency Medicine

## 2018-01-01 DIAGNOSIS — I10 Essential (primary) hypertension: Secondary | ICD-10-CM

## 2018-01-01 MED ORDER — AMLODIPINE BESYLATE 10 MG PO TABS: 10.0000 mg | ORAL_TABLET | Freq: Every day | ORAL | 0 refills | Status: DC

## 2018-01-01 NOTE — ED Triage Notes (Signed)
Pt here for dizziness starting today; pt sts hx of same and multiple visits for anxiety

## 2018-01-02 NOTE — ED Provider Notes (Signed)
[facility]   [id number] 01/01/18 Arrival Time: 1958  ASSESSMENT & PLAN:  1. Essential hypertension     Meds ordered this encounter  Medications  . amLODipine (NORVASC) 10 MG tablet    Sig: Take 1 tablet (10 mg total) by mouth daily.    Dispense:  30 tablet    Refill:  0   Wesley [name] is very worried over even the slightest increase in his BP. Reports several readings with systolic BP in the 160s. Will increase his Norvasc to 10mg  qd. If he feels like this makes his "dizziness" worse, he may need a different BP medication.  Excessive worry over his health continues to dominate his thoughts.  Encouraged him to call for PCP appt.  Reviewed expectations re: course of current medical issues. Questions answered. Outlined signs and symptoms indicating need for more acute intervention. Patient verbalized understanding. After Visit Summary given.   SUBJECTIVE:  Wesley [name] is a 52 y.o. male who presents with concerns regarding increased blood pressures. He reports that he has been taking his medications as directed.  He reports no chest pain on exertion, no dyspnea on exertion, no swelling of ankles and no palpitations.  Denies symptoms of chest pain, palpations, orthopnea, nocturnal dyspnea, or LE edema.  Does continue to express concern over chronic "dizziness". Has been seen many times for this with negative workups.  Social History   Tobacco Use  Smoking Status Never Smoker  Smokeless Tobacco Never Used    ROS: As per HPI.   OBJECTIVE:  Vitals:   01/01/18 2027  BP: (!) 144/92  Pulse: 97  Resp: 18  Temp: 98.5 F (36.9 C)  TempSrc: Oral  SpO2: 97%    General appearance: alert; no distress Eyes: PERRLA; EOMI HENT: normocephalic; atraumatic Neck: supple Lungs: clear to auscultation bilaterally Heart: regular rate and rhythm without murmer Extremities: no cyanosis or edema; symmetrical with no gross deformities Skin: warm and dry Psychological: alert  and cooperative  Labs: Results for orders placed or performed during the hospital encounter of 11/02/17  Lipase, blood  Result Value Ref Range   Lipase 37 11 - 51 U/L  Comprehensive metabolic panel  Result Value Ref Range   Sodium 136 135 - 145 mmol/L   Potassium 3.7 3.5 - 5.1 mmol/L   Chloride 101 101 - 111 mmol/L   CO2 22 22 - 32 mmol/L   Glucose, Bld 111 (H) 65 - 99 mg/dL   BUN 9 6 - 20 mg/dL   Creatinine, Ser 4.09 0.61 - 1.24 mg/dL   Calcium 8.8 (L) 8.9 - 10.3 mg/dL   Total Protein 7.7 6.5 - 8.1 g/dL   Albumin 4.1 3.5 - 5.0 g/dL   AST 30 15 - 41 U/L   ALT 27 17 - 63 U/L   Alkaline Phosphatase 76 38 - 126 U/L   Total Bilirubin 0.6 0.3 - 1.2 mg/dL   GFR calc non Af Amer >60 >60 mL/min   GFR calc Af Amer >60 >60 mL/min   Anion gap 13 5 - 15  CBC  Result Value Ref Range   WBC 10.5 4.0 - 10.5 K/uL   RBC 5.29 4.22 - 5.81 MIL/uL   Hemoglobin 14.3 13.0 - 17.0 g/dL   HCT 81.1 91.4 - 78.2 %   MCV 81.1 78.0 - 100.0 fL   MCH 27.0 26.0 - 34.0 pg   MCHC 33.3 30.0 - 36.0 g/dL   RDW 95.6 21.3 - 08.6 %   Platelets 248 150 - 400  K/uL   Labs Reviewed - No data to display   Allergies  Allergen Reactions  . Ibuprofen Other (See Comments) and Rash    Makes his throat get "very dry" after taking it States he cant take it  . Other Other (See Comments)    Steroids: Patient received a steroid shot from [facility] [phone] and it resulted in his muscles twitching all over his body  . Zofran [Ondansetron Hcl] Other (See Comments)    Causes sweating and body "feels badly"    Past Medical History:  Diagnosis Date  . Alcohol abuse   . Anxiety   . Anxiety   . Chronic neck pain   . Cocaine abuse (HCC)   . Diabetes mellitus without complication (HCC)   . Hypertension    Social History   Socioeconomic History  . Marital status: Significant Other    Spouse name: Not on file  . Number of children: Not on file  . Years of education: Not on file  . Highest education  level: Not on file  Occupational History  . Not on file  Social Needs  . Financial resource strain: Not on file  . Food insecurity:    Worry: Not on file    Inability: Not on file  . Transportation needs:    Medical: Not on file    Non-medical: Not on file  Tobacco Use  . Smoking status: Never Smoker  . Smokeless tobacco: Never Used  Substance and Sexual Activity  . Alcohol use: No    Comment: former  . Drug use: No    Comment: denies, reports past history   . Sexual activity: Not on file  Lifestyle  . Physical activity:    Days per week: Not on file    Minutes per session: Not on file  . Stress: Not on file  Relationships  . Social connections:    Talks on phone: Not on file    Gets together: Not on file    Attends religious service: Not on file    Active member of club or organization: Not on file    Attends meetings of clubs or organizations: Not on file    Relationship status: Not on file  . Intimate partner violence:    Fear of current or ex partner: Not on file    Emotionally abused: Not on file    Physically abused: Not on file    Forced sexual activity: Not on file  Other Topics Concern  . Not on file  Social History Narrative  . Not on file   Family History  Problem Relation Age of Onset  . Hypertension Mother   . Hypertension Father    Past Surgical History:  Procedure Laterality Date  . EYE SURGERY Right ~ 1980   "hit my eye"       Mardella LaymanHagler, Brian, MD 01/02/18 [phone]

## 2018-01-03 ENCOUNTER — Other Ambulatory Visit: Payer: Self-pay

## 2018-01-03 ENCOUNTER — Encounter (HOSPITAL_COMMUNITY): Payer: Self-pay

## 2018-01-03 ENCOUNTER — Emergency Department (HOSPITAL_COMMUNITY)
Admission: EM | Admit: 2018-01-03 | Discharge: 2018-01-04 | Disposition: A | Discharge status: Home / Self Care | Payer: Self-pay | Attending: Emergency Medicine | Admitting: Emergency Medicine

## 2018-01-03 DIAGNOSIS — R42 Dizziness and giddiness: Secondary | ICD-10-CM | POA: Insufficient documentation

## 2018-01-03 DIAGNOSIS — Z5321 Procedure and treatment not carried out due to patient leaving prior to being seen by health care provider: Secondary | ICD-10-CM | POA: Insufficient documentation

## 2018-01-03 NOTE — ED Notes (Signed)
Called Pt in lobby for vital recheck, no response x2. 

## 2018-01-03 NOTE — ED Triage Notes (Signed)
Pt reports having dizziness and reports that he had pain to his left hand middle finger 10/10 .

## 2018-01-03 NOTE — ED Notes (Signed)
Called Pt in lobby for vital recheck, no response in lobby x1. 

## 2018-01-06 ENCOUNTER — Encounter (HOSPITAL_COMMUNITY): Payer: Self-pay | Admitting: *Deleted

## 2018-01-06 ENCOUNTER — Ambulatory Visit (HOSPITAL_COMMUNITY)
Admission: EM | Admit: 2018-01-06 | Discharge: 2018-01-06 | Disposition: A | Discharge status: Home / Self Care | Payer: Self-pay | Attending: Internal Medicine | Admitting: Internal Medicine

## 2018-01-06 ENCOUNTER — Other Ambulatory Visit: Payer: Self-pay

## 2018-01-06 DIAGNOSIS — R42 Dizziness and giddiness: Secondary | ICD-10-CM

## 2018-01-06 DIAGNOSIS — M542 Cervicalgia: Secondary | ICD-10-CM

## 2018-01-06 DIAGNOSIS — F411 Generalized anxiety disorder: Secondary | ICD-10-CM

## 2018-01-06 MED ORDER — HYDROXYZINE HCL 25 MG PO TABS: 25.0000 mg | ORAL_TABLET | Freq: Four times a day (QID) | ORAL | 0 refills | Status: DC

## 2018-01-06 NOTE — ED Triage Notes (Signed)
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

## 2018-01-06 NOTE — Discharge Instructions (Signed)
Prescribed hydroxyzine for anxiety.  DO NOT TAKE with lorazepam or other medications for anxiety Follow up with psychiatrist this week Follow up with PCP if symptoms persists Return here or go to ER if you have any new or worsening symptoms

## 2018-01-06 NOTE — ED Provider Notes (Signed)
[facility]    CSN: [id number] Arrival date & time: 01/06/18  1725     History   Chief Complaint Chief Complaint  Patient presents with  . Dizziness  . Neck Pain    HPI Wesley [name] is a 52 y.o. male.   Patient complains of episodes of dizziness for the past couple of months.  He denies a precipitating event, or trauma, but attributes it to his anxiety.  Recently started amlodipine for HTN.  He describes the dizziness as intermittent with episodes lasting 1-2 hours.  He has tried taking lorazepam for anxiety which gives him relief from these symptoms.  His symptoms are made worse with movement.   He reports similar dizzy spells in the past and was prescribed meclizine.  However, he feels his symptoms are directly attributed to his anxiety.  He states he receives lorazepam from his psychiatrist and that he lost medication.  He has an appointment this week with his psychiatrist.       Past Medical History:  Diagnosis Date  . Alcohol abuse   . Anxiety   . Anxiety   . Chronic neck pain   . Cocaine abuse (HCC)   . Diabetes mellitus without complication (HCC)   . Hypertension     Patient Active Problem List   Diagnosis Date Noted  . Legally blind in right eye, as defined in [place] 05/02/2017  . Prediabetes 04/23/2017  . Muscle spasm 04/23/2017  . Essential hypertension 04/16/2017  . H/O medication noncompliance 04/02/2017  . Vitamin D deficiency 04/02/2017  . DJD (degenerative joint disease) of cervical spine 03/16/2017  . 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 Dizziness 01/23/2017  . Anxiety 11/21/2016    Past Surgical History:  Procedure Laterality Date  . EYE SURGERY Right ~ 1980   "hit my eye"       Home Medications    Prior to Admission medications   Medication Sig Start Date End Date Taking? Authorizing Provider  amLODipine (NORVASC) 10 MG tablet Take 1 tablet (10 mg total) by mouth daily. 01/01/18  Yes Hagler, Arlys John, MD  cyclobenzaprine (FLEXERIL) 10 MG  tablet Take 1 tablet (10 mg total) by mouth 3 (three) times daily. 12/30/17  Yes Ofilia Neas, PA-C  gabapentin (NEURONTIN) 300 MG capsule Take 1 capsule (300 mg total) by mouth at bedtime. 10/14/17  Yes Elvina Sidle, MD  LORazepam (ATIVAN) 0.5 MG tablet Take 1 tablet (0.5 mg total) by mouth 2 (two) times daily. for anxiety 08/05/17  Yes Elvina Sidle, MD  meloxicam (MOBIC) 15 MG tablet Take 0.5-1 tablets (7.5-15 mg total) by mouth daily. Take with food. Do not take Ibuprofen, Goody's, or Aleve while taking this medication. 12/30/17 01/29/18 Yes Ofilia Neas, PA-C  acetaminophen (TYLENOL) 500 MG tablet Take 1,000 mg by mouth daily as needed (back pain).    [provider]  cyclobenzaprine (FLEXERIL) 10 MG tablet Take 0.5-1 tablets (5-10 mg total) by mouth 3 (three) times daily as needed for muscle spasms. Do not mix with narcotics. May cause drowsiness. 12/30/17   Ofilia Neas, PA-C  hydrOXYzine (ATARAX/VISTARIL) 25 MG tablet Take 1 tablet (25 mg total) by mouth every 6 (six) hours. 01/06/18   Wurst, [place], PA-C  lidocaine (LIDODERM) 5 % Place 1 patch onto the skin daily. Remove & Discard patch within 12 hours or as directed by MD 10/22/17   Rise Mu, PA-C  methocarbamol (ROBAXIN) 500 MG tablet Take 1 tablet (500 mg total) by mouth 2 (two) times daily.  10/14/17   Joy, Shawn C, PA-C  pantoprazole (PROTONIX) 20 MG tablet Take 1 tablet (20 mg total) by mouth daily. 10/11/17 11/10/17  Georgetta Haber, NP  predniSONE (STERAPRED UNI-PAK 21 TAB) 10 MG (21) TBPK tablet Take by mouth daily. Take as directed. 12/18/17   Mardella Layman, MD  promethazine (PHENERGAN) 25 MG tablet Take 1 tablet (25 mg total) by mouth every 6 (six) hours as needed for nausea or vomiting. 11/02/17   Donnetta Hutching, MD  traMADol (ULTRAM) 50 MG tablet Take 1 tablet (50 mg total) by mouth every 6 (six) hours as needed for severe pain. 12/30/17   Ofilia Neas, PA-C    Family History Family History  Problem  Relation Age of Onset  . Hypertension Mother   . Hypertension Father     Social History Social History   Tobacco Use  . Smoking status: Never Smoker  . Smokeless tobacco: Never Used  Substance Use Topics  . Alcohol use: No    Comment: former  . Drug use: No    Comment: denies, reports past history      Allergies   Ibuprofen; Other; and Zofran [ondansetron hcl]   Review of Systems Review of Systems  Constitutional: Positive for chills. Negative for fever.  Respiratory: Negative for cough and shortness of breath.   Cardiovascular: Negative for chest pain.  Gastrointestinal: Negative for nausea and vomiting.  Neurological: Positive for dizziness. Negative for syncope, weakness and numbness.  Psychiatric/Behavioral: The patient is nervous/anxious.      Physical Exam Triage Vital Signs ED Triage Vitals  Enc Vitals Group     BP 01/06/18 1738 131/85     Pulse Rate 01/06/18 1738 (!) 105     Resp 01/06/18 1738 16     Temp 01/06/18 1738 98.1 F (36.7 C)     Temp Source 01/06/18 1738 Oral     SpO2 01/06/18 1738 99 %     Weight --      Height --      Head Circumference --      Peak Flow --      Pain Score 01/06/18 1739 8     Pain Loc --      Pain Edu? --      Excl. in GC? --    No data found.  Updated Vital Signs BP 131/85   Pulse (!) 105   Temp 98.1 F (36.7 C) (Oral)   Resp 16   SpO2 99%    Physical Exam  Constitutional: He is oriented to person, place, and time. He appears well-developed and well-nourished. No distress.  HENT:  Head: Normocephalic and atraumatic.  Right Ear: External ear normal.  Left Ear: External ear normal.  Nose: Nose normal.  Mouth/Throat: Oropharynx is clear and moist. No oropharyngeal exudate.  Eyes: Pupils are equal, round, and reactive to light. EOM are normal. No scleral icterus.  Neck: Normal range of motion. Neck supple.  Cardiovascular: Normal rate, regular rhythm and normal heart sounds.  Radial pulse 2+ bilaterally      Pulmonary/Chest: Effort normal and breath sounds normal. No stridor. No respiratory distress. He has no wheezes. He has no rales.  Abdominal: Soft. Bowel sounds are normal. There is no tenderness. There is no guarding.  Musculoskeletal:  Ambulates from chair to exam table without difficulty  Neurological: He is alert and oriented to person, place, and time.  Skin: Skin is warm and dry. Capillary refill takes 2 to 3 seconds. He is not diaphoretic.  Psychiatric:  Patient appear anxious. Speech is pressured.  Thoughts are preoccupied with getting prescription for lorazepam.  Insistent about receiving at least "five pills" of lorazepam until he can follow up with psychiatrist.       UC Treatments / Results  Labs (all labs ordered are listed, but only abnormal results are displayed) Labs Reviewed - No data to display  EKG None Radiology No results found.  Procedures Procedures (including critical care time)  Medications Ordered in UC Medications - No data to display   Initial Impression / Assessment and Plan / UC Course  I have reviewed the triage vital signs and the nursing notes.  Pertinent labs & imaging results that were available during my care of the patient were reviewed by me and considered in my medical decision making (see chart for details).     Patient presents with chief complains of "dizziness" that he attributes to anxiety.  Hx and PE consistent with generalized anxiety disorder.  Patient is insistent about receiving a prescription for lorazepam after losing medication.  Patient has a follow up appointment with psychiatrist this week.  Prescribed hydroxyzine #12.  Will take as prescribed and follow up with psychiatrist this week.  Return and ER precautions given.    Final Clinical Impressions(s) / UC Diagnoses   Final diagnoses:  Generalized anxiety disorder    ED Discharge Orders        Ordered    hydrOXYzine (ATARAX/VISTARIL) 25 MG tablet  Every 6 hours      01/06/18 1835       Controlled Substance Prescriptions [place] Controlled Substance Registry consulted? Yes, I have consulted the [place] Controlled Substances Registry for this patient, and feel the risk/benefit ratio today is favorable for proceeding with this prescription for a controlled substance.   Rennis Harding, PA-C 01/06/18 1849

## 2018-01-11 ENCOUNTER — Ambulatory Visit (HOSPITAL_COMMUNITY)
Admission: EM | Admit: 2018-01-11 | Discharge: 2018-01-11 | Disposition: A | Discharge status: Home / Self Care | Payer: Self-pay | Attending: Emergency Medicine | Admitting: Emergency Medicine

## 2018-01-11 ENCOUNTER — Encounter (HOSPITAL_COMMUNITY): Payer: Self-pay | Admitting: Emergency Medicine

## 2018-01-11 DIAGNOSIS — M542 Cervicalgia: Secondary | ICD-10-CM

## 2018-01-11 DIAGNOSIS — M62838 Other muscle spasm: Secondary | ICD-10-CM

## 2018-01-11 MED ORDER — CYCLOBENZAPRINE HCL 10 MG PO TABS: 10.0000 mg | ORAL_TABLET | Freq: Every evening | ORAL | 0 refills | Status: DC | PRN

## 2018-01-11 MED ORDER — DICLOFENAC SODIUM 1 % TD GEL: 2.0000 g | Freq: Four times a day (QID) | TRANSDERMAL | 0 refills | Status: DC

## 2018-01-11 NOTE — ED Triage Notes (Signed)
Pt here for chronic dizziness and neck pain

## 2018-01-11 NOTE — ED Notes (Signed)
Pt discharged by provider.

## 2018-01-11 NOTE — ED Provider Notes (Signed)
[facility]    CSN: [id number] Arrival date & time: 01/11/18  4098     History   Chief Complaint Chief Complaint  Patient presents with  . Dizziness    HPI Wesley [name] is a 52 y.o. male history of anxiety, chronic neck pain, diabetes mellitus, hypertension presenting today for evaluation of neck pain.  Patient states that for the past 4 days he has had worsening neck pain, worse on his left side.  Patient denies any injury.  He notes that it is very difficult for him to sleep, occasionally will have numbness radiating to his third through fifth fingers of his left hand.  Neck/upper back pain has caused him significant discomfort.  He has not taken anything for this.  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  Patient does note he has a PCP as well as has plans to follow-up with psychiatry next week in [place] for his anxiety.  HPI  Past Medical History:  Diagnosis Date  . Alcohol abuse   . Anxiety   . Anxiety   . Chronic neck pain   . Cocaine abuse (HCC)   . Diabetes mellitus without complication (HCC)   . Hypertension     Patient Active Problem List   Diagnosis Date Noted  . Legally blind in right eye, as defined in [place] 05/02/2017  . Prediabetes 04/23/2017  . Muscle spasm 04/23/2017  . Essential hypertension 04/16/2017  . H/O medication noncompliance 04/02/2017  . Vitamin D deficiency 04/02/2017  . DJD (degenerative joint disease) of cervical spine 03/16/2017  . 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 Dizziness 01/23/2017  . Anxiety 11/21/2016    Past Surgical History:  Procedure Laterality Date  . EYE SURGERY Right ~ 1980   "hit my eye"       Home Medications    Prior to Admission medications   Medication Sig Start Date End Date Taking? Authorizing Provider  acetaminophen (TYLENOL) 500 MG tablet Take 1,000 mg by mouth daily as needed (back pain).    [provider]  amLODipine (NORVASC) 10 MG tablet Take 1 tablet (10  mg total) by mouth daily. 01/01/18   Mardella Layman, MD  cyclobenzaprine (FLEXERIL) 10 MG tablet Take 1 tablet (10 mg total) by mouth at bedtime as needed for muscle spasms. 01/11/18   Wieters, Hallie C, PA-C  diclofenac sodium (VOLTAREN) 1 % GEL Apply 2 g topically 4 (four) times daily. 01/11/18   Wieters, Hallie C, PA-C  gabapentin (NEURONTIN) 300 MG capsule Take 1 capsule (300 mg total) by mouth at bedtime. 10/14/17   Elvina Sidle, MD  hydrOXYzine (ATARAX/VISTARIL) 25 MG tablet Take 1 tablet (25 mg total) by mouth every 6 (six) hours. 01/06/18   Wurst, [place], PA-C  lidocaine (LIDODERM) 5 % Place 1 patch onto the skin daily. Remove & Discard patch within 12 hours or as directed by MD 10/22/17   Demetrios Loll T, PA-C  LORazepam (ATIVAN) 0.5 MG tablet Take 1 tablet (0.5 mg total) by mouth 2 (two) times daily. for anxiety 08/05/17   Elvina Sidle, MD  meloxicam (MOBIC) 15 MG tablet Take 0.5-1 tablets (7.5-15 mg total) by mouth daily. Take with food. Do not take Ibuprofen, Goody's, or Aleve while taking this medication. 12/30/17 01/29/18  Ofilia Neas, PA-C  methocarbamol (ROBAXIN) 500 MG tablet Take 1 tablet (500 mg total) by mouth 2 (two) times daily. 10/14/17   Joy, Shawn C, PA-C  pantoprazole (PROTONIX) 20 MG tablet Take 1 tablet (20  mg total) by mouth daily. 10/11/17 11/10/17  Georgetta Haber, NP  predniSONE (STERAPRED UNI-PAK 21 TAB) 10 MG (21) TBPK tablet Take by mouth daily. Take as directed. 12/18/17   Mardella Layman, MD  promethazine (PHENERGAN) 25 MG tablet Take 1 tablet (25 mg total) by mouth every 6 (six) hours as needed for nausea or vomiting. 11/02/17   Donnetta Hutching, MD  traMADol (ULTRAM) 50 MG tablet Take 1 tablet (50 mg total) by mouth every 6 (six) hours as needed for severe pain. 12/30/17   Ofilia Neas, PA-C    Family History Family History  Problem Relation Age of Onset  . Hypertension Mother   . Hypertension Father     Social History Social History   Tobacco Use  .  Smoking status: Never Smoker  . Smokeless tobacco: Never Used  Substance Use Topics  . Alcohol use: No    Comment: former  . Drug use: No    Comment: denies, reports past history      Allergies   Ibuprofen; Other; and Zofran [ondansetron hcl]   Review of Systems Review of Systems  Eyes: Negative for visual disturbance.  Respiratory: Negative for shortness of breath.   Cardiovascular: Negative for chest pain.  Gastrointestinal: Negative for abdominal pain, nausea and vomiting.  Musculoskeletal: Positive for myalgias and neck pain. Negative for arthralgias, back pain, gait problem and neck stiffness.  Skin: Negative for color change and wound.  Neurological: Negative for syncope, speech difficulty, weakness, light-headedness, numbness and headaches.     Physical Exam Triage Vital Signs ED Triage Vitals [01/11/18 1922]  Enc Vitals Group     BP (!) 135/91     Pulse Rate 98     Resp 18     Temp 98.1 F (36.7 C)     Temp src      SpO2 100 %     Weight      Height      Head Circumference      Peak Flow      Pain Score      Pain Loc      Pain Edu?      Excl. in GC?    No data found.  Updated Vital Signs BP (!) 135/91   Pulse 98   Temp 98.1 F (36.7 C)   Resp 18   SpO2 100%   Visual Acuity Right Eye Distance:   Left Eye Distance:   Bilateral Distance:    Right Eye Near:   Left Eye Near:    Bilateral Near:     Physical Exam  Constitutional: He appears well-developed and well-nourished.  HENT:  Head: Normocephalic and atraumatic.  Eyes: Conjunctivae are normal.  Neck: Neck supple.  Cardiovascular: Normal rate.  Pulmonary/Chest: Effort normal. No respiratory distress.  Musculoskeletal: He exhibits tenderness. He exhibits no edema.  No obvious deformity or swelling, tenderness to palpation of trapezius muscle over left side, no focal midline tenderness of cervical spine, full active range of motion of neck, strength 5/5 and equal bilaterally at shoulders    Neurological: He is alert.  Skin: Skin is warm and dry.  Psychiatric: He has a normal mood and affect.  Nursing note and vitals reviewed.    UC Treatments / Results  Labs (all labs ordered are listed, but only abnormal results are displayed) Labs Reviewed - No data to display  EKG None  Radiology No results found.  Procedures Procedures (including critical care time)  Medications Ordered in UC Medications -  No data to display  Initial Impression / Assessment and Plan / UC Course  I have reviewed the triage vital signs and the nursing notes.  Pertinent labs & imaging results that were available during my care of the patient were reviewed by me and considered in my medical decision making (see chart for details).     Patient with chronic neck pain who has been seen here multiple times for the same issue.  Will try topical Voltaren as alternative to oral medications, Flexeril at bedtime to help with sleep.  Possible has some ulnar nerve entrapment involved as well.  Chronic neck pain likely related to painting. Discussed strict return precautions. Patient verbalized understanding and is agreeable with plan.  Final Clinical Impressions(s) / UC Diagnoses   Final diagnoses:  Neck pain  Trapezius muscle spasm     Discharge Instructions     Use voltaren cream on shoulder  Flexeril at night to help with pain and sleep   ED Prescriptions    Medication Sig Dispense Auth. Provider   cyclobenzaprine (FLEXERIL) 10 MG tablet Take 1 tablet (10 mg total) by mouth at bedtime as needed for muscle spasms. 10 tablet Wieters, Hallie C, PA-C   diclofenac sodium (VOLTAREN) 1 % GEL Apply 2 g topically 4 (four) times daily. 100 g Wieters, [place] C, PA-C     Controlled Substance Prescriptions [place] Controlled Substance Registry consulted? Not Applicable   Lew Dawes, [place] 01/11/18 2115

## 2018-01-11 NOTE — Discharge Instructions (Signed)
Use voltaren cream on shoulder  Flexeril at night to help with pain and sleep

## 2018-01-14 ENCOUNTER — Telehealth: Payer: Self-pay

## 2018-01-14 ENCOUNTER — Encounter (HOSPITAL_COMMUNITY): Payer: Self-pay | Admitting: Family Medicine

## 2018-01-14 ENCOUNTER — Ambulatory Visit (HOSPITAL_COMMUNITY)
Admission: EM | Admit: 2018-01-14 | Discharge: 2018-01-14 | Disposition: A | Discharge status: Home / Self Care | Payer: Self-pay | Attending: Family Medicine | Admitting: Family Medicine

## 2018-01-14 DIAGNOSIS — I10 Essential (primary) hypertension: Secondary | ICD-10-CM

## 2018-01-14 NOTE — Telephone Encounter (Signed)
Patient came in person   CMA checked patient Blood Pressure   Blood Pressure : 143 91  HR; 108  Oxygen 97%  Advised patient to establish care with a pcp

## 2018-01-14 NOTE — ED Triage Notes (Signed)
Pt here for dizziness for  The past 6 hours. He took his HTN meds and anxiety meds today.

## 2018-01-14 NOTE — ED Provider Notes (Signed)
[facility]    CSN: [id number] Arrival date & time: 01/14/18  1626     History   Chief Complaint Chief Complaint  Patient presents with  . Dizziness    HPI Wesley [name] is a 52 y.o. male.   Patient frequent visitor to facility complains of dizziness and flushed his amlodipine was recently increased at last visit from 5 to 10 mg. Patient has other worries and concerns sometimes manifested by tightness in his neck muscles.  HPI  Past Medical History:  Diagnosis Date  . Alcohol abuse   . Anxiety   . Anxiety   . Chronic neck pain   . Cocaine abuse (HCC)   . Diabetes mellitus without complication (HCC)   . Hypertension     Patient Active Problem List   Diagnosis Date Noted  . Legally blind in right eye, as defined in [place] 05/02/2017  . Prediabetes 04/23/2017  . Muscle spasm 04/23/2017  . Essential hypertension 04/16/2017  . H/O medication noncompliance 04/02/2017  . Vitamin D deficiency 04/02/2017  . DJD (degenerative joint disease) of cervical spine 03/16/2017  . 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 Dizziness 01/23/2017  . Anxiety 11/21/2016    Past Surgical History:  Procedure Laterality Date  . EYE SURGERY Right ~ 1980   "hit my eye"       Home Medications    Prior to Admission medications   Medication Sig Start Date End Date Taking? Authorizing Provider  acetaminophen (TYLENOL) 500 MG tablet Take 1,000 mg by mouth daily as needed (back pain).    [provider]  amLODipine (NORVASC) 10 MG tablet Take 1 tablet (10 mg total) by mouth daily. 01/01/18   Mardella Layman, MD  cyclobenzaprine (FLEXERIL) 10 MG tablet Take 1 tablet (10 mg total) by mouth at bedtime as needed for muscle spasms. 01/11/18   Wieters, Hallie C, PA-C  diclofenac sodium (VOLTAREN) 1 % GEL Apply 2 g topically 4 (four) times daily. 01/11/18   Wieters, Hallie C, PA-C  gabapentin (NEURONTIN) 300 MG capsule Take 1 capsule (300 mg total) by mouth at bedtime. 10/14/17   Elvina Sidle, MD    hydrOXYzine (ATARAX/VISTARIL) 25 MG tablet Take 1 tablet (25 mg total) by mouth every 6 (six) hours. 01/06/18   Wurst, [place], PA-C  lidocaine (LIDODERM) 5 % Place 1 patch onto the skin daily. Remove & Discard patch within 12 hours or as directed by MD 10/22/17   Demetrios Loll T, PA-C  LORazepam (ATIVAN) 0.5 MG tablet Take 1 tablet (0.5 mg total) by mouth 2 (two) times daily. for anxiety 08/05/17   Elvina Sidle, MD  meloxicam (MOBIC) 15 MG tablet Take 0.5-1 tablets (7.5-15 mg total) by mouth daily. Take with food. Do not take Ibuprofen, Goody's, or Aleve while taking this medication. 12/30/17 01/29/18  Ofilia Neas, PA-C  methocarbamol (ROBAXIN) 500 MG tablet Take 1 tablet (500 mg total) by mouth 2 (two) times daily. 10/14/17   Joy, Shawn C, PA-C  pantoprazole (PROTONIX) 20 MG tablet Take 1 tablet (20 mg total) by mouth daily. 10/11/17 11/10/17  Georgetta Haber, NP  predniSONE (STERAPRED UNI-PAK 21 TAB) 10 MG (21) TBPK tablet Take by mouth daily. Take as directed. 12/18/17   Mardella Layman, MD  promethazine (PHENERGAN) 25 MG tablet Take 1 tablet (25 mg total) by mouth every 6 (six) hours as needed for nausea or vomiting. 11/02/17   Donnetta Hutching, MD  traMADol (ULTRAM) 50 MG tablet Take 1 tablet (50 mg total) by mouth every 6 (  six) hours as needed for severe pain. 12/30/17   Ofilia Neas, PA-C    Family History Family History  Problem Relation Age of Onset  . Hypertension Mother   . Hypertension Father     Social History Social History   Tobacco Use  . Smoking status: Never Smoker  . Smokeless tobacco: Never Used  Substance Use Topics  . Alcohol use: No    Comment: former  . Drug use: No    Comment: denies, reports past history      Allergies   Ibuprofen; Other; and Zofran [ondansetron hcl]   Review of Systems Review of Systems  Constitutional: Negative for chills and fever.  HENT: Negative for ear pain and sore throat.   Eyes: Negative for pain and visual disturbance.   Respiratory: Negative for cough and shortness of breath.   Cardiovascular: Negative for chest pain and palpitations.  Gastrointestinal: Negative for abdominal pain and vomiting.  Genitourinary: Negative for dysuria and hematuria.  Musculoskeletal: Positive for neck pain. Negative for arthralgias and back pain.  Skin: Negative for color change and rash.  Neurological: Positive for light-headedness. Negative for dizziness, seizures and syncope.  Psychiatric/Behavioral: The patient is nervous/anxious.   All other systems reviewed and are negative.    Physical Exam Triage Vital Signs ED Triage Vitals  Enc Vitals Group     BP 01/14/18 1657 128/81     Pulse Rate 01/14/18 1657 96     Resp 01/14/18 1657 18     Temp 01/14/18 1657 98 F (36.7 C)     Temp src --      SpO2 01/14/18 1657 99 %     Weight --      Height --      Head Circumference --      Peak Flow --      Pain Score 01/14/18 1656 6     Pain Loc --      Pain Edu? --      Excl. in GC? --    No data found.  Updated Vital Signs BP 128/81   Pulse 96   Temp 98 F (36.7 C)   Resp 18   SpO2 99%   Visual Acuity Right Eye Distance:   Left Eye Distance:   Bilateral Distance:    Right Eye Near:   Left Eye Near:    Bilateral Near:     Physical Exam  Constitutional: He is oriented to person, place, and time. He appears well-developed and well-nourished.  HENT:  Head: Normocephalic.  Cardiovascular: Normal rate and regular rhythm.  Pulmonary/Chest: Effort normal and breath sounds normal.  Neurological: He is alert and oriented to person, place, and time.  Skin:  Patient has areas between toes of his feet with some skin breakdown consistent with tinea     UC Treatments / Results  Labs (all labs ordered are listed, but only abnormal results are displayed) Labs Reviewed - No data to display  EKG None  Radiology No results found.  Procedures Procedures (including critical care time)  Medications Ordered  in UC Medications - No data to display  Initial Impression / Assessment and Plan / UC Course  I have reviewed the triage vital signs and the nursing notes.  Pertinent labs & imaging results that were available during my care of the patient were reviewed by me and considered in my medical decision making (see chart for details).     Dizziness related to adjustment to increase amlodipine.  Patient urged  to continue medication and reassured that symptoms should resolve Final Clinical Impressions(s) / UC Diagnoses   Final diagnoses:  None   Discharge Instructions   None    ED Prescriptions    None     Controlled Substance Prescriptions [place] Controlled Substance Registry consulted? No   Frederica Kuster, MD 01/14/18 [phone]

## 2018-01-17 ENCOUNTER — Ambulatory Visit: Payer: Self-pay

## 2018-01-19 ENCOUNTER — Other Ambulatory Visit: Payer: Self-pay

## 2018-01-19 ENCOUNTER — Emergency Department (HOSPITAL_COMMUNITY)
Admission: EM | Admit: 2018-01-19 | Discharge: 2018-01-19 | Disposition: A | Discharge status: Home / Self Care | Payer: Self-pay | Attending: Emergency Medicine | Admitting: Emergency Medicine

## 2018-01-19 ENCOUNTER — Encounter (HOSPITAL_COMMUNITY): Payer: Self-pay | Admitting: Emergency Medicine

## 2018-01-19 DIAGNOSIS — M25512 Pain in left shoulder: Secondary | ICD-10-CM | POA: Insufficient documentation

## 2018-01-19 DIAGNOSIS — M542 Cervicalgia: Secondary | ICD-10-CM | POA: Insufficient documentation

## 2018-01-19 DIAGNOSIS — Z5321 Procedure and treatment not carried out due to patient leaving prior to being seen by health care provider: Secondary | ICD-10-CM | POA: Insufficient documentation

## 2018-01-19 DIAGNOSIS — R110 Nausea: Secondary | ICD-10-CM | POA: Insufficient documentation

## 2018-01-19 NOTE — ED Triage Notes (Signed)
Patient is complaining of neck pain that radiates down his left am. Patient states he feels nauseated.

## 2018-01-27 ENCOUNTER — Other Ambulatory Visit: Payer: Self-pay

## 2018-01-27 ENCOUNTER — Emergency Department (HOSPITAL_COMMUNITY)
Admission: EM | Admit: 2018-01-27 | Discharge: 2018-01-27 | Disposition: A | Discharge status: Home / Self Care | Payer: Self-pay | Attending: Emergency Medicine | Admitting: Emergency Medicine

## 2018-01-27 ENCOUNTER — Encounter (HOSPITAL_COMMUNITY): Payer: Self-pay | Admitting: Emergency Medicine

## 2018-01-27 DIAGNOSIS — I10 Essential (primary) hypertension: Secondary | ICD-10-CM | POA: Insufficient documentation

## 2018-01-27 DIAGNOSIS — E119 Type 2 diabetes mellitus without complications: Secondary | ICD-10-CM | POA: Insufficient documentation

## 2018-01-27 DIAGNOSIS — R110 Nausea: Secondary | ICD-10-CM | POA: Insufficient documentation

## 2018-01-27 DIAGNOSIS — F419 Anxiety disorder, unspecified: Secondary | ICD-10-CM | POA: Insufficient documentation

## 2018-01-27 DIAGNOSIS — Z79899 Other long term (current) drug therapy: Secondary | ICD-10-CM | POA: Insufficient documentation

## 2018-01-27 LAB — BASIC METABOLIC PANEL
Anion gap: 10 (ref 5–15)
BUN: 7 mg/dL (ref 6–20)
CO2: 25 mmol/L (ref 22–32)
Calcium: 9.1 mg/dL (ref 8.9–10.3)
Chloride: 103 mmol/L (ref 101–111)
Creatinine, Ser: 0.73 mg/dL (ref 0.61–1.24)
GFR calc Af Amer: >60 mL/min (ref >60)
GFR calc non Af Amer: >60 mL/min (ref >60)
Glucose, Bld: 152 mg/dL — ABNORMAL HIGH (ref 65–99)
Potassium: 3.6 mmol/L (ref 3.5–5.1)
Sodium: 138 mmol/L (ref 135–145)

## 2018-01-27 LAB — CBC
HCT: 41.7 % (ref 39.0–52.0)
Hemoglobin: 13.6 g/dL (ref 13.0–17.0)
MCH: 26.5 pg (ref 26.0–34.0)
MCHC: 32.6 g/dL (ref 30.0–36.0)
MCV: 81.3 fL (ref 78.0–100.0)
Platelets: 330 10*3/uL (ref 150–400)
RBC: 5.13 MIL/uL (ref 4.22–5.81)
RDW: 13.7 % (ref 11.5–15.5)
WBC: 7.2 10*3/uL (ref 4.0–10.5)

## 2018-01-27 NOTE — ED Notes (Signed)
Patient states that he forgot to take his BP meds yesterday; denies CP ore SOB. Normally takes his BP meds at 13:00 daily, but went ahead and took meds this am @ 0500

## 2018-01-27 NOTE — ED Provider Notes (Signed)
[facility] EMERGENCY DEPARTMENT Provider Note   CSN: [id number] Arrival date & time: 01/27/18  0453     History   Chief Complaint Chief Complaint  Patient presents with  . Hypertension  . Dizziness    HPI Wesley [name] is a 52 y.o. male.  The history is provided by the patient.  Hypertension  This is a chronic problem. The problem occurs constantly. The problem has been gradually improving. Pertinent negatives include no chest pain and no shortness of breath. Nothing aggravates the symptoms.  Dizziness  Associated symptoms: nausea   Associated symptoms: no chest pain, no shortness of breath and no weakness   Patient with history of anxiety, hypertension presents with elevated blood pressure and mild dizziness.  He reports that he forgot to take his medicines for blood pressure, and he woke up tonight feeling dizzy and feeling anxious.  He also reports mild nausea.  He denies any chest pain. No other acute complaints Past Medical History:  Diagnosis Date  . Alcohol abuse   . Anxiety   . Anxiety   . Chronic neck pain   . Cocaine abuse (HCC)   . Diabetes mellitus without complication (HCC)   . Hypertension     Patient Active Problem List   Diagnosis Date Noted  . Legally blind in right eye, as defined in [place] 05/02/2017  . Prediabetes 04/23/2017  . Muscle spasm 04/23/2017  . Essential hypertension 04/16/2017  . H/O medication noncompliance 04/02/2017  . Vitamin D deficiency 04/02/2017  . DJD (degenerative joint disease) of cervical spine 03/16/2017  . 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 Dizziness 01/23/2017  . Anxiety 11/21/2016    Past Surgical History:  Procedure Laterality Date  . EYE SURGERY Right ~ 1980   "hit my eye"        Home Medications    Prior to Admission medications   Medication Sig Start Date End Date Taking? Authorizing Provider  acetaminophen (TYLENOL) 500 MG tablet Take 1,000 mg by mouth daily as needed (back pain).    [provider]  amLODipine (NORVASC) 10 MG tablet Take 1 tablet (10 mg total) by mouth daily. 01/01/18   Mardella Layman, MD  cyclobenzaprine (FLEXERIL) 10 MG tablet Take 1 tablet (10 mg total) by mouth at bedtime as needed for muscle spasms. 01/11/18   Wieters, Hallie C, PA-C  diclofenac sodium (VOLTAREN) 1 % GEL Apply 2 g topically 4 (four) times daily. 01/11/18   Wieters, Hallie C, PA-C  gabapentin (NEURONTIN) 300 MG capsule Take 1 capsule (300 mg total) by mouth at bedtime. 10/14/17   Elvina Sidle, MD  hydrOXYzine (ATARAX/VISTARIL) 25 MG tablet Take 1 tablet (25 mg total) by mouth every 6 (six) hours. 01/06/18   Wurst, [place], PA-C  lidocaine (LIDODERM) 5 % Place 1 patch onto the skin daily. Remove & Discard patch within 12 hours or as directed by MD 10/22/17   Demetrios Loll T, PA-C  LORazepam (ATIVAN) 0.5 MG tablet Take 1 tablet (0.5 mg total) by mouth 2 (two) times daily. for anxiety 08/05/17   Elvina Sidle, MD  meloxicam (MOBIC) 15 MG tablet Take 0.5-1 tablets (7.5-15 mg total) by mouth daily. Take with food. Do not take Ibuprofen, Goody's, or Aleve while taking this medication. 12/30/17 01/29/18  Ofilia Neas, PA-C  methocarbamol (ROBAXIN) 500 MG tablet Take 1 tablet (500 mg total) by mouth 2 (two) times daily. 10/14/17   Joy, Shawn C, PA-C  pantoprazole (PROTONIX) 20 MG tablet Take 1 tablet (20 mg  total) by mouth daily. 10/11/17 11/10/17  Georgetta Haber, NP  traMADol (ULTRAM) 50 MG tablet Take 1 tablet (50 mg total) by mouth every 6 (six) hours as needed for severe pain. 12/30/17   Ofilia Neas, PA-C    Family History Family History  Problem Relation Age of Onset  . Hypertension Mother   . Hypertension Father     Social History Social History   Tobacco Use  . Smoking status: Never Smoker  . Smokeless tobacco: Never Used  Substance Use Topics  . Alcohol use: No    Comment: former  . Drug use: No    Comment: denies, reports past history      Allergies   Ibuprofen;  Other; and Zofran [ondansetron hcl]   Review of Systems Review of Systems  Constitutional: Negative for fever.  Respiratory: Negative for shortness of breath.   Cardiovascular: Negative for chest pain.  Gastrointestinal: Positive for nausea.  Neurological: Positive for dizziness. Negative for weakness.  All other systems reviewed and are negative.    Physical Exam Updated Vital Signs BP (!) 149/99   Pulse (!) 105   Temp 97.8 F (36.6 C) (Oral)   Resp 16   SpO2 98%   Physical Exam CONSTITUTIONAL: Well developed/well nourished HEAD: Normocephalic/atraumatic EYES: EOMI ENMT: Mucous membranes moist NECK: supple no meningeal signs CV: S1/S2 noted, no murmurs/rubs/gallops noted LUNGS: Lungs are clear to auscultation bilaterally, no apparent distress ABDOMEN: soft, nontender GU:no cva tenderness NEURO: Pt is awake/alert/appropriate, moves all extremitiesx4.  No facial droop.  Normal leg drift.  No ataxia EXTREMITIES: pulses normal/equal, full ROM SKIN: warm, color normal PSYCH: no abnormalities of mood noted, alert and oriented to situation   ED Treatments / Results  Labs (all labs ordered are listed, but only abnormal results are displayed) Labs Reviewed  BASIC METABOLIC PANEL - Abnormal; Notable for the following components:      Result Value   Glucose, Bld 152 (*)    All other components within normal limits  CBC    EKG EKG Interpretation  Date/Time:  Sunday Jan 27 2018 05:12:29 EDT Ventricular Rate:  105 PR Interval:  134 QRS Duration: 100 QT Interval:  334 QTC Calculation: 441 R Axis:   -38 Text Interpretation:  Sinus tachycardia Left axis deviation Left ventricular hypertrophy Possible Lateral infarct , age undetermined Abnormal ECG Confirmed by Zadie Rhine ([id number]) on 01/27/2018 6:41:56 AM   Radiology No results found.  Procedures Procedures (including critical care time)  Medications Ordered in ED Medications - No data to display   Initial  Impression / Assessment and Plan / ED Course  I have reviewed the triage vital signs and the nursing notes.  Pertinent labsresults that were available during my care of the patient were reviewed by me and considered in my medical decision making (see chart for details).     He was already feeling better by the time of my evaluation. Advised need for med compliance. Labs reassuring. DC home  Final Clinical Impressions(s) / ED Diagnoses   Final diagnoses:  Essential hypertension    ED Discharge Orders    None       Zadie Rhine, MD 01/27/18 [phone]

## 2018-01-27 NOTE — ED Triage Notes (Signed)
Pt reports he woke up and was dizzy.  States his blood pressure is up as well.

## 2018-02-02 ENCOUNTER — Encounter (HOSPITAL_COMMUNITY): Payer: Self-pay | Admitting: Emergency Medicine

## 2018-02-02 ENCOUNTER — Ambulatory Visit (HOSPITAL_COMMUNITY)
Admission: EM | Admit: 2018-02-02 | Discharge: 2018-02-02 | Disposition: A | Discharge status: Home / Self Care | Payer: Self-pay | Attending: Family Medicine | Admitting: Family Medicine

## 2018-02-02 DIAGNOSIS — L740 Miliaria rubra: Secondary | ICD-10-CM

## 2018-02-02 DIAGNOSIS — S46912A Strain of unspecified muscle, fascia and tendon at shoulder and upper arm level, left arm, initial encounter: Secondary | ICD-10-CM

## 2018-02-02 MED ORDER — PREDNISONE 20 MG PO TABS: ORAL_TABLET | ORAL | 0 refills | Status: DC

## 2018-02-02 NOTE — ED Provider Notes (Signed)
[facility] CARE [facility]ENTER   [id number] 02/02/18 Arrival Time: 1213   SUBJECTIVE:  Wesley [name] is a 52 y.o. male who presents to the urgent care with complaint of left shoulder pain upon waking this am  Patient also has some burning on his forearms when he is exposed to the sun and was worried that he possibly has a reaction to the medications he is taking for anxiety.  Patient has no chest pain or shortness of breath and is able to move his arm normally. He has no neck pain is able to move his neck normally  Past Medical History:  Diagnosis Date  . Alcohol abuse   . Anxiety   . Anxiety   . Chronic neck pain   . Cocaine abuse (HCC)   . Diabetes mellitus without complication (HCC)   . Hypertension    Family History  Problem Relation Age of Onset  . Hypertension Mother   . Hypertension Father    Social History   Socioeconomic History  . Marital status: Significant Other    Spouse name: Not on file  . Number of children: Not on file  . Years of education: Not on file  . Highest education level: Not on file  Occupational History  . Not on file  Social Needs  . Financial resource strain: Not on file  . Food insecurity:    Worry: Not on file    Inability: Not on file  . Transportation needs:    Medical: Not on file    Non-medical: Not on file  Tobacco Use  . Smoking status: Never Smoker  . Smokeless tobacco: Never Used  Substance and Sexual Activity  . Alcohol use: No    Comment: former  . Drug use: No    Comment: denies, reports past history   . Sexual activity: Not on file  Lifestyle  . Physical activity:    Days per week: Not on file    Minutes per session: Not on file  . Stress: Not on file  Relationships  . Social connections:    Talks on phone: Not on file    Gets together: Not on file    Attends religious service: Not on file    Active member of club or organization: Not on file    Attends meetings of clubs or organizations: Not on file   Relationship status: Not on file  . Intimate partner violence:    Fear of current or ex partner: Not on file    Emotionally abused: Not on file    Physically abused: Not on file    Forced sexual activity: Not on file  Other Topics Concern  . Not on file  Social History Narrative  . Not on file   No outpatient medications have been marked as taking for the 02/02/18 encounter [facility] Encounter).   Allergies  Allergen Reactions  . Ibuprofen Other (See Comments) and Rash    Makes his throat get "very dry" after taking it States he cant take it  . Other Other (See Comments)    Steroids: Patient received a steroid shot from [facility] [phone] and it resulted in his muscles twitching all over his body  . Zofran [Ondansetron Hcl] Other (See Comments)    Causes sweating and body "feels badly"      ROS: As per HPI, remainder of ROS negative.   OBJECTIVE:   Vitals:   02/02/18 1311  BP: 128/89  Pulse: 89  Resp: 18  Temp:  98 F (36.7 C)  TempSrc: Oral  SpO2: 97%     General appearance: alert; no distress Eyes: PERRL; EOMI; conjunctiva normal HENT: normocephalic; atraumatic; ; oral mucosa normal Neck: supple Lungs: clear to auscultation bilaterally Heart: regular rate and rhythm Back: no CVA tenderness Extremities: no cyanosis or edema; symmetrical with no gross deformities; palpation of left trapezius shows some tension in the muscle and some tenderness. There is no tenderness around the neck and patient has full range of motion of his left shoulder. Skin: warm and dry; mild papular rash on forearms, left greater than right Neurologic: normal gait; grossly normal Psychological: alert and cooperative; normal mood and affect      Labs:  Results for orders placed or performed during the hospital encounter of 01/27/18  Basic metabolic panel  Result Value Ref Range   Sodium 138 135 - 145 mmol/L   Potassium 3.6 3.5 - 5.1 mmol/L   Chloride 103 101 -  111 mmol/L   CO2 25 22 - 32 mmol/L   Glucose, Bld 152 (H) 65 - 99 mg/dL   BUN 7 6 - 20 mg/dL   Creatinine, Ser 4.13 0.61 - 1.24 mg/dL   Calcium 9.1 8.9 - 24.4 mg/dL   GFR calc non Af Amer >60 >60 mL/min   GFR calc Af Amer >60 >60 mL/min   Anion gap 10 5 - 15  CBC  Result Value Ref Range   WBC 7.2 4.0 - 10.5 K/uL   RBC 5.13 4.22 - 5.81 MIL/uL   Hemoglobin 13.6 13.0 - 17.0 g/dL   HCT 01.0 27.2 - 53.6 %   MCV 81.3 78.0 - 100.0 fL   MCH 26.5 26.0 - 34.0 pg   MCHC 32.6 30.0 - 36.0 g/dL   RDW 64.4 03.4 - 74.2 %   Platelets 330 150 - 400 K/uL    Labs Reviewed - No data to display  No results found.     ASSESSMENT & PLAN:  1. Strain of left shoulder, initial encounter   2. Heat rash     Meds ordered this encounter  Medications  . predniSONE (DELTASONE) 20 MG tablet    Sig: Two daily with food    Dispense:  10 tablet    Refill:  0    Reviewed expectations re: course of current medical issues. Questions answered. Outlined signs and symptoms indicating need for more acute intervention. Patient verbalized understanding. After Visit Summary given.    Procedures:      Elvina Sidle, MD 02/02/18 1330

## 2018-02-02 NOTE — ED Triage Notes (Signed)
Pt here for left shoulder pain upon waking this am

## 2018-02-18 ENCOUNTER — Encounter (HOSPITAL_COMMUNITY): Payer: Self-pay | Admitting: Emergency Medicine

## 2018-02-18 ENCOUNTER — Ambulatory Visit (HOSPITAL_COMMUNITY)
Admission: EM | Admit: 2018-02-18 | Discharge: 2018-02-18 | Disposition: A | Discharge status: Home / Self Care | Payer: Self-pay | Attending: Family Medicine | Admitting: Family Medicine

## 2018-02-18 ENCOUNTER — Encounter (HOSPITAL_COMMUNITY): Payer: Self-pay

## 2018-02-18 DIAGNOSIS — H9201 Otalgia, right ear: Secondary | ICD-10-CM

## 2018-02-18 DIAGNOSIS — Z5321 Procedure and treatment not carried out due to patient leaving prior to being seen by health care provider: Secondary | ICD-10-CM | POA: Insufficient documentation

## 2018-02-18 DIAGNOSIS — R030 Elevated blood-pressure reading, without diagnosis of hypertension: Secondary | ICD-10-CM | POA: Insufficient documentation

## 2018-02-18 MED ORDER — FLUTICASONE PROPIONATE 50 MCG/ACT NA SUSP: 1.0000 | Freq: Every day | NASAL | 2 refills | Status: DC

## 2018-02-18 MED ORDER — CETIRIZINE HCL 10 MG PO TABS: 10.0000 mg | ORAL_TABLET | Freq: Every day | ORAL | 0 refills | Status: DC

## 2018-02-18 NOTE — ED Triage Notes (Signed)
Pt sts right ear pain and some dizziness starting today

## 2018-02-18 NOTE — Discharge Instructions (Addendum)
Use of daily nasal spray daily to help with ear pain.  May also try daily zyrtec to help with this. Ibuprofen for pain as needed. Drink plenty of water.  If symptoms worsen or do not improve in the next week to return to be seen or to follow up with your PCP.

## 2018-02-18 NOTE — ED Provider Notes (Signed)
[facility]    CSN: [id number] Arrival date & time: 02/18/18  1447     History   Chief Complaint Chief Complaint  Patient presents with  . Otalgia    HPI Wesley [name] is a 52 y.o. male.   Wesley [name] presents with complaints of right ear pain. States this started approximately 3 days ago. Worse when he lays down on it, throbs. Denies cough, congestion, fever. Has not taken any medications for his pain. Denies drug or alcohol use. Takes BP medication and lorazepam for anxiety. States he feels dizzy if he is bent over and stands. States he has been drinking plenty of fluids. States he feels anxious at nights. No cough or shortness of breath , no chest pain  Or palpitations. Chronic neck pain, dm, htn, dizziness, anxiety per history.     ROS per HPI.      Past Medical History:  Diagnosis Date  . Alcohol abuse   . Anxiety   . Anxiety   . Chronic neck pain   . Cocaine abuse (HCC)   . Diabetes mellitus without complication (HCC)   . Hypertension     Patient Active Problem List   Diagnosis Date Noted  . Legally blind in right eye, as defined in [place] 05/02/2017  . Prediabetes 04/23/2017  . Muscle spasm 04/23/2017  . Essential hypertension 04/16/2017  . H/O medication noncompliance 04/02/2017  . Vitamin D deficiency 04/02/2017  . DJD (degenerative joint disease) of cervical spine 03/16/2017  . 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 Dizziness 01/23/2017  . Anxiety 11/21/2016    Past Surgical History:  Procedure Laterality Date  . EYE SURGERY Right ~ 1980   "hit my eye"       Home Medications    Prior to Admission medications   Medication Sig Start Date End Date Taking? Authorizing Provider  acetaminophen (TYLENOL) 500 MG tablet Take 1,000 mg by mouth daily as needed (back pain).    [provider]  amLODipine (NORVASC) 10 MG tablet Take 1 tablet (10 mg total) by mouth daily. 01/01/18   Mardella Layman, MD  cetirizine (ZYRTEC) 10 MG tablet Take 1 tablet (10 mg  total) by mouth daily. 02/18/18   Georgetta Haber, NP  fluticasone (FLONASE) 50 MCG/ACT nasal spray Place 1 spray into both nostrils daily. 02/18/18   Georgetta Haber, NP  gabapentin (NEURONTIN) 300 MG capsule Take 1 capsule (300 mg total) by mouth at bedtime. 10/14/17   Elvina Sidle, MD  lidocaine (LIDODERM) 5 % Place 1 patch onto the skin daily. Remove & Discard patch within 12 hours or as directed by MD 10/22/17   Demetrios Loll T, PA-C  LORazepam (ATIVAN) 0.5 MG tablet Take 1 tablet (0.5 mg total) by mouth 2 (two) times daily. for anxiety 08/05/17   Elvina Sidle, MD  methocarbamol (ROBAXIN) 500 MG tablet Take 1 tablet (500 mg total) by mouth 2 (two) times daily. 10/14/17   Joy, Shawn C, PA-C  pantoprazole (PROTONIX) 20 MG tablet Take 1 tablet (20 mg total) by mouth daily. 10/11/17 11/10/17  Georgetta Haber, NP  predniSONE (DELTASONE) 20 MG tablet Two daily with food 02/02/18   Elvina Sidle, MD  traMADol (ULTRAM) 50 MG tablet Take 1 tablet (50 mg total) by mouth every 6 (six) hours as needed for severe pain. 12/30/17   Ofilia Neas, PA-C    Family History Family History  Problem Relation Age of Onset  . Hypertension Mother   . Hypertension Father  Social History Social History   Tobacco Use  . Smoking status: Never Smoker  . Smokeless tobacco: Never Used  Substance Use Topics  . Alcohol use: No    Comment: former  . Drug use: No    Comment: denies, reports past history      Allergies   Ibuprofen; Other; and Zofran [ondansetron hcl]   Review of Systems Review of Systems   Physical Exam Triage Vital Signs ED Triage Vitals  Enc Vitals Group     BP 02/18/18 1538 137/89     Pulse Rate 02/18/18 1538 (!) 103     Resp 02/18/18 1538 18     Temp 02/18/18 1538 98.1 F (36.7 C)     Temp Source 02/18/18 1538 Oral     SpO2 02/18/18 1538 100 %     Weight --      Height --      Head Circumference --      Peak Flow --      Pain Score 02/18/18 1539 6     Pain Loc  --      Pain Edu? --      Excl. in GC? --    No data found.  Updated Vital Signs BP 137/89 (BP Location: Left Arm)   Pulse (!) 103   Temp 98.1 F (36.7 C) (Oral)   Resp 18   SpO2 100%    Physical Exam  Constitutional: He is oriented to person, place, and time. He appears well-developed and well-nourished.  HENT:  Head: Normocephalic and atraumatic.  Right Ear: Tympanic membrane, external ear and ear canal normal.  Left Ear: Tympanic membrane, external ear and ear canal normal.  Nose: Nose normal. Right sinus exhibits no maxillary sinus tenderness and no frontal sinus tenderness. Left sinus exhibits no maxillary sinus tenderness and no frontal sinus tenderness.  Mouth/Throat: Uvula is midline, oropharynx is clear and moist and mucous membranes are normal.  Slight right ear effusion noted   Eyes: Pupils are equal, round, and reactive to light. Conjunctivae are normal.  Neck: Normal range of motion.  Cardiovascular: Regular rhythm. Tachycardia present.  Hr 100   Pulmonary/Chest: Effort normal and breath sounds normal.  Lymphadenopathy:    He has no cervical adenopathy.  Neurological: He is alert and oriented to person, place, and time. No cranial nerve deficit or sensory deficit. Coordination normal.  Ambulatory without difficulty; patient bending over and standing to demonstrate what makes him dizzy- tolerating without any difficulty   Skin: Skin is warm and dry.  Vitals reviewed.    UC Treatments / Results  Labs (all labs ordered are listed, but only abnormal results are displayed) Labs Reviewed - No data to display  EKG None  Radiology No results found.  Procedures Procedures (including critical care time)  Medications Ordered in UC Medications - No data to display  Initial Impression / Assessment and Plan / UC Course  I have reviewed the triage vital signs and the nursing notes.  Pertinent labs & imaging results that were available during my care of the  patient were reviewed by me and considered in my medical decision making (see chart for details).     Non toxic in appearance. No red flag findings on exam. Was evaluated in ED on 6/7 for dizziness and labs and cxr without acute findings. flonase to help with ear pain as well as daily zyrtec, this may help with slight dizziness as well, encouraged to increase fluid intake. Return precautions provided. Patient verbalized  understanding and agreeable to plan.  Ambulatory out of clinic without difficulty.    Final Clinical Impressions(s) / UC Diagnoses   Final diagnoses:  Otalgia of right ear     Discharge Instructions     Use of daily nasal spray daily to help with ear pain.  May also try daily zyrtec to help with this. Ibuprofen for pain as needed. Drink plenty of water.  If symptoms worsen or do not improve in the next week to return to be seen or to follow up with your PCP.     ED Prescriptions    Medication Sig Dispense Auth. Provider   fluticasone (FLONASE) 50 MCG/ACT nasal spray Place 1 spray into both nostrils daily. 16 g Linus Mako B, NP   cetirizine (ZYRTEC) 10 MG tablet Take 1 tablet (10 mg total) by mouth daily. 30 tablet Georgetta Haber, NP     Controlled Substance Prescriptions [place] Controlled Substance Registry consulted? Not Applicable   Georgetta Haber, NP 02/18/18 [phone]

## 2018-02-19 ENCOUNTER — Emergency Department (HOSPITAL_COMMUNITY)
Admission: EM | Admit: 2018-02-19 | Discharge: 2018-02-19 | Disposition: A | Discharge status: Home / Self Care | Payer: Self-pay | Attending: Emergency Medicine | Admitting: Emergency Medicine

## 2018-02-19 NOTE — ED Notes (Signed)
Pt not seen in the lobby  

## 2018-02-22 ENCOUNTER — Encounter: Payer: Self-pay | Admitting: Nurse Practitioner

## 2018-02-22 ENCOUNTER — Ambulatory Visit: Payer: Self-pay | Attending: Nurse Practitioner | Admitting: Nurse Practitioner

## 2018-02-22 VITALS — BP 145/95 | HR 97 | Temp 98.1°F | Ht 68.0 in | Wt 196.4 lb

## 2018-02-22 DIAGNOSIS — M25519 Pain in unspecified shoulder: Secondary | ICD-10-CM | POA: Insufficient documentation

## 2018-02-22 DIAGNOSIS — R110 Nausea: Secondary | ICD-10-CM | POA: Insufficient documentation

## 2018-02-22 DIAGNOSIS — F329 Major depressive disorder, single episode, unspecified: Secondary | ICD-10-CM | POA: Insufficient documentation

## 2018-02-22 DIAGNOSIS — R7303 Prediabetes: Secondary | ICD-10-CM

## 2018-02-22 DIAGNOSIS — Z79899 Other long term (current) drug therapy: Secondary | ICD-10-CM | POA: Insufficient documentation

## 2018-02-22 DIAGNOSIS — Z8249 Family history of ischemic heart disease and other diseases of the circulatory system: Secondary | ICD-10-CM | POA: Insufficient documentation

## 2018-02-22 DIAGNOSIS — E119 Type 2 diabetes mellitus without complications: Secondary | ICD-10-CM | POA: Insufficient documentation

## 2018-02-22 DIAGNOSIS — Z886 Allergy status to analgesic agent status: Secondary | ICD-10-CM | POA: Insufficient documentation

## 2018-02-22 DIAGNOSIS — Z760 Encounter for issue of repeat prescription: Secondary | ICD-10-CM | POA: Insufficient documentation

## 2018-02-22 DIAGNOSIS — Z888 Allergy status to other drugs, medicaments and biological substances status: Secondary | ICD-10-CM | POA: Insufficient documentation

## 2018-02-22 DIAGNOSIS — Z7952 Long term (current) use of systemic steroids: Secondary | ICD-10-CM | POA: Insufficient documentation

## 2018-02-22 DIAGNOSIS — Z79891 Long term (current) use of opiate analgesic: Secondary | ICD-10-CM | POA: Insufficient documentation

## 2018-02-22 DIAGNOSIS — F419 Anxiety disorder, unspecified: Secondary | ICD-10-CM | POA: Insufficient documentation

## 2018-02-22 DIAGNOSIS — R42 Dizziness and giddiness: Secondary | ICD-10-CM | POA: Insufficient documentation

## 2018-02-22 DIAGNOSIS — R109 Unspecified abdominal pain: Secondary | ICD-10-CM | POA: Insufficient documentation

## 2018-02-22 DIAGNOSIS — I10 Essential (primary) hypertension: Secondary | ICD-10-CM | POA: Insufficient documentation

## 2018-02-22 DIAGNOSIS — M542 Cervicalgia: Secondary | ICD-10-CM | POA: Insufficient documentation

## 2018-02-22 LAB — GLUCOSE, POCT (MANUAL RESULT ENTRY): POC Glucose: 134 mg/dl — AB (ref 70–99)

## 2018-02-22 LAB — POCT GLYCOSYLATED HEMOGLOBIN (HGB A1C): Hemoglobin A1C: 6.2 % — AB (ref 4.0–5.6)

## 2018-02-22 MED ORDER — LISINOPRIL 5 MG PO TABS: 5.0000 mg | ORAL_TABLET | Freq: Every day | ORAL | 3 refills | Status: DC

## 2018-02-22 MED ORDER — AMLODIPINE BESYLATE 10 MG PO TABS: 10.0000 mg | ORAL_TABLET | Freq: Every day | ORAL | 1 refills | Status: DC

## 2018-02-22 MED ORDER — MECLIZINE HCL 25 MG PO TABS: 25.0000 mg | ORAL_TABLET | Freq: Three times a day (TID) | ORAL | 0 refills | Status: DC | PRN

## 2018-02-22 MED ORDER — CYCLOBENZAPRINE HCL 10 MG PO TABS: 10.0000 mg | ORAL_TABLET | Freq: Three times a day (TID) | ORAL | 0 refills | Status: DC | PRN

## 2018-02-22 MED ORDER — PROMETHAZINE HCL 25 MG PO TABS: 25.0000 mg | ORAL_TABLET | Freq: Three times a day (TID) | ORAL | 0 refills | Status: DC | PRN

## 2018-02-22 MED ORDER — FLUTICASONE PROPIONATE 50 MCG/ACT NA SUSP: 1.0000 | Freq: Every day | NASAL | 2 refills | Status: DC

## 2018-02-22 MED FILL — PROMETHAZINE 25 MG TABLET: 25 | 7 days supply | Qty: 20 | Fill #0

## 2018-02-22 MED FILL — CYCLOBENZAPRINE 10 MG TAB: 10 | 20 days supply | Qty: 60 | Fill #0

## 2018-02-22 MED FILL — LISINOPRIL 5 MG TAB: 5 | 30 days supply | Qty: 30 | Fill #0

## 2018-02-22 MED FILL — FLUTICASONE PROP 50 MCG SPR: 50 | 30 days supply | Qty: 16 | Fill #0

## 2018-02-22 MED FILL — AMLODIPINE BESYLATE 10 MG T: 10 | 30 days supply | Qty: 30 | Fill #0

## 2018-02-22 MED FILL — MECLIZINE 25 MG TABLET: 25 | 10 days supply | Qty: 30 | Fill #0

## 2018-02-22 NOTE — Progress Notes (Signed)
Assessment & Plan:  Wesley [name] was seen today for establish care and medication refill.  Diagnoses and all orders for this visit:  Dizziness -     fluticasone (FLONASE) 50 MCG/ACT nasal spray; Place 1 spray into both nostrils daily. -     promethazine (PHENERGAN) 25 MG tablet; Take 1 tablet (25 mg total) by mouth every 8 (eight) hours as needed for nausea or vomiting. -     meclizine (ANTIVERT) 25 MG tablet; Take 1 tablet (25 mg total) by mouth 3 (three) times daily as needed for dizziness.  Essential hypertension -     lisinopril (PRINIVIL,ZESTRIL) 5 MG tablet; Take 1 tablet (5 mg total) by mouth daily. -     amLODipine (NORVASC) 10 MG tablet; Take 1 tablet (10 mg total) by mouth daily. Continue all antihypertensives as prescribed.  Remember to bring in your blood pressure log with you for your follow up appointment.  DASH/Mediterranean Diets are healthier choices for HTN.   Prediabetes A1c slightly increased from 6.0 to 6.2.  -     Glucose (CBG) -     HgB A1c -     Lipid panel Continue blood sugar control as discussed in office today, low carbohydrate diet, and regular physical exercise as tolerated, 150 minutes per week (30 min each day, 5 days per week, or 50 min 3 days per week).  Annual eye exams and foot exams are recommended.  Neck and shoulder pain -     cyclobenzaprine (FLEXERIL) 10 MG tablet; Take 1 tablet (10 mg total) by mouth 3 (three) times daily as needed for muscle spasms.  May alternate with heat and ice application for pain relief. May also alternate with acetaminophen  as prescribed for  pain.     Patient has been counseled on age-appropriate routine health concerns for screening and prevention. These are reviewed and up-to-date. Referrals have been placed accordingly. Immunizations are up-to-date or declined.    Subjective:   Chief Complaint  Patient presents with  . Establish Care    Pt. is here to establish care for hypertension. Pt. stated he feel nauseous  and stomach pain.   . Medication Refill   HPI Wesley [name] 52 y.o. male presents to office today to establish care. He has a history of substance abuse (ETOH), depression and anxiety (sees a psychiatrist who is prescribing ativan)  DM and HTN.    Dizziness Patient was seen in the emergency room on 02-15-2018; 02-18-2018; 02/19/2018 with complaints of dizziness and right otalgia.  He was treated with Ciprodex eardrops for right swimmer's ear on 02-19-2018 however all previous workups prior to this had been negative. He has not picked up his ciprodex prescription yet. Today he reports persistent dizziness with duration of 20 minutes. Aggravating factors: none. Relieving factors: None. The dizziness has been present for a few weeks. The patient describes the symptoms as disequalibirum. The patient also complains of otalgia. Patient denies otorrhea, tinnitus, hearing loss.  He has been treated with meclizine (Antivert he has not started taking), anti-inflammatory nasal sprays; flonase (he is not taking). He was also encouraged to take zyrtec however he has not started on any antihistamines.   Essential Hypertension Chronic. Not at goal today. Will add lisinopril and f/u in a few weeks. Denies chest pain, shortness of breath, palpitations, lightheadedness, dizziness, headaches or BLE edema.  BP Readings from Last 3 Encounters:  02/22/18 (!) 145/95  02/18/18 (!) 135/95  02/18/18 137/89    Neck and Shoulder Pain Event  that precipitate these symptoms: none known. Onset of symptoms a few weeks ago, unchanged since that time. Current symptoms are pain in occipital area, posterior neck and bilateral shoulders (aching in character; 6/10 in severity) and stiffness in neck and shoulders. Patient denies numbness in back, paresthesias in back and weakness in back. Patient has had no prior neck problems.  Previous treatments include: none.   Review of Systems  Constitutional: Negative for fever, malaise/fatigue and  weight loss.  HENT: Positive for ear pain (right ear). Negative for ear discharge, hearing loss, nosebleeds and tinnitus.   Eyes: Negative.  Negative for blurred vision, double vision and photophobia.  Respiratory: Negative.  Negative for cough and shortness of breath.   Cardiovascular: Negative.  Negative for chest pain, palpitations and leg swelling.  Gastrointestinal: Positive for nausea (chronic). Negative for heartburn and vomiting.  Musculoskeletal: Positive for myalgias and neck pain.  Neurological: Positive for dizziness and headaches. Negative for focal weakness and seizures.  Psychiatric/Behavioral: Negative for suicidal ideas. The patient is nervous/anxious.     Past Medical History:  Diagnosis Date  . Alcohol abuse   . Anxiety   . Anxiety   . Chronic neck pain   . Cocaine abuse (HCC)   . Hypertension   . Prediabetes     Past Surgical History:  Procedure Laterality Date  . EYE SURGERY Right ~ 1980   "hit my eye"    Family History  Problem Relation Age of Onset  . Hypertension Mother   . Hypertension Father     Social History Reviewed with no changes to be made today.   Outpatient Medications Prior to Visit  Medication Sig Dispense Refill  . LORazepam (ATIVAN) 0.5 MG tablet Take 1 tablet (0.5 mg total) by mouth 2 (two) times daily. for anxiety 60 tablet 1  . amLODipine (NORVASC) 10 MG tablet Take 1 tablet (10 mg total) by mouth daily. 30 tablet 0  . acetaminophen (TYLENOL) 500 MG tablet Take 1,000 mg by mouth daily as needed (back pain).    . cetirizine (ZYRTEC) 10 MG tablet Take 1 tablet (10 mg total) by mouth daily. (Patient not taking: Reported on 02/22/2018) 30 tablet 0  . fluticasone (FLONASE) 50 MCG/ACT nasal spray Place 1 spray into both nostrils daily. (Patient not taking: Reported on 02/22/2018) 16 g 2  . gabapentin (NEURONTIN) 300 MG capsule Take 1 capsule (300 mg total) by mouth at bedtime. (Patient not taking: Reported on 02/22/2018) 21 capsule 1  .  lidocaine (LIDODERM) 5 % Place 1 patch onto the skin daily. Remove & Discard patch within 12 hours or as directed by MD (Patient not taking: Reported on 02/22/2018) 30 patch 0  . methocarbamol (ROBAXIN) 500 MG tablet Take 1 tablet (500 mg total) by mouth 2 (two) times daily. (Patient not taking: Reported on 02/22/2018) 20 tablet 0  . pantoprazole (PROTONIX) 20 MG tablet Take 1 tablet (20 mg total) by mouth daily. 30 tablet 0  . predniSONE (DELTASONE) 20 MG tablet Two daily with food (Patient not taking: Reported on 02/22/2018) 10 tablet 0  . traMADol (ULTRAM) 50 MG tablet Take 1 tablet (50 mg total) by mouth every 6 (six) hours as needed for severe pain. (Patient not taking: Reported on 02/22/2018) 15 tablet 0   No facility-administered medications prior to visit.     Allergies  Allergen Reactions  . Ibuprofen Other (See Comments) and Rash    Makes his throat get "very dry" after taking it States he cant take it  .  Other Other (See Comments)    Steroids: Patient received a steroid shot from [facility] [phone] [id number] and it resulted in his muscles twitching all over his body  . Zofran [Ondansetron Hcl] Other (See Comments)    Causes sweating and body "feels badly"       Objective:    BP (!) 145/95 (BP Location: Left Arm, Patient Position: Sitting, Cuff Size: Normal)   Pulse 97   Temp 98.1 F (36.7 C) (Oral)   Ht 5\' 8"  (1.727 m)   Wt 196 lb 6.4 oz (89.1 kg)   SpO2 100%   BMI 29.86 kg/m  Wt Readings from Last 3 Encounters:  02/22/18 196 lb 6.4 oz (89.1 kg)  02/18/18 198 lb (89.8 kg)  10/14/17 192 lb (87.1 kg)    Physical Exam  Constitutional: He is oriented to person, place, and time. He appears well-developed and well-nourished. He is cooperative.  HENT:  Head: Normocephalic and atraumatic.  Right Ear: Hearing, tympanic membrane, external ear and ear canal normal.  Left Ear: Hearing, tympanic membrane, external ear and ear canal normal.  Ear exam is normal  Eyes: EOM  are normal.  Neck: Normal range of motion.  Cardiovascular: Normal rate, regular rhythm, normal heart sounds and intact distal pulses. Exam reveals no gallop and no friction rub.  No murmur heard. Pulmonary/Chest: Effort normal and breath sounds normal. No stridor. No tachypnea. No respiratory distress. He has no decreased breath sounds. He has no wheezes. He has no rhonchi. He has no rales. He exhibits no tenderness.  Abdominal: Bowel sounds are normal.  Musculoskeletal: Normal range of motion. He exhibits no edema.  Neurological: He is alert and oriented to person, place, and time. He displays a negative Romberg sign. Coordination and gait normal.  Skin: Skin is warm and dry.  Psychiatric: He has a normal mood and affect. His behavior is normal. Judgment and thought content normal.  Nursing note and vitals reviewed.      Patient has been counseled extensively about nutrition and exercise as well as the importance of adherence with medications and regular follow-up. The patient was given clear instructions to go to ER or return to medical center if symptoms don't improve, worsen or new problems develop. The patient verbalized understanding.   Follow-up: Return in about 3 weeks (around 03/15/2018) for BP recheck; dizziness.   Claiborne Rigg, FNP-BC [facility] [place], [place] [phone]   02/22/2018, 12:49 PM

## 2018-02-22 NOTE — Patient Instructions (Addendum)
Plan de alimentacin para la prediabetes (Prediabetes Eating Plan) La prediabetes, tambin llamada intolerancia a la glucosa o alteracin de la glucosa en ayunas, es una afeccin que eleva los niveles de azcar en la sangre (glucemia) por encima de lo normal. Seguir una dieta saludable puede ayudar a mantener la prediabetes bajo control, y tambin reduce el riesgo de tener diabetes tipo2 y cardiopata, que es ms alto en las personas que tienen esta afeccin. Junto con la actividad fsica habitual, una dieta saludable:  Promueve la prdida de [place].  Ayuda a Medical sales representative de Banker.  Ayuda a mejorar la forma en que el organismo [place] la insulina. QU DEBO SABER ACERCA DE ESTE PLAN DE ALIMENTACIN?  Use el ndice glucmico (IG) para planificar las comidas. El ndice le informa con qu rapidez un alimento elevar su nivel de azcar en la sangre. Elija los alimentos con bajo IG. Estos tardan ms tiempo en subir el nivel de azcar en la sangre.  Preste mucha atencin a la cantidad de hidratos de carbono que hay en los alimentos que consume. Los hidratos de carbono OfficeMax Incorporated niveles de Banker.  Lleve un registro de la cantidad de caloras que ingiere. Ingerir la cantidad correcta de caloras lo ayudar a Cabin crew peso saludable. Bajar alrededor del 7por ciento del peso inicial puede ayudar a Automotive engineer la diabetes tipo2.  Tal vez deba seguir Clinical cytogeneticist. Esta incluye una gran cantidad de verduras, carnes magras o pescado, cereales integrales, frutas, as como aceites y grasas saludables.  QU ALIMENTOS PUEDO COMER? Cereales Cereales integrales, como panes, galletas, cereales y pastas de salvado o integrales. Avena sin azcar. Trigo burgol. Cebada. Quinua. Arroz integral. Tortillas o tacos de harina de maz o de salvado. Hoover Brunette Deatra James. Espinaca. Guisantes. Remolachas. Coliflor. Repollo. Brcoli. Zanahorias. Tomates. Cal[place]aza. Christella Noa. Hierbas.  Pimientos. Cebollas. Pepinos. Repollitos de Bruselas. Frutas Frutos rojos. Bananas. Manzanas. Naranjas. Uvas. Papaya. Mango. Granada. Kiwi. Pomelo. Cerezas. Carnes y otras fuentes de protenas Mariscos. Carnes [place], entre ellas, pollo y [place] o cortes magros de carne de cerdo y de Paraje. Tofu. Huevos. Los frutos secos. Frijoles. Lcteos Productos lcteos descremados o semidescremados, como yogur, queso cottage y [place]. CHS Inc. T. Caf. Gaseosas sin azcar o dietticas. Agua de [place]. Leche. Productos alternativos de la [place], [address] de soja o de Mehama. Condimentos [place]. Salsa de pepinillos. [place] con bajo contenido de [place] (the territory South of 60 deg S) y de International aid/development worker. Salsa barbacoa con bajo contenido de grasa y de azcar. Mayonesa sin grasa o con bajo contenido de [place]. Dulces y postres Budines sin azcar o con bajo contenido de [place]. Helados y otros dulces congelados sin azcar o con bajo contenido de [place]. Grasas y Arts development officer. Nueces. Aceite de oliva. Los artculos mencionados arriba pueden no ser Raytheon de las bebidas o los alimentos recomendados. Comunquese con el nutricionista para conocer ms opciones. QU ALIMENTOS NO SE RECOMIENDAN? Cereales Productos a base de [place] y de [place], como panes, pastas, bocadillos y cereales. Bebidas Bebidas azucaradas, como t helado y gaseosas con International aid/development worker. Dulces y [address] de [place], [place] tortas, [place], Casa [place], Programmer, systems y tarta de [place]. Los artculos mencionados arriba pueden no ser Raytheon de las bebidas y los alimentos que se Theatre stage manager. Comunquese con el nutricionista para obtener ms informacin. Esta informacin no tiene Theme park manager el consejo del mdico. Asegrese de hacerle al mdico cualquier pregunta que tenga. Document Released: 05/19/2015 Document Revised: 05/19/2015 Document Reviewed: 09/23/2014 Elsevier  Interactive Patient Education  2017 Elsevier  Inc.  Prediabetes (Prediabetes) QU ES LA PREDIABETES? La prediabetes es la enfermedad que presenta un nivel de azcar en la sangre (glucemia) ms alto de lo normal, pero no lo suficientemente alto como para que le diagnostiquen diabetes tipo2. El hecho de ser prediabtico lo pone en riesgo de desarrollar diabetes tipo2 (diabetes mellitus tipo2). La prediabetes tambin se puede llamar intolerancia a la glucosa o glucosa alterada en ayunas. Generalmente, la prediabetes no causa sntomas. El mdico puede diagnosticar esta enfermedad por los anlisis de [place]. Los anlisis para Engineer, manufacturing la prediabetes se pueden realizar si usted tiene sobrepeso y si presenta al menos un factor de riesgo ms de prediabetes. Entre los factores de riesgo de prediabetes, se incluyen los siguientes:  Warehouse manager un familiar con diabetes tipo2.  Sobrepeso u obesidad.  Tener ms de 45 aos.  Ser descendiente de indgenas norteamericanos, afroamericanos, hispanos o latinos, o asiticos o isleos del Pacfico.  Tener un estilo de vida inactivo (sedentario).  Tener antecedentes de diabetes gestacional o sndrome de ovario poliqustico (SOP).  Tener niveles bajos del colesterol bueno (HDL-C) o niveles altos de grasas en la sangre (triglicridos).  Tener hipertensin arterial. QU ES LA GLUCEMIA Y CMO SE MIDE? La glucemia hace referencia a la cantidad de glucosa que tiene en el torrente sanguneo. La glucosa proviene de los alimentos que contienen azcar y almidn (carbohidratos) que el organismo descompone para formar glucosa. El nivel de glucemia se puede medir en mg/dl (miligramos por decilitro) o mmol/l (milimoles por litro).La glucemia puede controlarse con uno o ms de los siguientes anlisis de sangre:  Medicin de la glucemia en [place]. No se le permitir comer (tendr que [employer] Energy) durante al menos 8horas antes de que se tome una [place] de [place]. ? Un rango normal de glucemia en ayunas es de 70 a  100mg /dl (de 3,9 a 1,6XWRU/E).  Un anlisis de sangre de A1c (hemoglobina A1c). Este anlisis proporciona informacin sobre el control de la glucemia durante los ltimos 2 o .  Prueba de tolerancia a la glucosa oral (PTGO). Esta prueba mide la glucemia dos veces: ? Despus del ayuno. Este es el valor inicial. ? Dos horas despus de ingerir una bebida que contiene glucosa. Pueden diagnosticarle prediabetes en los siguientes casos:  Si la glucemia en ayunas es de 100 a 125mg /dl (de 5,6 a 4,5WUJW/J).  Si el nivel de A1c es del 5,7% al 6,4%.  Si el resultado de la PTGO es de 140 a 199mg /dl (de 7,8 a 19JYNW/G). Estos anlisis de sangre se pueden repetir para Pharmacist, hospital diagnstico. QU SUCEDE SI LA GLUCEMIA ES DEMASIADO ALTA? El pncreas produce una hormona (insulina) que ayuda a Merchant navy officer glucosa desde el torrente sanguneo hacia las clulas. Cuando las clulas no responden de forma [place] a la insulina que el organismo produce (resistencia a la insulina), el exceso de glucosa se acumula en la sangre en vez de dirigirse hacia las clulas. Como consecuencia, se puede desarrollar glucemia alta (hiperglucemia), que puede causar muchas complicaciones. Este es uno de los sntomas de la prediabetes. QU PUEDE SUCEDER SI LA GLUCEMIA PERMANECE MS ALTA DE LO NORMAL DURANTE MUCHO TIEMPO? Es peligroso Public librarian glucemia alta durante mucho tiempo. Demasiada glucosa en la sangre puede daar los nervios y los vasos sanguneos. El dao a largo plazo puede provocar complicaciones de la diabetes, por ejemplo:  Cardiopata.  Ictus.  Ceguera.  Enfermedad renal.  Depresin.  Mala circulacin en los pies y en las piernas,  que podra llevar a la extraccin quirrgica (amputacin) en casos graves. CMO SE PUEDE EVITAR QUE LA PREDIABETES SE CONVIERTA EN DIABETES TIPO2? Para prevenir la diabetes tipo2, tome las siguientes medidas:  Haga actividad fsica. ? Haga actividad fsica de  intensidad moderada durante al menos como mnimo 5das por [employer], o tanto como le haya indicado el mdico. Podra hacer caminatas dinmicas, ciclismo o [place]. ? Pregntele al mdico qu actividades son seguras para usted. Una combinacin de actividades puede ser la mejor opcin, por ejemplo, caminar, practicar natacin, andar en bicicleta y hacer entrenamiento de fuerza.  Baje de General Electric se lo haya indicado el mdico. ? Bajar entre el 5% y el 7% del peso corporal puede revertir la resistencia a la insulina. ? El mdico puede determinar cuntos kilos tiene que bajar y [place] a que adelgace de Wellsite geologist segura.  Siga un plan de alimentacin saludable. Este incluye consumir protenas magras, hidratos de carbono complejos, frutas y verduras frescas, productos lcteos con bajo contenido de grasa y grasas saludables. ? Siga las indicaciones del mdico respecto de las restricciones para las comidas o las bebidas. ? Programe una cita con un especialista en alimentacin y nutricin (nutricionista certificado) para que lo ayude a Quarry manager plan de alimentacin saludable adecuado para usted.  No fume ni consuma ningn producto que contenga tabaco, lo que incluye cigarrillos, tabaco de Theatre manager y Administrator, Civil Service. Si necesita ayuda para dejar de fumar, consulte al American Express.  Baxter International de venta libre y los recetados como se lo haya indicado el mdico. Es posible que le receten medicamentos que ayuden a disminuir el riesgo de tener diabetes tipo2. Esta informacin no tiene Theme park manager el consejo del mdico. Asegrese de hacerle al mdico cualquier pregunta que tenga. Document Released: 10/19/2015 Document Revised: 10/19/2015 Document Reviewed: 10/19/2015 Elsevier Interactive Patient Education  2018 Arvin[employer]itor.  Prevencin de la diabetes mellitus tipo 2 Preventing Type 2 Diabetes Mellitus La diabetes tipo 2 (diabetes mellitus tipo 2) es una enfermedad a  Equities trader plazo (crnica) que afecta los niveles de azcar en la sangre (glucosa). Normalmente, una hormona denominada insulina permite el ingreso de la glucosa en las clulas del cuerpo. Las clulas usan la glucosa para Psychiatrist. En la diabetes tipo 2, puede presentarse uno de los [address], o ambos:  El cuerpo no produce la cantidad suficiente de insulina.  El cuerpo no responde de [place] a la insulina que produce (resistencia a la insulina).  La resistencia a la insulina o la falta de insulina hacen que el exceso de glucosa se acumule en la sangre, en lugar de ir a las clulas. Como resultado, se produce un nivel alto de glucemia (hiperglucemia), lo cual puede causar muchas complicaciones. Al tener sobreseo o ser obeso y tener un estilo de vida inactivo (sedentario) puede aumentar su riesgo de padecer diabetes. La diabetes tipo 2 se puede retrasar o prevenir al realizar determinados cambios en la alimentacin y el estilo de vida. Qu cambios se pueden Interior and spatial designer?  Consuma comidas y refrigerios saludables con regularidad. Tenga a mano un refrigerio saludable cuando sienta [employer], tal como una fruta o un puado de nueces.  Como carnes [place] y protenas con bajo contenido de grasas saturadas como pollo, pescado, claras de huevo y frijoles. Evite las carnes procesadas.  Coma muchas frutas y verduras, y muchos granos no procesados (granos enteros). Se recomienda que coma: ? 1 a 2 tazas de Corning Incorporated. ?  2 a 3 tazas de Sanmina-SCIverduras todos los das. ? 6 a 8 onzas de granos enteros [address] los das, como avena, Jeromesalvado, trigo De Queenbulgur, arroz integral, quinoa y mijo.  Coma productos lcteos de bajo contenido graso, como Oak[place]leche, yogur y Bassettqueso.  Coma alimentos que contengan grasas saludables, como frutos secos, aguacate, aceite de Donahueoliva y aceite de canola.  Beba agua durante todo Medical laboratory scientific officerel da. Evite los lquidos que contengan azcar agregada como  gaseosas o t Pocahontasdulce.  Siga las indicaciones de su mdico respecto de las restricciones especficas para las comidas o las bebidas.  Controle la cantidad de alimentos que come por vez (tamao de la porcin). ? Verifique las etiquetas de los alimentos para verificar los tamaos de las porciones de los alimentos. ? Use una balanza de cocina para pesar las porciones de alimentos.  Saltee o hierva al vapor los Publishing rights manageralimentos en lugar de frerlos. Cocine con agua o caldo en lugar de aceites o manteca.  Limite su consumo de: ? Sal (sodio). No consuma ms de 1 cucharadita (2,400mg ) de sodio por da. Si tiene enfermedad cardaca o presin arterial alta, debe consumir menos de  a  cucharadita (1,500mg ) de Pharmacist, communitysodio por da. ? Grasas saturadas. Se trata de grasa slida a temperatura Ashlandambiente como la manteca o la grasa de la carne. Qu cambios en el estilo de vida se pueden realizar?  Actividad  Haga actividad fsica de intensidad moderada durante al menos 30minutos como mnimo 5das por semana o con la frecuencia que le indique su mdico.  Pregntele al mdico qu actividades son seguras para usted. Una combinacin de actividades puede ser la mejor opcin, por ejemplo, caminar, practicar natacin, andar en bicicleta y hacer entrenamiento de fuerza.  Trate de agregar actividad fsica a su jornada. Por ejemplo: ? Estacione en lugares ms alejados de lo habitual para tener que caminar ms. Por ejemplo, estacione en una equina alejada del estacionamiento cuando vaya a la oficina o a la tienda de comestibles. ? Vaya a caminar durante su hora de almuerzo. ? Utilice las Microbiologistescaleras en lugar de ascensores o escaleras mecnicas. Bajar de peso  AvingerBaje de peso segn se le indique. El mdico puede determinar cuntos kilos tiene que bajar y  AFBayudarlo a que adelgace de Wellsite geologistmanera segura.  Si tiene sobrepeso o es obeso, es posible que se le indique que pierda al menos entre el 5 y el 7% de su Runner, broadcasting/film/videopeso corporal. Alcohol y  tabaco   Limite el consumo de alcohol a no ms de 1 medida por da si es mujer y no est Orthoptistembarazada y a 2 medidas por da si es hombre. Una medida equivale a 12onzas de cerveza, 5onzas de vino o 1onzas de bebidas alcohlicas de alta graduacin.  No consuma ningn producto que contenga tabaco, lo que incluye cigarrillos, tabaco de Theatre managermascar y Administrator, Civil Servicecigarrillos electrnicos. Si necesita ayuda para dejar de fumar, consulte al mdico. Trabaje con su mdico  Contrlese la glucemia de Juarezmanera regular, segn lo indicado por su mdico.  Analice sus factores de riesgo y cmo puede reducir su riesgo de padecer diabetes.  Somtase a pruebas de Airline pilotdeteccin segn lo indicado por su mdico. Puede realizarse pruebas de deteccin con regularidad, especialmente si tiene ciertos factores de riesgo de padecer diabetes tipo 2.  Haga una cita con un especialista en dietas y nutricin (nutricionista matriculado). Un nutricionista matriculado puede ayudarlo a crear un plan de alimentacin saludable y a comprender los tamaos de las porciones y las etiquetas de los alimentos. Por qu son  importantes estos cambios?  Es posible prevenir o Cytogeneticist diabetes tipo 2 y los problemas de salud relacionados al realizar cambios en el estilo de vida y Psychologist, sport and exercise.  Puede ser difcil reconocer los signos de la diabetes tipo 2. La mejor manera de evitar posibles daos en el cuerpo es tomar medidas para prevenir la enfermedad antes que usted desarrolle los sntomas. Qu puede suceder si no se realizan cambios?  Sus niveles de glucemia pueden continuar aumentando. Tener la glucemia alta durante mucho tiempo es peligroso. Demasiada glucosa en la sangre puede daar los vasos sanguneos, el corazn, los riones, los nervios y los ojos.  Puede desarrollar prediabetes o diabetes tipo 2. La diabetes tipo 2 puede ocasionar muchos problemas de salud crnicos y complicaciones, tales como: ? Enfermedad cardaca. ? Accidente  cerebrovascular. ? Ceguera. ? Enfermedad renal. ? Depresin. ? Circulacin deficiente en los pies y las piernas, lo cual podra ocasionar una extirpacin quirrgica (amputacin) en casos graves. Dnde encontrar apoyo:  Pida a su mdico que le recomiende un nutricionista matriculado, IT trainer en diabetes o un programa para Publishing copy de [place].  Busque grupos de apoyo para bajar de peso a nivel local o en lnea.  Asista a un gimnasio, club de acondicionamiento fsico o nase a un grupo que realice actividad fsica al aire libre como un club de caminata. Dnde encontrar ms informacin: Para obtener ms informacin sobre la diabetes y la prevencin de la diabetes, visite:  Asociacin Americana de la Diabetes (American Diabetes Association, ADA): www.diabetes.org  [employer]ger de la Diabetes y las Enfermedades Digestivas y [place] of Diabetes and Digestive and Kidney Diseases): ToyArticles.ca  Para obtener ms informacin sobre Aflac Incorporated, visite:  Departamento de Agricultura de los EE.UU., Mi plato (U.S. Department of Agriculture, Medical sales representative, Choose My Plate): http://yates.biz/  Oficina para la Prevencin de Enfermedades y Promocin de Freight forwarder, Government social research officer de Paediatric nurse (Office of Disease Prevention and Health Promotion, ODPHP, Dietary Guidelines): ListingMagazine.si  Resumen  Puede reducir su riesgo de padecer diabetes tipo 2 al aumentar su actividad fsica, comer alimentos saludables y Publishing copy de peso segn se lo indiquen.  Hable con su mdico sobre su riesgo de padecer diabetes tipo 2. Pregunte Amgen Inc de sangre o pruebas de deteccin que debe International aid/development worker. Esta informacin no tiene Theme park manager el consejo del mdico. Asegrese de hacerle al mdico cualquier pregunta que tenga. Document Released: 10/19/2015 Document Revised: 12/18/2016 Document Reviewed: 10/19/2015 Elsevier  Interactive Patient Education  2018 Elsevier Inc.  Vrtigo posicional benigno (Benign Positional Vertigo) El vrtigo es la sensacin de que usted o todo lo que lo rodea se mueven cuando en realidad eso no sucede. El vrtigo posicional benigno es el tipo de vrtigo ms comn. La causa de este trastorno no es grave (es benigna). Algunos movimientos y determinadas posiciones pueden desencadenar el trastorno (es posicional). El vrtigo posicional benigno puede ser peligroso si ocurre mientras est haciendo algo que podra suponer un riesgo para usted y para los dems, por ejemplo, conduciendo un automvil. CAUSAS En muchos de los [place], se desconoce la causa de este trastorno. Puede deberse a Hotel manager zona del odo interno que ayuda al cerebro a percibir el movimiento y a Administrator, Civil Service equilibrio. Esta alteracin puede deberse a una infeccin viral (laberintitis), a una lesin en la cabeza o a los movimientos reiterados. FACTORES DE RIESGO Es ms probable que esta afeccin se manifieste en:  Las mujeres.  Las Smith International de 82NFA. SNTOMAS Generalmente, los sntomas de  este trastorno se presentan al mover la cabeza o los ojos en diferentes direcciones. Pueden aparecer repentinamente y suelen durar menos de un minuto. Entre los sntomas se pueden incluir los siguientes:  Prdida del equilibrio y cadas.  Sensacin de estar dando vueltas o movindose.  Sensacin de que el entorno est dando vueltas o movindose.  Nuseas y vmitos.  Visin borrosa.  Mareos.  Movimientos oculares involuntarios (nistagmo). Los sntomas pueden ser leves y algo fastidiosos, o pueden ser graves e interferir en la vida cotidiana. Los episodios de vrtigo posicional benigno pueden repetirse (ser recurrentes) a lo largo del [place], y algunos movimientos pueden desencadenarlos. Los sntomas pueden mejorar con Museum/gallery conservator. DIAGNSTICO Generalmente, este trastorno se diagnostica con una historia  clnica y un examen fsico de la cabeza, el cuello y los odos. Tal vez lo deriven a Catering manager en problemas de la garganta, la nariz y el odo (otorrinolaringlogo), o a uno que se especializa en trastornos del sistema nervioso (neurlogo). Pueden hacerle otros estudios, entre ellos:  Health visitor.  Tomografa computarizada.  Estudios de los Ecolab. El mdico puede pedirle que cambie rpidamente de posicin mientras observa si se presentan sntomas de vrtigo posicional benigno, por ejemplo, nistagmo. Los movimientos oculares se pueden estudiar con una electronistagmografa (ENG), con estimulacin trmica, mediante la maniobra de [place] o con la prueba de rotacin.  Electroencefalograma (EEG). Este estudio registra la actividad elctrica del cerebro.  Pruebas de audicin. Lissa Morales, para tratar este trastorno, el mdico le har movimientos especficos con la cabeza para que el odo interno se normalice. [employer]awk [employer] casos son graves, tal vez haya que realizar una ciruga, pero esto no es frecuente. En algunos casos, el vrtigo posicional benigno se resuelve por s solo en el trmino de 2 o 4semanas. INSTRUCCIONES PARA EL CUIDADO EN EL HOGAR Seguridad  Muvase lentamente.No haga movimientos bruscos con el cuerpo o con la cabeza.  No conduzca.  No opere maquinaria pesada.  No haga ninguna tarea que podra ser peligrosa para usted o para Economist en caso de que ocurriera un episodio de vrtigo.  Si tiene dificultad para caminar o mantener el equilibrio, use un bastn para Photographer estabilidad. Si se siente mareado o inestable, sintese de inmediato.  Reanude sus actividades normales como se lo haya indicado el mdico. Pregntele al mdico qu actividades son seguras para usted. Instrucciones generales  Baxter International de venta libre y los recetados solamente como se lo haya indicado el mdico.  Evite algunas posiciones  o determinados movimientos como se lo haya indicado el mdico.  Beba suficiente lquido para Pharmacologist la orina clara o de color amarillo plido.  Concurra a todas las visitas de control como se lo haya indicado el mdico. Esto es importante. SOLICITE ATENCIN MDICA SI:  Lance Muss.  El trastorno [place], o le aparecen sntomas nuevos.  Sus familiares o amigos advierten cambios en su comportamiento.  Las nuseas o los vmitos empeoran.  Tiene sensacin de hormigueo o de adormecimiento. SOLICITE ATENCIN MDICA DE INMEDIATO SI:  Tiene dificultad para hablar o para moverse.  Esta mareado todo [employer].  Se desmaya.  Tiene dolores de cabeza intensos.  Tiene debilidad en los brazos o las piernas.  Tiene cambios en la audicin o la visin.  Siente rigidez en el cuello.  Tiene sensibilidad a Statistician. Esta informacin no tiene Theme park manager el consejo del mdico. Asegrese de hacerle al mdico cualquier pregunta que tenga. Document Released: 12/14/2008 Document Revised: 05/19/2015  Document Reviewed: 12/21/2014 Elsevier Interactive Patient Education  [employer].

## 2018-02-23 LAB — LIPID PANEL
Chol/HDL Ratio: 5.4 ratio — ABNORMAL HIGH (ref 0.0–5.0)
Cholesterol, Total: 225 mg/dL — ABNORMAL HIGH (ref 100–199)
HDL: 42 mg/dL (ref >39)
LDL Calculated: 117 mg/dL — ABNORMAL HIGH (ref 0–99)
Triglycerides: 331 mg/dL — ABNORMAL HIGH (ref 0–149)
VLDL Cholesterol Cal: 66 mg/dL — ABNORMAL HIGH (ref 5–40)

## 2018-02-26 ENCOUNTER — Other Ambulatory Visit: Payer: Self-pay | Admitting: Nurse Practitioner

## 2018-02-26 MED ORDER — ATORVASTATIN CALCIUM 20 MG PO TABS: 20.0000 mg | ORAL_TABLET | Freq: Every day | ORAL | 3 refills | Status: DC

## 2018-02-28 ENCOUNTER — Telehealth: Payer: Self-pay

## 2018-02-28 NOTE — Telephone Encounter (Signed)
CMA spoke to patient to inform on lab results and RX sent.  Pt. Understood.  Spanish interpreter Aldo [phone] assist with the call.

## 2018-02-28 NOTE — Telephone Encounter (Signed)
-----   Message from Claiborne RiggZelda W Fleming, NP sent at 02/26/2018 11:07 PM EDT ----- Please call patient: Tests show increased cholesterol/lipid levels. Will need to start on statin or cholesterol/lipid lowering medication. Prescription has been sent to the pharmacy. Patient should work on a low fat, heart healthy diet and participate in regular aerobic exercise program to control as well by working out at least 150 minutes per week. No fried foods. No junk foods, sodas, sugary drinks, unhealthy snacking, or smoking.

## 2018-03-07 ENCOUNTER — Encounter (HOSPITAL_COMMUNITY): Payer: Self-pay | Admitting: Emergency Medicine

## 2018-03-07 ENCOUNTER — Other Ambulatory Visit: Payer: Self-pay

## 2018-03-07 ENCOUNTER — Emergency Department (HOSPITAL_COMMUNITY)
Admission: EM | Admit: 2018-03-07 | Discharge: 2018-03-07 | Disposition: A | Discharge status: Home / Self Care | Payer: Self-pay | Attending: Emergency Medicine | Admitting: Emergency Medicine

## 2018-03-07 DIAGNOSIS — R079 Chest pain, unspecified: Secondary | ICD-10-CM | POA: Insufficient documentation

## 2018-03-07 DIAGNOSIS — Z5321 Procedure and treatment not carried out due to patient leaving prior to being seen by health care provider: Secondary | ICD-10-CM | POA: Insufficient documentation

## 2018-03-07 NOTE — ED Notes (Signed)
Called pt. Again, no answer, saw wife sitting with pt earlier in waiting room.  No longer seen waiting at this time.

## 2018-03-07 NOTE — ED Notes (Signed)
Pt LWBS after triage, did not notify staff he was leaving.

## 2018-03-07 NOTE — ED Notes (Signed)
Attempted to call pt name w/o response

## 2018-03-07 NOTE — ED Triage Notes (Signed)
Pt reports dizziness, nausea and rt sided cp that started today. Pt reports 8/10 cp. Hx HTN and anxiety.

## 2018-03-07 NOTE — ED Notes (Signed)
Pt not in waiting room when called for a room

## 2018-03-07 NOTE — ED Notes (Signed)
Pt called for room multiple times, no answer 

## 2018-03-10 ENCOUNTER — Encounter (HOSPITAL_COMMUNITY): Payer: Self-pay

## 2018-03-10 ENCOUNTER — Other Ambulatory Visit: Payer: Self-pay

## 2018-03-10 ENCOUNTER — Emergency Department (HOSPITAL_COMMUNITY)
Admission: EM | Admit: 2018-03-10 | Discharge: 2018-03-10 | Disposition: A | Discharge status: Home / Self Care | Payer: Self-pay | Attending: Emergency Medicine | Admitting: Emergency Medicine

## 2018-03-10 DIAGNOSIS — R42 Dizziness and giddiness: Secondary | ICD-10-CM | POA: Insufficient documentation

## 2018-03-10 DIAGNOSIS — R51 Headache: Secondary | ICD-10-CM | POA: Insufficient documentation

## 2018-03-10 DIAGNOSIS — Z5321 Procedure and treatment not carried out due to patient leaving prior to being seen by health care provider: Secondary | ICD-10-CM | POA: Insufficient documentation

## 2018-03-10 NOTE — ED Notes (Signed)
No answer when called for room 

## 2018-03-10 NOTE — ED Triage Notes (Signed)
Pt reports dizziness, headache and anxiety "all day" today. He reports that he already took his home medications. Hx of same.

## 2018-03-12 ENCOUNTER — Encounter (HOSPITAL_COMMUNITY): Payer: Self-pay

## 2018-03-12 ENCOUNTER — Emergency Department (HOSPITAL_COMMUNITY)
Admission: EM | Admit: 2018-03-12 | Discharge: 2018-03-12 | Disposition: A | Discharge status: Home / Self Care | Payer: Self-pay | Attending: Emergency Medicine | Admitting: Emergency Medicine

## 2018-03-12 ENCOUNTER — Other Ambulatory Visit: Payer: Self-pay

## 2018-03-12 DIAGNOSIS — R030 Elevated blood-pressure reading, without diagnosis of hypertension: Secondary | ICD-10-CM | POA: Insufficient documentation

## 2018-03-12 DIAGNOSIS — I10 Essential (primary) hypertension: Secondary | ICD-10-CM | POA: Insufficient documentation

## 2018-03-12 DIAGNOSIS — Z79899 Other long term (current) drug therapy: Secondary | ICD-10-CM | POA: Insufficient documentation

## 2018-03-12 DIAGNOSIS — Z87891 Personal history of nicotine dependence: Secondary | ICD-10-CM | POA: Insufficient documentation

## 2018-03-12 LAB — CBC
HCT: 42.3 % (ref 39.0–52.0)
Hemoglobin: 13.2 g/dL (ref 13.0–17.0)
MCH: 25.9 pg — ABNORMAL LOW (ref 26.0–34.0)
MCHC: 31.2 g/dL (ref 30.0–36.0)
MCV: 83.1 fL (ref 78.0–100.0)
Platelets: 284 10*3/uL (ref 150–400)
RBC: 5.09 MIL/uL (ref 4.22–5.81)
RDW: 13.3 % (ref 11.5–15.5)
WBC: 8.3 10*3/uL (ref 4.0–10.5)

## 2018-03-12 LAB — BASIC METABOLIC PANEL
Anion gap: 10 (ref 5–15)
BUN: 7 mg/dL (ref 6–20)
CO2: 24 mmol/L (ref 22–32)
Calcium: 8.9 mg/dL (ref 8.9–10.3)
Chloride: 103 mmol/L (ref 98–111)
Creatinine, Ser: 0.8 mg/dL (ref 0.61–1.24)
GFR calc Af Amer: >60 mL/min (ref >60)
GFR calc non Af Amer: >60 mL/min (ref >60)
Glucose, Bld: 100 mg/dL — ABNORMAL HIGH (ref 70–99)
Potassium: 3.3 mmol/L — ABNORMAL LOW (ref 3.5–5.1)
Sodium: 137 mmol/L (ref 135–145)

## 2018-03-12 LAB — RAPID URINE DRUG SCREEN, HOSP PERFORMED
Amphetamines: NOT DETECTED
Benzodiazepines: NOT DETECTED
Cocaine: NOT DETECTED
Opiates: NOT DETECTED
Tetrahydrocannabinol: NOT DETECTED

## 2018-03-12 LAB — CK: Total CK: 111 U/L (ref 49–397)

## 2018-03-12 LAB — I-STAT TROPONIN, ED: Troponin i, poc: 0 ng/mL (ref 0.00–0.08)

## 2018-03-12 LAB — ETHANOL: Alcohol, Ethyl (B): <10 mg/dL (ref <10)

## 2018-03-12 MED FILL — ATORVASTATIN 20 MG TABLET: 20 | 30 days supply | Qty: 30 | Fill #0

## 2018-03-12 NOTE — Discharge Instructions (Addendum)
Solicite ayuda de inmediato si: Siente un dolor de cabeza intenso o confusin. Siente debilidad inusual o adormecimiento. Siente que va a desmayarse. Siente un dolor intenso en el pecho o el abdomen. Vomita repetidas veces. Tiene dificultad para respirar.

## 2018-03-12 NOTE — ED Provider Notes (Signed)
[facility] Provider Note   CSN: [id number] Arrival date & time: 03/12/18  1523     History   Chief Complaint Chief Complaint  Patient presents with  . Shaking    HPI Johnnette LitterJose Manthei is a 52 y.o. male with a history of alcohol abuse, chronic anxiety, chronic dizziness complaints with over 32 visits in the last 6 months who presents today with chief complaint of dizziness, shaking and high blood pressure.  Patient states that he was working outside in the heat and he started feeling dizzy so he got in his [facility].  He states he then began to.  He felt some spasm in the back of his blood pressure which was noted at 160/110.  This made him extremely nervous so he came to the emergency department. He denies alcohol withdrawal.  He states he took all of his medications today.  He denies black or tea colored urine  HPI  Past Medical History:  Diagnosis Date  . Alcohol abuse   . Anxiety   . Anxiety   . Chronic neck pain   . Cocaine abuse (HCC)   . Hypertension   . Prediabetes     Patient Active Problem List   Diagnosis Date Noted  . 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 Prediabetes 04/23/2017  . Muscle spasm 04/23/2017  . Essential hypertension 04/16/2017  . H/O medication noncompliance 04/02/2017  . Vitamin D deficiency 04/02/2017  . DJD (degenerative joint disease) of cervical spine 03/16/2017  . 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 Dizziness 01/23/2017  . Anxiety 11/21/2016    Past Surgical History:  Procedure Laterality Date  . EYE SURGERY Right ~ 1980   "hit my eye"        Home Medications    Prior to Admission medications   Medication Sig Start Date End Date Taking? Authorizing Provider  acetaminophen (TYLENOL) 500 MG tablet Take 1,000 mg by mouth daily as needed (back pain).    [provider]  amLODipine (NORVASC) 10 MG tablet Take 1 tablet (10 mg total) by mouth daily. 02/22/18 05/23/18   Claiborne RiggFleming, Zelda W, NP  atorvastatin (LIPITOR) 20 MG tablet Take 1 tablet (20 mg total) by mouth daily. 02/26/18   Claiborne RiggFleming, Zelda W, NP  cyclobenzaprine (FLEXERIL) 10 MG tablet Take 1 tablet (10 mg total) by mouth 3 (three) times daily as needed for muscle spasms. 02/22/18   Claiborne RiggFleming, Zelda W, NP  fluticasone (FLONASE) 50 MCG/ACT nasal spray Place 1 spray into both nostrils daily. 02/22/18   Claiborne RiggFleming, Zelda W, NP  lisinopril (PRINIVIL,ZESTRIL) 5 MG tablet Take 1 tablet (5 mg total) by mouth daily. 02/22/18   Claiborne RiggFleming, Zelda W, NP  LORazepam (ATIVAN) 0.5 MG tablet Take 1 tablet (0.5 mg total) by mouth 2 (two) times daily. for anxiety 08/05/17   Elvina SidleLauenstein, Kurt, MD  meclizine (ANTIVERT) 25 MG tablet Take 1 tablet (25 mg total) by mouth 3 (three) times daily as needed for dizziness. 02/22/18   Claiborne RiggFleming, Zelda W, NP  promethazine (PHENERGAN) 25 MG tablet Take 1 tablet (25 mg total) by mouth every 8 (eight) hours as needed for nausea or vomiting. 02/22/18   Claiborne RiggFleming, Zelda W, NP    Family History Family History  Problem Relation Age of Onset  . Hypertension Mother   . Hypertension Father     Social History Social History   Tobacco Use  . Smoking status: Former Games developermoker  . Smokeless tobacco: Never  Used  Substance Use Topics  . Alcohol use: No    Comment: former  . Drug use: No    Comment: denies, reports past history      Allergies   Ibuprofen; Other; and Zofran [ondansetron hcl]   Review of Systems Review of Systems Ten systems reviewed and are negative for acute change, except as noted in the HPI.    Physical Exam Updated Vital Signs BP 119/86   Pulse 94   Temp 98.2 F (36.8 C) (Oral)   Resp 20   Ht 5\' 6"  (1.676 m)   Wt 88.9 kg (196 lb)   SpO2 98%   BMI 31.64 kg/m   Physical Exam Physical Exam  Nursing note and vitals reviewed. Constitutional: He appears well-developed and well-nourished. No distress.  HENT:  Head: Normocephalic and atraumatic.  Eyes: Conjunctivae normal are  normal. No scleral icterus.  Neck: Normal range of motion. Neck supple.  Cardiovascular: Normal rate, regular rhythm and normal heart sounds.   Pulmonary/Chest: Effort normal and breath sounds normal. No respiratory distress.  Abdominal: Soft. There is no tenderness.  Musculoskeletal: He exhibits no edema.  Neurological: He is alert.  Skin: Skin is warm and dry. He is not diaphoretic.  Psychiatric: His behavior is normal.     ED Treatments / Results  Labs (all labs ordered are listed, but only abnormal results are displayed) Labs Reviewed  BASIC METABOLIC PANEL - Abnormal; Notable for the following components:      Result Value   Potassium 3.3 (*)    Glucose, Bld 100 (*)    All other components within normal limits  CBC - Abnormal; Notable for the following components:   MCH 25.9 (*)    All other components within normal limits  CK  ETHANOL  RAPID URINE DRUG SCREEN, HOSP PERFORMED  I-STAT TROPONIN, ED    EKG None ED ECG REPORT   Date: 03/12/2018  Rate: 119  Rhythm: sinus tachycardia  QRS Axis: left  Intervals: normal  ST/T Wave abnormalities: normal and nonspecific T wave changes  Conduction Disutrbances:none  Narrative Interpretation:   Old EKG Reviewed: none available  I have personally reviewed the EKG tracing and agree with the computerized printout as noted.  Radiology No results found.  Procedures Procedures (including critical care time)  Medications Ordered in ED Medications - No data to display   Initial Impression / Assessment and Plan / ED Course  I have reviewed the triage vital signs and the nursing notes.  Pertinent labs & imaging results that were available during my care of the patient were reviewed by me and considered in my medical decision making (see chart for details).     Patient with asymptomatic HTN.  His vital signs are WNL and he has no sig lab abnormalities. He has no elevation in hs CK suggestive of Heat injury or  rhabdomyolysis. The patient appears appropriate for discharge at this time.   Final Clinical Impressions(s) / ED Diagnoses   Final diagnoses:  Elevated blood pressure reading    ED Discharge Orders    None       Arthor Captain, PA-C 03/13/18 1610    Margarita Grizzle, MD 03/13/18 1444

## 2018-03-12 NOTE — ED Notes (Signed)
Pt told we need urine sample

## 2018-03-12 NOTE — ED Provider Notes (Signed)
Patient placed in Quick Look pathway, seen and evaluated   Chief Complaint: dizziness  HPI:   Wesley [name] is a 52 y.o. male who presents to the ED with dizziness. Patient has had multiple visit to the ED and has a care plan. Pt reports that he was outside todayfor at least an hour, and come in, drank some water, but then started shaking. Pt states that he has taken his BP medication and his anxiety medication today and does not think that this is related to either. .   ROS: Neuro: dizziness  Physical Exam:  BP (!) 162/94 (BP Location: Left Arm)   Pulse (!) 124   Temp 99 F (37.2 C) (Oral)   Resp 18   Ht 5\' 6"  (1.676 m)   Wt 88.9 kg (196 lb)   SpO2 100%   BMI 31.64 kg/m    Gen: No distress  Neuro: Awake and Alert  Skin: Warm and dry  Heart: tachycardia      Initiation of care has begun. The patient has been counseled on the process, plan, and necessity for staying for the completion/evaluation, and the remainder of the medical screening examination    Janne Napoleoneese, Hope M, NP 03/12/18 1536    Gerhard MunchLockwood, Robert, MD 03/12/18 2355

## 2018-03-12 NOTE — ED Triage Notes (Signed)
Pt reports that he was outside today, and come in, drank some water, but then started shaking. Pt states that he has taken his BP medication and his anxiety medication today and does not think that this is related to either. Pt not shaking at this time, speaking in full sentences. NAD.

## 2018-03-13 ENCOUNTER — Ambulatory Visit (HOSPITAL_COMMUNITY)
Admission: EM | Admit: 2018-03-13 | Discharge: 2018-03-13 | Disposition: A | Discharge status: Home / Self Care | Payer: Self-pay | Attending: Internal Medicine | Admitting: Internal Medicine

## 2018-03-13 ENCOUNTER — Encounter (HOSPITAL_COMMUNITY): Payer: Self-pay | Admitting: Emergency Medicine

## 2018-03-13 DIAGNOSIS — R42 Dizziness and giddiness: Secondary | ICD-10-CM

## 2018-03-13 NOTE — Discharge Instructions (Addendum)
Full workup done in ED yesterday including EKG, BMP, CBC, CK, troponin, EtOH and rapid urine drug screen all which were unremarkable Avoid working in the heat for long periods of time Drink plenty of water, or half your body weight in ounces Exercise daily Eat a healthy well-balanced diet with lean meats, vegetables, and fruit Continue meclizine as needed for nausea Follow up with PCP in 2 weeks Return sooner or go to the ED if you have any new or worsening symptoms such as chest pain, SOB, fainting episodes, fever, chills, vomiting, abdominal pain, changes in bowel or bladder habits, etc...   El trabajo completo realizado en ED ayer incluy EKG, BMP, CBC, CK, troponina, EtOH y deteccin rpida de frmacos en orina, todos los cuales eran normales. Evite trabajar en el calor durante largos perodos de [place]. Beba mucha agua o la mitad de su peso corporal en onzas Ejercitarte diariamente Coma una dieta saludable y bien balanceada con carnes magras, verduras y frutas Continuar meclizine segn sea necesario para las nuseas. Seguimiento con PCP en 2 semanas. Regrese antes o vaya a la sala de emergencias si tiene sntomas nuevos o que empeoran, como dolor en el pecho, SOB, episodios de [place], [place], escalofros, vmitos, dolor abdominal, cambios en los hbitos del intestino o la vejiga, etc...Marland Kitchen

## 2018-03-13 NOTE — ED Provider Notes (Signed)
Wesley [name]   [id number] 03/13/18 Arrival Time: 0488  SUBJECTIVE:  Patient complains of dizziness that began 3 days ago.  Denies a precipitating event, trauma, or recent URI within the past month.  Was seen in the ED yesterday for same problem.  A full work-up was done at that time including EKG, BMP, CBC, CK, troponin, EtOH, and urine drug screen which were all unremarkable.  Describes the dizziness as if he is going to pass out. States dizziness is intermittent. Has NOT tried medications.    Symptoms made worse with leaning forward or being out in the heat.  Admits to previous symptoms.  Complains of associated nausea and ear pressure.  Denies fever, chills, vomiting, hearing changes, tinnitus, ear pain, chest pain, syncope, SOB, weakness, slurred speech, memory or emotional changes, facial drooping/ asymmetry, incoordination, numbness or tingling, abdominal pain, changes in bowel or bladder habits.    ROS: As per HPI. Past Medical History:  Diagnosis Date  . Alcohol abuse   . Anxiety   . Anxiety   . Chronic neck pain   . Cocaine abuse ([place])   . Hypertension   . Prediabetes    Past Surgical History:  Procedure Laterality Date  . EYE SURGERY Right ~ 1980   "hit my eye"   Allergies  Allergen Reactions  . Ibuprofen Other (See Comments) and Rash    Makes his throat get "very dry" after taking it States he cant take it  . Other Other (See Comments)    Steroids: Patient received a steroid shot from [facility] [phone] and it resulted in his muscles twitching all over his body  . Zofran [Ondansetron Hcl] Other (See Comments)    Causes sweating and body "feels badly"   No current facility-administered medications on file prior to encounter.    Current Outpatient Medications on File Prior to Encounter  Medication Sig Dispense Refill  . acetaminophen (TYLENOL) 500 MG tablet Take 1,000 mg by mouth daily as needed (back pain).    Marland Kitchen amLODipine (NORVASC) 10 MG  tablet Take 1 tablet (10 mg total) by mouth daily. 90 tablet 1  . atorvastatin (LIPITOR) 20 MG tablet Take 1 tablet (20 mg total) by mouth daily. 90 tablet 3  . cyclobenzaprine (FLEXERIL) 10 MG tablet Take 1 tablet (10 mg total) by mouth 3 (three) times daily as needed for muscle spasms. 60 tablet 0  . fluticasone (FLONASE) 50 MCG/ACT nasal spray Place 1 spray into both nostrils daily. 16 g 2  . lisinopril (PRINIVIL,ZESTRIL) 5 MG tablet Take 1 tablet (5 mg total) by mouth daily. 90 tablet 3  . LORazepam (ATIVAN) 0.5 MG tablet Take 1 tablet (0.5 mg total) by mouth 2 (two) times daily. for anxiety 60 tablet 1  . meclizine (ANTIVERT) 25 MG tablet Take 1 tablet (25 mg total) by mouth 3 (three) times daily as needed for dizziness. 30 tablet 0  . promethazine (PHENERGAN) 25 MG tablet Take 1 tablet (25 mg total) by mouth every 8 (eight) hours as needed for nausea or vomiting. 20 tablet 0   Social History   Socioeconomic History  . Marital status: Significant Other    Spouse name: Not on file  . Number of children: Not on file  . Years of education: Not on file  . Highest education level: Not on file  Occupational History  . Not on file  Social Needs  . Financial resource strain: Not on file  . Food insecurity:  Worry: Not on file    Inability: Not on file  . Transportation needs:    Medical: Not on file    Non-medical: Not on file  Tobacco Use  . Smoking status: Former Research scientist (life sciences)  . Smokeless tobacco: Never Used  Substance and Sexual Activity  . Alcohol use: No    Comment: former  . Drug use: No    Comment: denies, reports past history   . Sexual activity: Not Currently  Lifestyle  . Physical activity:    Days per week: Not on file    Minutes per session: Not on file  . Stress: Not on file  Relationships  . Social connections:    Talks on phone: Not on file    Gets together: Not on file    Attends religious service: Not on file    Active member of club or organization: Not on  file    Attends meetings of clubs or organizations: Not on file    Relationship status: Not on file  . Intimate partner violence:    Fear of current or ex partner: Not on file    Emotionally abused: Not on file    Physically abused: Not on file    Forced sexual activity: Not on file  Other Topics Concern  . Not on file  Social History Narrative  . Not on file   Family History  Problem Relation Age of Onset  . Hypertension Mother   . Hypertension Father     OBJECTIVE:  Vitals:   03/13/18 1613  BP: 116/82  Pulse: 91  Resp: 18  Temp: 98.8 F (37.1 C)  TempSrc: Temporal  SpO2: 98%    General appearance: alert; no distress Eyes: PERRLA; EOMI; conjunctiva normal HENT: normocephalic; atraumatic; TMs normal; nasal mucosa normal; oral mucosa normal Neck: supple with FROM Lungs: clear to auscultation bilaterally Heart: regular rate and rhythm Extremities: no cyanosis or edema; symmetrical with no gross deformities Skin: warm and dry Neurologic: normal gait; strength and sensation intact about the upper and lower extremities; CN 2-12 grossly intact; finger to nose without difficulty; negative pronator drift Psychological: alert and cooperative; anxious affect  Labs:  Results for orders placed or performed during the hospital encounter of 03/12/18 (from the past 48 hour(s))  Basic metabolic panel     Status: Abnormal   Collection Time: 03/12/18  3:37 PM  Result Value Ref Range   Sodium 137 135 - 145 mmol/L   Potassium 3.3 (L) 3.5 - 5.1 mmol/L   Chloride 103 98 - 111 mmol/L    Comment: Please note change in reference range.   CO2 24 22 - 32 mmol/L   Glucose, Bld 100 (H) 70 - 99 mg/dL    Comment: Please note change in reference range.   BUN 7 6 - 20 mg/dL    Comment: Please note change in reference range.   Creatinine, Ser 0.80 0.61 - 1.24 mg/dL   Calcium 8.9 8.9 - 10.3 mg/dL   GFR calc non Af Amer >60 >60 mL/min   GFR calc Af Amer >60 >60 mL/min    Comment: (NOTE) The  eGFR has been calculated using the CKD EPI equation. This calculation has not been validated in all clinical situations. eGFR's persistently <60 mL/min signify possible Chronic Kidney Disease.    Anion gap 10 5 - 15    Comment: Performed at [facility] [address], [place], [place] [address]  CBC     Status: Abnormal   Collection  Time: 03/12/18  3:37 PM  Result Value Ref Range   WBC 8.3 4.0 - 10.5 K/uL   RBC 5.09 4.22 - 5.81 MIL/uL   Hemoglobin 13.2 13.0 - 17.0 g/dL   HCT 42.3 39.0 - 52.0 %   MCV 83.1 78.0 - 100.0 fL   MCH 25.9 (L) 26.0 - 34.0 pg   MCHC 31.2 30.0 - 36.0 g/dL   RDW 13.3 11.5 - 15.5 %   Platelets 284 150 - 400 K/uL    Comment: Performed at [facility] [address] [place], [place] [address]  CK     Status: None   Collection Time: 03/12/18  3:37 PM  Result Value Ref Range   Total CK 111 49 - 397 U/L    Comment: Performed at [facility], [place] [address], [place], [place] [address]  I-stat troponin, ED     Status: None   Collection Time: 03/12/18  3:59 PM  Result Value Ref Range   Troponin i, poc 0.00 0.00 - 0.08 ng/mL   Comment 3            Comment: Due to the release kinetics of cTnI, a negative result within the first hours of the onset of symptoms does not rule out myocardial infarction with certainty. If myocardial infarction is still suspected, repeat the test at appropriate intervals.   Ethanol     Status: None   Collection Time: 03/12/18  5:23 PM  Result Value Ref Range   Alcohol, Ethyl (B) <10 <10 mg/dL    Comment: (NOTE) Lowest detectable limit for serum alcohol is 10 mg/dL. For medical purposes only. Performed at [facility], [place] [address] [place], [place] [address]   Rapid urine drug screen (hospital performed)     Status: Abnormal   Collection Time: 03/12/18  5:23 PM  Result Value Ref Range   Opiates NONE DETECTED NONE DETECTED   Cocaine NONE DETECTED NONE DETECTED   Benzodiazepines NONE DETECTED NONE  DETECTED   Amphetamines NONE DETECTED NONE DETECTED   Tetrahydrocannabinol NONE DETECTED NONE DETECTED   Barbiturates (A) NONE DETECTED    Result not available. Reagent lot number recalled by manufacturer.    Comment: Performed at [facility], [place] [address] [place], [place] [address]    No results found.  ASSESSMENT & PLAN:  1. Dizziness     No orders of the defined types were placed in this encounter.  Full workup done in ED yesterday including EKG, BMP, CBC, CK, troponin, EtOH and rapid urine drug screen all which were unremarkable Avoid working in the heat for long periods of time Drink plenty of water, or half your body weight in ounces Exercise daily Eat a healthy well-balanced diet with lean meats, vegetables, and fruit Prescribed zofran as needed for nausea Follow up with PCP in 2 weeks Return sooner or go to the ED if you have any new or worsening symptoms such as chest pain, SOB, fainting episodes, fever, chills, vomiting, abdominal pain, changes in bowel or bladder habits.    Reviewed expectations re: course of current medical issues. Questions answered. Outlined signs and symptoms indicating need for more acute intervention. Patient verbalized understanding. After Visit Summary given.    Lestine Box, PA-C 03/13/18 1724

## 2018-03-13 NOTE — ED Triage Notes (Signed)
Pt here for dizziness; pt seen multiple times in past for same

## 2018-03-17 ENCOUNTER — Other Ambulatory Visit: Payer: Self-pay

## 2018-03-17 ENCOUNTER — Emergency Department (HOSPITAL_COMMUNITY)
Admission: EM | Admit: 2018-03-17 | Discharge: 2018-03-17 | Disposition: A | Discharge status: Home / Self Care | Payer: Self-pay | Attending: Emergency Medicine | Admitting: Emergency Medicine

## 2018-03-17 ENCOUNTER — Encounter (HOSPITAL_COMMUNITY): Payer: Self-pay | Admitting: Emergency Medicine

## 2018-03-17 DIAGNOSIS — I10 Essential (primary) hypertension: Secondary | ICD-10-CM | POA: Insufficient documentation

## 2018-03-17 DIAGNOSIS — Z79899 Other long term (current) drug therapy: Secondary | ICD-10-CM | POA: Insufficient documentation

## 2018-03-17 LAB — BASIC METABOLIC PANEL
Anion gap: 9 (ref 5–15)
BUN: 11 mg/dL (ref 6–20)
CO2: 23 mmol/L (ref 22–32)
Calcium: 8.9 mg/dL (ref 8.9–10.3)
Chloride: 105 mmol/L (ref 98–111)
Creatinine, Ser: 0.7 mg/dL (ref 0.61–1.24)
GFR calc Af Amer: >60 mL/min (ref >60)
GFR calc non Af Amer: >60 mL/min (ref >60)
Glucose, Bld: 129 mg/dL — ABNORMAL HIGH (ref 70–99)
Potassium: 3.2 mmol/L — ABNORMAL LOW (ref 3.5–5.1)
Sodium: 137 mmol/L (ref 135–145)

## 2018-03-17 LAB — CBC WITH DIFFERENTIAL/PLATELET
Abs Immature Granulocytes: 0 10*3/uL (ref 0.0–0.1)
Basophils Absolute: 0 10*3/uL (ref 0.0–0.1)
Basophils Relative: 0 %
Eosinophils Absolute: 0.2 10*3/uL (ref 0.0–0.7)
Eosinophils Relative: 3 %
HCT: 41.9 % (ref 39.0–52.0)
Hemoglobin: 13.3 g/dL (ref 13.0–17.0)
Immature Granulocytes: 0 %
Lymphocytes Relative: 32 %
Lymphs Abs: 1.8 10*3/uL (ref 0.7–4.0)
MCH: 26.3 pg (ref 26.0–34.0)
MCHC: 31.7 g/dL (ref 30.0–36.0)
MCV: 82.8 fL (ref 78.0–100.0)
Monocytes Absolute: 0.5 10*3/uL (ref 0.1–1.0)
Monocytes Relative: 9 %
Neutro Abs: 3.1 10*3/uL (ref 1.7–7.7)
Neutrophils Relative %: 56 %
Platelets: 269 10*3/uL (ref 150–400)
RBC: 5.06 MIL/uL (ref 4.22–5.81)
RDW: 13.5 % (ref 11.5–15.5)
WBC: 5.6 10*3/uL (ref 4.0–10.5)

## 2018-03-17 NOTE — ED Notes (Signed)
Pt verbalized understanding discharge instructions and denies any further needs or questions at this time. VS stable, ambulatory and steady gait.   

## 2018-03-17 NOTE — Discharge Instructions (Signed)
Do not drink alcohol while you are taking any of your prescribed medications.

## 2018-03-17 NOTE — ED Notes (Signed)
Pt admits to drinking ETOH today. Pt hasnt taken BP meds in several days.  Pt acting erratically, wont sit still.  Pt asked to take BP meds now but ED-P advised not to with ETOH.

## 2018-03-17 NOTE — ED Notes (Signed)
RN found blood tubes in tube station.  Now sent down to lab.

## 2018-03-17 NOTE — ED Provider Notes (Signed)
[facility] Provider Note   CSN: [id number] Arrival date & time: 03/17/18  1348     History   Chief Complaint Chief Complaint  Patient presents with  . Hypertension    HPI Wesley [name] is a 52 y.o. male with a past medical history of alcohol abuse, hypertension, substance abuse, who presents to ED for evaluation of hypertension.  States that he was at a cookout for his mother's birthday prior to arrival.  He reports drinking half a cup of some type of alcohol there.  He was unsure if he was able to take his amlodipine while being intoxicated.  He did not take his amlodipine yesterday either because he forgot.  He is here because he had a high blood pressure reading.  He reports generalized body aches but denies any chest pain, shortness of breath, vision changes, injuries or falls.  He wants to know whether he can take his amlodipine while drinking alcohol.  HPI  Past Medical History:  Diagnosis Date  . Alcohol abuse   . Anxiety   . Anxiety   . Chronic neck pain   . Cocaine abuse (HCC)   . Hypertension   . Prediabetes     Patient Active Problem List   Diagnosis Date Noted  . 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 Prediabetes 04/23/2017  . Muscle spasm 04/23/2017  . Essential hypertension 04/16/2017  . H/O medication noncompliance 04/02/2017  . Vitamin D deficiency 04/02/2017  . DJD (degenerative joint disease) of cervical spine 03/16/2017  . 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 Dizziness 01/23/2017  . Anxiety 11/21/2016    Past Surgical History:  Procedure Laterality Date  . EYE SURGERY Right ~ 1980   "hit my eye"        Home Medications    Prior to Admission medications   Medication Sig Start Date End Date Taking? Authorizing Provider  acetaminophen (TYLENOL) 500 MG tablet Take 1,000 mg by mouth daily as needed (back pain).    [provider]  amLODipine (NORVASC) 10 MG tablet Take 1 tablet (10 mg  total) by mouth daily. 02/22/18 05/23/18  Claiborne RiggFleming, Zelda W, NP  atorvastatin (LIPITOR) 20 MG tablet Take 1 tablet (20 mg total) by mouth daily. 02/26/18   Claiborne RiggFleming, Zelda W, NP  cyclobenzaprine (FLEXERIL) 10 MG tablet Take 1 tablet (10 mg total) by mouth 3 (three) times daily as needed for muscle spasms. 02/22/18   Claiborne RiggFleming, Zelda W, NP  fluticasone (FLONASE) 50 MCG/ACT nasal spray Place 1 spray into both nostrils daily. 02/22/18   Claiborne RiggFleming, Zelda W, NP  lisinopril (PRINIVIL,ZESTRIL) 5 MG tablet Take 1 tablet (5 mg total) by mouth daily. 02/22/18   Claiborne RiggFleming, Zelda W, NP  LORazepam (ATIVAN) 0.5 MG tablet Take 1 tablet (0.5 mg total) by mouth 2 (two) times daily. for anxiety 08/05/17   Elvina SidleLauenstein, Kurt, MD  meclizine (ANTIVERT) 25 MG tablet Take 1 tablet (25 mg total) by mouth 3 (three) times daily as needed for dizziness. 02/22/18   Claiborne RiggFleming, Zelda W, NP  promethazine (PHENERGAN) 25 MG tablet Take 1 tablet (25 mg total) by mouth every 8 (eight) hours as needed for nausea or vomiting. 02/22/18   Claiborne RiggFleming, Zelda W, NP    Family History Family History  Problem Relation Age of Onset  . Hypertension Mother   . Hypertension Father     Social History Social History   Tobacco Use  . Smoking status: Former Games developermoker  . Smokeless tobacco:  Never Used  Substance Use Topics  . Alcohol use: No    Comment: former  . Drug use: No    Comment: denies, reports past history      Allergies   Ibuprofen; Other; and Zofran [ondansetron hcl]   Review of Systems Review of Systems  Constitutional: Negative for chills and fever.  Respiratory: Negative for shortness of breath.   Cardiovascular: Negative for chest pain.  Gastrointestinal: Negative for nausea and vomiting.  Musculoskeletal: Positive for myalgias.  Skin: Negative for wound.  Neurological: Positive for headaches. Negative for seizures and numbness.     Physical Exam Updated Vital Signs BP (!) 133/91   Pulse 92   Temp 98.2 F (36.8 C)   Resp 16    SpO2 100%   Physical Exam  Constitutional: He appears well-developed and well-nourished. No distress.  HENT:  Head: Normocephalic and atraumatic.  Eyes: Conjunctivae and EOM are normal. No scleral icterus.  Neck: Normal range of motion.  Cardiovascular: Normal rate, regular rhythm and normal heart sounds.  Pulmonary/Chest: Effort normal and breath sounds normal. No respiratory distress.  Neurological: He is alert. No cranial nerve deficit or sensory deficit. He exhibits normal muscle tone. Coordination normal.  PERRL.  No facial asymmetry noted.  Equal grip strength bilaterally.  Skin: No rash noted. He is not diaphoretic.  Psychiatric: He has a normal mood and affect.  Nursing note and vitals reviewed.    ED Treatments / Results  Labs (all labs ordered are listed, but only abnormal results are displayed) Labs Reviewed  BASIC METABOLIC PANEL - Abnormal; Notable for the following components:      Result Value   Potassium 3.2 (*)    Glucose, Bld 129 (*)    All other components within normal limits  CBC WITH DIFFERENTIAL/PLATELET    EKG None  Radiology No results found.  Procedures Procedures (including critical care time)  Medications Ordered in ED Medications - No data to display   Initial Impression / Assessment and Plan / ED Course  I have reviewed the triage vital signs and the nursing notes.  Pertinent labs & imaging results that were available during my care of the patient were reviewed by me and considered in my medical decision making (see chart for details).     52 year old male with past medical history of hypertension, alcohol abuse presents for generalized body aches and medication question he has not taken his amlodipine for 2 days.  He drank alcohol today and is unsure of amlodipine with it.  Denies any chest pain or shortness of breath.  CBC, BMP is unremarkable.  Blood pressure has improved here.  I told patient that essentially should not take any  medicine with alcohol.  Will advise him to take it every morning as prescribed. Advised to follow-up with evaluation.  Vital signs are stable.  He has no deficits on neurological noted.  Advised to return to ED for any severe worsening symptoms.  Portions of this note were generated with Scientist, clinical (histocompatibility and immunogenetics). Dictation errors may occur despite best attempts at proofreading.  Final Clinical Impressions(s) / ED Diagnoses   Final diagnoses:  Essential hypertension    ED Discharge Orders    None       Dietrich Pates, PA-C 03/17/18 1658    Gerhard Munch, MD 03/18/18 2019

## 2018-03-17 NOTE — ED Triage Notes (Signed)
Patient to ED c/o hypertension. He states earlier it was high (140/100). History of HTN. BP in triage 136/93. He also endorses headache. Neuro intact. In no apparent distress.

## 2018-03-20 ENCOUNTER — Ambulatory Visit: Payer: Self-pay | Admitting: Nurse Practitioner

## 2018-03-22 ENCOUNTER — Ambulatory Visit: Payer: Self-pay | Attending: Nurse Practitioner | Admitting: Family Medicine

## 2018-03-22 VITALS — BP 122/73 | HR 91 | Temp 98.2°F | Resp 18 | Ht 67.0 in | Wt 202.0 lb

## 2018-03-22 DIAGNOSIS — F1010 Alcohol abuse, uncomplicated: Secondary | ICD-10-CM | POA: Insufficient documentation

## 2018-03-22 DIAGNOSIS — E559 Vitamin D deficiency, unspecified: Secondary | ICD-10-CM | POA: Insufficient documentation

## 2018-03-22 DIAGNOSIS — M5030 Other cervical disc degeneration, unspecified cervical region: Secondary | ICD-10-CM | POA: Insufficient documentation

## 2018-03-22 DIAGNOSIS — H548 Legal blindness, as defined in USA: Secondary | ICD-10-CM | POA: Insufficient documentation

## 2018-03-22 DIAGNOSIS — Z79899 Other long term (current) drug therapy: Secondary | ICD-10-CM | POA: Insufficient documentation

## 2018-03-22 DIAGNOSIS — Z87891 Personal history of nicotine dependence: Secondary | ICD-10-CM | POA: Insufficient documentation

## 2018-03-22 DIAGNOSIS — I10 Essential (primary) hypertension: Secondary | ICD-10-CM | POA: Insufficient documentation

## 2018-03-22 DIAGNOSIS — F419 Anxiety disorder, unspecified: Secondary | ICD-10-CM | POA: Insufficient documentation

## 2018-03-22 DIAGNOSIS — R42 Dizziness and giddiness: Secondary | ICD-10-CM | POA: Insufficient documentation

## 2018-03-22 DIAGNOSIS — R7303 Prediabetes: Secondary | ICD-10-CM | POA: Insufficient documentation

## 2018-03-22 MED ORDER — METFORMIN HCL 500 MG PO TABS: 500.0000 mg | ORAL_TABLET | Freq: Every day | ORAL | 3 refills | Status: DC

## 2018-03-22 NOTE — Patient Instructions (Signed)
I recommend taking amlodipine at dinner to reduce dizziness episodes. Continue Meclizine as needed. I am resuming metformin 5 mg at dinner time.     Mareos (Dizziness) Los mareos son un problema muy frecuente. Causan sensacin de inestabilidad o de desvanecimiento. Puede sentir que se va a desmayar. Un mareo puede provocarle una lesin si se tropieza o se cae. Cualquier persona puede marearse, pero los Polebridgemareos son ms frecuentes en los ONEOKadultos mayores. Esta afeccin puede tener muchas causas, por ejemplo:  Medicamentos.  Deshidratacin.  Enfermedad. CUIDADOS EN EL HOGAR Estas indicaciones pueden ayudarlo con el trastorno: Comida y bebida  Beba suficiente lquido para Pharmacologistmantener el pis (orina) claro o de color amarillo plido. Esto evita la deshidratacin. Trate de beber ms lquidos transparentes, como agua.  No beba alcohol.  Limite la cantidad de cafena que bebe o come si el mdico se lo indic.  Limite la cantidad de sal que bebe o come si el mdico se lo indic. Actividad  Evite los movimientos rpidos. ? Cuando se levante de una silla, sujtese hasta sentirse bien. ? Por la maana, sintese primero a un lado de la cama. Cuando se sienta bien, pngase lentamente de [address] Avepie mientras se sostiene de algo, hasta que sepa que ha logrado el equilibrio.  Mueva las piernas con frecuencia si debe estar de pie en un lugar durante mucho tiempo. Mientras est de pie, contraiga y relaje los msculos de las piernas.  No conduzca vehculos ni utilice maquinarias pesadas si se siente mareado.  Evite agacharse si se siente mareado. En su casa, coloque los objetos de modo que le resulte fcil alcanzarlos sin Public librarianagacharse. Estilo de vida  No consuma ningn producto que contenga tabaco, lo que incluye cigarrillos, tabaco de Theatre managermascar o Administrator, Civil Servicecigarrillos electrnicos. Si necesita ayuda para dejar de fumar, consulte al American Expressmdico.  Trate de reducir el nivel de estrs practicando actividades como el yoga o la  meditacin. Hable con el mdico si necesita ayuda. Instrucciones generales  Controle sus mareos para ver si hay cambios.  Tome los medicamentos solamente como se lo haya indicado el mdico. Hable con el mdico si cree que algn medicamento que est tomando es la causa de sus [place] Acresmareos.  Infrmele a un amigo o a un familiar si se siente mareado. Pdale a esta persona que llame al mdico si observa cambios en su comportamiento.  Concurra a todas las visitas de control como se lo haya indicado el mdico. Esto es importante. SOLICITE AYUDA SI:  Los American Expressmareos persisten.  Los Golden West Financialmareos o la sensacin de Production assistant, radiodesvanecimiento empeoran.  Siente malestar estomacal (nuseas).  Tiene problemas para escuchar.  Aparecen nuevos sntomas.  Cuando est de pie se siente inestable o que la habitacin da vueltas.  SOLICITE AYUDA DE INMEDIATO SI:  Vomita o tiene diarrea y no puede comer ni beber nada.  Tiene dificultad para lo siguiente: ? Hablar. ? Caminar. ? Tragar. ? Usar los brazos, las manos o las piernas.  Siente una debilidad generalizada.  No piensa con claridad o tiene dificultades para armar oraciones. Es posible que un amigo o un familiar adviertan que esto ocurre.  Tiene los siguientes sntomas: ? Journalist, newspaperDolor en el pecho. ? Dolor en el vientre (abdomen). ? Falta de aire. ? Sudoracin.  Cambios en la visin.  Hemorragias.  Dolores de Turkmenistancabeza.  Dolor o rigidez en el cuello.  Grant RutsFiebre.  Esta informacin no tiene Theme park managercomo fin reemplazar el consejo del mdico. Asegrese de hacerle al mdico cualquier pregunta que tenga. Document Released: 08/17/2011 Document Revised:  01/12/2015 Document Reviewed: 08/24/2014 Elsevier Interactive Patient Education  2017 [employer]

## 2018-03-22 NOTE — Progress Notes (Addendum)
Patient ID: Wesley [name], male    DOB: 04-16-1966, 52 y.o.   MRN: [id number]  PCP: Claiborne Rigg, NP  Chief Complaint  Patient presents with  . Hypertension    Subjective:  HPI Wesley [name] is a 52 y.o. male presents for evaluation of dizziness and elevated blood pressure. Medical problems significant for hypertension, prediabetes, alcohol/substance abuse, and anxiety. Patient has been seen in the ED 3 times this month with complaints of elevated blood pressure and dizziness. He is not checking his blood pressure at home. He is here today complaining of dizziness while bending over and is concern that either his blood pressure is elevated or his blood sugar is high. He suffers from prediabetes. He reports drinking water daily on average 10 bottles per day. Currently prescribe meclizine which he feels is not resolving his dizziness. He admits that he is anxious as he is almost out of his Ativan which is prescribed by his mental health provider in [place]. He denies any recent falls or head injures related to dizziness. He admits to working long hours outdoors and takes his amlodipine often in the middle of the afternoon. Social History   Socioeconomic History  . Marital status: Significant Other    Spouse name: Not on file  . Number of children: Not on file  . Years of education: Not on file  . Highest education level: Not on file  Occupational History  . Not on file  Social Needs  . Financial resource strain: Not on file  . Food insecurity:    Worry: Not on file    Inability: Not on file  . Transportation needs:    Medical: Not on file    Non-medical: Not on file  Tobacco Use  . Smoking status: Former Games developer  . Smokeless tobacco: Never Used  Substance and Sexual Activity  . Alcohol use: No    Comment: former  . Drug use: No    Comment: denies, reports past history   . Sexual activity: Not Currently  Lifestyle  . Physical activity:    Days per week: Not on file   Minutes per session: Not on file  . Stress: Not on file  Relationships  . Social connections:    Talks on phone: Not on file    Gets together: Not on file    Attends religious service: Not on file    Active member of club or organization: Not on file    Attends meetings of clubs or organizations: Not on file    Relationship status: Not on file  . Intimate partner violence:    Fear of current or ex partner: Not on file    Emotionally abused: Not on file    Physically abused: Not on file    Forced sexual activity: Not on file  Other Topics Concern  . Not on file  Social History Narrative  . Not on file    Family History  Problem Relation Age of Onset  . Hypertension Mother   . Hypertension Father    Review of Systems Pertinent negatives listed in HPI  Patient Active Problem List   Diagnosis Date Noted  . Legally blind in right eye, as defined in [place] 05/02/2017  . Prediabetes 04/23/2017  . Muscle spasm 04/23/2017  . Essential hypertension 04/16/2017  . H/O medication noncompliance 04/02/2017  . Vitamin D deficiency 04/02/2017  . DJD (degenerative joint disease) of cervical spine 03/16/2017  . 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 Dizziness 01/23/2017  . Anxiety  11/21/2016    Allergies  Allergen Reactions  . Ibuprofen Other (See Comments) and Rash    Makes his throat get "very dry" after taking it States he cant take it  . Other Other (See Comments)    Steroids: Patient [facility] (336) [phone] and it resulted in his muscles twitching all over his body  . Zofran [Ondansetron Hcl] Other (See Comments)    Causes sweating and body "feels badly"    Prior to Admission medications   Medication Sig Start Date End Date Taking? Authorizing Provider  acetaminophen (TYLENOL) 500 MG tablet Take 1,000 mg by mouth daily as needed (back pain).    [provider]  amLODipine (NORVASC) 10 MG tablet Take 1 tablet (10 mg total) by mouth daily.  02/22/18 05/23/18  Claiborne RiggFleming, Zelda W, NP  atorvastatin (LIPITOR) 20 MG tablet Take 1 tablet (20 mg total) by mouth daily. 02/26/18   Claiborne RiggFleming, Zelda W, NP  cyclobenzaprine (FLEXERIL) 10 MG tablet Take 1 tablet (10 mg total) by mouth 3 (three) times daily as needed for muscle spasms. 02/22/18   Claiborne RiggFleming, Zelda W, NP  fluticasone (FLONASE) 50 MCG/ACT nasal spray Place 1 spray into both nostrils daily. 02/22/18   Claiborne RiggFleming, Zelda W, NP  lisinopril (PRINIVIL,ZESTRIL) 5 MG tablet Take 1 tablet (5 mg total) by mouth daily. 02/22/18   Claiborne RiggFleming, Zelda W, NP  LORazepam (ATIVAN) 0.5 MG tablet Take 1 tablet (0.5 mg total) by mouth 2 (two) times daily. for anxiety 08/05/17   Elvina SidleLauenstein, Kurt, MD  meclizine (ANTIVERT) 25 MG tablet Take 1 tablet (25 mg total) by mouth 3 (three) times daily as needed for dizziness. 02/22/18   Claiborne RiggFleming, Zelda W, NP  promethazine (PHENERGAN) 25 MG tablet Take 1 tablet (25 mg total) by mouth every 8 (eight) hours as needed for nausea or vomiting. 02/22/18   Claiborne RiggFleming, Zelda W, NP    Past Medical, Surgical Family and Social History reviewed and updated.    Objective:   Today's Vitals   03/22/18 1126 03/22/18 1127  BP: 122/73   Pulse: 91   Resp: 18   Temp: 98.2 F (36.8 C)   TempSrc: Oral   SpO2: 100%   Weight: 202 lb (91.6 kg)   Height: 5\' 7"  (1.702 m)   PainSc: 0-No pain 4     Wt Readings from Last 3 Encounters:  03/22/18 202 lb (91.6 kg)  03/12/18 196 lb (88.9 kg)  03/07/18 196 lb (88.9 kg)   Physical Exam  Constitutional: He is oriented to person, place, and time. He appears well-developed and well-nourished.  HENT:  Head: Normocephalic and atraumatic.  Neck: Normal range of motion. Neck supple.  Cardiovascular: Normal rate, regular rhythm, normal heart sounds and intact distal pulses.  Pulmonary/Chest: Effort normal and breath sounds normal.  Musculoskeletal: Normal range of motion.  Neurological: He is alert and oriented to person, place, and time. No sensory deficit.  Coordination normal.  Skin: Skin is warm and dry.  Psychiatric: He has a normal mood and affect. His behavior is normal. Judgment and thought content normal.     Assessment & Plan:  1. Prediabetes,glucose is elevated today however patient just completed a full meal prior to arriving at clinic. Continue to monitor with PCP. Recommended engaging in routine physical activity and reducing intake of carbohydrates.  2. Anxiety -follow-up with mental health provider for all refilled of benzodiazepines. He has an upcoming appointment but feared he would run out of medication prior  to next follow-up. Advised patient to call mental health provider to express concerns and request an earlier appointment.  3. Dizzinesses -continue meclizine. -recommended taking Amlodipine in evening with dinner as medication can cause dizziness.  4. Essential hypertension, controlled today. Recommended taking amlodipine in afternoon wih dinner to reduce the side effect of dizziness.   A total of 30  minutes spent, greater than 50 % of this time was spent due to language barrier in addition to additional time spent reviewing prior medical history, reviewing medications and indications of treatment, prior labs and diagnostic tests, discussing current plan of treatment, health promotion, and goals of treatment.    RTC: 6 months for chronic condition follow-up   Godfrey Pick. Tiburcio Pea, MSN, [facility]  [address] [place], [place], [place] [address] [phone]

## 2018-03-24 ENCOUNTER — Encounter (HOSPITAL_COMMUNITY): Payer: Self-pay | Admitting: Emergency Medicine

## 2018-03-24 ENCOUNTER — Other Ambulatory Visit: Payer: Self-pay

## 2018-03-24 ENCOUNTER — Emergency Department (HOSPITAL_COMMUNITY)
Admission: EM | Admit: 2018-03-24 | Discharge: 2018-03-25 | Disposition: A | Discharge status: Home / Self Care | Payer: Self-pay | Attending: Emergency Medicine | Admitting: Emergency Medicine

## 2018-03-24 DIAGNOSIS — R7303 Prediabetes: Secondary | ICD-10-CM | POA: Insufficient documentation

## 2018-03-24 DIAGNOSIS — Z87891 Personal history of nicotine dependence: Secondary | ICD-10-CM | POA: Insufficient documentation

## 2018-03-24 DIAGNOSIS — R42 Dizziness and giddiness: Secondary | ICD-10-CM | POA: Insufficient documentation

## 2018-03-24 DIAGNOSIS — I10 Essential (primary) hypertension: Secondary | ICD-10-CM | POA: Insufficient documentation

## 2018-03-24 DIAGNOSIS — Z79899 Other long term (current) drug therapy: Secondary | ICD-10-CM | POA: Insufficient documentation

## 2018-03-24 DIAGNOSIS — Z7984 Long term (current) use of oral hypoglycemic drugs: Secondary | ICD-10-CM | POA: Insufficient documentation

## 2018-03-24 LAB — CBC
HCT: 44.2 % (ref 39.0–52.0)
Hemoglobin: 14.1 g/dL (ref 13.0–17.0)
MCH: 26.3 pg (ref 26.0–34.0)
MCHC: 31.9 g/dL (ref 30.0–36.0)
MCV: 82.5 fL (ref 78.0–100.0)
Platelets: 286 10*3/uL (ref 150–400)
RBC: 5.36 MIL/uL (ref 4.22–5.81)
RDW: 13.2 % (ref 11.5–15.5)
WBC: 9.5 10*3/uL (ref 4.0–10.5)

## 2018-03-24 LAB — BASIC METABOLIC PANEL
Anion gap: 11 (ref 5–15)
BUN: 9 mg/dL (ref 6–20)
CO2: 24 mmol/L (ref 22–32)
Calcium: 9.2 mg/dL (ref 8.9–10.3)
Chloride: 102 mmol/L (ref 98–111)
Creatinine, Ser: 0.78 mg/dL (ref 0.61–1.24)
GFR calc Af Amer: >60 mL/min (ref >60)
GFR calc non Af Amer: >60 mL/min (ref >60)
Glucose, Bld: 96 mg/dL (ref 70–99)
Potassium: 3.5 mmol/L (ref 3.5–5.1)
Sodium: 137 mmol/L (ref 135–145)

## 2018-03-24 LAB — URINALYSIS, ROUTINE W REFLEX MICROSCOPIC
Bilirubin Urine: NEGATIVE
Glucose, UA: NEGATIVE mg/dL
Hgb urine dipstick: NEGATIVE
Ketones, ur: NEGATIVE mg/dL
Leukocytes, UA: NEGATIVE
Nitrite: NEGATIVE
Protein, ur: NEGATIVE mg/dL
Specific Gravity, Urine: 1.006 (ref 1.005–1.030)
pH: 7 (ref 5.0–8.0)

## 2018-03-24 LAB — CBG MONITORING, ED
Glucose-Capillary: 128 mg/dL — ABNORMAL HIGH (ref 70–99)
Glucose-Capillary: 96 mg/dL (ref 70–99)

## 2018-03-24 NOTE — ED Provider Notes (Signed)
[name] Provider Note   CSN: [id number] Arrival date & time: 03/24/18  2001     History   Chief Complaint Chief Complaint  Patient presents with  . Dizziness    HPI [name] is a 52 y.o. male.  Patient is a 52 year old male with past medical history of hypertension, anxiety, and alcohol abuse.  He presents today with complaints of dizziness.  This is apparently been an ongoing problem for him for quite some time.  He reports dizziness when he changes position.  He was given meclizine by his primary doctor last week, however this has not helped.  He denies any headache, visual disturbance, fevers, vomiting, or diarrhea.  He denies any other specific complaints.  Patient is well-known to the emergency department for frequent visits and has a care plan.  The history is provided by the patient.  Dizziness  Quality:  Head spinning and imbalance Severity:  Moderate Timing:  Intermittent Progression:  Worsening Chronicity:  Chronic Context: bending over   Relieved by:  Nothing Worsened by:  Nothing Ineffective treatments:  None tried Associated symptoms: no blood in stool, no chest pain and no shortness of breath     Past Medical History:  Diagnosis Date  . Alcohol abuse   . Anxiety   . Anxiety   . Chronic neck pain   . Cocaine abuse (HCC)   . Hypertension   . Prediabetes     Patient Active Problem List   Diagnosis Date Noted  . Legally blind in right eye, as defined in [place] 05/02/2017  . Prediabetes 04/23/2017  . Muscle spasm 04/23/2017  . Essential hypertension 04/16/2017  . H/O medication noncompliance 04/02/2017  . Vitamin D deficiency 04/02/2017  . DJD (degenerative joint disease) of cervical spine 03/16/2017  . 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 Dizziness 01/23/2017  . Anxiety 11/21/2016    Past Surgical History:  Procedure Laterality Date  . EYE SURGERY Right ~ 1980   "hit my eye"        Home Medications     Prior to Admission medications   Medication Sig Start Date End Date Taking? Authorizing Provider  acetaminophen (TYLENOL) 500 MG tablet Take 1,000 mg by mouth daily as needed (back pain).   Yes [provider]  amLODipine (NORVASC) 10 MG tablet Take 1 tablet (10 mg total) by mouth daily. 02/22/18 05/23/18 Yes Claiborne Rigg, NP  atorvastatin (LIPITOR) 20 MG tablet Take 1 tablet (20 mg total) by mouth daily. 02/26/18  Yes Claiborne Rigg, NP  cyclobenzaprine (FLEXERIL) 10 MG tablet Take 1 tablet (10 mg total) by mouth 3 (three) times daily as needed for muscle spasms. 02/22/18  Yes Claiborne Rigg, NP  fluticasone (FLONASE) 50 MCG/ACT nasal spray Place 1 spray into both nostrils daily. 02/22/18  Yes Claiborne Rigg, NP  lisinopril (PRINIVIL,ZESTRIL) 5 MG tablet Take 1 tablet (5 mg total) by mouth daily. 02/22/18  Yes Claiborne Rigg, NP  meclizine (ANTIVERT) 25 MG tablet Take 1 tablet (25 mg total) by mouth 3 (three) times daily as needed for dizziness. 02/22/18  Yes Claiborne Rigg, NP  metFORMIN (GLUCOPHAGE) 500 MG tablet Take 1 tablet (500 mg total) by mouth daily with supper. 03/22/18  Yes Bing Neighbors, FNP  promethazine (PHENERGAN) 25 MG tablet Take 1 tablet (25 mg total) by mouth every 8 (eight) hours as needed for nausea or vomiting. 02/22/18  Yes Claiborne Rigg, NP  LORazepam (ATIVAN) 0.5 MG tablet Take  1 tablet (0.5 mg total) by mouth 2 (two) times daily. for anxiety Patient not taking: Reported on 03/24/2018 08/05/17   Elvina SidleLauenstein, Kurt, MD    Family History Family History  Problem Relation Age of Onset  . Hypertension Mother   . Hypertension Father     Social History Social History   Tobacco Use  . Smoking status: Former Games developermoker  . Smokeless tobacco: Never Used  Substance Use Topics  . Alcohol use: No    Comment: former  . Drug use: No    Comment: denies, reports past history      Allergies   Ibuprofen; Other; and Zofran [ondansetron hcl]   Review of  Systems Review of Systems  Respiratory: Negative for shortness of breath.   Cardiovascular: Negative for chest pain.  Gastrointestinal: Negative for blood in stool.  Neurological: Positive for dizziness.  All other systems reviewed and are negative.    Physical Exam Updated Vital Signs BP (!) 145/93 (BP Location: Left Arm)   Pulse 89   Temp 98.9 F (37.2 C) (Oral)   Resp 16   Ht 5\' 6"  (1.676 m)   Wt 90.7 kg (200 lb)   SpO2 98%   BMI 32.28 kg/m   Physical Exam  Constitutional: He is oriented to person, place, and time. He appears well-developed and well-nourished. No distress.  HENT:  Head: Normocephalic and atraumatic.  Mouth/Throat: Oropharynx is clear and moist.  Eyes: EOM are normal.  Right pupil is non-reactive.  Neck: Normal range of motion. Neck supple.  Cardiovascular: Normal rate and regular rhythm. Exam reveals no friction rub.  No murmur heard. Pulmonary/Chest: Effort normal and breath sounds normal. No respiratory distress. He has no wheezes. He has no rales.  Abdominal: Soft. Bowel sounds are normal. He exhibits no distension. There is no tenderness.  Musculoskeletal: Normal range of motion. He exhibits no edema.  Neurological: He is alert and oriented to person, place, and time. Coordination normal.  Skin: Skin is warm and dry. He is not diaphoretic.  Nursing note and vitals reviewed.    ED Treatments / Results  Labs (all labs ordered are listed, but only abnormal results are displayed) Labs Reviewed  URINALYSIS, ROUTINE W REFLEX MICROSCOPIC - Abnormal; Notable for the following components:      Result Value   Color, Urine STRAW (*)    All other components within normal limits  BASIC METABOLIC PANEL  CBC  CBG MONITORING, ED    EKG EKG Interpretation  Date/Time:  Sunday March 24 2018 20:07:34 EDT Ventricular Rate:  102 PR Interval:  128 QRS Duration: 100 QT Interval:  342 QTC Calculation: 445 R Axis:   -44 Text Interpretation:  Sinus  tachycardia Left axis deviation Abnormal ECG no significant change since March 12 2018 Confirmed by Pricilla LovelessGoldston, Scott [phone]([id number]) on 03/24/2018 10:35:01 PM   Radiology No results found.  Procedures Procedures (including critical care time)  Medications Ordered in ED Medications - No data to display   Initial Impression / Assessment and Plan / ED Course  I have reviewed the triage vital signs and the nursing notes.  Pertinent labs & imaging results that were available during my care of the patient were reviewed by me and considered in my medical decision making (see chart for details).  Patient well-known to the emergency department presenting with dizziness.  His neurologic exam is nonfocal and laboratory studies are reassuring.  Patient has a care plan in place due to multiple, frequent visits.  I see no indication  at this time for any imaging studies.  I have advised him to continue his meclizine and follow-up with his primary doctor.  Final Clinical Impressions(s) / ED Diagnoses   Final diagnoses:  None    ED Discharge Orders    None       Geoffery Lyons, MD 03/24/18 2325

## 2018-03-24 NOTE — Discharge Instructions (Addendum)
Continue your meclizine as previously prescribed.  Follow-up with your primary doctor if not improving in the next week.

## 2018-03-24 NOTE — ED Triage Notes (Signed)
Pt reports dizziness all day, reports hypertension 155/95. States he took a half of a 10mg  amlodipine. ETOH on board. Reports anxiety as well

## 2018-03-24 NOTE — ED Notes (Signed)
Pt requested CBG checked, 128 RN Victorino DikeJennifer informed

## 2018-03-25 ENCOUNTER — Ambulatory Visit (HOSPITAL_COMMUNITY)
Admission: EM | Admit: 2018-03-25 | Discharge: 2018-03-25 | Disposition: A | Discharge status: Home / Self Care | Payer: Self-pay | Attending: Family Medicine | Admitting: Family Medicine

## 2018-03-25 ENCOUNTER — Encounter (HOSPITAL_COMMUNITY): Payer: Self-pay | Admitting: Emergency Medicine

## 2018-03-25 ENCOUNTER — Ambulatory Visit (HOSPITAL_COMMUNITY)
Admission: EM | Admit: 2018-03-25 | Discharge: 2018-03-25 | Discharge status: Against Medical Advice | Payer: Self-pay | Attending: Family Medicine | Admitting: Family Medicine

## 2018-03-25 DIAGNOSIS — L309 Dermatitis, unspecified: Secondary | ICD-10-CM

## 2018-03-25 DIAGNOSIS — R42 Dizziness and giddiness: Secondary | ICD-10-CM

## 2018-03-25 MED ORDER — TRIAMCINOLONE ACETONIDE 0.1 % EX CREA
1.0000 | TOPICAL_CREAM | Freq: Two times a day (BID) | CUTANEOUS | 0 refills | Status: DC
Start: 2018-03-25 — End: 2018-04-17

## 2018-03-25 NOTE — ED Provider Notes (Signed)
[facility]    CSN: [id number] Arrival date & time: 03/25/18  1501     History   Chief Complaint Chief Complaint  Patient presents with  . Dizziness    HPI [name] is a 52 y.o. male.   [name] presents with complaints of persistent dizziness. Was seen in the Er [facility] for this with normal labs and EKG. Has been seen for this in the past with PCP, takes meclizine. States he took this today which did not help. States he has intermittent muscle spasms. Requesting refill of his lorazepam. Has appointment with PCP next week. Denies chest pain , headache, or head injury. No falls. Also states has a rash to RLQ of abdomen, itching. Hx of alcohol abuse, anxiety, neck pain, cocaine abuse, htn, prediabetes. Patient does have an ED care plan due to frequent ED visits.    ROS per HPI.      Past Medical History:  Diagnosis Date  . Alcohol abuse   . Anxiety   . Anxiety   . Chronic neck pain   . Cocaine abuse (HCC)   . Hypertension   . Prediabetes     Patient Active Problem List   Diagnosis Date Noted  . Legally blind in right eye, as defined in [place] 05/02/2017  . Prediabetes 04/23/2017  . Muscle spasm 04/23/2017  . Essential hypertension 04/16/2017  . H/O medication noncompliance 04/02/2017  . Vitamin D deficiency 04/02/2017  . DJD (degenerative joint disease) of cervical spine 03/16/2017  . 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 Dizziness 01/23/2017  . Anxiety 11/21/2016    Past Surgical History:  Procedure Laterality Date  . EYE SURGERY Right ~ 1980   "hit my eye"       Home Medications    Prior to Admission medications   Medication Sig Start Date End Date Taking? Authorizing Provider  acetaminophen (TYLENOL) 500 MG tablet Take 1,000 mg by mouth daily as needed (back pain).    [provider]  amLODipine (NORVASC) 10 MG tablet Take 1 tablet (10 mg total) by mouth daily. 02/22/18 05/23/18  Claiborne Rigg, NP  atorvastatin (LIPITOR) 20 MG tablet Take 1 tablet  (20 mg total) by mouth daily. 02/26/18   Claiborne Rigg, NP  cyclobenzaprine (FLEXERIL) 10 MG tablet Take 1 tablet (10 mg total) by mouth 3 (three) times daily as needed for muscle spasms. 02/22/18   Claiborne Rigg, NP  fluticasone (FLONASE) 50 MCG/ACT nasal spray Place 1 spray into both nostrils daily. 02/22/18   Claiborne Rigg, NP  lisinopril (PRINIVIL,ZESTRIL) 5 MG tablet Take 1 tablet (5 mg total) by mouth daily. 02/22/18   Claiborne Rigg, NP  LORazepam (ATIVAN) 0.5 MG tablet Take 1 tablet (0.5 mg total) by mouth 2 (two) times daily. for anxiety Patient not taking: Reported on 03/24/2018 08/05/17   Elvina Sidle, MD  meclizine (ANTIVERT) 25 MG tablet Take 1 tablet (25 mg total) by mouth 3 (three) times daily as needed for dizziness. 02/22/18   Claiborne Rigg, NP  metFORMIN (GLUCOPHAGE) 500 MG tablet Take 1 tablet (500 mg total) by mouth daily with supper. 03/22/18   Bing Neighbors, FNP  promethazine (PHENERGAN) 25 MG tablet Take 1 tablet (25 mg total) by mouth every 8 (eight) hours as needed for nausea or vomiting. 02/22/18   Claiborne Rigg, NP  triamcinolone cream (KENALOG) 0.1 % Apply 1 application topically 2 (two) times daily. 03/25/18   Georgetta Haber, NP    Family History Family  History  Problem Relation Age of Onset  . Hypertension Mother   . Hypertension Father     Social History Social History   Tobacco Use  . Smoking status: Former Games developer  . Smokeless tobacco: Never Used  Substance Use Topics  . Alcohol use: No    Comment: former  . Drug use: No    Comment: denies, reports past history      Allergies   Ibuprofen; Other; and Zofran [ondansetron hcl]   Review of Systems Review of Systems   Physical Exam Triage Vital Signs ED Triage Vitals [03/25/18 1535]  Enc Vitals Group     BP 116/79     Pulse Rate 88     Resp      Temp 98 F (36.7 C)     Temp Source Oral     SpO2 99 %     Weight      Height      Head Circumference      Peak Flow       Pain Score      Pain Loc      Pain Edu?      Excl. in GC?    No data found.  Updated Vital Signs BP 116/79 (BP Location: Left Arm)   Pulse 88   Temp 98 F (36.7 C) (Oral)   SpO2 99%   Visual Acuity Right Eye Distance:   Left Eye Distance:   Bilateral Distance:    Right Eye Near:   Left Eye Near:    Bilateral Near:     Physical Exam  Constitutional: He is oriented to person, place, and time. He appears well-developed and well-nourished.  Cardiovascular: Normal rate and regular rhythm.  Pulmonary/Chest: Effort normal and breath sounds normal.  Neurological: He is alert and oriented to person, place, and time. No cranial nerve deficit or sensory deficit. He exhibits normal muscle tone. Coordination normal.  Skin: Skin is warm and dry.  Dry scaly rash to RLQ at belt line noted; no redness; non tender, no drainage      UC Treatments / Results  Labs (all labs ordered are listed, but only abnormal results are displayed) Labs Reviewed - No data to display  EKG None  Radiology No results found.  Procedures Procedures (including critical care time)  Medications Ordered in UC Medications - No data to display  Initial Impression / Assessment and Plan / UC Course  I have reviewed the triage vital signs and the nursing notes.  Pertinent labs & imaging results that were available during my care of the patient were reviewed by me and considered in my medical decision making (see chart for details).     Kenalog for rash provided. No acute neurological findings. Discussed with patient that controlled substances are not managed through urgent care. To continue to follow up with PCP for recheck and management of medications. Patient verbalized understanding and agreeable to plan.  Ambulatory out of clinic without difficulty.    Final Clinical Impressions(s) / UC Diagnoses   Final diagnoses:  Dizziness  Dermatitis     Discharge Instructions     Use of kenalog cream to  rash.  Exam and review of work up from ED is normal today. Please follow up with your primary care provider for management of your anxiety medication.    ED Prescriptions    Medication Sig Dispense Auth. Provider   triamcinolone cream (KENALOG) 0.1 % Apply 1 application topically 2 (two) times daily. 30 g Burky,  Barron AlvineNatalie B, NP     Controlled Substance Prescriptions [place] Controlled Substance Registry consulted? Not Applicable   Georgetta HaberBurky, Natalie B, NP 03/25/18 1657

## 2018-03-25 NOTE — ED Notes (Signed)
Called x2 for room 

## 2018-03-25 NOTE — ED Triage Notes (Signed)
Pt here for chronic dizziness and HTN.

## 2018-03-25 NOTE — ED Triage Notes (Signed)
Pt returning to UC after leaving after triage earlier today.  Pt complains of continued dizziness and shaking.

## 2018-03-25 NOTE — ED Notes (Signed)
Pt called to room x3 No answer.  Pt LWBS after triage.

## 2018-03-25 NOTE — Discharge Instructions (Signed)
Use of kenalog cream to rash.  Exam and review of work up from ED is normal today. Please follow up with your primary care provider for management of your anxiety medication.

## 2018-04-07 ENCOUNTER — Ambulatory Visit (HOSPITAL_COMMUNITY): Admission: EM | Admit: 2018-04-07 | Discharge: 2018-04-07 | Discharge status: Against Medical Advice | Payer: Self-pay

## 2018-04-07 NOTE — ED Notes (Signed)
Unable to locate patient for triage.

## 2018-04-11 ENCOUNTER — Encounter (HOSPITAL_COMMUNITY): Payer: Self-pay

## 2018-04-11 ENCOUNTER — Ambulatory Visit (HOSPITAL_COMMUNITY)
Admission: EM | Admit: 2018-04-11 | Discharge: 2018-04-11 | Discharge status: Absent without leave | Payer: Self-pay | Attending: Family Medicine | Admitting: Family Medicine

## 2018-04-11 ENCOUNTER — Emergency Department (HOSPITAL_COMMUNITY)
Admission: EM | Admit: 2018-04-11 | Discharge: 2018-04-11 | Discharge status: Against Medical Advice | Payer: Self-pay | Attending: Emergency Medicine | Admitting: Emergency Medicine

## 2018-04-11 ENCOUNTER — Other Ambulatory Visit: Payer: Self-pay

## 2018-04-11 DIAGNOSIS — R51 Headache: Secondary | ICD-10-CM | POA: Insufficient documentation

## 2018-04-11 DIAGNOSIS — Z5321 Procedure and treatment not carried out due to patient leaving prior to being seen by health care provider: Secondary | ICD-10-CM | POA: Insufficient documentation

## 2018-04-11 LAB — COMPREHENSIVE METABOLIC PANEL
ALT: 31 U/L (ref 0–44)
AST: 29 U/L (ref 15–41)
Albumin: 4.1 g/dL (ref 3.5–5.0)
Alkaline Phosphatase: 85 U/L (ref 38–126)
Anion gap: 11 (ref 5–15)
BUN: 10 mg/dL (ref 6–20)
CO2: 22 mmol/L (ref 22–32)
Calcium: 8.9 mg/dL (ref 8.9–10.3)
Chloride: 102 mmol/L (ref 98–111)
Creatinine, Ser: 0.75 mg/dL (ref 0.61–1.24)
GFR calc Af Amer: >60 mL/min (ref >60)
GFR calc non Af Amer: >60 mL/min (ref >60)
Glucose, Bld: 151 mg/dL — ABNORMAL HIGH (ref 70–99)
Potassium: 3.5 mmol/L (ref 3.5–5.1)
Sodium: 135 mmol/L (ref 135–145)
Total Bilirubin: 0.5 mg/dL (ref 0.3–1.2)
Total Protein: 7.5 g/dL (ref 6.5–8.1)

## 2018-04-11 LAB — CBC WITH DIFFERENTIAL/PLATELET
Abs Immature Granulocytes: 0 10*3/uL (ref 0.0–0.1)
Basophils Absolute: 0 10*3/uL (ref 0.0–0.1)
Basophils Relative: 1 %
Eosinophils Absolute: 0.2 10*3/uL (ref 0.0–0.7)
Eosinophils Relative: 4 %
HCT: 43.1 % (ref 39.0–52.0)
Hemoglobin: 13.8 g/dL (ref 13.0–17.0)
Immature Granulocytes: 0 %
Lymphocytes Relative: 34 %
Lymphs Abs: 2 10*3/uL (ref 0.7–4.0)
MCH: 26.2 pg (ref 26.0–34.0)
MCHC: 32 g/dL (ref 30.0–36.0)
MCV: 81.9 fL (ref 78.0–100.0)
Monocytes Absolute: 0.6 10*3/uL (ref 0.1–1.0)
Monocytes Relative: 10 %
Neutro Abs: 3 10*3/uL (ref 1.7–7.7)
Neutrophils Relative %: 51 %
Platelets: 285 10*3/uL (ref 150–400)
RBC: 5.26 MIL/uL (ref 4.22–5.81)
RDW: 13.4 % (ref 11.5–15.5)
WBC: 5.8 10*3/uL (ref 4.0–10.5)

## 2018-04-11 LAB — CBG MONITORING, ED: Glucose-Capillary: 162 mg/dL — ABNORMAL HIGH (ref 70–99)

## 2018-04-11 NOTE — ED Provider Notes (Addendum)
Patient placed in Quick Look pathway, seen and evaluated   Chief Complaint: Headache  HPI:   Patient presents with a headache today.  He reports his blood pressure was elevated at home.  He reports that he has had a heat radiating up from his legs into his head.  He denies any chest pain or shortness of breath.  He has had associated dizziness and lightheadedness.  He took his blood pressure medication today, amlodipine.  He denies any alcohol use today.  ROS: +Headache, dizziness, vision change (blurry); - chest pain, shortness of breath  Physical Exam:   Gen: No distress  Neuro: Awake and Alert  Skin: Warm    Focused Exam: CN 3-12 intact; normal sensation throughout; 5/5 strength in all 4 extremities; equal bilateral grip strength; heart tachycardic, rate 105, regular rhythm, lungs clear to auscultation   Initiation of care has begun. The patient has been counseled on the process, plan, and necessity for staying for the completion/evaluation, and the remainder of the medical screening examination    Emi HolesLaw, Alexandra M, PA-C 04/11/18 1910    Emi HolesLaw, Alexandra M, PA-C 04/26/18 0818    Marily MemosMesner, Jason, MD 05/01/18 1622

## 2018-04-11 NOTE — ED Triage Notes (Signed)
Pt states that he has had a headache today with a heat radiating from his legs into his head. Pt reports he has been taking his anxiety medication and his HTN medications. Denies any ETOH today. [facility] and reports worsening symptoms since.

## 2018-04-11 NOTE — ED Notes (Addendum)
Pt. Called back for room x3. No response.

## 2018-04-11 NOTE — ED Notes (Signed)
Pt. Called for vitals with no response. 

## 2018-04-14 ENCOUNTER — Other Ambulatory Visit: Payer: Self-pay

## 2018-04-14 ENCOUNTER — Encounter (HOSPITAL_COMMUNITY): Payer: Self-pay | Admitting: Emergency Medicine

## 2018-04-14 ENCOUNTER — Emergency Department (HOSPITAL_COMMUNITY)
Admission: EM | Admit: 2018-04-14 | Discharge: 2018-04-14 | Disposition: A | Discharge status: Home / Self Care | Payer: Self-pay | Attending: Emergency Medicine | Admitting: Emergency Medicine

## 2018-04-14 DIAGNOSIS — Z79899 Other long term (current) drug therapy: Secondary | ICD-10-CM | POA: Insufficient documentation

## 2018-04-14 DIAGNOSIS — F419 Anxiety disorder, unspecified: Secondary | ICD-10-CM | POA: Insufficient documentation

## 2018-04-14 DIAGNOSIS — I10 Essential (primary) hypertension: Secondary | ICD-10-CM | POA: Insufficient documentation

## 2018-04-14 DIAGNOSIS — Z87891 Personal history of nicotine dependence: Secondary | ICD-10-CM | POA: Insufficient documentation

## 2018-04-14 LAB — CBC
HCT: 41.4 % (ref 39.0–52.0)
Hemoglobin: 13 g/dL (ref 13.0–17.0)
MCH: 26 pg (ref 26.0–34.0)
MCHC: 31.4 g/dL (ref 30.0–36.0)
MCV: 82.8 fL (ref 78.0–100.0)
Platelets: 265 10*3/uL (ref 150–400)
RBC: 5 MIL/uL (ref 4.22–5.81)
RDW: 13.7 % (ref 11.5–15.5)
WBC: 8.1 10*3/uL (ref 4.0–10.5)

## 2018-04-14 LAB — BASIC METABOLIC PANEL
Anion gap: 11 (ref 5–15)
BUN: 10 mg/dL (ref 6–20)
CO2: 25 mmol/L (ref 22–32)
Calcium: 8.8 mg/dL — ABNORMAL LOW (ref 8.9–10.3)
Chloride: 99 mmol/L (ref 98–111)
Creatinine, Ser: 0.78 mg/dL (ref 0.61–1.24)
GFR calc Af Amer: >60 mL/min (ref >60)
GFR calc non Af Amer: >60 mL/min (ref >60)
Glucose, Bld: 115 mg/dL — ABNORMAL HIGH (ref 70–99)
Potassium: 3.2 mmol/L — ABNORMAL LOW (ref 3.5–5.1)
Sodium: 135 mmol/L (ref 135–145)

## 2018-04-14 LAB — I-STAT TROPONIN, ED: Troponin i, poc: 0 ng/mL (ref 0.00–0.08)

## 2018-04-14 MED ORDER — METOCLOPRAMIDE HCL 5 MG/ML IJ SOLN
10.0000 mg | Freq: Once | INTRAMUSCULAR | Status: AC
  Administered 2018-04-14: 10 mg via INTRAVENOUS
  Filled 2018-04-14: qty 2

## 2018-04-14 NOTE — ED Notes (Signed)
Pt called out requesting medication for nausea and to see a nurse. Reita ClicheBobby, RN and Autumn, RN notified.

## 2018-04-14 NOTE — Discharge Instructions (Signed)
Please read and follow all provided instructions.  Your diagnoses today include:  1. Essential hypertension   2. Anxiety     Tests performed today include:  An EKG of your heart  Cardiac enzymes - a blood test for heart muscle damage  Blood counts and electrolytes  Vital signs. See below for your results today.   Medications prescribed:   None  Take any prescribed medications only as directed.  Follow-up instructions: Please follow-up with your primary care provider next week for blood pressure recheck.   Return instructions:  SEEK IMMEDIATE MEDICAL ATTENTION IF:  You have severe chest pain, especially if the pain is crushing or pressure-like and spreads to the arms, back, neck, or jaw, or if you have sweating, nausea (feeling sick to your stomach), or shortness of breath. THIS IS AN EMERGENCY. Don't wait to see if the pain will go away. Get medical help at once. Call 911 or 0 (operator). DO NOT drive yourself to the hospital.   Your chest pain gets worse and does not go away with rest.   You have an attack of chest pain lasting longer than usual, despite rest and treatment with the medications your caregiver has prescribed.   You wake from sleep with chest pain or shortness of breath.  You feel dizzy or faint.  You have chest pain not typical of your usual pain for which you originally saw your caregiver.   You have any other emergent concerns regarding your health.  Additional Information: Chest pain comes from many different causes. Your caregiver has diagnosed you as having chest pain that is not specific for one problem, but does not require admission.  You are at low risk for an acute heart condition or other serious illness.   Your vital signs today were: BP (!) 145/94 (BP Location: Right Arm)    Pulse (!) 104    Temp 97.9 F (36.6 C) (Oral)    Resp 16    SpO2 100%  If your blood pressure (BP) was elevated above 135/85 this visit, please have this repeated by  your doctor within one month. --------------

## 2018-04-14 NOTE — ED Triage Notes (Addendum)
Pt states he awoke this morning feeling that his bp was high.States he checked it at home and also felt dizzy so wanted to come and be evaluated. Hx of same. Compliant with meds per pt.  Denies chest pain

## 2018-04-14 NOTE — ED Provider Notes (Signed)
[facility] EMERGENCY DEPARTMENT Provider Note   CSN: [id number] Arrival date & time: 04/14/18  0516     History   Chief Complaint Chief Complaint  Patient presents with  . Hypertension    HPI Wesley [name] is a 52 y.o. male.  Patient with history of anxiety, hypertension on amlodipine, alcohol abuse --presents to the emergency department today with complaints of dizziness and high blood pressure.  Patient states that he awoke from sleep a couple of hours ago feeling very lightheaded like he was going to pass out.  He denies any full syncope.  No abdominal pain or chest pain.  No headache, vision change.  No treatments prior to arrival other than his home Ativan and amlodipine 10 mg.  Patient states that he forgot to take his blood pressure medications yesterday.  Denies drug use, but does admit to drinking 1/2 beer yesterday for his mother's birthday.  Patient denies risk factors for pulmonary embolism including: unilateral leg swelling, history of DVT/PE/other blood clots, use of exogenous hormones, recent immobilizations, recent surgery, recent travel (>4hr segment), malignancy, hemoptysis.  The onset of this condition was acute.  The course is constant. Aggravating factors: none. Alleviating factors: none.       Past Medical History:  Diagnosis Date  . Alcohol abuse   . Anxiety   . Anxiety   . Chronic neck pain   . Cocaine abuse (HCC)   . Hypertension   . Prediabetes     Patient Active Problem List   Diagnosis Date Noted  . Legally blind in right eye, as defined in [place] 05/02/2017  . Prediabetes 04/23/2017  . Muscle spasm 04/23/2017  . Essential hypertension 04/16/2017  . H/O medication noncompliance 04/02/2017  . Vitamin D deficiency 04/02/2017  . DJD (degenerative joint disease) of cervical spine 03/16/2017  . 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 Dizziness 01/23/2017  . Anxiety 11/21/2016    Past Surgical History:  Procedure Laterality Date  . EYE SURGERY  Right ~ 1980   "hit my eye"        Home Medications    Prior to Admission medications   Medication Sig Start Date End Date Taking? Authorizing Provider  acetaminophen (TYLENOL) 500 MG tablet Take 1,000 mg by mouth daily as needed (back pain).    [provider]  amLODipine (NORVASC) 10 MG tablet Take 1 tablet (10 mg total) by mouth daily. 02/22/18 05/23/18  Claiborne Rigg, NP  atorvastatin (LIPITOR) 20 MG tablet Take 1 tablet (20 mg total) by mouth daily. 02/26/18   Claiborne Rigg, NP  cyclobenzaprine (FLEXERIL) 10 MG tablet Take 1 tablet (10 mg total) by mouth 3 (three) times daily as needed for muscle spasms. 02/22/18   Claiborne Rigg, NP  fluticasone (FLONASE) 50 MCG/ACT nasal spray Place 1 spray into both nostrils daily. 02/22/18   Claiborne Rigg, NP  lisinopril (PRINIVIL,ZESTRIL) 5 MG tablet Take 1 tablet (5 mg total) by mouth daily. 02/22/18   Claiborne Rigg, NP  LORazepam (ATIVAN) 0.5 MG tablet Take 1 tablet (0.5 mg total) by mouth 2 (two) times daily. for anxiety Patient not taking: Reported on 03/24/2018 08/05/17   Elvina Sidle, MD  meclizine (ANTIVERT) 25 MG tablet Take 1 tablet (25 mg total) by mouth 3 (three) times daily as needed for dizziness. 02/22/18   Claiborne Rigg, NP  metFORMIN (GLUCOPHAGE) 500 MG tablet Take 1 tablet (500 mg total) by mouth daily with supper. 03/22/18   Bing Neighbors, FNP  promethazine (PHENERGAN) 25 MG tablet Take 1 tablet (25 mg total) by mouth every 8 (eight) hours as needed for nausea or vomiting. 02/22/18   Claiborne RiggFleming, Zelda W, NP  triamcinolone cream (KENALOG) 0.1 % Apply 1 application topically 2 (two) times daily. 03/25/18   Georgetta HaberBurky, Natalie B, NP    Family History Family History  Problem Relation Age of Onset  . Hypertension Mother   . Hypertension Father     Social History Social History   Tobacco Use  . Smoking status: Former Games developermoker  . Smokeless tobacco: Never Used  Substance Use Topics  . Alcohol use: No     Comment: former  . Drug use: No    Comment: denies, reports past history      Allergies   Ibuprofen; Other; and Zofran [ondansetron hcl]   Review of Systems Review of Systems  Constitutional: Negative for diaphoresis and fever.  Eyes: Negative for redness.  Respiratory: Negative for cough and shortness of breath.   Cardiovascular: Negative for chest pain, palpitations and leg swelling.  Gastrointestinal: Negative for abdominal pain, nausea and vomiting.  Genitourinary: Negative for dysuria.  Musculoskeletal: Negative for back pain and neck pain.  Skin: Negative for rash.  Neurological: Positive for light-headedness. Negative for syncope.  Psychiatric/Behavioral: The patient is nervous/anxious.      Physical Exam Updated Vital Signs BP (!) 132/93 (BP Location: Right Arm)   Pulse (!) 102   Temp 97.9 F (36.6 C) (Oral)   Resp 16   SpO2 100%   Physical Exam  Constitutional: He appears well-developed and well-nourished.  HENT:  Head: Normocephalic and atraumatic.  Mouth/Throat: Mucous membranes are normal. Mucous membranes are not dry.  Eyes: Conjunctivae are normal.  Neck: Trachea normal and normal range of motion. Neck supple. Normal carotid pulses and no JVD present. No muscular tenderness present. Carotid bruit is not present. No tracheal deviation present.  Cardiovascular: Normal rate, regular rhythm, S1 normal, S2 normal, normal heart sounds and intact distal pulses. Exam reveals no distant heart sounds and no decreased pulses.  No murmur heard. Pulmonary/Chest: Effort normal and breath sounds normal. No respiratory distress. He has no wheezes. He exhibits no tenderness.  Abdominal: Soft. Normal aorta and bowel sounds are normal. There is no tenderness. There is no rebound and no guarding.  Musculoskeletal: He exhibits no edema.  Neurological: He is alert.  Skin: Skin is warm and dry. He is not diaphoretic. No cyanosis. No pallor.  Psychiatric: His mood appears  anxious.  Nursing note and vitals reviewed.    ED Treatments / Results  Labs (all labs ordered are listed, but only abnormal results are displayed) Labs Reviewed  BASIC METABOLIC PANEL - Abnormal; Notable for the following components:      Result Value   Potassium 3.2 (*)    Glucose, Bld 115 (*)    Calcium 8.8 (*)    All other components within normal limits  CBC  I-STAT TROPONIN, ED    ED ECG REPORT   Date: 04/14/2018  Rate: 114  Rhythm: sinus tachycardia  QRS Axis: left  Intervals: normal  ST/T Wave abnormalities: normal  Conduction Disutrbances:none  Narrative Interpretation:   Old EKG Reviewed: changes noted from most recent EKG however very similar to EKGs performed on 03/08/2018 and 01/27/2018.  I have personally reviewed the EKG tracing and agree with the computerized printout as noted.  Radiology No results found.  Procedures Procedures (including critical care time)  Medications Ordered in ED Medications  metoCLOPramide (REGLAN) injection  10 mg (10 mg Intravenous Given 04/14/18 0659)     Initial Impression / Assessment and Plan / ED Course  I have reviewed the triage vital signs and the nursing notes.  Pertinent labs & imaging results that were available during my care of the patient were reviewed by me and considered in my medical decision making (see chart for details).     Patient seen and examined. Work-up initiated.   Vital signs reviewed and are as follows: BP (!) 145/94 (BP Location: Right Arm)   Pulse (!) 104   Temp 97.9 F (36.6 C) (Oral)   Resp 16   SpO2 100%   7:00 AM patient requesting medication for nausea.  We reviewed his lab results.  No signs of heart trouble today.  He voices being worried about having drank alcohol yesterday and taking an Ativan this morning.  I tried to encourage him not to do this in the future but reassure him that it does not appear to have caused any problems today.  Patient will be discharged home.   Encouraged to follow-up with his primary care doctor tomorrow for blood pressure recheck as he feels that his blood pressure is getting too high at home.  Final Clinical Impressions(s) / ED Diagnoses   Final diagnoses:  Essential hypertension  Anxiety   Patient with hypertension and anxiety, lightheadedness without syncope: EKG reviewed and is similar to past few months.  No risk factors for PE.  Mild tachycardia which I suspect due to is due to anxiety.  When I enter the room after he is resting, heart rate is in 80s.  Do not feel that screening for PE is indicated at this time.  Patient is not hypoxic and has no chest pain.  Troponin is negative.  Feel that patient has been adequately screened at this time and will be discharged to home with PCP follow-up.   ED Discharge Orders    None       Renne Crigler, [place] 04/14/18 9147    Shon Baton, MD 04/15/18 [phone]

## 2018-04-17 ENCOUNTER — Emergency Department (HOSPITAL_COMMUNITY)
Admission: EM | Admit: 2018-04-17 | Discharge: 2018-04-17 | Disposition: A | Discharge status: Home / Self Care | Payer: Self-pay | Attending: Emergency Medicine | Admitting: Emergency Medicine

## 2018-04-17 ENCOUNTER — Encounter (HOSPITAL_COMMUNITY): Payer: Self-pay | Admitting: Emergency Medicine

## 2018-04-17 ENCOUNTER — Ambulatory Visit (HOSPITAL_COMMUNITY)
Admission: EM | Admit: 2018-04-17 | Discharge: 2018-04-17 | Disposition: A | Discharge status: Home / Self Care | Payer: Self-pay | Attending: Family Medicine | Admitting: Family Medicine

## 2018-04-17 ENCOUNTER — Other Ambulatory Visit: Payer: Self-pay

## 2018-04-17 DIAGNOSIS — R42 Dizziness and giddiness: Secondary | ICD-10-CM

## 2018-04-17 DIAGNOSIS — Z5321 Procedure and treatment not carried out due to patient leaving prior to being seen by health care provider: Secondary | ICD-10-CM | POA: Insufficient documentation

## 2018-04-17 DIAGNOSIS — I10 Essential (primary) hypertension: Secondary | ICD-10-CM | POA: Insufficient documentation

## 2018-04-17 DIAGNOSIS — R21 Rash and other nonspecific skin eruption: Secondary | ICD-10-CM

## 2018-04-17 DIAGNOSIS — L230 Allergic contact dermatitis due to metals: Secondary | ICD-10-CM

## 2018-04-17 DIAGNOSIS — R739 Hyperglycemia, unspecified: Secondary | ICD-10-CM

## 2018-04-17 LAB — GLUCOSE, CAPILLARY: Glucose-Capillary: 98 mg/dL (ref 70–99)

## 2018-04-17 MED ORDER — TRIAMCINOLONE ACETONIDE 0.1 % EX CREA: 1.0000 "application " | TOPICAL_CREAM | Freq: Two times a day (BID) | CUTANEOUS | 1 refills | Status: DC

## 2018-04-17 NOTE — ED Notes (Signed)
Called x 2 NO answer 

## 2018-04-17 NOTE — ED Triage Notes (Signed)
Patient c/o elevated BP. States that he took his BP which was 165/110 (taken before he took his amlodipine, and THEN took his amlodipine.

## 2018-04-17 NOTE — ED Notes (Signed)
Called x 3 no answer 

## 2018-04-17 NOTE — ED Notes (Signed)
Pt does not want a hallway bed

## 2018-04-17 NOTE — ED Triage Notes (Signed)
Pt here for dizziness; pt seen multiple times in past for same; pt appears anxious at present; pt seen in ED this am

## 2018-04-18 NOTE — ED Provider Notes (Signed)
[facility] CARE CENTER   [id number] 04/17/18 Arrival Time: 1527  ASSESSMENT & PLAN:  1. Dizziness   2. Nickel dermatitis   3. Elevated blood sugar level     Meds ordered this encounter  Medications  . triamcinolone cream (KENALOG) 0.1 %    Sig: Apply 1 application topically 2 (two) times daily. Use for 1-2 weeks then as needed.    Dispense:  30 g    Refill:  1    Reassured Julen that these symptoms do not appear to represent a serious or threatening condition.  Follow-up Information    Claiborne Rigg, NP.   Specialty:  Nurse Practitioner Why:  To discuss your trend of elevated blood sugar as we discussed. Your blood sugar this evening was 98. Contact information: [address] [place] [address] [phone]          Will proceed to the ED if he develops other symptoms such as alterations of speech, swallowing, vision, motor/sensory systems, or if dizziness worsens.  Reviewed expectations re: course of current medical issues. Questions answered. Outlined signs and symptoms indicating need for more acute intervention. Patient verbalized understanding. After Visit Summary given.   SUBJECTIVE:  Wesley [name] is a 52 y.o. male who presents for evaluation of long-standing intermittent dizziness described as lightheadedness. No change from previous episodes. "Just woke up feeling lightheaded." Has since resolved. No change in his medical history. Associated ear symptoms: none. Associated CNS symptoms: none. Patient denies otalgia tinnitus hearing loss. Recent infections: none. Head trauma: denied. Drug ingestion: none. Noise exposure: no occupational exposure. Admits that he still struggles with anxiety over potential medical problems.  Also reports rash on lower abdomen. Itches. Present for many months. No home treatment.  ROS: As per HPI.   OBJECTIVE:  Vitals:   04/17/18 1606  BP: 133/89  Pulse: 94  Resp: (!) 22  Temp: 98 F (36.7 C)  TempSrc: Oral    SpO2: 98%    General appearance: alert; no distress Eyes: PERRLA; EOMI; conjunctiva normal HENT: normocephalic; atraumatic; TMs normal; nasal mucosa normal; oral mucosa normal Neck: supple with FROM Lungs: clear to auscultation bilaterally Heart: regular rate and rhythm Extremities: no edema; symmetrical with no gross deformities Skin: warm and dry; contact dermatitis of lower abdomen from his belt +/- buttons on his jeans Neurologic: normal gait; DTR's normal and symmetric; CN 2-12 grossly intact Psychological: alert and cooperative; normal mood and affect  Investigations: Results for orders placed or performed during the hospital encounter of 04/17/18  Glucose, capillary  Result Value Ref Range   Glucose-Capillary 98 70 - 99 mg/dL   Labs Reviewed  GLUCOSE, CAPILLARY    Allergies  Allergen Reactions  . Ibuprofen Other (See Comments) and Rash    Makes his throat get "very dry" after taking it States he cant take it  . Other Other (See Comments)    Steroids: Patient received a steroid shot from [facility] (336) [phone] and it resulted in his muscles twitching all over his body  . Zofran [Ondansetron Hcl] Other (See Comments)    Causes sweating and body "feels badly"    Past Medical History:  Diagnosis Date  . Alcohol abuse   . Anxiety   . Anxiety   . Chronic neck pain   . Cocaine abuse (HCC)   . Hypertension   . Prediabetes    Social History   Socioeconomic History  . Marital status: Significant Other    Spouse name: Not on file  .  Number of children: Not on file  . Years of education: Not on file  . Highest education level: Not on file  Occupational History  . Not on file  Social Needs  . Financial resource strain: Not on file  . Food insecurity:    Worry: Not on file    Inability: Not on file  . Transportation needs:    Medical: Not on file    Non-medical: Not on file  Tobacco Use  . Smoking status: Former Games developermoker  . Smokeless tobacco:  Never Used  Substance and Sexual Activity  . Alcohol use: No    Comment: former  . Drug use: No    Comment: denies, reports past history   . Sexual activity: Not Currently  Lifestyle  . Physical activity:    Days per week: Not on file    Minutes per session: Not on file  . Stress: Not on file  Relationships  . Social connections:    Talks on phone: Not on file    Gets together: Not on file    Attends religious service: Not on file    Active member of club or organization: Not on file    Attends meetings of clubs or organizations: Not on file    Relationship status: Not on file  . Intimate partner violence:    Fear of current or ex partner: Not on file    Emotionally abused: Not on file    Physically abused: Not on file    Forced sexual activity: Not on file  Other Topics Concern  . Not on file  Social History Narrative  . Not on file   Family History  Problem Relation Age of Onset  . Hypertension Mother   . Hypertension Father    Past Surgical History:  Procedure Laterality Date  . EYE SURGERY Right ~ 1980   "hit my eye"      Mardella LaymanHagler, Brian, MD 04/18/18 1126

## 2018-04-19 ENCOUNTER — Ambulatory Visit: Payer: Self-pay | Attending: Nurse Practitioner | Admitting: Nurse Practitioner

## 2018-04-19 ENCOUNTER — Encounter: Payer: Self-pay | Admitting: Nurse Practitioner

## 2018-04-19 VITALS — BP 123/85 | HR 91 | Temp 98.4°F | Ht 68.0 in | Wt 204.4 lb

## 2018-04-19 DIAGNOSIS — I10 Essential (primary) hypertension: Secondary | ICD-10-CM | POA: Insufficient documentation

## 2018-04-19 DIAGNOSIS — F419 Anxiety disorder, unspecified: Secondary | ICD-10-CM | POA: Insufficient documentation

## 2018-04-19 DIAGNOSIS — Z888 Allergy status to other drugs, medicaments and biological substances status: Secondary | ICD-10-CM | POA: Insufficient documentation

## 2018-04-19 DIAGNOSIS — Z8249 Family history of ischemic heart disease and other diseases of the circulatory system: Secondary | ICD-10-CM | POA: Insufficient documentation

## 2018-04-19 DIAGNOSIS — R7303 Prediabetes: Secondary | ICD-10-CM | POA: Insufficient documentation

## 2018-04-19 DIAGNOSIS — R42 Dizziness and giddiness: Secondary | ICD-10-CM | POA: Insufficient documentation

## 2018-04-19 DIAGNOSIS — Z7984 Long term (current) use of oral hypoglycemic drugs: Secondary | ICD-10-CM | POA: Insufficient documentation

## 2018-04-19 DIAGNOSIS — Z886 Allergy status to analgesic agent status: Secondary | ICD-10-CM | POA: Insufficient documentation

## 2018-04-19 LAB — GLUCOSE, POCT (MANUAL RESULT ENTRY): POC Glucose: 112 mg/dl — AB (ref 70–99)

## 2018-04-19 MED ORDER — MECLIZINE HCL 25 MG PO TABS: 25.0000 mg | ORAL_TABLET | Freq: Three times a day (TID) | ORAL | 0 refills | Status: DC | PRN

## 2018-04-19 MED ORDER — LISINOPRIL 5 MG PO TABS: 5.0000 mg | ORAL_TABLET | Freq: Every day | ORAL | 0 refills | Status: DC

## 2018-04-19 MED ORDER — LISINOPRIL 5 MG PO TABS: 5.0000 mg | ORAL_TABLET | Freq: Every day | ORAL | 3 refills | Status: DC

## 2018-04-19 NOTE — Patient Instructions (Signed)
Behavioral Health Resources:  ? ?What if I or someone I know is in crisis? ? ?If you are thinking about harming yourself or having thoughts of suicide, or if you know someone who is, seek help right away. ? ?Call your doctor or mental health care provider. ? ?Call 911 or go to a hospital emergency room to get immediate help, or ask a friend or family member to help you do these things. ? ?Call the [place] National Suicide Prevention Lifeline?s toll-free, 24-hour hotline at 1-800-273-TALK (1-800-273-8255) or TTY: 1-800-799-4 TTY (1-800-799-4889) to talk to a trained counselor. ? ?If you are in crisis, make sure you are not left alone.  ? ?If someone else is in crisis, make sure he or she is not left alone ? ? ?24 Hour Availability ? ?[facility]  ?[address], [place], [place] [address]  ?336-832-9700 or 1-800-711-2635 ? ?[facility] Crisis Line ?(Domestic Violence, Rape & Victim Assistance ?336-273-7273 ? ?[facility]  ?[address] [place], [place]  [address]               1-855-788-8787 or 336-676-6840 ? ?[facility] [place] [facility]    ?(ONLY from 8am-4pm)    ?336-899-1505 ? ?[facility] (24/7)   ?1-877-626-1772 ? ?[place] National Suicide Hotline   ?1-800-273-8255 (TALK) ? ?Support from local police to aid getting patient to hospital (http://www.[place]-.gov/index.aspx?page=2797) ? ? ?      ?ONGOING BEHAVIORAL HEALTH SUPPORT FOR UNINSURED and UNDERINSURED:  ?[facility]  ?336-676-6840  ?[address]  ?Walk-in first time, Monday-Friday, 8:30am-5:00pm  ?*Bring snack, drink, something to do, long wait at first visit, they do have pharmacy for behavioral health medications/ Bring own interpreter at first visit, if needed ?[facility]  ?336-387-6161  ?[address]  ?Walk-in Monday-Friday, 8:30am-12pm & 1-2:30pm  ?*pacientes que hablen espanol, favor comunicarse con el Sr.  [place], extension 2244 o la [address], extension 3331 para hacer una cita  ?[facility]:  ?336-429-5600 or kellinfoundation@gmail.com  ?[address], [address]  ?Call or email, may self-refer  ?* uninsured/underinsured, 19-64yo, have both mental health and substance use challenges  ?[facility]:  ?Phone (336) [phone]; Fax [phone]  ?*Call to schedule an appointment  ?3rd Floor located @?[address], [address]  ?Mon-Thursday: 8:30am-8:00pm Friday: 8:30am-7:00pm  ?* Be sure to park in a space labeled ?Psychology Department,? located to the right of the main door of the building. Enter the main doors facing the parking lot and take the elevator or stairs to the 3rd Floor.  ?[facility]:  ?336-832-9700 or  ?1-800-711-2635 (24/hour helpline)  ?[address]  ?Call to make appointment, tends to be a long wait to begin services, depending on insurance  ?[facility]  ?(336) 333-6860 ??  ?*Call to schedule an appointment  ?[address]  ?Monday-Friday, 8:00am-5:00pm  ?[facility]  ?(336) [phone]  ?[address], [place]  ?Monday-Friday, walk-in 8am-3pm  ?First appointment is assessment, then will make appointment for psychiatry   ?  ?

## 2018-04-19 NOTE — Progress Notes (Signed)
Assessment & Plan:  Wesley [name] was seen today for hospitalization follow-up.  Diagnoses and all orders for this visit:  Dizziness -     Ambulatory referral to ENT Neuro exam in negative  Prediabetes -     Glucose (CBG) Continue blood sugar control as discussed in office today, low carbohydrate diet, and regular physical exercise as tolerated, 150 minutes per week (30 min each day, 5 days per week, or 50 min 3 days per week).   Essential hypertension -     lisinopril (PRINIVIL,ZESTRIL) 5 MG tablet; Take 1 tablet (5 mg total) by mouth daily. Continue all antihypertensives as prescribed.  Remember to bring in your blood pressure log with you for your follow up appointment.  DASH/Mediterranean Diets are healthier choices for HTN.   Patient has been counseled on age-appropriate routine health concerns for screening and prevention. These are reviewed and up-to-date. Referrals have been placed accordingly. Immunizations are up-to-date or declined.    Subjective:   Chief Complaint  Patient presents with  . Hospitalization Follow-up    Pt. is here for HFU.    HPI Wesley [name] 52 y.o. male presents to office today for hospital follow up. He is accompanied by his wife and another male who states she is here on behalf of Walker and is concerned about his mental health. His wife abruptly leaves the room upon me entering and Jaman's  male friend begins to speak for him. She is requesting benzodiazepines for his anxiety. I have instructed her that Saron can speak for himself as he always does and I do not prescribe benzodiazepines. When I ask Keene if he would like to discuss anxiety or depression he states no and that he does not want to take any medications for anxiety.   Dizziness He continues to over utilize the ED with complaints of Dizziness. Workups have been negative. The dizziness has been present for several months. The patient describes the symptoms as vertigo. Symptoms are exacerbated by  motion  Patient denies aural pressure otalgia otorrhea tinnitus hearing loss.  He has been treated with meclizine (Antivert) with poor improvement.    Prediabetes He denies any hypo or hyperglycemic symptoms. Prediabetes is diet controlled at this time. He is due for eye exam but is uninsured. Patient has been advised to apply for financial assistance and schedule to see our financial counselor.  Lab Results  Component Value Date   HGBA1C 6.2 (A) 02/22/2018   CHRONIC HYPERTENSION Disease Monitoring  Blood pressure range: not well controlled today. He did not take his blood pressure medication today.  BP Readings from Last 3 Encounters:  04/27/18 (!) 140/96  04/24/18 133/79  04/22/18 125/85   Chest pain: no   Dyspnea: no   Claudication: no  Medication compliance: yes, lisinopril 5 mg daily Medication Side Effects  Lightheadedness: yes   Urinary frequency: no   Edema: no   Impotence: no  Preventitive Healthcare:  Exercise: no   Diet Pattern: diet: general  Salt Restriction:  no  Review of Systems  Constitutional: Negative for fever, malaise/fatigue and weight loss.  HENT: Negative.  Negative for nosebleeds.   Eyes: Negative.  Negative for blurred vision, double vision and photophobia.  Respiratory: Negative.  Negative for cough and shortness of breath.   Cardiovascular: Negative.  Negative for chest pain, palpitations and leg swelling.  Gastrointestinal: Negative.  Negative for heartburn, nausea and vomiting.  Musculoskeletal: Negative.  Negative for myalgias.  Neurological: Positive for dizziness. Negative for focal weakness, seizures  and headaches.  Psychiatric/Behavioral: Negative.  Negative for suicidal ideas.    Past Medical History:  Diagnosis Date  . Alcohol abuse   . Anxiety   . Anxiety   . Chronic neck pain   . Cocaine abuse (HCC)   . Hypertension   . Prediabetes     Past Surgical History:  Procedure Laterality Date  . EYE SURGERY Right ~ 1980   "hit  my eye"    Family History  Problem Relation Age of Onset  . Hypertension Mother   . Hypertension Father     Social History Reviewed with no changes to be made today.   Outpatient Medications Prior to Visit  Medication Sig Dispense Refill  . amLODipine (NORVASC) 10 MG tablet Take 1 tablet (10 mg total) by mouth daily. 90 tablet 1  . atorvastatin (LIPITOR) 20 MG tablet Take 1 tablet (20 mg total) by mouth daily. 90 tablet 3  . triamcinolone cream (KENALOG) 0.1 % Apply 1 application topically 2 (two) times daily. Use for 1-2 weeks then as needed. 30 g 1  . meclizine (ANTIVERT) 25 MG tablet Take 1 tablet (25 mg total) by mouth 3 (three) times daily as needed for dizziness. 30 tablet 0  . promethazine (PHENERGAN) 25 MG tablet Take 1 tablet (25 mg total) by mouth every 8 (eight) hours as needed for nausea or vomiting. 20 tablet 0  . acetaminophen (TYLENOL) 500 MG tablet Take 1,000 mg by mouth daily as needed (back pain).    . cyclobenzaprine (FLEXERIL) 10 MG tablet Take 1 tablet (10 mg total) by mouth 3 (three) times daily as needed for muscle spasms. (Patient not taking: Reported on 04/19/2018) 60 tablet 0  . fluticasone (FLONASE) 50 MCG/ACT nasal spray Place 1 spray into both nostrils daily. (Patient not taking: Reported on 04/19/2018) 16 g 2  . lisinopril (PRINIVIL,ZESTRIL) 5 MG tablet Take 1 tablet (5 mg total) by mouth daily. (Patient not taking: Reported on 04/19/2018) 90 tablet 3  . LORazepam (ATIVAN) 0.5 MG tablet Take 1 tablet (0.5 mg total) by mouth 2 (two) times daily. for anxiety (Patient not taking: Reported on 03/24/2018) 60 tablet 1  . metFORMIN (GLUCOPHAGE) 500 MG tablet Take 1 tablet (500 mg total) by mouth daily with supper. (Patient not taking: Reported on 04/19/2018) 90 tablet 3   No facility-administered medications prior to visit.     Allergies  Allergen Reactions  . Ibuprofen Other (See Comments) and Rash    Makes his throat get "very dry" after taking it States he cant take  it  . Other Other (See Comments)    Steroids: Patient received a steroid shot from [facility] (336) [phone] and it resulted in his muscles twitching all over his body  . Zofran [Ondansetron Hcl] Other (See Comments)    Causes sweating and body "feels badly"   BP Readings from Last 3 Encounters:  04/27/18 (!) 140/96  04/24/18 133/79  04/22/18 125/85      Objective:    BP 123/85 (BP Location: Left Arm, Patient Position: Sitting, Cuff Size: Normal)   Pulse 91   Temp 98.4 F (36.9 C) (Oral)   Ht 5\' 8"  (1.727 m)   Wt 204 lb 6.4 oz (92.7 kg)   SpO2 95%   BMI 31.08 kg/m  Wt Readings from Last 3 Encounters:  04/27/18 200 lb (90.7 kg)  04/19/18 204 lb 6.4 oz (92.7 kg)  04/11/18 200 lb (90.7 kg)    Physical Exam  Constitutional: He is  oriented to person, place, and time. He appears well-developed and well-nourished. He is cooperative.  HENT:  Head: Normocephalic and atraumatic.  Right Ear: Hearing, tympanic membrane, external ear and ear canal normal.  Left Ear: Hearing, tympanic membrane, external ear and ear canal normal.  Nose: Nose normal. No mucosal edema or rhinorrhea.  Mouth/Throat: Uvula is midline and oropharynx is clear and moist. Abnormal dentition. No oropharyngeal exudate.  Eyes: EOM are normal.  Neck: Normal range of motion.  Cardiovascular: Normal rate, regular rhythm, normal heart sounds and intact distal pulses. Exam reveals no gallop and no friction rub.  No murmur heard. Pulmonary/Chest: Effort normal and breath sounds normal. No tachypnea. No respiratory distress. He has no decreased breath sounds. He has no wheezes. He has no rhonchi. He has no rales. He exhibits no tenderness.  Abdominal: Soft. Bowel sounds are normal.  Musculoskeletal: Normal range of motion. He exhibits no edema.  Neurological: He is alert and oriented to person, place, and time. He displays a negative Romberg sign. Coordination and gait normal.  Skin: Skin is warm and dry.    Psychiatric: He has a normal mood and affect. His behavior is normal. Judgment and thought content normal.  Nursing note and vitals reviewed.        Patient has been counseled extensively about nutrition and exercise as well as the importance of adherence with medications and regular follow-up. The patient was given clear instructions to go to ER or return to medical center if symptoms don't improve, worsen or new problems develop. The patient verbalized understanding.   Follow-up: Return in about 3 weeks (around 05/10/2018) for Wellbutrin F/U .   Claiborne Rigg, FNP-BC [facility] [place], [place] [phone]   04/27/2018, 11:28 PM

## 2018-04-21 ENCOUNTER — Encounter (HOSPITAL_COMMUNITY): Payer: Self-pay | Admitting: *Deleted

## 2018-04-21 ENCOUNTER — Other Ambulatory Visit: Payer: Self-pay

## 2018-04-21 ENCOUNTER — Emergency Department (HOSPITAL_COMMUNITY)
Admission: EM | Admit: 2018-04-21 | Discharge: 2018-04-21 | Disposition: A | Discharge status: Home / Self Care | Payer: Self-pay | Attending: Emergency Medicine | Admitting: Emergency Medicine

## 2018-04-21 DIAGNOSIS — Z5321 Procedure and treatment not carried out due to patient leaving prior to being seen by health care provider: Secondary | ICD-10-CM | POA: Insufficient documentation

## 2018-04-21 DIAGNOSIS — R030 Elevated blood-pressure reading, without diagnosis of hypertension: Secondary | ICD-10-CM | POA: Insufficient documentation

## 2018-04-21 NOTE — ED Notes (Signed)
Pt brought back to hallway bed by NT, but pt walked out prior to being seen by EDP. No notice given to any staff member of pt's desire or intent to leave without being seen. Checked restrooms and other unsecured areas for pt, though without success. Charge RN aware.

## 2018-04-21 NOTE — ED Triage Notes (Signed)
The pt is c/o his bp is very high for one hour  He was feeling hot all over  No pain

## 2018-04-22 ENCOUNTER — Encounter (HOSPITAL_COMMUNITY): Payer: Self-pay | Admitting: Emergency Medicine

## 2018-04-22 ENCOUNTER — Ambulatory Visit (HOSPITAL_COMMUNITY)
Admission: EM | Admit: 2018-04-22 | Discharge: 2018-04-22 | Disposition: A | Discharge status: Home / Self Care | Payer: Self-pay | Attending: Family Medicine | Admitting: Family Medicine

## 2018-04-22 DIAGNOSIS — R42 Dizziness and giddiness: Secondary | ICD-10-CM

## 2018-04-22 MED ORDER — PROMETHAZINE HCL 25 MG PO TABS: 25.0000 mg | ORAL_TABLET | Freq: Three times a day (TID) | ORAL | 0 refills | Status: DC | PRN

## 2018-04-22 NOTE — Discharge Instructions (Signed)
Take all medicine as prescribed I have refilled the phenergan for nausea Follow up with regular visits to your PCP

## 2018-04-22 NOTE — ED Notes (Signed)
Pt refused blood draw

## 2018-04-22 NOTE — ED Notes (Addendum)
Pt left before discharge instructions

## 2018-04-22 NOTE — ED Provider Notes (Signed)
[facility]    CSN: [id number] Arrival date & time: 04/22/18  1140     History   Chief Complaint Chief Complaint  Patient presents with  . Dizziness    HPI Wesley [name] is a 52 y.o. male.   HPI  Wesley [name] is here with a chief complaint of dizziness.  I reviewed his medical record.  I reviewed his active care plan.  This is my first visit with Wesley [name].  He has multiple, frequent visits at the emergency room, urgent care center, and his primary care doctor for the same complaint.  He feels that no one knows why he is dizzy.  He states he continues to seek medical care for dizziness until someone can tell him why it is happening.  He has been referred to neurology.  He no showed for the neurology appointments.  I reminded him today that a neurologist would be an appropriate specialist to see for dizziness, but he would have to cooperate with visits and testing. He is here today saying he is dizzy.  He has nausea.  He took his last promethazine tablet this morning.  He is here to get a refill because he feels that he needs to keep this on hand.  No chest pain or shortness of breath.  He has not had any falls.  No fever or chills.  No respiratory complaints, runny stuffy nose, ear pain, hearing loss, or allergies. He is compliant with his prescription medication. His blood pressure is good. No change in behavior, no change in cognition, no head injury.  No numbness or weakness of extremities. He describes his feeling as a lightheadedness.  He briefly has a decrease in vision.  He feels like he will faint if he does not sit down.  He does not describe spinning or change in balance.  It is not worsened with position.  He states he felt dizzy when he first got up this morning.  The feeling is gone away and now he feels normal. Initially he requested to have a sugar checked.  He refused blood work.  I changed it to a fingerstick, however, he left prior to testing being  done   Past Medical History:  Diagnosis Date  . Alcohol abuse   . Anxiety   . Anxiety   . Chronic neck pain   . Cocaine abuse (HCC)   . Hypertension   . Prediabetes     Patient Active Problem List   Diagnosis Date Noted  . 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 Prediabetes 04/23/2017  . Muscle spasm 04/23/2017  . Essential hypertension 04/16/2017  . H/O medication noncompliance 04/02/2017  . Vitamin D deficiency 04/02/2017  . DJD (degenerative joint disease) of cervical spine 03/16/2017  . 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 Dizziness 01/23/2017  . Anxiety 11/21/2016    Past Surgical History:  Procedure Laterality Date  . EYE SURGERY Right ~ 1980   "hit my eye"       Home Medications    Prior to Admission medications   Medication Sig Start Date End Date Taking? Authorizing Provider  acetaminophen (TYLENOL) 500 MG tablet Take 1,000 mg by mouth daily as needed (back pain).    [provider]  amLODipine (NORVASC) 10 MG tablet Take 1 tablet (10 mg total) by mouth daily. 02/22/18 05/23/18  Claiborne RiggFleming, Zelda W, NP  atorvastatin (LIPITOR) 20 MG tablet Take 1 tablet (20 mg total) by mouth daily. 02/26/18   Meredeth IdeFleming,  Shea StakesZelda W, NP  lisinopril (PRINIVIL,ZESTRIL) 5 MG tablet Take 1 tablet (5 mg total) by mouth daily. 04/19/18   Claiborne RiggFleming, Zelda W, NP  meclizine (ANTIVERT) 25 MG tablet Take 1 tablet (25 mg total) by mouth 3 (three) times daily as needed for dizziness. 04/19/18   Claiborne RiggFleming, Zelda W, NP  promethazine (PHENERGAN) 25 MG tablet Take 1 tablet (25 mg total) by mouth every 8 (eight) hours as needed for nausea or vomiting. 04/22/18   Eustace MooreNelson, Yvonne Sue, MD  triamcinolone cream (KENALOG) 0.1 % Apply 1 application topically 2 (two) times daily. Use for 1-2 weeks then as needed. 04/17/18   Mardella LaymanHagler, Brian, MD    Family History Family History  Problem Relation Age of Onset  . Hypertension Mother   . Hypertension Father     Social History Social History   Tobacco Use   . Smoking status: Former Games developermoker  . Smokeless tobacco: Never Used  Substance Use Topics  . Alcohol use: No    Comment: former  . Drug use: No    Comment: denies, reports past history      Allergies   Ibuprofen; Other; and Zofran [ondansetron hcl]   Review of Systems Review of Systems  Constitutional: Negative for chills and fever.  HENT: Negative for ear pain and sore throat.   Eyes: Negative for pain and visual disturbance.  Respiratory: Negative for cough and shortness of breath.   Cardiovascular: Negative for chest pain and palpitations.  Gastrointestinal: Positive for nausea. Negative for abdominal pain and vomiting.  Genitourinary: Negative for dysuria and hematuria.  Musculoskeletal: Negative for arthralgias and back pain.  Skin: Negative for color change and rash.  Neurological: Positive for light-headedness. Negative for seizures and syncope.  All other systems reviewed and are negative.    Physical Exam Triage Vital Signs ED Triage Vitals [04/22/18 1214]  Enc Vitals Group     BP 125/85     Pulse Rate 97     Resp 20     Temp 98.1 F (36.7 C)     Temp Source Oral     SpO2 97 %     Weight      Height      Head Circumference      Peak Flow      Pain Score 0     Pain Loc      Pain Edu?      Excl. in GC?    No data found.  Updated Vital Signs BP 125/85   Pulse 97   Temp 98.1 F (36.7 C) (Oral)   Resp 20   SpO2 97%   Visual Acuity Right Eye Distance:   Left Eye Distance:   Bilateral Distance:    Right Eye Near:   Left Eye Near:    Bilateral Near:     Physical Exam  Constitutional: He appears well-developed and well-nourished. No distress.  HENT:  Head: Normocephalic and atraumatic.  Right Ear: External ear normal.  Left Ear: External ear normal.  Nose: Nose normal.  Mouth/Throat: Oropharynx is clear and moist.  Many absent teeth.  Eyes: Pupils are equal, round, and reactive to light. Conjunctivae and EOM are normal.  Disks are flat   Neck: Normal range of motion.  No JVD, no bruit  Cardiovascular: Normal rate, regular rhythm and normal heart sounds.  Pulmonary/Chest: Effort normal and breath sounds normal. No respiratory distress. He has no wheezes.  Abdominal: Soft. He exhibits no distension.  Musculoskeletal: Normal range of motion. He exhibits  no edema.  Lymphadenopathy:    He has no cervical adenopathy.  Neurological: He is alert. He displays normal reflexes. No cranial nerve deficit or sensory deficit. He exhibits normal muscle tone. Coordination normal.  Skin: Skin is warm and dry.  Psychiatric: His behavior is normal.  Moderately anxious     UC Treatments / Results  Labs (all labs ordered are listed, but only abnormal results are displayed) Labs Reviewed - No data to display  EKG None  Radiology No results found.  Procedures Procedures (including critical care time)  Medications Ordered in UC Medications - No data to display  Initial Impression / Assessment and Plan / UC Course  I have reviewed the triage vital signs and the nursing notes.  Pertinent labs & imaging results that were available during my care of the patient were reviewed by me and considered in my medical decision making (see chart for details).     Patient left prior to completing a blood glucose.  I printed discharge instructions for him to follow-up with his PCP.  I did refill his Phenergan for as needed use.  I discussed with him during the visit the need to see a neurologist if he continues to have dizzy spells. Final Clinical Impressions(s) / UC Diagnoses   Final diagnoses:  Dizziness  Lightheadedness     Discharge Instructions     Take all medicine as prescribed I have refilled the phenergan for nausea Follow up with regular visits to your PCP    ED Prescriptions    Medication Sig Dispense Auth. Provider   promethazine (PHENERGAN) 25 MG tablet Take 1 tablet (25 mg total) by mouth every 8 (eight) hours as  needed for nausea or vomiting. 30 tablet Eustace Moore, MD     Controlled Substance Prescriptions [place] Controlled Substance Registry consulted? Not Applicable   Eustace Moore, MD 04/22/18 1432

## 2018-04-22 NOTE — ED Triage Notes (Signed)
Pt here for dizziness; pt seen multiple times in past f or same; pt appears anxious

## 2018-04-24 ENCOUNTER — Ambulatory Visit (HOSPITAL_COMMUNITY)
Admission: EM | Admit: 2018-04-24 | Discharge: 2018-04-24 | Disposition: A | Discharge status: Home / Self Care | Payer: Self-pay | Attending: Family Medicine | Admitting: Family Medicine

## 2018-04-24 ENCOUNTER — Encounter (HOSPITAL_COMMUNITY): Payer: Self-pay | Admitting: Emergency Medicine

## 2018-04-24 DIAGNOSIS — R42 Dizziness and giddiness: Secondary | ICD-10-CM

## 2018-04-24 DIAGNOSIS — H9202 Otalgia, left ear: Secondary | ICD-10-CM

## 2018-04-24 MED ORDER — MECLIZINE HCL 25 MG PO TABS: 25.0000 mg | ORAL_TABLET | Freq: Three times a day (TID) | ORAL | 0 refills | Status: DC | PRN

## 2018-04-24 NOTE — ED Provider Notes (Signed)
[facility]    CSN: [id number] Arrival date & time: 04/24/18  [id number]     History   Chief Complaint Chief Complaint  Patient presents with  . Otalgia    left  . Dizziness    HPI Johnnette LitterJose Sabol is a 52 y.o. male.   Patient complains of some fullness in his left ear along with dizziness that seems to be positional.  There is also a history of hypertension but that is well controlled and should not be contributing to the dizziness.  HPI  Past Medical History:  Diagnosis Date  . Alcohol abuse   . Anxiety   . Anxiety   . Chronic neck pain   . Cocaine abuse (HCC)   . Hypertension   . Prediabetes     Patient Active Problem List   Diagnosis Date Noted  . 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 Prediabetes 04/23/2017  . Muscle spasm 04/23/2017  . Essential hypertension 04/16/2017  . H/O medication noncompliance 04/02/2017  . Vitamin D deficiency 04/02/2017  . DJD (degenerative joint disease) of cervical spine 03/16/2017  . 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 Dizziness 01/23/2017  . Anxiety 11/21/2016    Past Surgical History:  Procedure Laterality Date  . EYE SURGERY Right ~ 1980   "hit my eye"       Home Medications    Prior to Admission medications   Medication Sig Start Date End Date Taking? Authorizing Provider  acetaminophen (TYLENOL) 500 MG tablet Take 1,000 mg by mouth daily as needed (back pain).    [provider]  amLODipine (NORVASC) 10 MG tablet Take 1 tablet (10 mg total) by mouth daily. 02/22/18 05/23/18  Claiborne RiggFleming, Zelda W, NP  atorvastatin (LIPITOR) 20 MG tablet Take 1 tablet (20 mg total) by mouth daily. 02/26/18   Claiborne RiggFleming, Zelda W, NP  lisinopril (PRINIVIL,ZESTRIL) 5 MG tablet Take 1 tablet (5 mg total) by mouth daily. 04/19/18   Claiborne RiggFleming, Zelda W, NP  meclizine (ANTIVERT) 25 MG tablet Take 1 tablet (25 mg total) by mouth 3 (three) times daily as needed for dizziness. 04/19/18   Claiborne RiggFleming, Zelda W, NP  promethazine (PHENERGAN) 25  MG tablet Take 1 tablet (25 mg total) by mouth every 8 (eight) hours as needed for nausea or vomiting. 04/22/18   Eustace MooreNelson, Yvonne Sue, MD  triamcinolone cream (KENALOG) 0.1 % Apply 1 application topically 2 (two) times daily. Use for 1-2 weeks then as needed. 04/17/18   Mardella LaymanHagler, Brian, MD    Family History Family History  Problem Relation Age of Onset  . Hypertension Mother   . Hypertension Father     Social History Social History   Tobacco Use  . Smoking status: Former Games developermoker  . Smokeless tobacco: Never Used  Substance Use Topics  . Alcohol use: No    Comment: former  . Drug use: No    Comment: denies, reports past history      Allergies   Ibuprofen; Other; and Zofran [ondansetron hcl]   Review of Systems Review of Systems  Constitutional: Negative.   HENT: Positive for ear pain.   Neurological: Positive for dizziness.     Physical Exam Triage Vital Signs ED Triage Vitals [04/24/18 1003]  Enc Vitals Group     BP 133/79     Pulse Rate 89     Resp 16     Temp 98.2 F (36.8 C)     Temp Source Oral     SpO2 100 %  Weight      Height      Head Circumference      Peak Flow      Pain Score      Pain Loc      Pain Edu?      Excl. in GC?    No data found.  Updated Vital Signs BP 133/79 (BP Location: Left Arm)   Pulse 89   Temp 98.2 F (36.8 C) (Oral)   Resp 16   SpO2 100%   Visual Acuity Right Eye Distance:   Left Eye Distance:   Bilateral Distance:    Right Eye Near:   Left Eye Near:    Bilateral Near:     Physical Exam  Constitutional: He appears well-developed and well-nourished.  HENT:  Right Ear: External ear normal.  Left Ear: External ear normal.  Cardiovascular: Normal rate and regular rhythm.  Pulmonary/Chest: Effort normal and breath sounds normal.     UC Treatments / Results  Labs (all labs ordered are listed, but only abnormal results are displayed) Labs Reviewed - No data to display  EKG None  Radiology No results  found.  Procedures Procedures (including critical care time)  Medications Ordered in UC Medications - No data to display  Initial Impression / Assessment and Plan / UC Course  I have reviewed the triage vital signs and the nursing notes.  Pertinent labs & imaging results that were available during my care of the patient were reviewed by me and considered in my medical decision making (see chart for details).     Probable inner ear infection.  I see no fluid and there is normal movement of the tympanic membrane with Valsalva maneuver.  Will treat symptoms with meclizine Final Clinical Impressions(s) / UC Diagnoses   Final diagnoses:  None   Discharge Instructions   None    ED Prescriptions    None     Controlled Substance Prescriptions [place] Controlled Substance Registry consulted? No   Frederica KusterMiller, Shalawn Wynder M, MD 04/24/18 1029

## 2018-04-24 NOTE — ED Triage Notes (Signed)
Pt complains of left internal ear pain x3 hours and chronic dizziness.

## 2018-04-27 ENCOUNTER — Emergency Department (HOSPITAL_COMMUNITY)
Admission: EM | Admit: 2018-04-27 | Discharge: 2018-04-28 | Disposition: A | Discharge status: Home / Self Care | Payer: Self-pay | Attending: Emergency Medicine | Admitting: Emergency Medicine

## 2018-04-27 ENCOUNTER — Other Ambulatory Visit: Payer: Self-pay

## 2018-04-27 ENCOUNTER — Encounter: Payer: Self-pay | Admitting: Nurse Practitioner

## 2018-04-27 DIAGNOSIS — Z5321 Procedure and treatment not carried out due to patient leaving prior to being seen by health care provider: Secondary | ICD-10-CM | POA: Insufficient documentation

## 2018-04-27 DIAGNOSIS — R030 Elevated blood-pressure reading, without diagnosis of hypertension: Secondary | ICD-10-CM | POA: Insufficient documentation

## 2018-04-27 NOTE — ED Triage Notes (Signed)
Pt stated he was feeling different today and noticed his blood pressure was high. He stated he has two different blood pressure meds and only took one of them. He is alert oriented with no chest pain, no sob.

## 2018-04-28 NOTE — ED Notes (Signed)
No answer when called for room x 2 

## 2018-04-28 NOTE — ED Provider Notes (Signed)
I did not see or evaluate the patient.  The patient left the emergency department prior to my evaluation.   Azalia Bilisampos, Kevin, MD 04/28/18 [phone]

## 2018-05-04 ENCOUNTER — Encounter (HOSPITAL_COMMUNITY): Payer: Self-pay | Admitting: *Deleted

## 2018-05-04 ENCOUNTER — Ambulatory Visit (HOSPITAL_COMMUNITY)
Admission: EM | Admit: 2018-05-04 | Discharge: 2018-05-04 | Disposition: A | Discharge status: Home / Self Care | Payer: Self-pay | Attending: Internal Medicine | Admitting: Internal Medicine

## 2018-05-04 DIAGNOSIS — M62838 Other muscle spasm: Secondary | ICD-10-CM

## 2018-05-04 MED ORDER — ALPRAZOLAM 0.5 MG PO TABS: 0.5000 mg | ORAL_TABLET | Freq: Every evening | ORAL | 0 refills | Status: DC | PRN

## 2018-05-04 MED ORDER — CYCLOBENZAPRINE HCL 10 MG PO TABS: 10.0000 mg | ORAL_TABLET | Freq: Two times a day (BID) | ORAL | 0 refills | Status: DC | PRN

## 2018-05-04 NOTE — ED Triage Notes (Signed)
States BP = 160/110 just PTA.  States has been taking HTN meds QD in afternoon.  Has had some neck and shoulder spasms tonight.

## 2018-05-04 NOTE — ED Provider Notes (Signed)
[facility]    CSN: [id number] Arrival date & time: 05/04/18  1711     History   Chief Complaint Chief Complaint  Patient presents with  . Hypertension    HPI Wesley [name] is a 52 y.o. male.   52y.o. male presents with muscle spasms to upper back and neck x1 day.  Patient states initially he thought it was related to his blood pressure and took his blood pressure medication.  Condition is acutein nature. Condition is made better by nothing condition is made worse by nothing. Patient denies any treatment prior to there arrival at this facility.  Patient since his symptoms have resolved upon arrival patient states that he has anxiety and insomnia.  Patient is requesting a refill on Xanax at this time.       Past Medical History:  Diagnosis Date  . Alcohol abuse   . Anxiety   . Anxiety   . Chronic neck pain   . Cocaine abuse (HCC)   . Hypertension   . Prediabetes     Patient Active Problem List   Diagnosis Date Noted  . Legally blind in right eye, as defined in [place] 05/02/2017  . Prediabetes 04/23/2017  . Muscle spasm 04/23/2017  . Essential hypertension 04/16/2017  . H/O medication noncompliance 04/02/2017  . Vitamin D deficiency 04/02/2017  . DJD (degenerative joint disease) of cervical spine 03/16/2017  . 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 Dizziness 01/23/2017  . Anxiety 11/21/2016    Past Surgical History:  Procedure Laterality Date  . EYE SURGERY Right ~ 1980   "hit my eye"       Home Medications    Prior to Admission medications   Medication Sig Start Date End Date Taking? Authorizing Provider  amLODipine (NORVASC) 10 MG tablet Take 1 tablet (10 mg total) by mouth daily. 02/22/18 05/23/18 Yes Claiborne Rigg, NP  atorvastatin (LIPITOR) 20 MG tablet Take 1 tablet (20 mg total) by mouth daily. 02/26/18  Yes Claiborne Rigg, NP  LORAZEPAM PO Take by mouth.   Yes [provider]  meclizine (ANTIVERT) 25 MG tablet Take 1 tablet (25 mg total) by  mouth 3 (three) times daily as needed for dizziness. 04/24/18  Yes Frederica Kuster, MD  promethazine (PHENERGAN) 25 MG tablet Take 1 tablet (25 mg total) by mouth every 8 (eight) hours as needed for nausea or vomiting. 04/22/18  Yes Eustace Moore, MD  acetaminophen (TYLENOL) 500 MG tablet Take 1,000 mg by mouth daily as needed (back pain).    [provider]  lisinopril (PRINIVIL,ZESTRIL) 5 MG tablet Take 1 tablet (5 mg total) by mouth daily. 04/19/18   Claiborne Rigg, NP  triamcinolone cream (KENALOG) 0.1 % Apply 1 application topically 2 (two) times daily. Use for 1-2 weeks then as needed. 04/17/18   Mardella Layman, MD    Family History Family History  Problem Relation Age of Onset  . Hypertension Mother   . Hypertension Father     Social History Social History   Tobacco Use  . Smoking status: Former Games developer  . Smokeless tobacco: Never Used  Substance Use Topics  . Alcohol use: No    Comment: former  . Drug use: Not Currently    Comment: denies, reports past history      Allergies   Ibuprofen; Other; and Zofran [ondansetron hcl]   Review of Systems Review of Systems  Constitutional: Negative for chills and fever.  HENT: Negative for ear pain and sore throat.  Eyes: Negative for pain and visual disturbance.  Respiratory: Negative for cough and shortness of breath.   Cardiovascular: Negative for chest pain and palpitations.  Gastrointestinal: Negative for abdominal pain and vomiting.  Genitourinary: Negative for dysuria and hematuria.  Musculoskeletal: Negative for arthralgias.       Spasm to neck   Skin: Negative for color change and rash.  Neurological: Negative for seizures and syncope.  All other systems reviewed and are negative.    Physical Exam Triage Vital Signs ED Triage Vitals  Enc Vitals Group     BP 05/04/18 1830 126/81     Pulse Rate 05/04/18 1830 (!) 16     Resp 05/04/18 1830 16     Temp 05/04/18 1830 98.4 F (36.9 C)     Temp Source  05/04/18 1830 Oral     SpO2 05/04/18 1830 99 %     Weight --      Height --      Head Circumference --      Peak Flow --      Pain Score 05/04/18 1844 7     Pain Loc --      Pain Edu? --      Excl. in GC? --    No data found.  Updated Vital Signs BP 126/81 (BP Location: Left Arm)   Pulse (!) 16   Temp 98.4 F (36.9 C) (Oral)   Resp 16   SpO2 99%       Physical Exam  Constitutional: He is oriented to person, place, and time. He appears well-developed and well-nourished.  HENT:  Head: Normocephalic.  Neck: Normal range of motion.  Pulmonary/Chest: Effort normal.  Musculoskeletal: Normal range of motion.  Neurological: He is alert and oriented to person, place, and time.  Skin: Skin is dry.  Psychiatric: He has a normal mood and affect.  Nursing note and vitals reviewed.    UC Treatments / Results  Labs (all labs ordered are listed, but only abnormal results are displayed) Labs Reviewed - No data to display  EKG None  Radiology No results found.  Procedures Procedures (including critical care time)  Medications Ordered in UC Medications - No data to display  Initial Impression / Assessment and Plan / UC Course  I have reviewed the triage vital signs and the nursing notes.  Pertinent labs & imaging results that were available during my care of the patient were reviewed by me and considered in my medical decision making (see chart for details).      Final Clinical Impressions(s) / UC Diagnoses   Final diagnoses:  None   Discharge Instructions   None    ED Prescriptions    None     Controlled Substance Prescriptions [place] Controlled Substance Registry consulted? Not Applicable   Alene MiresOmohundro, Jennifer C, NP 05/04/18 [phone]

## 2018-05-13 ENCOUNTER — Other Ambulatory Visit: Payer: Self-pay

## 2018-05-13 ENCOUNTER — Encounter (HOSPITAL_COMMUNITY): Payer: Self-pay | Admitting: Emergency Medicine

## 2018-05-13 ENCOUNTER — Ambulatory Visit (HOSPITAL_COMMUNITY)
Admission: EM | Admit: 2018-05-13 | Discharge: 2018-05-13 | Disposition: A | Discharge status: Home / Self Care | Payer: Self-pay | Attending: Family Medicine | Admitting: Family Medicine

## 2018-05-13 DIAGNOSIS — R42 Dizziness and giddiness: Secondary | ICD-10-CM

## 2018-05-13 MED ORDER — LORAZEPAM 0.5 MG PO TABS: 0.5000 mg | ORAL_TABLET | Freq: Every day | ORAL | 0 refills | Status: DC

## 2018-05-13 NOTE — ED Triage Notes (Signed)
Patient is having recurrent episodes of dizziness.  Patient has a history of the same.

## 2018-05-13 NOTE — ED Provider Notes (Signed)
[facility]    CSN: [id number] Arrival date & time: 05/13/18  1856     History   Chief Complaint Chief Complaint  Patient presents with  . Dizziness    HPI Wesley [name] is a 52 y.o. male.   Patient complains of lightheadedness.  He denies headache or palpitations.  Has been seen for this problem before.  He takes blood pressure pills but pressure seems to be controlled today.  He brings in an empty bottle of lorazepam which he uses for anxiety.  He feels like anxiety relates to his dizziness.  HPI  Past Medical History:  Diagnosis Date  . Alcohol abuse   . Anxiety   . Anxiety   . Chronic neck pain   . Cocaine abuse (HCC)   . Hypertension   . Prediabetes     Patient Active Problem List   Diagnosis Date Noted  . Legally blind in right eye, as defined in [place] 05/02/2017  . Prediabetes 04/23/2017  . Muscle spasm 04/23/2017  . Essential hypertension 04/16/2017  . H/O medication noncompliance 04/02/2017  . Vitamin D deficiency 04/02/2017  . DJD (degenerative joint disease) of cervical spine 03/16/2017  . 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 Dizziness 01/23/2017  . Anxiety 11/21/2016    Past Surgical History:  Procedure Laterality Date  . EYE SURGERY Right ~ 1980   "hit my eye"       Home Medications    Prior to Admission medications   Medication Sig Start Date End Date Taking? Authorizing Provider  acetaminophen (TYLENOL) 500 MG tablet Take 1,000 mg by mouth daily as needed (back pain).    [provider]  ALPRAZolam Prudy Feeler) 0.5 MG tablet Take 1 tablet (0.5 mg total) by mouth at bedtime as needed for anxiety. 05/04/18   Alene Mires, NP  amLODipine (NORVASC) 10 MG tablet Take 1 tablet (10 mg total) by mouth daily. 02/22/18 05/23/18  Claiborne Rigg, NP  atorvastatin (LIPITOR) 20 MG tablet Take 1 tablet (20 mg total) by mouth daily. 02/26/18   Claiborne Rigg, NP  cyclobenzaprine (FLEXERIL) 10 MG tablet Take 1 tablet (10 mg total) by mouth 2 (two)  times daily as needed for muscle spasms. 05/04/18   Alene Mires, NP  lisinopril (PRINIVIL,ZESTRIL) 5 MG tablet Take 1 tablet (5 mg total) by mouth daily. 04/19/18   Claiborne Rigg, NP  LORAZEPAM PO Take by mouth.    [provider]  meclizine (ANTIVERT) 25 MG tablet Take 1 tablet (25 mg total) by mouth 3 (three) times daily as needed for dizziness. 04/24/18   Frederica Kuster, MD  promethazine (PHENERGAN) 25 MG tablet Take 1 tablet (25 mg total) by mouth every 8 (eight) hours as needed for nausea or vomiting. 04/22/18   Eustace Moore, MD  triamcinolone cream (KENALOG) 0.1 % Apply 1 application topically 2 (two) times daily. Use for 1-2 weeks then as needed. 04/17/18   Mardella Layman, MD    Family History Family History  Problem Relation Age of Onset  . Hypertension Mother   . Hypertension Father     Social History Social History   Tobacco Use  . Smoking status: Former Games developer  . Smokeless tobacco: Never Used  Substance Use Topics  . Alcohol use: No    Comment: former  . Drug use: Not Currently    Comment: denies, reports past history      Allergies   Ibuprofen; Other; and Zofran [ondansetron hcl]   Review of Systems  Review of Systems  Constitutional: Negative for chills and fever.  HENT: Negative for ear pain and sore throat.   Eyes: Negative for pain and visual disturbance.  Respiratory: Negative for cough and shortness of breath.   Cardiovascular: Negative for chest pain and palpitations.  Gastrointestinal: Negative for abdominal pain and vomiting.  Genitourinary: Negative for dysuria and hematuria.  Musculoskeletal: Negative for arthralgias and back pain.  Skin: Negative for color change and rash.  Neurological: Positive for dizziness. Negative for seizures and syncope.  All other systems reviewed and are negative.    Physical Exam Triage Vital Signs ED Triage Vitals  Enc Vitals Group     BP 05/13/18 1956 138/81     Pulse Rate 05/13/18 1956  77     Resp 05/13/18 1956 18     Temp 05/13/18 1956 97.6 F (36.4 C)     Temp Source 05/13/18 1956 Oral     SpO2 05/13/18 1956 97 %     Weight --      Height --      Head Circumference --      Peak Flow --      Pain Score 05/13/18 1954 8     Pain Loc --      Pain Edu? --      Excl. in GC? --    No data found.  Updated Vital Signs BP 138/81 (BP Location: Right Arm)   Pulse 77   Temp 97.6 F (36.4 C) (Oral)   Resp 18   SpO2 97%   Visual Acuity Right Eye Distance:   Left Eye Distance:   Bilateral Distance:    Right Eye Near:   Left Eye Near:    Bilateral Near:     Physical Exam  Constitutional: He is oriented to person, place, and time. He appears well-developed and well-nourished.  Cardiovascular: Normal rate, regular rhythm and normal heart sounds.  Neurological: He is alert and oriented to person, place, and time.  Romberg negative Normal tandem gait walking.     UC Treatments / Results  Labs (all labs ordered are listed, but only abnormal results are displayed) Labs Reviewed - No data to display  EKG None  Radiology No results found.  Procedures Procedures (including critical care time)  Medications Ordered in UC Medications - No data to display  Initial Impression / Assessment and Plan / UC Course  I have reviewed the triage vital signs and the nursing notes.  Pertinent labs & imaging results that were available during my care of the patient were reviewed by me and considered in my medical decision making (see chart for details).     Dizziness and anxiety.  We will refill prescription for lorazepam No. 12 to be used as needed continue blood pressure medicine as before and meclizine as before Final Clinical Impressions(s) / UC Diagnoses   Final diagnoses:  None   Discharge Instructions   None    ED Prescriptions    None     Controlled Substance Prescriptions [place] Controlled Substance Registry consulted? No   Frederica Kuster,  MD 05/13/18 2045

## 2018-05-22 ENCOUNTER — Ambulatory Visit (HOSPITAL_COMMUNITY)
Admission: EM | Admit: 2018-05-22 | Discharge: 2018-05-22 | Disposition: A | Discharge status: Home / Self Care | Payer: Self-pay | Attending: Family Medicine | Admitting: Family Medicine

## 2018-05-22 ENCOUNTER — Encounter (HOSPITAL_COMMUNITY): Payer: Self-pay

## 2018-05-22 ENCOUNTER — Other Ambulatory Visit: Payer: Self-pay

## 2018-05-22 DIAGNOSIS — R110 Nausea: Secondary | ICD-10-CM

## 2018-05-22 DIAGNOSIS — I10 Essential (primary) hypertension: Secondary | ICD-10-CM

## 2018-05-22 LAB — GLUCOSE, CAPILLARY: Glucose-Capillary: 105 mg/dL — ABNORMAL HIGH (ref 70–99)

## 2018-05-22 MED ORDER — LORAZEPAM 0.5 MG PO TABS: 0.5000 mg | ORAL_TABLET | Freq: Three times a day (TID) | ORAL | 0 refills | Status: DC | PRN

## 2018-05-22 NOTE — ED Triage Notes (Signed)
Pt states he been dizzy today it happened when he stand up.

## 2018-05-24 ENCOUNTER — Ambulatory Visit: Payer: Self-pay | Admitting: Nurse Practitioner

## 2018-05-28 ENCOUNTER — Other Ambulatory Visit: Payer: Self-pay

## 2018-05-28 ENCOUNTER — Encounter (HOSPITAL_COMMUNITY): Payer: Self-pay | Admitting: Emergency Medicine

## 2018-05-28 ENCOUNTER — Emergency Department (HOSPITAL_COMMUNITY)
Admission: EM | Admit: 2018-05-28 | Discharge: 2018-05-28 | Disposition: A | Discharge status: Home / Self Care | Payer: Self-pay | Attending: Emergency Medicine | Admitting: Emergency Medicine

## 2018-05-28 DIAGNOSIS — R1013 Epigastric pain: Secondary | ICD-10-CM | POA: Insufficient documentation

## 2018-05-28 DIAGNOSIS — I10 Essential (primary) hypertension: Secondary | ICD-10-CM | POA: Insufficient documentation

## 2018-05-28 DIAGNOSIS — Z79899 Other long term (current) drug therapy: Secondary | ICD-10-CM | POA: Insufficient documentation

## 2018-05-28 DIAGNOSIS — R110 Nausea: Secondary | ICD-10-CM | POA: Insufficient documentation

## 2018-05-28 DIAGNOSIS — Z87891 Personal history of nicotine dependence: Secondary | ICD-10-CM | POA: Insufficient documentation

## 2018-05-28 LAB — CBC
HCT: 42.1 % (ref 39.0–52.0)
Hemoglobin: 13.8 g/dL (ref 13.0–17.0)
MCH: 26.9 pg (ref 26.0–34.0)
MCHC: 32.8 g/dL (ref 30.0–36.0)
MCV: 82.1 fL (ref 78.0–100.0)
Platelets: 287 10*3/uL (ref 150–400)
RBC: 5.13 MIL/uL (ref 4.22–5.81)
RDW: 14 % (ref 11.5–15.5)
WBC: 6.7 10*3/uL (ref 4.0–10.5)

## 2018-05-28 LAB — LIPASE, BLOOD: Lipase: 36 U/L (ref 11–51)

## 2018-05-28 LAB — URINALYSIS, ROUTINE W REFLEX MICROSCOPIC
Bilirubin Urine: NEGATIVE
Glucose, UA: NEGATIVE mg/dL
Hgb urine dipstick: NEGATIVE
Ketones, ur: NEGATIVE mg/dL
Leukocytes, UA: NEGATIVE
Nitrite: NEGATIVE
Protein, ur: NEGATIVE mg/dL
Specific Gravity, Urine: 1.004 — ABNORMAL LOW (ref 1.005–1.030)
pH: 8 (ref 5.0–8.0)

## 2018-05-28 LAB — COMPREHENSIVE METABOLIC PANEL
ALT: 35 U/L (ref 0–44)
AST: 29 U/L (ref 15–41)
Albumin: 4.1 g/dL (ref 3.5–5.0)
Alkaline Phosphatase: 84 U/L (ref 38–126)
Anion gap: 12 (ref 5–15)
BUN: 13 mg/dL (ref 6–20)
CO2: 25 mmol/L (ref 22–32)
Calcium: 9.4 mg/dL (ref 8.9–10.3)
Chloride: 103 mmol/L (ref 98–111)
Creatinine, Ser: 0.73 mg/dL (ref 0.61–1.24)
GFR calc Af Amer: >60 mL/min (ref >60)
GFR calc non Af Amer: >60 mL/min (ref >60)
Glucose, Bld: 106 mg/dL — ABNORMAL HIGH (ref 70–99)
Potassium: 3.7 mmol/L (ref 3.5–5.1)
Sodium: 140 mmol/L (ref 135–145)
Total Bilirubin: 0.5 mg/dL (ref 0.3–1.2)
Total Protein: 8.1 g/dL (ref 6.5–8.1)

## 2018-05-28 LAB — I-STAT TROPONIN, ED: Troponin i, poc: 0 ng/mL (ref 0.00–0.08)

## 2018-05-28 MED ORDER — OMEPRAZOLE 20 MG PO CPDR: 20.0000 mg | DELAYED_RELEASE_CAPSULE | Freq: Every day | ORAL | 0 refills | Status: DC

## 2018-05-28 MED ORDER — PROMETHAZINE HCL 25 MG PO TABS
25.0000 mg | ORAL_TABLET | Freq: Once | ORAL | Status: AC
  Administered 2018-05-28: 25 mg via ORAL
  Filled 2018-05-28: qty 1

## 2018-05-28 MED ORDER — GI COCKTAIL ~~LOC~~
30.0000 mL | Freq: Once | ORAL | Status: AC
  Administered 2018-05-28: 30 mL via ORAL
  Filled 2018-05-28: qty 30

## 2018-05-28 MED ORDER — SUCRALFATE 1 GM/10ML PO SUSP: 1.0000 g | Freq: Three times a day (TID) | ORAL | 0 refills | Status: DC

## 2018-05-28 NOTE — ED Triage Notes (Signed)
Pt reports having nausea that started last night.

## 2018-05-28 NOTE — ED Notes (Signed)
ED Provider at bedside. 

## 2018-05-28 NOTE — ED Provider Notes (Signed)
[facility]-EMERGENCY DEPT Provider Note   CSN: [id number] Arrival date & time: 05/28/18  4098     History   Chief Complaint Chief Complaint  Patient presents with  . Nausea    HPI Wesley [name] is a 52 y.o. male with a hx of HTN, anxiety, EtOH abuse, cocaine abuse, pre-diabetes, and malingering who presents to the ED with complaints of nausea and abdominal discomfort since 12:30 AM. Patient states yesterday was a fairly normal day for him, he ate chicken liver for dinner, was feeling okay and went to bed. He states he woke from sleep at 12:30 AM feeling nauseous with epigastric discomfort. Epigastric discomfort is non-radiating, burning in nature, waxing/waning since onset. Continues to have discomfort and nausea at present. No specific alleviating/aggravating factors. He has a prescription for phenergan which he did not take. Denies fever, chills, vomiting, diarrhea, blood in stool, dysuria, frequency, urgency, hematuria, chest pain, dyspnea, diaphoresis, or dizziness.   HPI  Past Medical History:  Diagnosis Date  . Alcohol abuse   . Anxiety   . Anxiety   . Chronic neck pain   . Cocaine abuse (HCC)   . Hypertension   . Prediabetes     Patient Active Problem List   Diagnosis Date Noted  . Legally blind in right eye, as defined in [place] 05/02/2017  . Prediabetes 04/23/2017  . Muscle spasm 04/23/2017  . Essential hypertension 04/16/2017  . H/O medication noncompliance 04/02/2017  . Vitamin D deficiency 04/02/2017  . DJD (degenerative joint disease) of cervical spine 03/16/2017  . 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 Dizziness 01/23/2017  . Anxiety 11/21/2016    Past Surgical History:  Procedure Laterality Date  . EYE SURGERY Right ~ 1980   "hit my eye"        Home Medications    Prior to Admission medications   Medication Sig Start Date End Date Taking? Authorizing Provider  acetaminophen (TYLENOL) 500 MG tablet Take 1,000 mg by mouth daily as needed  (back pain).    [provider]  amLODipine (NORVASC) 10 MG tablet Take 1 tablet (10 mg total) by mouth daily. 02/22/18 05/23/18  Claiborne Rigg, NP  atorvastatin (LIPITOR) 20 MG tablet Take 1 tablet (20 mg total) by mouth daily. 02/26/18   Claiborne Rigg, NP  cyclobenzaprine (FLEXERIL) 10 MG tablet Take 1 tablet (10 mg total) by mouth 2 (two) times daily as needed for muscle spasms. 05/04/18   Alene Mires, NP  lisinopril (PRINIVIL,ZESTRIL) 5 MG tablet Take 1 tablet (5 mg total) by mouth daily. 04/19/18   Claiborne Rigg, NP  LORazepam (ATIVAN) 0.5 MG tablet Take 1 tablet (0.5 mg total) by mouth every 8 (eight) hours as needed for anxiety. 05/22/18   Mardella Layman, MD  meclizine (ANTIVERT) 25 MG tablet Take 1 tablet (25 mg total) by mouth 3 (three) times daily as needed for dizziness. 04/24/18   Frederica Kuster, MD  promethazine (PHENERGAN) 25 MG tablet Take 1 tablet (25 mg total) by mouth every 8 (eight) hours as needed for nausea or vomiting. 04/22/18   Eustace Moore, MD  triamcinolone cream (KENALOG) 0.1 % Apply 1 application topically 2 (two) times daily. Use for 1-2 weeks then as needed. 04/17/18   Mardella Layman, MD    Family History Family History  Problem Relation Age of Onset  . Hypertension Mother   . Hypertension Father     Social History Social History   Tobacco Use  . Smoking status: Former  Smoker  . Smokeless tobacco: Never Used  Substance Use Topics  . Alcohol use: No    Comment: former  . Drug use: Not Currently    Comment: denies, reports past history      Allergies   Ibuprofen; Other; and Zofran [ondansetron hcl]   Review of Systems Review of Systems  Constitutional: Negative for chills, diaphoresis and fever.  Respiratory: Negative for shortness of breath.   Cardiovascular: Negative for chest pain.  Gastrointestinal: Positive for abdominal pain and nausea. Negative for blood in stool, constipation, diarrhea and vomiting.  Genitourinary:  Negative for dysuria, frequency, hematuria and urgency.  Neurological: Negative for dizziness and light-headedness.  All other systems reviewed and are negative.    Physical Exam Updated Vital Signs BP (!) 136/103 (BP Location: Right Arm)   Pulse 87   Temp 98.1 F (36.7 C) (Oral)   Resp 16   Ht 5\' 6"  (1.676 m)   Wt 95.3 kg   SpO2 98%   BMI 33.89 kg/m   Physical Exam  Constitutional: He appears well-developed and well-nourished.  Non-toxic appearance. No distress.  HENT:  Head: Normocephalic and atraumatic.  Eyes: Conjunctivae are normal. Right eye exhibits no discharge. Left eye exhibits no discharge.  Neck: Neck supple.  Cardiovascular: Normal rate and regular rhythm.  Pulmonary/Chest: Effort normal and breath sounds normal. No respiratory distress. He has no wheezes. He has no rhonchi. He has no rales.  Respiration even and unlabored  Abdominal: Soft. He exhibits no distension. There is tenderness in the epigastric area. There is no rigidity, no rebound, no guarding, no tenderness at McBurney's point and negative Murphy's sign.  Neurological: He is alert.  Clear speech.   Skin: Skin is warm and dry. No rash noted.  Psychiatric: He has a normal mood and affect. His behavior is normal.  Nursing note and vitals reviewed.  ED Treatments / Results  Labs Results for orders placed or performed during the hospital encounter of 05/28/18  Lipase, blood  Result Value Ref Range   Lipase 36 11 - 51 U/L  Comprehensive metabolic panel  Result Value Ref Range   Sodium 140 135 - 145 mmol/L   Potassium 3.7 3.5 - 5.1 mmol/L   Chloride 103 98 - 111 mmol/L   CO2 25 22 - 32 mmol/L   Glucose, Bld 106 (H) 70 - 99 mg/dL   BUN 13 6 - 20 mg/dL   Creatinine, Ser 1.610.73 0.61 - 1.24 mg/dL   Calcium 9.4 8.9 - 09.610.3 mg/dL   Total Protein 8.1 6.5 - 8.1 g/dL   Albumin 4.1 3.5 - 5.0 g/dL   AST 29 15 - 41 U/L   ALT 35 0 - 44 U/L   Alkaline Phosphatase 84 38 - 126 U/L   Total Bilirubin 0.5 0.3 -  1.2 mg/dL   GFR calc non Af Amer >60 >60 mL/min   GFR calc Af Amer >60 >60 mL/min   Anion gap 12 5 - 15  CBC  Result Value Ref Range   WBC 6.7 4.0 - 10.5 K/uL   RBC 5.13 4.22 - 5.81 MIL/uL   Hemoglobin 13.8 13.0 - 17.0 g/dL   HCT 04.542.1 40.939.0 - 81.152.0 %   MCV 82.1 78.0 - 100.0 fL   MCH 26.9 26.0 - 34.0 pg   MCHC 32.8 30.0 - 36.0 g/dL   RDW 91.414.0 78.211.5 - 95.615.5 %   Platelets 287 150 - 400 K/uL  Urinalysis, Routine w reflex microscopic  Result Value Ref Range  Color, Urine COLORLESS (A) YELLOW   APPearance CLEAR CLEAR   Specific Gravity, Urine 1.004 (L) 1.005 - 1.030   pH 8.0 5.0 - 8.0   Glucose, UA NEGATIVE NEGATIVE mg/dL   Hgb urine dipstick NEGATIVE NEGATIVE   Bilirubin Urine NEGATIVE NEGATIVE   Ketones, ur NEGATIVE NEGATIVE mg/dL   Protein, ur NEGATIVE NEGATIVE mg/dL   Nitrite NEGATIVE NEGATIVE   Leukocytes, UA NEGATIVE NEGATIVE  I-stat troponin, ED  Result Value Ref Range   Troponin i, poc 0.00 0.00 - 0.08 ng/mL   Comment 3           No results found.  EKG EKG Interpretation  Date/Time:  Tuesday May 28 2018 06:35:01 EDT Ventricular Rate:  94 PR Interval:    QRS Duration: 101 QT Interval:  365 QTC Calculation: 457 R Axis:   -36 Text Interpretation:  Sinus rhythm Incomplete RBBB and LAFB Abnormal R-wave progression, late transition No significant change since last tracing Confirmed by Rochele Raring [phone]) on 05/28/2018 6:45:48 AM   Radiology No results found.  Procedures Procedures (including critical care time)  Medications Ordered in ED Medications  promethazine (PHENERGAN) tablet 25 mg (25 mg Oral Given 05/28/18 0654)  gi cocktail (Maalox,Lidocaine,Donnatal) (30 mLs Oral Given 05/28/18 0654)     Initial Impression / Assessment and Plan / ED Course  I have reviewed the triage vital signs and the nursing notes.  Pertinent labs & imaging results that were available during my care of the patient were reviewed by me and considered in my medical decision  making (see chart for details).    Patient presents to the ED with complaints of nausea and epigastric abdominal pain. Patient nontoxic appearing, in no apparent distress, vitals WNL other than elevated BP, doubt HTN emergency, patient aware of need for recheck. On exam patient tender to palpation in the epigastrium, no peritoneal signs. Will evaluate with labs and EKG. Trial patient's prescribed phenergan with GI Cocktail.   Labs reviewed and grossly unremarkable. No leukocytosis, no anemia, no significant electrolyte derangements. LFTs, renal function, and lipase WNL. Urinalysis without obvious infection. Troponin negative after > 3 hours of sxs, EKG without significant change from prior, doubt ACS. On repeat abdominal exam patient remains without peritoneal signs, doubt acute cholecystitis, pancreatitis, diverticulitis, appendicitis, bowel obstruction/perforation.  Suspect GERD vs. PUD. Patient tolerating PO in the emergency department, feeling improved following GI cocktail. He has a prescription for phenergan he can take at home, also will prescribe carafate and omeprazole. Diet recommendations provided. I discussed results, treatment plan, need for PCP follow-up, and return precautions with the patient. Provided opportunity for questions, patient confirmed understanding and is in agreement with plan.   Final Clinical Impressions(s) / ED Diagnoses   Final diagnoses:  Epigastric pain  Nausea    ED Discharge Orders         Ordered    sucralfate (CARAFATE) 1 GM/10ML suspension  3 times daily with meals & bedtime     05/28/18 0718    omeprazole (PRILOSEC) 20 MG capsule  Daily     05/28/18 0718           Cherly Anderson, PA-C 05/28/18 0742    Ward, Layla Maw, DO 05/28/18 2318

## 2018-05-28 NOTE — Discharge Instructions (Addendum)
You are seen in the emergency department today for nausea and abdominal pain.  Your EKG did not show any significant changes from prior, your enzyme used to check your heart was normal.  Your overall blood work is reassuring without significant problems with your liver function, kidney function, or pancreas function.   We would like you to take your phenergan as prescribed. We are sending you home with carafate and omeprazole, please take this as prescribed to help prevent acid in the stomach as we feel that this may have contributed to your symptoms today.    We have prescribed you new medication(s) today. Discuss the medications prescribed today with your pharmacist as they can have adverse effects and interactions with your other medicines including over the counter and prescribed medications. Seek medical evaluation if you start to experience new or abnormal symptoms after taking one of these medicines, seek care immediately if you start to experience difficulty breathing, feeling of your throat closing, facial swelling, or rash as these could be indications of a more serious allergic reaction   Follow up with your primary care provider within 3 days for re-evaluation. Return to the ER for new or worsening symptoms including, but not limited to fever, worsened pain, inability to keep fluids down, blood in stool, or any other concerns.

## 2018-05-31 NOTE — ED Provider Notes (Signed)
[facility]   [id number] 05/22/18 Arrival Time: 1927  ASSESSMENT & PLAN:  1. Nausea without vomiting   2. Essential hypertension     Meds ordered this encounter  Medications  . LORazepam (ATIVAN) 0.5 MG tablet    Sig: Take 1 tablet (0.5 mg total) by mouth every 8 (eight) hours as needed for anxiety.    Dispense:  15 tablet    Refill:  0   Medication sedation precautions. Will do his best to ensure adequate fluid intake. Will proceed to the Emergency Department for evaluation if unable to tolerate PO fluids regularly.  Otherwise he will f/u with his PCP or here if not showing improvement over the next 48-72 hours.  Reviewed expectations re: course of current medical issues. Questions answered. Outlined signs and symptoms indicating need for more acute intervention. Patient verbalized understanding. After Visit Summary given.   SUBJECTIVE: History from: patient.  Wesley [name] is a 52 y.o. male who presents with a recurrent complaint of "dizziness". Many evaluations in the past for the same. No change in symptoms compared to previous. Describes lightheaded feeling; transient in nature. Questions if this is related to anxiety which he feels has been worse lately. Sometimes he feels lightheaded when he stands up quickly but this is not unusual for him. No recent illnesses. Mild nausea yesterday and today. No diarrhea. No abdominal pain. Normal PO intake. Normal bowel/bladder habits. No recent travel. Has used Ativan in the past for intermittent anxiety symptoms. No new medications. Taking his BP medication regularly. He is persistently focused on his blood pressure. Requests fingerstick blood sugar. Has been told in the past that he may be at risk for diabetes. Last meal a couple of hours ago.  Past Surgical History:  Procedure Laterality Date  . EYE SURGERY Right ~ 1980   "hit my eye"    ROS: As per HPI.  OBJECTIVE:  Vitals:   05/22/18 2006 05/22/18 2008  BP:  (!) 146/98   Pulse: 88   Resp: 18   Temp: 97.9 F (36.6 C)   TempSrc: Oral   SpO2: 98%   Weight:  90.7 kg    General appearance: alert; no distress; acting his usual self Oropharynx: moist Lungs: clear to auscultation bilaterally Heart: regular rate and rhythm Abdomen: soft; non-distended; no significant abdominal tenderness; bowel sounds present; no masses or organomegaly; no guarding or rebound tenderness Back: no CVA tenderness Extremities: no edema; symmetrical with no gross deformities Skin: warm and dry Neurologic: normal gait Psychological: alert and cooperative; normal mood and affect  Labs: Results for orders placed or performed during the hospital encounter of 05/22/18  Glucose, capillary  Result Value Ref Range   Glucose-Capillary 105 (H) 70 - 99 mg/dL   Labs Reviewed  GLUCOSE, CAPILLARY - Abnormal; Notable for the following components:      Result Value   Glucose-Capillary 105 (*)    All other components within normal limits    Allergies  Allergen Reactions  . Ibuprofen Other (See Comments) and Rash    Makes his throat get "very dry" after taking it States he cant take it  . Other Other (See Comments)    Steroids: Patient received a steroid shot from [facility] [phone] and it resulted in his muscles twitching all over his body  . Zofran [Ondansetron Hcl] Other (See Comments)    Causes sweating and body "feels badly"  Past Medical History:  Diagnosis Date  . Alcohol abuse   . Anxiety   . Anxiety   . Chronic neck pain   . Cocaine abuse (HCC)   . Hypertension   . Prediabetes    Social History   Socioeconomic History  . Marital status: Significant Other    Spouse name: Not on file  . Number of children: Not on file  . Years of education: Not on file  . Highest education level: Not on file  Occupational History  . Not on file  Social Needs  . Financial resource strain: Not on file    . Food insecurity:    Worry: Not on file    Inability: Not on file  . Transportation needs:    Medical: Not on file    Non-medical: Not on file  Tobacco Use  . Smoking status: Former Games developer  . Smokeless tobacco: Never Used  Substance and Sexual Activity  . Alcohol use: No    Comment: former  . Drug use: Not Currently    Comment: denies, reports past history   . Sexual activity: Not Currently  Lifestyle  . Physical activity:    Days per week: Not on file    Minutes per session: Not on file  . Stress: Not on file  Relationships  . Social connections:    Talks on phone: Not on file    Gets together: Not on file    Attends religious service: Not on file    Active member of club or organization: Not on file    Attends meetings of clubs or organizations: Not on file    Relationship status: Not on file  . Intimate partner violence:    Fear of current or ex partner: Not on file    Emotionally abused: Not on file    Physically abused: Not on file    Forced sexual activity: Not on file  Other Topics Concern  . Not on file  Social History Narrative  . Not on file   Family History  Problem Relation Age of Onset  . Hypertension Mother   . Hypertension Father      Mardella Layman, MD 05/31/18 [phone]

## 2018-06-02 ENCOUNTER — Encounter (HOSPITAL_COMMUNITY): Payer: Self-pay | Admitting: Urgent Care

## 2018-06-02 ENCOUNTER — Emergency Department (HOSPITAL_COMMUNITY): Admission: EM | Admit: 2018-06-02 | Discharge: 2018-06-02 | Discharge status: Against Medical Advice | Payer: Self-pay

## 2018-06-02 ENCOUNTER — Ambulatory Visit (HOSPITAL_COMMUNITY)
Admission: EM | Admit: 2018-06-02 | Discharge: 2018-06-02 | Disposition: A | Discharge status: Home / Self Care | Payer: Self-pay | Attending: Family Medicine | Admitting: Family Medicine

## 2018-06-02 DIAGNOSIS — M948X8 Other specified disorders of cartilage, other site: Secondary | ICD-10-CM

## 2018-06-02 DIAGNOSIS — G8929 Other chronic pain: Secondary | ICD-10-CM

## 2018-06-02 DIAGNOSIS — R1013 Epigastric pain: Secondary | ICD-10-CM

## 2018-06-02 MED ORDER — GI COCKTAIL ~~LOC~~
ORAL | Status: AC
  Filled 2018-06-02: qty 30

## 2018-06-02 MED ORDER — GI COCKTAIL ~~LOC~~
30.0000 mL | Freq: Once | ORAL | Status: AC
  Administered 2018-06-02: 30 mL via ORAL

## 2018-06-02 NOTE — ED Provider Notes (Signed)
[facility]    CSN: [id number] Arrival date & time: 06/02/18  1604     History   Chief Complaint No chief complaint on file.   HPI Wesley [name] is a 52 y.o. male.   52 yo regular visitor to emergency services.  He was seen 5 days ago (see note below) but could not afford discharge meds.  He is taking omeprazole.  He complains of epigastric and xiphoid area pain (both).  No vomiting, but he does have nausea.  No diarrhea, fever, blood in stool, shortness of breath.   ED Note from 05/28/18: Wesley [name] is a 52 y.o. male with a hx of HTN, anxiety, EtOH abuse, cocaine abuse, pre-diabetes, and malingering who presents to the ED with complaints of nausea and abdominal discomfort since 12:30 AM. Patient states yesterday was a fairly normal day for him, he ate chicken liver for dinner, was feeling okay and went to bed. He states he woke from sleep at 12:30 AM feeling nauseous with epigastric discomfort. Epigastric discomfort is non-radiating, burning in nature, waxing/waning since onset. Continues to have discomfort and nausea at present. No specific alleviating/aggravating factors. He has a prescription for phenergan which he did not take. Denies fever, chills, vomiting, diarrhea, blood in stool, dysuria, frequency, urgency, hematuria, chest pain, dyspnea, diaphoresis, or dizziness.       Past Medical History:  Diagnosis Date  . Alcohol abuse   . Anxiety   . Anxiety   . Chronic neck pain   . Cocaine abuse (HCC)   . Hypertension   . Prediabetes     Patient Active Problem List   Diagnosis Date Noted  . 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 Prediabetes 04/23/2017  . Muscle spasm 04/23/2017  . Essential hypertension 04/16/2017  . H/O medication noncompliance 04/02/2017  . Vitamin D deficiency 04/02/2017  . DJD (degenerative joint disease) of cervical spine 03/16/2017  . 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 Dizziness 01/23/2017  . Anxiety 11/21/2016     Past Surgical History:  Procedure Laterality Date  . EYE SURGERY Right ~ 1980   "hit my eye"       Home Medications    Prior to Admission medications   Medication Sig Start Date End Date Taking? Authorizing Provider  acetaminophen (TYLENOL) 500 MG tablet Take 1,000 mg by mouth daily as needed (back pain).    [provider]  amLODipine (NORVASC) 10 MG tablet Take 1 tablet (10 mg total) by mouth daily. 02/22/18 05/23/18  Claiborne RiggFleming, Zelda W, NP  atorvastatin (LIPITOR) 20 MG tablet Take 1 tablet (20 mg total) by mouth daily. 02/26/18   Claiborne RiggFleming, Zelda W, NP  lisinopril (PRINIVIL,ZESTRIL) 5 MG tablet Take 1 tablet (5 mg total) by mouth daily. 04/19/18   Claiborne RiggFleming, Zelda W, NP  LORazepam (ATIVAN) 0.5 MG tablet Take 1 tablet (0.5 mg total) by mouth every 8 (eight) hours as needed for anxiety. 05/22/18   Mardella LaymanHagler, Brian, MD  meclizine (ANTIVERT) 25 MG tablet Take 1 tablet (25 mg total) by mouth 3 (three) times daily as needed for dizziness. 04/24/18   Frederica KusterMiller, Stephen M, MD  omeprazole (PRILOSEC) 20 MG capsule Take 1 capsule (20 mg total) by mouth daily. 05/28/18   Petrucelli, Pleas KochSamantha R, PA-C  promethazine (PHENERGAN) 25 MG tablet Take 1 tablet (25 mg total) by mouth every 8 (eight) hours as needed for nausea or vomiting. 04/22/18   Eustace MooreNelson, Yvonne Sue, MD    Family History Family History  Problem Relation  Age of Onset  . Hypertension Mother   . Hypertension Father     Social History Social History   Tobacco Use  . Smoking status: Former Games developer  . Smokeless tobacco: Never Used  Substance Use Topics  . Alcohol use: No    Comment: former  . Drug use: Not Currently    Comment: denies, reports past history      Allergies   Ibuprofen; Other; and Zofran [ondansetron hcl]   Review of Systems Review of Systems  Constitutional: Negative.   Respiratory: Negative for cough, choking, chest tightness and shortness of breath.   Cardiovascular: Positive for chest pain.  Gastrointestinal:  Positive for abdominal pain and nausea. Negative for diarrhea and vomiting.     Physical Exam Triage Vital Signs ED Triage Vitals  Enc Vitals Group     BP      Pulse      Resp      Temp      Temp src      SpO2      Weight      Height      Head Circumference      Peak Flow      Pain Score      Pain Loc      Pain Edu?      Excl. in GC?    No data found.  Updated Vital Signs BP 134/90 (BP Location: Right Arm)   Pulse 92   Temp 97.9 F (36.6 C) (Oral)   Resp 20   SpO2 98%   Physical Exam  Constitutional: He is oriented to person, place, and time. He appears well-developed.  Somewhat disheveled and overweight  HENT:  Right Ear: External ear normal.  Left Ear: External ear normal.  Poor dental condition  Eyes: Conjunctivae are normal.  Neck: Normal range of motion. Neck supple.  Cardiovascular: Normal rate, regular rhythm and normal heart sounds.  Pulmonary/Chest: Effort normal and breath sounds normal.  Abdominal: Soft.  Tender xiphoid process Tender epigastrium with deep palpation  Musculoskeletal: Normal range of motion.  Neurological: He is alert and oriented to person, place, and time.  Skin: Skin is warm and dry.  Nursing note and vitals reviewed.    UC Treatments / Results  Labs (all labs ordered are listed, but only abnormal results are displayed) Labs Reviewed - No data to display  EKG None  Radiology No results found.  Procedures Procedures (including critical care time)  Medications Ordered in UC Medications  gi cocktail (Maalox,Lidocaine,Donnatal) (30 mLs Oral Given 06/02/18 1736)    Initial Impression / Assessment and Plan / UC Course  I have reviewed the triage vital signs and the nursing notes.  Pertinent labs & imaging results that were available during my care of the patient were reviewed by me and considered in my medical decision making (see chart for details).    Final Clinical Impressions(s) / UC Diagnoses   Final  diagnoses:  Xiphoiditis  Abdominal pain, chronic, epigastric     Discharge Instructions     Continue omeprazole  Take one meloxicam daily.  See your primary care doctor next week.    ED Prescriptions    None     Controlled Substance Prescriptions [place] Controlled Substance Registry consulted? Not Applicable   Elvina Sidle, MD 06/02/18 1742

## 2018-06-02 NOTE — Discharge Instructions (Addendum)
Continue omeprazole  Take one meloxicam daily.  See your primary care doctor next week.

## 2018-06-07 ENCOUNTER — Ambulatory Visit (HOSPITAL_COMMUNITY)
Admission: EM | Admit: 2018-06-07 | Discharge: 2018-06-07 | Disposition: A | Discharge status: Home / Self Care | Payer: Self-pay | Attending: Family Medicine | Admitting: Family Medicine

## 2018-06-07 ENCOUNTER — Emergency Department (HOSPITAL_COMMUNITY)
Admission: EM | Admit: 2018-06-07 | Discharge: 2018-06-07 | Discharge status: Against Medical Advice | Payer: Self-pay | Attending: Emergency Medicine | Admitting: Emergency Medicine

## 2018-06-07 ENCOUNTER — Encounter (HOSPITAL_COMMUNITY): Payer: Self-pay | Admitting: Emergency Medicine

## 2018-06-07 DIAGNOSIS — R42 Dizziness and giddiness: Secondary | ICD-10-CM | POA: Insufficient documentation

## 2018-06-07 DIAGNOSIS — Z5321 Procedure and treatment not carried out due to patient leaving prior to being seen by health care provider: Secondary | ICD-10-CM | POA: Insufficient documentation

## 2018-06-07 DIAGNOSIS — F411 Generalized anxiety disorder: Secondary | ICD-10-CM

## 2018-06-07 DIAGNOSIS — I10 Essential (primary) hypertension: Secondary | ICD-10-CM

## 2018-06-07 LAB — CBC WITH DIFFERENTIAL/PLATELET
Abs Immature Granulocytes: 0 10*3/uL (ref 0.0–0.1)
Basophils Absolute: 0 10*3/uL (ref 0.0–0.1)
Basophils Relative: 0 %
Eosinophils Absolute: 0.2 10*3/uL (ref 0.0–0.7)
Eosinophils Relative: 2 %
HCT: 43.3 % (ref 39.0–52.0)
Hemoglobin: 14.1 g/dL (ref 13.0–17.0)
Immature Granulocytes: 0 %
Lymphocytes Relative: 41 %
Lymphs Abs: 3.1 10*3/uL (ref 0.7–4.0)
MCH: 27.1 pg (ref 26.0–34.0)
MCHC: 32.6 g/dL (ref 30.0–36.0)
MCV: 83.3 fL (ref 78.0–100.0)
Monocytes Absolute: 0.7 10*3/uL (ref 0.1–1.0)
Monocytes Relative: 10 %
Neutro Abs: 3.6 10*3/uL (ref 1.7–7.7)
Neutrophils Relative %: 47 %
Platelets: 279 10*3/uL (ref 150–400)
RBC: 5.2 MIL/uL (ref 4.22–5.81)
RDW: 13.6 % (ref 11.5–15.5)
WBC: 7.7 10*3/uL (ref 4.0–10.5)

## 2018-06-07 LAB — BASIC METABOLIC PANEL
Anion gap: 12 (ref 5–15)
BUN: 8 mg/dL (ref 6–20)
CO2: 24 mmol/L (ref 22–32)
Calcium: 9.5 mg/dL (ref 8.9–10.3)
Chloride: 100 mmol/L (ref 98–111)
Creatinine, Ser: 0.68 mg/dL (ref 0.61–1.24)
GFR calc Af Amer: >60 mL/min (ref >60)
GFR calc non Af Amer: >60 mL/min (ref >60)
Glucose, Bld: 132 mg/dL — ABNORMAL HIGH (ref 70–99)
Potassium: 3.6 mmol/L (ref 3.5–5.1)
Sodium: 136 mmol/L (ref 135–145)

## 2018-06-07 MED ORDER — AMLODIPINE BESYLATE 10 MG PO TABS: 10.0000 mg | ORAL_TABLET | Freq: Every day | ORAL | 1 refills | Status: DC

## 2018-06-07 MED ORDER — LORAZEPAM 0.5 MG PO TABS: 0.5000 mg | ORAL_TABLET | Freq: Two times a day (BID) | ORAL | 1 refills | Status: DC

## 2018-06-07 NOTE — ED Notes (Signed)
Pt called for VS recheck, no answer 

## 2018-06-07 NOTE — Discharge Instructions (Signed)
Melatonin 5 mg antes de dormir.

## 2018-06-07 NOTE — ED Notes (Signed)
Pt called several times by multiple people with no response.

## 2018-06-07 NOTE — ED Triage Notes (Signed)
Pt here for his chronic dizziness.

## 2018-06-07 NOTE — ED Provider Notes (Signed)
[facility]    CSN: [id number] Arrival date & time: 06/07/18  1050     History   Chief Complaint Chief Complaint  Patient presents with  . Dizziness    HPI Wesley [name] is a 52 y.o. male.   Is a 52 year old man with chronic anxiety.  He ran out of his blood pressure medicine and his anxiety medicine.  He states that he has an appointment with his primary care doctor in 2 weeks.  Patient has no chest pain or shortness of breath.  His xiphoiditis that he experienced a few days ago has resolved.  He still has some acid indigestion but he says that is better.     Past Medical History:  Diagnosis Date  . Alcohol abuse   . Anxiety   . Anxiety   . Chronic neck pain   . Cocaine abuse (HCC)   . Hypertension   . Prediabetes     Patient Active Problem List   Diagnosis Date Noted  . Legally blind in right eye, as defined in [place] 05/02/2017  . Prediabetes 04/23/2017  . Muscle spasm 04/23/2017  . Essential hypertension 04/16/2017  . H/O medication noncompliance 04/02/2017  . Vitamin D deficiency 04/02/2017  . DJD (degenerative joint disease) of cervical spine 03/16/2017  . 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 Dizziness 01/23/2017  . Anxiety 11/21/2016    Past Surgical History:  Procedure Laterality Date  . EYE SURGERY Right ~ 1980   "hit my eye"       Home Medications    Prior to Admission medications   Medication Sig Start Date End Date Taking? Authorizing Provider  acetaminophen (TYLENOL) 500 MG tablet Take 1,000 mg by mouth daily as needed (back pain).    [provider]  amLODipine (NORVASC) 10 MG tablet Take 1 tablet (10 mg total) by mouth daily. 06/07/18 09/05/18  Elvina Sidle, MD  atorvastatin (LIPITOR) 20 MG tablet Take 1 tablet (20 mg total) by mouth daily. 02/26/18   Claiborne Rigg, NP  lisinopril (PRINIVIL,ZESTRIL) 5 MG tablet Take 1 tablet (5 mg total) by mouth daily. 04/19/18   Claiborne Rigg, NP  LORazepam (ATIVAN) 0.5 MG tablet Take 1  tablet (0.5 mg total) by mouth 2 (two) times daily. 06/07/18   Elvina Sidle, MD  meclizine (ANTIVERT) 25 MG tablet Take 1 tablet (25 mg total) by mouth 3 (three) times daily as needed for dizziness. 04/24/18   Frederica Kuster, MD  omeprazole (PRILOSEC) 20 MG capsule Take 1 capsule (20 mg total) by mouth daily. 05/28/18   Petrucelli, Pleas Koch, PA-C  promethazine (PHENERGAN) 25 MG tablet Take 1 tablet (25 mg total) by mouth every 8 (eight) hours as needed for nausea or vomiting. 04/22/18   Eustace Moore, MD    Family History Family History  Problem Relation Age of Onset  . Hypertension Mother   . Hypertension Father     Social History Social History   Tobacco Use  . Smoking status: Former Games developer  . Smokeless tobacco: Never Used  Substance Use Topics  . Alcohol use: No    Comment: former  . Drug use: Not Currently    Comment: denies, reports past history      Allergies   Ibuprofen; Other; and Zofran [ondansetron hcl]   Review of Systems Review of Systems  Psychiatric/Behavioral: Positive for agitation and sleep disturbance.  All other systems reviewed and are negative.    Physical Exam Triage Vital Signs ED Triage Vitals [06/07/18 1139]  Enc Vitals Group     BP (!) 146/94     Pulse Rate (!) 104     Resp 18     Temp 97.8 F (36.6 C)     Temp src      SpO2 100 %     Weight      Height      Head Circumference      Peak Flow      Pain Score 0     Pain Loc      Pain Edu?      Excl. in GC?    No data found.  Updated Vital Signs BP (!) 146/94   Pulse (!) 104   Temp 97.8 F (36.6 C)   Resp 18   SpO2 100%   Visual Acuity Right Eye Distance:   Left Eye Distance:   Bilateral Distance:    Right Eye Near:   Left Eye Near:    Bilateral Near:     Physical Exam  Constitutional: He is oriented to person, place, and time. He appears well-developed and well-nourished.  HENT:  Right Ear: External ear normal.  Left Ear: External ear normal.    Mouth/Throat: Oropharynx is clear and moist.  TMs appear normal  Eyes: Pupils are equal, round, and reactive to light. Conjunctivae are normal.  Neck: Normal range of motion. Neck supple.  Cardiovascular: Normal rate, regular rhythm and normal heart sounds.  No palpable chest pain  Pulmonary/Chest: Effort normal and breath sounds normal.  Musculoskeletal: Normal range of motion.  Neurological: He is alert and oriented to person, place, and time.  Skin: Skin is warm and dry.  Psychiatric:  Anxious individual with some pressured speech  Nursing note and vitals reviewed.    UC Treatments / Results  Labs (all labs ordered are listed, but only abnormal results are displayed) Labs Reviewed - No data to display  EKG None  Radiology No results found.  Procedures Procedures (including critical care time)  Medications Ordered in UC Medications - No data to display  Initial Impression / Assessment and Plan / UC Course  I have reviewed the triage vital signs and the nursing notes.  Pertinent labs & imaging results that were available during my care of the patient were reviewed by me and considered in my medical decision making (see chart for details).    Final Clinical Impressions(s) / UC Diagnoses   Final diagnoses:  Essential hypertension  Generalized anxiety disorder     Discharge Instructions     Melatonin 5 mg antes de dormir    ED Prescriptions    Medication Sig Dispense Auth. Provider   amLODipine (NORVASC) 10 MG tablet Take 1 tablet (10 mg total) by mouth daily. 90 tablet Elvina Sidle, MD   LORazepam (ATIVAN) 0.5 MG tablet Take 1 tablet (0.5 mg total) by mouth 2 (two) times daily. 30 tablet Elvina Sidle, MD     Controlled Substance Prescriptions [place] Controlled Substance Registry consulted? Not Applicable   Elvina Sidle, MD 06/07/18 1203

## 2018-06-07 NOTE — ED Provider Notes (Signed)
Patient placed in Quick Look pathway, seen and evaluated   Chief Complaint: dizzy  HPI:   Wesley [name] is a 52 y.o. male who presents to the ED with c/o dizziness that started this morning. Patient ran out of his BP and anxiety medication and went to Urgent Care this morning and had his medication refilled but continues to feel bad.   ROS: Neuro: dizzy  Physical Exam: BP (!) 150/101 (BP Location: Right Arm)   Pulse (!) 123   Temp 98.9 F (37.2 C) (Oral)   Resp 18   SpO2 98%    Gen: No distress  Neuro: Awake and Alert, grips are equal, no pronator drift, no facial weakness, equal strength lower extremities, ambulatory without difficulty  Skin: Warm and dry  Heart: tachycardic  Lungs: without wheezing or rales   Initiation of care has begun. The patient has been counseled on the process, plan, and necessity for staying for the completion/evaluation, and the remainder of the medical screening examination    Janne Napoleon, NP 06/07/18 1321    Sabas Sous, MD 06/07/18 1718

## 2018-06-11 ENCOUNTER — Emergency Department (HOSPITAL_COMMUNITY)
Admission: EM | Admit: 2018-06-11 | Discharge: 2018-06-11 | Disposition: A | Discharge status: Home / Self Care | Payer: Self-pay | Attending: Emergency Medicine | Admitting: Emergency Medicine

## 2018-06-11 ENCOUNTER — Encounter (HOSPITAL_COMMUNITY): Payer: Self-pay | Admitting: *Deleted

## 2018-06-11 ENCOUNTER — Other Ambulatory Visit: Payer: Self-pay

## 2018-06-11 DIAGNOSIS — Z87891 Personal history of nicotine dependence: Secondary | ICD-10-CM | POA: Insufficient documentation

## 2018-06-11 DIAGNOSIS — F1410 Cocaine abuse, uncomplicated: Secondary | ICD-10-CM | POA: Insufficient documentation

## 2018-06-11 DIAGNOSIS — I10 Essential (primary) hypertension: Secondary | ICD-10-CM | POA: Insufficient documentation

## 2018-06-11 DIAGNOSIS — Z79899 Other long term (current) drug therapy: Secondary | ICD-10-CM | POA: Insufficient documentation

## 2018-06-11 DIAGNOSIS — M542 Cervicalgia: Secondary | ICD-10-CM | POA: Insufficient documentation

## 2018-06-11 DIAGNOSIS — F419 Anxiety disorder, unspecified: Secondary | ICD-10-CM | POA: Insufficient documentation

## 2018-06-11 DIAGNOSIS — F1010 Alcohol abuse, uncomplicated: Secondary | ICD-10-CM | POA: Insufficient documentation

## 2018-06-11 NOTE — ED Provider Notes (Signed)
[facility]-EMERGENCY DEPT Provider Note   CSN: [id number] Arrival date & time: 06/11/18  0136     History   Chief Complaint Chief Complaint  Patient presents with  . Neck Pain    HPI Wesley [name] is a 52 y.o. male.  The history is provided by the patient and medical records.  Neck Pain       52 year old male with history of alcohol abuse, anxiety, chronic neck pain, hypertension, presenting to the ED for neck pain.  Patient with 37 visits in the past 6 months for pain or anxiety related complaints.  States he was feeling anxious last evening, took his Ativan but continued feeling bad.  States he tried to lay down and felt "tingling" all over his body including the back of his head.  States he thought it might of been from neck pain so he took a muscle relaxer without change.  He denies any focal numbness or weakness of his arms or legs.  He has not had any trouble walking.  No headaches, dizziness, confusion, tinnitus, or blurred vision.  Patient states "it might be my circulation".  He is requesting an MRI of his head.  Past Medical History:  Diagnosis Date  . Alcohol abuse   . Anxiety   . Anxiety   . Chronic neck pain   . Cocaine abuse (HCC)   . Hypertension   . Prediabetes     Patient Active Problem List   Diagnosis Date Noted  . Legally blind in right eye, as defined in [place] 05/02/2017  . Prediabetes 04/23/2017  . Muscle spasm 04/23/2017  . Essential hypertension 04/16/2017  . H/O medication noncompliance 04/02/2017  . Vitamin D deficiency 04/02/2017  . DJD (degenerative joint disease) of cervical spine 03/16/2017  . 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 Dizziness 01/23/2017  . Anxiety 11/21/2016    Past Surgical History:  Procedure Laterality Date  . EYE SURGERY Right ~ 1980   "hit my eye"        Home Medications    Prior to Admission medications   Medication Sig Start Date End Date Taking? Authorizing Provider  acetaminophen (TYLENOL) 500  MG tablet Take 1,000 mg by mouth daily as needed (back pain).    [provider]  amLODipine (NORVASC) 10 MG tablet Take 1 tablet (10 mg total) by mouth daily. 06/07/18 09/05/18  Elvina Sidle, MD  atorvastatin (LIPITOR) 20 MG tablet Take 1 tablet (20 mg total) by mouth daily. 02/26/18   Claiborne Rigg, NP  lisinopril (PRINIVIL,ZESTRIL) 5 MG tablet Take 1 tablet (5 mg total) by mouth daily. 04/19/18   Claiborne Rigg, NP  LORazepam (ATIVAN) 0.5 MG tablet Take 1 tablet (0.5 mg total) by mouth 2 (two) times daily. 06/07/18   Elvina Sidle, MD  meclizine (ANTIVERT) 25 MG tablet Take 1 tablet (25 mg total) by mouth 3 (three) times daily as needed for dizziness. 04/24/18   Frederica Kuster, MD  omeprazole (PRILOSEC) 20 MG capsule Take 1 capsule (20 mg total) by mouth daily. 05/28/18   Petrucelli, Pleas Koch, PA-C  promethazine (PHENERGAN) 25 MG tablet Take 1 tablet (25 mg total) by mouth every 8 (eight) hours as needed for nausea or vomiting. 04/22/18   Eustace Moore, MD    Family History Family History  Problem Relation Age of Onset  . Hypertension Mother   . Hypertension Father     Social History Social History   Tobacco Use  . Smoking status: Former Games developer  .  Smokeless tobacco: Never Used  Substance Use Topics  . Alcohol use: No    Comment: former  . Drug use: Not Currently    Comment: denies, reports past history      Allergies   Ibuprofen; Other; and Zofran [ondansetron hcl]   Review of Systems Review of Systems  Musculoskeletal: Positive for neck pain.  All other systems reviewed and are negative.    Physical Exam Updated Vital Signs BP (!) 143/90   Pulse 90   Temp 98.1 F (36.7 C) (Oral)   Resp 15   Ht 5\' 6"  (1.676 m)   Wt 90.7 kg   SpO2 98%   BMI 32.28 kg/m   Physical Exam  Constitutional: He is oriented to person, place, and time. He appears well-developed and well-nourished. No distress.  HENT:  Head: Normocephalic and atraumatic.  Right  Ear: External ear normal.  Left Ear: External ear normal.  Mouth/Throat: Oropharynx is clear and moist.  Eyes: Pupils are equal, round, and reactive to light. Conjunctivae and EOM are normal.  Neck: Normal range of motion and full passive range of motion without pain. Neck supple. No neck rigidity.  No rigidity, no meningismus  Cardiovascular: Normal rate, regular rhythm and normal heart sounds.  No murmur heard. Pulmonary/Chest: Effort normal and breath sounds normal. No respiratory distress. He has no wheezes. He has no rhonchi.  Abdominal: Soft. Bowel sounds are normal. There is no tenderness. There is no guarding.  Musculoskeletal: Normal range of motion. He exhibits no edema.  Neck is nontender, no midline step-off or deformity, full range of motion maintained  Neurological: He is alert and oriented to person, place, and time. He has normal strength. He displays no tremor. No cranial nerve deficit or sensory deficit. He displays no seizure activity.  AAOx3, answering questions and following commands appropriately; equal strength UE and LE bilaterally; CN grossly intact; moves all extremities appropriately without ataxia; no focal neuro deficits or facial asymmetry appreciated  Skin: Skin is warm and dry. No rash noted. He is not diaphoretic.  Psychiatric: His behavior is normal. Thought content normal. His mood appears anxious.  Very anxious appearing, ruminating thoughts  Nursing note and vitals reviewed.    ED Treatments / Results  Labs (all labs ordered are listed, but only abnormal results are displayed) Labs Reviewed - No data to display  EKG None  Radiology No results found.  Procedures Procedures (including critical care time)  Medications Ordered in ED Medications - No data to display   Initial Impression / Assessment and Plan / ED Course  I have reviewed the triage vital signs and the nursing notes.  Pertinent labs & imaging results that were available during  my care of the patient were reviewed by me and considered in my medical decision making (see chart for details).  52 year old male presenting to the ED with neck pain.  Patient well-known to this facility, 37 visits in the past 6 months.  He is afebrile and nontoxic.  He is very anxious appearing on exam with ruminating thoughts.  He reports generalized "tingling" in addition to neck pain.  He has history of chronic neck pain.  He denies any new injury, trauma, or falls.  No deformities on exam.  He is awake, alert, appropriately oriented.  Neurologic exam is nonfocal.  Patient is requesting MRI, however I do not feel this is clinically indicated.  He has history of chronic neck pain and well-documented history of significant anxiety.  Both of these are  likely contributing to his symptoms today.  His vitals are stable and I do not suspect acute TIA, CVA, meningitis, central cord syndrome, or other acute neurologic process.  Feel he can be discharged follow-up with his primary care doctor, states he has an appointment scheduled in 2 weeks for full physical.  Continue anxiety medications.  He can return here for any new or acute changes.  Final Clinical Impressions(s) / ED Diagnoses   Final diagnoses:  Neck pain    ED Discharge Orders    None       Garlon Hatchet, PA-C 06/11/18 0430    Molpus, Jonny Ruiz, MD 06/11/18 [phone]

## 2018-06-11 NOTE — Discharge Instructions (Signed)
Follow-up with your primary care doctor. °Return to the ED for new or worsening symptoms. °

## 2018-06-11 NOTE — ED Triage Notes (Signed)
Pt reports chronic neck pain which caused him to wake up at 2100 last night.  He reports his body shaking.  Took a muscle relaxer without relief.

## 2018-06-12 ENCOUNTER — Encounter (HOSPITAL_COMMUNITY): Payer: Self-pay | Admitting: Emergency Medicine

## 2018-06-12 ENCOUNTER — Other Ambulatory Visit: Payer: Self-pay

## 2018-06-12 ENCOUNTER — Emergency Department (HOSPITAL_COMMUNITY)
Admission: EM | Admit: 2018-06-12 | Discharge: 2018-06-12 | Disposition: A | Discharge status: Home / Self Care | Payer: Self-pay | Attending: Emergency Medicine | Admitting: Emergency Medicine

## 2018-06-12 DIAGNOSIS — R42 Dizziness and giddiness: Secondary | ICD-10-CM | POA: Insufficient documentation

## 2018-06-12 NOTE — ED Notes (Signed)
Pt called no answer 

## 2018-06-12 NOTE — ED Triage Notes (Signed)
Pt states he feels dizzy after taking a muscle relaxer, then he felt his body shaking and his blood pressure go up so he took amlodipine. No distress.

## 2018-06-12 NOTE — ED Provider Notes (Signed)
Patient placed in Quick Look pathway, seen and evaluated   Chief Complaint: dizziness  HPI:  Wesley [name] is a 52 y.o. male with frequent visits to the ED and has a care plan. He reports today he feels nervous and short of breath.  ROS: Resp: shortness of breath  Physical Exam:  BP (!) 150/88 (BP Location: Right Arm)   Pulse (!) 122   Resp 20   SpO2 99%    Gen: No distress  Neuro: Awake and Alert  Skin: Warm and dry  Lungs: clear  Heart: tachycardia   Initiation of care has begun. The patient has been counseled on the process, plan, and necessity for staying for the completion/evaluation, and the remainder of the medical screening examination    Janne Napoleon, NP 06/12/18 1330    Charlynne Pander, MD 06/12/18 [phone]

## 2018-06-24 ENCOUNTER — Encounter (HOSPITAL_COMMUNITY): Payer: Self-pay

## 2018-06-24 ENCOUNTER — Emergency Department (HOSPITAL_COMMUNITY)
Admission: EM | Admit: 2018-06-24 | Discharge: 2018-06-24 | Disposition: A | Discharge status: Home / Self Care | Payer: Self-pay | Attending: Emergency Medicine | Admitting: Emergency Medicine

## 2018-06-24 ENCOUNTER — Other Ambulatory Visit: Payer: Self-pay

## 2018-06-24 DIAGNOSIS — Z87891 Personal history of nicotine dependence: Secondary | ICD-10-CM | POA: Insufficient documentation

## 2018-06-24 DIAGNOSIS — R42 Dizziness and giddiness: Secondary | ICD-10-CM | POA: Insufficient documentation

## 2018-06-24 DIAGNOSIS — Z79899 Other long term (current) drug therapy: Secondary | ICD-10-CM | POA: Insufficient documentation

## 2018-06-24 DIAGNOSIS — R110 Nausea: Secondary | ICD-10-CM | POA: Insufficient documentation

## 2018-06-24 DIAGNOSIS — I10 Essential (primary) hypertension: Secondary | ICD-10-CM | POA: Insufficient documentation

## 2018-06-24 MED ORDER — MECLIZINE HCL 25 MG PO TABS
12.5000 mg | ORAL_TABLET | Freq: Once | ORAL | Status: AC
  Administered 2018-06-24: 12.5 mg via ORAL
  Filled 2018-06-24: qty 1

## 2018-06-24 NOTE — ED Triage Notes (Signed)
Pt came in d/t experiencing dizziness and nauseous that started this AM. Pt took Rx Norvasc Lorazepam PTA to see if it will give relief.

## 2018-06-24 NOTE — ED Provider Notes (Signed)
[facility] EMERGENCY DEPARTMENT Provider Note   CSN: [id number] Arrival date & time: 06/24/18  0321     History   Chief Complaint Chief Complaint  Patient presents with  . Dizziness  . Nausea    HPI Wesley [name] is a 52 y.o. male.  The history is provided by the patient.  Dizziness  Quality:  Head spinning Severity:  Severe Onset quality:  Gradual Timing:  Constant Progression:  Improving (took his ativan) Chronicity:  Recurrent Context: head movement   Relieved by:  Medication Worsened by:  Nothing Ineffective treatments:  Being still Associated symptoms: tinnitus   Associated symptoms: no blood in stool, no chest pain, no headaches, no shortness of breath and no weakness   Risk factors: hx of vertigo   Risk factors: no anemia     Past Medical History:  Diagnosis Date  . Alcohol abuse   . Anxiety   . Anxiety   . Chronic neck pain   . Cocaine abuse (HCC)   . Hypertension   . Prediabetes     Patient Active Problem List   Diagnosis Date Noted  . Legally blind in right eye, as defined in [place] 05/02/2017  . Prediabetes 04/23/2017  . Muscle spasm 04/23/2017  . Essential hypertension 04/16/2017  . H/O medication noncompliance 04/02/2017  . Vitamin D deficiency 04/02/2017  . DJD (degenerative joint disease) of cervical spine 03/16/2017  . 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 Dizziness 01/23/2017  . Anxiety 11/21/2016    Past Surgical History:  Procedure Laterality Date  . EYE SURGERY Right ~ 1980   "hit my eye"        Home Medications    Prior to Admission medications   Medication Sig Start Date End Date Taking? Authorizing Provider  acetaminophen (TYLENOL) 500 MG tablet Take 1,000 mg by mouth daily as needed (back pain).    [provider]  amLODipine (NORVASC) 10 MG tablet Take 1 tablet (10 mg total) by mouth daily. 06/07/18 09/05/18  Elvina Sidle, MD  atorvastatin (LIPITOR) 20 MG tablet Take 1 tablet (20 mg total) by mouth  daily. 02/26/18   Claiborne Rigg, NP  lisinopril (PRINIVIL,ZESTRIL) 5 MG tablet Take 1 tablet (5 mg total) by mouth daily. 04/19/18   Claiborne Rigg, NP  LORazepam (ATIVAN) 0.5 MG tablet Take 1 tablet (0.5 mg total) by mouth 2 (two) times daily. 06/07/18   Elvina Sidle, MD  meclizine (ANTIVERT) 25 MG tablet Take 1 tablet (25 mg total) by mouth 3 (three) times daily as needed for dizziness. 04/24/18   Frederica Kuster, MD  omeprazole (PRILOSEC) 20 MG capsule Take 1 capsule (20 mg total) by mouth daily. 05/28/18   Petrucelli, Pleas Koch, PA-C  promethazine (PHENERGAN) 25 MG tablet Take 1 tablet (25 mg total) by mouth every 8 (eight) hours as needed for nausea or vomiting. 04/22/18   Eustace Moore, MD    Family History Family History  Problem Relation Age of Onset  . Hypertension Mother   . Hypertension Father     Social History Social History   Tobacco Use  . Smoking status: Former Games developer  . Smokeless tobacco: Never Used  Substance Use Topics  . Alcohol use: No    Comment: former  . Drug use: Not Currently    Comment: denies, reports past history      Allergies   Ibuprofen; Other; and Zofran [ondansetron hcl]   Review of Systems Review of Systems  HENT: Positive for tinnitus.  Eyes: Negative for photophobia and visual disturbance.  Respiratory: Negative for shortness of breath.   Cardiovascular: Negative for chest pain.  Gastrointestinal: Negative for blood in stool.  Neurological: Positive for dizziness. Negative for tremors, seizures, syncope, facial asymmetry, speech difficulty, weakness, light-headedness, numbness and headaches.  All other systems reviewed and are negative.    Physical Exam Updated Vital Signs BP (!) 129/93   Pulse 92   Temp 98.1 F (36.7 C) (Oral)   Resp (!) 23   Ht 5\' 6"  (1.676 m)   Wt 90.7 kg   SpO2 97%   BMI 32.28 kg/m   Physical Exam  Constitutional: He appears well-developed and well-nourished. No distress.  HENT:  Head:  Normocephalic and atraumatic.  Mouth/Throat: No oropharyngeal exudate.  Eyes: Pupils are equal, round, and reactive to light. Conjunctivae and EOM are normal.  Neck: Normal range of motion. Neck supple.  Cardiovascular: Normal rate, regular rhythm, normal heart sounds and intact distal pulses.  Pulmonary/Chest: Effort normal and breath sounds normal. No stridor. He has no wheezes. He has no rales.  Abdominal: Soft. Bowel sounds are normal. He exhibits no mass. There is no tenderness. There is no rebound and no guarding.  Musculoskeletal: Normal range of motion.  Neurological: He is alert. He displays normal reflexes. No cranial nerve deficit.  Skin: Skin is warm and dry. Capillary refill takes less than 2 seconds.  Psychiatric: He has a normal mood and affect.     ED Treatments / Results  Labs (all labs ordered are listed, but only abnormal results are displayed) Labs Reviewed - No data to display  EKG None  Radiology No results found.  Procedures Procedures (including critical care time)  Medications Ordered in ED Medications  meclizine (ANTIVERT) tablet 12.5 mg (has no administration in time range)       Final Clinical Impressions(s) / ED Diagnoses   Return for weakness, numbness, changes in vision or speech, fevers >100.4 unrelieved by medication, shortness of breath, intractable vomiting, or diarrhea, abdominal pain, Inability to tolerate liquids or food, cough, altered mental status or any concerns. No signs of systemic illness or infection. The patient is nontoxic-appearing on exam and vital signs are within normal limits.    I have reviewed the triage vital signs and the nursing notes. Pertinent labs &imaging results that were available during my care of the patient were reviewed by me and considered in my medical decision making (see chart for details).  After history, exam, and medical workup I feel the patient has been appropriately medically screened and is  safe for discharge home. Pertinent diagnoses were discussed with the patient. Patient was given return precautions.   Jasreet Dickie, MD 06/24/18 1610

## 2018-06-27 ENCOUNTER — Ambulatory Visit: Payer: Self-pay

## 2018-06-27 ENCOUNTER — Other Ambulatory Visit: Payer: Self-pay | Admitting: Pharmacist

## 2018-06-27 DIAGNOSIS — I10 Essential (primary) hypertension: Secondary | ICD-10-CM

## 2018-06-27 MED ORDER — AMLODIPINE BESYLATE 10 MG PO TABS: 10.0000 mg | ORAL_TABLET | Freq: Every day | ORAL | 0 refills | Status: DC

## 2018-06-28 ENCOUNTER — Encounter (HOSPITAL_COMMUNITY): Payer: Self-pay | Admitting: Emergency Medicine

## 2018-06-28 ENCOUNTER — Ambulatory Visit (HOSPITAL_COMMUNITY)
Admission: EM | Admit: 2018-06-28 | Discharge: 2018-06-28 | Disposition: A | Discharge status: Home / Self Care | Payer: Self-pay | Attending: Family Medicine | Admitting: Family Medicine

## 2018-06-28 DIAGNOSIS — F411 Generalized anxiety disorder: Secondary | ICD-10-CM

## 2018-06-28 DIAGNOSIS — F5101 Primary insomnia: Secondary | ICD-10-CM

## 2018-06-28 DIAGNOSIS — R42 Dizziness and giddiness: Secondary | ICD-10-CM

## 2018-06-28 MED ORDER — MECLIZINE HCL 25 MG PO TABS: 25.0000 mg | ORAL_TABLET | Freq: Three times a day (TID) | ORAL | 5 refills | Status: DC | PRN

## 2018-06-28 MED ORDER — QUETIAPINE FUMARATE 25 MG PO TABS: 25.0000 mg | ORAL_TABLET | Freq: Every day | ORAL | 0 refills | Status: DC

## 2018-06-28 NOTE — ED Triage Notes (Signed)
Pt here for chronic dizziness

## 2018-06-28 NOTE — Discharge Instructions (Signed)
Please come back October 31 after 6 pm

## 2018-06-28 NOTE — ED Provider Notes (Signed)
[facility]    CSN: [id number] Arrival date & time: 06/28/18  1914     History   Chief Complaint Chief Complaint  Patient presents with  . Dizziness    HPI Wesley [name] is a 52 y.o. male.   This is a 52 year old man with generalized anxiety disorder who presents with multiple complaints.  He does note that his epigastric and xiphoid pain is gone now.  He has had insomnia last 4 nights he has intermittent dizziness chronically.  He would like a refill on his meclizine.  He has no new headache, fever, nausea, vomiting, shortness of breath, stiff neck or other signs of serious disease.  He has been seen multiple times in the emergency room and urgent care setting.     Past Medical History:  Diagnosis Date  . Alcohol abuse   . Anxiety   . Anxiety   . Chronic neck pain   . Cocaine abuse (HCC)   . Hypertension   . Prediabetes     Patient Active Problem List   Diagnosis Date Noted  . Legally blind in right eye, as defined in [place] 05/02/2017  . Prediabetes 04/23/2017  . Muscle spasm 04/23/2017  . Essential hypertension 04/16/2017  . H/O medication noncompliance 04/02/2017  . Vitamin D deficiency 04/02/2017  . DJD (degenerative joint disease) of cervical spine 03/16/2017  . 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 Dizziness 01/23/2017  . Anxiety 11/21/2016    Past Surgical History:  Procedure Laterality Date  . EYE SURGERY Right ~ 1980   "hit my eye"       Home Medications    Prior to Admission medications   Medication Sig Start Date End Date Taking? Authorizing Provider  acetaminophen (TYLENOL) 500 MG tablet Take 1,000 mg by mouth daily as needed (back pain).    [provider]  amLODipine (NORVASC) 10 MG tablet Take 1 tablet (10 mg total) by mouth daily. 06/27/18 09/25/18  Hoy Register, MD  atorvastatin (LIPITOR) 20 MG tablet Take 1 tablet (20 mg total) by mouth daily. 02/26/18   Claiborne Rigg, NP  lisinopril (PRINIVIL,ZESTRIL) 5 MG tablet Take 1  tablet (5 mg total) by mouth daily. 04/19/18   Claiborne Rigg, NP  LORazepam (ATIVAN) 0.5 MG tablet Take 1 tablet (0.5 mg total) by mouth 2 (two) times daily. 06/07/18   Elvina Sidle, MD  meclizine (ANTIVERT) 25 MG tablet Take 1 tablet (25 mg total) by mouth 3 (three) times daily as needed for dizziness. 06/28/18   Elvina Sidle, MD  omeprazole (PRILOSEC) 20 MG capsule Take 1 capsule (20 mg total) by mouth daily. 05/28/18   Petrucelli, Samantha R, PA-C  QUEtiapine (SEROQUEL) 25 MG tablet Take 1 tablet (25 mg total) by mouth at bedtime. 06/28/18   Elvina Sidle, MD    Family History Family History  Problem Relation Age of Onset  . Hypertension Mother   . Hypertension Father     Social History Social History   Tobacco Use  . Smoking status: Former Games developer  . Smokeless tobacco: Never Used  Substance Use Topics  . Alcohol use: No    Comment: former  . Drug use: Not Currently    Comment: denies, reports past history      Allergies   Ibuprofen; Other; and Zofran [ondansetron hcl]   Review of Systems Review of Systems  Eyes: Positive for visual disturbance.  Psychiatric/Behavioral: Positive for agitation.     Physical Exam Triage Vital Signs ED Triage Vitals  Enc Vitals Group  BP 06/28/18 1036 133/72     Pulse Rate 06/28/18 1036 92     Resp 06/28/18 1036 18     Temp 06/28/18 1036 98.4 F (36.9 C)     Temp src --      SpO2 06/28/18 1036 99 %     Weight --      Height --      Head Circumference --      Peak Flow --      Pain Score 06/28/18 1037 0     Pain Loc --      Pain Edu? --      Excl. in GC? --    No data found.  Updated Vital Signs BP 133/72   Pulse 92   Temp 98.4 F (36.9 C)   Resp 18   SpO2 99%   Visual Acuity Right Eye Distance:   Left Eye Distance:   Bilateral Distance:    Right Eye Near:   Left Eye Near:    Bilateral Near:     Physical Exam  Constitutional: He is oriented to person, place, and time. He appears well-developed  and well-nourished.  HENT:  Right Ear: External ear normal.  Left Ear: External ear normal.  Mouth/Throat: Oropharynx is clear and moist.  Eyes: Right eye exhibits discharge. Left eye exhibits discharge.  Right eye shows asymmetric pupil and patient states that he is blind in that eye.  He has a small amount of yellow crusting in the medial canthi of each eye.  Neck: Normal range of motion. Neck supple.  Cardiovascular: Normal rate, regular rhythm and normal heart sounds.  Pulmonary/Chest: Effort normal and breath sounds normal.  Abdominal: Soft. Bowel sounds are normal. There is no tenderness.  Musculoskeletal: Normal range of motion.  Neurological: He is alert and oriented to person, place, and time.  Skin: Skin is warm and dry.  Nursing note and vitals reviewed.    UC Treatments / Results  Labs (all labs ordered are listed, but only abnormal results are displayed) Labs Reviewed - No data to display  EKG None  Radiology No results found.  Procedures Procedures (including critical care time)  Medications Ordered in UC Medications - No data to display  Initial Impression / Assessment and Plan / UC Course  I have reviewed the triage vital signs and the nursing notes.  Pertinent labs & imaging results that were available during my care of the patient were reviewed by me and considered in my medical decision making (see chart for details).    Final Clinical Impressions(s) / UC Diagnoses   Final diagnoses:  Primary insomnia  Generalized anxiety disorder     Discharge Instructions     Please come back October 31 after 6 pm   ED Prescriptions    Medication Sig Dispense Auth. Provider   QUEtiapine (SEROQUEL) 25 MG tablet  (Status: Discontinued) Take 1 tablet (25 mg total) by mouth at bedtime. 30 tablet Elvina Sidle, MD   meclizine (ANTIVERT) 25 MG tablet Take 1 tablet (25 mg total) by mouth 3 (three) times daily as needed for dizziness. 30 tablet Elvina Sidle, MD   QUEtiapine (SEROQUEL) 25 MG tablet Take 1 tablet (25 mg total) by mouth at bedtime. 30 tablet Elvina Sidle, MD     Controlled Substance Prescriptions [place] Controlled Substance Registry consulted? Not Applicable   Elvina Sidle, MD 06/28/18 1103

## 2018-07-08 ENCOUNTER — Ambulatory Visit (HOSPITAL_COMMUNITY)
Admission: EM | Admit: 2018-07-08 | Discharge: 2018-07-08 | Disposition: A | Discharge status: Home / Self Care | Payer: Self-pay | Attending: Family Medicine | Admitting: Family Medicine

## 2018-07-08 ENCOUNTER — Encounter (HOSPITAL_COMMUNITY): Payer: Self-pay

## 2018-07-08 ENCOUNTER — Other Ambulatory Visit: Payer: Self-pay

## 2018-07-08 DIAGNOSIS — M7918 Myalgia, other site: Secondary | ICD-10-CM

## 2018-07-08 DIAGNOSIS — H1031 Unspecified acute conjunctivitis, right eye: Secondary | ICD-10-CM

## 2018-07-08 DIAGNOSIS — M791 Myalgia, unspecified site: Secondary | ICD-10-CM

## 2018-07-08 DIAGNOSIS — I10 Essential (primary) hypertension: Secondary | ICD-10-CM

## 2018-07-08 MED ORDER — TRIAMTERENE-HCTZ 37.5-25 MG PO TABS: 1.0000 | ORAL_TABLET | Freq: Every day | ORAL | 1 refills | Status: DC

## 2018-07-08 MED ORDER — TOBRAMYCIN 0.3 % OP SOLN: 1.0000 [drp] | OPHTHALMIC | 0 refills | Status: DC

## 2018-07-08 NOTE — ED Triage Notes (Signed)
Pt c/o b/p has been elevated for 2 hours. Pt feels pain his in neck on the right side. Pt states his right eye is infected x 2 weeks.

## 2018-07-08 NOTE — ED Provider Notes (Signed)
[facility]    CSN: [id number] Arrival date & time: 07/08/18  1604     History   Chief Complaint Chief Complaint  Patient presents with  . Hypertension    HPI Wesley [name] is a 52 y.o. male.   Pt c/o b/p has been elevated for 2 hours. Pt feels pain his in neck on the right side. Pt states his right eye is infected x 2 weeks.  Is also concerned about his blood pressure being up an hour ago.  He is being seen with his wife.  He has some muscle spasm in between his shoulder blades which she like evaluated as well.       Past Medical History:  Diagnosis Date  . Alcohol abuse   . Anxiety   . Anxiety   . Chronic neck pain   . Cocaine abuse (HCC)   . Hypertension   . Prediabetes     Patient Active Problem List   Diagnosis Date Noted  . Legally blind in right eye, as defined in [place] 05/02/2017  . Prediabetes 04/23/2017  . Muscle spasm 04/23/2017  . Essential hypertension 04/16/2017  . H/O medication noncompliance 04/02/2017  . Vitamin D deficiency 04/02/2017  . DJD (degenerative joint disease) of cervical spine 03/16/2017  . 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 Dizziness 01/23/2017  . Anxiety 11/21/2016    Past Surgical History:  Procedure Laterality Date  . EYE SURGERY Right ~ 1980   "hit my eye"       Home Medications    Prior to Admission medications   Medication Sig Start Date End Date Taking? Authorizing Provider  acetaminophen (TYLENOL) 500 MG tablet Take 1,000 mg by mouth daily as needed (back pain).    [provider]  amLODipine (NORVASC) 10 MG tablet Take 1 tablet (10 mg total) by mouth daily. 06/27/18 09/25/18  Hoy Register, MD  atorvastatin (LIPITOR) 20 MG tablet Take 1 tablet (20 mg total) by mouth daily. 02/26/18   Claiborne Rigg, NP  lisinopril (PRINIVIL,ZESTRIL) 5 MG tablet Take 1 tablet (5 mg total) by mouth daily. 04/19/18   Claiborne Rigg, NP  LORazepam (ATIVAN) 0.5 MG tablet Take 1 tablet (0.5 mg total) by mouth 2 (two)  times daily. 06/07/18   Elvina Sidle, MD  meclizine (ANTIVERT) 25 MG tablet Take 1 tablet (25 mg total) by mouth 3 (three) times daily as needed for dizziness. 06/28/18   Elvina Sidle, MD  omeprazole (PRILOSEC) 20 MG capsule Take 1 capsule (20 mg total) by mouth daily. 05/28/18   Petrucelli, Samantha R, PA-C  QUEtiapine (SEROQUEL) 25 MG tablet Take 1 tablet (25 mg total) by mouth at bedtime. 06/28/18   Elvina Sidle, MD  tobramycin (TOBREX) 0.3 % ophthalmic solution Place 1 drop into the right eye every 4 (four) hours. 07/08/18   Elvina Sidle, MD  triamterene-hydrochlorothiazide (MAXZIDE-25) 37.5-25 MG tablet Take 1 tablet by mouth daily. 07/08/18   Elvina Sidle, MD    Family History Family History  Problem Relation Age of Onset  . Hypertension Mother   . Hypertension Father     Social History Social History   Tobacco Use  . Smoking status: Former Games developer  . Smokeless tobacco: Never Used  Substance Use Topics  . Alcohol use: No    Comment: former  . Drug use: Not Currently    Comment: denies, reports past history      Allergies   Ibuprofen; Other; and Zofran [ondansetron hcl]   Review of Systems Review of Systems  HENT: Negative.   Eyes: Positive for discharge.  Musculoskeletal: Positive for myalgias.  Neurological: Positive for tremors.  All other systems reviewed and are negative.    Physical Exam Triage Vital Signs ED Triage Vitals  Enc Vitals Group     BP 07/08/18 1618 (!) 143/75     Pulse Rate 07/08/18 1618 100     Resp 07/08/18 1618 18     Temp 07/08/18 1618 98.4 F (36.9 C)     Temp Source 07/08/18 1618 Oral     SpO2 07/08/18 1618 100 %     Weight 07/08/18 1617 205 lb (93 kg)     Height --      Head Circumference --      Peak Flow --      Pain Score 07/08/18 1617 7     Pain Loc --      Pain Edu? --      Excl. in GC? --    No data found.  Updated Vital Signs BP (!) 143/75 (BP Location: Right Arm)   Pulse 100   Temp 98.4 F  (36.9 C) (Oral)   Resp 18   Wt 93 kg   SpO2 100%   BMI 33.09 kg/m   Physical Exam  Constitutional: He is oriented to person, place, and time. He appears well-developed and well-nourished.  HENT:  Right Ear: External ear normal.  Left Ear: External ear normal.  Mouth/Throat: Oropharynx is clear and moist.  Eyes: Right eye exhibits discharge. Left eye exhibits no discharge.  Neck: Normal range of motion. Neck supple.  Cardiovascular: Normal rate, regular rhythm and normal heart sounds.  Pulmonary/Chest: Effort normal.  Musculoskeletal: Normal range of motion. He exhibits tenderness.  Tender interscapular muscles  Neurological: He is alert and oriented to person, place, and time.  Skin: Skin is warm and dry.  Nursing note and vitals reviewed.    UC Treatments / Results  Labs (all labs ordered are listed, but only abnormal results are displayed) Labs Reviewed - No data to display  EKG None  Radiology No results found.  Procedures Procedures (including critical care time)  Medications Ordered in UC Medications - No data to display  Initial Impression / Assessment and Plan / UC Course  I have reviewed the triage vital signs and the nursing notes.  Pertinent labs & imaging results that were available during my care of the patient were reviewed by me and considered in my medical decision making (see chart for details).    Final Clinical Impressions(s) / UC Diagnoses   Final diagnoses:  Acute bacterial conjunctivitis of right eye  Essential hypertension  Myalgia   Discharge Instructions   None    ED Prescriptions    Medication Sig Dispense Auth. Provider   tobramycin (TOBREX) 0.3 % ophthalmic solution Place 1 drop into the right eye every 4 (four) hours. 5 mL Elvina Sidle, MD   triamterene-hydrochlorothiazide (MAXZIDE-25) 37.5-25 MG tablet Take 1 tablet by mouth daily. 90 tablet Elvina Sidle, MD     Controlled Substance Prescriptions [place] Controlled  Substance Registry consulted? Not Applicable   Elvina Sidle, MD 07/08/18 [phone]

## 2018-07-09 ENCOUNTER — Telehealth: Payer: Self-pay | Admitting: Nurse Practitioner

## 2018-07-09 NOTE — Telephone Encounter (Signed)
1) Medication(s) Requested (by name):atorvastatin (LIPITOR) 20 MG tablet    2) Pharmacy of Choice:[id number] - [place], [place] - [address]  3) Special Requests:   Approved medications will be sent to the pharmacy, we will reach out if there is an issue.  Requests made after 3pm may not be addressed until the following business day!  If a patient is unsure of the name of the medication(s) please note and ask patient to call back when they are able to provide all info, do not send to responsible party until all information is available!

## 2018-07-10 NOTE — Telephone Encounter (Signed)
His current RX is at our pharmacy, if he would like it transferred he can call [facility] to initiate the transfer, if he has no insurance it may be more costly at [facility], he may want to consider continuing to use our pharmacy or possibly [facility] who also offer low prices.

## 2018-07-13 ENCOUNTER — Encounter (HOSPITAL_COMMUNITY): Payer: Self-pay

## 2018-07-13 ENCOUNTER — Other Ambulatory Visit: Payer: Self-pay

## 2018-07-13 ENCOUNTER — Emergency Department (HOSPITAL_COMMUNITY)
Admission: EM | Admit: 2018-07-13 | Discharge: 2018-07-14 | Discharge status: Against Medical Advice | Payer: Self-pay | Attending: Emergency Medicine | Admitting: Emergency Medicine

## 2018-07-13 DIAGNOSIS — I10 Essential (primary) hypertension: Secondary | ICD-10-CM | POA: Insufficient documentation

## 2018-07-13 DIAGNOSIS — G8929 Other chronic pain: Secondary | ICD-10-CM | POA: Insufficient documentation

## 2018-07-13 DIAGNOSIS — R42 Dizziness and giddiness: Secondary | ICD-10-CM | POA: Insufficient documentation

## 2018-07-13 DIAGNOSIS — Z5320 Procedure and treatment not carried out because of patient's decision for unspecified reasons: Secondary | ICD-10-CM | POA: Insufficient documentation

## 2018-07-13 DIAGNOSIS — M542 Cervicalgia: Secondary | ICD-10-CM | POA: Insufficient documentation

## 2018-07-13 NOTE — ED Triage Notes (Addendum)
Pt reports of elevated BP for 3 hours. Also endorses headache and dizziness. Extensive hx of same. No focal neuro deficits.

## 2018-07-14 NOTE — Discharge Instructions (Signed)
1. Medications: usual home medications 2. Treatment: rest, drink plenty of fluids,  3. Follow Up: Please followup with your primary doctor in 2-3 days for discussion of your diagnoses and further evaluation after today's visit; if you do not have a primary care doctor use the resource guide provided to find one; Please return to the ER for new or worsening symptoms  

## 2018-07-14 NOTE — ED Notes (Signed)
Pt left w/o informing this RN.  EDP aware.  Will d/c AMA.

## 2018-07-14 NOTE — ED Provider Notes (Signed)
[facility]-EMERGENCY DEPT Provider Note   CSN: [id number] Arrival date & time: 07/13/18  2255     History   Chief Complaint Chief Complaint  Patient presents with  . Hypertension    HPI Wesley [name] is a 52 y.o. male with a hx of EtOH abuse, cocaine abuse, chronic neck pain, HTN, anxiety presents to the Emergency Department complaining of gradual now resolved lightheadedness onset 9pm.  Pt described this as lightheaded and not as room spinning.  He reports associated brief "tunnel vision" that resolved.  He denies syncope.  Pt reports he took meclizine without relief.  He reports he then layed down and the symptoms resolved completely.  Pt reports during this episode he took his BP and found it to be 160110/.   Associated symptoms include chronic posterior headache and neck pain. He had no vision changes, numbness, tingling, weakness, confusion, palpitations or gait problems during the episode.  Pt denies recent illness.  Pt reports concern for his BP and CBG.  Pt reports hx of vertigo, but toady seemed some different.    The history is provided by the patient and medical records. No language interpreter was used.    Past Medical History:  Diagnosis Date  . Alcohol abuse   . Anxiety   . Anxiety   . Chronic neck pain   . Cocaine abuse (HCC)   . Hypertension   . Prediabetes     Patient Active Problem List   Diagnosis Date Noted  . Legally blind in right eye, as defined in [place] 05/02/2017  . Prediabetes 04/23/2017  . Muscle spasm 04/23/2017  . Essential hypertension 04/16/2017  . H/O medication noncompliance 04/02/2017  . Vitamin D deficiency 04/02/2017  . DJD (degenerative joint disease) of cervical spine 03/16/2017  . 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 Dizziness 01/23/2017  . Anxiety 11/21/2016    Past Surgical History:  Procedure Laterality Date  . EYE SURGERY Right ~ 1980   "hit my eye"        Home Medications    Prior to Admission medications     Medication Sig Start Date End Date Taking? Authorizing Provider  acetaminophen (TYLENOL) 500 MG tablet Take 1,000 mg by mouth daily as needed (back pain).    [provider]  amLODipine (NORVASC) 10 MG tablet Take 1 tablet (10 mg total) by mouth daily. 06/27/18 09/25/18  Hoy Register, MD  atorvastatin (LIPITOR) 20 MG tablet Take 1 tablet (20 mg total) by mouth daily. 02/26/18   Claiborne Rigg, NP  lisinopril (PRINIVIL,ZESTRIL) 5 MG tablet Take 1 tablet (5 mg total) by mouth daily. 04/19/18   Claiborne Rigg, NP  LORazepam (ATIVAN) 0.5 MG tablet Take 1 tablet (0.5 mg total) by mouth 2 (two) times daily. 06/07/18   Elvina Sidle, MD  meclizine (ANTIVERT) 25 MG tablet Take 1 tablet (25 mg total) by mouth 3 (three) times daily as needed for dizziness. 06/28/18   Elvina Sidle, MD  omeprazole (PRILOSEC) 20 MG capsule Take 1 capsule (20 mg total) by mouth daily. 05/28/18   Petrucelli, Samantha R, PA-C  QUEtiapine (SEROQUEL) 25 MG tablet Take 1 tablet (25 mg total) by mouth at bedtime. 06/28/18   Elvina Sidle, MD  tobramycin (TOBREX) 0.3 % ophthalmic solution Place 1 drop into the right eye every 4 (four) hours. 07/08/18   Elvina Sidle, MD  triamterene-hydrochlorothiazide (MAXZIDE-25) 37.5-25 MG tablet Take 1 tablet by mouth daily. 07/08/18   Elvina Sidle, MD    Family History Family History  Problem Relation Age of Onset  . Hypertension Mother   . Hypertension Father     Social History Social History   Tobacco Use  . Smoking status: Former Games developer  . Smokeless tobacco: Never Used  Substance Use Topics  . Alcohol use: No    Comment: former  . Drug use: Not Currently    Comment: denies, reports past history      Allergies   Ibuprofen; Other; and Zofran [ondansetron hcl]   Review of Systems Review of Systems  Constitutional: Negative for appetite change, diaphoresis, fatigue, fever and unexpected weight change.  HENT: Negative for mouth sores.   Eyes:  Positive for visual disturbance.  Respiratory: Negative for cough, chest tightness, shortness of breath and wheezing.   Cardiovascular: Negative for chest pain.  Gastrointestinal: Negative for abdominal pain, constipation, diarrhea, nausea and vomiting.  Endocrine: Negative for polydipsia, polyphagia and polyuria.  Genitourinary: Negative for dysuria, frequency, hematuria and urgency.  Musculoskeletal: Positive for neck pain (chronic). Negative for back pain and neck stiffness.  Skin: Negative for rash.  Allergic/Immunologic: Negative for immunocompromised state.  Neurological: Positive for light-headedness ( resolved) and headaches ( chronic, posterior). Negative for syncope.  Hematological: Does not bruise/bleed easily.  Psychiatric/Behavioral: Negative for sleep disturbance. The patient is not nervous/anxious.      Physical Exam Updated Vital Signs BP (!) 129/99 (BP Location: Right Arm)   Pulse 93   Temp 97.6 F (36.4 C) (Oral)   Resp 16   SpO2 98%   Physical Exam  Constitutional: He is oriented to person, place, and time. He appears well-developed and well-nourished. No distress.  HENT:  Head: Normocephalic and atraumatic.  Mouth/Throat: Mucous membranes are normal.  Eyes: Pupils are equal, round, and reactive to light. EOM are normal. Right conjunctiva is injected. No scleral icterus.  No nystagmus  Neck: Normal range of motion. Neck supple.  Full active and passive ROM without pain No midline tenderness No nuchal rigidity or meningeal signs TTP along the bilateral paraspinal muscles and trapezius  Cardiovascular: Normal rate, regular rhythm and intact distal pulses.  Pulmonary/Chest: Effort normal and breath sounds normal. No respiratory distress. He has no wheezes. He has no rales.  Abdominal: Soft. Bowel sounds are normal. There is no tenderness. There is no rebound and no guarding.  Musculoskeletal: Normal range of motion.  Neurological: He is alert and oriented to  person, place, and time. No cranial nerve deficit. He exhibits normal muscle tone. Coordination normal.  Mental Status:  Alert, oriented, thought content appropriate. Speech fluent without evidence of aphasia. Able to follow 2 step commands without difficulty.  Cranial Nerves:  II:  pupils equal, round, reactive to light III,IV, VI: ptosis not present, extra-ocular motions intact bilaterally  V,VII: smile symmetric, facial light touch sensation equal VIII: hearing grossly normal bilaterally  IX,X: midline uvula rise  XI: bilateral shoulder shrug equal and strong XII: midline tongue extension  Motor:  5/5 in upper and lower extremities bilaterally including strong and equal grip strength and dorsiflexion/plantar flexion Sensory: light touch normal in all extremities.  CV: distal pulses palpable throughout   Skin: Skin is warm and dry. No rash noted. He is not diaphoretic.  Psychiatric: He has a normal mood and affect. His behavior is normal. Judgment and thought content normal.  Nursing note and vitals reviewed.    ED Treatments / Results   Procedures Procedures (including critical care time)  Medications Ordered in ED Medications - No data to display   Initial Impression /  Assessment and Plan / ED Course  I have reviewed the triage vital signs and the nursing notes.  Pertinent labs & imaging results that were available during my care of the patient were reviewed by me and considered in my medical decision making (see chart for details).  Clinical Course as of Jul 14 140  Sun Jul 14, 2018  0141 BP improved during visit without intervention  BP(!): 129/99 [HM]    Clinical Course User Index [HM] Muthersbaugh, Dahlia Client, PA-C    Pt presents with c/o lightheadedness.  Pt well known to this department for similar complaints.  Hx and physical exam was performed by PA student and physical exam was observed by me.  Student and I discussed the patient and made a plan.  ECG, CBG and  orthostatics ordered, but patient eloped from the department prior to obtaining these or my discussion with the patient about the plan.  Observed physical exam showed a normal neuro exam and Hx obtained by the student was without red flags for CVA.  RN reports pt ambulates with steady gait.    Final Clinical Impressions(s) / ED Diagnoses   Final diagnoses:  Hypertension, unspecified type  Chronic neck pain  Lightheadedness    ED Discharge Orders    None       Milta Deiters 07/14/18 0141    Gilda Crease, MD 07/14/18 [phone]

## 2018-07-17 ENCOUNTER — Other Ambulatory Visit: Payer: Self-pay

## 2018-07-17 ENCOUNTER — Ambulatory Visit (HOSPITAL_COMMUNITY)
Admission: EM | Admit: 2018-07-17 | Discharge: 2018-07-17 | Disposition: A | Discharge status: Home / Self Care | Payer: Self-pay | Attending: Family Medicine | Admitting: Family Medicine

## 2018-07-17 ENCOUNTER — Encounter (HOSPITAL_COMMUNITY): Payer: Self-pay | Admitting: Emergency Medicine

## 2018-07-17 DIAGNOSIS — I10 Essential (primary) hypertension: Secondary | ICD-10-CM

## 2018-07-17 DIAGNOSIS — H538 Other visual disturbances: Secondary | ICD-10-CM

## 2018-07-17 DIAGNOSIS — H1031 Unspecified acute conjunctivitis, right eye: Secondary | ICD-10-CM

## 2018-07-17 LAB — GLUCOSE, CAPILLARY: Glucose-Capillary: 102 mg/dL — ABNORMAL HIGH (ref 70–99)

## 2018-07-17 MED ORDER — OLOPATADINE HCL 0.2 % OP SOLN: 1.0000 [drp] | Freq: Every day | OPHTHALMIC | 0 refills | Status: DC

## 2018-07-17 NOTE — ED Triage Notes (Signed)
Patient report head is hot and feels dizzy.  Symptoms started 3 hours ago

## 2018-07-18 ENCOUNTER — Ambulatory Visit (HOSPITAL_COMMUNITY)
Admission: EM | Admit: 2018-07-18 | Discharge: 2018-07-18 | Disposition: A | Discharge status: Home / Self Care | Payer: Self-pay | Attending: Internal Medicine | Admitting: Internal Medicine

## 2018-07-18 ENCOUNTER — Encounter (HOSPITAL_COMMUNITY): Payer: Self-pay | Admitting: Emergency Medicine

## 2018-07-18 DIAGNOSIS — R6889 Other general symptoms and signs: Secondary | ICD-10-CM

## 2018-07-18 MED ORDER — CYCLOBENZAPRINE HCL 10 MG PO TABS: 10.0000 mg | ORAL_TABLET | Freq: Two times a day (BID) | ORAL | 0 refills | Status: DC | PRN

## 2018-07-18 MED ORDER — NAPROXEN 500 MG PO TABS: 500.0000 mg | ORAL_TABLET | Freq: Two times a day (BID) | ORAL | 0 refills | Status: DC

## 2018-07-18 NOTE — ED Triage Notes (Signed)
Pt c/o head feeling hot and dizziness. Seen here yesterday for the same.

## 2018-07-18 NOTE — Discharge Instructions (Signed)
Please use Tylenol or Naprosyn twice daily for headache and neck pain I have refilled your flexeril You may use flexeril as needed to help with pain. This is a muscle relaxer and causes sedation- please use only at bedtime or when you will be home and not have to drive/work  Work on diet and exercise  Please establish care with primary care at [facility] for further workup and management of your blood pressure and cholesterol

## 2018-07-18 NOTE — ED Provider Notes (Signed)
[facility]    CSN: [id number] Arrival date & time: 07/18/18  1744     History   Chief Complaint Chief Complaint  Patient presents with  . Dizziness    HPI Mikhai Bienvenue is a 52 y.o. male history of hypertension, prediabetes, anxiety, presenting today for evaluation of sensation of feeling hot.  Patient states that over the past couple hours he has had a sensation of his face being hot.  He denies associated changes in vision.  Denies sensation of syncope.  Denies chest pain or shortness of breath.  States that this is happened a couple times before.  This is similar to his usual dizziness spells.  It initially started as he was eating a piece of chicken.  He is concerned that this is a symptom of high cholesterol.  Is wanting to have his cholesterol checked.  HPI  Past Medical History:  Diagnosis Date  . Alcohol abuse   . Anxiety   . Anxiety   . Chronic neck pain   . Cocaine abuse (HCC)   . Hypertension   . Prediabetes     Patient Active Problem List   Diagnosis Date Noted  . Legally blind in right eye, as defined in [place] 05/02/2017  . Prediabetes 04/23/2017  . Muscle spasm 04/23/2017  . Essential hypertension 04/16/2017  . H/O medication noncompliance 04/02/2017  . Vitamin D deficiency 04/02/2017  . DJD (degenerative joint disease) of cervical spine 03/16/2017  . 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 Dizziness 01/23/2017  . Anxiety 11/21/2016    Past Surgical History:  Procedure Laterality Date  . EYE SURGERY Right ~ 1980   "hit my eye"       Home Medications    Prior to Admission medications   Medication Sig Start Date End Date Taking? Authorizing Provider  acetaminophen (TYLENOL) 500 MG tablet Take 1,000 mg by mouth daily as needed (back pain).    [provider]  amLODipine (NORVASC) 10 MG tablet Take 1 tablet (10 mg total) by mouth daily. 06/27/18 09/25/18  Hoy Register, MD  atorvastatin (LIPITOR) 20 MG tablet Take 1 tablet (20 mg total) by  mouth daily. 02/26/18   Claiborne Rigg, NP  cyclobenzaprine (FLEXERIL) 10 MG tablet Take 1 tablet (10 mg total) by mouth 2 (two) times daily as needed for muscle spasms. 07/18/18   Wieters, Hallie C, PA-C  lisinopril (PRINIVIL,ZESTRIL) 5 MG tablet Take 1 tablet (5 mg total) by mouth daily. 04/19/18   Claiborne Rigg, NP  LORazepam (ATIVAN) 0.5 MG tablet Take 1 tablet (0.5 mg total) by mouth 2 (two) times daily. 06/07/18   Elvina Sidle, MD  meclizine (ANTIVERT) 25 MG tablet Take 1 tablet (25 mg total) by mouth 3 (three) times daily as needed for dizziness. 06/28/18   Elvina Sidle, MD  naproxen (NAPROSYN) 500 MG tablet Take 1 tablet (500 mg total) by mouth 2 (two) times daily. 07/18/18   Wieters, Hallie C, PA-C  Olopatadine HCl 0.2 % SOLN Apply 1 drop to eye daily. 07/17/18   Mardella Layman, MD  omeprazole (PRILOSEC) 20 MG capsule Take 1 capsule (20 mg total) by mouth daily. 05/28/18   Petrucelli, Samantha R, PA-C  QUEtiapine (SEROQUEL) 25 MG tablet Take 1 tablet (25 mg total) by mouth at bedtime. 06/28/18   Elvina Sidle, MD  tobramycin (TOBREX) 0.3 % ophthalmic solution Place 1 drop into the right eye every 4 (four) hours. 07/08/18   Elvina Sidle, MD  triamterene-hydrochlorothiazide (MAXZIDE-25) 37.5-25 MG tablet Take 1 tablet  by mouth daily. 07/08/18   Elvina Sidle, MD    Family History Family History  Problem Relation Age of Onset  . Hypertension Mother   . Hypertension Father     Social History Social History   Tobacco Use  . Smoking status: Former Games developer  . Smokeless tobacco: Never Used  Substance Use Topics  . Alcohol use: No    Comment: former  . Drug use: Not Currently    Comment: denies, reports past history      Allergies   Ibuprofen; Other; and Zofran [ondansetron hcl]   Review of Systems Review of Systems  Constitutional: Negative for activity change, appetite change, chills, fatigue and fever.  HENT: Negative for congestion, ear pain, rhinorrhea,  sinus pressure, sore throat and trouble swallowing.   Eyes: Negative for discharge and redness.  Respiratory: Positive for cough. Negative for chest tightness and shortness of breath.   Cardiovascular: Negative for chest pain.  Gastrointestinal: Negative for abdominal pain, diarrhea, nausea and vomiting.  Musculoskeletal: Positive for neck pain. Negative for myalgias.  Skin: Negative for rash.  Neurological: Negative for dizziness, light-headedness and headaches.     Physical Exam Triage Vital Signs ED Triage Vitals [07/18/18 1804]  Enc Vitals Group     BP 131/82     Pulse Rate 91     Resp 18     Temp 98.2 F (36.8 C)     Temp src      SpO2 98 %     Weight      Height      Head Circumference      Peak Flow      Pain Score 0     Pain Loc      Pain Edu?      Excl. in GC?    No data found.  Updated Vital Signs BP 131/82   Pulse 91   Temp 98.2 F (36.8 C)   Resp 18   SpO2 98%   Visual Acuity Right Eye Distance:   Left Eye Distance:   Bilateral Distance:    Right Eye Near:   Left Eye Near:    Bilateral Near:     Physical Exam  Constitutional: He is oriented to person, place, and time. He appears well-developed and well-nourished.  No acute distress  HENT:  Head: Normocephalic and atraumatic.  Nose: Nose normal.  Mouth/Throat: Oropharynx is clear and moist.  Oral mucosa pink and moist, no tonsillar enlargement or exudate. Posterior pharynx patent and nonerythematous, no uvula deviation or swelling. Normal phonation.  Bilateral ears without tenderness to palpation of external auricle, tragus and mastoid, EAC's without erythema or swelling, TM's with good bony landmarks and cone of light. Non erythematous.  Eyes: Conjunctivae are normal.  Left pupil irregular and fixed, not new  Right pupil regular and reactive to light  Neck: Neck supple.  Cardiovascular: Normal rate.  Pulmonary/Chest: Effort normal. No respiratory distress.  Breathing comfortably at rest,  CTABL, no wheezing, rales or other adventitious sounds auscultated  Abdominal: He exhibits no distension.  Musculoskeletal: Normal range of motion.  Tenderness throughout bilateral trapezius musculature, no midline tenderness  Neurological: He is alert and oriented to person, place, and time.  Skin: Skin is warm and dry.  Psychiatric: He has a normal mood and affect.  Nursing note and vitals reviewed.    UC Treatments / Results  Labs (all labs ordered are listed, but only abnormal results are displayed) Labs Reviewed - No data to display  EKG  None  Radiology No results found.  Procedures Procedures (including critical care time)  Medications Ordered in UC Medications - No data to display  Initial Impression / Assessment and Plan / UC Course  I have reviewed the triage vital signs and the nursing notes.  Pertinent labs & imaging results that were available during my care of the patient were reviewed by me and considered in my medical decision making (see chart for details).     Patient with multiple recurrent visits to emergency room in urgent care first similar symptoms.  Vital signs stable today, no fever.  Exam unremarkable.  Discussed with patient that cholesterol is a chronic disease and needs to be checked and managed by primary care.  Provided patient with primary care contact to establish care for further evaluation of cholesterol.  Recommended diet and exercise for initial recommendations for elevated cholesterol.  Do not suspect sensation of feeling hot related to cholesterol at this time.  Will recommend patient treat headache/neck pain for reflux with Flexeril, refill of this as he has had improvement with this in the past as well as anti-inflammatories.  Patient does not like taking ibuprofen, will have patient try Naprosyn as alternative.Discussed strict return precautions. Patient verbalized understanding and is agreeable with plan.  Final Clinical Impressions(s) /  UC Diagnoses   Final diagnoses:  Sensation of feeling hot     Discharge Instructions     Please use Tylenol or Naprosyn twice daily for headache and neck pain I have refilled your flexeril You may use flexeril as needed to help with pain. This is a muscle relaxer and causes sedation- please use only at bedtime or when you will be home and not have to drive/work  Work on diet and exercise  Please establish care with [facility] at [facility] for further workup and management of your blood pressure and cholesterol   ED Prescriptions    Medication Sig Dispense Auth. Provider   cyclobenzaprine (FLEXERIL) 10 MG tablet Take 1 tablet (10 mg total) by mouth 2 (two) times daily as needed for muscle spasms. 20 tablet Wieters, Hallie C, PA-C   naproxen (NAPROSYN) 500 MG tablet Take 1 tablet (500 mg total) by mouth 2 (two) times daily. 20 tablet Wieters, [place] C, PA-C     Controlled Substance Prescriptions [place] Controlled Substance Registry consulted? Not Applicable   Lew Dawes, [place] 07/18/18 1832

## 2018-07-21 ENCOUNTER — Encounter (HOSPITAL_COMMUNITY): Payer: Self-pay | Admitting: *Deleted

## 2018-07-21 ENCOUNTER — Ambulatory Visit (HOSPITAL_COMMUNITY)
Admission: EM | Admit: 2018-07-21 | Discharge: 2018-07-21 | Disposition: A | Discharge status: Home / Self Care | Payer: Self-pay | Attending: Family Medicine | Admitting: Family Medicine

## 2018-07-21 ENCOUNTER — Other Ambulatory Visit: Payer: Self-pay

## 2018-07-21 DIAGNOSIS — R350 Frequency of micturition: Secondary | ICD-10-CM

## 2018-07-21 DIAGNOSIS — F411 Generalized anxiety disorder: Secondary | ICD-10-CM

## 2018-07-21 DIAGNOSIS — R3121 Asymptomatic microscopic hematuria: Secondary | ICD-10-CM

## 2018-07-21 DIAGNOSIS — R42 Dizziness and giddiness: Secondary | ICD-10-CM

## 2018-07-21 HISTORY — DX: Pure hypercholesterolemia, unspecified: E78.00

## 2018-07-21 HISTORY — DX: Dizziness and giddiness: R42

## 2018-07-21 LAB — POCT URINALYSIS DIP (DEVICE)
Bilirubin Urine: NEGATIVE
Glucose, UA: NEGATIVE mg/dL
Ketones, ur: NEGATIVE mg/dL
Leukocytes, UA: NEGATIVE
Nitrite: NEGATIVE
Protein, ur: NEGATIVE mg/dL
Specific Gravity, Urine: 1.01 (ref 1.005–1.030)
Urobilinogen, UA: 0.2 mg/dL (ref 0.0–1.0)
pH: 7 (ref 5.0–8.0)

## 2018-07-21 MED ORDER — LORAZEPAM 0.5 MG PO TABS: 0.5000 mg | ORAL_TABLET | Freq: Two times a day (BID) | ORAL | 1 refills | Status: DC

## 2018-07-21 NOTE — ED Triage Notes (Addendum)
C/O waking up at 0700 this AM with feeling of lightheadedness, "like I'm going to pass out".  Denies sensation of room spinning, but states sxs feel the same as the dizziness he takes meclizine for.  C/O pain in posterior neck.  Denies fevers.  Denies n/v.

## 2018-07-21 NOTE — Discharge Instructions (Addendum)
The warm feeling can be from a number of causes: - anxiety -fever -hormone imbalance -medical illness This needs to be evaluated by your PCP Keep that appointment in the 18th of this month I will refill only enough lorazepam to last until your appointment I noticed that most of your lorazepam prescriptions have been given to you through the urgent care center.  You need to be getting these from your primary care doctor and not the urgent care.  I will make a note in your chart not to refill these further.  If your primary care doctor will not give you lorazepam, request a referral for specialty treatment for your anxiety

## 2018-07-21 NOTE — ED Notes (Signed)
Discussed pt sxs & hx with PAs. OK to wait in line as usual.

## 2018-07-21 NOTE — ED Provider Notes (Signed)
[facility]    CSN: [id number] Arrival date & time: 07/21/18  1205     History   Chief Complaint Chief Complaint  Patient presents with  . Dizziness    HPI Wesley [name] is a 52 y.o. male.   HPI Patient is well-known to me for multiple  urgent care visits.  He is here today with a chief complaint of dizziness.  He states when he woke up and set up this morning he felt dizzy.  He states that this was a spinning sensation.  He states he did not take meclizine.  He did not have nausea.  No recent falls or injury.  No recent infection or sinus symptoms.  No visual symptoms, hearing symptoms, or neurologic concerns. No further he reports again that he is having a flushed or hot feeling in his face.  This was his reason for his visit on 07/18/2018.  He states that it happens intermittently.  He does not have any signs of infection such as cough or cold, nausea vomiting, no urinary symptoms, abdominal pain or diarrhea. He asked for a refill of his lorazepam.  I checked the [facility] for controlled substances.  I noted that he is gotten multiple refills of lorazepam from urgent care and ER providers.  I do not see any lorazepam from a regular provider.  He states he usually gets it from his "anxiety doctor".  I told him in the future he needs to keep regular appointment with his anxiety doctor and get lorazepam only from the anxiety doctor.  I told him that I would be putting a warning on his chart not to refill the lorazepam at the urgent care center.  This may be 1 of his reasons for frequent visits to the hospital Another complaint is urinary frequency.  He thinks is from drinking a lot of water.  No dysuria.  No hematuria.  I will check urinalysis today. Past Medical History:  Diagnosis Date  . Alcohol abuse   . Anxiety   . Anxiety   . Chronic neck pain   . Cocaine abuse (HCC)   . Dizziness   . Hypercholesteremia   . Hypertension   . Prediabetes     Patient  Active Problem List   Diagnosis Date Noted  . Legally blind in right eye, as defined in [place] 05/02/2017  . Prediabetes 04/23/2017  . Muscle spasm 04/23/2017  . Essential hypertension 04/16/2017  . H/O medication noncompliance 04/02/2017  . Vitamin D deficiency 04/02/2017  . DJD (degenerative joint disease) of cervical spine 03/16/2017  . 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 Dizziness 01/23/2017  . Anxiety 11/21/2016    Past Surgical History:  Procedure Laterality Date  . EYE SURGERY Right ~ 1980   "hit my eye"       Home Medications    Prior to Admission medications   Medication Sig Start Date End Date Taking? Authorizing Provider  amLODipine (NORVASC) 10 MG tablet Take 1 tablet (10 mg total) by mouth daily. 06/27/18 09/25/18 Yes Hoy Register, MD  cyclobenzaprine (FLEXERIL) 10 MG tablet Take 1 tablet (10 mg total) by mouth 2 (two) times daily as needed for muscle spasms. 07/18/18  Yes Wieters, Hallie C, PA-C  lisinopril (PRINIVIL,ZESTRIL) 5 MG tablet Take 1 tablet (5 mg total) by mouth daily. 04/19/18  Yes Claiborne Rigg, NP  meclizine (ANTIVERT) 25 MG tablet Take 1 tablet (25 mg total) by mouth 3 (three) times daily as needed for dizziness. 06/28/18  Yes Lauenstein,  [place] Ana, MD  Olopatadine HCl 0.2 % SOLN Apply 1 drop to eye daily. 07/17/18  Yes Hagler, Arlys John, MD  omeprazole (PRILOSEC) 20 MG capsule Take 1 capsule (20 mg total) by mouth daily. 05/28/18  Yes Petrucelli, Samantha R, PA-C  tobramycin (TOBREX) 0.3 % ophthalmic solution Place 1 drop into the right eye every 4 (four) hours. 07/08/18  Yes Elvina Sidle, MD  acetaminophen (TYLENOL) 500 MG tablet Take 1,000 mg by mouth daily as needed (back pain).    [provider]  LORazepam (ATIVAN) 0.5 MG tablet Take 1 tablet (0.5 mg total) by mouth 2 (two) times daily. 07/21/18   Eustace Moore, MD    Family History Family History  Problem Relation Age of Onset  . Hypertension Mother   . Hypertension Father     Social  History Social History   Tobacco Use  . Smoking status: Former Games developer  . Smokeless tobacco: Never Used  Substance Use Topics  . Alcohol use: No    Comment: former  . Drug use: Not Currently    Comment: denies, reports past history      Allergies   Ibuprofen; Other; and Zofran [ondansetron hcl]   Review of Systems Review of Systems  Constitutional: Negative for chills and fever.  HENT: Negative for congestion, ear pain and sore throat.   Eyes: Negative for pain and visual disturbance.  Respiratory: Negative for cough and shortness of breath.   Cardiovascular: Negative for chest pain and palpitations.  Gastrointestinal: Negative for abdominal pain and vomiting.  Genitourinary: Negative for dysuria and hematuria.  Musculoskeletal: Negative for arthralgias and back pain.  Skin: Negative for color change and rash.  Neurological: Positive for dizziness. Negative for seizures, syncope, weakness and light-headedness.  Psychiatric/Behavioral: The patient is nervous/anxious.   All other systems reviewed and are negative.    Physical Exam Triage Vital Signs ED Triage Vitals  Enc Vitals Group     BP 07/21/18 1222 135/86     Pulse Rate 07/21/18 1222 99     Resp 07/21/18 1222 18     Temp 07/21/18 1222 97.9 F (36.6 C)     Temp Source 07/21/18 1222 Oral     SpO2 07/21/18 1222 99 %     Weight --      Height --      Head Circumference --      Peak Flow --      Pain Score 07/21/18 1223 9     Pain Loc --      Pain Edu? --      Excl. in GC? --    No data found.  Updated Vital Signs BP 135/86   Pulse 99   Temp 97.9 F (36.6 C) (Oral)   Resp 18   SpO2 99%       Physical Exam  Constitutional: He appears well-developed and well-nourished. No distress.  HENT:  Head: Normocephalic and atraumatic.  Right Ear: External ear normal.  Left Ear: External ear normal.  Mouth/Throat: Oropharynx is clear and moist.  Eyes: Pupils are equal, round, and reactive to light.  Conjunctivae are normal.  No nystagmus  Neck: Normal range of motion. Neck supple.  Cardiovascular: Normal rate, regular rhythm and normal heart sounds.  Pulmonary/Chest: Effort normal and breath sounds normal. No respiratory distress.  Abdominal: Soft. He exhibits no distension.  No CVAT or abdominal tenderness.  No organomegaly  Musculoskeletal: Normal range of motion. He exhibits no edema.  Neurological: He is alert. He displays normal reflexes.  No cranial nerve deficit. Coordination normal.  Skin: Skin is warm and dry.  Psychiatric: He has a normal mood and affect. His behavior is normal.  Mildly anxious     UC Treatments / Results  Labs (all labs ordered are listed, but only abnormal results are displayed) Labs Reviewed  POCT URINALYSIS DIP (DEVICE) - Abnormal; Notable for the following components:      Result Value   Hgb urine dipstick TRACE (*)    All other components within normal limits    EKG None  Radiology No results found.  Procedures Procedures (including critical care time)  Medications Ordered in UC Medications - No data to display  Initial Impression / Assessment and Plan / UC Course  I have reviewed the triage vital signs and the nursing notes.  Pertinent labs & imaging results that were available during my care of the patient were reviewed by me and considered in my medical decision making (see chart for details).     He does have concentrated urine.  Cannot explain the trace of blood in his urine.  He needs to talk to his primary care provider about this.  He also needs to talk to his primary care provider about his hot flashes.  That is not a diagnosis to be evaluated in the urgent care center. I also was firm with him that he needs to be getting his lorazepam from one provider and not multiple refills from outside physicians.  Explained that this is not safe or good medical care. Final Clinical Impressions(s) / UC Diagnoses   Final diagnoses:   Urine frequency  Dizziness  Asymptomatic microscopic hematuria  Anxiety state     Discharge Instructions     The warm feeling can be from a number of causes: - anxiety -fever -hormone imbalance -medical illness This needs to be evaluated by your PCP Keep that appointment in the 18th of this month I will refill only enough lorazepam to last until your appointment I noticed that most of your lorazepam prescriptions have been given to you through the urgent care center.  You need to be getting these from your primary care doctor and not the urgent care.  I will make a note in your chart not to refill these further.  If your primary care doctor will not give you lorazepam, request a referral for specialty treatment for your anxiety    ED Prescriptions    Medication Sig Dispense Auth. Provider   LORazepam (ATIVAN) 0.5 MG tablet Take 1 tablet (0.5 mg total) by mouth 2 (two) times daily. 20 tablet Eustace Moore, MD     Controlled Substance Prescriptions  Controlled Substance Registry consulted?YES.  See notes above   Eustace Moore, MD 07/21/18 2038

## 2018-07-22 ENCOUNTER — Ambulatory Visit (HOSPITAL_COMMUNITY)
Admission: EM | Admit: 2018-07-22 | Discharge: 2018-07-22 | Disposition: A | Discharge status: Home / Self Care | Payer: Self-pay | Attending: Urgent Care | Admitting: Urgent Care

## 2018-07-22 ENCOUNTER — Encounter (HOSPITAL_COMMUNITY): Payer: Self-pay | Admitting: Emergency Medicine

## 2018-07-22 ENCOUNTER — Other Ambulatory Visit: Payer: Self-pay

## 2018-07-22 DIAGNOSIS — R5381 Other malaise: Secondary | ICD-10-CM

## 2018-07-22 DIAGNOSIS — R202 Paresthesia of skin: Secondary | ICD-10-CM

## 2018-07-22 DIAGNOSIS — M542 Cervicalgia: Secondary | ICD-10-CM

## 2018-07-22 DIAGNOSIS — M5030 Other cervical disc degeneration, unspecified cervical region: Secondary | ICD-10-CM

## 2018-07-22 DIAGNOSIS — R42 Dizziness and giddiness: Secondary | ICD-10-CM

## 2018-07-22 DIAGNOSIS — R200 Anesthesia of skin: Secondary | ICD-10-CM

## 2018-07-22 DIAGNOSIS — H9312 Tinnitus, left ear: Secondary | ICD-10-CM

## 2018-07-22 DIAGNOSIS — G44209 Tension-type headache, unspecified, not intractable: Secondary | ICD-10-CM

## 2018-07-22 MED ORDER — CETIRIZINE HCL 10 MG PO TABS: 10.0000 mg | ORAL_TABLET | Freq: Every day | ORAL | 1 refills | Status: DC

## 2018-07-22 MED ORDER — LORAZEPAM 0.5 MG PO TABS: 0.5000 mg | ORAL_TABLET | Freq: Two times a day (BID) | ORAL | 0 refills | Status: DC | PRN

## 2018-07-22 MED ORDER — FLUTICASONE PROPIONATE 50 MCG/ACT NA SUSP: 2.0000 | Freq: Every day | NASAL | 5 refills | Status: DC

## 2018-07-22 MED ORDER — PSEUDOEPHEDRINE HCL ER 120 MG PO TB12: 120.0000 mg | ORAL_TABLET | Freq: Two times a day (BID) | ORAL | 3 refills | Status: DC

## 2018-07-22 NOTE — Discharge Instructions (Addendum)
Ask your doctor about taking Prozac, Zoloft, Lexapro or Effexor for long term management of your anxiety.

## 2018-07-22 NOTE — ED Triage Notes (Signed)
The patient presented to the [facility] with a complaint of a headache and blurry vision. The patient was seen yesterday at the [facility] for the same complaint and stated that it is worse.

## 2018-07-22 NOTE — ED Provider Notes (Signed)
MRN: [id number] DOB: 14-May-1966  Subjective:   Wesley [name] is a 52 y.o. male presenting for ongoing dizziness, tension headache, neck pain, intermittent tingling of both hands between second and fourth fingers.  Patient has been seen here multiple times for similar complaints.  He has a PCP there who will be seen on 18 November.  Has requested multiple medication refills including lorazepam.  Yesterday, he was given a prescription refill of this by Dr. Delton See but unfortunately was not signed.  He would like to have this reprinted and signed today.  Has not tried medications for his current symptoms.  He does have a prescription for meloxicam, Flexeril and meclizine which she is tried in the past for symptoms without much change.  He reports that he has an anxiety doctor as well and will be seeing them until late November.  Denies alcohol use.  Denies confusion, disorientation, weakness and one-sided body, chest pain, belly pain, nausea, vomiting, heart racing, palpitations.  Denies history of diabetes, history of arrhythmia.  He does complain of left ear tinnitus as well.  Patient works in [employer] scheme manager, perform strenuous work and does not always use a mask.  No current facility-administered medications for this encounter.   Current Outpatient Medications:  .  acetaminophen (TYLENOL) 500 MG tablet, Take 1,000 mg by mouth daily as needed (back pain)., Disp: , Rfl:  .  amLODipine (NORVASC) 10 MG tablet, Take 1 tablet (10 mg total) by mouth daily., Disp: 30 tablet, Rfl: 0 .  cyclobenzaprine (FLEXERIL) 10 MG tablet, Take 1 tablet (10 mg total) by mouth 2 (two) times daily as needed for muscle spasms., Disp: 20 tablet, Rfl: 0 .  lisinopril (PRINIVIL,ZESTRIL) 5 MG tablet, Take 1 tablet (5 mg total) by mouth daily., Disp: 30 tablet, Rfl: 0 .  LORazepam (ATIVAN) 0.5 MG tablet, Take 1 tablet (0.5 mg total) by mouth 2 (two) times daily., Disp: 20 tablet, Rfl: 1 .  meclizine (ANTIVERT) 25 MG tablet, Take 1 tablet  (25 mg total) by mouth 3 (three) times daily as needed for dizziness., Disp: 30 tablet, Rfl: 5 .  Olopatadine HCl 0.2 % SOLN, Apply 1 drop to eye daily., Disp: 2.5 mL, Rfl: 0 .  omeprazole (PRILOSEC) 20 MG capsule, Take 1 capsule (20 mg total) by mouth daily., Disp: 30 capsule, Rfl: 0 .  tobramycin (TOBREX) 0.3 % ophthalmic solution, Place 1 drop into the right eye every 4 (four) hours., Disp: 5 mL, Rfl: 0    Allergies  Allergen Reactions  . Ibuprofen Other (See Comments) and Rash    Makes his throat get "very dry" after taking it States he cant take it  . Other Other (See Comments)    Steroids: Patient received a steroid shot from [facility] [phone] and it resulted in his muscles twitching all over his body  . Zofran [Ondansetron Hcl] Other (See Comments)    Causes sweating and body "feels badly"    Past Medical History:  Diagnosis Date  . Alcohol abuse   . Anxiety   . Anxiety   . Chronic neck pain   . Cocaine abuse (HCC)   . Dizziness   . Hypercholesteremia   . Hypertension   . Prediabetes      Past Surgical History:  Procedure Laterality Date  . EYE SURGERY Right ~ 1980   "hit my eye"    Objective:   Vitals: BP (!) 131/91 (BP Location: Left Arm)   Pulse 96   Temp 97.8 F (36.6  C) (Oral)   Resp 18   SpO2 98%   Physical Exam  Constitutional: He is oriented to person, place, and time. He appears well-developed and well-nourished.  HENT:  Right Ear: Tympanic membrane normal.  Left Ear: Tympanic membrane normal.  Mouth/Throat: Oropharynx is clear and moist.  Eyes: Pupils are equal, round, and reactive to light. EOM are normal. Right eye exhibits no discharge. Left eye exhibits no discharge. No scleral icterus.  Neck: Normal range of motion. Neck supple. No thyromegaly present.  Cardiovascular: Normal rate, regular rhythm, normal heart sounds and intact distal pulses. Exam reveals no gallop and no friction rub.  No murmur heard. Pulmonary/Chest:  Effort normal and breath sounds normal. No stridor. No respiratory distress. He has no wheezes. He has no rales.  Lymphadenopathy:    He has no cervical adenopathy.  Neurological: He is alert and oriented to person, place, and time.  Skin: Skin is warm and dry.   Assessment and Plan :   Dizziness  Tension headache  Malaise  Tinnitus of left ear  Degenerative disc disease, cervical  Neck pain  Numbness and tingling in both hands  I reviewed labs and imaging from patient's chart at length.  We discussed possible sources of his ongoing dizziness, tension headaches, tingling of his hands.  It is possible that his cervical degenerative disc disease is the source of his hand symptoms, neck pain and tension headaches.  I counseled that I would like for him to start meloxicam and continue aggressive hydration.  I recommended he follow-up with his PCP and see about getting a consult with a neurosurgeon/back specialist.  I also believe that he may be having difficulty from not using a mask at work.  He will do so going forward and start nasal steroid with antihistamine.  I recommended he try to obtain a referral to ENT from his PCP if these measures do not work.  ER and return to clinic precautions reviewed.  I did provide patient with the refill of lorazepam that he was not able to fill yesterday.  I explained to patient that Dr. Delton See simply forgot to sign the prescription and that she was not trying to sabotage as a refill.  I also emphasized the need to have further refills with his primary care doctor or his anxiety doctor.  I wrote out different options for medications that can manage his anxiety more long-term and reviewed these medication options with patient.  He is agreeable to continue his management with his psychiatrist and PCP.   Wallis Bamberg, [place] 07/22/18 1610

## 2018-07-29 ENCOUNTER — Inpatient Hospital Stay: Payer: Self-pay | Admitting: Nurse Practitioner

## 2018-08-02 ENCOUNTER — Encounter (HOSPITAL_COMMUNITY): Payer: Self-pay

## 2018-08-02 ENCOUNTER — Emergency Department (HOSPITAL_COMMUNITY)
Admission: EM | Admit: 2018-08-02 | Discharge: 2018-08-02 | Disposition: A | Discharge status: Home / Self Care | Payer: Self-pay | Attending: Emergency Medicine | Admitting: Emergency Medicine

## 2018-08-02 ENCOUNTER — Other Ambulatory Visit: Payer: Self-pay

## 2018-08-02 ENCOUNTER — Emergency Department (HOSPITAL_COMMUNITY): Payer: Self-pay

## 2018-08-02 DIAGNOSIS — Z87891 Personal history of nicotine dependence: Secondary | ICD-10-CM | POA: Insufficient documentation

## 2018-08-02 DIAGNOSIS — M7918 Myalgia, other site: Secondary | ICD-10-CM | POA: Insufficient documentation

## 2018-08-02 DIAGNOSIS — R1032 Left lower quadrant pain: Secondary | ICD-10-CM | POA: Insufficient documentation

## 2018-08-02 DIAGNOSIS — R109 Unspecified abdominal pain: Secondary | ICD-10-CM

## 2018-08-02 DIAGNOSIS — Z79899 Other long term (current) drug therapy: Secondary | ICD-10-CM | POA: Insufficient documentation

## 2018-08-02 DIAGNOSIS — I10 Essential (primary) hypertension: Secondary | ICD-10-CM | POA: Insufficient documentation

## 2018-08-02 LAB — URINALYSIS, ROUTINE W REFLEX MICROSCOPIC
Bilirubin Urine: NEGATIVE
Glucose, UA: NEGATIVE mg/dL
Hgb urine dipstick: NEGATIVE
Ketones, ur: NEGATIVE mg/dL
Leukocytes, UA: NEGATIVE
Nitrite: NEGATIVE
Protein, ur: NEGATIVE mg/dL
Specific Gravity, Urine: 1.004 — ABNORMAL LOW (ref 1.005–1.030)
pH: 8 (ref 5.0–8.0)

## 2018-08-02 LAB — CBC WITH DIFFERENTIAL/PLATELET
Abs Immature Granulocytes: 0.03 10*3/uL (ref 0.00–0.07)
Basophils Absolute: 0 10*3/uL (ref 0.0–0.1)
Basophils Relative: 0 %
Eosinophils Absolute: 0.1 10*3/uL (ref 0.0–0.5)
Eosinophils Relative: 2 %
HCT: 46.7 % (ref 39.0–52.0)
Hemoglobin: 13.7 g/dL (ref 13.0–17.0)
Immature Granulocytes: 1 %
Lymphocytes Relative: 33 %
Lymphs Abs: 1.8 10*3/uL (ref 0.7–4.0)
MCH: 27.2 pg (ref 26.0–34.0)
MCHC: 29.3 g/dL — ABNORMAL LOW (ref 30.0–36.0)
MCV: 92.7 fL (ref 80.0–100.0)
Monocytes Absolute: 0.7 10*3/uL (ref 0.1–1.0)
Monocytes Relative: 13 %
Neutro Abs: 2.7 10*3/uL (ref 1.7–7.7)
Neutrophils Relative %: 51 %
Platelets: 148 10*3/uL — ABNORMAL LOW (ref 150–400)
RBC: 5.04 MIL/uL (ref 4.22–5.81)
RDW: 14.4 % (ref 11.5–15.5)
WBC: 5.4 10*3/uL (ref 4.0–10.5)
nRBC: 0 % (ref 0.0–0.2)

## 2018-08-02 LAB — COMPREHENSIVE METABOLIC PANEL
ALT: 47 U/L — ABNORMAL HIGH (ref 0–44)
AST: 48 U/L — ABNORMAL HIGH (ref 15–41)
Albumin: 3.9 g/dL (ref 3.5–5.0)
Alkaline Phosphatase: 83 U/L (ref 38–126)
Anion gap: 10 (ref 5–15)
BUN: 9 mg/dL (ref 6–20)
CO2: 21 mmol/L — ABNORMAL LOW (ref 22–32)
Calcium: 8.3 mg/dL — ABNORMAL LOW (ref 8.9–10.3)
Chloride: 103 mmol/L (ref 98–111)
Creatinine, Ser: 0.82 mg/dL (ref 0.61–1.24)
GFR calc Af Amer: >60 mL/min (ref >60)
GFR calc non Af Amer: >60 mL/min (ref >60)
Glucose, Bld: 122 mg/dL — ABNORMAL HIGH (ref 70–99)
Potassium: 4.7 mmol/L (ref 3.5–5.1)
Sodium: 134 mmol/L — ABNORMAL LOW (ref 135–145)
Total Bilirubin: 0.9 mg/dL (ref 0.3–1.2)
Total Protein: 7.6 g/dL (ref 6.5–8.1)

## 2018-08-02 MED ORDER — CYCLOBENZAPRINE HCL 10 MG PO TABS: 10.0000 mg | ORAL_TABLET | Freq: Two times a day (BID) | ORAL | 0 refills | Status: DC | PRN

## 2018-08-02 NOTE — ED Triage Notes (Signed)
Patient c/o right flank pain that started today. Patient states pain is worse when he coughs or sits.

## 2018-08-02 NOTE — Discharge Instructions (Addendum)
Your exam was overall reassuring.  We suspect your pain in your right torso is due to muscular skeletal pain likely exacerbated by her coughing fits.  We did not see evidence of pneumonia and your lab work was reassuring.  He did not have evidence of blood in her urine which might suggest a kidney stone.  We feel you are safe for discharge home given her well appearance and demonstrate stability.    Please take the medicine to help with the muscle spasms and follow-up with your primary doctor.  If any symptoms change or worsen, please return to the nearest emergency department.

## 2018-08-02 NOTE — ED Provider Notes (Signed)
[facility]-EMER[facility]PT Provider Note   CSN: [id number] Arrival date & time: 08/02/18  1835     History   Chief Complaint Chief Complaint  Patient presents with  . Flank Pain    HPI Wesley [name] is a 52 y.o. male.  The history is provided by the patient, medical records and a relative. No language interpreter was used.  Flank Pain  This is a new problem. The current episode started 6 to 12 hours ago. The problem occurs constantly. The problem has not changed since onset.Pertinent negatives include no chest pain, no abdominal pain, no headaches and no shortness of breath. Associated symptoms comments: Right flank pain . The symptoms are aggravated by twisting and coughing. Nothing relieves the symptoms. He has tried nothing for the symptoms. The treatment provided no relief.    Past Medical History:  Diagnosis Date  . Alcohol abuse   . Anxiety   . Anxiety   . Chronic neck pain   . Cocaine abuse (HCC)   . Dizziness   . Hypercholesteremia   . Hypertension   . Prediabetes     Patient Active Problem List   Diagnosis Date Noted  . Legally blind in right eye, as defined in [place] 05/02/2017  . Prediabetes 04/23/2017  . Muscle spasm 04/23/2017  . Essential hypertension 04/16/2017  . H/O medication noncompliance 04/02/2017  . Vitamin D deficiency 04/02/2017  . DJD (degenerative joint disease) of cervical spine 03/16/2017  . 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 Dizziness 01/23/2017  . Anxiety 11/21/2016    Past Surgical History:  Procedure Laterality Date  . EYE SURGERY Right ~ 1980   "hit my eye"        Home Medications    Prior to Admission medications   Medication Sig Start Date End Date Taking? Authorizing Provider  acetaminophen (TYLENOL) 500 MG tablet Take 1,000 mg by mouth daily as needed (back pain).    [provider]  amLODipine (NORVASC) 10 MG tablet Take 1 tablet (10 mg total) by mouth daily. 06/27/18 09/25/18  Hoy Register, MD    cetirizine (ZYRTEC ALLERGY) 10 MG tablet Take 1 tablet (10 mg total) by mouth daily. 07/22/18   Wallis Bamberg, PA-C  cyclobenzaprine (FLEXERIL) 10 MG tablet Take 1 tablet (10 mg total) by mouth 2 (two) times daily as needed for muscle spasms. 07/18/18   Wieters, Hallie C, PA-C  fluticasone (FLONASE) 50 MCG/ACT nasal spray Place 2 sprays into both nostrils daily. 07/22/18   Wallis Bamberg, PA-C  lisinopril (PRINIVIL,ZESTRIL) 5 MG tablet Take 1 tablet (5 mg total) by mouth daily. 04/19/18   Claiborne Rigg, NP  LORazepam (ATIVAN) 0.5 MG tablet Take 1 tablet (0.5 mg total) by mouth 2 (two) times daily as needed for anxiety. 07/22/18   Wallis Bamberg, PA-C  meclizine (ANTIVERT) 25 MG tablet Take 1 tablet (25 mg total) by mouth 3 (three) times daily as needed for dizziness. 06/28/18   Elvina Sidle, MD  Olopatadine HCl 0.2 % SOLN Apply 1 drop to eye daily. 07/17/18   Mardella Layman, MD  omeprazole (PRILOSEC) 20 MG capsule Take 1 capsule (20 mg total) by mouth daily. 05/28/18   Petrucelli, Pleas Koch, PA-C  pseudoephedrine (SUDAFED 12 HOUR) 120 MG 12 hr tablet Take 1 tablet (120 mg total) by mouth 2 (two) times daily. 07/22/18   Wallis Bamberg, PA-C  tobramycin (TOBREX) 0.3 % ophthalmic solution Place 1 drop into the right eye every 4 (four) hours. 07/08/18   Elvina Sidle, MD  Family History Family History  Problem Relation Age of Onset  . Hypertension Mother   . Hypertension Father     Social History Social History   Tobacco Use  . Smoking status: Former Games developermoker  . Smokeless tobacco: Never Used  Substance Use Topics  . Alcohol use: Not Currently    Comment: former  . Drug use: Not Currently    Comment: denies, reports past history      Allergies   Ibuprofen; Other; and Zofran [ondansetron hcl]   Review of Systems Review of Systems  Constitutional: Negative for chills, fatigue and fever.  HENT: Negative for congestion and rhinorrhea.   Respiratory: Negative for cough, chest tightness,  shortness of breath and wheezing.   Cardiovascular: Negative for chest pain, palpitations and leg swelling.  Gastrointestinal: Negative for abdominal pain, constipation, diarrhea, nausea and vomiting.  Genitourinary: Positive for flank pain. Negative for frequency.  Musculoskeletal: Positive for back pain. Negative for neck pain and neck stiffness.  Skin: Negative for rash and wound.  Neurological: Negative for light-headedness and headaches.  Psychiatric/Behavioral: Negative for agitation.  All other systems reviewed and are negative.    Physical Exam Updated Vital Signs BP (!) 141/95 (BP Location: Right Arm)   Pulse 97   Temp 98.3 F (36.8 C) (Oral)   Resp 18   Ht 5\' 6"  (1.676 m)   Wt 90.7 kg   SpO2 99%   BMI 32.28 kg/m   Physical Exam  Constitutional: He appears well-developed and well-nourished. No distress.  HENT:  Head: Normocephalic and atraumatic.  Mouth/Throat: Oropharynx is clear and moist. No oropharyngeal exudate.  Eyes: Pupils are equal, round, and reactive to light. Conjunctivae and EOM are normal.  Neck: Neck supple.  Cardiovascular: Normal rate and regular rhythm.  No murmur heard. Pulmonary/Chest: Effort normal and breath sounds normal. No stridor. No tachypnea. No respiratory distress. He has no wheezes. He has no rhonchi. He has no rales.     He exhibits no tenderness.  Abdominal: Soft. Normal appearance. There is no tenderness.    Musculoskeletal: He exhibits tenderness. He exhibits no edema or deformity.  Neurological: He is alert. No sensory deficit. He exhibits normal muscle tone.  Skin: Skin is warm and dry. He is not diaphoretic. No erythema. No pallor.  Psychiatric: He has a normal mood and affect.  Nursing note and vitals reviewed.    ED Treatments / Results  Labs (all labs ordered are listed, but only abnormal results are displayed) Labs Reviewed  URINALYSIS, ROUTINE W REFLEX MICROSCOPIC - Abnormal; Notable for the following  components:      Result Value   Color, Urine COLORLESS (*)    Specific Gravity, Urine 1.004 (*)    All other components within normal limits  URINE CULTURE  CBC WITH DIFFERENTIAL/PLATELET  COMPREHENSIVE METABOLIC PANEL    EKG None  Radiology Dg Chest 2 View  Result Date: 08/02/2018 CLINICAL DATA:  Right flank and right lateral chest pain. EXAM: CHEST - 2 VIEW COMPARISON:  09/30/2017 FINDINGS: The heart size and mediastinal contours are within normal limits. No alveolar consolidation, effusion or pneumothorax. Mild chronic bronchitic change of the lungs. The visualized skeletal structures are unremarkable. IMPRESSION: Mild bronchitic change of the lungs. Electronically Signed   By: Tollie Ethavid  Kwon M.D.   On: 08/02/2018 20:31    Procedures Procedures (including critical care time)  Medications Ordered in ED Medications - No data to display   Initial Impression / Assessment and Plan / ED Course  I have  reviewed the triage vital signs and the nursing notes.  Pertinent labs & imaging results that were available during my care of the patient were reviewed by me and considered in my medical decision making (see chart for details).     Wesley [name] is a 52 y.o. male with a past medical history significant for anxiety, hypertension, polysubstance abuse, cholesterolemia, and prior muscle spasms who presents with right torso pain.  Patient reports that at 9 AM this morning he had a coughing fit and started having pain in his right flank and right lateral chest area.  He reports this is a new pain.  He says that it is been constant but worsens when he coughs, twists, or moves.  He reports no hematuria or urinary symptoms.  No nausea or vomiting, fevers, chills, chest pain, or shortness of breath.  He denies any recent trauma.  He denies other complaints on arrival.  No leg pain, leg swelling, or edema.  Next  On exam, patient is tenderness in his right flank and right lateral chest.  Breath  sounds are equal and clear.  No significant murmur.  Abdomen nontender.  Mild right flank tenderness.  No rash seen to indicate shingles.  Exam otherwise unremarkable.  Clinically I suspect patient is having muscular skeletal pain from muscle spasm.  However, given the coughing fits and lateral chest discomfort, will obtain chest x-ray to look for pneumothorax or subtle pneumonia.  We will have urinalysis to look for significant hematuria given the right flank pain to rule out or look for stone.  Will check screening laboratory testing.  Next  If work-up is reassuring, anticipate discharge with recurrent muscle spasms and muscle pain.  Work-up was overall reassuring aside from slightly elevated LFT.  No blood in urine to suggest kidney stone.  Patient reports his pain is improved.  Continue to suspect musculoskeletal pain.  Patient given prescription for muscle relaxant and will follow-up with PCP.  Patient understood return precautions.  Patient discharged in good condition.  Final Clinical Impressions(s) / ED Diagnoses   Final diagnoses:  Left flank pain  Musculoskeletal pain    ED Discharge Orders         Ordered    cyclobenzaprine (FLEXERIL) 10 MG tablet  2 times daily PRN     08/02/18 2159         Clinical Impression: 1. Left flank pain   2. Musculoskeletal pain     Disposition: Discharge  Condition: Good  I have discussed the results, Dx and Tx plan with the pt(& family if present). He/she/they expressed understanding and agree(s) with the plan. Discharge instructions discussed at great length. Strict return precautions discussed and pt &/or family have verbalized understanding of the instructions. No further questions at time of discharge.    Discharge Medication List as of 08/02/2018 10:00 PM    START taking these medications   Details  !! cyclobenzaprine (FLEXERIL) 10 MG tablet Take 1 tablet (10 mg total) by mouth 2 (two) times daily as needed for muscle spasms.,  Starting Fri 08/02/2018, Print     !! - Potential duplicate medications found. Please discuss with provider.      Follow Up: Claiborne Rigg, NP [address] [place] [address] [phone]     [facility] [address] [id number] mc [address] [place] [address] [phone]       Tegeler, Canary Brim, MD 08/03/18 [phone]

## 2018-08-03 ENCOUNTER — Encounter (HOSPITAL_COMMUNITY): Payer: Self-pay | Admitting: Emergency Medicine

## 2018-08-03 ENCOUNTER — Ambulatory Visit (HOSPITAL_COMMUNITY)
Admission: EM | Admit: 2018-08-03 | Discharge: 2018-08-03 | Disposition: A | Discharge status: Home / Self Care | Payer: Self-pay

## 2018-08-03 NOTE — ED Triage Notes (Signed)
Pt here for neck pain x 3 hours; pt denies injury

## 2018-08-04 LAB — URINE CULTURE: Culture: <10000 — AB

## 2018-08-06 NOTE — ED Provider Notes (Signed)
[facility]    CSN: [id number] Arrival date & time: 08/03/18  1622     History   Chief Complaint Chief Complaint  Patient presents with  . Neck Pain    HPI Wesley [name] is a 52 y.o. male.   HPI  Patient left without being seen  Past Medical History:  Diagnosis Date  . Alcohol abuse   . Anxiety   . Anxiety   . Chronic neck pain   . Cocaine abuse (HCC)   . Dizziness   . Hypercholesteremia   . Hypertension   . Prediabetes     Patient Active Problem List   Diagnosis Date Noted  . 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 Prediabetes 04/23/2017  . Muscle spasm 04/23/2017  . Essential hypertension 04/16/2017  . H/O medication noncompliance 04/02/2017  . Vitamin D deficiency 04/02/2017  . DJD (degenerative joint disease) of cervical spine 03/16/2017  . 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 Dizziness 01/23/2017  . Anxiety 11/21/2016    Past Surgical History:  Procedure Laterality Date  . EYE SURGERY Right ~ 1980   "hit my eye"       Home Medications    Prior to Admission medications   Medication Sig Start Date End Date Taking? Authorizing Provider  acetaminophen (TYLENOL) 500 MG tablet Take 1,000 mg by mouth daily as needed (back pain).    [provider]  amLODipine (NORVASC) 10 MG tablet Take 1 tablet (10 mg total) by mouth daily. 06/27/18 09/25/18  Hoy RegisterNewlin, Enobong, MD  cetirizine (ZYRTEC ALLERGY) 10 MG tablet Take 1 tablet (10 mg total) by mouth daily. Patient not taking: Reported on 08/02/2018 07/22/18   Wallis BambergMani, Mario, PA-C  cyclobenzaprine (FLEXERIL) 10 MG tablet Take 1 tablet (10 mg total) by mouth 2 (two) times daily as needed for muscle spasms. Patient not taking: Reported on 08/02/2018 07/18/18   Wieters, Hallie C, PA-C  cyclobenzaprine (FLEXERIL) 10 MG tablet Take 1 tablet (10 mg total) by mouth 2 (two) times daily as needed for muscle spasms. 08/02/18   Tegeler, Canary Brimhristopher J, MD  fluticasone (FLONASE) 50 MCG/ACT nasal  spray Place 2 sprays into both nostrils daily. Patient not taking: Reported on 08/02/2018 07/22/18   Wallis BambergMani, Mario, PA-C  hydroxypropyl methylcellulose / hypromellose (ISOPTO TEARS / GONIOVISC) 2.5 % ophthalmic solution Place 1 drop into both eyes 3 (three) times daily as needed for dry eyes.    [provider]  lisinopril (PRINIVIL,ZESTRIL) 5 MG tablet Take 1 tablet (5 mg total) by mouth daily. 04/19/18   Claiborne RiggFleming, Zelda W, NP  LORazepam (ATIVAN) 0.5 MG tablet Take 1 tablet (0.5 mg total) by mouth 2 (two) times daily as needed for anxiety. Patient not taking: Reported on 08/02/2018 07/22/18   Wallis BambergMani, Mario, PA-C  meclizine (ANTIVERT) 25 MG tablet Take 1 tablet (25 mg total) by mouth 3 (three) times daily as needed for dizziness. 06/28/18   Elvina SidleLauenstein, Kurt, MD  Olopatadine HCl 0.2 % SOLN Apply 1 drop to eye daily. Patient not taking: Reported on 08/02/2018 07/17/18   Mardella LaymanHagler, Brian, MD  omeprazole (PRILOSEC) 20 MG capsule Take 1 capsule (20 mg total) by mouth daily. Patient not taking: Reported on 08/02/2018 05/28/18   Petrucelli, Pleas KochSamantha R, PA-C  pseudoephedrine (SUDAFED 12 HOUR) 120 MG 12 hr tablet Take 1 tablet (120 mg total) by mouth 2 (two) times daily. Patient not taking: Reported on 08/02/2018 07/22/18   Wallis BambergMani, Mario, PA-C  tobramycin (TOBREX) 0.3 % ophthalmic solution Place 1 drop  into the right eye every 4 (four) hours. Patient not taking: Reported on 08/02/2018 07/08/18   Elvina Sidle, MD    Family History Family History  Problem Relation Age of Onset  . Hypertension Mother   . Hypertension Father     Social History Social History   Tobacco Use  . Smoking status: Former Games developer  . Smokeless tobacco: Never Used  Substance Use Topics  . Alcohol use: Not Currently    Comment: former  . Drug use: Not Currently    Comment: denies, reports past history      Allergies   Ibuprofen; Other; and Zofran [ondansetron hcl]   Review of Systems Review of Systems   Physical  Exam Triage Vital Signs ED Triage Vitals  Enc Vitals Group     BP 08/03/18 1654 118/76     Pulse Rate 08/03/18 1654 99     Resp 08/03/18 1654 18     Temp 08/03/18 1654 97.9 F (36.6 C)     Temp Source 08/03/18 1654 Oral     SpO2 08/03/18 1654 95 %     Weight --      Height --      Head Circumference --      Peak Flow --      Pain Score 08/03/18 1655 5     Pain Loc --      Pain Edu? --      Excl. in GC? --    No data found.  Updated Vital Signs BP 118/76 (BP Location: Left Arm)   Pulse 99   Temp 97.9 F (36.6 C) (Oral)   Resp 18   SpO2 95%   Visual Acuity Right Eye Distance:   Left Eye Distance:   Bilateral Distance:    Right Eye Near:   Left Eye Near:    Bilateral Near:     Physical Exam   UC Treatments / Results  Labs (all labs ordered are listed, but only abnormal results are displayed) Labs Reviewed - No data to display  EKG None  Radiology No results found.  Procedures Procedures (including critical care time)  Medications Ordered in UC Medications - No data to display  Initial Impression / Assessment and Plan / UC Course  I have reviewed the triage vital signs and the nursing notes.  Pertinent labs & imaging results that were available during my care of the patient were reviewed by me and considered in my medical decision making (see chart for details).     Final Clinical Impressions(s) / UC Diagnoses   Final diagnoses:  None   Discharge Instructions   None    ED Prescriptions    None     Controlled Substance Prescriptions [place] Controlled Substance Registry consulted? Not Applicable   Bing Neighbors, FNP 08/06/18 2230

## 2018-08-13 NOTE — ED Provider Notes (Signed)
[facility]   [id number] 07/17/18 Arrival Time: 1902  ASSESSMENT & PLAN:  1. Uncontrolled hypertension   2. Blurry vision, bilateral   3. Acute conjunctivitis of right eye, unspecified acute conjunctivitis type    Meds ordered this encounter  Medications  . Olopatadine HCl 0.2 % SOLN    Sig: Apply 1 drop to eye daily.    Dispense:  2.5 mL    Refill:  0   Reassured Matthe again that these symptoms do not appear to represent a serious or threatening condition. As usual, he agrees to proceed to the ED if he develops other symptoms such as alterations of speech, swallowing, vision, motor/sensory systems, or if dizziness worsens.  Reviewed expectations re: course of current medical issues. Questions answered. Outlined signs and symptoms indicating need for more acute intervention. Patient verbalized understanding. After Visit Summary given.   SUBJECTIVE:  Johnnette LitterJose Shuttleworth is a 52 y.o. male who reports that "I felt a little dizzy and my head was feeling hot" about 3 hours ago. Symptoms have lessened. No difference from the same symptoms he experiences every once in awhile. .dizziness described as lightheadedness. Frequency: once lasting about 1-2 hours. Symptoms are exacerbated by bending. Positions that worsen symptoms: none. Previous workup/treatments: multiple head scans that have been negative. Associated ear symptoms: none. Associated CNS symptoms: none. Patient denies otalgia and tinnitus. Recent infections: none. Head trauma: denied. Drug ingestion: none. Ambulatory without difficulty. No new headaches. Does report both of his eyes have been irritated lately. A little itchy. Occasional redness. Does not wear contacts. No vision loss or vision changes reported. No OTC treatment.  Skyelar almost always requests a finger stick blood sugar. He worries about the possibility of diabetes.  ROS: As per HPI.   OBJECTIVE:  Vitals:   07/17/18 2010  BP: (!) 129/91  Pulse: 82  Resp:  18  Temp: 98 F (36.7 C)  TempSrc: Oral  SpO2: 99%    General appearance: alert; no distress Eyes: PERRLA; EOMI; conjunctivae are slightly injected; no eye drainage HENT: normocephalic; atraumatic; TMs normal; nasal mucosa normal; oral mucosa normal Neck: supple with FROM Lungs: clear to auscultation bilaterally Heart: regular rate and rhythm Abdomen: soft, non-tender; bowel sounds normal Extremities: no cyanosis or edema; symmetrical with no gross deformities Skin: warm and dry Neurologic: normal gait; DTR's normal and symmetric.; CN 2-12 grossly intact; rapid changes in position during the exam do not precipitate brief dizziness he describes Psychological: alert and cooperative; normal mood and affect  Investigations: Results for orders placed or performed during the hospital encounter of 07/17/18  Glucose, capillary  Result Value Ref Range   Glucose-Capillary 102 (H) 70 - 99 mg/dL   Labs Reviewed  GLUCOSE, CAPILLARY - Abnormal; Notable for the following components:      Result Value   Glucose-Capillary 102 (*)    All other components within normal limits    Allergies  Allergen Reactions  . Ibuprofen Other (See Comments) and Rash    Makes his throat get "very dry" after taking it States he cant take it  . Other Other (See Comments)    Steroids: Patient received a steroid shot from [facility] [phone] and it resulted in his muscles twitching all over his body  . Zofran [Ondansetron Hcl] Other (See Comments)    Causes sweating and body "feels badly"    Past Medical History:  Diagnosis Date  . Alcohol abuse   . Anxiety   . Anxiety   .  Chronic neck pain   . Cocaine abuse (HCC)   . Dizziness   . Hypercholesteremia   . Hypertension   . Prediabetes    Social History   Socioeconomic History  . Marital status: Significant Other    Spouse name: Not on file  . Number of children: Not on file  . Years of education: Not on file  . Highest education  level: Not on file  Occupational History  . Not on file  Social Needs  . Financial resource strain: Not on file  . Food insecurity:    Worry: Not on file    Inability: Not on file  . Transportation needs:    Medical: Not on file    Non-medical: Not on file  Tobacco Use  . Smoking status: Former Games developer  . Smokeless tobacco: Never Used  Substance and Sexual Activity  . Alcohol use: Not Currently    Comment: former  . Drug use: Not Currently    Comment: denies, reports past history   . Sexual activity: Not on file  Lifestyle  . Physical activity:    Days per week: Not on file    Minutes per session: Not on file  . Stress: Not on file  Relationships  . Social connections:    Talks on phone: Not on file    Gets together: Not on file    Attends religious service: Not on file    Active member of club or organization: Not on file    Attends meetings of clubs or organizations: Not on file    Relationship status: Not on file  . Intimate partner violence:    Fear of current or ex partner: Not on file    Emotionally abused: Not on file    Physically abused: Not on file    Forced sexual activity: Not on file  Other Topics Concern  . Not on file  Social History Narrative  . Not on file   Family History  Problem Relation Age of Onset  . Hypertension Mother   . Hypertension Father    Past Surgical History:  Procedure Laterality Date  . EYE SURGERY Right ~ 1980   "hit my eye"      Mardella Layman, MD 08/13/18 [phone]

## 2018-08-18 ENCOUNTER — Encounter (HOSPITAL_COMMUNITY): Payer: Self-pay | Admitting: Emergency Medicine

## 2018-08-18 ENCOUNTER — Ambulatory Visit (HOSPITAL_COMMUNITY)
Admission: EM | Admit: 2018-08-18 | Discharge: 2018-08-18 | Disposition: A | Discharge status: Home / Self Care | Payer: Self-pay | Attending: Emergency Medicine | Admitting: Emergency Medicine

## 2018-08-18 DIAGNOSIS — Z760 Encounter for issue of repeat prescription: Secondary | ICD-10-CM

## 2018-08-18 DIAGNOSIS — I10 Essential (primary) hypertension: Secondary | ICD-10-CM

## 2018-08-18 DIAGNOSIS — S161XXA Strain of muscle, fascia and tendon at neck level, initial encounter: Secondary | ICD-10-CM

## 2018-08-18 MED ORDER — MELOXICAM 7.5 MG PO TABS: 7.5000 mg | ORAL_TABLET | Freq: Every day | ORAL | 0 refills | Status: DC

## 2018-08-18 MED ORDER — LISINOPRIL 5 MG PO TABS: 5.0000 mg | ORAL_TABLET | Freq: Every day | ORAL | 0 refills | Status: DC

## 2018-08-18 MED ORDER — CYCLOBENZAPRINE HCL 5 MG PO TABS: 5.0000 mg | ORAL_TABLET | Freq: Two times a day (BID) | ORAL | 0 refills | Status: DC | PRN

## 2018-08-18 NOTE — Discharge Instructions (Signed)
°  Flexeril (cyclobenzaprine) is a muscle relaxer and may cause drowsiness. Do not drink alcohol, drive, or operate heavy machinery while taking.  Meloxicam (Mobic) is an antiinflammatory to help with pain and inflammation.  Do not take ibuprofen, Advil, Aleve, or any other medications that contain NSAIDs while taking meloxicam as this may cause stomach upset or even ulcers if taken in large amounts for an extended period of time.   Please follow up with your family doctor as previously scheduled for 08/28/18 for recheck of symptoms and ongoing healthcare needs.

## 2018-08-18 NOTE — ED Triage Notes (Signed)
Pt c/o neck pain since yesterday

## 2018-08-18 NOTE — ED Provider Notes (Signed)
[facility]    CSN: [id number] Arrival date & time: 08/18/18  1128     History   Chief Complaint Chief Complaint  Patient presents with  . Neck Pain    HPI Wesley [name] is a 52 y.o. male.   HPI Wesley [name] is a 52 y.o. male presenting to UC with c/o that is aching and sore since yesterday. He has not taken anything for the pain that is 8/10 in severity. No known injury. Denies radiation of pain or numbness.    He is also requesting refill of his BP medication until her can f/u with his PCP on 08/28/18.     Past Medical History:  Diagnosis Date  . Alcohol abuse   . Anxiety   . Anxiety   . Chronic neck pain   . Cocaine abuse (HCC)   . Dizziness   . Hypercholesteremia   . Hypertension   . Prediabetes     Patient Active Problem List   Diagnosis Date Noted  . Legally blind in right eye, as defined in [place] 05/02/2017  . Prediabetes 04/23/2017  . Muscle spasm 04/23/2017  . Essential hypertension 04/16/2017  . H/O medication noncompliance 04/02/2017  . Vitamin D deficiency 04/02/2017  . DJD (degenerative joint disease) of cervical spine 03/16/2017  . 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 Dizziness 01/23/2017  . Anxiety 11/21/2016    Past Surgical History:  Procedure Laterality Date  . EYE SURGERY Right ~ 1980   "hit my eye"       Home Medications    Prior to Admission medications   Medication Sig Start Date End Date Taking? Authorizing Provider  acetaminophen (TYLENOL) 500 MG tablet Take 1,000 mg by mouth daily as needed (back pain).    [provider]  amLODipine (NORVASC) 10 MG tablet Take 1 tablet (10 mg total) by mouth daily. 06/27/18 09/25/18  Hoy Register, MD  cetirizine (ZYRTEC ALLERGY) 10 MG tablet Take 1 tablet (10 mg total) by mouth daily. Patient not taking: Reported on 08/02/2018 07/22/18   Wallis Bamberg, PA-C  cyclobenzaprine (FLEXERIL) 10 MG tablet Take 1 tablet (10 mg total) by mouth 2 (two) times daily as needed for muscle  spasms. Patient not taking: Reported on 08/02/2018 07/18/18   Wieters, Hallie C, PA-C  cyclobenzaprine (FLEXERIL) 10 MG tablet Take 1 tablet (10 mg total) by mouth 2 (two) times daily as needed for muscle spasms. 08/02/18   Tegeler, Canary Brim, MD  cyclobenzaprine (FLEXERIL) 5 MG tablet Take 1-2 tablets (5-10 mg total) by mouth 2 (two) times daily as needed for muscle spasms. 08/18/18   Lurene Shadow, PA-C  fluticasone (FLONASE) 50 MCG/ACT nasal spray Place 2 sprays into both nostrils daily. Patient not taking: Reported on 08/02/2018 07/22/18   Wallis Bamberg, PA-C  hydroxypropyl methylcellulose / hypromellose (ISOPTO TEARS / GONIOVISC) 2.5 % ophthalmic solution Place 1 drop into both eyes 3 (three) times daily as needed for dry eyes.    [provider]  lisinopril (PRINIVIL,ZESTRIL) 5 MG tablet Take 1 tablet (5 mg total) by mouth daily. 08/18/18   Lurene Shadow, PA-C  LORazepam (ATIVAN) 0.5 MG tablet Take 1 tablet (0.5 mg total) by mouth 2 (two) times daily as needed for anxiety. Patient not taking: Reported on 08/02/2018 07/22/18   Wallis Bamberg, PA-C  meclizine (ANTIVERT) 25 MG tablet Take 1 tablet (25 mg total) by mouth 3 (three) times daily as needed for dizziness. 06/28/18   Elvina Sidle, MD  meloxicam (MOBIC) 7.5 MG tablet Take  1-2 tablets (7.5-15 mg total) by mouth daily. 08/18/18   Lurene ShadowPhelps,  O, PA-C  Olopatadine HCl 0.2 % SOLN Apply 1 drop to eye daily. Patient not taking: Reported on 08/02/2018 07/17/18   Mardella LaymanHagler, Brian, MD  omeprazole (PRILOSEC) 20 MG capsule Take 1 capsule (20 mg total) by mouth daily. Patient not taking: Reported on 08/02/2018 05/28/18   Petrucelli, Pleas KochSamantha R, PA-C  pseudoephedrine (SUDAFED 12 HOUR) 120 MG 12 hr tablet Take 1 tablet (120 mg total) by mouth 2 (two) times daily. Patient not taking: Reported on 08/02/2018 07/22/18   Wallis BambergMani, Mario, PA-C  tobramycin (TOBREX) 0.3 % ophthalmic solution Place 1 drop into the right eye every 4 (four) hours. Patient not  taking: Reported on 08/02/2018 07/08/18   Elvina SidleLauenstein, Kurt, MD    Family History Family History  Problem Relation Age of Onset  . Hypertension Mother   . Hypertension Father     Social History Social History   Tobacco Use  . Smoking status: Former Games developermoker  . Smokeless tobacco: Never Used  Substance Use Topics  . Alcohol use: Not Currently    Comment: former  . Drug use: Not Currently    Comment: denies, reports past history      Allergies   Ibuprofen; Other; and Zofran [ondansetron hcl]   Review of Systems Review of Systems  Musculoskeletal: Positive for back pain, myalgias and neck pain. Negative for arthralgias and neck stiffness.  Skin: Negative for color change and wound.  Neurological: Negative for weakness and numbness.     Physical Exam Triage Vital Signs ED Triage Vitals [08/18/18 1224]  Enc Vitals Group     BP (!) 128/91     Pulse Rate 98     Resp 16     Temp 98.6 F (37 C)     Temp src      SpO2 100 %     Weight      Height      Head Circumference      Peak Flow      Pain Score 9     Pain Loc      Pain Edu?      Excl. in GC?    No data found.  Updated Vital Signs BP (!) 128/91   Pulse 98   Temp 98.6 F (37 C)   Resp 16   SpO2 100%   Visual Acuity Right Eye Distance:   Left Eye Distance:   Bilateral Distance:    Right Eye Near:   Left Eye Near:    Bilateral Near:     Physical Exam  Constitutional: He is oriented to person, place, and time. He appears well-developed and well-nourished.  HENT:  Head: Normocephalic and atraumatic.  Eyes: EOM are normal.  Neck: Normal range of motion. Neck supple. Muscular tenderness present. No spinous process tenderness present. No neck rigidity. Normal range of motion present.    No spinal tenderness. Tenderness to bilateral cervical muscles    Cardiovascular: Normal rate.  Pulmonary/Chest: Effort normal.  Musculoskeletal: Normal range of motion. He exhibits tenderness.  No spinal  tenderness. Tenderness to bilateral cervical muscles. Full ROM upper and lower extremities bilaterally.   Neurological: He is alert and oriented to person, place, and time.  Skin: Skin is warm and dry.  Psychiatric: He has a normal mood and affect. His behavior is normal.  Nursing note and vitals reviewed.    UC Treatments / Results  Labs (all labs ordered are listed, but only abnormal results  are displayed) Labs Reviewed - No data to display  EKG None  Radiology No results found.  Procedures Procedures (including critical care time)  Medications Ordered in UC Medications - No data to display  Initial Impression / Assessment and Plan / UC Course  I have reviewed the triage vital signs and the nursing notes.  Pertinent labs & imaging results that were available during my care of the patient were reviewed by me and considered in my medical decision making (see chart for details).     Hx and exam c/w neck muscle strain No red flag symptoms Encouraged symptomatic treatment.  BP medication refilled F/u with PCP as previously scheduled.  Final Clinical Impressions(s) / UC Diagnoses   Final diagnoses:  Neck muscle strain, initial encounter  Medication refill     Discharge Instructions      Flexeril (cyclobenzaprine) is a muscle relaxer and may cause drowsiness. Do not drink alcohol, drive, or operate heavy machinery while taking.  Meloxicam (Mobic) is an antiinflammatory to help with pain and inflammation.  Do not take ibuprofen, Advil, Aleve, or any other medications that contain NSAIDs while taking meloxicam as this may cause stomach upset or even ulcers if taken in large amounts for an extended period of time.   Please follow up with your family doctor as previously scheduled for 08/28/18 for recheck of symptoms and ongoing healthcare needs.    ED Prescriptions    Medication Sig Dispense Auth. Provider   cyclobenzaprine (FLEXERIL) 5 MG tablet Take 1-2 tablets  (5-10 mg total) by mouth 2 (two) times daily as needed for muscle spasms. 30 tablet Waylan Rocher O, PA-C   meloxicam (MOBIC) 7.5 MG tablet Take 1-2 tablets (7.5-15 mg total) by mouth daily. 20 tablet Doroteo Glassman,  O, PA-C   lisinopril (PRINIVIL,ZESTRIL) 5 MG tablet Take 1 tablet (5 mg total) by mouth daily. 10 tablet Lurene Shadow, PA-C     Controlled Substance Prescriptions [place] Controlled Substance Registry consulted? Not Applicable   Rolla Plate 08/18/18 1310

## 2018-08-20 ENCOUNTER — Emergency Department (HOSPITAL_COMMUNITY): Admission: EM | Admit: 2018-08-20 | Discharge: 2018-08-20 | Discharge status: Against Medical Advice | Payer: Self-pay

## 2018-08-28 ENCOUNTER — Encounter: Payer: Self-pay | Admitting: Gastroenterology

## 2018-08-31 ENCOUNTER — Other Ambulatory Visit: Payer: Self-pay

## 2018-08-31 ENCOUNTER — Encounter (HOSPITAL_COMMUNITY): Payer: Self-pay | Admitting: Emergency Medicine

## 2018-08-31 ENCOUNTER — Emergency Department (HOSPITAL_COMMUNITY)
Admission: EM | Admit: 2018-08-31 | Discharge: 2018-08-31 | Disposition: A | Discharge status: Home / Self Care | Payer: Self-pay | Attending: Emergency Medicine | Admitting: Emergency Medicine

## 2018-08-31 DIAGNOSIS — Z5321 Procedure and treatment not carried out due to patient leaving prior to being seen by health care provider: Secondary | ICD-10-CM | POA: Insufficient documentation

## 2018-08-31 DIAGNOSIS — I10 Essential (primary) hypertension: Secondary | ICD-10-CM | POA: Insufficient documentation

## 2018-08-31 DIAGNOSIS — R419 Unspecified symptoms and signs involving cognitive functions and awareness: Secondary | ICD-10-CM | POA: Insufficient documentation

## 2018-08-31 NOTE — ED Notes (Signed)
I called patient in the lobby to take back to a room and no one responded 

## 2018-08-31 NOTE — ED Triage Notes (Signed)
Pt presents with flushing and htn. Patient with hx of. Patient states he took his BP meds and a dose of ativan today due to feeling anxious. Patient states no improvement with medications.

## 2018-09-09 ENCOUNTER — Ambulatory Visit (HOSPITAL_COMMUNITY)
Admission: EM | Admit: 2018-09-09 | Discharge: 2018-09-09 | Disposition: A | Discharge status: Home / Self Care | Payer: Self-pay | Attending: Physician Assistant | Admitting: Physician Assistant

## 2018-09-09 ENCOUNTER — Encounter (HOSPITAL_COMMUNITY): Payer: Self-pay | Admitting: Emergency Medicine

## 2018-09-09 DIAGNOSIS — I10 Essential (primary) hypertension: Secondary | ICD-10-CM

## 2018-09-09 DIAGNOSIS — R1013 Epigastric pain: Secondary | ICD-10-CM | POA: Insufficient documentation

## 2018-09-09 DIAGNOSIS — R42 Dizziness and giddiness: Secondary | ICD-10-CM

## 2018-09-09 LAB — GLUCOSE, CAPILLARY: Glucose-Capillary: 148 mg/dL — ABNORMAL HIGH (ref 70–99)

## 2018-09-09 MED ORDER — FAMOTIDINE 20 MG PO TABS: 20.0000 mg | ORAL_TABLET | Freq: Every day | ORAL | 0 refills | Status: DC

## 2018-09-09 NOTE — ED Triage Notes (Signed)
Pt here for dizziness; pt seen multiple times in past for same; pt sts took htn meds 1 hour ago; pt requesting CBG also

## 2018-09-09 NOTE — ED Provider Notes (Signed)
[facility]    CSN: [id number] Arrival date & time: 09/09/18  1943     History   Chief Complaint Chief Complaint  Patient presents with  . Dizziness    HPI Wesley [name] is a 52 y.o. male.   52 year old male with frequent ED visit for anxiety, dizziness, chronic neck pain, HTN comes in with cc of dizziness. When seeing patient, he complains of epigastric pain that is worse with eating. States he this is worse with spicy food and that he has frequently eating spicy food. States nausea after eating without vomiting. Denies fever, chills, night sweats. Denies diarrhea, constipation. He also complains of neck pain that is similar to what he has had in the past. States he no longer works, so he is not sure where the pain is coming from. He denies injury/trauma. Denies numbness/tingling to the fingers, loss of grip strength. He would like his CBG checked as he feels bad after eating. Denies history of diabetes. Last eaten 1-2 hours ago.      Past Medical History:  Diagnosis Date  . Alcohol abuse   . Anxiety   . Anxiety   . Chronic neck pain   . Cocaine abuse (HCC)   . Dizziness   . Hypercholesteremia   . Hypertension   . Prediabetes     Patient Active Problem List   Diagnosis Date Noted  . Legally blind in right eye, as defined in [place] 05/02/2017  . Prediabetes 04/23/2017  . Muscle spasm 04/23/2017  . Essential hypertension 04/16/2017  . H/O medication noncompliance 04/02/2017  . Vitamin D deficiency 04/02/2017  . DJD (degenerative joint disease) of cervical spine 03/16/2017  . 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 Dizziness 01/23/2017  . Anxiety 11/21/2016    Past Surgical History:  Procedure Laterality Date  . EYE SURGERY Right ~ 1980   "hit my eye"       Home Medications    Prior to Admission medications   Medication Sig Start Date End Date Taking? Authorizing Provider  acetaminophen (TYLENOL) 500 MG tablet Take 1,000 mg by mouth daily as needed (back pain).     [provider]  amLODipine (NORVASC) 10 MG tablet Take 1 tablet (10 mg total) by mouth daily. 06/27/18 09/25/18  Hoy Register, MD  cetirizine (ZYRTEC ALLERGY) 10 MG tablet Take 1 tablet (10 mg total) by mouth daily. Patient not taking: Reported on 08/02/2018 07/22/18   Wallis Bamberg, PA-C  cyclobenzaprine (FLEXERIL) 10 MG tablet Take 1 tablet (10 mg total) by mouth 2 (two) times daily as needed for muscle spasms. Patient not taking: Reported on 08/02/2018 07/18/18   Wieters, Hallie C, PA-C  cyclobenzaprine (FLEXERIL) 10 MG tablet Take 1 tablet (10 mg total) by mouth 2 (two) times daily as needed for muscle spasms. 08/02/18   Tegeler, Canary Brim, MD  cyclobenzaprine (FLEXERIL) 5 MG tablet Take 1-2 tablets (5-10 mg total) by mouth 2 (two) times daily as needed for muscle spasms. 08/18/18   Lurene Shadow, PA-C  famotidine (PEPCID) 20 MG tablet Take 1 tablet (20 mg total) by mouth daily. 09/09/18   Cathie Hoops, Amy V, PA-C  fluticasone (FLONASE) 50 MCG/ACT nasal spray Place 2 sprays into both nostrils daily. Patient not taking: Reported on 08/02/2018 07/22/18   Wallis Bamberg, PA-C  hydroxypropyl methylcellulose / hypromellose (ISOPTO TEARS / GONIOVISC) 2.5 % ophthalmic solution Place 1 drop into both eyes 3 (three) times daily as needed for dry eyes.    [provider]  lisinopril (  PRINIVIL,ZESTRIL) 5 MG tablet Take 1 tablet (5 mg total) by mouth daily. 08/18/18   Lurene ShadowPhelps, Erin O, PA-C  LORazepam (ATIVAN) 0.5 MG tablet Take 1 tablet (0.5 mg total) by mouth 2 (two) times daily as needed for anxiety. Patient not taking: Reported on 08/02/2018 07/22/18   Wallis BambergMani, Mario, PA-C  meclizine (ANTIVERT) 25 MG tablet Take 1 tablet (25 mg total) by mouth 3 (three) times daily as needed for dizziness. 06/28/18   Elvina SidleLauenstein, Kurt, MD  meloxicam (MOBIC) 7.5 MG tablet Take 1-2 tablets (7.5-15 mg total) by mouth daily. 08/18/18   Lurene ShadowPhelps, Erin O, PA-C  Olopatadine HCl 0.2 % SOLN Apply 1 drop to eye daily. Patient  not taking: Reported on 08/02/2018 07/17/18   Mardella LaymanHagler, Brian, MD  omeprazole (PRILOSEC) 20 MG capsule Take 1 capsule (20 mg total) by mouth daily. Patient not taking: Reported on 08/02/2018 05/28/18   Petrucelli, Pleas KochSamantha R, PA-C  pseudoephedrine (SUDAFED 12 HOUR) 120 MG 12 hr tablet Take 1 tablet (120 mg total) by mouth 2 (two) times daily. Patient not taking: Reported on 08/02/2018 07/22/18   Wallis BambergMani, Mario, PA-C  tobramycin (TOBREX) 0.3 % ophthalmic solution Place 1 drop into the right eye every 4 (four) hours. Patient not taking: Reported on 08/02/2018 07/08/18   Elvina SidleLauenstein, Kurt, MD    Family History Family History  Problem Relation Age of Onset  . Hypertension Mother   . Hypertension Father     Social History Social History   Tobacco Use  . Smoking status: Former Games developermoker  . Smokeless tobacco: Never Used  Substance Use Topics  . Alcohol use: Not Currently    Comment: former  . Drug use: Not Currently    Comment: denies, reports past history      Allergies   Ibuprofen; Other; and Zofran [ondansetron hcl]   Review of Systems Review of Systems  Reason unable to perform ROS: See HPI as above.     Physical Exam Triage Vital Signs ED Triage Vitals  Enc Vitals Group     BP 09/09/18 2012 (!) 150/108     Pulse Rate 09/09/18 2012 97     Resp 09/09/18 2012 18     Temp 09/09/18 2012 97.9 F (36.6 C)     Temp Source 09/09/18 2012 Oral     SpO2 09/09/18 2012 98 %     Weight --      Height --      Head Circumference --      Peak Flow --      Pain Score 09/09/18 2013 0     Pain Loc --      Pain Edu? --      Excl. in GC? --    No data found.  Updated Vital Signs BP (!) 150/108 (BP Location: Left Arm)   Pulse 97   Temp 97.9 F (36.6 C) (Oral)   Resp 18   SpO2 98%   Physical Exam Constitutional:      General: He is not in acute distress.    Appearance: He is well-developed. He is not ill-appearing, toxic-appearing or diaphoretic.  HENT:     Head: Normocephalic  and atraumatic.     Right Ear: Tympanic membrane, ear canal and external ear normal. Tympanic membrane is not erythematous or bulging.     Left Ear: Tympanic membrane, ear canal and external ear normal. Tympanic membrane is not erythematous or bulging.     Nose: Nose normal.     Right Sinus: No maxillary sinus tenderness  or frontal sinus tenderness.     Left Sinus: No maxillary sinus tenderness or frontal sinus tenderness.     Mouth/Throat:     Pharynx: Uvula midline.  Eyes:     Extraocular Movements: Extraocular movements intact.     Conjunctiva/sclera: Conjunctivae normal.     Pupils: Pupils are equal, round, and reactive to light.  Neck:     Musculoskeletal: Normal range of motion and neck supple.     Comments: Diffuse tenderness to palpation of neck that is distractible.  Cardiovascular:     Rate and Rhythm: Normal rate and regular rhythm.     Heart sounds: Normal heart sounds. No murmur. No friction rub. No gallop.   Pulmonary:     Effort: Pulmonary effort is normal.     Breath sounds: Normal breath sounds. No decreased breath sounds, wheezing, rhonchi or rales.  Abdominal:     General: Bowel sounds are normal.     Palpations: Abdomen is soft.     Tenderness: There is no abdominal tenderness. There is no guarding or rebound.  Skin:    General: Skin is warm and dry.  Neurological:     Mental Status: He is alert and oriented to person, place, and time.     GCS: GCS eye subscore is 4. GCS verbal subscore is 5. GCS motor subscore is 6.     Cranial Nerves: Cranial nerves are intact.     Sensory: Sensation is intact.     Motor: Motor function is intact.     Coordination: Coordination is intact.     Gait: Gait is intact.  Psychiatric:        Behavior: Behavior normal.        Judgment: Judgment normal.      UC Treatments / Results  Labs (all labs ordered are listed, but only abnormal results are displayed) Labs Reviewed  GLUCOSE, CAPILLARY - Abnormal; Notable for the  following components:      Result Value   Glucose-Capillary 148 (*)    All other components within normal limits  CBG MONITORING, ED    EKG None  Radiology No results found.  Procedures Procedures (including critical care time)  Medications Ordered in UC Medications - No data to display  Initial Impression / Assessment and Plan / UC Course  I have reviewed the triage vital signs and the nursing notes.  Pertinent labs & imaging results that were available during my care of the patient were reviewed by me and considered in my medical decision making (see chart for details).    No alarming signs on exam. Will provide some pepcid to help with possible GERD. Will have patient follow up with PCP for further management of chronic complaints. Return precautions given.   Final Clinical Impressions(s) / UC Diagnoses   Final diagnoses:  Epigastric pain    ED Prescriptions    Medication Sig Dispense Auth. Provider   famotidine (PEPCID) 20 MG tablet Take 1 tablet (20 mg total) by mouth daily. 10 tablet Threasa AlphaYu, Amy V, PA-C        Yu, Amy V, [place]PA-C 09/09/18 2039

## 2018-09-09 NOTE — Discharge Instructions (Signed)
No alarming signs on exam. Start pepcid to help with acid reflux. Refrain from spicy food, coffee, sodas. Follow up with PCP for further evaluation of chronic neck pain, dizziness, abdominal pain.

## 2018-09-16 NOTE — Progress Notes (Deleted)
Patient ID: Wesley [name], male   DOB: [birth year], 53 y.o.   MRN: [id number]  ED most recently 09/09/2018.  Work-up unremarkable and c/w GERD.   53 year old male with frequent ED visit for anxiety, dizziness, chronic neck pain, HTN comes in with cc of dizziness. When seeing patient, he complains of epigastric pain that is worse with eating. States he this is worse with spicy food and that he has frequently eating spicy food. States nausea after eating without vomiting. Denies fever, chills, night sweats. Denies diarrhea, constipation. He also complains of neck pain that is similar to what he has had in the past. States he no longer works, so he is not sure where the pain is coming from. He denies injury/trauma. Denies numbness/tingling to the fingers, loss of grip strength. He would like his CBG checked as he feels bad after eating. Denies history of diabetes. Last eaten 1-2 hours ago.

## 2018-09-18 ENCOUNTER — Inpatient Hospital Stay: Payer: Self-pay

## 2018-09-21 ENCOUNTER — Ambulatory Visit (HOSPITAL_COMMUNITY)
Admission: EM | Admit: 2018-09-21 | Discharge: 2018-09-21 | Disposition: A | Discharge status: Home / Self Care | Payer: Self-pay | Attending: Family Medicine | Admitting: Family Medicine

## 2018-09-21 ENCOUNTER — Other Ambulatory Visit: Payer: Self-pay

## 2018-09-21 ENCOUNTER — Encounter (HOSPITAL_COMMUNITY): Payer: Self-pay

## 2018-09-21 DIAGNOSIS — R42 Dizziness and giddiness: Secondary | ICD-10-CM | POA: Insufficient documentation

## 2018-09-21 DIAGNOSIS — B372 Candidiasis of skin and nail: Secondary | ICD-10-CM | POA: Insufficient documentation

## 2018-09-21 DIAGNOSIS — I10 Essential (primary) hypertension: Secondary | ICD-10-CM | POA: Insufficient documentation

## 2018-09-21 DIAGNOSIS — Z760 Encounter for issue of repeat prescription: Secondary | ICD-10-CM | POA: Insufficient documentation

## 2018-09-21 LAB — GLUCOSE, CAPILLARY: Glucose-Capillary: 96 mg/dL (ref 70–99)

## 2018-09-21 MED ORDER — MECLIZINE HCL 12.5 MG PO TABS: 12.5000 mg | ORAL_TABLET | Freq: Three times a day (TID) | ORAL | 0 refills | Status: DC | PRN

## 2018-09-21 MED ORDER — NYSTATIN 100000 UNIT/GM EX CREA: 1.0000 "application " | TOPICAL_CREAM | Freq: Two times a day (BID) | CUTANEOUS | 0 refills | Status: DC

## 2018-09-21 MED ORDER — LISINOPRIL 5 MG PO TABS: 5.0000 mg | ORAL_TABLET | Freq: Every day | ORAL | 0 refills | Status: DC

## 2018-09-21 NOTE — Discharge Instructions (Signed)
Please restart lisinopril Apply nystatin cream to rash twice daily for next 2 weeks May use meclizine as needed for dizziness, do not drive or work after taking  Please follow up with primary care as planned on 1/20

## 2018-09-21 NOTE — ED Triage Notes (Signed)
C/o dizziness onset this am.

## 2018-09-22 NOTE — ED Provider Notes (Signed)
[facility]    CSN: [id number] Arrival date & time: 09/21/18  1517     History   Chief Complaint Chief Complaint  Patient presents with  . Dizziness    HPI Wesley [name] is a 53 y.o. male history of hypertension, anxiety, presenting today for evaluation of dizziness, medication refill and rash.  Patient states that this morning while he was driving he began to feel dizzy.  Describes his dizziness as slightly unsteady feeling.  Denies lightheadedness or spinning sensation.  States that he also has associated sensations of his face feeling hot and tightness in his neck.  The way he describes this is similar to his previous presentations.  He denies any vision changes.  Denies any chest pain or shortness of breath.  Denies any nausea or vomiting.  States he has been diagnosed with borderline diabetes.  He states that he ran out of his lisinopril.   He is also concerned about a rash on his lower abdomen.  States that it is very itchy.  Has been there for a few weeks.  He has not tried anything on it yet.  HPI  Past Medical History:  Diagnosis Date  . Alcohol abuse   . Anxiety   . Anxiety   . Chronic neck pain   . Cocaine abuse (HCC)   . Dizziness   . Hypercholesteremia   . Hypertension   . Prediabetes     Patient Active Problem List   Diagnosis Date Noted  . Legally blind in right eye, as defined in [place] 05/02/2017  . Prediabetes 04/23/2017  . Muscle spasm 04/23/2017  . Essential hypertension 04/16/2017  . H/O medication noncompliance 04/02/2017  . Vitamin D deficiency 04/02/2017  . DJD (degenerative joint disease) of cervical spine 03/16/2017  . 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 Dizziness 01/23/2017  . Anxiety 11/21/2016    Past Surgical History:  Procedure Laterality Date  . EYE SURGERY Right ~ 1980   "hit my eye"       Home Medications    Prior to Admission medications   Medication Sig Start Date End Date Taking? Authorizing Provider  acetaminophen  (TYLENOL) 500 MG tablet Take 1,000 mg by mouth daily as needed (back pain).    [provider]  amLODipine (NORVASC) 10 MG tablet Take 1 tablet (10 mg total) by mouth daily. 06/27/18 09/25/18  Hoy Register, MD  cyclobenzaprine (FLEXERIL) 5 MG tablet Take 1-2 tablets (5-10 mg total) by mouth 2 (two) times daily as needed for muscle spasms. 08/18/18   Lurene Shadow, PA-C  famotidine (PEPCID) 20 MG tablet Take 1 tablet (20 mg total) by mouth daily. 09/09/18   Cathie Hoops, Amy V, PA-C  hydroxypropyl methylcellulose / hypromellose (ISOPTO TEARS / GONIOVISC) 2.5 % ophthalmic solution Place 1 drop into both eyes 3 (three) times daily as needed for dry eyes.    [provider]  lisinopril (PRINIVIL,ZESTRIL) 5 MG tablet Take 1 tablet (5 mg total) by mouth daily. 09/21/18   Wieters, Hallie C, PA-C  LORazepam (ATIVAN) 0.5 MG tablet Take 1 tablet (0.5 mg total) by mouth 2 (two) times daily as needed for anxiety. Patient not taking: Reported on 08/02/2018 07/22/18   Wallis Bamberg, PA-C  meclizine (ANTIVERT) 12.5 MG tablet Take 1 tablet (12.5 mg total) by mouth 3 (three) times daily as needed for dizziness. 09/21/18   Wieters, Hallie C, PA-C  meloxicam (MOBIC) 7.5 MG tablet Take 1-2 tablets (7.5-15 mg total) by mouth daily. 08/18/18   Lurene Shadow,  PA-C  nystatin cream (MYCOSTATIN) Apply 1 application topically 2 (two) times daily. 09/21/18   Wieters, Junius Creamer, PA-C    Family History Family History  Problem Relation Age of Onset  . Hypertension Mother   . Hypertension Father     Social History Social History   Tobacco Use  . Smoking status: Former Games developer  . Smokeless tobacco: Never Used  Substance Use Topics  . Alcohol use: Not Currently    Comment: former  . Drug use: Not Currently    Comment: denies, reports past history      Allergies   Ibuprofen; Other; and Zofran [ondansetron hcl]   Review of Systems Review of Systems  Constitutional: Negative for fatigue and fever.  HENT:  Negative for congestion, sinus pressure and sore throat.   Eyes: Negative for photophobia, pain, redness, itching and visual disturbance.  Respiratory: Negative for cough and shortness of breath.   Cardiovascular: Negative for chest pain and leg swelling.  Gastrointestinal: Negative for abdominal pain, nausea and vomiting.  Genitourinary: Negative for decreased urine volume and hematuria.  Musculoskeletal: Negative for arthralgias, myalgias, neck pain and neck stiffness.  Skin: Positive for color change and rash. Negative for wound.  Neurological: Positive for dizziness. Negative for syncope, facial asymmetry, speech difficulty, weakness, light-headedness, numbness and headaches.     Physical Exam Triage Vital Signs ED Triage Vitals  Enc Vitals Group     BP 09/21/18 1639 125/85     Pulse Rate 09/21/18 1639 90     Resp 09/21/18 1639 16     Temp 09/21/18 1639 98.2 F (36.8 C)     Temp Source 09/21/18 1639 Oral     SpO2 09/21/18 1639 96 %     Weight --      Height --      Head Circumference --      Peak Flow --      Pain Score 09/21/18 1640 8     Pain Loc --      Pain Edu? --      Excl. in GC? --    No data found.  Updated Vital Signs BP 125/85 (BP Location: Right Arm)   Pulse 90   Temp 98.2 F (36.8 C) (Oral)   Resp 16   SpO2 96%   Visual Acuity Right Eye Distance:   Left Eye Distance:   Bilateral Distance:    Right Eye Near:   Left Eye Near:    Bilateral Near:     Physical Exam Vitals signs and nursing note reviewed.  Constitutional:      Appearance: He is well-developed.  HENT:     Head: Normocephalic and atraumatic.     Mouth/Throat:     Comments: Oral mucosa pink and moist, no tonsillar enlargement or exudate. Posterior pharynx patent and nonerythematous, no uvula deviation or swelling. Normal phonation. Eyes:     Conjunctiva/sclera: Conjunctivae normal.  Neck:     Musculoskeletal: Neck supple.  Cardiovascular:     Rate and Rhythm: Normal rate and  regular rhythm.     Heart sounds: No murmur.  Pulmonary:     Effort: Pulmonary effort is normal. No respiratory distress.     Breath sounds: Normal breath sounds.     Comments: Breathing comfortably at rest, CTABL, no wheezing, rales or other adventitious sounds auscultated Abdominal:     Palpations: Abdomen is soft.     Tenderness: There is no abdominal tenderness.  Skin:    General: Skin is warm and dry.  Comments: Lower abdomen/belt line with erythematous, hyperpigmented rash, few areas of broken skin likely from excoriation  Neurological:     Mental Status: He is alert.      UC Treatments / Results  Labs (all labs ordered are listed, but only abnormal results are displayed) Labs Reviewed  GLUCOSE, CAPILLARY  CBG MONITORING, ED    EKG None  Radiology No results found.  Procedures Procedures (including critical care time)  Medications Ordered in UC Medications - No data to display  Initial Impression / Assessment and Plan / UC Course  I have reviewed the triage vital signs and the nursing notes.  Pertinent labs & imaging results that were available during my care of the patient were reviewed by me and considered in my medical decision making (see chart for details).    Patient provided nystatin to apply to rash as this does appear yeast/fungal.  Refilled lisinopril to continue to take for his blood pressure.  Patient has been in and out of the emergency room and the urgent care many times for similar dizziness sensations.  He receives lorazepam for anxiety from his psychiatrist.  He is states he has plans to follow-up with his primary care on 1/20.  He said he had improvement of his dizziness in the past with meclizine, will refill this for him.  He does not likely need further work-up given symptoms similar.  Vital signs stable, no warning signs or new signs indicating further work-up.  Discussed strict return precautions. Patient verbalized understanding and is  agreeable with plan.   Final Clinical Impressions(s) / UC Diagnoses   Final diagnoses:  Dizziness  Yeast dermatitis  Medication refill     Discharge Instructions     Please restart lisinopril Apply nystatin cream to rash twice daily for next 2 weeks May use meclizine as needed for dizziness, do not drive or work after taking  Please follow up with primary care as planned on 1/20   ED Prescriptions    Medication Sig Dispense Auth. Provider   nystatin cream (MYCOSTATIN) Apply 1 application topically 2 (two) times daily. 30 g Wieters, Hallie C, PA-C   lisinopril (PRINIVIL,ZESTRIL) 5 MG tablet Take 1 tablet (5 mg total) by mouth daily. 30 tablet Wieters, Hallie C, PA-C   meclizine (ANTIVERT) 12.5 MG tablet Take 1 tablet (12.5 mg total) by mouth 3 (three) times daily as needed for dizziness. 30 tablet Wieters, MansuraHallie C, PA-C     Controlled Substance Prescriptions [place] Controlled Substance Registry consulted? Not Applicable   Lew DawesWieters, Hallie C, [place]PA-C 09/22/18 [id number]

## 2018-09-26 ENCOUNTER — Ambulatory Visit: Payer: Self-pay | Admitting: Gastroenterology

## 2018-09-30 ENCOUNTER — Encounter (HOSPITAL_COMMUNITY): Payer: Self-pay | Admitting: Emergency Medicine

## 2018-09-30 ENCOUNTER — Emergency Department (HOSPITAL_COMMUNITY)
Admission: EM | Admit: 2018-09-30 | Discharge: 2018-09-30 | Disposition: A | Discharge status: Home / Self Care | Payer: Self-pay | Attending: Emergency Medicine | Admitting: Emergency Medicine

## 2018-09-30 ENCOUNTER — Other Ambulatory Visit: Payer: Self-pay

## 2018-09-30 DIAGNOSIS — I10 Essential (primary) hypertension: Secondary | ICD-10-CM | POA: Insufficient documentation

## 2018-09-30 DIAGNOSIS — Z87891 Personal history of nicotine dependence: Secondary | ICD-10-CM | POA: Insufficient documentation

## 2018-09-30 DIAGNOSIS — Z79899 Other long term (current) drug therapy: Secondary | ICD-10-CM | POA: Insufficient documentation

## 2018-09-30 DIAGNOSIS — R42 Dizziness and giddiness: Secondary | ICD-10-CM

## 2018-09-30 MED ORDER — SODIUM CHLORIDE 0.9 % IV BOLUS: 1000.0000 mL | Freq: Once | INTRAVENOUS | Status: DC

## 2018-09-30 NOTE — ED Notes (Signed)
Patient verbalizes understanding of discharge instructions. Opportunity for questioning and answers were provided. Armband removed by staff, pt discharged from ED ambulatory.   

## 2018-09-30 NOTE — Discharge Instructions (Addendum)
Return here as needed. Follow up with your doctor. °

## 2018-09-30 NOTE — ED Triage Notes (Signed)
C/o dizziness and high BP x 1 hour.  No neuro deficits noted on triage exam.  Pt seen at [facility] on 1/11 for dizziness and received Rx for Meclizine.

## 2018-10-01 NOTE — ED Provider Notes (Signed)
[facility] EMERGENCY DEPARTMENT Provider Note   CSN: [id number] Arrival date & time: 09/30/18  5830     History   Chief Complaint Chief Complaint  Patient presents with  . Dizziness  . Hypertension    HPI Wesley [name] is a 53 y.o. male.  HPI Patient presents to the emergency department with dizziness that occurred around 3:30 AM.  Patient states that he did take a meclizine along with his blood pressure medications.  The patient states that nothing seems to make the condition better or worse.  Patient states he is feeling better at this time.  Patient states he took his blood pressure when this is occurring and it was 195/110.  The patient denies chest pain, shortness of breath, headache,blurred vision, neck pain, fever, cough, weakness, numbness,anorexia, edema, abdominal pain, nausea, vomiting, diarrhea, rash, back pain, dysuria, hematemesis, bloody stool, near syncope, or syncope. Past Medical History:  Diagnosis Date  . Alcohol abuse   . Anxiety   . Anxiety   . Chronic neck pain   . Cocaine abuse (HCC)   . Dizziness   . Hypercholesteremia   . Hypertension   . Prediabetes     Patient Active Problem List   Diagnosis Date Noted  . Legally blind in right eye, as defined in [place] 05/02/2017  . Prediabetes 04/23/2017  . Muscle spasm 04/23/2017  . Essential hypertension 04/16/2017  . H/O medication noncompliance 04/02/2017  . Vitamin D deficiency 04/02/2017  . DJD (degenerative joint disease) of cervical spine 03/16/2017  . 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 Dizziness 01/23/2017  . Anxiety 11/21/2016    Past Surgical History:  Procedure Laterality Date  . EYE SURGERY Right ~ 1980   "hit my eye"        Home Medications    Prior to Admission medications   Medication Sig Start Date End Date Taking? Authorizing Provider  acetaminophen (TYLENOL) 500 MG tablet Take 1,000 mg by mouth daily as needed (back pain).    [provider]  amLODipine  (NORVASC) 10 MG tablet Take 1 tablet (10 mg total) by mouth daily. 06/27/18 09/25/18  Hoy Register, MD  cyclobenzaprine (FLEXERIL) 5 MG tablet Take 1-2 tablets (5-10 mg total) by mouth 2 (two) times daily as needed for muscle spasms. 08/18/18   Lurene Shadow, PA-C  famotidine (PEPCID) 20 MG tablet Take 1 tablet (20 mg total) by mouth daily. 09/09/18   Cathie Hoops, Amy V, PA-C  hydroxypropyl methylcellulose / hypromellose (ISOPTO TEARS / GONIOVISC) 2.5 % ophthalmic solution Place 1 drop into both eyes 3 (three) times daily as needed for dry eyes.    [provider]  lisinopril (PRINIVIL,ZESTRIL) 5 MG tablet Take 1 tablet (5 mg total) by mouth daily. 09/21/18   Wieters, Hallie C, PA-C  LORazepam (ATIVAN) 0.5 MG tablet Take 1 tablet (0.5 mg total) by mouth 2 (two) times daily as needed for anxiety. Patient not taking: Reported on 08/02/2018 07/22/18   Wallis Bamberg, PA-C  meclizine (ANTIVERT) 12.5 MG tablet Take 1 tablet (12.5 mg total) by mouth 3 (three) times daily as needed for dizziness. 09/21/18   Wieters, Hallie C, PA-C  meloxicam (MOBIC) 7.5 MG tablet Take 1-2 tablets (7.5-15 mg total) by mouth daily. 08/18/18   Lurene Shadow, PA-C  nystatin cream (MYCOSTATIN) Apply 1 application topically 2 (two) times daily. 09/21/18   Wieters, Junius Creamer, PA-C    Family History Family History  Problem Relation Age of Onset  . Hypertension Mother   . Hypertension  Father     Social History Social History   Tobacco Use  . Smoking status: Former Games developermoker  . Smokeless tobacco: Never Used  Substance Use Topics  . Alcohol use: Not Currently    Comment: former  . Drug use: Not Currently    Comment: denies, reports past history      Allergies   Ibuprofen; Other; and Zofran [ondansetron hcl]   Review of Systems Review of Systems  All other systems negative except as documented in the HPI. All pertinent positives and negatives as reviewed in the HPI. Physical Exam Updated Vital Signs BP (!) 134/95    Pulse 90   Temp 98.1 F (36.7 C) (Oral)   Resp (!) 21   SpO2 98%   Physical Exam Vitals signs and nursing note reviewed.  Constitutional:      General: He is not in acute distress.    Appearance: He is well-developed.  HENT:     Head: Normocephalic and atraumatic.  Eyes:     Pupils: Pupils are equal, round, and reactive to light.  Neck:     Musculoskeletal: Normal range of motion and neck supple.  Cardiovascular:     Rate and Rhythm: Normal rate and regular rhythm.     Heart sounds: Normal heart sounds. No murmur. No friction rub. No gallop.   Pulmonary:     Effort: Pulmonary effort is normal. No respiratory distress.     Breath sounds: Normal breath sounds. No wheezing.  Abdominal:     General: Bowel sounds are normal. There is no distension.     Palpations: Abdomen is soft.     Tenderness: There is no abdominal tenderness.  Skin:    General: Skin is warm and dry.     Capillary Refill: Capillary refill takes less than 2 seconds.     Findings: No erythema or rash.  Neurological:     Mental Status: He is alert and oriented to person, place, and time.     Motor: No abnormal muscle tone.     Coordination: Coordination normal.  Psychiatric:        Behavior: Behavior normal.      ED Treatments / Results  Labs (all labs ordered are listed, but only abnormal results are displayed) Labs Reviewed - No data to display  EKG EKG Interpretation  Date/Time:  Monday September 30 2018 04:46:43 EST Ventricular Rate:  107 PR Interval:  128 QRS Duration: 102 QT Interval:  330 QTC Calculation: 440 R Axis:   -43 Text Interpretation:  Sinus tachycardia Left axis deviation Minimal voltage criteria for LVH, may be normal variant Abnormal ECG similar to previous Confirmed by Marily MemosMesner, Jason [phone]) on 09/30/2018 5:38:15 AM Also confirmed by Marily MemosMesner, Jason [phone]), editor Barbette Hairassel, Kerry [phone])  on 09/30/2018 7:43:30 AM   Radiology No results found.  Procedures Procedures (including  critical care time)  Medications Ordered in ED Medications - No data to display   Initial Impression / Assessment and Plan / ED Course  I have reviewed the triage vital signs and the nursing notes.  Pertinent labs & imaging results that were available during my care of the patient were reviewed by me and considered in my medical decision making (see chart for details).     Patient refuses any laboratory testing or fluids.  The patient will be discharged home advised that he will need to see his primary doctor.  I read in his notes that he does have an appointment with the primary doctor today.  I have advised him to return for any worsening in his condition.  Final Clinical Impressions(s) / ED Diagnoses   Final diagnoses:  Essential hypertension  Lightheadedness    ED Discharge Orders    None       Charlestine NightLawyer, Christopher, PA-C 10/01/18 1522    Margarita Grizzleay, Danielle, MD 10/01/18 [phone]

## 2018-10-17 ENCOUNTER — Ambulatory Visit (HOSPITAL_COMMUNITY)
Admission: EM | Admit: 2018-10-17 | Discharge: 2018-10-17 | Disposition: A | Discharge status: Home / Self Care | Payer: Self-pay | Attending: Family Medicine | Admitting: Family Medicine

## 2018-10-17 ENCOUNTER — Other Ambulatory Visit: Payer: Self-pay

## 2018-10-17 ENCOUNTER — Encounter (HOSPITAL_COMMUNITY): Payer: Self-pay

## 2018-10-17 DIAGNOSIS — R059 Cough, unspecified: Secondary | ICD-10-CM

## 2018-10-17 DIAGNOSIS — R05 Cough: Secondary | ICD-10-CM

## 2018-10-17 DIAGNOSIS — L230 Allergic contact dermatitis due to metals: Secondary | ICD-10-CM

## 2018-10-17 DIAGNOSIS — I10 Essential (primary) hypertension: Secondary | ICD-10-CM

## 2018-10-17 MED ORDER — AMLODIPINE BESYLATE 10 MG PO TABS: 10.0000 mg | ORAL_TABLET | Freq: Every day | ORAL | 0 refills | Status: DC

## 2018-10-17 MED ORDER — LISINOPRIL 5 MG PO TABS: 5.0000 mg | ORAL_TABLET | Freq: Every day | ORAL | 0 refills | Status: DC

## 2018-10-17 MED ORDER — TRIAMCINOLONE ACETONIDE 0.1 % EX CREA: 1.0000 "application " | TOPICAL_CREAM | Freq: Two times a day (BID) | CUTANEOUS | 0 refills | Status: DC

## 2018-10-17 MED ORDER — BENZONATATE 100 MG PO CAPS: ORAL_CAPSULE | ORAL | 0 refills | Status: DC

## 2018-10-17 NOTE — ED Triage Notes (Signed)
Pt cc he is out of one of his b/p meds and he feels dizzy from time to time. This started today about 2 hours ago.

## 2018-10-21 ENCOUNTER — Other Ambulatory Visit: Payer: Self-pay

## 2018-10-21 ENCOUNTER — Encounter (HOSPITAL_COMMUNITY): Payer: Self-pay

## 2018-10-21 ENCOUNTER — Emergency Department (HOSPITAL_COMMUNITY)
Admission: EM | Admit: 2018-10-21 | Discharge: 2018-10-21 | Disposition: A | Discharge status: Home / Self Care | Payer: Self-pay | Attending: Emergency Medicine | Admitting: Emergency Medicine

## 2018-10-21 DIAGNOSIS — Z5321 Procedure and treatment not carried out due to patient leaving prior to being seen by health care provider: Secondary | ICD-10-CM | POA: Insufficient documentation

## 2018-10-21 DIAGNOSIS — R05 Cough: Secondary | ICD-10-CM | POA: Insufficient documentation

## 2018-10-21 NOTE — ED Triage Notes (Signed)
Pt reports cough x 3 days. Pt reports some dizziness and states that he is hypertensive.

## 2018-10-21 NOTE — ED Notes (Signed)
Pt not visualized in lobby 

## 2018-10-27 ENCOUNTER — Other Ambulatory Visit: Payer: Self-pay

## 2018-10-27 ENCOUNTER — Encounter (HOSPITAL_COMMUNITY): Payer: Self-pay | Admitting: *Deleted

## 2018-10-27 ENCOUNTER — Emergency Department (HOSPITAL_COMMUNITY)
Admission: EM | Admit: 2018-10-27 | Discharge: 2018-10-27 | Disposition: A | Discharge status: Home / Self Care | Payer: Self-pay | Attending: Emergency Medicine | Admitting: Emergency Medicine

## 2018-10-27 DIAGNOSIS — Z87891 Personal history of nicotine dependence: Secondary | ICD-10-CM | POA: Insufficient documentation

## 2018-10-27 DIAGNOSIS — R05 Cough: Secondary | ICD-10-CM | POA: Insufficient documentation

## 2018-10-27 DIAGNOSIS — R058 Other specified cough: Secondary | ICD-10-CM

## 2018-10-27 DIAGNOSIS — Z79899 Other long term (current) drug therapy: Secondary | ICD-10-CM | POA: Insufficient documentation

## 2018-10-27 DIAGNOSIS — I10 Essential (primary) hypertension: Secondary | ICD-10-CM | POA: Insufficient documentation

## 2018-10-27 NOTE — ED Triage Notes (Signed)
Pt has had a productive cough for the past 6 days unrelieved by OTC medications. Also reports his bp is high

## 2018-10-27 NOTE — ED Provider Notes (Signed)
[facility] EMERGENCY DEPARTMENT Provider Note   CSN: [id number] Arrival date & time: 10/27/18  0503     History   Chief Complaint Chief Complaint  Patient presents with  . Cough    HPI Wesley [name] is a 53 y.o. male is here for evaluation of cough.  Onset 6 days ago.  Dry, intermittent, disruptive.  It is worse at nights.  He went to urgent care and was given benzoate which she has been taking with slight relief but states that he is still coughing during the day and night.  He denies sick contacts, recent travel, fever, shortness of breath, chest pain, vomiting, diarrhea, abdominal pain, nasal congestion or sore throat.  No body aches.  No cigarette use.  States 6 days ago he stopped taking his Lorazepam and wonders if this is causing him to cough. No alleviating factors.   HPI  Past Medical History:  Diagnosis Date  . Alcohol abuse   . Anxiety   . Anxiety   . Chronic neck pain   . Cocaine abuse (HCC)   . Dizziness   . Hypercholesteremia   . Hypertension   . Prediabetes     Patient Active Problem List   Diagnosis Date Noted  . Legally blind in right eye, as defined in [place] 05/02/2017  . Prediabetes 04/23/2017  . Muscle spasm 04/23/2017  . Essential hypertension 04/16/2017  . H/O medication noncompliance 04/02/2017  . Vitamin D deficiency 04/02/2017  . DJD (degenerative joint disease) of cervical spine 03/16/2017  . 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 Dizziness 01/23/2017  . Anxiety 11/21/2016    Past Surgical History:  Procedure Laterality Date  . EYE SURGERY Right ~ 1980   "hit my eye"        Home Medications    Prior to Admission medications   Medication Sig Start Date End Date Taking? Authorizing Provider  acetaminophen (TYLENOL) 500 MG tablet Take 1,000 mg by mouth daily as needed (back pain).    [provider]  amLODipine (NORVASC) 10 MG tablet Take 1 tablet (10 mg total) by mouth daily. 10/17/18 01/15/19  Mardella Layman, MD    benzonatate (TESSALON) 100 MG capsule Take 1 capsule by mouth every 8 (eight) hours for cough. 10/17/18   Mardella Layman, MD  cyclobenzaprine (FLEXERIL) 5 MG tablet Take 1-2 tablets (5-10 mg total) by mouth 2 (two) times daily as needed for muscle spasms. 08/18/18   Lurene Shadow, PA-C  famotidine (PEPCID) 20 MG tablet Take 1 tablet (20 mg total) by mouth daily. 09/09/18   Cathie Hoops, Amy V, PA-C  hydroxypropyl methylcellulose / hypromellose (ISOPTO TEARS / GONIOVISC) 2.5 % ophthalmic solution Place 1 drop into both eyes 3 (three) times daily as needed for dry eyes.    [provider]  lisinopril (PRINIVIL,ZESTRIL) 5 MG tablet Take 1 tablet (5 mg total) by mouth daily. 10/17/18   Mardella Layman, MD  LORazepam (ATIVAN) 0.5 MG tablet Take 1 tablet (0.5 mg total) by mouth 2 (two) times daily as needed for anxiety. Patient not taking: Reported on 08/02/2018 07/22/18   Wallis Bamberg, PA-C  meclizine (ANTIVERT) 12.5 MG tablet Take 1 tablet (12.5 mg total) by mouth 3 (three) times daily as needed for dizziness. 09/21/18   Wieters, Hallie C, PA-C  meloxicam (MOBIC) 7.5 MG tablet Take 1-2 tablets (7.5-15 mg total) by mouth daily. 08/18/18   Lurene Shadow, PA-C  nystatin cream (MYCOSTATIN) Apply 1 application topically 2 (two) times daily. 09/21/18   Sharyon Cable, [employer]  C, PA-C  triamcinolone cream (KENALOG) 0.1 % Apply 1 application topically 2 (two) times daily. 10/17/18   Mardella LaymanHagler, Brian, MD    Family History Family History  Problem Relation Age of Onset  . Hypertension Mother   . Hypertension Father     Social History Social History   Tobacco Use  . Smoking status: Former Games developermoker  . Smokeless tobacco: Never Used  Substance Use Topics  . Alcohol use: Not Currently    Comment: former  . Drug use: Not Currently    Comment: denies, reports past history      Allergies   Ibuprofen; Other; and Zofran [ondansetron hcl]   Review of Systems Review of Systems  Respiratory: Positive for cough.   All other  systems reviewed and are negative.    Physical Exam Updated Vital Signs BP (!) 127/98   Pulse 98   Temp 98.2 F (36.8 C) (Oral)   Resp 18   SpO2 98%   Physical Exam Vitals signs and nursing note reviewed.  Constitutional:      General: He is not in acute distress.    Appearance: He is well-developed.     Comments: NAD.  HENT:     Head: Normocephalic and atraumatic.     Right Ear: External ear normal.     Left Ear: External ear normal.     Nose: Nose normal.  Eyes:     General: No scleral icterus.    Conjunctiva/sclera: Conjunctivae normal.  Neck:     Musculoskeletal: Normal range of motion and neck supple.  Cardiovascular:     Rate and Rhythm: Normal rate and regular rhythm.     Heart sounds: Normal heart sounds. No murmur.  Pulmonary:     Effort: Pulmonary effort is normal.     Breath sounds: Normal breath sounds. No wheezing.  Musculoskeletal: Normal range of motion.        General: No deformity.  Skin:    General: Skin is warm and dry.     Capillary Refill: Capillary refill takes less than 2 seconds.  Neurological:     Mental Status: He is alert and oriented to person, place, and time.  Psychiatric:        Behavior: Behavior normal.        Thought Content: Thought content normal.        Judgment: Judgment normal.      ED Treatments / Results  Labs (all labs ordered are listed, but only abnormal results are displayed) Labs Reviewed - No data to display  EKG None  Radiology No results found.  Procedures Procedures (including critical care time)  Medications Ordered in ED Medications - No data to display   Initial Impression / Assessment and Plan / ED Course  I have reviewed the triage vital signs and the nursing notes.  Pertinent labs & imaging results that were available during my care of the patient were reviewed by me and considered in my medical decision making (see chart for details).     53 year old is here with dry cough.  Stopped  taking lorazepam 6 days ago as well and is worried this could be related.He is nontoxic-appearing.  No increased work of breathing.  No fever, tachypnea, tachycardia, hypoxia.  Lungs are clear on exam.  Given reassuring vital signs and exam I do not think chest x-ray is indicated.  I suspect viral etiology.  No significant history of immunocompromise.  I doubt pneumonia or bronchitis.  He has not been outside of  the country. He already has benzoate, encouraged supportive care at home, delsym and /fu with PCP in 1 week for persistent cough. Return precautions given at discharge.   Final Clinical Impressions(s) / ED Diagnoses   Final diagnoses:  Post-viral cough syndrome    ED Discharge Orders    None       Liberty Handy, PA-C 10/27/18 9735    Gilda Crease, MD 10/27/18 [phone]

## 2018-10-27 NOTE — Discharge Instructions (Signed)
You were seen in the ER for cough.   I suspect this is from a virus you had leading to post viral cough.  Continue taking benzoate pills as prescribed.  You may purchase over the counter dextromethorphan (delsym) to help with cough.   Follow up with primary care doctor in 1 week if symptoms do not improve.  Return for fever, shortness of breath, worsening cough, wheezing.

## 2018-11-03 ENCOUNTER — Emergency Department (HOSPITAL_COMMUNITY)
Admission: EM | Admit: 2018-11-03 | Discharge: 2018-11-03 | Discharge status: Against Medical Advice | Payer: Self-pay | Attending: Emergency Medicine | Admitting: Emergency Medicine

## 2018-11-03 ENCOUNTER — Encounter (HOSPITAL_COMMUNITY): Payer: Self-pay

## 2018-11-03 DIAGNOSIS — Z5321 Procedure and treatment not carried out due to patient leaving prior to being seen by health care provider: Secondary | ICD-10-CM | POA: Insufficient documentation

## 2018-11-03 NOTE — ED Notes (Signed)
No answer in waiting roomx1

## 2018-11-03 NOTE — ED Triage Notes (Signed)
Pt states that his BP is high and he took and extra amlodipine. Has a headache.

## 2018-11-05 ENCOUNTER — Other Ambulatory Visit: Payer: Self-pay

## 2018-11-05 ENCOUNTER — Ambulatory Visit (HOSPITAL_COMMUNITY)
Admission: EM | Admit: 2018-11-05 | Discharge: 2018-11-05 | Disposition: A | Discharge status: Home / Self Care | Payer: Self-pay | Attending: Family Medicine | Admitting: Family Medicine

## 2018-11-05 DIAGNOSIS — I10 Essential (primary) hypertension: Secondary | ICD-10-CM

## 2018-11-05 DIAGNOSIS — R42 Dizziness and giddiness: Secondary | ICD-10-CM

## 2018-11-05 DIAGNOSIS — R739 Hyperglycemia, unspecified: Secondary | ICD-10-CM

## 2018-11-05 LAB — GLUCOSE, CAPILLARY: Glucose-Capillary: 117 mg/dL — ABNORMAL HIGH (ref 70–99)

## 2018-11-05 MED ORDER — MECLIZINE HCL 12.5 MG PO TABS: 12.5000 mg | ORAL_TABLET | Freq: Three times a day (TID) | ORAL | 0 refills | Status: DC | PRN

## 2018-11-05 MED ORDER — LISINOPRIL 5 MG PO TABS: 5.0000 mg | ORAL_TABLET | Freq: Every day | ORAL | 0 refills | Status: DC

## 2018-11-05 NOTE — ED Triage Notes (Signed)
Dizziness, no complaints of pain.  Taking medicines as scheduled.

## 2018-11-05 NOTE — ED Provider Notes (Signed)
[facility]   [id number] 10/17/18 Arrival Time: 1621  ASSESSMENT & PLAN:  1. Hypertension, unspecified type   2. Cough   3. Essential hypertension   4. Nickel dermatitis     Meds ordered this encounter  Medications  . amLODipine (NORVASC) 10 MG tablet    Sig: Take 1 tablet (10 mg total) by mouth daily.    Dispense:  90 tablet    Refill:  0  . lisinopril (PRINIVIL,ZESTRIL) 5 MG tablet    Sig: Take 1 tablet (5 mg total) by mouth daily.    Dispense:  90 tablet    Refill:  0  . benzonatate (TESSALON) 100 MG capsule    Sig: Take 1 capsule by mouth every 8 (eight) hours for cough.    Dispense:  21 capsule    Refill:  0  . triamcinolone cream (KENALOG) 0.1 %    Sig: Apply 1 application topically 2 (two) times daily.    Dispense:  30 g    Refill:  0    The patient is reassured that his current symptoms do not appear to represent a serious or threatening condition.   Follow-up Information    Schedule an appointment as soon as possible for a visit  with Claiborne Rigg, NP.   Specialty:  Nurse Practitioner Contact information: [address] [place] [address] [phone]          Reviewed expectations re: course of current medical issues. Questions answered. Outlined signs and symptoms indicating need for more acute intervention. Patient verbalized understanding. After Visit Summary given.   SUBJECTIVE:  Wesley [name] is a 53 y.o. male who reports the recurrent feeling of "feeling a little dizzy". Noticed about 2 hours ago. Symptoms have resolved. No difference from the same symptoms he experiences every once in awhile. He describes his dizziness as lightheadedness. Frequency: once. Today. No specific aggravating or alleviating factors reported. Previous workup/treatments: multiple head scans that have been negative. Associated ear symptoms: none. Associated CNS symptoms: none. He denies otalgia and tinnitus. Head trauma: none. Drug ingestion: none.  Ambulatory without difficulty. No headache this episode. No OTC treatment.  Also requests refills of BP medications.  Also reports a rash of lower abdomen. Not sure how long this has been present. Itches. No OTC tx. No associated pain.  Recent URI. Resolved. Persistent cough over the past week. Interfering with sleep. No SOB/wheezing.  ROS: As per HPI. All other systems negative.  OBJECTIVE:  Vitals:   10/17/18 1635  BP: 123/76  Pulse: 91  Resp: 16  Temp: 97.8 F (36.6 C)  TempSrc: Oral  SpO2: 100%    General appearance: alert; no distress Eyes: PERRLA; EOMI; conjunctiva normal HENT: normocephalic; atraumatic; TMs normal; nasal mucosa normal; oral mucosa normal Neck: supple with FROM Lungs: clear to auscultation bilaterally Heart: regular rate and rhythm Abdomen: soft, non-tender; bowel sounds normal Extremities: no edema; symmetrical with no gross deformities Skin: warm and dry; area of skin irritation of midline lower abdomen where belt buckle contacts skin; no infection Neurologic: normal gait; DTR's normal and symmetric.; CN 2-12 grossly intact Psychological: alert and cooperative; normal mood and affect  No results found.  Allergies  Allergen Reactions  . Ibuprofen Other (See Comments) and Rash    Makes his throat get "very dry" after taking it States he cant take it  . Other Other (See Comments)    Steroids: Patient received a steroid shot from [facility] [phone] and it resulted  in his muscles twitching all over his body  . Zofran [Ondansetron Hcl] Other (See Comments)    Causes sweating and body "feels badly"    Past Medical History:  Diagnosis Date  . Alcohol abuse   . Anxiety   . Anxiety   . Chronic neck pain   . Cocaine abuse (HCC)   . Dizziness   . Hypercholesteremia   . Hypertension   . Prediabetes    Social History   Socioeconomic History  . Marital status: Significant Other    Spouse name: Not on file  . Number of  children: Not on file  . Years of education: Not on file  . Highest education level: Not on file  Occupational History  . Not on file  Social Needs  . Financial resource strain: Not on file  . Food insecurity:    Worry: Not on file    Inability: Not on file  . Transportation needs:    Medical: Not on file    Non-medical: Not on file  Tobacco Use  . Smoking status: Former Games developer  . Smokeless tobacco: Never Used  Substance and Sexual Activity  . Alcohol use: Not Currently    Comment: former  . Drug use: Not Currently    Comment: denies, reports past history   . Sexual activity: Not on file  Lifestyle  . Physical activity:    Days per week: Not on file    Minutes per session: Not on file  . Stress: Not on file  Relationships  . Social connections:    Talks on phone: Not on file    Gets together: Not on file    Attends religious service: Not on file    Active member of club or organization: Not on file    Attends meetings of clubs or organizations: Not on file    Relationship status: Not on file  . Intimate partner violence:    Fear of current or ex partner: Not on file    Emotionally abused: Not on file    Physically abused: Not on file    Forced sexual activity: Not on file  Other Topics Concern  . Not on file  Social History Narrative  . Not on file   Family History  Problem Relation Age of Onset  . Hypertension Mother   . Hypertension Father    Past Surgical History:  Procedure Laterality Date  . EYE SURGERY Right ~ 1980   "hit my eye"      Mardella Layman, MD 11/05/18 [phone]

## 2018-11-06 ENCOUNTER — Other Ambulatory Visit: Payer: Self-pay

## 2018-11-06 ENCOUNTER — Emergency Department (HOSPITAL_COMMUNITY)
Admission: EM | Admit: 2018-11-06 | Discharge: 2018-11-06 | Disposition: A | Discharge status: Home / Self Care | Payer: Self-pay | Attending: Emergency Medicine | Admitting: Emergency Medicine

## 2018-11-06 DIAGNOSIS — Z87891 Personal history of nicotine dependence: Secondary | ICD-10-CM | POA: Insufficient documentation

## 2018-11-06 DIAGNOSIS — Z79899 Other long term (current) drug therapy: Secondary | ICD-10-CM | POA: Insufficient documentation

## 2018-11-06 DIAGNOSIS — F410 Panic disorder [episodic paroxysmal anxiety] without agoraphobia: Secondary | ICD-10-CM | POA: Insufficient documentation

## 2018-11-06 DIAGNOSIS — I10 Essential (primary) hypertension: Secondary | ICD-10-CM | POA: Insufficient documentation

## 2018-11-06 NOTE — ED Notes (Signed)
Called for room X3 with no answer 

## 2018-11-06 NOTE — ED Provider Notes (Signed)
[facility] EMERGENCY DEPARTMENT Provider Note   CSN: [id number] Arrival date & time: 11/06/18  0216    History   Chief Complaint Chief Complaint  Patient presents with  . Hypertension    HPI Wesley [name] is a 53 y.o. male with a history of alcohol use disorder, anxiety, hypercholesterolemia, and hypertension who presents to the emergency department with a chief complaint of "I woke up, and my head felt hot."  He reports that he was sleeping and awoke from sleep after having a bad dream with shortness of breath, chest pain, tingling in his bilateral hands, feeling hot all over, and a headache.  He reports the symptoms resolved within 5 to 10 minutes.  He is having no symptoms at this time.  He reports that he took a small piece of a lorazepam tablet from an old prescription for his symptoms with significant improvement.  He reports that he has not followed up with his behavioral health provider in 2 months and is out of the medication.  He ports that he checked his blood pressure earlier at home and it was 165/120.  He has been compliant with his home blood pressure medication.  He reports that he has a follow-up appoint with his PCP in 2 weeks.  He denies fever, chills, dizziness, visual changes, lightheadedness, numbness, weakness, slurred speech, palpitations, leg swelling, vomiting, or neck pain or stiffness.       The history is provided by the patient. No language interpreter was used.    Past Medical History:  Diagnosis Date  . Alcohol abuse   . Anxiety   . Anxiety   . Chronic neck pain   . Cocaine abuse (HCC)   . Dizziness   . Hypercholesteremia   . Hypertension   . Prediabetes     Patient Active Problem List   Diagnosis Date Noted  . Legally blind in right eye, as defined in [place] 05/02/2017  . Prediabetes 04/23/2017  . Muscle spasm 04/23/2017  . Essential hypertension 04/16/2017  . H/O medication noncompliance 04/02/2017  . Vitamin D  deficiency 04/02/2017  . DJD (degenerative joint disease) of cervical spine 03/16/2017  . 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 Dizziness 01/23/2017  . Anxiety 11/21/2016    Past Surgical History:  Procedure Laterality Date  . EYE SURGERY Right ~ 1980   "hit my eye"        Home Medications    Prior to Admission medications   Medication Sig Start Date End Date Taking? Authorizing Provider  acetaminophen (TYLENOL) 500 MG tablet Take 1,000 mg by mouth daily as needed (back pain).    [provider]  amLODipine (NORVASC) 10 MG tablet Take 1 tablet (10 mg total) by mouth daily. 10/17/18 01/15/19  Mardella Layman, MD  benzonatate (TESSALON) 100 MG capsule Take 1 capsule by mouth every 8 (eight) hours for cough. 10/17/18   Mardella Layman, MD  cyclobenzaprine (FLEXERIL) 5 MG tablet Take 1-2 tablets (5-10 mg total) by mouth 2 (two) times daily as needed for muscle spasms. 08/18/18   Lurene Shadow, PA-C  famotidine (PEPCID) 20 MG tablet Take 1 tablet (20 mg total) by mouth daily. 09/09/18   Cathie Hoops, Amy V, PA-C  hydroxypropyl methylcellulose / hypromellose (ISOPTO TEARS / GONIOVISC) 2.5 % ophthalmic solution Place 1 drop into both eyes 3 (three) times daily as needed for dry eyes.    [provider]  lisinopril (PRINIVIL,ZESTRIL) 5 MG tablet Take 1 tablet (5 mg total) by mouth daily. 11/05/18  Mardella Layman, MD  meclizine (ANTIVERT) 12.5 MG tablet Take 1 tablet (12.5 mg total) by mouth 3 (three) times daily as needed for dizziness. 11/05/18   Mardella Layman, MD  meloxicam (MOBIC) 7.5 MG tablet Take 1-2 tablets (7.5-15 mg total) by mouth daily. 08/18/18   Lurene Shadow, PA-C  nystatin cream (MYCOSTATIN) Apply 1 application topically 2 (two) times daily. 09/21/18   Wieters, Hallie C, PA-C  triamcinolone cream (KENALOG) 0.1 % Apply 1 application topically 2 (two) times daily. 10/17/18   Mardella Layman, MD    Family History Family History  Problem Relation Age of Onset  . Hypertension Mother   . Hypertension  Father     Social History Social History   Tobacco Use  . Smoking status: Former Games developer  . Smokeless tobacco: Never Used  Substance Use Topics  . Alcohol use: Not Currently    Comment: former  . Drug use: Not Currently    Comment: denies, reports past history      Allergies   Ibuprofen; Other; and Zofran [ondansetron hcl]   Review of Systems Review of Systems  Constitutional: Negative for appetite change, chills, diaphoresis and fever.  HENT: Negative for congestion, sinus pressure and sinus pain.   Respiratory: Positive for shortness of breath. Negative for cough and wheezing.   Cardiovascular: Positive for chest pain. Negative for palpitations and leg swelling.  Gastrointestinal: Negative for abdominal pain, blood in stool, diarrhea, nausea and vomiting.  Genitourinary: Negative for dysuria, frequency and hematuria.  Musculoskeletal: Negative for back pain, myalgias, neck pain and neck stiffness.  Skin: Negative for rash.  Allergic/Immunologic: Negative for immunocompromised state.  Neurological: Positive for headaches. Negative for dizziness, seizures, syncope, weakness and numbness.       Paresthesias  Psychiatric/Behavioral: Negative for confusion.     Physical Exam Updated Vital Signs BP 113/83   Pulse 93   Temp 98.1 F (36.7 C) (Oral)   Resp 18   SpO2 96%   Physical Exam Vitals signs and nursing note reviewed.  Constitutional:      General: He is not in acute distress.    Appearance: He is well-developed. He is not ill-appearing or diaphoretic.  HENT:     Head: Normocephalic.     Nose: Nose normal.  Eyes:     Extraocular Movements: Extraocular movements intact.     Conjunctiva/sclera: Conjunctivae normal.     Pupils: Pupils are equal, round, and reactive to light.  Neck:     Musculoskeletal: Neck supple. No neck rigidity or muscular tenderness.  Cardiovascular:     Rate and Rhythm: Normal rate and regular rhythm.     Heart sounds: No murmur. No  friction rub. No gallop.   Pulmonary:     Effort: Pulmonary effort is normal. No respiratory distress.     Breath sounds: Normal breath sounds. No stridor. No wheezing, rhonchi or rales.  Chest:     Chest wall: No tenderness.  Abdominal:     General: There is no distension.     Palpations: Abdomen is soft. There is no mass.     Tenderness: There is no abdominal tenderness. There is no right CVA tenderness, left CVA tenderness, guarding or rebound.     Hernia: No hernia is present.  Musculoskeletal:     Right lower leg: No edema.     Left lower leg: No edema.  Skin:    General: Skin is warm and dry.     Capillary Refill: Capillary refill takes less than 2  seconds.  Neurological:     Mental Status: He is alert.  Psychiatric:        Behavior: Behavior normal.      ED Treatments / Results  Labs (all labs ordered are listed, but only abnormal results are displayed) Labs Reviewed - No data to display  EKG None  Radiology No results found.  Procedures Procedures (including critical care time)  Medications Ordered in ED Medications - No data to display   Initial Impression / Assessment and Plan / ED Course  I have reviewed the triage vital signs and the nursing notes.  Pertinent labs & imaging results that were available during my care of the patient were reviewed by me and considered in my medical decision making (see chart for details).        53 year old male with a history of alcohol use disorder, anxiety, hypercholesterolemia, and hypertension who is well-known to this emergency department with 30 visits in the last 6 months.  He presents with an episode of chest pain, shortness of breath, paresthesias in the bilateral hands, headache, and "feeling hot in his head" that began after awaking from sleep after having a nightmare.  His symptoms resolved within 5 minutes.  He states that he took a portion of the lorazepam tablet from an old prescription prior to arrival.   He is out of the medication.  He reports that he has been on the medication for at least 3 years and has taken it daily until last week.  States this was his first dose in the last week.  He does not have any symptoms or signs of benzodiazepine withdrawal.  He is normotensive and without tachycardia.  No hypoxia.  He has no complaints at this time.  Chart reviewed.  I suspect the patient had a panic attack given his longstanding history of anxiety.  He has had numerous cardiac work-ups previously.  The patient was offered Vistaril, but declined.  We will provide him with a referral to [facility].  He was also given strict return precautions to the emergency department.  He is hemodynamically stable and in no acute distress.  Safe for discharge home with outpatient follow-up at this time.  Final Clinical Impressions(s) / ED Diagnoses   Final diagnoses:  Panic attack    ED Discharge Orders    None       Barkley Boards, PA-C 11/06/18 0536    Ward, Layla Maw, DO 11/06/18 [phone]

## 2018-11-06 NOTE — ED Triage Notes (Signed)
Pt reports high blood pressure, has been taking his medication as prescribed. Denies CP/SOB, HA, blurred vision.

## 2018-11-06 NOTE — ED Notes (Signed)
Patient verbalizes understanding of discharge instructions. Opportunity for questioning and answers were provided. Armband removed by staff, pt discharged from ED.  

## 2018-11-06 NOTE — Discharge Instructions (Signed)
Thank you for allowing me to care for you today in the Emergency Department.   Follow-up with your primary care provider or [facility] for your symptoms.  Continue to take your home blood pressure medication.  Return to the emergency department if you develop new or worsening symptoms including if you pass out, chest pain or shortness of breath that do not resolve, new numbness or weakness, persistent vomiting, or other new, concerning symptoms.

## 2018-11-06 NOTE — ED Provider Notes (Signed)
[facility]   [id number] 11/05/18 Arrival Time: 2000  ASSESSMENT & PLAN:  1. Episodic lightheadedness   2. Essential hypertension     Meds ordered this encounter  Medications  . lisinopril (PRINIVIL,ZESTRIL) 5 MG tablet    Sig: Take 1 tablet (5 mg total) by mouth daily.    Dispense:  30 tablet    Refill:  0  . meclizine (ANTIVERT) 12.5 MG tablet    Sig: Take 1 tablet (12.5 mg total) by mouth 3 (three) times daily as needed for dizziness.    Dispense:  30 tablet    Refill:  0   I again reassured Jacody that I do not suspect any serious or life-threatening problem at this time. Refilled medications as above. Use OTC Meclizine prn. Will proceed to the ED if he develops other symptoms such as alterations of speech, swallowing, vision, motor/sensory systems, or if dizziness worsens.  Reviewed expectations re: course of current medical issues. Questions answered. Outlined signs and symptoms indicating need for more acute intervention. Patient verbalized understanding. After Visit Summary given.   SUBJECTIVE:  Wesley [name] is a 53 y.o. male who presents for evaluation of chronic dizziness described as lightheadedness. Reports brief episode today after taking a nap. Few minutes of lightheadedness that has resolved. Otherwise well. Reports that he has stopped taking Ativan; has not been able to get a refill. But reports he has a f/u appt with his PCP in about one week. No CP/SOB. Ambulatory without difficulty. No extremity sensation changes or weakness.   ROS: As per HPI. All other systems negative.  OBJECTIVE:  Vitals:   11/05/18 2008  BP: (!) 132/96  Pulse: 91  Resp: 16  Temp: 97.9 F (36.6 C)  TempSrc: Oral  SpO2: 98%    General appearance: alert; no distress but appears anxious Eyes: PERRLA; EOMI; conjunctiva normal HENT: normocephalic; atraumatic Neck: supple with FROM Lungs: clear to auscultation bilaterally Heart: regular rate and rhythm Abdomen: soft,  non-tender; bowel sounds normal Extremities: no cyanosis or edema; symmetrical with no gross deformities Skin: warm and dry Neurologic: normal gait; DTR's normal and symmetric.; CN 2-12 grossly intact; rapid changes in position during the exam do not precipitate dizziness Psychological: alert and cooperative; normal mood and affect  Investigations:  Labs Reviewed  GLUCOSE, CAPILLARY - Abnormal; Notable for the following components:      Result Value   Glucose-Capillary 117 (*)    All other components within normal limits  CBG MONITORING, ED   No results found.  Allergies  Allergen Reactions  . Ibuprofen Other (See Comments) and Rash    Makes his throat get "very dry" after taking it States he cant take it  . Other Other (See Comments)    Steroids: Patient received a steroid shot from [facility] [phone] and it resulted in his muscles twitching all over his body  . Zofran [Ondansetron Hcl] Other (See Comments)    Causes sweating and body "feels badly"    Past Medical History:  Diagnosis Date  . Alcohol abuse   . Anxiety   . Anxiety   . Chronic neck pain   . Cocaine abuse (HCC)   . Dizziness   . Hypercholesteremia   . Hypertension   . Prediabetes    Social History   Socioeconomic History  . Marital status: Significant Other    Spouse name: Not on file  . Number of children: Not on file  . Years of education: Not on file  .  Highest education level: Not on file  Occupational History  . Not on file  Social Needs  . Financial resource strain: Not on file  . Food insecurity:    Worry: Not on file    Inability: Not on file  . Transportation needs:    Medical: Not on file    Non-medical: Not on file  Tobacco Use  . Smoking status: Former Games developer  . Smokeless tobacco: Never Used  Substance and Sexual Activity  . Alcohol use: Not Currently    Comment: former  . Drug use: Not Currently    Comment: denies, reports past history   . Sexual activity:  Not on file  Lifestyle  . Physical activity:    Days per week: Not on file    Minutes per session: Not on file  . Stress: Not on file  Relationships  . Social connections:    Talks on phone: Not on file    Gets together: Not on file    Attends religious service: Not on file    Active member of club or organization: Not on file    Attends meetings of clubs or organizations: Not on file    Relationship status: Not on file  . Intimate partner violence:    Fear of current or ex partner: Not on file    Emotionally abused: Not on file    Physically abused: Not on file    Forced sexual activity: Not on file  Other Topics Concern  . Not on file  Social History Narrative  . Not on file   Family History  Problem Relation Age of Onset  . Hypertension Mother   . Hypertension Father    Past Surgical History:  Procedure Laterality Date  . EYE SURGERY Right ~ 1980   "hit my eye"      Mardella Layman, MD 11/06/18 [phone]

## 2018-11-19 ENCOUNTER — Encounter: Payer: Self-pay | Admitting: Gastroenterology

## 2018-12-02 ENCOUNTER — Other Ambulatory Visit: Payer: Self-pay

## 2018-12-02 ENCOUNTER — Telehealth (INDEPENDENT_AMBULATORY_CARE_PROVIDER_SITE_OTHER): Payer: Self-pay | Admitting: Gastroenterology

## 2018-12-02 DIAGNOSIS — R740 Nonspecific elevation of levels of transaminase and lactic acid dehydrogenase [LDH]: Secondary | ICD-10-CM

## 2018-12-02 DIAGNOSIS — R42 Dizziness and giddiness: Secondary | ICD-10-CM

## 2018-12-02 DIAGNOSIS — R1013 Epigastric pain: Secondary | ICD-10-CM

## 2018-12-02 DIAGNOSIS — R110 Nausea: Secondary | ICD-10-CM

## 2018-12-02 DIAGNOSIS — Z79899 Other long term (current) drug therapy: Secondary | ICD-10-CM

## 2018-12-02 DIAGNOSIS — Z1211 Encounter for screening for malignant neoplasm of colon: Secondary | ICD-10-CM

## 2018-12-02 DIAGNOSIS — R748 Abnormal levels of other serum enzymes: Secondary | ICD-10-CM

## 2018-12-02 MED ORDER — OMEPRAZOLE 40 MG PO CPDR: 40.0000 mg | DELAYED_RELEASE_CAPSULE | Freq: Every day | ORAL | 1 refills | Status: DC

## 2018-12-02 MED ORDER — PROMETHAZINE HCL 12.5 MG PO TABS: 12.5000 mg | ORAL_TABLET | Freq: Four times a day (QID) | ORAL | 0 refills | Status: DC | PRN

## 2018-12-02 NOTE — Progress Notes (Signed)
Virtual Visit via Telephone Note  I connected with Delmar Faust on 12/02/18 at  9:00 AM EDT by telephone and verified that I am speaking with the correct person using two identifiers.   I discussed the limitations, risks, security and privacy concerns of performing an evaluation and management service by telephone and the availability of in person appointments. I also discussed with the patient that there may be a patient responsible charge related to this service. The patient expressed understanding and agreed to proceed.  THIS ENCOUNTER IS A VIRTUAL VISIT DUE TO COVID-19 - PATIENT WAS NOT SEEN IN THE OFFICE. PATIENT HAS CONSENTED TO VIRTUAL VISIT / TELEMEDICINE VISIT   Location of patient: home Location of provider: office Name of referring provider: Claiborne Rigg, NP Time spent on call: 29 minutes  HPI :  53 year old male with a history of reported alcohol use, hypertension, dizziness, referred by Bertram Denver for chronic nausea and for colon cancer screening.  History obtained through the use of translational services.  The patient reports daily nausea ongoing for the past 2 years at least. He states he wakens with severe nausea in the morning with bothersome intermittently. He denies any clear trigger to his nausea. He apparently is able to eat okay and denies much vomiting. He does have some epigastric pain and burning after he eats which has been bothering him for the past 2 years as well. He denies any weight loss, in fact has been gaining some weight. Denies any significant reflux symptoms. No family history of gastric or colon cancer. He denies any NSAID use. He states he only takes lisinopril, lorazepam, and meclizine for his dizziness. He denies any tobacco or marijuana use, he denies any current alcohol use although with reported his chart that he sees this in the past frequently. He has a history of fatty liver noted on ultrasound in March 2018 without any gallstones noted. He  was previously prescribed Pepcid for these complaints which she has not taken. It appears he had a positive H. Pylori breath test in May 2018 and was given a course of antibiotics to be treated for that. He denies any recollection of that. He states he has an intolerance to Zofran in the past, unclear what the reaction was. He is adamant he is not drinking any alcohol at this time. He has not tried any other antiemetics.  He denies any prior liver disease. Last LFTs showed a mild AST and ALT elevation. No FH of liver disease. He has drank alcohol in the past, denies recent usage.  He is otherwise never had a prior colonoscopy. He denies any bowel problems at bother him or any blood in his stools. No family history of colon cancer.     Past Medical History:  Diagnosis Date   Alcohol abuse    Anxiety    Anxiety    Chronic neck pain    Cocaine abuse (HCC)    Dizziness    Hypercholesteremia    Hypertension    Prediabetes      Past Surgical History:  Procedure Laterality Date   EYE SURGERY Right ~ 1980   "hit my eye"   Family History  Problem Relation Age of Onset   Hypertension Mother    Hypertension Father    Social History   Tobacco Use   Smoking status: Former Smoker   Smokeless tobacco: Never Used  Substance Use Topics   Alcohol use: Not Currently    Comment: former   Drug  use: Not Currently    Comment: denies, reports past history    Current Outpatient Medications  Medication Sig Dispense Refill   acetaminophen (TYLENOL) 500 MG tablet Take 1,000 mg by mouth daily as needed (back pain).     amLODipine (NORVASC) 10 MG tablet Take 1 tablet (10 mg total) by mouth daily. 90 tablet 0   benzonatate (TESSALON) 100 MG capsule Take 1 capsule by mouth every 8 (eight) hours for cough. 21 capsule 0   cyclobenzaprine (FLEXERIL) 5 MG tablet Take 1-2 tablets (5-10 mg total) by mouth 2 (two) times daily as needed for muscle spasms. 30 tablet 0   hydroxypropyl  methylcellulose / hypromellose (ISOPTO TEARS / GONIOVISC) 2.5 % ophthalmic solution Place 1 drop into both eyes 3 (three) times daily as needed for dry eyes.     lisinopril (PRINIVIL,ZESTRIL) 5 MG tablet Take 1 tablet (5 mg total) by mouth daily. 30 tablet 0   meclizine (ANTIVERT) 12.5 MG tablet Take 1 tablet (12.5 mg total) by mouth 3 (three) times daily as needed for dizziness. 30 tablet 0   nystatin cream (MYCOSTATIN) Apply 1 application topically 2 (two) times daily. 30 g 0   omeprazole (PRILOSEC) 40 MG capsule Take 1 capsule (40 mg total) by mouth daily. 90 capsule 1   promethazine (PHENERGAN) 12.5 MG tablet Take 1 tablet (12.5 mg total) by mouth every 6 (six) hours as needed for nausea (caution - this can cause sedation). 30 tablet 0   triamcinolone cream (KENALOG) 0.1 % Apply 1 application topically 2 (two) times daily. 30 g 0   No current facility-administered medications for this visit.    Allergies  Allergen Reactions   Ibuprofen Other (See Comments) and Rash    Makes his throat get "very dry" after taking it States he cant take it   Other Other (See Comments)    Steroids: Patient received a steroid shot from [facility] [id number] and it resulted in his muscles twitching all over his body   Zofran [Ondansetron Hcl] Other (See Comments)    Causes sweating and body "feels badly"     Review of Systems: All systems reviewed and negative except where noted in HPI.   Lab Results  Component Value Date   ALT 47 (H) 08/02/2018   AST 48 (H) 08/02/2018   ALKPHOS 83 08/02/2018   BILITOT 0.9 08/02/2018    Lab Results  Component Value Date   CREATININE 0.82 08/02/2018   BUN 9 08/02/2018   NA 134 (L) 08/02/2018   K 4.7 08/02/2018   CL 103 08/02/2018   CO2 21 (L) 08/02/2018    Lab Results  Component Value Date   WBC 5.4 08/02/2018   HGB 13.7 08/02/2018   HCT 46.7 08/02/2018   MCV 92.7 08/02/2018   PLT 148 (L) 08/02/2018      Physical  Exam: There were no vitals taken for this visit.  No exam  ASSESSMENT AND PLAN: 53 year old male here for new patient evaluation regarding the following symptoms:  Chronic nausea / epigastric pain - 2 years worth of daily chronic nausea, along with epigastric discomfort at times in relation to meals. Prior US shows no gallstones. He has been H pylori positive in the past but unclear if he ever took the therapy. No alarm symptoms. He also has ongoing dizziness, which sounds like vertigo, not well controlled with meclizine. Possible that causing the nausea. Recommend he follow up with his primary care regarding that issue. Otherwise we'll give  him a trial of Phenergan to use as needed for nausea, and I will also give him a trial of omeprazole 40 mg once daily to treat his dyspepsia, unclear if he has H. Pylori that persists or an ulcer. In light of his ongoing symptoms and recommending an upper endoscopy to further evaluate. I discussed risks and benefits and he wanted to proceed. The timing of this will be determined in light of the recent COVID-19 outbreak, elective procedures are being delayed at this time. He will be contacted for scheduling purposes in the future based on course with this. Hopefully this regimen will help in the interim.  Transaminitis - mild, likely fatty liver, unclear how much alcohol he has drank in the past based on history today. Recommend interval repeating of LFTs to see if this persists and will screen for hep B / C as well. If elevation persists also consider further serologic workup.  Colon cancer screening - offered screening colonoscopy to be done at same time as EGD, timing of scheduling as above.   Ileene Patrick, MD [facility]  CC: Claiborne Rigg, NP

## 2018-12-07 ENCOUNTER — Other Ambulatory Visit: Payer: Self-pay

## 2018-12-07 ENCOUNTER — Emergency Department (HOSPITAL_COMMUNITY)
Admission: EM | Admit: 2018-12-07 | Discharge: 2018-12-07 | Disposition: A | Discharge status: Home / Self Care | Payer: Self-pay | Attending: Emergency Medicine | Admitting: Emergency Medicine

## 2018-12-07 ENCOUNTER — Encounter (HOSPITAL_COMMUNITY): Payer: Self-pay | Admitting: Emergency Medicine

## 2018-12-07 DIAGNOSIS — I159 Secondary hypertension, unspecified: Secondary | ICD-10-CM

## 2018-12-07 DIAGNOSIS — Z79899 Other long term (current) drug therapy: Secondary | ICD-10-CM | POA: Insufficient documentation

## 2018-12-07 DIAGNOSIS — I10 Essential (primary) hypertension: Secondary | ICD-10-CM | POA: Insufficient documentation

## 2018-12-07 DIAGNOSIS — Z87891 Personal history of nicotine dependence: Secondary | ICD-10-CM | POA: Insufficient documentation

## 2018-12-07 NOTE — ED Provider Notes (Signed)
[facility] EMERGENCY DEPARTMENT Provider Note   CSN: [id number] Arrival date & time: 12/07/18  0608    History   Chief Complaint Chief Complaint  Patient presents with  . Hypertension    HPI Wesley [name] is a 53 y.o. male.     The history is provided by the patient.  Hypertension  This is a chronic problem. The current episode started more than 1 week ago. The problem occurs constantly. The problem has not changed since onset.Pertinent negatives include no chest pain, no abdominal pain, no headaches and no shortness of breath. Nothing aggravates the symptoms. Nothing relieves the symptoms. He has tried nothing for the symptoms. The treatment provided no relief.  Didn't take his BP meds yesterday and then BP was 160 SBP and thought he should come in.  Is asymptomatic and took this AMs dose just prior to arrival.  No CP, no SOB, no weakness, no numbness no changes in vision or speech.    Past Medical History:  Diagnosis Date  . Alcohol abuse   . Anxiety   . Anxiety   . Chronic neck pain   . Cocaine abuse (HCC)   . Dizziness   . Hypercholesteremia   . Hypertension   . Prediabetes     Patient Active Problem List   Diagnosis Date Noted  . Legally blind in right eye, as defined in [place] 05/02/2017  . Prediabetes 04/23/2017  . Muscle spasm 04/23/2017  . Essential hypertension 04/16/2017  . H/O medication noncompliance 04/02/2017  . Vitamin D deficiency 04/02/2017  . DJD (degenerative joint disease) of cervical spine 03/16/2017  . 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 Dizziness 01/23/2017  . Anxiety 11/21/2016    Past Surgical History:  Procedure Laterality Date  . EYE SURGERY Right ~ 1980   "hit my eye"        Home Medications    Prior to Admission medications   Medication Sig Start Date End Date Taking? Authorizing Provider  acetaminophen (TYLENOL) 500 MG tablet Take 1,000 mg by mouth daily as needed (back pain).    [provider]  amLODipine  (NORVASC) 10 MG tablet Take 1 tablet (10 mg total) by mouth daily. 10/17/18 01/15/19  Mardella Layman, MD  benzonatate (TESSALON) 100 MG capsule Take 1 capsule by mouth every 8 (eight) hours for cough. 10/17/18   Mardella Layman, MD  cyclobenzaprine (FLEXERIL) 5 MG tablet Take 1-2 tablets (5-10 mg total) by mouth 2 (two) times daily as needed for muscle spasms. 08/18/18   Lurene Shadow, PA-C  hydroxypropyl methylcellulose / hypromellose (ISOPTO TEARS / GONIOVISC) 2.5 % ophthalmic solution Place 1 drop into both eyes 3 (three) times daily as needed for dry eyes.    [provider]  lisinopril (PRINIVIL,ZESTRIL) 5 MG tablet Take 1 tablet (5 mg total) by mouth daily. 11/05/18   Mardella Layman, MD  meclizine (ANTIVERT) 12.5 MG tablet Take 1 tablet (12.5 mg total) by mouth 3 (three) times daily as needed for dizziness. 11/05/18   Mardella Layman, MD  nystatin cream (MYCOSTATIN) Apply 1 application topically 2 (two) times daily. 09/21/18   Wieters, Hallie C, PA-C  omeprazole (PRILOSEC) 40 MG capsule Take 1 capsule (40 mg total) by mouth daily. 12/02/18   Armbruster, Willaim Rayas, MD  promethazine (PHENERGAN) 12.5 MG tablet Take 1 tablet (12.5 mg total) by mouth every 6 (six) hours as needed for nausea (caution - this can cause sedation). 12/02/18   Armbruster, Willaim Rayas, MD  triamcinolone cream (KENALOG) 0.1 %  Apply 1 application topically 2 (two) times daily. 10/17/18   Mardella LaymanHagler, Brian, MD    Family History Family History  Problem Relation Age of Onset  . Hypertension Mother   . Hypertension Father     Social History Social History   Tobacco Use  . Smoking status: Former Games developermoker  . Smokeless tobacco: Never Used  Substance Use Topics  . Alcohol use: Not Currently    Comment: former  . Drug use: Not Currently    Comment: denies, reports past history      Allergies   Ibuprofen; Other; and Zofran [ondansetron hcl]   Review of Systems Review of Systems  Constitutional: Negative for fever.  Eyes: Negative  for visual disturbance.  Respiratory: Negative for shortness of breath.   Cardiovascular: Negative for chest pain.  Gastrointestinal: Negative for abdominal pain.  Neurological: Negative for headaches.  All other systems reviewed and are negative.    Physical Exam Updated Vital Signs BP (!) 160/101   Pulse (!) 106   Wt 93 kg   SpO2 97%   BMI 33.09 kg/m   Physical Exam Vitals signs and nursing note reviewed.  Constitutional:      General: He is not in acute distress.    Appearance: He is obese.  HENT:     Head: Normocephalic and atraumatic.     Nose: Nose normal.  Eyes:     Conjunctiva/sclera: Conjunctivae normal.     Pupils: Pupils are equal, round, and reactive to light.  Neck:     Musculoskeletal: Normal range of motion and neck supple.  Cardiovascular:     Rate and Rhythm: Normal rate and regular rhythm.     Pulses: Normal pulses.     Heart sounds: Normal heart sounds.  Pulmonary:     Effort: Pulmonary effort is normal.     Breath sounds: Normal breath sounds.  Abdominal:     General: Abdomen is flat. Bowel sounds are normal.     Tenderness: There is no abdominal tenderness. There is no guarding or rebound.  Musculoskeletal: Normal range of motion.  Skin:    General: Skin is warm and dry.     Capillary Refill: Capillary refill takes less than 2 seconds.  Neurological:     General: No focal deficit present.     Mental Status: He is alert and oriented to person, place, and time.     Deep Tendon Reflexes: Reflexes normal.  Psychiatric:        Mood and Affect: Mood normal.        Behavior: Behavior normal.      ED Treatments / Results  Labs (all labs ordered are listed, but only abnormal results are displayed) Labs Reviewed - No data to display  EKG None  Radiology No results found.  Procedures Procedures (including critical care time)  Medications Ordered in ED Medications - No data to display   Final Clinical Impressions(s) / ED Diagnoses    Final diagnoses:  Secondary hypertension    Return for intractable cough, coughing up blood,fevers >100.4 unrelieved by medication, shortness of breath, intractable vomiting, chest pain, shortness of breath, weakness,numbness, changes in speech, facial asymmetry,abdominal pain, passing out,Inability to tolerate liquids or food, cough, altered mental status or any concerns. No signs of systemic illness or infection. The patient is nontoxic-appearing on exam and vital signs are within normal limits.   I have reviewed the triage vital signs and the nursing notes. Pertinent labs &imaging results that were available during my care of  the patient were reviewed by me and considered in my medical decision making (see chart for details).  After history, exam, and medical workup I feel the patient has been appropriately medically screened and is safe for discharge home. Pertinent diagnoses were discussed with the patient. Patient was given return precautions.   , , MD 12/07/18 0630

## 2018-12-07 NOTE — ED Triage Notes (Signed)
Pt states he missed his bp meds yesterday, woke at 0500 and took his bp - reading 160/120, felt he should come in.

## 2018-12-07 NOTE — ED Notes (Signed)
Patient verbalizes understanding of discharge instructions. Opportunity for questioning and answers were provided. Armband removed by staff, pt discharged from ED. Ambulated out to lobby  

## 2018-12-15 ENCOUNTER — Other Ambulatory Visit: Payer: Self-pay

## 2018-12-15 ENCOUNTER — Encounter (HOSPITAL_COMMUNITY): Payer: Self-pay | Admitting: Emergency Medicine

## 2018-12-15 ENCOUNTER — Ambulatory Visit (HOSPITAL_COMMUNITY)
Admission: EM | Admit: 2018-12-15 | Discharge: 2018-12-15 | Disposition: A | Discharge status: Home / Self Care | Payer: Self-pay | Attending: Family Medicine | Admitting: Family Medicine

## 2018-12-15 DIAGNOSIS — J302 Other seasonal allergic rhinitis: Secondary | ICD-10-CM

## 2018-12-15 DIAGNOSIS — I10 Essential (primary) hypertension: Secondary | ICD-10-CM

## 2018-12-15 MED ORDER — PREDNISONE 20 MG PO TABS: ORAL_TABLET | ORAL | 0 refills | Status: DC

## 2018-12-15 MED ORDER — LISINOPRIL 5 MG PO TABS: 5.0000 mg | ORAL_TABLET | Freq: Every day | ORAL | 3 refills | Status: DC

## 2018-12-15 NOTE — ED Provider Notes (Signed)
[facility]    CSN: [id number] Arrival date & time: 12/15/18  1126     History   Chief Complaint Chief Complaint  Patient presents with  . Nasal Congestion    HPI Lejend Dicecco is a 53 y.o. male.   This is a 53 year old man who frequents this facility often.  He presents with nasal congestion which began yesterday.  He is also had some myalgia but no cough or fever.  He notes itchy eyes.  He is also requesting a refill on his lisinopril.     Past Medical History:  Diagnosis Date  . Alcohol abuse   . Anxiety   . Anxiety   . Chronic neck pain   . Cocaine abuse (HCC)   . Dizziness   . Hypercholesteremia   . Hypertension   . Prediabetes     Patient Active Problem List   Diagnosis Date Noted  . Legally blind in right eye, as defined in [place] 05/02/2017  . Prediabetes 04/23/2017  . Muscle spasm 04/23/2017  . Essential hypertension 04/16/2017  . H/O medication noncompliance 04/02/2017  . Vitamin D deficiency 04/02/2017  . DJD (degenerative joint disease) of cervical spine 03/16/2017  . 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 Dizziness 01/23/2017  . Anxiety 11/21/2016    Past Surgical History:  Procedure Laterality Date  . EYE SURGERY Right ~ 1980   "hit my eye"       Home Medications    Prior to Admission medications   Medication Sig Start Date End Date Taking? Authorizing Provider  acetaminophen (TYLENOL) 500 MG tablet Take 1,000 mg by mouth daily as needed (back pain).    [provider]  amLODipine (NORVASC) 10 MG tablet Take 1 tablet (10 mg total) by mouth daily. 10/17/18 01/15/19  Mardella Layman, MD  benzonatate (TESSALON) 100 MG capsule Take 1 capsule by mouth every 8 (eight) hours for cough. 10/17/18   Mardella Layman, MD  cyclobenzaprine (FLEXERIL) 5 MG tablet Take 1-2 tablets (5-10 mg total) by mouth 2 (two) times daily as needed for muscle spasms. 08/18/18   Lurene Shadow, PA-C  hydroxypropyl methylcellulose / hypromellose (ISOPTO TEARS / GONIOVISC)  2.5 % ophthalmic solution Place 1 drop into both eyes 3 (three) times daily as needed for dry eyes.    [provider]  lisinopril (PRINIVIL,ZESTRIL) 5 MG tablet Take 1 tablet (5 mg total) by mouth daily. 12/15/18   Elvina Sidle, MD  meclizine (ANTIVERT) 12.5 MG tablet Take 1 tablet (12.5 mg total) by mouth 3 (three) times daily as needed for dizziness. 11/05/18   Mardella Layman, MD  nystatin cream (MYCOSTATIN) Apply 1 application topically 2 (two) times daily. 09/21/18   Wieters, Hallie C, PA-C  omeprazole (PRILOSEC) 40 MG capsule Take 1 capsule (40 mg total) by mouth daily. 12/02/18   Armbruster, Willaim Rayas, MD  predniSONE (DELTASONE) 20 MG tablet One daily with food 12/15/18   Elvina Sidle, MD  promethazine (PHENERGAN) 12.5 MG tablet Take 1 tablet (12.5 mg total) by mouth every 6 (six) hours as needed for nausea (caution - this can cause sedation). 12/02/18   Armbruster, Willaim Rayas, MD  triamcinolone cream (KENALOG) 0.1 % Apply 1 application topically 2 (two) times daily. 10/17/18   Mardella Layman, MD    Family History Family History  Problem Relation Age of Onset  . Hypertension Mother   . Hypertension Father     Social History Social History   Tobacco Use  . Smoking status: Former Games developer  . Smokeless tobacco:  Never Used  Substance Use Topics  . Alcohol use: Not Currently    Comment: former  . Drug use: Not Currently    Comment: denies, reports past history      Allergies   Ibuprofen; Other; and Zofran [ondansetron hcl]   Review of Systems Review of Systems  HENT: Positive for postnasal drip.   Eyes: Positive for itching. Negative for discharge.  Musculoskeletal: Positive for myalgias.  All other systems reviewed and are negative.    Physical Exam Triage Vital Signs ED Triage Vitals [12/15/18 1158]  Enc Vitals Group     BP (!) 149/92     Pulse Rate 100     Resp 18     Temp 98.9 F (37.2 C)     Temp Source Oral     SpO2 97 %     Weight      Height       Head Circumference      Peak Flow      Pain Score 4     Pain Loc      Pain Edu?      Excl. in GC?    No data found.  Updated Vital Signs BP (!) 149/92 (BP Location: Left Arm)   Pulse 100   Temp 98.9 F (37.2 C) (Oral)   Resp 18   SpO2 97%    Physical Exam Vitals signs and nursing note reviewed.  Constitutional:      Appearance: Normal appearance. He is obese.  HENT:     Head: Normocephalic.     Nose: Congestion present.     Mouth/Throat:     Mouth: Mucous membranes are moist.     Pharynx: No oropharyngeal exudate or posterior oropharyngeal erythema.  Eyes:     Comments: Mildly injected conjunctiva with wateriness  Neck:     Musculoskeletal: Normal range of motion and neck supple.  Cardiovascular:     Rate and Rhythm: Normal rate and regular rhythm.     Pulses: Normal pulses.     Heart sounds: Normal heart sounds.  Pulmonary:     Effort: Pulmonary effort is normal.     Breath sounds: Normal breath sounds.  Musculoskeletal: Normal range of motion.  Skin:    General: Skin is warm and dry.  Neurological:     General: No focal deficit present.     Mental Status: He is alert.  Psychiatric:        Mood and Affect: Mood normal.      UC Treatments / Results  Labs (all labs ordered are listed, but only abnormal results are displayed) Labs Reviewed - No data to display  EKG None  Radiology No results found.  Procedures Procedures (including critical care time)  Medications Ordered in UC Medications - No data to display  Initial Impression / Assessment and Plan / UC Course  I have reviewed the triage vital signs and the nursing notes.  Pertinent labs & imaging results that were available during my care of the patient were reviewed by me and considered in my medical decision making (see chart for details).    Final Clinical Impressions(s) / UC Diagnoses   Final diagnoses:  Seasonal allergies  Essential hypertension   Discharge Instructions   None     ED Prescriptions    Medication Sig Dispense Auth. Provider   lisinopril (PRINIVIL,ZESTRIL) 5 MG tablet Take 1 tablet (5 mg total) by mouth daily. 90 tablet Elvina Sidle, MD   predniSONE (DELTASONE) 20 MG tablet  One daily with food 5 tablet Elvina Sidle, MD     Controlled Substance Prescriptions [place] Controlled Substance Registry consulted? Not Applicable   Elvina Sidle, MD 12/15/18 1222

## 2018-12-15 NOTE — ED Triage Notes (Signed)
Pt sts nasal congestion and itchy eyes; pt sts some body aches last night

## 2018-12-19 ENCOUNTER — Emergency Department (HOSPITAL_COMMUNITY)
Admission: EM | Admit: 2018-12-19 | Discharge: 2018-12-19 | Disposition: A | Discharge status: Home / Self Care | Payer: Self-pay | Attending: Emergency Medicine | Admitting: Emergency Medicine

## 2018-12-19 ENCOUNTER — Ambulatory Visit (HOSPITAL_COMMUNITY)
Admission: EM | Admit: 2018-12-19 | Discharge: 2018-12-19 | Disposition: A | Discharge status: Home / Self Care | Payer: Self-pay | Attending: Urgent Care | Admitting: Urgent Care

## 2018-12-19 ENCOUNTER — Other Ambulatory Visit: Payer: Self-pay

## 2018-12-19 ENCOUNTER — Encounter (HOSPITAL_COMMUNITY): Payer: Self-pay | Admitting: *Deleted

## 2018-12-19 ENCOUNTER — Encounter (HOSPITAL_COMMUNITY): Payer: Self-pay | Admitting: Emergency Medicine

## 2018-12-19 DIAGNOSIS — R1013 Epigastric pain: Secondary | ICD-10-CM

## 2018-12-19 DIAGNOSIS — R3129 Other microscopic hematuria: Secondary | ICD-10-CM

## 2018-12-19 DIAGNOSIS — I10 Essential (primary) hypertension: Secondary | ICD-10-CM | POA: Insufficient documentation

## 2018-12-19 DIAGNOSIS — Z79899 Other long term (current) drug therapy: Secondary | ICD-10-CM | POA: Insufficient documentation

## 2018-12-19 DIAGNOSIS — R109 Unspecified abdominal pain: Secondary | ICD-10-CM

## 2018-12-19 DIAGNOSIS — R7303 Prediabetes: Secondary | ICD-10-CM | POA: Insufficient documentation

## 2018-12-19 DIAGNOSIS — R110 Nausea: Secondary | ICD-10-CM | POA: Insufficient documentation

## 2018-12-19 DIAGNOSIS — Z87891 Personal history of nicotine dependence: Secondary | ICD-10-CM | POA: Insufficient documentation

## 2018-12-19 DIAGNOSIS — K219 Gastro-esophageal reflux disease without esophagitis: Secondary | ICD-10-CM | POA: Insufficient documentation

## 2018-12-19 LAB — CBC
HCT: 42.6 % (ref 39.0–52.0)
Hemoglobin: 13.8 g/dL (ref 13.0–17.0)
MCH: 27.1 pg (ref 26.0–34.0)
MCHC: 32.4 g/dL (ref 30.0–36.0)
MCV: 83.7 fL (ref 80.0–100.0)
Platelets: 205 10*3/uL (ref 150–400)
RBC: 5.09 MIL/uL (ref 4.22–5.81)
RDW: 13.5 % (ref 11.5–15.5)
WBC: 3.4 10*3/uL — ABNORMAL LOW (ref 4.0–10.5)
nRBC: 0 % (ref 0.0–0.2)

## 2018-12-19 LAB — COMPREHENSIVE METABOLIC PANEL
ALT: 48 U/L — ABNORMAL HIGH (ref 0–44)
AST: 38 U/L (ref 15–41)
Albumin: 4.1 g/dL (ref 3.5–5.0)
Alkaline Phosphatase: 76 U/L (ref 38–126)
Anion gap: 9 (ref 5–15)
BUN: 11 mg/dL (ref 6–20)
CO2: 22 mmol/L (ref 22–32)
Calcium: 8.5 mg/dL — ABNORMAL LOW (ref 8.9–10.3)
Chloride: 100 mmol/L (ref 98–111)
Creatinine, Ser: 0.72 mg/dL (ref 0.61–1.24)
GFR calc Af Amer: >60 mL/min (ref >60)
GFR calc non Af Amer: >60 mL/min (ref >60)
Glucose, Bld: 130 mg/dL — ABNORMAL HIGH (ref 70–99)
Potassium: 3.8 mmol/L (ref 3.5–5.1)
Sodium: 131 mmol/L — ABNORMAL LOW (ref 135–145)
Total Bilirubin: 0.3 mg/dL (ref 0.3–1.2)
Total Protein: 8.2 g/dL — ABNORMAL HIGH (ref 6.5–8.1)

## 2018-12-19 LAB — POCT URINALYSIS DIP (DEVICE)
Bilirubin Urine: NEGATIVE
Glucose, UA: NEGATIVE mg/dL
Ketones, ur: NEGATIVE mg/dL
Leukocytes,Ua: NEGATIVE
Nitrite: NEGATIVE
Protein, ur: 30 mg/dL — AB
Specific Gravity, Urine: 1.02 (ref 1.005–1.030)
Urobilinogen, UA: 0.2 mg/dL (ref 0.0–1.0)
pH: 6.5 (ref 5.0–8.0)

## 2018-12-19 LAB — LIPASE, BLOOD: Lipase: 30 U/L (ref 11–51)

## 2018-12-19 MED ORDER — TAMSULOSIN HCL 0.4 MG PO CAPS: 0.4000 mg | ORAL_CAPSULE | Freq: Every day | ORAL | 1 refills | Status: DC

## 2018-12-19 MED ORDER — ACETAMINOPHEN 500 MG PO TABS: 500.0000 mg | ORAL_TABLET | Freq: Four times a day (QID) | ORAL | 0 refills | Status: DC | PRN

## 2018-12-19 MED ORDER — LIDOCAINE VISCOUS HCL 2 % MT SOLN
15.0000 mL | Freq: Once | OROMUCOSAL | Status: AC
  Administered 2018-12-19: 15 mL via ORAL
  Filled 2018-12-19: qty 15

## 2018-12-19 MED ORDER — PROMETHAZINE HCL 12.5 MG PO TABS: 12.5000 mg | ORAL_TABLET | Freq: Four times a day (QID) | ORAL | 0 refills | Status: DC | PRN

## 2018-12-19 MED ORDER — OMEPRAZOLE 40 MG PO CPDR: 40.0000 mg | DELAYED_RELEASE_CAPSULE | Freq: Every day | ORAL | 1 refills | Status: DC

## 2018-12-19 MED ORDER — ALUM & MAG HYDROXIDE-SIMETH 200-200-20 MG/5ML PO SUSP
30.0000 mL | Freq: Once | ORAL | Status: AC
  Administered 2018-12-19: 30 mL via ORAL
  Filled 2018-12-19: qty 30

## 2018-12-19 NOTE — ED Provider Notes (Signed)
[facility]-EMERGENCY DEPT Provider Note   CSN: [id number] Arrival date & time: 12/19/18  0430    History   Chief Complaint Chief Complaint  Patient presents with  . Abdominal Pain    HPI Wesley [name] is a 53 y.o. male with a history of alcohol use disorder, cocaine use disorder, chronic nausea, HTN, H. pylori, GERD, and anxiety who presents to the emergency department with a chief complaint of epigastric pain.  The patient endorses intermittent, worsening epigastric pain that radiates around his bilateral upper abdomen that began 1 week ago.  He characterizes the pain as burning.  He reports pain is worse after eating spicy food.  No other known aggravating or alleviating factors.  He endorses nausea, but reports that this is chronic.  He has had no vomiting, fever, chills, cough, shortness of breath, urinary symptoms, constipation, diarrhea, melena, or hematochezia.   The patient is established with gastroenterology and was seen via telemedicine visit on 12/02/18.  He reports that he was supposed to have received a couple of prescriptions after the visit, "but they were never called in."  The patient did have a positive H. pylori breath test in the past, but it is unclear if he ever took the therapy.      The history is provided by the patient. No language interpreter was used.    Past Medical History:  Diagnosis Date  . Alcohol abuse   . Anxiety   . Anxiety   . Chronic neck pain   . Cocaine abuse (HCC)   . Dizziness   . Hypercholesteremia   . Hypertension   . Prediabetes     Patient Active Problem List   Diagnosis Date Noted  . Legally blind in right eye, as defined in [place] 05/02/2017  . Prediabetes 04/23/2017  . Muscle spasm 04/23/2017  . Essential hypertension 04/16/2017  . H/O medication noncompliance 04/02/2017  . Vitamin D deficiency 04/02/2017  . DJD (degenerative joint disease) of cervical spine 03/16/2017  . 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  Dizziness 01/23/2017  . Anxiety 11/21/2016    Past Surgical History:  Procedure Laterality Date  . EYE SURGERY Right ~ 1980   "hit my eye"        Home Medications    Prior to Admission medications   Medication Sig Start Date End Date Taking? Authorizing Provider  acetaminophen (TYLENOL) 500 MG tablet Take 1,000 mg by mouth daily as needed (back pain).    [provider]  amLODipine (NORVASC) 10 MG tablet Take 1 tablet (10 mg total) by mouth daily. 10/17/18 01/15/19  Mardella Layman, MD  benzonatate (TESSALON) 100 MG capsule Take 1 capsule by mouth every 8 (eight) hours for cough. 10/17/18   Mardella Layman, MD  cyclobenzaprine (FLEXERIL) 5 MG tablet Take 1-2 tablets (5-10 mg total) by mouth 2 (two) times daily as needed for muscle spasms. 08/18/18   Lurene Shadow, PA-C  hydroxypropyl methylcellulose / hypromellose (ISOPTO TEARS / GONIOVISC) 2.5 % ophthalmic solution Place 1 drop into both eyes 3 (three) times daily as needed for dry eyes.    [provider]  lisinopril (PRINIVIL,ZESTRIL) 5 MG tablet Take 1 tablet (5 mg total) by mouth daily. 12/15/18   Elvina Sidle, MD  meclizine (ANTIVERT) 12.5 MG tablet Take 1 tablet (12.5 mg total) by mouth 3 (three) times daily as needed for dizziness. 11/05/18   Mardella Layman, MD  nystatin cream (MYCOSTATIN) Apply 1 application topically 2 (two) times daily. 09/21/18   Sharyon Cable, [employer]  C, PA-C  omeprazole (PRILOSEC) 40 MG capsule Take 1 capsule (40 mg total) by mouth daily. 12/19/18   McDonald, Mia A, PA-C  predniSONE (DELTASONE) 20 MG tablet One daily with food 12/15/18   Elvina Sidle, MD  promethazine (PHENERGAN) 12.5 MG tablet Take 1 tablet (12.5 mg total) by mouth every 6 (six) hours as needed for nausea (caution - this can cause sedation). 12/19/18   McDonald, Mia A, PA-C  triamcinolone cream (KENALOG) 0.1 % Apply 1 application topically 2 (two) times daily. 10/17/18   Mardella Layman, MD    Family History Family History  Problem Relation  Age of Onset  . Hypertension Mother   . Hypertension Father     Social History Social History   Tobacco Use  . Smoking status: Former Games developer  . Smokeless tobacco: Never Used  Substance Use Topics  . Alcohol use: Not Currently    Comment: former  . Drug use: Not Currently    Comment: denies, reports past history      Allergies   Ibuprofen; Other; and Zofran [ondansetron hcl]   Review of Systems Review of Systems  Constitutional: Negative for appetite change, chills and fever.  HENT: Negative for congestion and sore throat.   Respiratory: Negative for cough, shortness of breath and wheezing.   Cardiovascular: Negative for chest pain, palpitations and leg swelling.  Gastrointestinal: Positive for abdominal pain and nausea. Negative for constipation and vomiting.  Genitourinary: Negative for dysuria, flank pain, frequency, hematuria, penile pain, scrotal swelling and urgency.  Musculoskeletal: Negative for back pain, myalgias, neck pain and neck stiffness.  Skin: Negative for rash and wound.  Allergic/Immunologic: Negative for immunocompromised state.  Neurological: Negative for syncope, weakness, numbness and headaches.  Psychiatric/Behavioral: Negative for confusion.   Physical Exam Updated Vital Signs BP (!) 130/94   Pulse 91   Temp 99.2 F (37.3 C) (Oral)   Resp 16   Ht  (1.676 m)   Wt 93 kg   SpO2 97%   BMI 33.09 kg/m   Physical Exam Vitals signs and nursing note reviewed.  Constitutional:      Appearance: He is well-developed.  HENT:     Head: Normocephalic.  Eyes:     Conjunctiva/sclera: Conjunctivae normal.  Neck:     Musculoskeletal: Normal range of motion and neck supple. No neck rigidity or muscular tenderness.     Vascular: No carotid bruit.  Cardiovascular:     Rate and Rhythm: Normal rate and regular rhythm.     Pulses: Normal pulses.     Heart sounds: Normal heart sounds. No murmur. No friction rub. No gallop.   Pulmonary:     Effort:  Pulmonary effort is normal. No respiratory distress.     Breath sounds: Normal breath sounds. No stridor. No wheezing, rhonchi or rales.  Chest:     Chest wall: No tenderness.  Abdominal:     General: There is no distension.     Palpations: Abdomen is soft. There is no mass.     Tenderness: There is abdominal tenderness. There is no right CVA tenderness, left CVA tenderness, guarding or rebound.     Hernia: No hernia is present.     Comments: Hyperactive bowel sounds in all 4 quadrants.  Inconsistent exam.  Patient has guarding with palpation by hand of the epigastric region, but no pain, guarding, or grimacing with stethoscope palpation in the epigastric region.  Left and right upper quadrants are otherwise nontender.  Lower abdomen is unremarkable.  Abdomen  is soft, nondistended.  Negative Murphy sign.  No CVA tenderness bilaterally.  Lymphadenopathy:     Cervical: No cervical adenopathy.  Skin:    General: Skin is warm and dry.     Comments: Chronic appearing scaling rash noted to the mid lower abdomen.  No weeping, drainage, erythema, warmth, or edema.  Neurological:     Mental Status: He is alert.  Psychiatric:        Behavior: Behavior normal.      ED Treatments / Results  Labs (all labs ordered are listed, but only abnormal results are displayed) Labs Reviewed  CBC - Abnormal; Notable for the following components:      Result Value   WBC 3.4 (*)    All other components within normal limits  COMPREHENSIVE METABOLIC PANEL - Abnormal; Notable for the following components:   Sodium 131 (*)    Glucose, Bld 130 (*)    Calcium 8.5 (*)    Total Protein 8.2 (*)    ALT 48 (*)    All other components within normal limits  LIPASE, BLOOD    EKG None  Radiology No results found.  Procedures Procedures (including critical care time)  Medications Ordered in ED Medications  alum & mag hydroxide-simeth (MAALOX/MYLANTA) 200-200-20 MG/5ML suspension 30 mL (30 mLs Oral Given  12/19/18 0535)    And  lidocaine (XYLOCAINE) 2 % viscous mouth solution 15 mL (15 mLs Oral Given 12/19/18 0535)     Initial Impression / Assessment and Plan / ED Course  I have reviewed the triage vital signs and the nursing notes.  Pertinent labs & imaging results that were available during my care of the patient were reviewed by me and considered in my medical decision making (see chart for details).        53 year old male with a history of alcohol use disorder, cocaine use disorder, chronic nausea, HTN, H. pylori, GERD, and anxiety who is followed by Dr. Adela LankArmbruster with gastroenterology presenting with burning epigastric pain that is been intermittent over the last few weeks.  Pain is brought on after eating spicy food.  He also endorses chronic nausea, which has been present for over 2 years.  He has no constitutional symptoms.  Per chart review, he had a positive H. pylori breath test in the past.  It is uncertain if he ever completed therapy.  I reviewed Dr. Lanetta InchArmbruster's most recent outpatient clinic note on 3/23.  He planed to try the patient on Phenergan as needed for nausea and give him a 40 mg once daily omeprazole trial to treat dyspepsia with an upper endoscopy scheduled in the future.  I think it is reasonable to check his LFTs today since labs have been ordered since November and outpatient resources are limited secondary to COVID-19 pandemic.   Minimal elevation of ALT, but labs are otherwise unremarkable.  Doubt cholecystitis.  Lipase is normal so low suspicion for pancreatitis.  Hemoglobin is normal and the patient is not endorsing melena or hematochezia.  Low suspicion for perforated peptic ulcer.  On exam, the patient did have a scaling rash noted to the mid lower abdomen that appears chronic.  I question if the patient has a sensitivity to nickel as the rash is located near the area where the button and zipper on his jeans is located.  He reports that he has been treating this at  home with a steroid cream.  Does not appear infectious.   On re-evaluation, patient reports his pain has resolved  after GI cocktail.  Strongly suspect GERD.  Doubt alcoholic gastritis, diverticulitis, mesenteric ischemia.  Vitals have been reassuring.  He is hemodynamically stable and in no acute distress.  He has been provided the prescriptions that he was unable to obtain after his telemedicine visit, but I suspect that they are probably filled and waiting at his pharmacy.  Discharge instructions have been given in both [place] and [place].  Safe for discharge home with outpatient follow-up.  Final Clinical Impressions(s) / ED Diagnoses   Final diagnoses:  Gastroesophageal reflux disease, esophagitis presence not specified  Chronic nausea    ED Discharge Orders         Ordered    promethazine (PHENERGAN) 12.5 MG tablet  Every 6 hours PRN     12/19/18 0547    omeprazole (PRILOSEC) 40 MG capsule  Daily     12/19/18 0547           Barkley Boards, PA-C 12/19/18 0617    Ward, Layla Maw, DO 12/19/18 [phone]

## 2018-12-19 NOTE — ED Triage Notes (Signed)
Pt c/o abd pain off & on x 1 week.  Pt stated "I called my doctor and she called in some medicine for my stomach but it didn't get called in.  When I eat something spicy it hurts.  They told me they going to call me and make me some kind of test in my stomach."  Pt indicating has left flank pain.  Pt denies vomiting.

## 2018-12-19 NOTE — Discharge Instructions (Signed)
Gracias por permitirme cuidardelo hoy en el Departamento de [address].   Tu anlisis de sangre fue tranquilizador hoy.  Los sntomas son consistentes con la Merchant navy officer.  Se adjunta informacin adicional sobre cmo prevenir los sntomas.  Evite comer al menos 3 horas antes de irse a la cama e intente dormir con la cabeza de la cama elevada a 30 grados.  Los alimentos picantes pueden [employer] sntomas.  Le he dado [place] receta de los medicamentos que el Dr. Sherolyn Buba recomendado.  T[place] 1 comprimido de omeprazol al da para la Programmer, systems.   Le administraron Maalox en el servicio de urgencias, lo que mejor sus sntomas.  Usted puede comprar este sin receta y Chemical engineer como se indica en la etiqueta para la acidez estomacal.  Tambin puede tomar 1 tableta de Phenergan cada 6 horas segn sea necesario para nuseas o vmitos.  Este fue el medicamento que el Dr. Adela Lank le haba recetado despus de su cita por telfono.  Regrese al departamento de emergencias si desarrolla heces negras o sangrientas, fiebre alta con dolor abdominal, vmitos persistentes a pesar de tomar Phenergan, u otros nuevos, con respecto a los sntomas.  Thank you for allowing me to care for you today in the Emergency Department.   Your blood work was reassuring today.  Your symptoms are consistent with heartburn.  Additional information about preventing the symptoms is attached.  Avoid eating at least 3 hours before you go to bed and try to sleep with the head of the bed elevated at 30 degrees.  Spicy foods can make the symptoms worse.  I have given you a prescription of the medications that Dr. Adela Lank had recommended.  Take 1 tablet of omeprazole daily for heartburn/GERD.   You were given Maalox in the emergency department, which improved your symptoms.  You can purchase this over-the-counter and use as directed on the label for heartburn.  You may also take 1 tablet of Phenergan every 6 hours  as needed for nausea or vomiting.  This was the medication that Dr. Adela Lank had prescribed for you after your appointment by phone.  Return to the emergency department if you develop black or bloody stools, high fever with abdominal pain, persistent vomiting despite taking Phenergan, or other new, concerning symptoms.

## 2018-12-19 NOTE — ED Provider Notes (Signed)
MRN: [id number] DOB: 20-Jun-1966  Subjective:   Wesley [name] is a 53 y.o. male presenting for acute onset moderate to severe constant sharp throbbing left-sided flank pain. He hydrates very well. States that he has decreased appetite. Patient was seen earlier today at the [facility] long ER, had labs done.  States that he developed flank pain shortly after he left the ER and has been constant.  Labs were done, he had negative lipase, borderline elevated AST and borderline changes of his sodium, calcium and protein.  He was given a GI cocktail in the ER with improvement and was deemed safe for discharge to follow-up with his GI doctor.  He is currently being managed by Dr. Adela Lank.  See notes from [facility] long ER for further detail.  Plan is to schedule patient for an endoscopy and monitor LFTs for transaminitis.  He was advised to follow-up with his PCP for management of his mental health in general complaints.  No current facility-administered medications for this encounter.   Current Outpatient Medications:  .  acetaminophen (TYLENOL) 500 MG tablet, Take 1,000 mg by mouth daily as needed (back pain)., Disp: , Rfl:  .  amLODipine (NORVASC) 10 MG tablet, Take 1 tablet (10 mg total) by mouth daily., Disp: 90 tablet, Rfl: 0 .  benzonatate (TESSALON) 100 MG capsule, Take 1 capsule by mouth every 8 (eight) hours for cough., Disp: 21 capsule, Rfl: 0 .  cyclobenzaprine (FLEXERIL) 5 MG tablet, Take 1-2 tablets (5-10 mg total) by mouth 2 (two) times daily as needed for muscle spasms., Disp: 30 tablet, Rfl: 0 .  hydroxypropyl methylcellulose / hypromellose (ISOPTO TEARS / GONIOVISC) 2.5 % ophthalmic solution, Place 1 drop into both eyes 3 (three) times daily as needed for dry eyes., Disp: , Rfl:  .  lisinopril (PRINIVIL,ZESTRIL) 5 MG tablet, Take 1 tablet (5 mg total) by mouth daily., Disp: 90 tablet, Rfl: 3 .  meclizine (ANTIVERT) 12.5 MG tablet, Take 1 tablet (12.5 mg total) by mouth 3 (three) times daily as  needed for dizziness., Disp: 30 tablet, Rfl: 0 .  nystatin cream (MYCOSTATIN), Apply 1 application topically 2 (two) times daily., Disp: 30 g, Rfl: 0 .  omeprazole (PRILOSEC) 40 MG capsule, Take 1 capsule (40 mg total) by mouth daily., Disp: 90 capsule, Rfl: 1 .  predniSONE (DELTASONE) 20 MG tablet, One daily with food, Disp: 5 tablet, Rfl: 0 .  promethazine (PHENERGAN) 12.5 MG tablet, Take 1 tablet (12.5 mg total) by mouth every 6 (six) hours as needed for nausea (caution - this can cause sedation)., Disp: 30 tablet, Rfl: 0 .  triamcinolone cream (KENALOG) 0.1 %, Apply 1 application topically 2 (two) times daily., Disp: 30 g, Rfl: 0   Allergies  Allergen Reactions  . Ibuprofen Other (See Comments) and Rash    Makes his throat get "very dry" after taking it States he cant take it  . Other Other (See Comments)    Steroids: Patient received a steroid shot from [facility] ([id number]) [phone] and it resulted in his muscles twitching all over his body  . Zofran [Ondansetron Hcl] Other (See Comments)    Causes sweating and body "feels badly"    Past Medical History:  Diagnosis Date  . Alcohol abuse   . Anxiety   . Anxiety   . Chronic neck pain   . Cocaine abuse (HCC)   . Dizziness   . Hypercholesteremia   . Hypertension   . Prediabetes      Past Surgical  History:  Procedure Laterality Date  . EYE SURGERY Right ~ 1980   "hit my eye"    ROS Denies vomiting, loose stools, constipation, bloody stools, changes in bowel habits, history of diverticulitis.   Objective:   Vitals: BP 117/79   Pulse (!) 107   Temp 100.2 F (37.9 C) (Oral)   Resp 16   SpO2 96%   Physical Exam Constitutional:      General: He is not in acute distress.    Appearance: Normal appearance. He is well-developed. He is not ill-appearing, toxic-appearing or diaphoretic.  HENT:     Head: Normocephalic and atraumatic.     Right Ear: External ear normal.     Left Ear: External ear normal.     Nose:  Nose normal.     Mouth/Throat:     Mouth: Mucous membranes are moist.     Pharynx: Oropharynx is clear.  Eyes:     General: No scleral icterus.    Extraocular Movements: Extraocular movements intact.     Pupils: Pupils are equal, round, and reactive to light.  Cardiovascular:     Rate and Rhythm: Normal rate and regular rhythm.     Heart sounds: Normal heart sounds. No murmur. No friction rub. No gallop.   Pulmonary:     Effort: Pulmonary effort is normal. No respiratory distress.     Breath sounds: Normal breath sounds. No stridor. No wheezing, rhonchi or rales.  Abdominal:     General: Bowel sounds are normal. There is no distension.     Palpations: Abdomen is soft. There is no mass.     Tenderness: There is abdominal tenderness (Generalized over left side). There is no right CVA tenderness, left CVA tenderness, guarding or rebound.  Musculoskeletal:     Lumbar back: He exhibits tenderness (Over area depicted). He exhibits normal range of motion, no bony tenderness, no swelling, no edema and no spasm.       Back:  Skin:    General: Skin is warm and dry.  Neurological:     Mental Status: He is alert and oriented to person, place, and time.  Psychiatric:        Mood and Affect: Mood normal.        Behavior: Behavior normal.        Thought Content: Thought content normal.    Results for orders placed or performed during the hospital encounter of 12/19/18 (from the past 24 hour(s))  POCT urinalysis dip (device)     Status: Abnormal   Collection Time: 12/19/18  5:46 PM  Result Value Ref Range   Glucose, UA NEGATIVE NEGATIVE mg/dL   Bilirubin Urine NEGATIVE NEGATIVE   Ketones, ur NEGATIVE NEGATIVE mg/dL   Specific Gravity, Urine 1.020 1.005 - 1.030   Hgb urine dipstick TRACE (A) NEGATIVE   pH 6.5 5.0 - 8.0   Protein, ur 30 (A) NEGATIVE mg/dL   Urobilinogen, UA 0.2 0.0 - 1.0 mg/dL   Nitrite NEGATIVE NEGATIVE   Leukocytes,Ua NEGATIVE NEGATIVE    Assessment and Plan :    Flank pain  Left flank pain  Left sided abdominal pain  Microscopic hematuria  Counseled patient on possibility of renal colic.  Given his history of drug abuse, I will have patient schedule Tylenol.  We are using a lower dose in light of his transaminitis.  We will also can use Flomax to help him pass possible kidney stone.  Patient is to maintain medications he was prescribed earlier today.  Strict  ER and return to clinic precautions reviewed.    Wallis Bamberg, PA-C 12/19/18 1758

## 2018-12-19 NOTE — ED Triage Notes (Signed)
PT WAS SEEN AT [facility] TODAY

## 2018-12-19 NOTE — ED Triage Notes (Signed)
PT reports left flank pain that started last night. Hurts the most when he is laying down.

## 2018-12-21 ENCOUNTER — Other Ambulatory Visit: Payer: Self-pay

## 2018-12-21 ENCOUNTER — Encounter (HOSPITAL_COMMUNITY): Payer: Self-pay | Admitting: *Deleted

## 2018-12-21 ENCOUNTER — Ambulatory Visit (HOSPITAL_COMMUNITY)
Admission: EM | Admit: 2018-12-21 | Discharge: 2018-12-21 | Disposition: A | Discharge status: Home / Self Care | Payer: Self-pay | Attending: Emergency Medicine | Admitting: Emergency Medicine

## 2018-12-21 DIAGNOSIS — R05 Cough: Secondary | ICD-10-CM

## 2018-12-21 DIAGNOSIS — R1013 Epigastric pain: Secondary | ICD-10-CM

## 2018-12-21 DIAGNOSIS — R059 Cough, unspecified: Secondary | ICD-10-CM

## 2018-12-21 MED ORDER — BENZONATATE 100 MG PO CAPS: 100.0000 mg | ORAL_CAPSULE | Freq: Three times a day (TID) | ORAL | 0 refills | Status: DC

## 2018-12-21 NOTE — ED Triage Notes (Addendum)
C/O left-sided abd pain with tactile fever x 2 days.  Denies n/v/d.  C/O "little bit" cough.   Pt seen in ED and [facility] 2 days ago.

## 2018-12-21 NOTE — Discharge Instructions (Addendum)
Recommended further evaluation and management in the ED for unexplained epigastric discomfort with fever, but patient declines at this time Declines GI cocktail in office, request cough medication Tessalon perles prescribed.  Take as needed for cough.   Patient will continue with prescribed medications from ED and UC visits on 12/19/2018 Avoid eating 2-3 hours before bed Elevate head of bed.  Avoid chocolate, caffeine, alcohol, onion, and mint prior to bed.  This relaxes the bottom part of your esophagus and can make your symptoms worse.  Follow up with GI specialist for further evaluation and management of chronic abdominal pain.   Call or go to the ED if you have any new or worsening symptoms  If you experience new or worsening symptoms return or go to ER such as fever, chills, nausea, vomiting, diarrhea, bloody or dark tarry stools, constipation, urinary symptoms, worsening abdominal discomfort, symptoms that do not improve with medications, inability to keep fluids down, etc..Marland Kitchen

## 2018-12-21 NOTE — ED Provider Notes (Signed)
[facility]   [id number] 12/21/18 Arrival Time: 1503  CC: ABDOMINAL DISCOMFORT and cough  SUBJECTIVE:  Wesley [name] is a 53 y.o. male hx significant for alcohol use disorder, cocaine use disorder, chronic nausea, HTN, H. pylori, GERD, and anxiety who presents with complaint of left sided abdominal pain x 2 days.  Recently finished a 5 day course of prednisone for "fever."  Describes abdominal pain as stable, intermittent and throbbing in character.  Has tried prednisone with relief.  Has tried taking tylenol with relief.  Last dose of tylenol this morning.  Denies alleviating or aggravating factors.  Denies association with eating or using the restroom.  Reports similar symptoms in the past related to ulcer.  [facility].  [facility] and [facility].  Prescribed phenegran as well as omeprazole for GERD and chronic nausea in the ED.  [facility]  Blood work drawn in the ED showed mildly elevated AST, and borderline changes in NA, CA, and protein.  Last BM this morning and normal for patient.  Complains of subjective fever, and decrease appetite.    Also mentions productive white cough that began this morning.  Denies sick exposure to covid or recent travel.  Has not tried OTC medication. Denies rhinorrhea, nasal congestion, sore throat.     Denies chills, nausea, vomiting, chest pain, SOB, diarrhea, constipation, hematochezia, melena, dysuria, difficulty urinating, increased frequency or urgency, flank pain, loss of bowel or bladder function.    No LMP for male patient.  ROS: As per HPI.  Past Medical History:  Diagnosis Date  . Alcohol abuse   . Anxiety   . Anxiety   . Chronic neck pain   . Cocaine abuse (HCC)   . Dizziness   . Hypercholesteremia   . Hypertension   . Prediabetes    Past Surgical History:  Procedure Laterality Date  . EYE SURGERY Right ~ 1980   "hit  my eye"   Allergies  Allergen Reactions  . Ibuprofen Other (See Comments) and Rash    Makes his throat get "very dry" after taking it States he cant take it  . Other Other (See Comments)    Steroids: Patient received a steroid shot from [facility] ([id number]) [phone] and it resulted in his muscles twitching all over his body  . Zofran [Ondansetron Hcl] Other (See Comments)    Causes sweating and body "feels badly"   No current facility-administered medications on file prior to encounter.    Current Outpatient Medications on File Prior to Encounter  Medication Sig Dispense Refill  . acetaminophen (TYLENOL) 500 MG tablet Take 1 tablet (500 mg total) by mouth every 6 (six) hours as needed. 30 tablet 0  . amLODipine (NORVASC) 10 MG tablet Take 1 tablet (10 mg total) by mouth daily. 90 tablet 0  . cyclobenzaprine (FLEXERIL) 5 MG tablet Take 1-2 tablets (5-10 mg total) by mouth 2 (two) times daily as needed for muscle spasms. 30 tablet 0  . lisinopril (PRINIVIL,ZESTRIL) 5 MG tablet Take 1 tablet (5 mg total) by mouth daily. 90 tablet 3  . omeprazole (PRILOSEC) 40 MG capsule Take 1 capsule (40 mg total) by mouth daily. 90 capsule 1  . meclizine (ANTIVERT) 12.5 MG tablet Take 1 tablet (12.5 mg total) by mouth 3 (three) times daily as needed for dizziness. 30 tablet 0   Social History   Socioeconomic  History  . Marital status: Significant Other    Spouse name: Not on file  . Number of children: Not on file  . Years of education: Not on file  . Highest education level: Not on file  Occupational History  . Not on file  Social Needs  . Financial resource strain: Not on file  . Food insecurity:    Worry: Not on file    Inability: Not on file  . Transportation needs:    Medical: Not on file    Non-medical: Not on file  Tobacco Use  . Smoking status: Former Games developer  . Smokeless tobacco: Never Used  Substance and Sexual Activity  . Alcohol use: Not Currently    Comment: former  .  Drug use: Not Currently    Comment: denies, reports past history   . Sexual activity: Not on file  Lifestyle  . Physical activity:    Days per week: Not on file    Minutes per session: Not on file  . Stress: Not on file  Relationships  . Social connections:    Talks on phone: Not on file    Gets together: Not on file    Attends religious service: Not on file    Active member of club or organization: Not on file    Attends meetings of clubs or organizations: Not on file    Relationship status: Not on file  . Intimate partner violence:    Fear of current or ex partner: Not on file    Emotionally abused: Not on file    Physically abused: Not on file    Forced sexual activity: Not on file  Other Topics Concern  . Not on file  Social History Narrative  . Not on file   Family History  Problem Relation Age of Onset  . Hypertension Mother   . Hypertension Father      OBJECTIVE:  Vitals:   12/21/18 1528  BP: (!) 149/84  Pulse: 100  Temp: 98.6 F (37 C)  TempSrc: Oral  SpO2: 99%    General appearance: Alert; NAD HEENT: NCAT.  Oropharynx clear.  Lungs: clear to auscultation bilaterally without adventitious breath sounds; cough absent Heart: regular rate and rhythm.  Radial pulses 2+ symmetrical bilaterally Abdomen: soft, non-distended; normal active bowel sounds; mildly TTP over epigastric region; nontender at McBurney's point; negative Murphy's sign; negative rebound; no guarding Back: no CVA tenderness Extremities: no edema; symmetrical with no gross deformities Skin: warm and dry Neurologic: normal gait Psychological: alert and cooperative; normal mood and affect  ASSESSMENT & PLAN:  1. Epigastric discomfort   2. Cough     Meds ordered this encounter  Medications  . benzonatate (TESSALON) 100 MG capsule    Sig: Take 1 capsule (100 mg total) by mouth every 8 (eight) hours.    Dispense:  21 capsule    Refill:  0    Order Specific Question:   Supervising  Provider    Answer:   Eustace Moore [[id number]]    Recommended further evaluation and management in the ED for unexplained epigastric discomfort with fever, but patient declines at this time Declines GI cocktail in office, request cough medication Tessalon perles prescribed.  Take as needed for cough.   Patient will continue with prescribed medications from ED and UC visits on 12/19/2018 Avoid eating 2-3 hours before bed Elevate head of bed.  Avoid chocolate, caffeine, alcohol, onion, and mint prior to bed.  This relaxes the bottom part of your  esophagus and can make your symptoms worse.  Follow up with GI specialist for further evaluation and management of chronic abdominal pain.   Call or go to the ED if you have any new or worsening symptoms  If you experience new or worsening symptoms return or go to ER such as fever, chills, nausea, vomiting, diarrhea, bloody or dark tarry stools, constipation, urinary symptoms, worsening abdominal discomfort, symptoms that do not improve with medications, inability to keep fluids down, etc...  Reviewed expectations re: course of current medical issues. Questions answered. Outlined signs and symptoms indicating need for more acute intervention. Patient verbalized understanding. After Visit Summary given.   Rennis Harding, PA-C 12/21/18 1628

## 2018-12-22 ENCOUNTER — Other Ambulatory Visit: Payer: Self-pay

## 2018-12-22 ENCOUNTER — Encounter (HOSPITAL_COMMUNITY): Payer: Self-pay | Admitting: Emergency Medicine

## 2018-12-22 ENCOUNTER — Emergency Department (HOSPITAL_COMMUNITY)
Admission: EM | Admit: 2018-12-22 | Discharge: 2018-12-22 | Disposition: A | Discharge status: Home / Self Care | Payer: Self-pay | Attending: Emergency Medicine | Admitting: Emergency Medicine

## 2018-12-22 DIAGNOSIS — R110 Nausea: Secondary | ICD-10-CM | POA: Insufficient documentation

## 2018-12-22 DIAGNOSIS — R1013 Epigastric pain: Secondary | ICD-10-CM | POA: Insufficient documentation

## 2018-12-22 DIAGNOSIS — Z87891 Personal history of nicotine dependence: Secondary | ICD-10-CM | POA: Insufficient documentation

## 2018-12-22 DIAGNOSIS — I10 Essential (primary) hypertension: Secondary | ICD-10-CM | POA: Insufficient documentation

## 2018-12-22 DIAGNOSIS — Z79899 Other long term (current) drug therapy: Secondary | ICD-10-CM | POA: Insufficient documentation

## 2018-12-22 LAB — COMPREHENSIVE METABOLIC PANEL
ALT: 49 U/L — ABNORMAL HIGH (ref 0–44)
AST: 36 U/L (ref 15–41)
Albumin: 4.1 g/dL (ref 3.5–5.0)
Alkaline Phosphatase: 77 U/L (ref 38–126)
Anion gap: 11 (ref 5–15)
BUN: 10 mg/dL (ref 6–20)
CO2: 24 mmol/L (ref 22–32)
Calcium: 8.4 mg/dL — ABNORMAL LOW (ref 8.9–10.3)
Chloride: 98 mmol/L (ref 98–111)
Creatinine, Ser: 0.85 mg/dL (ref 0.61–1.24)
GFR calc Af Amer: >60 mL/min (ref >60)
GFR calc non Af Amer: >60 mL/min (ref >60)
Glucose, Bld: 110 mg/dL — ABNORMAL HIGH (ref 70–99)
Potassium: 4 mmol/L (ref 3.5–5.1)
Sodium: 133 mmol/L — ABNORMAL LOW (ref 135–145)
Total Bilirubin: 0.4 mg/dL (ref 0.3–1.2)
Total Protein: 8.5 g/dL — ABNORMAL HIGH (ref 6.5–8.1)

## 2018-12-22 LAB — CBC WITH DIFFERENTIAL/PLATELET
Abs Immature Granulocytes: 0.02 10*3/uL (ref 0.00–0.07)
Basophils Absolute: 0 10*3/uL (ref 0.0–0.1)
Basophils Relative: 0 %
Eosinophils Absolute: 0 10*3/uL (ref 0.0–0.5)
Eosinophils Relative: 0 %
HCT: 47.2 % (ref 39.0–52.0)
Hemoglobin: 14.9 g/dL (ref 13.0–17.0)
Immature Granulocytes: 0 %
Lymphocytes Relative: 21 %
Lymphs Abs: 1.2 10*3/uL (ref 0.7–4.0)
MCH: 26.4 pg (ref 26.0–34.0)
MCHC: 31.6 g/dL (ref 30.0–36.0)
MCV: 83.5 fL (ref 80.0–100.0)
Monocytes Absolute: 0.5 10*3/uL (ref 0.1–1.0)
Monocytes Relative: 9 %
Neutro Abs: 3.8 10*3/uL (ref 1.7–7.7)
Neutrophils Relative %: 70 %
Platelets: 277 10*3/uL (ref 150–400)
RBC: 5.65 MIL/uL (ref 4.22–5.81)
RDW: 13.5 % (ref 11.5–15.5)
WBC: 5.5 10*3/uL (ref 4.0–10.5)
nRBC: 0 % (ref 0.0–0.2)

## 2018-12-22 LAB — LIPASE, BLOOD: Lipase: 40 U/L (ref 11–51)

## 2018-12-22 MED ORDER — LIDOCAINE VISCOUS HCL 2 % MT SOLN
15.0000 mL | Freq: Once | OROMUCOSAL | Status: AC
  Administered 2018-12-22: 15 mL via ORAL
  Filled 2018-12-22: qty 15

## 2018-12-22 MED ORDER — SUCRALFATE 1 GM/10ML PO SUSP: 1.0000 g | Freq: Three times a day (TID) | ORAL | 0 refills | Status: DC

## 2018-12-22 MED ORDER — SODIUM CHLORIDE 0.9 % IV BOLUS: 1000.0000 mL | Freq: Once | INTRAVENOUS | Status: DC

## 2018-12-22 MED ORDER — ALUM & MAG HYDROXIDE-SIMETH 200-200-20 MG/5ML PO SUSP
30.0000 mL | Freq: Once | ORAL | Status: AC
  Administered 2018-12-22: 30 mL via ORAL
  Filled 2018-12-22: qty 30

## 2018-12-22 MED ORDER — PROMETHAZINE HCL 25 MG/ML IJ SOLN
25.0000 mg | Freq: Four times a day (QID) | INTRAMUSCULAR | Status: DC | PRN
  Administered 2018-12-22: 25 mg via INTRAMUSCULAR
  Filled 2018-12-22: qty 1

## 2018-12-22 NOTE — ED Notes (Addendum)
Patient refused IV. Patient stated that he doesn't want an IV. This RN will not be able to administer IV fluids.

## 2018-12-22 NOTE — ED Provider Notes (Signed)
Signout at shift change from Elpidio Anis, PA-C See previous provider note for full H&P  Briefly, patient is well known to the emergency department with history of alcohol use, cocaine abuse, HTN, GERD who presents with upper abdominal pain. He has a care plan, however he is tachycardic and this cannot be accounted for. He has not been drinking and his recent UDS was clear. He is currently attempting to establish care with GI, Dr. Adela Lank, who recommended EGD following COVID pandemic. At shift change, labs are pending with plan to compare to 12/19/2018.   Add carafate. Consider treatment for H. Pylori, as he has tested positive in 2018.  7:56 AM I was made aware by RN that patient refused IV and so IV fluids ordered by previous provider will not be able to be administered.  8:20 AM labs have all returned unremarkable. Tachycardia has resolved. Patient is feeling much better after GI cocktail and has no pain.  Patient will be discharged home with Carafate in addition to his current medication regimen.  Patient unable to provide stool sample for H. pylori test today, so will defer to GI for this test.  Return precautions discussed.  Patient understands and agrees with plan.  Patient vital stable throughout ED course and discharged in satisfactory condition.  Blood pressure (!) 143/98, pulse 99, temperature 99.2 F (37.3 C), temperature source Oral, resp. rate 17, height 5\' 6"  (1.676 m), weight 93 kg, SpO2 95 %.    Emi Holes, PA-C 12/22/18 0827    Shon Baton, MD 12/23/18 [phone]

## 2018-12-22 NOTE — ED Provider Notes (Signed)
[facility]-EMERGENCY DEPT Provider Note   CSN: [id number] Arrival date & time: 12/22/18  [id number]    History   Chief Complaint Chief Complaint  Patient presents with  . Abdominal Pain    HPI Wesley LitterJose [name] is a 53 y.o. male.     Patient to ED with complaint of epigastric abdominal pain x 2 days. He denies fever. He has nausea but no vomiting. He reports his last bowel movement was yesterday morning and denies melena or bloody stools. No chest pain or SOB. He has been seen and evaluated multiple times in the last several days (4/9 URgent Care; 4/9 ED; 4/10 Urgent Care) and is currently taking Prilosec and Phenergan for his symptoms. He returns tonight because his pain is uncontrolled. The patient has a history of alcohol abuse and states he stopped drinking 3 years ago. He denies regular use of NSAIDs.  On chart review, the patient is established with Dr. Adela LankArmbruster (GI) who had a telemedicine appointment with the patient on 3/23 for epigastric pain x 2 years. The patient confirms his symptoms have been ongoing for 2 years, not just 2 days. He also states he has an appointment with Dr. Adela LankArmbruster next week for an EGD study to evaluate for ulcers. On review of Dr. Lanetta InchArmbruster's note of 3/23, an EGD has been recommended and will be scheduled as soon as possible given the current limitations during the COVID pandemic. It has NOT been scheduled.   The history is provided by the patient and medical records. No language interpreter was used.  Abdominal Pain  Associated symptoms: nausea   Associated symptoms: no chills, no constipation, no diarrhea, no fever and no vomiting     Past Medical History:  Diagnosis Date  . Alcohol abuse   . Anxiety   . Anxiety   . Chronic neck pain   . Cocaine abuse (HCC)   . Dizziness   . Hypercholesteremia   . Hypertension   . Prediabetes     Patient Active Problem List   Diagnosis Date Noted  . Legally blind in right eye, as defined  in [place] 05/02/2017  . Prediabetes 04/23/2017  . Muscle spasm 04/23/2017  . Essential hypertension 04/16/2017  . H/O medication noncompliance 04/02/2017  . Vitamin D deficiency 04/02/2017  . DJD (degenerative joint disease) of cervical spine 03/16/2017  . 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 Dizziness 01/23/2017  . Anxiety 11/21/2016    Past Surgical History:  Procedure Laterality Date  . EYE SURGERY Right ~ 1980   "hit my eye"        Home Medications    Prior to Admission medications   Medication Sig Start Date End Date Taking? Authorizing Provider  acetaminophen (TYLENOL) 500 MG tablet Take 1 tablet (500 mg total) by mouth every 6 (six) hours as needed. 12/19/18   Wallis BambergMani, Mario, PA-C  amLODipine (NORVASC) 10 MG tablet Take 1 tablet (10 mg total) by mouth daily. 10/17/18 01/15/19  Mardella LaymanHagler, Brian, MD  benzonatate (TESSALON) 100 MG capsule Take 1 capsule (100 mg total) by mouth every 8 (eight) hours. 12/21/18   Wurst, Gren[place]itt[place], PA-C  cyclobenzaprine (FLEXERIL) 5 MG tablet Take 1-2 tablets (5-10 mg total) by mouth 2 (two) times daily as needed for muscle spasms. 08/18/18   Lurene ShadowPhelps, Erin O, PA-C  lisinopril (PRINIVIL,ZESTRIL) 5 MG tablet Take 1 tablet (5 mg total) by mouth daily. 12/15/18   Elvina SidleLauenstein, Kurt, MD  meclizine (ANTIVERT) 12.5 MG tablet Take 1 tablet (12.5 mg total) by mouth 3 (three)  times daily as needed for dizziness. 11/05/18   Mardella Layman, MD  omeprazole (PRILOSEC) 40 MG capsule Take 1 capsule (40 mg total) by mouth daily. 12/19/18   McDonald, Mia A, PA-C    Family History Family History  Problem Relation Age of Onset  . Hypertension Mother   . Hypertension Father     Social History Social History   Tobacco Use  . Smoking status: Former Games developer  . Smokeless tobacco: Never Used  Substance Use Topics  . Alcohol use: Not Currently    Comment: former  . Drug use: Not Currently    Comment: denies, reports past history      Allergies   Ibuprofen; Other; and Zofran [ondansetron hcl]    Review of Systems Review of Systems  Constitutional: Negative for chills and fever.  HENT: Negative.   Respiratory: Negative.   Cardiovascular: Negative.   Gastrointestinal: Positive for abdominal pain and nausea. Negative for blood in stool, constipation, diarrhea and vomiting.  Genitourinary: Negative.   Musculoskeletal: Negative.   Skin: Negative.   Neurological: Negative.      Physical Exam Updated Vital Signs BP (!) 136/93 (BP Location: Left Arm)   Pulse (!) 114   Temp 99.2 F (37.3 C) (Oral)   Resp 16   Ht 5\' 6"  (1.676 m)   Wt 93 kg   SpO2 96%   BMI 33.09 kg/m   Physical Exam Vitals signs and nursing note reviewed.  Constitutional:      Appearance: He is well-developed.  HENT:     Head: Normocephalic.  Neck:     Musculoskeletal: Normal range of motion and neck supple.  Cardiovascular:     Rate and Rhythm: Regular rhythm. Tachycardia present.  Pulmonary:     Effort: Pulmonary effort is normal.     Breath sounds: Normal breath sounds.  Abdominal:     General: Bowel sounds are normal. There is no distension.     Palpations: Abdomen is soft.     Tenderness: There is abdominal tenderness in the epigastric area. There is no guarding or rebound.     Hernia: There is no hernia in the umbilical area or ventral area.  Musculoskeletal: Normal range of motion.  Skin:    General: Skin is warm and dry.     Findings: No rash.  Neurological:     Mental Status: He is alert.     Cranial Nerves: No cranial nerve deficit.      ED Treatments / Results  Labs (all labs ordered are listed, but only abnormal results are displayed) Labs Reviewed - No data to display  EKG None  Radiology No results found.  Procedures Procedures (including critical care time)  Medications Ordered in ED Medications  promethazine (PHENERGAN) injection 25 mg (has no administration in time range)     Initial Impression / Assessment and Plan / ED Course  I have reviewed the triage  vital signs and the nursing notes.  Pertinent labs & imaging results that were available during my care of the patient were reviewed by me and considered in my medical decision making (see chart for details).        Patient returns to ED with epigastric abdominal pain. Seen multiple times over the last 3 days, on Prilosec 40 mg daily and PHenergan.   The patient has a history of alcohol abuse but has not used in 3 years, per patient. Labs reviewed and all ETOH tests in the recent past are negative. He has a history  of cocaine abuse, but all drug screens in the recent past have been negative. He is currently attempting to get established with GI and has an EGD pending scheduling availability given COVID environment. He is here because of uncontrolled pain. Per Dr. Lanetta Inch notes, he has a history of positive H. Pylori test in May 2018 and was given antibiotics for this.   He is tachycardic on arrival. Possibly from pain, however, will check labs to compare with recent to insure no acute process. He has been given Phenergan for nausea and a GI cocktail for pain relief. Will give a liter of fluids.   Ultimately, if pain becomes controlled and labs are stable, he is felt appropriate for discharge home. He has a primary care physician and should be encouraged to see her for symptom control until his EGD is available and GI evaluation can be taken further.  Patient care signed out to Glenford Bayley, PA-C, pending re-evaluation of pain, tachycardia. Consider adding carafate to his medication regimen.   Final Clinical Impressions(s) / ED Diagnoses   Final diagnoses:  None   1. Epigastric abdominal pain 2. Chronic nausea  ED Discharge Orders    None       Elpidio Anis, PA-C 12/22/18 1610    Shon Baton, MD 12/23/18 [phone]

## 2018-12-22 NOTE — ED Notes (Signed)
Patient given discharge teaching and verbalized understanding. Patient ambulated out of ED with a steady gait. 

## 2018-12-22 NOTE — Discharge Instructions (Signed)
Add Carafate to your medication regimen.  Please follow-up with Dr. Adela Lank for further evaluation and treatment of your ongoing abdominal pain.  Please return to the emergency department if you develop any new or worsening symptoms including severe, worsening abdominal pain, intractable vomiting, or fever over 100.4.

## 2018-12-22 NOTE — ED Triage Notes (Signed)
Patient here from home with complaints of upper mid abd pain that started yesterday. Denies n/v. Hx of same.

## 2018-12-23 ENCOUNTER — Encounter (HOSPITAL_COMMUNITY): Payer: Self-pay

## 2018-12-23 ENCOUNTER — Other Ambulatory Visit: Payer: Self-pay

## 2018-12-23 ENCOUNTER — Emergency Department (HOSPITAL_COMMUNITY)
Admission: EM | Admit: 2018-12-23 | Discharge: 2018-12-23 | Disposition: A | Discharge status: Home / Self Care | Payer: Self-pay | Attending: Emergency Medicine | Admitting: Emergency Medicine

## 2018-12-23 DIAGNOSIS — I10 Essential (primary) hypertension: Secondary | ICD-10-CM | POA: Insufficient documentation

## 2018-12-23 DIAGNOSIS — Z87891 Personal history of nicotine dependence: Secondary | ICD-10-CM | POA: Insufficient documentation

## 2018-12-23 DIAGNOSIS — R42 Dizziness and giddiness: Secondary | ICD-10-CM | POA: Insufficient documentation

## 2018-12-23 DIAGNOSIS — Z79899 Other long term (current) drug therapy: Secondary | ICD-10-CM | POA: Insufficient documentation

## 2018-12-23 LAB — CBG MONITORING, ED: Glucose-Capillary: 98 mg/dL (ref 70–99)

## 2018-12-23 NOTE — Discharge Instructions (Addendum)
Discontinue to Tessalon.

## 2018-12-23 NOTE — ED Provider Notes (Signed)
[facility]-EMERGENCY DEPT Provider Note   CSN: [id number] Arrival date & time: 12/23/18  0859    History   Chief Complaint Chief Complaint  Patient presents with  . Dizziness    HPI Wesley [name] is a 53 y.o. male.     53 year old male presents with complaint of feeling dizzy after taking Tessalon.  Denies any falls or injuries, states he has a mild cough currently that he is taking the Tessalon for.  Dizziness is described as feeling lightheaded, not positional.  No other complaints or concerns.  Past history includes alcohol abuse, anxiety, cocaine abuse, high cholesterol, hypertension, prediabetes.      Past Medical History:  Diagnosis Date  . Alcohol abuse   . Anxiety   . Anxiety   . Chronic neck pain   . Cocaine abuse (HCC)   . Dizziness   . Hypercholesteremia   . Hypertension   . Prediabetes     Patient Active Problem List   Diagnosis Date Noted  . Legally blind in right eye, as defined in [place] 05/02/2017  . Prediabetes 04/23/2017  . Muscle spasm 04/23/2017  . Essential hypertension 04/16/2017  . H/O medication noncompliance 04/02/2017  . Vitamin D deficiency 04/02/2017  . DJD (degenerative joint disease) of cervical spine 03/16/2017  . 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 Dizziness 01/23/2017  . Anxiety 11/21/2016    Past Surgical History:  Procedure Laterality Date  . EYE SURGERY Right ~ 1980   "hit my eye"        Home Medications    Prior to Admission medications   Medication Sig Start Date End Date Taking? Authorizing Provider  acetaminophen (TYLENOL) 500 MG tablet Take 1 tablet (500 mg total) by mouth every 6 (six) hours as needed. 12/19/18   Wallis Bamberg, PA-C  amLODipine (NORVASC) 10 MG tablet Take 1 tablet (10 mg total) by mouth daily. 10/17/18 01/15/19  Mardella Layman, MD  benzonatate (TESSALON) 100 MG capsule Take 1 capsule (100 mg total) by mouth every 8 (eight) hours. 12/21/18   Wurst, [place], PA-C  cyclobenzaprine (FLEXERIL) 5 MG  tablet Take 1-2 tablets (5-10 mg total) by mouth 2 (two) times daily as needed for muscle spasms. 08/18/18   Lurene Shadow, PA-C  lisinopril (PRINIVIL,ZESTRIL) 5 MG tablet Take 1 tablet (5 mg total) by mouth daily. 12/15/18   Elvina Sidle, MD  meclizine (ANTIVERT) 12.5 MG tablet Take 1 tablet (12.5 mg total) by mouth 3 (three) times daily as needed for dizziness. 11/05/18   Mardella Layman, MD  omeprazole (PRILOSEC) 40 MG capsule Take 1 capsule (40 mg total) by mouth daily. 12/19/18   McDonald, Mia A, PA-C  sucralfate (CARAFATE) 1 GM/10ML suspension Take 10 mLs (1 g total) by mouth 4 (four) times daily -  with meals and at bedtime. 12/22/18   Emi Holes, PA-C    Family History Family History  Problem Relation Age of Onset  . Hypertension Mother   . Hypertension Father     Social History Social History   Tobacco Use  . Smoking status: Former Games developer  . Smokeless tobacco: Never Used  Substance Use Topics  . Alcohol use: Not Currently    Comment: former  . Drug use: Not Currently    Comment: denies, reports past history      Allergies   Ibuprofen; Other; and Zofran [ondansetron hcl]   Review of Systems Review of Systems  Constitutional: Negative for fever.  HENT: Negative for congestion.   Eyes: Negative for photophobia  and visual disturbance.       Blind in right eye, prior eye surgery   Respiratory: Positive for cough. Negative for shortness of breath.   Cardiovascular: Negative for chest pain.  Gastrointestinal: Negative for abdominal pain, nausea and vomiting.  Musculoskeletal: Negative for arthralgias, gait problem and myalgias.  Skin: Negative for rash and wound.  Allergic/Immunologic: Negative for immunocompromised state.  Neurological: Positive for dizziness. Negative for facial asymmetry, speech difficulty, weakness and headaches.  Psychiatric/Behavioral: Negative for confusion.  All other systems reviewed and are negative.    Physical Exam Updated Vital  Signs BP (!) 142/106   Pulse 99   Temp 98.2 F (36.8 C) (Oral)   Resp 16   Ht 5\' 6"  (1.676 m)   Wt 93 kg   SpO2 96%   BMI 33.09 kg/m   Physical Exam Vitals signs and nursing note reviewed.  Constitutional:      General: He is not in acute distress.    Appearance: He is well-developed. He is not diaphoretic.  HENT:     Head: Normocephalic and atraumatic.     Right Ear: Tympanic membrane and ear canal normal.     Left Ear: Tympanic membrane and ear canal normal.     Nose: Nose normal.     Mouth/Throat:     Mouth: Mucous membranes are moist.  Eyes:     Extraocular Movements: Extraocular movements intact.     Conjunctiva/sclera: Conjunctivae normal.  Neck:     Musculoskeletal: Neck supple.  Cardiovascular:     Rate and Rhythm: Normal rate and regular rhythm.     Pulses: Normal pulses.     Heart sounds: Normal heart sounds.  Pulmonary:     Effort: Pulmonary effort is normal.     Breath sounds: Normal breath sounds.  Skin:    General: Skin is warm and dry.     Findings: No erythema or rash.  Neurological:     Mental Status: He is alert and oriented to person, place, and time.     GCS: GCS eye subscore is 4. GCS verbal subscore is 5. GCS motor subscore is 6.     Cranial Nerves: Cranial nerves are intact.     Sensory: Sensation is intact.     Motor: Motor function is intact.     Coordination: Coordination is intact.     Gait: Gait is intact.     Comments: Right eye with irregular pupil due to prior eye trauma/surgery, blind  Psychiatric:        Behavior: Behavior normal.      ED Treatments / Results  Labs (all labs ordered are listed, but only abnormal results are displayed) Labs Reviewed  CBG MONITORING, ED    EKG None  Radiology No results found.  Procedures Procedures (including critical care time)  Medications Ordered in ED Medications - No data to display   Initial Impression / Assessment and Plan / ED Course  I have reviewed the triage vital  signs and the nursing notes.  Pertinent labs & imaging results that were available during my care of the patient were reviewed by me and considered in my medical decision making (see chart for details).  Clinical Course as of Dec 23 942  Mon Dec 23, 2018  [id number] 53 year old male presents with complaint of dizziness after taking Tessalon today which she is taking as prescribed for cough.  Exam is otherwise unremarkable, dizziness not reproduced with changes in position, described as feeling lightheaded.  Patient has  had multiple work-ups within the past week.  Advised patient to discontinue the Tessalon and follow-up with his doctor.   [LM]    Clinical Course User Index [LM] Jeannie Fend, PA-C      Final Clinical Impressions(s) / ED Diagnoses   Final diagnoses:  Dizziness    ED Discharge Orders    None       Jeannie Fend, PA-C 12/23/18 0350    Tegeler, Canary Brim, MD 12/23/18 [phone]

## 2018-12-23 NOTE — ED Notes (Signed)
Discharge instructions reviewed with patient. Patient verbalizes understanding. VSS.   

## 2018-12-23 NOTE — ED Triage Notes (Signed)
Patient presents with dizziness x1 hour. Patient reports he was prescribed medication for a cough yesterday. Patient reports after he took the medication this morning, he got very dizzy. Patient denies nausea/vomiting.

## 2018-12-27 MED FILL — CARAFATE 1 GM/10 ML SUSP: 1 | 10 days supply | Qty: 420 | Fill #0

## 2018-12-29 ENCOUNTER — Other Ambulatory Visit: Payer: Self-pay

## 2018-12-29 ENCOUNTER — Ambulatory Visit (HOSPITAL_COMMUNITY)
Admission: EM | Admit: 2018-12-29 | Discharge: 2018-12-29 | Disposition: A | Discharge status: Home / Self Care | Payer: Self-pay | Attending: Urgent Care | Admitting: Urgent Care

## 2018-12-29 ENCOUNTER — Encounter (HOSPITAL_COMMUNITY): Payer: Self-pay | Admitting: Emergency Medicine

## 2018-12-29 DIAGNOSIS — R42 Dizziness and giddiness: Secondary | ICD-10-CM

## 2018-12-29 DIAGNOSIS — F411 Generalized anxiety disorder: Secondary | ICD-10-CM

## 2018-12-29 DIAGNOSIS — R000 Tachycardia, unspecified: Secondary | ICD-10-CM

## 2018-12-29 DIAGNOSIS — I10 Essential (primary) hypertension: Secondary | ICD-10-CM

## 2018-12-29 LAB — POCT URINALYSIS DIP (DEVICE)
Bilirubin Urine: NEGATIVE
Glucose, UA: NEGATIVE mg/dL
Hgb urine dipstick: NEGATIVE
Ketones, ur: NEGATIVE mg/dL
Leukocytes,Ua: NEGATIVE
Nitrite: NEGATIVE
Protein, ur: NEGATIVE mg/dL
Specific Gravity, Urine: <=1.005 (ref 1.005–1.030)
Urobilinogen, UA: 0.2 mg/dL (ref 0.0–1.0)
pH: 7 (ref 5.0–8.0)

## 2018-12-29 LAB — GLUCOSE, CAPILLARY: Glucose-Capillary: 96 mg/dL (ref 70–99)

## 2018-12-29 NOTE — ED Provider Notes (Signed)
MRN: [id number] DOB: 01-15-1966  Subjective:   Wesley [name] is a 53 y.o. male presenting for mild-moderate intermittent dizziness that started a couple of hours ago.  Patient admits that he has not taken his blood pressure medications for about 6 days and restarted them today.  He is hydrating well but is very worried about his symptoms and wants to have his blood sugar checked.  He reports that he felt shortness of breath today when he got dizzy and wants to make sure that he does not have a lung problem.  Patient is a former smoker, does not smoke cigarettes currently.  No current facility-administered medications for this encounter.   Current Outpatient Medications:  .  acetaminophen (TYLENOL) 500 MG tablet, Take 1 tablet (500 mg total) by mouth every 6 (six) hours as needed., Disp: 30 tablet, Rfl: 0 .  amLODipine (NORVASC) 10 MG tablet, Take 1 tablet (10 mg total) by mouth daily., Disp: 90 tablet, Rfl: 0 .  benzonatate (TESSALON) 100 MG capsule, Take 1 capsule (100 mg total) by mouth every 8 (eight) hours., Disp: 21 capsule, Rfl: 0 .  cyclobenzaprine (FLEXERIL) 5 MG tablet, Take 1-2 tablets (5-10 mg total) by mouth 2 (two) times daily as needed for muscle spasms., Disp: 30 tablet, Rfl: 0 .  lisinopril (PRINIVIL,ZESTRIL) 5 MG tablet, Take 1 tablet (5 mg total) by mouth daily., Disp: 90 tablet, Rfl: 3 .  meclizine (ANTIVERT) 12.5 MG tablet, Take 1 tablet (12.5 mg total) by mouth 3 (three) times daily as needed for dizziness., Disp: 30 tablet, Rfl: 0 .  omeprazole (PRILOSEC) 40 MG capsule, Take 1 capsule (40 mg total) by mouth daily., Disp: 90 capsule, Rfl: 1 .  sucralfate (CARAFATE) 1 GM/10ML suspension, Take 10 mLs (1 g total) by mouth 4 (four) times daily -  with meals and at bedtime., Disp: 420 mL, Rfl: 0   Allergies  Allergen Reactions  . Ibuprofen Other (See Comments) and Rash    Makes his throat get "very dry" after taking it States he cant take it  . Other Other (See Comments)   Steroids: Patient received a steroid shot from [facility] (336) [phone] and it resulted in his muscles twitching all over his body  . Zofran [Ondansetron Hcl] Other (See Comments)    Causes sweating and body "feels badly"    Past Medical History:  Diagnosis Date  . Alcohol abuse   . Anxiety   . Anxiety   . Chronic neck pain   . Cocaine abuse (HCC)   . Dizziness   . Hypercholesteremia   . Hypertension   . Prediabetes      Past Surgical History:  Procedure Laterality Date  . EYE SURGERY Right ~ 1980   "hit my eye"    ROS Denies fever, confusion, headache, throat pain, chest pain, wheezing, nausea, vomiting, belly pain, rashes.  Objective:   Vitals: BP 124/86 (BP Location: Right Arm)   Pulse (!) 120   Temp 97.9 F (36.6 C) (Oral)   Resp 19   SpO2 97%   Pulse was 104bpm on recheck by PA-Mani.  Physical Exam Constitutional:      General: He is not in acute distress.    Appearance: Normal appearance. He is well-developed. He is not ill-appearing, toxic-appearing or diaphoretic.  HENT:     Head: Normocephalic and atraumatic.     Right Ear: External ear normal.     Left Ear: External ear normal.     Nose: Nose normal.  Mouth/Throat:     Mouth: Mucous membranes are moist.     Pharynx: Oropharynx is clear.  Eyes:     General: No scleral icterus.    Extraocular Movements: Extraocular movements intact.     Pupils: Pupils are equal, round, and reactive to light.  Cardiovascular:     Rate and Rhythm: Normal rate and regular rhythm.     Heart sounds: Normal heart sounds. No murmur. No friction rub. No gallop.   Pulmonary:     Effort: Pulmonary effort is normal. No respiratory distress.     Breath sounds: Normal breath sounds. No stridor. No wheezing, rhonchi or rales.  Neurological:     General: No focal deficit present.     Mental Status: He is alert and oriented to person, place, and time.     Cranial Nerves: No cranial nerve deficit.     Sensory: No  sensory deficit.     Motor: No weakness.     Coordination: Coordination normal.     Gait: Gait normal.     Deep Tendon Reflexes: Reflexes normal.  Psychiatric:        Mood and Affect: Mood normal.        Behavior: Behavior normal.        Thought Content: Thought content normal.     Results for orders placed or performed during the hospital encounter of 12/29/18 (from the past 24 hour(s))  POCT urinalysis dip (device)     Status: None   Collection Time: 12/29/18  4:35 PM  Result Value Ref Range   Glucose, UA NEGATIVE NEGATIVE mg/dL   Bilirubin Urine NEGATIVE NEGATIVE   Ketones, ur NEGATIVE NEGATIVE mg/dL   Specific Gravity, Urine <=1.005 1.005 - 1.030   Hgb urine dipstick NEGATIVE NEGATIVE   pH 7.0 5.0 - 8.0   Protein, ur NEGATIVE NEGATIVE mg/dL   Urobilinogen, UA 0.2 0.0 - 1.0 mg/dL   Nitrite NEGATIVE NEGATIVE   Leukocytes,Ua NEGATIVE NEGATIVE  Glucose, capillary     Status: None   Collection Time: 12/29/18  4:46 PM  Result Value Ref Range   Glucose-Capillary 96 70 - 99 mg/dL    Assessment and Plan :   Dizziness  Essential hypertension  Tachycardia  Counseled patient that his dizziness is likely due to him restarting his blood pressure medications today after not taking them for about a week.  Recommended patient be more consistent with his blood pressure medications.  Continue to hydrate well and practices a healthy diet. Counseled patient on potential for adverse effects with medications prescribed today, patient verbalized understanding. ER and return-to-clinic precautions discussed, patient verbalized understanding.    Wallis BambergMani, Mario, [place] JerseyPA-C 12/29/18 1658

## 2018-12-29 NOTE — ED Triage Notes (Addendum)
Dizziness started one hour ago, patient thinks blood pressure is up.  Denies pain  Patient asking for CBG

## 2019-01-15 ENCOUNTER — Telehealth: Payer: Self-pay | Admitting: Gastroenterology

## 2019-01-15 NOTE — Telephone Encounter (Signed)
Disregard the reason on previous msg.   Reason for colonoscopy is: Abd pain // nausea // Colon screening

## 2019-01-15 NOTE — Telephone Encounter (Signed)
Left voicemail to call back and scheduled double ECL need a pre visit(vurtal) in late May for IDA, ABD pain & Fam Hx

## 2019-01-19 ENCOUNTER — Other Ambulatory Visit: Payer: Self-pay

## 2019-01-19 ENCOUNTER — Encounter (HOSPITAL_COMMUNITY): Payer: Self-pay | Admitting: Family Medicine

## 2019-01-19 ENCOUNTER — Ambulatory Visit (HOSPITAL_COMMUNITY)
Admission: EM | Admit: 2019-01-19 | Discharge: 2019-01-19 | Disposition: A | Discharge status: Home / Self Care | Payer: Self-pay | Attending: Family Medicine | Admitting: Family Medicine

## 2019-01-19 DIAGNOSIS — T7840XA Allergy, unspecified, initial encounter: Secondary | ICD-10-CM

## 2019-01-19 DIAGNOSIS — B353 Tinea pedis: Secondary | ICD-10-CM

## 2019-01-19 DIAGNOSIS — R21 Rash and other nonspecific skin eruption: Secondary | ICD-10-CM

## 2019-01-19 MED ORDER — KETOCONAZOLE 2 % EX CREA: 1.0000 "application " | TOPICAL_CREAM | Freq: Two times a day (BID) | CUTANEOUS | 0 refills | Status: DC

## 2019-01-19 MED ORDER — FLUOCINONIDE-E 0.05 % EX CREA: 1.0000 "application " | TOPICAL_CREAM | Freq: Two times a day (BID) | CUTANEOUS | 0 refills | Status: DC

## 2019-01-19 MED ORDER — PREDNISONE 20 MG PO TABS: ORAL_TABLET | ORAL | 0 refills | Status: DC

## 2019-01-19 NOTE — ED Triage Notes (Signed)
PT reports genital rash for 1 month.

## 2019-01-19 NOTE — ED Provider Notes (Signed)
[facility]    CSN: [id number] Arrival date & time: 01/19/19  1618     History   Chief Complaint Chief Complaint  Patient presents with  . Rash    HPI Wesley [name] is a 53 y.o. male.   53 yo frequent [facility] who is complaining of rash today in his groin for the last month or so.  He has a h/o anxiety.  He also c/o athlete's foot between toes.     Past Medical History:  Diagnosis Date  . Alcohol abuse   . Anxiety   . Anxiety   . Chronic neck pain   . Cocaine abuse (HCC)   . Dizziness   . Hypercholesteremia   . Hypertension   . Prediabetes     Patient Active Problem List   Diagnosis Date Noted  . 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 Prediabetes 04/23/2017  . Muscle spasm 04/23/2017  . Essential hypertension 04/16/2017  . H/O medication noncompliance 04/02/2017  . Vitamin D deficiency 04/02/2017  . DJD (degenerative joint disease) of cervical spine 03/16/2017  . 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 Dizziness 01/23/2017  . Anxiety 11/21/2016    Past Surgical History:  Procedure Laterality Date  . EYE SURGERY Right ~ 1980   "hit my eye"       Home Medications    Prior to Admission medications   Medication Sig Start Date End Date Taking? Authorizing Provider  acetaminophen (TYLENOL) 500 MG tablet Take 1 tablet (500 mg total) by mouth every 6 (six) hours as needed. 12/19/18   Wallis BambergMani, Mario, PA-C  amLODipine (NORVASC) 10 MG tablet Take 1 tablet (10 mg total) by mouth daily. 10/17/18 01/15/19  Mardella LaymanHagler, Brian, MD  cyclobenzaprine (FLEXERIL) 5 MG tablet Take 1-2 tablets (5-10 mg total) by mouth 2 (two) times daily as needed for muscle spasms. 08/18/18   Lurene ShadowPhelps, Erin O, PA-C  fluocinonide-emollient (LIDEX-E) 0.05 % cream Apply 1 application topically 2 (two) times daily. 01/19/19   Elvina SidleLauenstein, Dicy Smigel, MD  ketoconazole (NIZORAL) 2 % cream Apply 1 application topically 2 (two) times daily. 01/19/19   Elvina SidleLauenstein, Navjot Loera, MD  lisinopril  (PRINIVIL,ZESTRIL) 5 MG tablet Take 1 tablet (5 mg total) by mouth daily. 12/15/18   Elvina SidleLauenstein, Lailee Hoelzel, MD  meclizine (ANTIVERT) 12.5 MG tablet Take 1 tablet (12.5 mg total) by mouth 3 (three) times daily as needed for dizziness. 11/05/18   Mardella LaymanHagler, Brian, MD  omeprazole (PRILOSEC) 40 MG capsule Take 1 capsule (40 mg total) by mouth daily. 12/19/18   McDonald, Mia A, PA-C  predniSONE (DELTASONE) 20 MG tablet Two daily with food 01/19/19   Elvina SidleLauenstein, Conlan Miceli, MD  sucralfate (CARAFATE) 1 GM/10ML suspension Take 10 mLs (1 g total) by mouth 4 (four) times daily -  with meals and at bedtime. 12/22/18   Emi HolesLaw, Alexandra M, PA-C    Family History Family History  Problem Relation Age of Onset  . Hypertension Mother   . Hypertension Father     Social History Social History   Tobacco Use  . Smoking status: Former Games developermoker  . Smokeless tobacco: Never Used  Substance Use Topics  . Alcohol use: Not Currently    Comment: former  . Drug use: Not Currently    Comment: denies, reports past history      Allergies   Ibuprofen; Other; and Zofran [ondansetron hcl]   Review of Systems Review of Systems   Physical Exam Triage Vital Signs ED Triage Vitals  Enc Vitals Group  BP      Pulse      Resp      Temp      Temp src      SpO2      Weight      Height      Head Circumference      Peak Flow      Pain Score      Pain Loc      Pain Edu?      Excl. in GC?    No data found.  Updated Vital Signs BP 126/79   Pulse 82   Temp 98.6 F (37 C) (Oral)   Resp 16   SpO2 96%    Physical Exam Vitals signs and nursing note reviewed.  Constitutional:      Appearance: Normal appearance. He is obese.  HENT:     Mouth/Throat:     Pharynx: Oropharynx is clear.  Neck:     Musculoskeletal: Normal range of motion and neck supple.  Pulmonary:     Effort: Pulmonary effort is normal.  Musculoskeletal: Normal range of motion.  Skin:    General: Skin is warm and dry.     Findings: Erythema and rash  present.     Comments: Rash in lower midline abdomen corresponding to belt buckle:  Excoriated and hyperpigmented.  Scaly interdigital rash between all toes.  Neurological:     General: No focal deficit present.     Mental Status: He is alert.  Psychiatric:        Mood and Affect: Mood normal.      UC Treatments / Results  Labs (all labs ordered are listed, but only abnormal results are displayed) Labs Reviewed - No data to display  EKG None  Radiology No results found.  Procedures Procedures (including critical care time)  Medications Ordered in UC Medications - No data to display  Initial Impression / Assessment and Plan / UC Course  I have reviewed the triage vital signs and the nursing notes.  Pertinent labs & imaging results that were available during my care of the patient were reviewed by me and considered in my medical decision making (see chart for details).    Final Clinical Impressions(s) / UC Diagnoses   Final diagnoses:  Rash  Allergic reaction, initial encounter  Tinea pedis of both feet   Discharge Instructions   None    ED Prescriptions    Medication Sig Dispense Auth. Provider   fluocinonide-emollient (LIDEX-E) 0.05 % cream Apply 1 application topically 2 (two) times daily. 30 g Elvina Sidle, MD   ketoconazole (NIZORAL) 2 % cream Apply 1 application topically 2 (two) times daily. 30 g Elvina Sidle, MD   predniSONE (DELTASONE) 20 MG tablet Two daily with food 7 tablet Elvina Sidle, MD     Controlled Substance Prescriptions [place] Controlled Substance Registry consulted? Not Applicable   Elvina Sidle, MD 01/19/19 [phone]

## 2019-01-20 ENCOUNTER — Other Ambulatory Visit: Payer: Self-pay

## 2019-01-20 ENCOUNTER — Ambulatory Visit (AMBULATORY_SURGERY_CENTER): Payer: Self-pay | Admitting: *Deleted

## 2019-01-20 VITALS — Ht 66.0 in | Wt 202.0 lb

## 2019-01-20 DIAGNOSIS — Z1211 Encounter for screening for malignant neoplasm of colon: Secondary | ICD-10-CM

## 2019-01-20 DIAGNOSIS — R1013 Epigastric pain: Secondary | ICD-10-CM

## 2019-01-20 DIAGNOSIS — R110 Nausea: Secondary | ICD-10-CM

## 2019-01-20 MED ORDER — PEG-KCL-NACL-NASULF-NA ASC-C 140 G PO SOLR: 1.0000 | ORAL | 0 refills | Status: DC

## 2019-01-20 NOTE — Progress Notes (Signed)
No egg or soy allergy known to patient  No issues with past sedation with any surgeries  or procedures, no intubation problems  No diet pills per patient No home 02 use per patient  No blood thinners per patient  Pt denies issues with constipation  No A fib or A flutter  EMMI video sent to pt's e mail   Pt to pick up instruction packet to included paper to complete and mail back to [facility] with addressed and stamped envelope, Emmi video, copy of consent form to read and not return, and instructions  Ans Plenvu sample - pt verbalized will pick up Tuesday 01-21-2019. PV completed over the phone. Pt encouraged to call with questions or issues  Pt states he has no insurance - will do sample of Plenvu  I had to call pt 4 times during this attempted PV- pt states he is working- he kept talking to other people, was very distracted and appeared to not be lsitening to instructions for Procedures 5-19- pt asked if he could not just come in Thursday for a nurse to go over instructions because he was at work and didn't understand- I scheduled an OV PV for Thursday am at 8 am for [facility] nurse to only go over instructions- Pt advised he has to wear a mask to come in the office- he will need a plenvu sample - pt verbalized understanding that he needs to wear mask, come to 3rd floor to check in and then to 2nd floor for instruction session. N.Agricultural engineer, Production designer, theatre/television/film  aware of pt coming in  - im afraid if we do not sit down and go over these instructions, pt will never do them correctly as again he was severly distracted this morning for PV . Bufford Spikes RN   plenvu sample-  Lot [id number]  Exp 03/2019 as directed

## 2019-01-23 ENCOUNTER — Telehealth: Payer: Self-pay

## 2019-01-23 ENCOUNTER — Other Ambulatory Visit: Payer: Self-pay

## 2019-01-23 NOTE — Telephone Encounter (Signed)
Patient had a Pre-Visit with Hilda Lias on 01/20/19 by phone due to Covid 19.  Patient was having trouble understanding his instructions so he was supposed to come into the office to pick up a prep and I was going to show him how to do his prep. Patient was scheduled for PV 01/23/19 @ 8:00 Am. Patient No Showed. Hilda Lias called him at 8:15 and he said that he was waiting on his ride to come get him. He said that he would call back if he wasn't coming. Patient never called and numerous attempts were made to contact the patient to come in today. No answer and I was not able to leave a message. Procedure is scheduled for 01/28/19. A no show letter will be mailed and procedure will be cancelled per [facility] guidelines.   Janalee Dane, LPN ( PV )

## 2019-01-28 ENCOUNTER — Encounter: Payer: Self-pay | Admitting: Gastroenterology

## 2019-01-30 ENCOUNTER — Emergency Department (HOSPITAL_COMMUNITY)
Admission: EM | Admit: 2019-01-30 | Discharge: 2019-01-30 | Disposition: A | Discharge status: Home / Self Care | Payer: Self-pay | Attending: Emergency Medicine | Admitting: Emergency Medicine

## 2019-01-30 ENCOUNTER — Encounter (HOSPITAL_COMMUNITY): Payer: Self-pay

## 2019-01-30 ENCOUNTER — Other Ambulatory Visit: Payer: Self-pay

## 2019-01-30 DIAGNOSIS — R030 Elevated blood-pressure reading, without diagnosis of hypertension: Secondary | ICD-10-CM

## 2019-01-30 DIAGNOSIS — R110 Nausea: Secondary | ICD-10-CM | POA: Insufficient documentation

## 2019-01-30 DIAGNOSIS — I10 Essential (primary) hypertension: Secondary | ICD-10-CM | POA: Insufficient documentation

## 2019-01-30 DIAGNOSIS — Z87891 Personal history of nicotine dependence: Secondary | ICD-10-CM | POA: Insufficient documentation

## 2019-01-30 DIAGNOSIS — F419 Anxiety disorder, unspecified: Secondary | ICD-10-CM | POA: Insufficient documentation

## 2019-01-30 DIAGNOSIS — F410 Panic disorder [episodic paroxysmal anxiety] without agoraphobia: Secondary | ICD-10-CM

## 2019-01-30 DIAGNOSIS — R42 Dizziness and giddiness: Secondary | ICD-10-CM | POA: Insufficient documentation

## 2019-01-30 MED ORDER — LORAZEPAM 2 MG/ML IJ SOLN
1.0000 mg | Freq: Once | INTRAMUSCULAR | Status: AC
  Administered 2019-01-30: 1 mg via INTRAVENOUS
  Filled 2019-01-30: qty 1

## 2019-01-30 NOTE — ED Triage Notes (Signed)
Pt reports waking from sleep because he felt shaky. Pt states upon waking he checked his blood pressure which was 165 systolic at that time. He reports hx of hypertension and is proscribed amlodipine and lisinopril which he took one hour prior to arrival. Pt notes forgetting to take his blood pressure medication yesterday. Pt reports mild nausea, dizziness, and anxiety.

## 2019-01-30 NOTE — ED Provider Notes (Signed)
[facility] Provider Note   CSN: [id number] Arrival date & time: 01/30/19  2633    History   Chief Complaint Chief Complaint  Patient presents with  . Hypertension    HPI Wesley [name] is a 53 y.o. male.     Patient presents to the ED with a chief complaint of high blood pressure reading and anxiety.  Patient states that when he woke up prior to arrival he realized that he forgot to take his BP meds yesterday.  States that he took his BP and it was high.  This made him very nervous and prompted him to come to the ER for evaluation. He reports feeling slightly dizzy and slightly nauseated.  He states that it is common for him to feel like this when he doesn't take his BP meds.  He denies any CP or SOB.  Denies any other associated symptoms.    The history is provided by the patient. No language interpreter was used.    Past Medical History:  Diagnosis Date  . Alcohol abuse   . Anxiety   . Anxiety   . Arthritis   . Chronic neck pain   . Cocaine abuse ([place])   . Dizziness   . GERD (gastroesophageal reflux disease)   . Hypercholesteremia   . Hypertension   . Prediabetes     Patient Active Problem List   Diagnosis Date Noted  . Legally blind in right eye, as defined in [place] 05/02/2017  . Prediabetes 04/23/2017  . Muscle spasm 04/23/2017  . Essential hypertension 04/16/2017  . H/O medication noncompliance 04/02/2017  . Vitamin D deficiency 04/02/2017  . DJD (degenerative joint disease) of cervical spine 03/16/2017  . 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 Dizziness 01/23/2017  . Anxiety 11/21/2016    Past Surgical History:  Procedure Laterality Date  . EYE SURGERY Right ~ 1980   "hit my eye"        Home Medications    Prior to Admission medications   Medication Sig Start Date End Date Taking? Authorizing Provider  acetaminophen (TYLENOL) 500 MG tablet Take 1 tablet (500 mg total) by mouth every 6 (six) hours as needed. 12/19/18   Jaynee Eagles, PA-C  amLODipine (NORVASC) 10 MG tablet Take 1 tablet (10 mg total) by mouth daily. 10/17/18 01/20/19  Vanessa Kick, MD  cyclobenzaprine (FLEXERIL) 5 MG tablet Take 1-2 tablets (5-10 mg total) by mouth 2 (two) times daily as needed for muscle spasms. 08/18/18   Noe Gens, PA-C  fluocinonide-emollient (LIDEX-E) 0.05 % cream Apply 1 application topically 2 (two) times daily. 01/19/19   Robyn Haber, MD  hydrocortisone cream 1 % APPLY TO AFFECTED AREA TWICE A DAY 08/21/18   [provider]  ketoconazole (NIZORAL) 2 % cream Apply 1 application topically 2 (two) times daily. 01/19/19   Robyn Haber, MD  lisinopril (PRINIVIL,ZESTRIL) 5 MG tablet Take 1 tablet (5 mg total) by mouth daily. 12/15/18   Robyn Haber, MD  LORazepam (ATIVAN) 0.5 MG tablet TAKE 1 TABLET TWICE A DAY AS NEEDED FOR ANXIETY 11/15/18   [provider]  meclizine (ANTIVERT) 12.5 MG tablet Take 1 tablet (12.5 mg total) by mouth 3 (three) times daily as needed for dizziness. 11/05/18   Vanessa Kick, MD  methocarbamol (ROBAXIN) 500 MG tablet Take 500 mg by mouth 2 (two) times daily. for 10 days 08/08/18   [provider]  omeprazole (PRILOSEC) 40 MG capsule Take 1 capsule (40 mg total) by mouth daily.  12/19/18   McDonald, Mia A, PA-C  PEG-KCl-NaCl-NaSulf-Na Asc-C (PLENVU) 140 g SOLR Take 1 kit by mouth as directed. 01/20/19   Armbruster, Carlota Raspberry, MD  predniSONE (DELTASONE) 20 MG tablet Two daily with food 01/19/19   Robyn Haber, MD  promethazine (PHENERGAN) 25 MG tablet Take 25 mg by mouth 2 (two) times daily as needed. 08/21/18   [provider]  sucralfate (CARAFATE) 1 GM/10ML suspension Take 10 mLs (1 g total) by mouth 4 (four) times daily -  with meals and at bedtime. 12/22/18   Frederica Kuster, PA-C    Family History Family History  Problem Relation Age of Onset  . Hypertension Mother   . Hypertension Father   . Colon cancer Neg Hx   . Colon polyps Neg Hx   . Esophageal  cancer Neg Hx   . Rectal cancer Neg Hx   . Stomach cancer Neg Hx     Social History Social History   Tobacco Use  . Smoking status: Former Research scientist (life sciences)  . Smokeless tobacco: Never Used  Substance Use Topics  . Alcohol use: Not Currently    Comment: former  . Drug use: Not Currently    Comment: denies, reports past history      Allergies   Ibuprofen; Other; and Zofran [ondansetron hcl]   Review of Systems Review of Systems  All other systems reviewed and are negative.    Physical Exam Updated Vital Signs BP (!) 172/95 (BP Location: Right Arm)   Pulse (!) 117   Temp 98.6 F (37 C) (Oral)   Resp (!) 22   Ht '5\' 6"'$  (1.676 m)   Wt 90.7 kg   SpO2 100%   BMI 32.28 kg/m   Physical Exam Vitals signs and nursing note reviewed.  Constitutional:      Appearance: He is well-developed.  HENT:     Head: Normocephalic and atraumatic.  Eyes:     Conjunctiva/sclera: Conjunctivae normal.  Neck:     Musculoskeletal: Neck supple.  Cardiovascular:     Rate and Rhythm: Normal rate and regular rhythm.     Heart sounds: No murmur.  Pulmonary:     Effort: Pulmonary effort is normal. No respiratory distress.     Breath sounds: Normal breath sounds.  Abdominal:     Palpations: Abdomen is soft.     Tenderness: There is no abdominal tenderness.  Skin:    General: Skin is warm and dry.  Neurological:     Mental Status: He is alert and oriented to person, place, and time.  Psychiatric:     Comments: anxious      ED Treatments / Results  Labs (all labs ordered are listed, but only abnormal results are displayed) Labs Reviewed - No data to display  EKG EKG Interpretation  Date/Time:  Thursday Jan 30 2019 03:48:23 EDT Ventricular Rate:  123 PR Interval:    QRS Duration: 96 QT Interval:  323 QTC Calculation: 462 R Axis:   -73 Text Interpretation:  Sinus tachycardia Left anterior fascicular block Abnormal R-wave progression, late transition Probable left ventricular  hypertrophy When compared with ECG of 09/30/2018, No significant change was found Confirmed by Delora Fuel ([id number]) on 01/30/2019 3:52:04 AM   Radiology No results found.  Procedures Procedures (including critical care time)  Medications Ordered in ED Medications  LORazepam (ATIVAN) injection 1 mg (has no administration in time range)     Initial Impression / Assessment and Plan / ED Course  I have reviewed the triage  vital signs and the nursing notes.  Pertinent labs & imaging results that were available during my care of the patient were reviewed by me and considered in my medical decision making (see chart for details).       Patient with frequent ED visits for anxiety presents to the ED with CC of hypertension.  He took his blood pressure this evening and it was high, this made him nervous, and prompted him to come to the emergency department.  He did not take his blood pressure medication yesterday.  He states that he took his meds immediately prior to coming to the emergency department.  Has had some lightheadedness/dizziness, but denies any chest pain.  States the symptoms are typical for when his blood pressure is high.  We will continue to monitor and ensure that blood pressures trending down, no chest pain or shortness of breath, doubt ACS.  Vitals:   01/30/19 0430 01/30/19 0445  BP: (!) 137/95 129/90  Pulse: (!) 105 96  Resp: (!) 26 (!) 22  Temp:    SpO2: 97% 99%   BP and HR trending down nicely.    4:54 AM Patient feels better.  DC to home. PCP follow-up.  Final Clinical Impressions(s) / ED Diagnoses   Final diagnoses:  Elevated blood pressure reading  Anxiety attack    ED Discharge Orders    None       Montine Circle, PA-C 41/32/44 0102    Delora Fuel, MD 72/53/66 [phone]

## 2019-02-05 ENCOUNTER — Encounter (HOSPITAL_COMMUNITY): Payer: Self-pay

## 2019-02-05 ENCOUNTER — Other Ambulatory Visit: Payer: Self-pay

## 2019-02-05 ENCOUNTER — Ambulatory Visit (HOSPITAL_COMMUNITY)
Admission: EM | Admit: 2019-02-05 | Discharge: 2019-02-05 | Disposition: A | Discharge status: Home / Self Care | Payer: Self-pay | Attending: Family Medicine | Admitting: Family Medicine

## 2019-02-05 DIAGNOSIS — R42 Dizziness and giddiness: Secondary | ICD-10-CM

## 2019-02-05 LAB — GLUCOSE, CAPILLARY: Glucose-Capillary: 98 mg/dL (ref 70–99)

## 2019-02-05 NOTE — ED Triage Notes (Signed)
Patient presents to Urgent Care with complaints of tingling in his hands and headache since today. Patient reports he thinks his glucose is high.

## 2019-02-06 NOTE — ED Provider Notes (Signed)
[facility]   [id number] 02/05/19 Arrival Time: 1902  ASSESSMENT & PLAN:  1. Episodic lightheadedness    Reassured. Blood sugar WNL.  Follow-up Information    Schedule an appointment as soon as possible for a visit  with Wesley [name], Wesley W, NP.   Specialty:  Nurse Practitioner Contact information: [address] [place] [address] [phone]           Reviewed expectations re: course of current medical issues. Questions answered. Outlined signs and symptoms indicating need for more acute intervention. Patient verbalized understanding. After Visit Summary given.   SUBJECTIVE: History from: patient. Wesley [name] is a 53 y.o. male who presents with recurrent complaint of intermittent lightheadedness with occasional "tingling" in extremities. Has been seen here/ED multiple times for the same. Admits much anxiety of recurrent feelings. Desires blood sugar check. Last ED note reviewed. Otherwise well. No CP/SOB. No extremity sensation changes or weakness. No current symptoms. Only felt this morning. Ambulatory without difficulty. No new medications.  ROS: As per HPI.   OBJECTIVE:  Vitals:   02/05/19 1957  BP: 128/87  Pulse: 79  Resp: 18  Temp: 98.6 F (37 C)  TempSrc: Oral  SpO2: 99%    General appearance: alert; no distress Eyes: PERRLA; EOMI; conjunctiva normal HENT: normocephalic; atraumatic Lungs: clear to auscultation bilaterally; unlabored Heart: regular rate and rhythm without murmer Extremities: no edema; symmetrical with no gross deformities Skin: warm and dry Neurologic: normal gait; normal symmetric reflexes; CN 2-12 grossly intact; normal extremity strength and sensation Psychological: alert and cooperative; normal mood and affect  Labs: Results for orders placed or performed during the hospital encounter of 02/05/19  Glucose, capillary  Result Value Ref Range   Glucose-Capillary 98 70 - 99 mg/dL   Labs Reviewed  GLUCOSE,  CAPILLARY  CBG MONITORING, ED   Allergies  Allergen Reactions  . Ibuprofen Other (See Comments) and Rash    Makes his throat get "very dry" after taking it States he cant take it  . Other Other (See Comments)    Steroids: Patient received a steroid shot from [facility] [phone] and it resulted in his muscles twitching all over his body  . Zofran [Ondansetron Hcl] Other (See Comments)    Causes sweating and body "feels badly"    Past Medical History:  Diagnosis Date  . Alcohol abuse   . Anxiety   . Anxiety   . Arthritis   . Chronic neck pain   . Cocaine abuse (HCC)   . Dizziness   . GERD (gastroesophageal reflux disease)   . Hypercholesteremia   . Hypertension   . Prediabetes    Social History   Socioeconomic History  . Marital status: Significant Other    Spouse name: Not on file  . Number of children: Not on file  . Years of education: Not on file  . Highest education level: Not on file  Occupational History  . Not on file  Social Needs  . Financial resource strain: Not on file  . Food insecurity:    Worry: Not on file    Inability: Not on file  . Transportation needs:    Medical: Not on file    Non-medical: Not on file  Tobacco Use  . Smoking status: Former Games developermoker  . Smokeless tobacco: Never Used  Substance and Sexual Activity  . Alcohol use: Not Currently    Comment: former  . Drug use: Not Currently    Comment: denies, reports past  history   . Sexual activity: Not on file  Lifestyle  . Physical activity:    Days per week: Not on file    Minutes per session: Not on file  . Stress: Not on file  Relationships  . Social connections:    Talks on phone: Not on file    Gets together: Not on file    Attends religious service: Not on file    Active member of club or organization: Not on file    Attends meetings of clubs or organizations: Not on file    Relationship status: Not on file  . Intimate partner violence:    Fear of current or  ex partner: Not on file    Emotionally abused: Not on file    Physically abused: Not on file    Forced sexual activity: Not on file  Other Topics Concern  . Not on file  Social History Narrative  . Not on file   Family History  Problem Relation Age of Onset  . Hypertension Mother   . Hypertension Father   . Colon cancer Neg Hx   . Colon polyps Neg Hx   . Esophageal cancer Neg Hx   . Rectal cancer Neg Hx   . Stomach cancer Neg Hx    Past Surgical History:  Procedure Laterality Date  . EYE SURGERY Right ~ 1980   "hit my eye"     Wesley Layman, MD 02/06/19 1007

## 2019-02-15 ENCOUNTER — Other Ambulatory Visit: Payer: Self-pay

## 2019-02-15 ENCOUNTER — Emergency Department (HOSPITAL_COMMUNITY)
Admission: EM | Admit: 2019-02-15 | Discharge: 2019-02-15 | Disposition: A | Discharge status: Home / Self Care | Payer: Self-pay | Attending: Emergency Medicine | Admitting: Emergency Medicine

## 2019-02-15 ENCOUNTER — Encounter (HOSPITAL_COMMUNITY): Payer: Self-pay | Admitting: *Deleted

## 2019-02-15 DIAGNOSIS — F419 Anxiety disorder, unspecified: Secondary | ICD-10-CM | POA: Insufficient documentation

## 2019-02-15 DIAGNOSIS — I10 Essential (primary) hypertension: Secondary | ICD-10-CM | POA: Insufficient documentation

## 2019-02-15 DIAGNOSIS — Z87891 Personal history of nicotine dependence: Secondary | ICD-10-CM | POA: Insufficient documentation

## 2019-02-15 DIAGNOSIS — R030 Elevated blood-pressure reading, without diagnosis of hypertension: Secondary | ICD-10-CM

## 2019-02-15 DIAGNOSIS — Z79899 Other long term (current) drug therapy: Secondary | ICD-10-CM | POA: Insufficient documentation

## 2019-02-15 MED ORDER — LORAZEPAM 1 MG PO TABS
0.5000 mg | ORAL_TABLET | Freq: Once | ORAL | Status: AC
  Administered 2019-02-15: 0.5 mg via ORAL
  Filled 2019-02-15: qty 1

## 2019-02-15 NOTE — ED Provider Notes (Signed)
[name] Provider Note   CSN: [id number] Arrival date & time: 02/15/19  0505    History   Chief Complaint Chief Complaint  Patient presents with  . Hypertension    HPI Wesley [name] is a 53 y.o. male.     Patient well-known to the ED.  He presents this morning with elevated blood pressure.  States he takes his lisinopril in the morning and denies any missed doses.  He has not taken his amlodipine for the past several days because "my blood pressure was good at home".  He states he measured his blood pressure this morning it was 974 systolic he decided to come in.  He complains of "muscles moving" to the back of his head, neck and back.  He denies any headache, vision changes, chest pain, shortness of breath.  Denies any focal weakness, numbness or tingling.  Denies any dizziness or lightheadedness.  Also states he is out of his lorazepam which she takes an as-needed basis for anxiety.  Patient became anxious and concerned this morning when he realized his blood pressure was elevated.  He denies any dizziness or lightheadedness.  The history is provided by the patient.  Hypertension  Pertinent negatives include no chest pain, no abdominal pain, no headaches and no shortness of breath.    Past Medical History:  Diagnosis Date  . Alcohol abuse   . Anxiety   . Anxiety   . Arthritis   . Chronic neck pain   . Cocaine abuse ([place])   . Dizziness   . GERD (gastroesophageal reflux disease)   . Hypercholesteremia   . Hypertension   . Prediabetes     Patient Active Problem List   Diagnosis Date Noted  . Legally blind in right eye, as defined in [place] 05/02/2017  . Prediabetes 04/23/2017  . Muscle spasm 04/23/2017  . Essential hypertension 04/16/2017  . H/O medication noncompliance 04/02/2017  . Vitamin D deficiency 04/02/2017  . DJD (degenerative joint disease) of cervical spine 03/16/2017  . 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 Dizziness 01/23/2017   . Anxiety 11/21/2016    Past Surgical History:  Procedure Laterality Date  . EYE SURGERY Right ~ 1980   "hit my eye"        Home Medications    Prior to Admission medications   Medication Sig Start Date End Date Taking? Authorizing Provider  acetaminophen (TYLENOL) 500 MG tablet Take 1 tablet (500 mg total) by mouth every 6 (six) hours as needed. 12/19/18   Jaynee Eagles, PA-C  amLODipine (NORVASC) 10 MG tablet Take 1 tablet (10 mg total) by mouth daily. 10/17/18 01/20/19  Vanessa Kick, MD  cyclobenzaprine (FLEXERIL) 5 MG tablet Take 1-2 tablets (5-10 mg total) by mouth 2 (two) times daily as needed for muscle spasms. 08/18/18   Noe Gens, PA-C  fluocinonide-emollient (LIDEX-E) 0.05 % cream Apply 1 application topically 2 (two) times daily. 01/19/19   Robyn Haber, MD  hydrocortisone cream 1 % APPLY TO AFFECTED AREA TWICE A DAY 08/21/18   [provider]  ketoconazole (NIZORAL) 2 % cream Apply 1 application topically 2 (two) times daily. 01/19/19   Robyn Haber, MD  lisinopril (PRINIVIL,ZESTRIL) 5 MG tablet Take 1 tablet (5 mg total) by mouth daily. 12/15/18   Robyn Haber, MD  LORazepam (ATIVAN) 0.5 MG tablet TAKE 1 TABLET TWICE A DAY AS NEEDED FOR ANXIETY 11/15/18   [provider]  meclizine (ANTIVERT) 12.5 MG tablet Take 1 tablet (12.5 mg total) by  mouth 3 (three) times daily as needed for dizziness. 11/05/18   Vanessa Kick, MD  methocarbamol (ROBAXIN) 500 MG tablet Take 500 mg by mouth 2 (two) times daily. for 10 days 08/08/18   [provider]  omeprazole (PRILOSEC) 40 MG capsule Take 1 capsule (40 mg total) by mouth daily. 12/19/18   McDonald, Mia A, PA-C  PEG-KCl-NaCl-NaSulf-Na Asc-C (PLENVU) 140 g SOLR Take 1 kit by mouth as directed. 01/20/19   Armbruster, Carlota Raspberry, MD  predniSONE (DELTASONE) 20 MG tablet Two daily with food 01/19/19   Robyn Haber, MD  promethazine (PHENERGAN) 25 MG tablet Take 25 mg by mouth 2 (two) times daily as needed.  08/21/18   [provider]  sucralfate (CARAFATE) 1 GM/10ML suspension Take 10 mLs (1 g total) by mouth 4 (four) times daily -  with meals and at bedtime. 12/22/18   Frederica Kuster, PA-C    Family History Family History  Problem Relation Age of Onset  . Hypertension Mother   . Hypertension Father   . Colon cancer Neg Hx   . Colon polyps Neg Hx   . Esophageal cancer Neg Hx   . Rectal cancer Neg Hx   . Stomach cancer Neg Hx     Social History Social History   Tobacco Use  . Smoking status: Former Research scientist (life sciences)  . Smokeless tobacco: Never Used  Substance Use Topics  . Alcohol use: Not Currently    Comment: former  . Drug use: Not Currently    Comment: denies, reports past history      Allergies   Ibuprofen; Other; and Zofran [ondansetron hcl]   Review of Systems Review of Systems  Constitutional: Negative for activity change and appetite change.  HENT: Negative for congestion and rhinorrhea.   Eyes: Negative for photophobia and visual disturbance.  Respiratory: Negative for cough, chest tightness and shortness of breath.   Cardiovascular: Negative for chest pain.  Gastrointestinal: Negative for abdominal pain, nausea and vomiting.  Genitourinary: Negative for dysuria and hematuria.  Musculoskeletal: Negative for arthralgias and myalgias.  Neurological: Negative for dizziness, seizures, weakness, light-headedness, numbness and headaches.    all other systems are negative except as noted in the HPI and PMH.    Physical Exam Updated Vital Signs BP (!) 147/98 (BP Location: Right Arm)   Pulse 95   Temp 98.4 F (36.9 C) (Oral)   Resp 18   Ht '5\' 6"'$  (1.676 m)   Wt 90.7 kg   SpO2 99%   BMI 32.28 kg/m   Physical Exam Vitals signs and nursing note reviewed.  Constitutional:      General: He is not in acute distress.    Appearance: He is well-developed.     Comments: Anxious appearing  HENT:     Head: Normocephalic and atraumatic.     Mouth/Throat:      Pharynx: No oropharyngeal exudate.  Eyes:     Conjunctiva/sclera: Conjunctivae normal.     Pupils: Pupils are equal, round, and reactive to light.  Neck:     Musculoskeletal: Normal range of motion and neck supple.     Comments: No meningismus. Cardiovascular:     Rate and Rhythm: Regular rhythm. Tachycardia present.     Heart sounds: Normal heart sounds. No murmur.  Pulmonary:     Effort: Pulmonary effort is normal. No respiratory distress.     Breath sounds: Normal breath sounds.  Abdominal:     Palpations: Abdomen is soft.     Tenderness: There is no  abdominal tenderness. There is no guarding or rebound.  Musculoskeletal: Normal range of motion.        General: No tenderness.  Skin:    General: Skin is warm.  Neurological:     Mental Status: He is alert and oriented to person, place, and time.     Cranial Nerves: No cranial nerve deficit.     Motor: No abnormal muscle tone.     Coordination: Coordination normal.     Comments: No ataxia on finger to nose bilaterally. No pronator drift. 5/5 strength throughout. CN 2-12 intact.Equal grip strength. Sensation intact.   Psychiatric:        Behavior: Behavior normal.      ED Treatments / Results  Labs (all labs ordered are listed, but only abnormal results are displayed) Labs Reviewed - No data to display  EKG None  Radiology No results found.  Procedures Procedures (including critical care time)  Medications Ordered in ED Medications  LORazepam (ATIVAN) tablet 0.5 mg (has no administration in time range)     Initial Impression / Assessment and Plan / ED Course  I have reviewed the triage vital signs and the nursing notes.  Pertinent labs & imaging results that were available during my care of the patient were reviewed by me and considered in my medical decision making (see chart for details).       Patient with history of hypertension here with elevated blood pressure.  No neurological deficits.  No chest pain  or shortness of breath.  Admits to not taking his amlodipine for the past several days because his blood pressure at home was well controlled by his report.   Narcotic database reviewed.  Patient last filled an Ativan prescription of 60 tablets on November 15, 2018.  EKG is unchanged.  Neurological exam is nonfocal.  Discussed with patient the importance of taking his blood pressure medications as prescribed.  Patient given p.o. Ativan.  He is counseled to take his blood pressure medications as prescribed and not miss any doses.  Advised to keep a record of blood pressure and follow-up with PCP for medication adjustments.  Return to the ED with chest pain, shortness of breath, unilateral weakness, difficulty speaking, difficulty swallowing, any other concerns.  ED ECG REPORT   Date: 02/15/2019  Rate: 89  Rhythm: normal sinus rhythm  QRS Axis: normal  Intervals: normal  ST/T Wave abnormalities: normal  Conduction Disutrbances:none  Narrative Interpretation:   Old EKG Reviewed: unchanged  I have personally reviewed the EKG tracing and agree with the computerized printout as noted.   Final Clinical Impressions(s) / ED Diagnoses   Final diagnoses:  Elevated blood pressure reading    ED Discharge Orders    None       , Annie Main, MD 02/15/19 [phone]

## 2019-02-15 NOTE — ED Triage Notes (Signed)
Patient states he only came to the ED to have his blood pressure taken. No complaints.

## 2019-02-15 NOTE — Discharge Instructions (Signed)
Take your blood pressure medications as prescribed and do not miss doses.  Follow-up with your doctor for further adjustments of your medications.  Return to the ED if you develop chest pain, shortness of breath, unilateral weakness, difficulty speaking, difficulty swallowing or other concerns.

## 2019-03-02 ENCOUNTER — Encounter (HOSPITAL_COMMUNITY): Payer: Self-pay

## 2019-03-02 ENCOUNTER — Ambulatory Visit (HOSPITAL_COMMUNITY)
Admission: EM | Admit: 2019-03-02 | Discharge: 2019-03-02 | Disposition: A | Discharge status: Home / Self Care | Payer: Self-pay | Attending: Family Medicine | Admitting: Family Medicine

## 2019-03-02 ENCOUNTER — Other Ambulatory Visit: Payer: Self-pay

## 2019-03-02 DIAGNOSIS — I10 Essential (primary) hypertension: Secondary | ICD-10-CM

## 2019-03-02 DIAGNOSIS — F411 Generalized anxiety disorder: Secondary | ICD-10-CM

## 2019-03-02 DIAGNOSIS — R42 Dizziness and giddiness: Secondary | ICD-10-CM

## 2019-03-02 DIAGNOSIS — R1013 Epigastric pain: Secondary | ICD-10-CM

## 2019-03-02 MED ORDER — LISINOPRIL 10 MG PO TABS: 10.0000 mg | ORAL_TABLET | Freq: Every day | ORAL | 3 refills | Status: DC

## 2019-03-02 MED ORDER — AMLODIPINE BESYLATE 5 MG PO TABS: 5.0000 mg | ORAL_TABLET | Freq: Every day | ORAL | 3 refills | Status: DC

## 2019-03-02 MED ORDER — FAMOTIDINE 20 MG PO TABS: 20.0000 mg | ORAL_TABLET | Freq: Every day | ORAL | 6 refills | Status: DC

## 2019-03-02 MED ORDER — LORAZEPAM 0.5 MG PO TABS: 0.5000 mg | ORAL_TABLET | Freq: Every day | ORAL | 5 refills | Status: DC | PRN

## 2019-03-02 NOTE — ED Triage Notes (Signed)
Pt C/O elevated blood pressure and Dizziness. Pt states he forgot to take his medication yesterday morning and woke feeling dizziness.

## 2019-03-02 NOTE — ED Provider Notes (Signed)
[facility]    CSN: [id number] Arrival date & time: 03/02/19  1008     History   Chief Complaint Chief Complaint  Patient presents with  . Dizziness  . Hypertension    HPI Wesley [name] is a 53 y.o. male.   Established patient who comes in regularly for hypertension, dizziness and other issues.  Pt C/O elevated blood pressure and Dizziness. Pt states he forgot to take his medication yesterday morning and woke feeling dizziness.      Past Medical History:  Diagnosis Date  . Alcohol abuse   . Anxiety   . Anxiety   . Arthritis   . Chronic neck pain   . Cocaine abuse ([place])   . Dizziness   . GERD (gastroesophageal reflux disease)   . Hypercholesteremia   . Hypertension   . Prediabetes     Patient Active Problem List   Diagnosis Date Noted  . Legally blind in right eye, as defined in [place] 05/02/2017  . Prediabetes 04/23/2017  . Muscle spasm 04/23/2017  . Essential hypertension 04/16/2017  . H/O medication noncompliance 04/02/2017  . Vitamin D deficiency 04/02/2017  . DJD (degenerative joint disease) of cervical spine 03/16/2017  . 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 Dizziness 01/23/2017  . Anxiety 11/21/2016    Past Surgical History:  Procedure Laterality Date  . EYE SURGERY Right ~ 1980   "hit my eye"       Home Medications    Prior to Admission medications   Medication Sig Start Date End Date Taking? Authorizing Provider  acetaminophen (TYLENOL) 500 MG tablet Take 1 tablet (500 mg total) by mouth every 6 (six) hours as needed. 12/19/18   Jaynee Eagles, PA-C  amLODipine (NORVASC) 5 MG tablet Take 1 tablet (5 mg total) by mouth daily. 03/02/19   Robyn Haber, MD  famotidine (PEPCID) 20 MG tablet Take 1 tablet (20 mg total) by mouth daily. 03/02/19   Robyn Haber, MD  fluocinonide-emollient (LIDEX-E) 0.05 % cream Apply 1 application topically 2 (two) times daily. 01/19/19   Robyn Haber, MD  hydrocortisone cream 1 % APPLY TO AFFECTED AREA TWICE A  DAY 08/21/18   [provider]  ketoconazole (NIZORAL) 2 % cream Apply 1 application topically 2 (two) times daily. 01/19/19   Robyn Haber, MD  lisinopril (ZESTRIL) 10 MG tablet Take 1 tablet (10 mg total) by mouth daily. 03/02/19   Robyn Haber, MD  LORazepam (ATIVAN) 0.5 MG tablet Take 1 tablet (0.5 mg total) by mouth daily as needed for anxiety. 03/02/19   Robyn Haber, MD  meclizine (ANTIVERT) 12.5 MG tablet Take 1 tablet (12.5 mg total) by mouth 3 (three) times daily as needed for dizziness. 11/05/18   Vanessa Kick, MD  PEG-KCl-NaCl-NaSulf-Na Asc-C (PLENVU) 140 g SOLR Take 1 kit by mouth as directed. 01/20/19   Armbruster, Carlota Raspberry, MD  promethazine (PHENERGAN) 25 MG tablet Take 25 mg by mouth 2 (two) times daily as needed. 08/21/18   [provider]  omeprazole (PRILOSEC) 40 MG capsule Take 1 capsule (40 mg total) by mouth daily. 12/19/18 03/02/19  McDonald, Mia A, PA-C  sucralfate (CARAFATE) 1 GM/10ML suspension Take 10 mLs (1 g total) by mouth 4 (four) times daily -  with meals and at bedtime. 12/22/18 03/02/19  Frederica Kuster, PA-C    Family History Family History  Problem Relation Age of Onset  . Hypertension Mother   . Hypertension Father   . Colon cancer Neg Hx   . Colon polyps Neg  Hx   . Esophageal cancer Neg Hx   . Rectal cancer Neg Hx   . Stomach cancer Neg Hx     Social History Social History   Tobacco Use  . Smoking status: Former Research scientist (life sciences)  . Smokeless tobacco: Never Used  Substance Use Topics  . Alcohol use: Not Currently    Comment: former  . Drug use: Not Currently    Comment: denies, reports past history      Allergies   Ibuprofen, Other, and Zofran [ondansetron hcl]   Review of Systems Review of Systems  Constitutional: Negative.   HENT: Negative.   Respiratory: Negative.   Gastrointestinal: Positive for abdominal pain.  Musculoskeletal: Negative.   Skin: Negative.   Neurological: Positive for dizziness.   Psychiatric/Behavioral: Positive for agitation.  All other systems reviewed and are negative.    Physical Exam Triage Vital Signs ED Triage Vitals  Enc Vitals Group     BP 03/02/19 1023 (!) 143/97     Pulse Rate 03/02/19 1023 (!) 120     Resp 03/02/19 1023 16     Temp 03/02/19 1023 98.7 F (37.1 C)     Temp Source 03/02/19 1023 Oral     SpO2 03/02/19 1023 97 %     Weight --      Height --      Head Circumference --      Peak Flow --      Pain Score 03/02/19 1028 0     Pain Loc --      Pain Edu? --      Excl. in [place]? --    No data found.  Updated Vital Signs BP (!) 143/97 (BP Location: Left Arm)   Pulse (!) 120   Temp 98.7 F (37.1 C) (Oral)   Resp 16   SpO2 97%    Physical Exam Vitals signs and nursing note reviewed.  Constitutional:      Appearance: Normal appearance. He is obese.  HENT:     Head: Normocephalic.     Right Ear: Tympanic membrane normal.     Left Ear: Tympanic membrane normal.     Mouth/Throat:     Pharynx: Oropharynx is clear.  Eyes:     Conjunctiva/sclera: Conjunctivae normal.  Neck:     Musculoskeletal: Normal range of motion and neck supple.  Cardiovascular:     Rate and Rhythm: Normal rate.     Heart sounds: Normal heart sounds.  Pulmonary:     Effort: Pulmonary effort is normal.     Breath sounds: Normal breath sounds.  Abdominal:     Palpations: Abdomen is soft.  Musculoskeletal: Normal range of motion.  Skin:    General: Skin is warm and dry.  Neurological:     General: No focal deficit present.     Mental Status: He is alert.  Psychiatric:        Mood and Affect: Mood normal.      UC Treatments / Results  Labs (all labs ordered are listed, but only abnormal results are displayed) Labs Reviewed - No data to display  EKG None  Radiology No results found.  Procedures Procedures (including critical care time)  Medications Ordered in UC Medications - No data to display  Initial Impression / Assessment and Plan  / UC Course  I have reviewed the triage vital signs and the nursing notes.  Pertinent labs & imaging results that were available during my care of the patient were reviewed by me and considered  in my medical decision making (see chart for details).    Final Clinical Impressions(s) / UC Diagnoses   Final diagnoses:  Dizziness  Essential hypertension  Anxiety state  Epigastric burning sensation   Discharge Instructions   None    ED Prescriptions    Medication Sig Dispense Auth. Provider   LORazepam (ATIVAN) 0.5 MG tablet Take 1 tablet (0.5 mg total) by mouth daily as needed for anxiety. 30 tablet Robyn Haber, MD   lisinopril (ZESTRIL) 10 MG tablet Take 1 tablet (10 mg total) by mouth daily. 90 tablet Robyn Haber, MD   amLODipine (NORVASC) 5 MG tablet Take 1 tablet (5 mg total) by mouth daily. 90 tablet Robyn Haber, MD   famotidine (PEPCID) 20 MG tablet Take 1 tablet (20 mg total) by mouth daily. 30 tablet Robyn Haber, MD     Controlled Substance Prescriptions [place] Controlled Substance Registry consulted? Not Applicable   Robyn Haber, MD 03/02/19 1041

## 2019-04-01 IMAGING — CT CT HEAD W/O CM
3 series · 15 of 47 positions shown, 18 images · non-contrast
Comparison: Head CT and brain MRI 07/14/2016

CLINICAL DATA: Dizziness.  Vomiting for 1 day.

EXAM:
CT HEAD WITHOUT CONTRAST
TECHNIQUE: Contiguous axial images were obtained from the base of the skull
through the vertex without intravenous contrast.

[Series 3: head 5.0 h30s · axial · 0.44mm/px · z∈[-96,+44]mm · 9 of 34 slices shown, 12 images]
[im 3/34  brain]
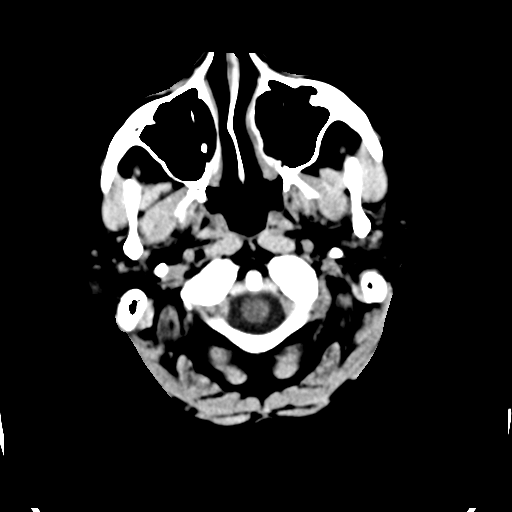
[im 3/34  bone]
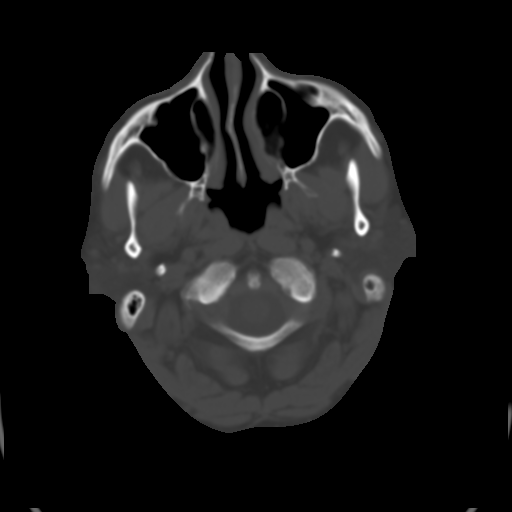
[im 6/34  brain]
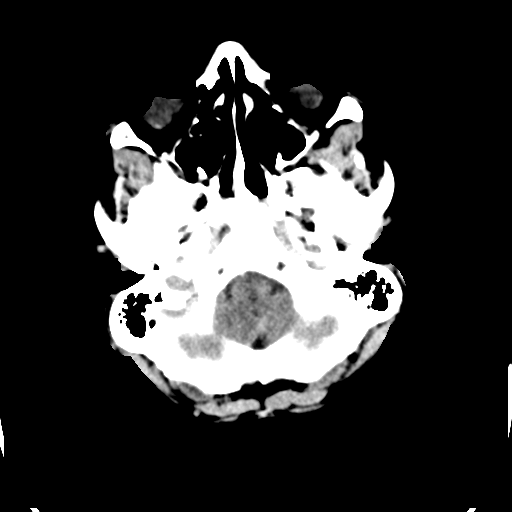
[im 10/34  brain]
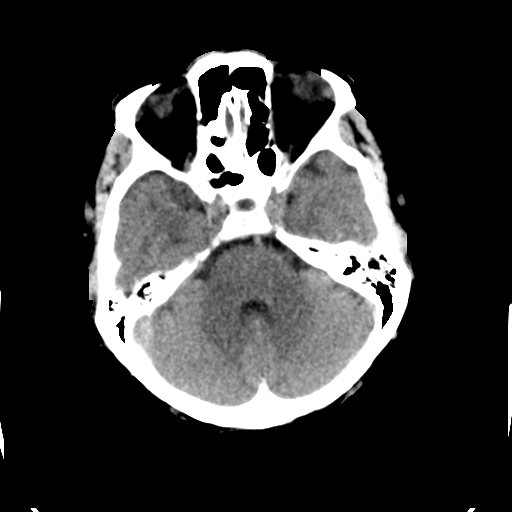
[im 13/34  brain]
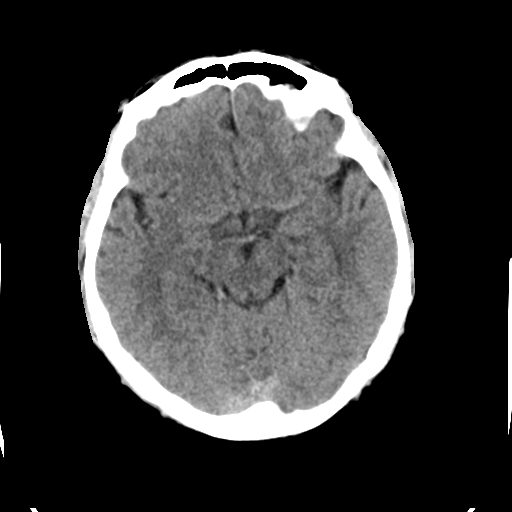
[im 18/34  brain]
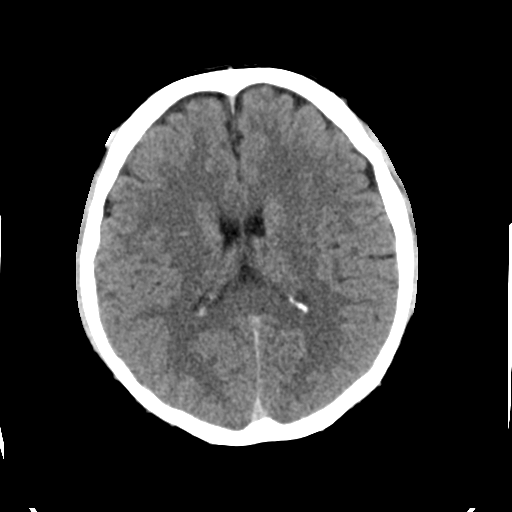
[im 18/34  bone]
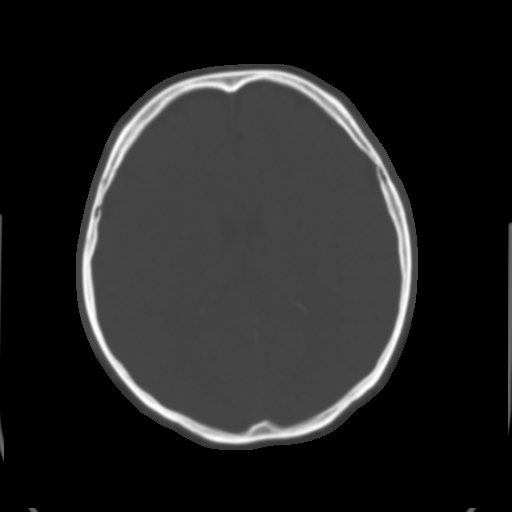
[im 21/34  brain]
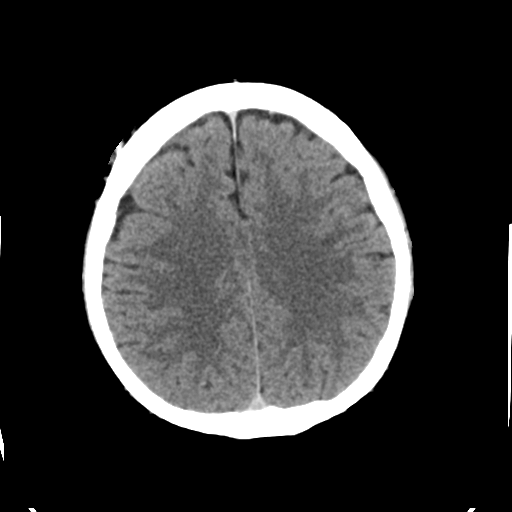
[im 24/34  brain]
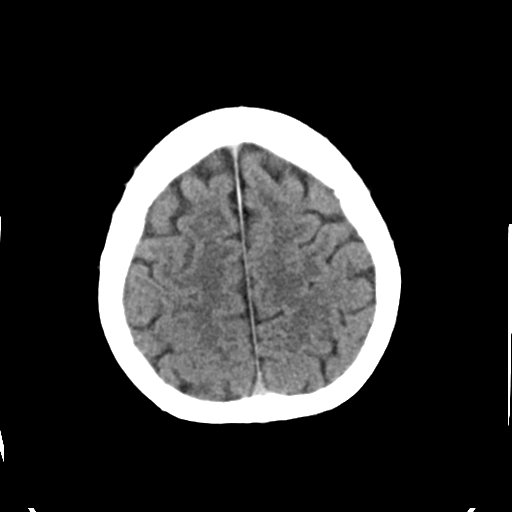
[im 28/34  brain]
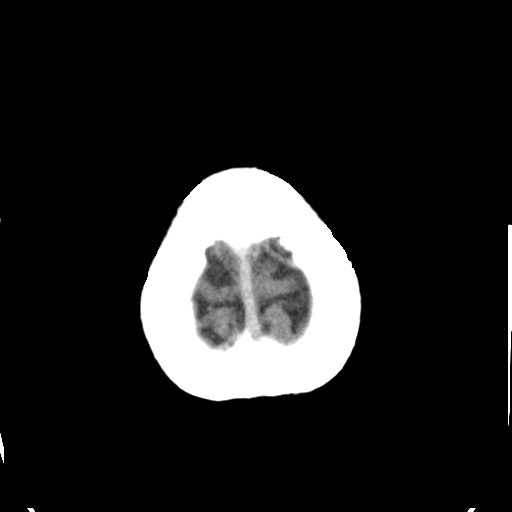
[im 31/34  brain]
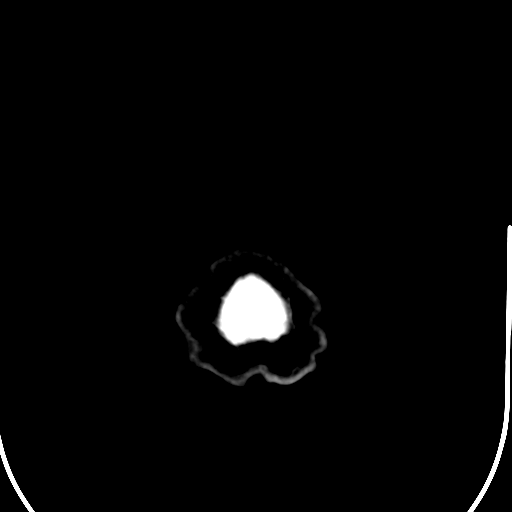
[im 31/34  bone]
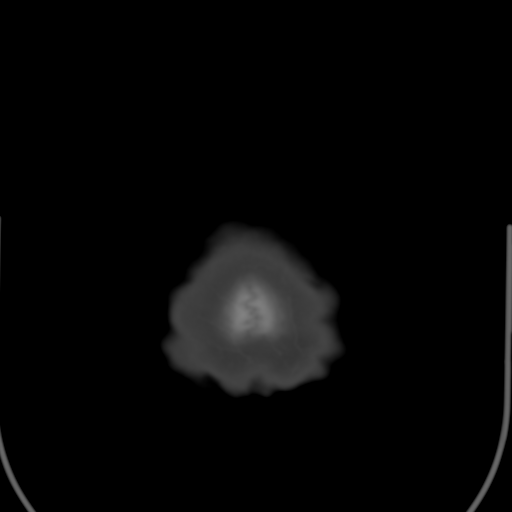

[Series 5: head 3.0 mpr cor · coronal · 0.32mm/px · 3 of 67 slices shown]
[im 23/67  brain]
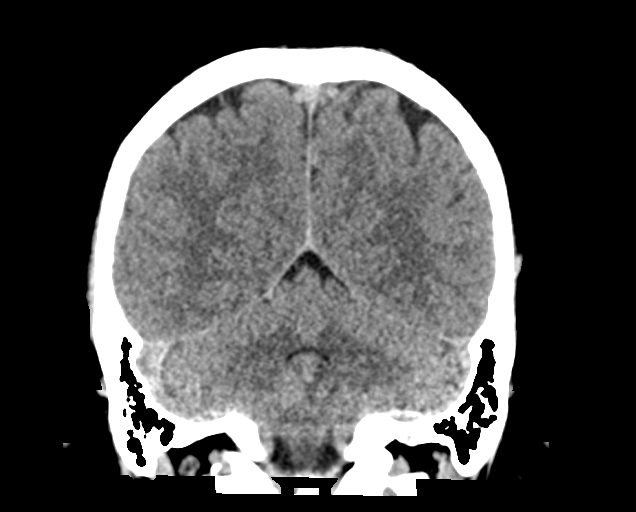
[im 30/67  brain]
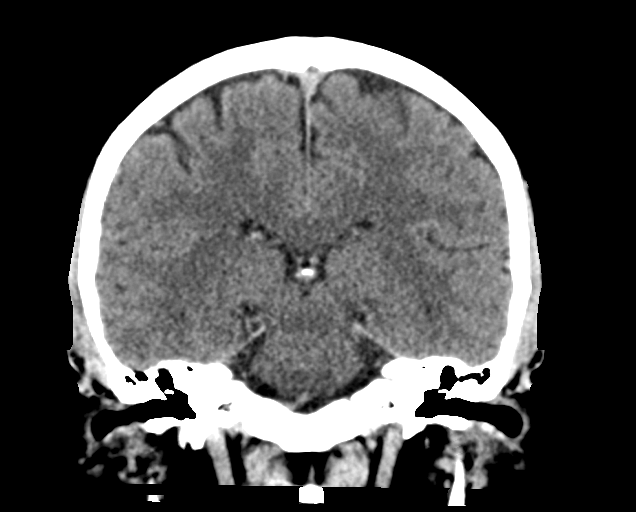
[im 37/67  brain]
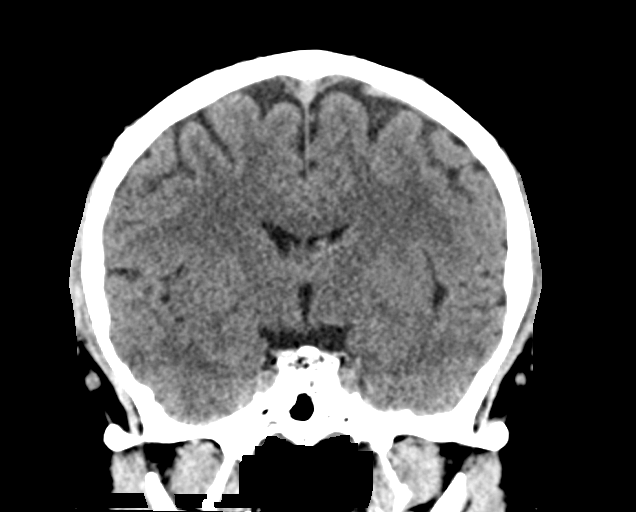

[Series 6: head 3.0 mpr sag · sagittal · 0.32mm/px · 3 of 64 slices shown]
[im 22/64  brain]
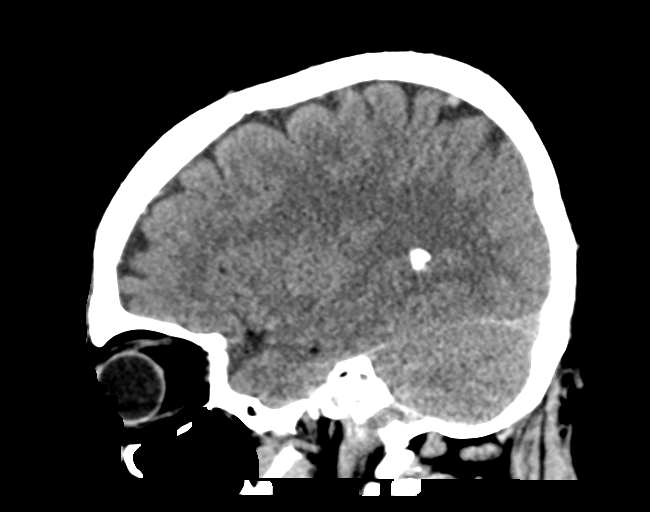
[im 32/64  brain]
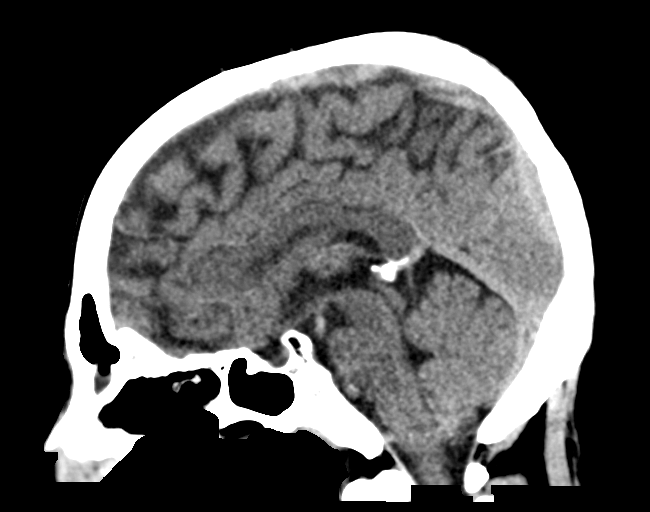
[im 43/64  brain]
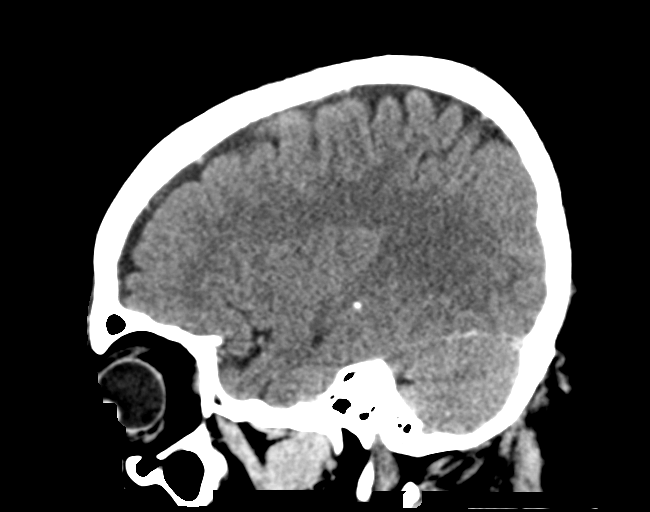

[15 of 47 positions shown; findings below may reference images not displayed]

FINDINGS: Brain: No evidence of acute infarction, hemorrhage, hydrocephalus,
extra-axial collection or mass lesion/mass effect. Encephalomalacia
in the inferior right frontal lobe is stable. Left frontal lobe
changes on MRI are not well visualized on CT.

Vascular: No hyperdense vessel or unexpected calcification.

Skull: No fracture or focal lesion.

Sinuses/Orbits: Mild mucosal thickening of left frontal sinus,
inferior right maxillary sinus and anterior ethmoid air cells. No
fluid levels. Mastoid air cells are well-aerated. Left orbit and
globe are normal. Right lens is not visualized.

Other: None.
IMPRESSION: 1.  No acute intracranial abnormality.
2. Stable inferior right frontal encephalomalacia, pattern typically
seen with remote traumatic brain injury.

## 2019-05-06 ENCOUNTER — Encounter (HOSPITAL_COMMUNITY): Payer: Self-pay | Admitting: Emergency Medicine

## 2019-05-06 ENCOUNTER — Ambulatory Visit (HOSPITAL_COMMUNITY)
Admission: EM | Admit: 2019-05-06 | Discharge: 2019-05-06 | Disposition: A | Discharge status: Home / Self Care | Payer: Self-pay | Attending: Emergency Medicine | Admitting: Emergency Medicine

## 2019-05-06 DIAGNOSIS — F411 Generalized anxiety disorder: Secondary | ICD-10-CM

## 2019-05-06 DIAGNOSIS — L239 Allergic contact dermatitis, unspecified cause: Secondary | ICD-10-CM

## 2019-05-06 MED ORDER — HYDROXYZINE HCL 25 MG PO TABS: 25.0000 mg | ORAL_TABLET | Freq: Four times a day (QID) | ORAL | 0 refills | Status: DC

## 2019-05-06 MED ORDER — CLOTRIMAZOLE-BETAMETHASONE 1-0.05 % EX CREA: TOPICAL_CREAM | CUTANEOUS | 0 refills | Status: DC

## 2019-05-06 NOTE — ED Provider Notes (Signed)
[place]    CSN: [id number] Arrival date & time: 05/06/19  1204      History   Chief Complaint Chief Complaint  Patient presents with  . Rash    HPI Wesley [name] is a 53 y.o. male.   Patient presents with rash on his lower abdomen where his belt buckle touches his skin x several months.  He was seen here on 01/19/2019 for the same rash and states it improved but never completely cleared.  He also states he has anxiety and is out of his lorazepam; he states he has an appointment with his PCP in 2 weeks but requests additional lorazepam today.   Past medical history significant for allergic dermatitis, anxiety, substance abuse, alcohol abuse.  The history is provided by the patient.    Past Medical History:  Diagnosis Date  . Alcohol abuse   . Anxiety   . Anxiety   . Arthritis   . Chronic neck pain   . Cocaine abuse ([place])   . Dizziness   . GERD (gastroesophageal reflux disease)   . Hypercholesteremia   . Hypertension   . Prediabetes     Patient Active Problem List   Diagnosis Date Noted  . Legally blind in right eye, as defined in [place] 05/02/2017  . Prediabetes 04/23/2017  . Muscle spasm 04/23/2017  . Essential hypertension 04/16/2017  . H/O medication noncompliance 04/02/2017  . Vitamin D deficiency 04/02/2017  . DJD (degenerative joint disease) of cervical spine 03/16/2017  . 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 Dizziness 01/23/2017  . Anxiety 11/21/2016    Past Surgical History:  Procedure Laterality Date  . EYE SURGERY Right ~ 1980   "hit my eye"       Home Medications    Prior to Admission medications   Medication Sig Start Date End Date Taking? Authorizing Provider  acetaminophen (TYLENOL) 500 MG tablet Take 1 tablet (500 mg total) by mouth every 6 (six) hours as needed. 12/19/18   Jaynee Eagles, PA-C  amLODipine (NORVASC) 5 MG tablet Take 1 tablet (5 mg total) by mouth daily. 03/02/19   Robyn Haber, MD  clotrimazole-betamethasone (LOTRISONE)  cream Apply to affected area 2 times daily prn 05/06/19   Sharion Balloon, NP  famotidine (PEPCID) 20 MG tablet Take 1 tablet (20 mg total) by mouth daily. 03/02/19   Robyn Haber, MD  fluocinonide-emollient (LIDEX-E) 0.05 % cream Apply 1 application topically 2 (two) times daily. 01/19/19   Robyn Haber, MD  hydrocortisone cream 1 % APPLY TO AFFECTED AREA TWICE A DAY 08/21/18   [provider]  hydrOXYzine (ATARAX/VISTARIL) 25 MG tablet Take 1 tablet (25 mg total) by mouth every 6 (six) hours. 05/06/19   Sharion Balloon, NP  ketoconazole (NIZORAL) 2 % cream Apply 1 application topically 2 (two) times daily. 01/19/19   Robyn Haber, MD  lisinopril (ZESTRIL) 10 MG tablet Take 1 tablet (10 mg total) by mouth daily. 03/02/19   Robyn Haber, MD  LORazepam (ATIVAN) 0.5 MG tablet Take 1 tablet (0.5 mg total) by mouth daily as needed for anxiety. 03/02/19   Robyn Haber, MD  meclizine (ANTIVERT) 12.5 MG tablet Take 1 tablet (12.5 mg total) by mouth 3 (three) times daily as needed for dizziness. 11/05/18   Vanessa Kick, MD  PEG-KCl-NaCl-NaSulf-Na Asc-C (PLENVU) 140 g SOLR Take 1 kit by mouth as directed. 01/20/19   Armbruster, Carlota Raspberry, MD  promethazine (PHENERGAN) 25 MG tablet Take 25 mg by mouth 2 (two) times daily as needed.  08/21/18   [provider]  omeprazole (PRILOSEC) 40 MG capsule Take 1 capsule (40 mg total) by mouth daily. 12/19/18 03/02/19  McDonald, Mia A, PA-C  sucralfate (CARAFATE) 1 GM/10ML suspension Take 10 mLs (1 g total) by mouth 4 (four) times daily -  with meals and at bedtime. 12/22/18 03/02/19  Frederica Kuster, PA-C    Family History Family History  Problem Relation Age of Onset  . Hypertension Mother   . Hypertension Father   . Colon cancer Neg Hx   . Colon polyps Neg Hx   . Esophageal cancer Neg Hx   . Rectal cancer Neg Hx   . Stomach cancer Neg Hx     Social History Social History   Tobacco Use  . Smoking status: Former Research scientist (life sciences)  . Smokeless  tobacco: Never Used  Substance Use Topics  . Alcohol use: Not Currently    Comment: former  . Drug use: Not Currently    Comment: denies, reports past history      Allergies   Ibuprofen, Other, and Zofran [ondansetron hcl]   Review of Systems Review of Systems  Constitutional: Negative for chills and fever.  HENT: Negative for ear pain and sore throat.   Eyes: Negative for pain and visual disturbance.  Respiratory: Negative for cough and shortness of breath.   Cardiovascular: Negative for chest pain and palpitations.  Gastrointestinal: Negative for abdominal pain and vomiting.  Genitourinary: Negative for dysuria and hematuria.  Musculoskeletal: Negative for arthralgias and back pain.  Skin: Positive for rash. Negative for color change.  Neurological: Negative for seizures and syncope.  Psychiatric/Behavioral: The patient is nervous/anxious.   All other systems reviewed and are negative.    Physical Exam Triage Vital Signs ED Triage Vitals  Enc Vitals Group     BP 05/06/19 1235 (!) 133/92     Pulse Rate 05/06/19 1235 93     Resp 05/06/19 1235 16     Temp 05/06/19 1235 98.7 F (37.1 C)     Temp src --      SpO2 05/06/19 1235 99 %     Weight --      Height --      Head Circumference --      Peak Flow --      Pain Score 05/06/19 1237 0     Pain Loc --      Pain Edu? --      Excl. in [place]? --    No data found.  Updated Vital Signs BP (!) 133/92   Pulse 93   Temp 98.7 F (37.1 C)   Resp 16   SpO2 99%   Visual Acuity Right Eye Distance:   Left Eye Distance:   Bilateral Distance:    Right Eye Near:   Left Eye Near:    Bilateral Near:     Physical Exam Vitals signs and nursing note reviewed.  Constitutional:      Appearance: He is well-developed.  HENT:     Head: Normocephalic and atraumatic.  Eyes:     Conjunctiva/sclera: Conjunctivae normal.  Neck:     Musculoskeletal: Neck supple.  Cardiovascular:     Rate and Rhythm: Normal rate and regular  rhythm.     Heart sounds: No murmur.  Pulmonary:     Effort: Pulmonary effort is normal. No respiratory distress.     Breath sounds: Normal breath sounds.  Abdominal:     Palpations: Abdomen is soft.     Tenderness: There is  no abdominal tenderness. There is no guarding or rebound.  Skin:    General: Skin is warm and dry.     Findings: Rash present.     Comments: See picture for details.  Neurological:     General: No focal deficit present.     Mental Status: He is alert and oriented to person, place, and time.     Sensory: No sensory deficit.     Motor: No weakness.        UC Treatments / Results  Labs (all labs ordered are listed, but only abnormal results are displayed) Labs Reviewed - No data to display  EKG   Radiology No results found.  Procedures Procedures (including critical care time)  Medications Ordered in UC Medications - No data to display  Initial Impression / Assessment and Plan / UC Course  I have reviewed the triage vital signs and the nursing notes.  Pertinent labs & imaging results that were available during my care of the patient were reviewed by me and considered in my medical decision making (see chart for details).   Allergic dermatitis.  Anxiety.  Treating with Lotrisone cream.  Instructed patient to stop wearing a metal belt buckle and also the metal button on his jeans.  Discussed with patient that he is apparently allergic to metal touching his skin.  Treating his anxiety with a short course of hydroxyzine.  Explained to patient that we will not refill his lorazepam here and that he needs to see his PCP for this.  Patient agrees to treatment plan.     Final Clinical Impressions(s) / UC Diagnoses   Final diagnoses:  Allergic dermatitis  Anxiety state     Discharge Instructions     Use the prescribed cream on your rash twice a day.  Stop wearing a metal belt buckle.    You can take the prescribed hydroxyzine as needed for your  anxiety until you are able to see your primary care provider in 2 weeks.  Do not drive, operate machinery, or drink alcohol with this medication as it may make you drowsy.        ED Prescriptions    Medication Sig Dispense Auth. Provider   hydrOXYzine (ATARAX/VISTARIL) 25 MG tablet Take 1 tablet (25 mg total) by mouth every 6 (six) hours. 12 tablet Sharion Balloon, NP   clotrimazole-betamethasone (LOTRISONE) cream Apply to affected area 2 times daily prn 15 g Sharion Balloon, NP     Controlled Substance Prescriptions [place] Controlled Substance Registry consulted? Yes, I have consulted the [place] Controlled Substances Registry for this patient, and feel the risk/benefit ratio today is favorable for proceeding with this prescription for a controlled substance.   Sharion Balloon, NP 05/06/19 1331

## 2019-05-06 NOTE — Discharge Instructions (Addendum)
Use the prescribed cream on your rash twice a day.  Stop wearing a metal belt buckle.    You can take the prescribed hydroxyzine as needed for your anxiety until you are able to see your primary care provider in 2 weeks.  Do not drive, operate machinery, or drink alcohol with this medication as it may make you drowsy.

## 2019-05-06 NOTE — ED Triage Notes (Signed)
Pt c/o rash around his belt like x2 months. Pt also states when he eats spicy food he feels pain in his stomach.

## 2019-05-14 ENCOUNTER — Other Ambulatory Visit: Payer: Self-pay

## 2019-05-14 ENCOUNTER — Ambulatory Visit (HOSPITAL_COMMUNITY)
Admission: EM | Admit: 2019-05-14 | Discharge: 2019-05-14 | Disposition: A | Discharge status: Home / Self Care | Payer: Self-pay

## 2019-05-14 ENCOUNTER — Encounter (HOSPITAL_COMMUNITY): Payer: Self-pay

## 2019-05-14 DIAGNOSIS — I10 Essential (primary) hypertension: Secondary | ICD-10-CM

## 2019-05-14 NOTE — ED Triage Notes (Signed)
Patient presents to Urgent Care with complaints of hypertension, tachycardia, and dehydration since earlier today. Patient reports he tried to drink a lot of gatorade so does not know why his heart feels like it is beating so fast, endorses anxiety.

## 2019-05-14 NOTE — Discharge Instructions (Addendum)
Take your blood pressure medicine as directed.    Drink 8 to 10 glasses of water each day to keep yourself hydrated.    Follow-up with your primary care provider in 1 week to discuss your elevated blood pressure and anxiety.

## 2019-05-14 NOTE — ED Provider Notes (Signed)
[place]    CSN: [id number] Arrival date & time: 05/14/19  1836      History   Chief Complaint Chief Complaint  Patient presents with  . Hypertension    HPI Wesley [name] is a 53 y.o. male.   Patient presents with concern for elevated blood pressure and tachycardia today.  He states he drank Gatorade today to keep himself hydrated but feels like he was dehydrated.  He states he is anxious.  Past medical history significant for hypertension, anxiety, substance abuse, and numerous other medical conditions.  The history is provided by the patient.    Past Medical History:  Diagnosis Date  . Alcohol abuse   . Anxiety   . Anxiety   . Arthritis   . Chronic neck pain   . Cocaine abuse ([place])   . Dizziness   . GERD (gastroesophageal reflux disease)   . Hypercholesteremia   . Hypertension   . Prediabetes     Patient Active Problem List   Diagnosis Date Noted  . Legally blind in right eye, as defined in [place] 05/02/2017  . Prediabetes 04/23/2017  . Muscle spasm 04/23/2017  . Essential hypertension 04/16/2017  . H/O medication noncompliance 04/02/2017  . Vitamin D deficiency 04/02/2017  . DJD (degenerative joint disease) of cervical spine 03/16/2017  . 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 Dizziness 01/23/2017  . Anxiety 11/21/2016    Past Surgical History:  Procedure Laterality Date  . EYE SURGERY Right ~ 1980   "hit my eye"       Home Medications    Prior to Admission medications   Medication Sig Start Date End Date Taking? Authorizing Provider  acetaminophen (TYLENOL) 500 MG tablet Take 1 tablet (500 mg total) by mouth every 6 (six) hours as needed. 12/19/18   Jaynee Eagles, PA-C  amLODipine (NORVASC) 5 MG tablet Take 1 tablet (5 mg total) by mouth daily. 03/02/19   Robyn Haber, MD  clotrimazole-betamethasone (LOTRISONE) cream Apply to affected area 2 times daily prn 05/06/19   Sharion Balloon, NP  famotidine (PEPCID) 20 MG tablet Take 1 tablet (20 mg total) by  mouth daily. 03/02/19   Robyn Haber, MD  fluocinonide-emollient (LIDEX-E) 0.05 % cream Apply 1 application topically 2 (two) times daily. 01/19/19   Robyn Haber, MD  hydrocortisone cream 1 % APPLY TO AFFECTED AREA TWICE A DAY 08/21/18   [provider]  hydrOXYzine (ATARAX/VISTARIL) 25 MG tablet Take 1 tablet (25 mg total) by mouth every 6 (six) hours. 05/06/19   Sharion Balloon, NP  ketoconazole (NIZORAL) 2 % cream Apply 1 application topically 2 (two) times daily. 01/19/19   Robyn Haber, MD  lisinopril (ZESTRIL) 10 MG tablet Take 1 tablet (10 mg total) by mouth daily. 03/02/19   Robyn Haber, MD  LORazepam (ATIVAN) 0.5 MG tablet Take 1 tablet (0.5 mg total) by mouth daily as needed for anxiety. 03/02/19   Robyn Haber, MD  meclizine (ANTIVERT) 12.5 MG tablet Take 1 tablet (12.5 mg total) by mouth 3 (three) times daily as needed for dizziness. 11/05/18   Vanessa Kick, MD  PEG-KCl-NaCl-NaSulf-Na Asc-C (PLENVU) 140 g SOLR Take 1 kit by mouth as directed. 01/20/19   Armbruster, Carlota Raspberry, MD  promethazine (PHENERGAN) 25 MG tablet Take 25 mg by mouth 2 (two) times daily as needed. 08/21/18   [provider]  omeprazole (PRILOSEC) 40 MG capsule Take 1 capsule (40 mg total) by mouth daily. 12/19/18 03/02/19  McDonald, Mia A, PA-C  sucralfate (CARAFATE) 1  GM/10ML suspension Take 10 mLs (1 g total) by mouth 4 (four) times daily -  with meals and at bedtime. 12/22/18 03/02/19  Frederica Kuster, PA-C    Family History Family History  Problem Relation Age of Onset  . Hypertension Mother   . Hypertension Father   . Colon cancer Neg Hx   . Colon polyps Neg Hx   . Esophageal cancer Neg Hx   . Rectal cancer Neg Hx   . Stomach cancer Neg Hx     Social History Social History   Tobacco Use  . Smoking status: Former Research scientist (life sciences)  . Smokeless tobacco: Never Used  Substance Use Topics  . Alcohol use: Yes    Comment: socially  . Drug use: Not Currently    Comment: denies, reports  past history      Allergies   Ibuprofen, Other, and Zofran [ondansetron hcl]   Review of Systems Review of Systems  Constitutional: Negative for chills and fever.  HENT: Negative for ear pain and sore throat.   Eyes: Negative for pain and visual disturbance.  Respiratory: Negative for cough and shortness of breath.   Cardiovascular: Negative for chest pain, palpitations and leg swelling.  Gastrointestinal: Negative for abdominal pain and vomiting.  Genitourinary: Negative for dysuria and hematuria.  Musculoskeletal: Negative for arthralgias and back pain.  Skin: Negative for color change and rash.  Neurological: Negative for dizziness, tremors, seizures, syncope, facial asymmetry, speech difficulty, weakness, light-headedness, numbness and headaches.  All other systems reviewed and are negative.    Physical Exam Triage Vital Signs ED Triage Vitals  Enc Vitals Group     BP 05/14/19 1928 (!) 152/103     Pulse Rate 05/14/19 1928 (!) 101     Resp 05/14/19 1928 15     Temp 05/14/19 1928 98.9 F (37.2 C)     Temp Source 05/14/19 1928 Temporal     SpO2 05/14/19 1928 100 %     Weight --      Height --      Head Circumference --      Peak Flow --      Pain Score 05/14/19 1927 0     Pain Loc --      Pain Edu? --      Excl. in [place]? --    No data found.  Updated Vital Signs BP (!) 152/103 (BP Location: Right Arm)   Pulse (!) 101   Temp 98.9 F (37.2 C) (Temporal)   Resp 15   SpO2 100%   Visual Acuity Right Eye Distance:   Left Eye Distance:   Bilateral Distance:    Right Eye Near:   Left Eye Near:    Bilateral Near:     Physical Exam Vitals signs and nursing note reviewed.  Constitutional:      Appearance: He is well-developed.     Comments: Well-appearing.  HENT:     Head: Normocephalic and atraumatic.  Eyes:     Conjunctiva/sclera: Conjunctivae normal.  Neck:     Musculoskeletal: Neck supple.  Cardiovascular:     Rate and Rhythm: Normal rate and regular  rhythm.     Heart sounds: Normal heart sounds. No murmur.  Pulmonary:     Effort: Pulmonary effort is normal. No respiratory distress.     Breath sounds: Normal breath sounds. No wheezing or rhonchi.  Abdominal:     General: Bowel sounds are normal.     Palpations: Abdomen is soft.     Tenderness: There  is no abdominal tenderness. There is no guarding or rebound.  Musculoskeletal:     Right lower leg: No edema.     Left lower leg: No edema.  Skin:    General: Skin is warm and dry.  Neurological:     General: No focal deficit present.     Mental Status: He is alert and oriented to person, place, and time.     Sensory: No sensory deficit.     Motor: No weakness.     Coordination: Coordination normal.     Gait: Gait normal.     Deep Tendon Reflexes: Reflexes normal.      UC Treatments / Results  Labs (all labs ordered are listed, but only abnormal results are displayed) Labs Reviewed - No data to display  EKG   Radiology No results found.  Procedures Procedures (including critical care time)  Medications Ordered in UC Medications - No data to display  Initial Impression / Assessment and Plan / UC Course  I have reviewed the triage vital signs and the nursing notes.  Pertinent labs & imaging results that were available during my care of the patient were reviewed by me and considered in my medical decision making (see chart for details).     Elevated blood pressure with known hypertension.  Instructed patient to take his blood pressure medicine as prescribed and to follow-up with his PCP in 1 week.  Instructed him to stay hydrated with 8 to 10 glasses of water each day.  Discussed with him that he can go to the emergency department if he develops other symptoms such as chest pain, shortness of breath, dizziness, heart palpitations.     Final Clinical Impressions(s) / UC Diagnoses   Final diagnoses:  Elevated blood pressure reading in office with diagnosis of  hypertension     Discharge Instructions     Take your blood pressure medicine as directed.    Drink 8 to 10 glasses of water each day to keep yourself hydrated.    Follow-up with your primary care provider in 1 week to discuss your elevated blood pressure and anxiety.        ED Prescriptions    None     Controlled Substance Prescriptions [place] Controlled Substance Registry consulted? Not Applicable   Sharion Balloon, NP 05/14/19 2036

## 2019-05-24 ENCOUNTER — Encounter (HOSPITAL_COMMUNITY): Payer: Self-pay

## 2019-05-24 ENCOUNTER — Ambulatory Visit (HOSPITAL_COMMUNITY)
Admission: EM | Admit: 2019-05-24 | Discharge: 2019-05-24 | Disposition: A | Discharge status: Home / Self Care | Payer: Self-pay | Attending: Urgent Care | Admitting: Urgent Care

## 2019-05-24 ENCOUNTER — Other Ambulatory Visit: Payer: Self-pay

## 2019-05-24 DIAGNOSIS — I10 Essential (primary) hypertension: Secondary | ICD-10-CM

## 2019-05-24 DIAGNOSIS — R42 Dizziness and giddiness: Secondary | ICD-10-CM

## 2019-05-24 MED ORDER — MECLIZINE HCL 12.5 MG PO TABS: 12.5000 mg | ORAL_TABLET | Freq: Three times a day (TID) | ORAL | 0 refills | Status: DC | PRN

## 2019-05-24 NOTE — ED Triage Notes (Signed)
Pt presents with dizziness since yesterday.  Pt states he forgot to take his blood pressure medication yesterday and took it 2 hours ago (today)

## 2019-05-24 NOTE — ED Provider Notes (Signed)
MRN: [id number] DOB: 12-26-1965  Subjective:   Wesley [name] is a 53 y.o. male presenting for recurrent intermittent dizziness since yesterday.  Patient states that he forgot to take his blood pressure medication the past couple days.  States that he is worried this is affecting him.  Has not tried any medications for relief.  Tries to hydrate well.  No current facility-administered medications for this encounter.   Current Outpatient Medications:  .  acetaminophen (TYLENOL) 500 MG tablet, Take 1 tablet (500 mg total) by mouth every 6 (six) hours as needed., Disp: 30 tablet, Rfl: 0 .  amLODipine (NORVASC) 5 MG tablet, Take 1 tablet (5 mg total) by mouth daily., Disp: 90 tablet, Rfl: 3 .  clotrimazole-betamethasone (LOTRISONE) cream, Apply to affected area 2 times daily prn, Disp: 15 g, Rfl: 0 .  famotidine (PEPCID) 20 MG tablet, Take 1 tablet (20 mg total) by mouth daily., Disp: 30 tablet, Rfl: 6 .  fluocinonide-emollient (LIDEX-E) 0.05 % cream, Apply 1 application topically 2 (two) times daily., Disp: 30 g, Rfl: 0 .  hydrocortisone cream 1 %, APPLY TO AFFECTED AREA TWICE A DAY, Disp: , Rfl:  .  hydrOXYzine (ATARAX/VISTARIL) 25 MG tablet, Take 1 tablet (25 mg total) by mouth every 6 (six) hours., Disp: 12 tablet, Rfl: 0 .  ketoconazole (NIZORAL) 2 % cream, Apply 1 application topically 2 (two) times daily., Disp: 30 g, Rfl: 0 .  lisinopril (ZESTRIL) 10 MG tablet, Take 1 tablet (10 mg total) by mouth daily., Disp: 90 tablet, Rfl: 3 .  LORazepam (ATIVAN) 0.5 MG tablet, Take 1 tablet (0.5 mg total) by mouth daily as needed for anxiety., Disp: 30 tablet, Rfl: 5 .  meclizine (ANTIVERT) 12.5 MG tablet, Take 1 tablet (12.5 mg total) by mouth 3 (three) times daily as needed for dizziness., Disp: 30 tablet, Rfl: 0 .  PEG-KCl-NaCl-NaSulf-Na Asc-C (PLENVU) 140 g SOLR, Take 1 kit by mouth as directed., Disp: 1 each, Rfl: 0 .  promethazine (PHENERGAN) 25 MG tablet, Take 25 mg by mouth 2 (two) times daily as  needed., Disp: , Rfl:    Allergies  Allergen Reactions  . Ibuprofen Other (See Comments) and Rash    Makes his throat get "very dry" after taking it States he cant take it  . Other Other (See Comments)    Steroids: Patient received a steroid shot from [facility] [phone] and it resulted in his muscles twitching all over his body  . Zofran [Ondansetron Hcl] Other (See Comments)    Causes sweating and body "feels badly"    Past Medical History:  Diagnosis Date  . Alcohol abuse   . Anxiety   . Anxiety   . Arthritis   . Chronic neck pain   . Cocaine abuse ([place])   . Dizziness   . GERD (gastroesophageal reflux disease)   . Hypercholesteremia   . Hypertension   . Prediabetes      Past Surgical History:  Procedure Laterality Date  . EYE SURGERY Right ~ 1980   "hit my eye"    ROS Denies headache, confusion, chest pain, heart racing, palpitations, abdominal pain, hematuria, weakness on one side of the body.  Objective:   Vitals: BP (!) 150/91 (BP Location: Left Arm)   Pulse 95   Temp 98.7 F (37.1 C) (Oral)   Resp 17   SpO2 98%   BP recheck was 137/90 by PA Mani at 15:29 over left arm with patient in seated position.  Physical Exam  Constitutional:      General: He is not in acute distress.    Appearance: Normal appearance. He is well-developed. He is not ill-appearing, toxic-appearing or diaphoretic.  HENT:     Head: Normocephalic and atraumatic.     Right Ear: External ear normal.     Left Ear: External ear normal.     Nose: Nose normal.     Mouth/Throat:     Mouth: Mucous membranes are moist.     Pharynx: Oropharynx is clear.  Eyes:     General: No scleral icterus.    Extraocular Movements: Extraocular movements intact.     Pupils: Pupils are equal, round, and reactive to light.  Cardiovascular:     Rate and Rhythm: Normal rate and regular rhythm.     Heart sounds: Normal heart sounds. No murmur. No friction rub. No gallop.   Pulmonary:      Effort: Pulmonary effort is normal. No respiratory distress.     Breath sounds: Normal breath sounds. No stridor. No wheezing, rhonchi or rales.  Neurological:     Mental Status: He is alert and oriented to person, place, and time.     Cranial Nerves: No cranial nerve deficit.     Coordination: Coordination normal.     Deep Tendon Reflexes: Reflexes normal.  Psychiatric:        Mood and Affect: Mood normal.        Behavior: Behavior normal.        Thought Content: Thought content normal.        Judgment: Judgment normal.     Assessment and Plan :   1. Dizziness   2. Essential hypertension   3. Elevated blood pressure reading in office with diagnosis of hypertension     Counseled patient on etiologies for dizziness.  Emphasized need to be compliant with his blood pressure medications as I suspect this is a primary source of his dizziness.  We will have patient take meclizine as needed.  Otherwise physical exam findings very reassuring. Counseled patient on potential for adverse effects with medications prescribed/recommended today, ER and return-to-clinic precautions discussed, patient verbalized understanding.  Follow-up with PCP.    Jaynee Eagles, PA-C 05/24/19 1544

## 2019-05-28 ENCOUNTER — Emergency Department (HOSPITAL_COMMUNITY)
Admission: EM | Admit: 2019-05-28 | Discharge: 2019-05-28 | Disposition: A | Discharge status: Home / Self Care | Payer: Self-pay | Attending: Emergency Medicine | Admitting: Emergency Medicine

## 2019-05-28 ENCOUNTER — Other Ambulatory Visit: Payer: Self-pay

## 2019-05-28 ENCOUNTER — Encounter (HOSPITAL_COMMUNITY): Payer: Self-pay | Admitting: Emergency Medicine

## 2019-05-28 DIAGNOSIS — I10 Essential (primary) hypertension: Secondary | ICD-10-CM | POA: Insufficient documentation

## 2019-05-28 DIAGNOSIS — Z79899 Other long term (current) drug therapy: Secondary | ICD-10-CM | POA: Insufficient documentation

## 2019-05-28 DIAGNOSIS — Z87891 Personal history of nicotine dependence: Secondary | ICD-10-CM | POA: Insufficient documentation

## 2019-05-28 NOTE — ED Triage Notes (Signed)
Pt c/o HTN x 2 days. Denies chest pain/shortness of breath/headache.

## 2019-05-28 NOTE — ED Provider Notes (Signed)
[facility] EMERGENCY DEPARTMENT Provider Note   CSN: [id number] Arrival date & time: 05/28/19  0455     History   Chief Complaint Chief Complaint  Patient presents with  . Hypertension    HPI Wesley [name] is a 53 y.o. male.     The history is provided by the patient and medical records.    53 y.o. F with history of alchol abuse, anxiety, chronic neck pain, cocaine abuse, GERD, HTN, HLP, presenting to the ED with concern of elevated BP.  States he forgot to take this BP medication yesterday morning but just remember early this morning so took it just a few hours ago.  He denies chest pain, dizziness, SOB, etc.  He has been seen by PCP several times for same.  States he is also having some trouble sleeping, unsure why.  He has not tried any OTC sleep aids.  Past Medical History:  Diagnosis Date  . Alcohol abuse   . Anxiety   . Anxiety   . Arthritis   . Chronic neck pain   . Cocaine abuse ([place])   . Dizziness   . GERD (gastroesophageal reflux disease)   . Hypercholesteremia   . Hypertension   . Prediabetes     Patient Active Problem List   Diagnosis Date Noted  . Legally blind in right eye, as defined in [place] 05/02/2017  . Prediabetes 04/23/2017  . Muscle spasm 04/23/2017  . Essential hypertension 04/16/2017  . H/O medication noncompliance 04/02/2017  . Vitamin D deficiency 04/02/2017  . DJD (degenerative joint disease) of cervical spine 03/16/2017  . 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 Dizziness 01/23/2017  . Anxiety 11/21/2016    Past Surgical History:  Procedure Laterality Date  . EYE SURGERY Right ~ 1980   "hit my eye"        Home Medications    Prior to Admission medications   Medication Sig Start Date End Date Taking? Authorizing Provider  acetaminophen (TYLENOL) 500 MG tablet Take 1 tablet (500 mg total) by mouth every 6 (six) hours as needed. 12/19/18   Jaynee Eagles, PA-C  amLODipine (NORVASC) 5 MG tablet Take 1 tablet (5 mg total) by mouth  daily. 03/02/19   Robyn Haber, MD  clotrimazole-betamethasone (LOTRISONE) cream Apply to affected area 2 times daily prn 05/06/19   Sharion Balloon, NP  famotidine (PEPCID) 20 MG tablet Take 1 tablet (20 mg total) by mouth daily. 03/02/19   Robyn Haber, MD  fluocinonide-emollient (LIDEX-E) 0.05 % cream Apply 1 application topically 2 (two) times daily. 01/19/19   Robyn Haber, MD  hydrocortisone cream 1 % APPLY TO AFFECTED AREA TWICE A DAY 08/21/18   [provider]  hydrOXYzine (ATARAX/VISTARIL) 25 MG tablet Take 1 tablet (25 mg total) by mouth every 6 (six) hours. 05/06/19   Sharion Balloon, NP  ketoconazole (NIZORAL) 2 % cream Apply 1 application topically 2 (two) times daily. 01/19/19   Robyn Haber, MD  lisinopril (ZESTRIL) 10 MG tablet Take 1 tablet (10 mg total) by mouth daily. 03/02/19   Robyn Haber, MD  LORazepam (ATIVAN) 0.5 MG tablet Take 1 tablet (0.5 mg total) by mouth daily as needed for anxiety. 03/02/19   Robyn Haber, MD  meclizine (ANTIVERT) 12.5 MG tablet Take 1 tablet (12.5 mg total) by mouth 3 (three) times daily as needed for dizziness. 05/24/19   Jaynee Eagles, PA-C  PEG-KCl-NaCl-NaSulf-Na Asc-C (PLENVU) 140 g SOLR Take 1 kit by mouth as directed. 01/20/19   Armbruster, Carlota Raspberry,  MD  promethazine (PHENERGAN) 25 MG tablet Take 25 mg by mouth 2 (two) times daily as needed. 08/21/18   [provider]  omeprazole (PRILOSEC) 40 MG capsule Take 1 capsule (40 mg total) by mouth daily. 12/19/18 03/02/19  McDonald, Mia A, PA-C  sucralfate (CARAFATE) 1 GM/10ML suspension Take 10 mLs (1 g total) by mouth 4 (four) times daily -  with meals and at bedtime. 12/22/18 03/02/19  Frederica Kuster, PA-C    Family History Family History  Problem Relation Age of Onset  . Hypertension Mother   . Hypertension Father   . Colon cancer Neg Hx   . Colon polyps Neg Hx   . Esophageal cancer Neg Hx   . Rectal cancer Neg Hx   . Stomach cancer Neg Hx     Social History  Social History   Tobacco Use  . Smoking status: Former Research scientist (life sciences)  . Smokeless tobacco: Never Used  Substance Use Topics  . Alcohol use: Yes    Comment: socially  . Drug use: Not Currently    Comment: denies, reports past history      Allergies   Ibuprofen, Other, and Zofran [ondansetron hcl]   Review of Systems Review of Systems  Constitutional:       HTN  All other systems reviewed and are negative.    Physical Exam Updated Vital Signs BP (!) 154/107 (BP Location: Right Arm)   Pulse 96   Temp 97.8 F (36.6 C) (Oral)   Resp 20   SpO2 100%   Physical Exam Vitals signs and nursing note reviewed.  Constitutional:      Appearance: He is well-developed.     Comments: Appears well, mildly anxious  HENT:     Head: Normocephalic and atraumatic.  Eyes:     Conjunctiva/sclera: Conjunctivae normal.     Pupils: Pupils are equal, round, and reactive to light.  Neck:     Musculoskeletal: Normal range of motion.  Cardiovascular:     Rate and Rhythm: Normal rate and regular rhythm.     Heart sounds: Normal heart sounds.  Pulmonary:     Effort: Pulmonary effort is normal.     Breath sounds: Normal breath sounds.  Abdominal:     General: Bowel sounds are normal.     Palpations: Abdomen is soft.  Musculoskeletal: Normal range of motion.  Skin:    General: Skin is warm and dry.  Neurological:     Mental Status: He is alert and oriented to person, place, and time.      ED Treatments / Results  Labs (all labs ordered are listed, but only abnormal results are displayed) Labs Reviewed - No data to display  EKG None  Radiology No results found.  Procedures Procedures (including critical care time)  Medications Ordered in ED Medications - No data to display   Initial Impression / Assessment and Plan / ED Course  I have reviewed the triage vital signs and the nursing notes.  Pertinent labs & imaging results that were available during my care of the patient were  reviewed by me and considered in my medical decision making (see chart for details).  53 year old male here with concern of hypertension.  He is well-known to this facility for same.  He does report he forgot to take his BP medications yesterday morning so took them just a few hours ago.  States he is having some trouble sleeping.  Here, he appears at his baseline.  BP mildly elevated here but  appears around his baseline when compared with prior PCP visits.  Recommended to set an alarm or alternative to help him remember to take his BP meds daily.  Can use OTC melatonin or benadryl for sleep.  He has follow-up with PCP tomorrow morning.  He may return here for any new/acute changes.  Final Clinical Impressions(s) / ED Diagnoses   Final diagnoses:  Essential hypertension    ED Discharge Orders    None       Larene Pickett, PA-C 05/28/19 5521    Merryl Hacker, MD 05/28/19 [phone]

## 2019-05-28 NOTE — Discharge Instructions (Signed)
Can try using over the count melatonin or benadryl to help with sleep. Try to remember to take your medications as prescribed.  Set an alarm to remind yourself if needed. Follow-up with your primary care doctor tomorrow as scheduled. Return here for any new/acute changes.

## 2019-06-19 ENCOUNTER — Other Ambulatory Visit: Payer: Self-pay

## 2019-06-19 ENCOUNTER — Ambulatory Visit (HOSPITAL_COMMUNITY)
Admission: EM | Admit: 2019-06-19 | Discharge: 2019-06-19 | Disposition: A | Discharge status: Home / Self Care | Payer: Self-pay | Attending: Internal Medicine | Admitting: Internal Medicine

## 2019-06-19 ENCOUNTER — Encounter (HOSPITAL_COMMUNITY): Payer: Self-pay | Admitting: Emergency Medicine

## 2019-06-19 DIAGNOSIS — I10 Essential (primary) hypertension: Secondary | ICD-10-CM

## 2019-06-19 DIAGNOSIS — B354 Tinea corporis: Secondary | ICD-10-CM

## 2019-06-19 DIAGNOSIS — R358 Other polyuria: Secondary | ICD-10-CM

## 2019-06-19 LAB — GLUCOSE, CAPILLARY: Glucose-Capillary: 79 mg/dL (ref 70–99)

## 2019-06-19 MED ORDER — TERBINAFINE HCL 250 MG PO TABS: 250.0000 mg | ORAL_TABLET | Freq: Every day | ORAL | 0 refills | Status: DC

## 2019-06-19 MED ORDER — LISINOPRIL 10 MG PO TABS: 10.0000 mg | ORAL_TABLET | Freq: Every day | ORAL | 3 refills | Status: DC

## 2019-06-19 MED ORDER — HYDROXYZINE HCL 25 MG PO TABS: 25.0000 mg | ORAL_TABLET | Freq: Four times a day (QID) | ORAL | 0 refills | Status: DC

## 2019-06-19 MED ORDER — TRIAMCINOLONE ACETONIDE 0.1 % EX CREA: 1.0000 "application " | TOPICAL_CREAM | Freq: Two times a day (BID) | CUTANEOUS | 0 refills | Status: DC

## 2019-06-19 MED ORDER — FAMOTIDINE 20 MG PO TABS: 20.0000 mg | ORAL_TABLET | Freq: Two times a day (BID) | ORAL | 6 refills | Status: DC

## 2019-06-19 NOTE — ED Triage Notes (Addendum)
Patient reports he is out of lisinopril.  Ran out of it yesterday.  Denies pain, but reports feeling dizzy at times  Patient has an appt with doctor next week.  Patient has a rash at lower abdomen that is concerning him

## 2019-06-19 NOTE — ED Provider Notes (Signed)
[facility]    CSN: [id number] Arrival date & time: 06/19/19  1530      History   Chief Complaint Chief Complaint  Patient presents with  . Hypertension  . Medication Refill    HPI Wesley [name] is a 53 y.o. male with a history of illicit drug use, hypertension, hyperlipidemia both controlled comes to urgent care with complaints of lower abdominal rash.  Patient says that the rash has been there for about a month.  He was seen in this urgent care a few weeks ago and given antifungal cream with a steroid.  Patient said the rash has not improved.  Rash continues to be confined to the lower abdominal area in the region of his belt buckle.  Rash is pruritic.  No bruising.  No relieving factors known.  Patient also complains about some blurry vision with polyuria but denies any polydipsia.  No recent weight change.  Appetite remains fair.  No nausea or vomiting.  Patient will like his blood pressure medications-lisinopril-refilled.  HPI  Past Medical History:  Diagnosis Date  . Alcohol abuse   . Anxiety   . Anxiety   . Arthritis   . Chronic neck pain   . Cocaine abuse ([place])   . Dizziness   . GERD (gastroesophageal reflux disease)   . Hypercholesteremia   . Hypertension   . Prediabetes     Patient Active Problem List   Diagnosis Date Noted  . Legally blind in right eye, as defined in [place] 05/02/2017  . Prediabetes 04/23/2017  . Muscle spasm 04/23/2017  . Essential hypertension 04/16/2017  . H/O medication noncompliance 04/02/2017  . Vitamin D deficiency 04/02/2017  . DJD (degenerative joint disease) of cervical spine 03/16/2017  . 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 Dizziness 01/23/2017  . Anxiety 11/21/2016    Past Surgical History:  Procedure Laterality Date  . EYE SURGERY Right ~ 1980   "hit my eye"       Home Medications    Prior to Admission medications   Medication Sig Start Date End Date Taking? Authorizing Provider  acetaminophen (TYLENOL) 500 MG tablet  Take 1 tablet (500 mg total) by mouth every 6 (six) hours as needed. 12/19/18   Jaynee Eagles, PA-C  amLODipine (NORVASC) 5 MG tablet Take 1 tablet (5 mg total) by mouth daily. 03/02/19   Robyn Haber, MD  clotrimazole-betamethasone (LOTRISONE) cream Apply to affected area 2 times daily prn 05/06/19   Sharion Balloon, NP  famotidine (PEPCID) 20 MG tablet Take 1 tablet (20 mg total) by mouth daily. 03/02/19   Robyn Haber, MD  fluocinonide-emollient (LIDEX-E) 0.05 % cream Apply 1 application topically 2 (two) times daily. 01/19/19   Robyn Haber, MD  hydrocortisone cream 1 % APPLY TO AFFECTED AREA TWICE A DAY 08/21/18   [provider]  hydrOXYzine (ATARAX/VISTARIL) 25 MG tablet Take 1 tablet (25 mg total) by mouth every 6 (six) hours. 05/06/19   Sharion Balloon, NP  ketoconazole (NIZORAL) 2 % cream Apply 1 application topically 2 (two) times daily. 01/19/19   Robyn Haber, MD  lisinopril (ZESTRIL) 10 MG tablet Take 1 tablet (10 mg total) by mouth daily. Patient taking differently: Take 10 mg by mouth daily. Patient is out of this medicine 03/02/19   Robyn Haber, MD  LORazepam (ATIVAN) 0.5 MG tablet Take 1 tablet (0.5 mg total) by mouth daily as needed for anxiety. 03/02/19   Robyn Haber, MD  meclizine (ANTIVERT) 12.5 MG tablet Take 1 tablet (12.5 mg  total) by mouth 3 (three) times daily as needed for dizziness. 05/24/19   Jaynee Eagles, PA-C  PEG-KCl-NaCl-NaSulf-Na Asc-C (PLENVU) 140 g SOLR Take 1 kit by mouth as directed. 01/20/19   Armbruster, Carlota Raspberry, MD  promethazine (PHENERGAN) 25 MG tablet Take 25 mg by mouth 2 (two) times daily as needed. 08/21/18   [provider]  omeprazole (PRILOSEC) 40 MG capsule Take 1 capsule (40 mg total) by mouth daily. 12/19/18 03/02/19  McDonald, Mia A, PA-C  sucralfate (CARAFATE) 1 GM/10ML suspension Take 10 mLs (1 g total) by mouth 4 (four) times daily -  with meals and at bedtime. 12/22/18 03/02/19  Frederica Kuster, PA-C    Family History  Family History  Problem Relation Age of Onset  . Hypertension Mother   . Hypertension Father   . Colon cancer Neg Hx   . Colon polyps Neg Hx   . Esophageal cancer Neg Hx   . Rectal cancer Neg Hx   . Stomach cancer Neg Hx     Social History Social History   Tobacco Use  . Smoking status: Former Research scientist (life sciences)  . Smokeless tobacco: Never Used  Substance Use Topics  . Alcohol use: Yes    Comment: socially  . Drug use: Not Currently    Comment: denies, reports past history      Allergies   Ibuprofen, Other, and Zofran [ondansetron hcl]   Review of Systems Review of Systems  Constitutional: Negative.   HENT: Negative.   Respiratory: Negative.   Cardiovascular: Negative.   Gastrointestinal: Negative.   Genitourinary: Negative for discharge, dysuria, frequency, hematuria and urgency.  Musculoskeletal: Negative.   Skin: Positive for rash.  Neurological: Negative.   Psychiatric/Behavioral: Negative.      Physical Exam Triage Vital Signs ED Triage Vitals  Enc Vitals Group     BP 06/19/19 1613 125/90     Pulse Rate 06/19/19 1613 82     Resp 06/19/19 1613 18     Temp 06/19/19 1613 98.4 F (36.9 C)     Temp Source 06/19/19 1613 Oral     SpO2 06/19/19 1613 100 %     Weight --      Height --      Head Circumference --      Peak Flow --      Pain Score 06/19/19 1610 0     Pain Loc --      Pain Edu? --      Excl. in [place]? --    No data found.  Updated Vital Signs BP 125/90 (BP Location: Right Arm)   Pulse 82   Temp 98.4 F (36.9 C) (Oral)   Resp 18   SpO2 100%   Visual Acuity Right Eye Distance:   Left Eye Distance:   Bilateral Distance:    Right Eye Near:   Left Eye Near:    Bilateral Near:     Physical Exam Vitals signs and nursing note reviewed.  Constitutional:      General: He is not in acute distress.    Appearance: He is not ill-appearing.  Cardiovascular:     Rate and Rhythm: Normal rate and regular rhythm.     Pulses: Normal pulses.     Heart  sounds: Normal heart sounds.  Pulmonary:     Effort: Pulmonary effort is normal. No respiratory distress.     Breath sounds: Normal breath sounds. No rhonchi or rales.  Abdominal:     General: Bowel sounds are normal.  Musculoskeletal:  Normal range of motion.  Skin:    Capillary Refill: Capillary refill takes less than 2 seconds.     Findings: Rash present.     Comments: Rash on the anterior abdominal wall in the suprapubic area.  There are significant excoriations.  No significant erythema.  Rash does not look cellulitic.  No discharge or bogginess.  Neurological:     General: No focal deficit present.     Mental Status: He is alert and oriented to person, place, and time.      UC Treatments / Results  Labs (all labs ordered are listed, but only abnormal results are displayed) Labs Reviewed - No data to display  EKG   Radiology No results found.  Procedures Procedures (including critical care time)  Medications Ordered in UC Medications - No data to display  Initial Impression / Assessment and Plan / UC Course  I have reviewed the triage vital signs and the nursing notes.  Pertinent labs & imaging results that were available during my care of the patient were reviewed by me and considered in my medical decision making (see chart for details).     1.  Hypertensive medication refill: Lisinopril has been refilled  2.  Anterior abdominal wall rash consistent with tinea infection: Patient has failed topical antifungal agents Terbinafine 250 mg orally daily for 14 days If it does not improve then this might be related to his belt buckle in that case the patient may have allergic contact dermatitis.  I counseled patient about not wearing a belt with a metal buckle.  He does not believe that the metal is the cause of his symptoms.  3.  Gastroesophageal reflux disease: Famotidine was increased to 20 mg twice daily. Final Clinical Impressions(s) / UC Diagnoses   Final  diagnoses:  None   Discharge Instructions   None    ED Prescriptions    None     PDMP not reviewed this encounter.   Chase Picket, MD 06/19/19 1739

## 2019-07-11 ENCOUNTER — Emergency Department (HOSPITAL_COMMUNITY)
Admission: EM | Admit: 2019-07-11 | Discharge: 2019-07-11 | Disposition: A | Discharge status: Home / Self Care | Payer: Self-pay | Attending: Emergency Medicine | Admitting: Emergency Medicine

## 2019-07-11 ENCOUNTER — Encounter (HOSPITAL_COMMUNITY): Payer: Self-pay | Admitting: Emergency Medicine

## 2019-07-11 ENCOUNTER — Other Ambulatory Visit: Payer: Self-pay

## 2019-07-11 DIAGNOSIS — Z5321 Procedure and treatment not carried out due to patient leaving prior to being seen by health care provider: Secondary | ICD-10-CM | POA: Insufficient documentation

## 2019-07-11 DIAGNOSIS — I10 Essential (primary) hypertension: Secondary | ICD-10-CM | POA: Insufficient documentation

## 2019-07-11 NOTE — ED Notes (Signed)
No pts in lobby and unable to locate mr Nucci

## 2019-07-11 NOTE — ED Notes (Signed)
Called for pt, no response

## 2019-07-11 NOTE — ED Triage Notes (Signed)
Pt returns after leaving to go home and get his medications. States he wants to be seen for his HTN.

## 2019-07-11 NOTE — ED Triage Notes (Signed)
Pt with c/o anxiety and HTN. States he has been taking both his Ativan and Lisinopril. Denies any cp, sob or n/v

## 2019-07-12 ENCOUNTER — Ambulatory Visit (HOSPITAL_COMMUNITY)
Admission: EM | Admit: 2019-07-12 | Discharge: 2019-07-12 | Disposition: A | Discharge status: Home / Self Care | Payer: Self-pay | Attending: Family Medicine | Admitting: Family Medicine

## 2019-07-12 ENCOUNTER — Encounter (HOSPITAL_COMMUNITY): Payer: Self-pay | Admitting: Family Medicine

## 2019-07-12 DIAGNOSIS — I10 Essential (primary) hypertension: Secondary | ICD-10-CM

## 2019-07-12 DIAGNOSIS — F411 Generalized anxiety disorder: Secondary | ICD-10-CM

## 2019-07-12 DIAGNOSIS — E119 Type 2 diabetes mellitus without complications: Secondary | ICD-10-CM

## 2019-07-12 LAB — GLUCOSE, CAPILLARY: Glucose-Capillary: 142 mg/dL — ABNORMAL HIGH (ref 70–99)

## 2019-07-12 MED ORDER — LORAZEPAM 0.5 MG PO TABS: 0.5000 mg | ORAL_TABLET | Freq: Every day | ORAL | 5 refills | Status: DC | PRN

## 2019-07-12 MED ORDER — AMLODIPINE BESYLATE 5 MG PO TABS: 5.0000 mg | ORAL_TABLET | Freq: Every day | ORAL | 3 refills | Status: DC

## 2019-07-12 MED ORDER — LISINOPRIL 10 MG PO TABS: 10.0000 mg | ORAL_TABLET | Freq: Every day | ORAL | 3 refills | Status: DC

## 2019-07-12 NOTE — ED Triage Notes (Addendum)
Pt states having blurred vision on the left eye x 9 days. Pt states he can't see with his right eye, he had an injury 14 years ago.  Pt states he is having uncontrolled hypertension. Pt states having mild headache. Pt wants her blood sugar levels to be check.

## 2019-07-12 NOTE — ED Provider Notes (Addendum)
[facility]    CSN: [id number] Arrival date & time: 07/12/19  1509      History   Chief Complaint Chief Complaint  Patient presents with  . Hypertension  . Blurred Vision  . Headache    HPI Wesley [name] is a 53 y.o. male.   This is a 53 yo Hispanic man who presents here every month or so with minor complaints.  Today, he complains of slightly blurry vision and would like his blood sugar checked.  He says his blood pressure was elevated this morning despite taking his lisinopril 10 mg as usual.  No headache, difficulty speaking or swallowing, no diplopia, no trouble with arms/legs, no change in hearing.  He states that he has chronic anxiety.     Past Medical History:  Diagnosis Date  . Alcohol abuse   . Anxiety   . Anxiety   . Arthritis   . Chronic neck pain   . Cocaine abuse ([place])   . Dizziness   . GERD (gastroesophageal reflux disease)   . Hypercholesteremia   . Hypertension   . Prediabetes     Patient Active Problem List   Diagnosis Date Noted  . Legally blind in right eye, as defined in [place] 05/02/2017  . Prediabetes 04/23/2017  . Muscle spasm 04/23/2017  . Essential hypertension 04/16/2017  . H/O medication noncompliance 04/02/2017  . Vitamin D deficiency 04/02/2017  . DJD (degenerative joint disease) of cervical spine 03/16/2017  . 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 Dizziness 01/23/2017  . Anxiety 11/21/2016    Past Surgical History:  Procedure Laterality Date  . EYE SURGERY Right ~ 1980   "hit my eye"       Home Medications    Prior to Admission medications   Medication Sig Start Date End Date Taking? Authorizing Provider  acetaminophen (TYLENOL) 500 MG tablet Take 1 tablet (500 mg total) by mouth every 6 (six) hours as needed. 12/19/18   Jaynee Eagles, PA-C  amLODipine (NORVASC) 5 MG tablet Take 1 tablet (5 mg total) by mouth daily. 07/12/19   Robyn Haber, MD  famotidine (PEPCID) 20 MG tablet Take 1 tablet (20 mg total) by mouth 2  (two) times daily. 06/19/19   Chase Picket, MD  hydrOXYzine (ATARAX/VISTARIL) 25 MG tablet Take 1 tablet (25 mg total) by mouth every 6 (six) hours. 06/19/19   Lamptey, Myrene Galas, MD  lisinopril (ZESTRIL) 10 MG tablet Take 1 tablet (10 mg total) by mouth daily. 07/12/19   Robyn Haber, MD  LORazepam (ATIVAN) 0.5 MG tablet Take 1 tablet (0.5 mg total) by mouth daily as needed for anxiety. 07/12/19   Robyn Haber, MD  meclizine (ANTIVERT) 12.5 MG tablet Take 1 tablet (12.5 mg total) by mouth 3 (three) times daily as needed for dizziness. 05/24/19   Jaynee Eagles, PA-C  PEG-KCl-NaCl-NaSulf-Na Asc-C (PLENVU) 140 g SOLR Take 1 kit by mouth as directed. 01/20/19   Armbruster, Carlota Raspberry, MD  promethazine (PHENERGAN) 25 MG tablet Take 25 mg by mouth 2 (two) times daily as needed. 08/21/18   [provider]  terbinafine (LAMISIL) 250 MG tablet Take 1 tablet (250 mg total) by mouth daily. 06/19/19   LampteyMyrene Galas, MD  triamcinolone cream (KENALOG) 0.1 % Apply 1 application topically 2 (two) times daily. 06/19/19   Chase Picket, MD  omeprazole (PRILOSEC) 40 MG capsule Take 1 capsule (40 mg total) by mouth daily. 12/19/18 03/02/19  McDonald, Mia A, PA-C  sucralfate (CARAFATE) 1 GM/10ML suspension Take 10  mLs (1 g total) by mouth 4 (four) times daily -  with meals and at bedtime. 12/22/18 03/02/19  Frederica Kuster, PA-C    Family History Family History  Problem Relation Age of Onset  . Hypertension Mother   . Hypertension Father   . Colon cancer Neg Hx   . Colon polyps Neg Hx   . Esophageal cancer Neg Hx   . Rectal cancer Neg Hx   . Stomach cancer Neg Hx     Social History Social History   Tobacco Use  . Smoking status: Former Research scientist (life sciences)  . Smokeless tobacco: Never Used  Substance Use Topics  . Alcohol use: Yes    Comment: socially  . Drug use: Not Currently    Comment: denies, reports past history      Allergies   Ibuprofen, Other, and Zofran [ondansetron hcl]   Review of  Systems Review of Systems  Constitutional: Negative.   HENT: Negative.   Eyes: Positive for visual disturbance.  Respiratory: Negative.   Cardiovascular: Negative.   Gastrointestinal: Negative.   Musculoskeletal: Negative.   Neurological: Negative.   Psychiatric/Behavioral: Positive for agitation.  All other systems reviewed and are negative.    Physical Exam Triage Vital Signs ED Triage Vitals  Enc Vitals Group     BP      Pulse      Resp      Temp      Temp src      SpO2      Weight      Height      Head Circumference      Peak Flow      Pain Score      Pain Loc      Pain Edu?      Excl. in [place]?    No data found.  Updated Vital Signs BP 130/88 (BP Location: Right Arm)   Pulse 87   Temp 98.1 F (36.7 C) (Oral)   Resp 17   SpO2 98%    Physical Exam Vitals signs and nursing note reviewed.  Constitutional:      General: He is not in acute distress.    Appearance: He is well-developed. He is obese. He is not ill-appearing or toxic-appearing.  HENT:     Head: Normocephalic.     Mouth/Throat:     Mouth: Mucous membranes are moist.     Pharynx: Oropharynx is clear.  Eyes:     General: No scleral icterus.    Extraocular Movements: Extraocular movements intact.     Right eye: Normal extraocular motion and no nystagmus.     Left eye: Normal extraocular motion and no nystagmus.     Pupils: Pupils are equal, round, and reactive to light. Pupils are equal.  Neck:     Musculoskeletal: Normal range of motion and neck supple.  Cardiovascular:     Rate and Rhythm: Normal rate and regular rhythm.     Heart sounds: Normal heart sounds.  Pulmonary:     Effort: Pulmonary effort is normal.     Breath sounds: Normal breath sounds.  Musculoskeletal: Normal range of motion.  Skin:    General: Skin is warm and dry.  Neurological:     Mental Status: He is alert.     Cranial Nerves: No cranial nerve deficit or dysarthria.     Motor: No weakness.     Gait: Gait normal.   Psychiatric:        Mood and Affect: Mood is  anxious.        Speech: Speech normal.        Behavior: Behavior normal.      UC Treatments / Results  Labs (all labs ordered are listed, but only abnormal results are displayed) Labs Reviewed  GLUCOSE, CAPILLARY - Abnormal; Notable for the following components:      Result Value   Glucose-Capillary 142 (*)    All other components within normal limits  CBG MONITORING, ED    EKG   Radiology No results found.  Procedures Procedures (including critical care time)  Medications Ordered in UC Medications - No data to display  Initial Impression / Assessment and Plan / UC Course  I have reviewed the triage vital signs and the nursing notes.  Pertinent labs & imaging results that were available during my care of the patient were reviewed by me and considered in my medical decision making (see chart for details).    Final Clinical Impressions(s) / UC Diagnoses   Final diagnoses:  Essential hypertension  Controlled type 2 diabetes mellitus without complication, without long-term current use of insulin (HCC)  Generalized anxiety disorder   Discharge Instructions   None    ED Prescriptions    Medication Sig Dispense Auth. Provider   amLODipine (NORVASC) 5 MG tablet Take 1 tablet (5 mg total) by mouth daily. 90 tablet Robyn Haber, MD   lisinopril (ZESTRIL) 10 MG tablet Take 1 tablet (10 mg total) by mouth daily. 90 tablet Robyn Haber, MD   LORazepam (ATIVAN) 0.5 MG tablet Take 1 tablet (0.5 mg total) by mouth daily as needed for anxiety. 30 tablet Robyn Haber, MD     I have reviewed the PDMP during this encounter.   Robyn Haber, MD 07/12/19 1610    Robyn Haber, MD 07/12/19 [phone]

## 2019-09-13 ENCOUNTER — Emergency Department (HOSPITAL_COMMUNITY)
Admission: EM | Admit: 2019-09-13 | Discharge: 2019-09-13 | Disposition: A | Discharge status: Home / Self Care | Payer: Self-pay | Attending: Emergency Medicine | Admitting: Emergency Medicine

## 2019-09-13 ENCOUNTER — Other Ambulatory Visit: Payer: Self-pay

## 2019-09-13 ENCOUNTER — Encounter (HOSPITAL_COMMUNITY): Payer: Self-pay | Admitting: Emergency Medicine

## 2019-09-13 DIAGNOSIS — I10 Essential (primary) hypertension: Secondary | ICD-10-CM | POA: Insufficient documentation

## 2019-09-13 DIAGNOSIS — Z5321 Procedure and treatment not carried out due to patient leaving prior to being seen by health care provider: Secondary | ICD-10-CM | POA: Insufficient documentation

## 2019-09-13 NOTE — ED Triage Notes (Signed)
Patient reports persistent high blood pressure for several days unrelieved by prescription medication , denies chest pain /respirations unlabored .

## 2019-09-13 NOTE — ED Notes (Signed)
Called for pt x3. No answer.  

## 2019-10-23 ENCOUNTER — Ambulatory Visit (HOSPITAL_COMMUNITY)
Admission: EM | Admit: 2019-10-23 | Discharge: 2019-10-23 | Disposition: A | Discharge status: Home / Self Care | Payer: Self-pay | Attending: Family Medicine | Admitting: Family Medicine

## 2019-10-23 ENCOUNTER — Encounter (HOSPITAL_COMMUNITY): Payer: Self-pay

## 2019-10-23 ENCOUNTER — Other Ambulatory Visit: Payer: Self-pay

## 2019-10-23 DIAGNOSIS — Z87898 Personal history of other specified conditions: Secondary | ICD-10-CM

## 2019-10-23 DIAGNOSIS — I10 Essential (primary) hypertension: Secondary | ICD-10-CM

## 2019-10-23 DIAGNOSIS — R42 Dizziness and giddiness: Secondary | ICD-10-CM

## 2019-10-23 LAB — GLUCOSE, CAPILLARY: Glucose-Capillary: 113 mg/dL — ABNORMAL HIGH (ref 70–99)

## 2019-10-23 LAB — CBG MONITORING, ED: Glucose-Capillary: 113 mg/dL — ABNORMAL HIGH (ref 70–99)

## 2019-10-23 NOTE — ED Triage Notes (Addendum)
Pt c/o dizziness onset approx 1 hour after checking BP and it reading "High" 140s/115s per pt. Denies slurred speech, smile symmetrical, gait stable.  States h/o anxiety and took lisinopril approx 2 hours ago. States he does not always take BOTH Norvasc and Lisinopril daily as he should.

## 2019-10-23 NOTE — Discharge Instructions (Signed)
Take your BP medicine at night Your blood sugar is normal Reduce the salt and sugar in your diet See your primary care doctor on a regular basis Your Primary care doctor should do any adjustments to your blood pressure medicine

## 2019-10-23 NOTE — ED Provider Notes (Signed)
[facility]    CSN: [id number] Arrival date & time: 10/23/19  1219      History   Chief Complaint Chief Complaint  Patient presents with  . Dizziness    HPI Wesley [name] is a 54 y.o. male.   HPI  Wesley [name] is here to be evaluated for dizziness. He is well-known to the emergency room and urgent care providers for multiple visits for dizziness, hypertension, and multiple complaints.  Often related to anxiety.  He had over 25 visits to the emergency department/urgent care last year. He is here today stating that he woke up and took his blood pressure.  The blood pressure was high.  Learning that his blood pressure was high made him feel dizzy.  He then took his lisinopril.  Its been a couple hours since his lisinopril dose.  His blood pressure is now near normal at 138/92.  He currently feels well.  He has multiple questions about his blood pressure.  He wants to know why he wakes up in the middle the night, feels hot, and his blood pressure is elevated.  I am uncertain regarding the answer to this question.  I told him that his blood pressure appears to be well controlled. He tells me that he was diagnosed with prediabetes.  He had like to know what his blood sugar is today.  We tested it is 113.  I reassured him that this is normal as well. He tells me he does not think his blood pressure medicine is strong enough.  I told him that his blood pressure is normal today.  I am trying to be reassuring.  I told him that blood pressure adjustments should be made through his primary care doctor, and that he needs to see them on a regular basis. He mentions that his anxiety has been bothering him.  I told him that I can refill his Atarax, and he specifically asks for lorazepam.  I told him that that will not be refilled through this office today.  Again, reinforced that he needs to see his primary care doctor. No chest pain, shortness of breath, presyncope, leg edema, or worrisome  symptoms noted  Past Medical History:  Diagnosis Date  . Alcohol abuse   . Anxiety   . Anxiety   . Arthritis   . Chronic neck pain   . Cocaine abuse (HCC)   . Dizziness   . GERD (gastroesophageal reflux disease)   . Hypercholesteremia   . Hypertension   . Prediabetes     Patient Active Problem List   Diagnosis Date Noted  . Legally blind in right eye, as defined in [place] 05/02/2017  . Prediabetes 04/23/2017  . Muscle spasm 04/23/2017  . Essential hypertension 04/16/2017  . H/O medication noncompliance 04/02/2017  . Vitamin D deficiency 04/02/2017  . DJD (degenerative joint disease) of cervical spine 03/16/2017  . 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 Dizziness 01/23/2017  . Anxiety 11/21/2016    Past Surgical History:  Procedure Laterality Date  . EYE SURGERY Right ~ 1980   "hit my eye"       Home Medications    Prior to Admission medications   Medication Sig Start Date End Date Taking? Authorizing Provider  amLODipine (NORVASC) 5 MG tablet Take 1 tablet (5 mg total) by mouth daily. 07/12/19  Yes Elvina Sidle, MD  famotidine (PEPCID) 20 MG tablet Take 1 tablet (20 mg total) by mouth 2 (two) times daily. 06/19/19  Yes Lamptey, Britta Mccreedy, MD  lisinopril (ZESTRIL) 10 MG tablet Take 1 tablet (10 mg total) by mouth daily. 07/12/19  Yes Robyn Haber, MD  acetaminophen (TYLENOL) 500 MG tablet Take 1 tablet (500 mg total) by mouth every 6 (six) hours as needed. 12/19/18   Jaynee Eagles, PA-C  hydrOXYzine (ATARAX/VISTARIL) 25 MG tablet Take 1 tablet (25 mg total) by mouth every 6 (six) hours. 06/19/19   Chase Picket, MD  omeprazole (PRILOSEC) 40 MG capsule Take 1 capsule (40 mg total) by mouth daily. 12/19/18 03/02/19  McDonald, Mia A, PA-C  sucralfate (CARAFATE) 1 GM/10ML suspension Take 10 mLs (1 g total) by mouth 4 (four) times daily -  with meals and at bedtime. 12/22/18 03/02/19  Frederica Kuster, PA-C    Family History Family History  Problem Relation Age of Onset  .  Hypertension Mother   . Hypertension Father   . Colon cancer Neg Hx   . Colon polyps Neg Hx   . Esophageal cancer Neg Hx   . Rectal cancer Neg Hx   . Stomach cancer Neg Hx     Social History Social History   Tobacco Use  . Smoking status: Former Research scientist (life sciences)  . Smokeless tobacco: Never Used  Substance Use Topics  . Alcohol use: Yes    Comment: socially  . Drug use: Not Currently    Comment: denies, reports past history      Allergies   Ibuprofen, Other, and Zofran [ondansetron hcl]   Review of Systems Review of Systems  Constitutional: Negative for chills and diaphoresis.  Respiratory: Negative for shortness of breath.   Cardiovascular: Negative for palpitations and leg swelling.  Gastrointestinal: Negative for nausea and vomiting.  Neurological: Positive for dizziness. Negative for headaches.  Psychiatric/Behavioral: Positive for sleep disturbance. The patient is nervous/anxious.      Physical Exam Triage Vital Signs ED Triage Vitals  Enc Vitals Group     BP 10/23/19 1246 (!) 158/101     Pulse Rate 10/23/19 1246 (!) 101     Resp 10/23/19 1246 18     Temp 10/23/19 1246 98.1 F (36.7 C)     Temp Source 10/23/19 1246 Oral     SpO2 10/23/19 1246 100 %     Weight --      Height --      Head Circumference --      Peak Flow --      Pain Score 10/23/19 1244 0     Pain Loc --      Pain Edu? --      Excl. in [place]? --    No data found.  Updated Vital Signs BP (!) 138/92   Pulse (!) 101   Temp 98.1 F (36.7 C) (Oral)   Resp 18   SpO2 100%      Physical Exam Constitutional:      General: He is not in acute distress.    Appearance: He is well-developed.     Comments: overweight  HENT:     Head: Normocephalic and atraumatic.     Mouth/Throat:     Comments: Mask in place Eyes:     Conjunctiva/sclera: Conjunctivae normal.     Pupils: Pupils are equal, round, and reactive to light.  Cardiovascular:     Rate and Rhythm: Normal rate and regular rhythm.      Heart sounds: Normal heart sounds.     Comments: Repeat BP 138/92 Pulmonary:     Effort: Pulmonary effort is normal. No respiratory distress.  Breath sounds: Normal breath sounds. No wheezing or rales.  Musculoskeletal:        General: Normal range of motion.     Cervical back: Normal range of motion.     Right lower leg: No edema.     Left lower leg: No edema.  Skin:    General: Skin is warm and dry.     Comments: paint on skin  Neurological:     General: No focal deficit present.     Mental Status: He is alert.  Psychiatric:        Mood and Affect: Mood normal.        Behavior: Behavior normal.      UC Treatments / Results  Labs (all labs ordered are listed, but only abnormal results are displayed) Labs Reviewed  GLUCOSE, CAPILLARY - Abnormal; Notable for the following components:      Result Value   Glucose-Capillary 113 (*)    All other components within normal limits  CBG MONITORING, ED - Abnormal; Notable for the following components:   Glucose-Capillary 113 (*)    All other components within normal limits    EKG   Radiology No results found.  Procedures Procedures (including critical care time)  Medications Ordered in UC Medications - No data to display  Initial Impression / Assessment and Plan / UC Course  I have reviewed the triage vital signs and the nursing notes.  Pertinent labs & imaging results that were available during my care of the patient were reviewed by me and considered in my medical decision making (see chart for details).     Reinforced normal BP Reinforced normal sugar Reinforced need to follow PCP Final Clinical Impressions(s) / UC Diagnoses   Final diagnoses:  Essential hypertension  History of prediabetes     Discharge Instructions     Take your BP medicine at night Your blood sugar is normal Reduce the salt and sugar in your diet See your primary care doctor on a regular basis Your Primary care doctor should do  any adjustments to your blood pressure medicine      ED Prescriptions    None     PDMP not reviewed this encounter.   Eustace Moore, MD 10/23/19 [id number]

## 2019-10-26 ENCOUNTER — Emergency Department (HOSPITAL_COMMUNITY)
Admission: EM | Admit: 2019-10-26 | Discharge: 2019-10-26 | Disposition: A | Discharge status: Home / Self Care | Payer: Self-pay | Attending: Emergency Medicine | Admitting: Emergency Medicine

## 2019-10-26 ENCOUNTER — Other Ambulatory Visit: Payer: Self-pay

## 2019-10-26 ENCOUNTER — Encounter (HOSPITAL_COMMUNITY): Payer: Self-pay | Admitting: Emergency Medicine

## 2019-10-26 DIAGNOSIS — I10 Essential (primary) hypertension: Secondary | ICD-10-CM | POA: Insufficient documentation

## 2019-10-26 DIAGNOSIS — Z5321 Procedure and treatment not carried out due to patient leaving prior to being seen by health care provider: Secondary | ICD-10-CM | POA: Insufficient documentation

## 2019-10-26 NOTE — ED Triage Notes (Signed)
Pt states he forgot to take his BP medication yesterday and by midnight his BP was 160/110. Pt took this BP medication by 4 am today. Denies any dizziness or pain, pt is AO x 4 no neuro deficit noticed.

## 2019-12-02 ENCOUNTER — Encounter (HOSPITAL_COMMUNITY): Payer: Self-pay | Admitting: Emergency Medicine

## 2019-12-02 ENCOUNTER — Other Ambulatory Visit: Payer: Self-pay

## 2019-12-02 ENCOUNTER — Emergency Department (HOSPITAL_COMMUNITY)
Admission: EM | Admit: 2019-12-02 | Discharge: 2019-12-02 | Disposition: A | Discharge status: Home / Self Care | Payer: Self-pay | Attending: Emergency Medicine | Admitting: Emergency Medicine

## 2019-12-02 DIAGNOSIS — Z5321 Procedure and treatment not carried out due to patient leaving prior to being seen by health care provider: Secondary | ICD-10-CM | POA: Insufficient documentation

## 2019-12-02 DIAGNOSIS — I10 Essential (primary) hypertension: Secondary | ICD-10-CM | POA: Insufficient documentation

## 2019-12-02 NOTE — ED Triage Notes (Signed)
Patient reports hypertension this evening , denies SOB , no pain , alert and oriented .

## 2019-12-02 NOTE — ED Notes (Signed)
Pt name called x3 with no response. 

## 2019-12-11 ENCOUNTER — Emergency Department (HOSPITAL_COMMUNITY)
Admission: EM | Admit: 2019-12-11 | Discharge: 2019-12-11 | Disposition: A | Discharge status: Home / Self Care | Payer: Self-pay | Attending: Emergency Medicine | Admitting: Emergency Medicine

## 2019-12-11 ENCOUNTER — Encounter (HOSPITAL_COMMUNITY): Payer: Self-pay | Admitting: Emergency Medicine

## 2019-12-11 ENCOUNTER — Other Ambulatory Visit: Payer: Self-pay

## 2019-12-11 DIAGNOSIS — Y939 Activity, unspecified: Secondary | ICD-10-CM | POA: Insufficient documentation

## 2019-12-11 DIAGNOSIS — W208XXA Other cause of strike by thrown, projected or falling object, initial encounter: Secondary | ICD-10-CM | POA: Insufficient documentation

## 2019-12-11 DIAGNOSIS — I10 Essential (primary) hypertension: Secondary | ICD-10-CM | POA: Insufficient documentation

## 2019-12-11 DIAGNOSIS — Z79899 Other long term (current) drug therapy: Secondary | ICD-10-CM | POA: Insufficient documentation

## 2019-12-11 DIAGNOSIS — R42 Dizziness and giddiness: Secondary | ICD-10-CM | POA: Insufficient documentation

## 2019-12-11 DIAGNOSIS — Y999 Unspecified external cause status: Secondary | ICD-10-CM | POA: Insufficient documentation

## 2019-12-11 DIAGNOSIS — S0502XA Injury of conjunctiva and corneal abrasion without foreign body, left eye, initial encounter: Secondary | ICD-10-CM | POA: Insufficient documentation

## 2019-12-11 DIAGNOSIS — Y929 Unspecified place or not applicable: Secondary | ICD-10-CM | POA: Insufficient documentation

## 2019-12-11 DIAGNOSIS — Z87891 Personal history of nicotine dependence: Secondary | ICD-10-CM | POA: Insufficient documentation

## 2019-12-11 LAB — CBC WITH DIFFERENTIAL/PLATELET
Abs Immature Granulocytes: 0.03 10*3/uL (ref 0.00–0.07)
Basophils Absolute: 0 10*3/uL (ref 0.0–0.1)
Basophils Relative: 1 %
Eosinophils Absolute: 0.3 10*3/uL (ref 0.0–0.5)
Eosinophils Relative: 4 %
HCT: 44.5 % (ref 39.0–52.0)
Hemoglobin: 14.2 g/dL (ref 13.0–17.0)
Immature Granulocytes: 1 %
Lymphocytes Relative: 42 %
Lymphs Abs: 2.8 10*3/uL (ref 0.7–4.0)
MCH: 27.6 pg (ref 26.0–34.0)
MCHC: 31.9 g/dL (ref 30.0–36.0)
MCV: 86.6 fL (ref 80.0–100.0)
Monocytes Absolute: 0.5 10*3/uL (ref 0.1–1.0)
Monocytes Relative: 8 %
Neutro Abs: 2.9 10*3/uL (ref 1.7–7.7)
Neutrophils Relative %: 44 %
Platelets: 263 10*3/uL (ref 150–400)
RBC: 5.14 MIL/uL (ref 4.22–5.81)
RDW: 13.4 % (ref 11.5–15.5)
WBC: 6.5 10*3/uL (ref 4.0–10.5)
nRBC: 0 % (ref 0.0–0.2)

## 2019-12-11 LAB — COMPREHENSIVE METABOLIC PANEL
ALT: 40 U/L (ref 0–44)
AST: 37 U/L (ref 15–41)
Albumin: 4.1 g/dL (ref 3.5–5.0)
Alkaline Phosphatase: 73 U/L (ref 38–126)
Anion gap: 11 (ref 5–15)
BUN: 8 mg/dL (ref 6–20)
CO2: 24 mmol/L (ref 22–32)
Calcium: 9.1 mg/dL (ref 8.9–10.3)
Chloride: 102 mmol/L (ref 98–111)
Creatinine, Ser: 0.73 mg/dL (ref 0.61–1.24)
GFR calc Af Amer: >60 mL/min (ref >60)
GFR calc non Af Amer: >60 mL/min (ref >60)
Glucose, Bld: 135 mg/dL — ABNORMAL HIGH (ref 70–99)
Potassium: 3.7 mmol/L (ref 3.5–5.1)
Sodium: 137 mmol/L (ref 135–145)
Total Bilirubin: 0.7 mg/dL (ref 0.3–1.2)
Total Protein: 8.3 g/dL — ABNORMAL HIGH (ref 6.5–8.1)

## 2019-12-11 LAB — CBG MONITORING, ED: Glucose-Capillary: 129 mg/dL — ABNORMAL HIGH (ref 70–99)

## 2019-12-11 MED ORDER — TETRACAINE HCL 0.5 % OP SOLN
2.0000 [drp] | Freq: Once | OPHTHALMIC | Status: AC
  Administered 2019-12-11: 10:00:00 2 [drp] via OPHTHALMIC
  Filled 2019-12-11: qty 4

## 2019-12-11 MED ORDER — ERYTHROMYCIN 5 MG/GM OP OINT: TOPICAL_OINTMENT | OPHTHALMIC | 0 refills | Status: DC

## 2019-12-11 MED ORDER — FLUORESCEIN SODIUM 1 MG OP STRP
1.0000 | ORAL_STRIP | Freq: Once | OPHTHALMIC | Status: AC
  Administered 2019-12-11: 1 via OPHTHALMIC
  Filled 2019-12-11: qty 1

## 2019-12-11 MED ORDER — SODIUM CHLORIDE 0.9 % IV BOLUS: 1000.0000 mL | Freq: Once | INTRAVENOUS | Status: DC

## 2019-12-11 NOTE — ED Triage Notes (Signed)
Pt states he was working yesterday and got a small piece of concrete in his left eye, pt then put some rinsing eye drops into his eye pt states he woke up this morning and with his left eye "blurry". Pt has no redness or swelling to eye. Pt has no vision in right eye at baseline.

## 2019-12-11 NOTE — ED Notes (Signed)
If ordered pt has blood work in the lab.

## 2019-12-11 NOTE — ED Provider Notes (Signed)
[facility] EMERGENCY DEPARTMENT Provider Note   CSN: [id number] Arrival date & time: 12/11/19  0753     History Chief Complaint  Patient presents with  . Eye Problem    Wesley [name] is a 54 y.o. male.  HPI 54 year old male presents with left eye blurry vision.  Woke up this way at 5 AM.  No headache, eye pain, vomiting, or weakness/numbness.  No chest pain.  He states he took his blood pressure medicine this morning.  He also has been feeling a little dizzy over the last few days.  He wonders if he has developed diabetes as he was told he is a prediabetic.  Yesterday he he also had a piece of concrete fly up and hit his inferior eyelid but he does not think it may contact with his eye.  However he does feel a mild foreign body sensation in his eye.  Right eye has no vision from prior injury.  He is not seeing double vision. Does not wear contacts.   Past Medical History:  Diagnosis Date  . Alcohol abuse   . Anxiety   . Anxiety   . Arthritis   . Chronic neck pain   . Cocaine abuse (HCC)   . Dizziness   . GERD (gastroesophageal reflux disease)   . Hypercholesteremia   . Hypertension   . Prediabetes     Patient Active Problem List   Diagnosis Date Noted  . Legally blind in right eye, as defined in [place] 05/02/2017  . Prediabetes 04/23/2017  . Muscle spasm 04/23/2017  . Essential hypertension 04/16/2017  . H/O medication noncompliance 04/02/2017  . Vitamin D deficiency 04/02/2017  . DJD (degenerative joint disease) of cervical spine 03/16/2017  . 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 Dizziness 01/23/2017  . Anxiety 11/21/2016    Past Surgical History:  Procedure Laterality Date  . EYE SURGERY Right ~ 1980   "hit my eye"       Family History  Problem Relation Age of Onset  . Hypertension Mother   . Hypertension Father   . Colon cancer Neg Hx   . Colon polyps Neg Hx   . Esophageal cancer Neg Hx   . Rectal cancer Neg Hx   . Stomach cancer Neg Hx      Social History   Tobacco Use  . Smoking status: Former Games developer  . Smokeless tobacco: Never Used  Substance Use Topics  . Alcohol use: Yes    Comment: socially  . Drug use: Not Currently    Comment: denies, reports past history     Home Medications Prior to Admission medications   Medication Sig Start Date End Date Taking? Authorizing Provider  acetaminophen (TYLENOL) 500 MG tablet Take 1 tablet (500 mg total) by mouth every 6 (six) hours as needed. 12/19/18  Yes Wallis Bamberg, PA-C  amLODipine (NORVASC) 5 MG tablet Take 1 tablet (5 mg total) by mouth daily. 07/12/19  Yes Elvina Sidle, MD  lisinopril (ZESTRIL) 10 MG tablet Take 1 tablet (10 mg total) by mouth daily. Patient taking differently: Take 5 mg by mouth daily.  07/12/19  Yes Elvina Sidle, MD  LORazepam (ATIVAN) 0.5 MG tablet Take 0.5 mg by mouth daily as needed for anxiety.   Yes [provider]  erythromycin ophthalmic ointment Place a 1/2 inch ribbon of ointment into the lower eyelid 4 times per day for 4 days 12/11/19   Pricilla Loveless, MD  famotidine (PEPCID) 20 MG tablet Take 1 tablet (20 mg total)  by mouth 2 (two) times daily. Patient not taking: Reported on 12/11/2019 06/19/19   Chase Picket, MD  hydrOXYzine (ATARAX/VISTARIL) 25 MG tablet Take 1 tablet (25 mg total) by mouth every 6 (six) hours. Patient not taking: Reported on 12/11/2019 06/19/19   Chase Picket, MD  omeprazole (PRILOSEC) 40 MG capsule Take 1 capsule (40 mg total) by mouth daily. 12/19/18 03/02/19  McDonald, Mia A, PA-C  sucralfate (CARAFATE) 1 GM/10ML suspension Take 10 mLs (1 g total) by mouth 4 (four) times daily -  with meals and at bedtime. 12/22/18 03/02/19  Frederica Kuster, PA-C    Allergies    Ibuprofen, Other, and Zofran [ondansetron hcl]  Review of Systems   Review of Systems  Eyes: Positive for visual disturbance. Negative for pain and redness.  Respiratory: Negative for shortness of breath.   Cardiovascular: Negative for  chest pain.  Gastrointestinal: Negative for vomiting.  Neurological: Negative for weakness, numbness and headaches.  All other systems reviewed and are negative.   Physical Exam Updated Vital Signs BP (!) 161/99   Pulse (!) 102   Temp 98.2 F (36.8 C) (Oral)   Resp 16   Ht 5\' 6"  (1.676 m)   Wt 99.8 kg   SpO2 100%   BMI 35.51 kg/m   Physical Exam Vitals and nursing note reviewed.  Constitutional:      Appearance: He is well-developed.  HENT:     Head: Normocephalic and atraumatic.     Right Ear: External ear normal.     Left Ear: External ear normal.     Nose: Nose normal.  Eyes:     General:        Right eye: No discharge.        Left eye: No discharge.     Extraocular Movements: Extraocular movements intact.     Comments: Left eye has normal external appearance. Pupil appropriately reacts to light. No injection. Normal EOM On fluorescein staining there is a small abrasion over pupil. No obvious foreign body.  Cardiovascular:     Rate and Rhythm: Normal rate and regular rhythm.     Heart sounds: Normal heart sounds.  Pulmonary:     Effort: Pulmonary effort is normal.     Breath sounds: Normal breath sounds.  Abdominal:     Palpations: Abdomen is soft.     Tenderness: There is no abdominal tenderness.  Musculoskeletal:     Cervical back: Neck supple.  Skin:    General: Skin is warm and dry.  Neurological:     Mental Status: He is alert.     Comments: CN 3-12 grossly intact. 5/5 strength in all 4 extremities. Grossly normal sensation. Normal finger to nose. Normal gait  Psychiatric:        Mood and Affect: Mood is anxious.     ED Results / Procedures / Treatments   Labs (all labs ordered are listed, but only abnormal results are displayed) Labs Reviewed  COMPREHENSIVE METABOLIC PANEL - Abnormal; Notable for the following components:      Result Value   Glucose, Bld 135 (*)    Total Protein 8.3 (*)    All other components within normal limits  CBG  MONITORING, ED - Abnormal; Notable for the following components:   Glucose-Capillary 129 (*)    All other components within normal limits  CBC WITH DIFFERENTIAL/PLATELET    EKG EKG Interpretation  Date/Time:  Thursday December 11 2019 07:54:07 EDT Ventricular Rate:  102 PR Interval:  130 QRS Duration: 94 QT Interval:  348 QTC Calculation: 453 R Axis:   -56 Text Interpretation: Sinus tachycardia Left axis deviation Left ventricular hypertrophy ( R in aVL , Cornell product , Romhilt-Estes ) Abnormal ECG no significant change since 2020 Confirmed by Pricilla Loveless [phone]) on 12/11/2019 8:18:16 AM   Radiology No results found.  Procedures Procedures (including critical care time)  Medications Ordered in ED Medications  tetracaine (PONTOCAINE) 0.5 % ophthalmic solution 2 drop (2 drops Left Eye Given by Other 12/11/19 0935)  fluorescein ophthalmic strip 1 strip (1 strip Left Eye Given 12/11/19 0936)    ED Course  I have reviewed the triage vital signs and the nursing notes.  Pertinent labs & imaging results that were available during my care of the patient were reviewed by me and considered in my medical decision making (see chart for details).    MDM Rules/Calculators/A&P                      Patient was found to have a small corneal abrasion but no obvious foreign body.  Neuro exam is unremarkable and there is no headache.  I do not think this blurry vision is a representation of endorgan hypertension.  In fact when I talk to him more he has been waking up with blurry vision in the left eye for quite some time.  Maybe does not last as long but still has been present.  At this point, there is no evidence of acute emergent condition and I think we can treat his corneal abrasion and have him follow-up with his ophthalmologist and PCP. Final Clinical Impression(s) / ED Diagnoses Final diagnoses:  Abrasion of left cornea, initial encounter    Rx / DC Orders ED Discharge Orders          Ordered    erythromycin ophthalmic ointment     12/11/19 0959           Pricilla Loveless, MD 12/11/19 1015

## 2019-12-11 NOTE — ED Notes (Signed)
Vision acuity screen completed:   Right eye: no vision due previous accident years ago.   Left eye 20/50 on 94ft equivalent  Both eyes 20/50 on 74ft equivalent

## 2019-12-28 ENCOUNTER — Encounter (HOSPITAL_COMMUNITY): Payer: Self-pay | Admitting: Emergency Medicine

## 2019-12-28 ENCOUNTER — Emergency Department (HOSPITAL_COMMUNITY)
Admission: EM | Admit: 2019-12-28 | Discharge: 2019-12-28 | Disposition: A | Discharge status: Home / Self Care | Payer: Self-pay | Attending: Emergency Medicine | Admitting: Emergency Medicine

## 2019-12-28 ENCOUNTER — Other Ambulatory Visit: Payer: Self-pay

## 2019-12-28 DIAGNOSIS — M79602 Pain in left arm: Secondary | ICD-10-CM | POA: Insufficient documentation

## 2019-12-28 DIAGNOSIS — Z5321 Procedure and treatment not carried out due to patient leaving prior to being seen by health care provider: Secondary | ICD-10-CM | POA: Insufficient documentation

## 2019-12-28 NOTE — ED Notes (Signed)
Pt has gone outside.

## 2019-12-28 NOTE — ED Triage Notes (Signed)
Pt reports he was lifting something heavy and now his left arm is in pain.

## 2020-02-26 ENCOUNTER — Other Ambulatory Visit: Payer: Self-pay

## 2020-02-26 ENCOUNTER — Encounter (HOSPITAL_COMMUNITY): Payer: Self-pay

## 2020-02-26 ENCOUNTER — Ambulatory Visit (HOSPITAL_COMMUNITY)
Admission: EM | Admit: 2020-02-26 | Discharge: 2020-02-26 | Disposition: A | Discharge status: Home / Self Care | Payer: Self-pay

## 2020-02-26 NOTE — ED Triage Notes (Signed)
Pt presents with nausea X 2 days.  Pt tested negative for covid last week

## 2020-03-07 ENCOUNTER — Ambulatory Visit (HOSPITAL_COMMUNITY)
Admission: EM | Admit: 2020-03-07 | Discharge: 2020-03-07 | Disposition: A | Discharge status: Home / Self Care | Payer: Self-pay | Attending: Urgent Care | Admitting: Urgent Care

## 2020-03-07 ENCOUNTER — Encounter (HOSPITAL_COMMUNITY): Payer: Self-pay | Admitting: Emergency Medicine

## 2020-03-07 ENCOUNTER — Other Ambulatory Visit: Payer: Self-pay

## 2020-03-07 DIAGNOSIS — R030 Elevated blood-pressure reading, without diagnosis of hypertension: Secondary | ICD-10-CM

## 2020-03-07 DIAGNOSIS — R202 Paresthesia of skin: Secondary | ICD-10-CM

## 2020-03-07 DIAGNOSIS — R7303 Prediabetes: Secondary | ICD-10-CM

## 2020-03-07 DIAGNOSIS — R142 Eructation: Secondary | ICD-10-CM

## 2020-03-07 DIAGNOSIS — R140 Abdominal distension (gaseous): Secondary | ICD-10-CM

## 2020-03-07 DIAGNOSIS — R42 Dizziness and giddiness: Secondary | ICD-10-CM

## 2020-03-07 DIAGNOSIS — I10 Essential (primary) hypertension: Secondary | ICD-10-CM

## 2020-03-07 LAB — CBG MONITORING, ED: Glucose-Capillary: 83 mg/dL (ref 70–99)

## 2020-03-07 MED ORDER — FAMOTIDINE 20 MG PO TABS: 20.0000 mg | ORAL_TABLET | Freq: Two times a day (BID) | ORAL | 5 refills | Status: DC

## 2020-03-07 NOTE — ED Triage Notes (Signed)
Pt sts generalized tingling and feeling dizzy at times x 2 days; pt seen for same with hx of anxiety

## 2020-03-07 NOTE — ED Provider Notes (Signed)
[facility]   MRN: [id number] DOB: 23-Jul-1966  Subjective:   Wesley [name] is a 54 y.o. male presenting for 2-day history of transient intermittent dizziness, abdominal fullness, belching.  Patient is worried about his blood sugar and blood pressure.  States that he does take his medications as prescribed.  Admits that he does not practice a healthy diet.  He does eat out at restaurants, also eats carb heavy foods.  Denies chest pain, heart racing, confusion, headache, vision change, weakness, hematuria, abdominal pain.  No current facility-administered medications for this encounter.  Current Outpatient Medications:  .  acetaminophen (TYLENOL) 500 MG tablet, Take 1 tablet (500 mg total) by mouth every 6 (six) hours as needed., Disp: 30 tablet, Rfl: 0 .  amLODipine (NORVASC) 5 MG tablet, Take 1 tablet (5 mg total) by mouth daily., Disp: 90 tablet, Rfl: 3 .  erythromycin ophthalmic ointment, Place a 1/2 inch ribbon of ointment into the lower eyelid 4 times per day for 4 days (Patient not taking: Reported on 03/07/2020), Disp: 3.5 g, Rfl: 0 .  famotidine (PEPCID) 20 MG tablet, Take 1 tablet (20 mg total) by mouth 2 (two) times daily. (Patient not taking: Reported on 12/11/2019), Disp: 30 tablet, Rfl: 6 .  hydrOXYzine (ATARAX/VISTARIL) 25 MG tablet, Take 1 tablet (25 mg total) by mouth every 6 (six) hours. (Patient not taking: Reported on 12/11/2019), Disp: 30 tablet, Rfl: 0 .  lisinopril (ZESTRIL) 10 MG tablet, Take 1 tablet (10 mg total) by mouth daily. (Patient taking differently: Take 5 mg by mouth daily. ), Disp: 90 tablet, Rfl: 3 .  LORazepam (ATIVAN) 0.5 MG tablet, Take 0.5 mg by mouth daily as needed for anxiety., Disp: , Rfl:    Allergies  Allergen Reactions  . Ibuprofen Other (See Comments) and Rash    Makes his throat get "very dry" after taking it States he cant take it  . Other Other (See Comments)    Steroids: Patient received a steroid shot from [facility] (336)  [phone] and it resulted in his muscles twitching all over his body  . Zofran [Ondansetron Hcl] Other (See Comments)    Causes sweating and body "feels badly"    Past Medical History:  Diagnosis Date  . Alcohol abuse   . Anxiety   . Anxiety   . Arthritis   . Chronic neck pain   . Cocaine abuse ([place])   . Dizziness   . GERD (gastroesophageal reflux disease)   . Hypercholesteremia   . Hypertension   . Prediabetes      Past Surgical History:  Procedure Laterality Date  . EYE SURGERY Right ~ 1980   "hit my eye"    Family History  Problem Relation Age of Onset  . Hypertension Mother   . Hypertension Father   . Colon cancer Neg Hx   . Colon polyps Neg Hx   . Esophageal cancer Neg Hx   . Rectal cancer Neg Hx   . Stomach cancer Neg Hx     Social History   Tobacco Use  . Smoking status: Former Research scientist (life sciences)  . Smokeless tobacco: Never Used  Vaping Use  . Vaping Use: Never used  Substance Use Topics  . Alcohol use: Yes    Comment: socially  . Drug use: Not Currently    Comment: denies, reports past history     ROS   Objective:   Vitals: BP (!) 138/101 (BP Location: Left Arm)   Pulse 83   Temp 97.9  F (36.6 C) (Oral)   Resp 18   SpO2 98%   Physical Exam Constitutional:      General: He is not in acute distress.    Appearance: Normal appearance. He is well-developed. He is not ill-appearing, toxic-appearing or diaphoretic.  HENT:     Head: Normocephalic and atraumatic.     Right Ear: External ear normal.     Left Ear: External ear normal.     Nose: Nose normal.     Mouth/Throat:     Mouth: Mucous membranes are moist.     Pharynx: Oropharynx is clear.  Eyes:     General: No scleral icterus.       Right eye: No discharge.        Left eye: No discharge.     Extraocular Movements: Extraocular movements intact.     Pupils: Pupils are equal, round, and reactive to light.  Cardiovascular:     Rate and Rhythm: Normal rate and regular rhythm.     Heart sounds:  Normal heart sounds. No murmur heard.  No friction rub. No gallop.   Pulmonary:     Effort: Pulmonary effort is normal. No respiratory distress.     Breath sounds: Normal breath sounds. No stridor. No wheezing, rhonchi or rales.  Musculoskeletal:     Right lower leg: No edema.     Left lower leg: No edema.  Skin:    General: Skin is warm and dry.  Neurological:     Mental Status: He is alert and oriented to person, place, and time.     Cranial Nerves: No cranial nerve deficit.     Motor: No weakness.     Coordination: Coordination normal.     Gait: Gait normal.     Deep Tendon Reflexes: Reflexes normal.  Psychiatric:        Mood and Affect: Mood normal.        Behavior: Behavior normal.        Thought Content: Thought content normal.        Judgment: Judgment normal.     Results for orders placed or performed during the hospital encounter of 03/07/20 (from the past 24 hour(s))  POC CBG monitoring     Status: None   Collection Time: 03/07/20  6:11 PM  Result Value Ref Range   Glucose-Capillary 83 70 - 99 mg/dL    Assessment and Plan :   PDMP not reviewed this encounter.  1. Dizziness   2. Belching   3. Abdominal bloating   4. Elevated blood pressure reading   5. Essential hypertension   6. Prediabetes     Patient left prior to finishing his visit with me.  I had discussed with him before he left that he needed to make sure he was taking his blood pressure medications, eat a healthier diet.  On exam he did not have signs of acute process including MI, stroke.  I have prescribed him Pepcid to help him with acid reflux but was unable to discuss this with him as he left after his blood sugar check.   Wallis Bamberg, [place] 03/07/20 7253

## 2020-03-15 ENCOUNTER — Emergency Department (HOSPITAL_COMMUNITY)
Admission: EM | Admit: 2020-03-15 | Discharge: 2020-03-15 | Disposition: A | Discharge status: Home / Self Care | Payer: Self-pay | Attending: Emergency Medicine | Admitting: Emergency Medicine

## 2020-03-15 ENCOUNTER — Other Ambulatory Visit: Payer: Self-pay

## 2020-03-15 ENCOUNTER — Encounter (HOSPITAL_COMMUNITY): Payer: Self-pay | Admitting: *Deleted

## 2020-03-15 DIAGNOSIS — Z5321 Procedure and treatment not carried out due to patient leaving prior to being seen by health care provider: Secondary | ICD-10-CM | POA: Insufficient documentation

## 2020-03-15 DIAGNOSIS — R42 Dizziness and giddiness: Secondary | ICD-10-CM | POA: Insufficient documentation

## 2020-03-15 NOTE — ED Notes (Signed)
Called for pt x3, no response. 

## 2020-03-15 NOTE — ED Triage Notes (Signed)
The pt has had many visits tot the ed for dizziness  Like tonight  He was seen at [facility] for this June 27 and was given meds he is worried about his bp also

## 2020-03-20 ENCOUNTER — Other Ambulatory Visit: Payer: Self-pay

## 2020-03-20 ENCOUNTER — Encounter (HOSPITAL_COMMUNITY): Payer: Self-pay | Admitting: Emergency Medicine

## 2020-03-20 ENCOUNTER — Emergency Department (HOSPITAL_COMMUNITY)
Admission: EM | Admit: 2020-03-20 | Discharge: 2020-03-21 | Disposition: A | Discharge status: Home / Self Care | Payer: Self-pay | Attending: Emergency Medicine | Admitting: Emergency Medicine

## 2020-03-20 DIAGNOSIS — Z5321 Procedure and treatment not carried out due to patient leaving prior to being seen by health care provider: Secondary | ICD-10-CM | POA: Insufficient documentation

## 2020-03-20 DIAGNOSIS — I10 Essential (primary) hypertension: Secondary | ICD-10-CM | POA: Insufficient documentation

## 2020-03-20 DIAGNOSIS — H538 Other visual disturbances: Secondary | ICD-10-CM | POA: Insufficient documentation

## 2020-03-20 LAB — CBG MONITORING, ED: Glucose-Capillary: 121 mg/dL — ABNORMAL HIGH (ref 70–99)

## 2020-03-20 NOTE — ED Triage Notes (Addendum)
Pt presents to ED POV. Pt states he takes his HTN  Medication but he wants to know his bp and CBG. Pt reports he has an appointment with his pcp next week but just wantwed to come get checked. Pt c/o blurred vision momentarily while watching tv tonight. Pt denies headache, cp, NAD.

## 2020-03-25 ENCOUNTER — Encounter (HOSPITAL_COMMUNITY): Payer: Self-pay

## 2020-03-25 ENCOUNTER — Ambulatory Visit (HOSPITAL_COMMUNITY)
Admission: EM | Admit: 2020-03-25 | Discharge: 2020-03-25 | Disposition: A | Discharge status: Home / Self Care | Payer: Self-pay | Attending: Internal Medicine | Admitting: Internal Medicine

## 2020-03-25 ENCOUNTER — Other Ambulatory Visit: Payer: Self-pay

## 2020-03-25 DIAGNOSIS — R42 Dizziness and giddiness: Secondary | ICD-10-CM | POA: Insufficient documentation

## 2020-03-25 LAB — VITAMIN D 25 HYDROXY (VIT D DEFICIENCY, FRACTURES): Vit D, 25-Hydroxy: 10.12 ng/mL — ABNORMAL LOW (ref 30–100)

## 2020-03-25 NOTE — ED Triage Notes (Signed)
Pt is here with dizziness that started at 9am this morning, pt has not taken anything to relieve discomfort.

## 2020-03-27 NOTE — ED Provider Notes (Signed)
[facility]    CSN: [id number] Arrival date & time: 03/25/20  1918      History   Chief Complaint Chief Complaint  Patient presents with  . Dizziness    HPI Lynn Sissel is a 54 y.o. male comes to urgent care because of dizziness which started this morning.  Patient says the symptoms have been over the past several days.  He does not explain the changes to the dizziness.  He has a history of hypertension and is compliant with his medications.  No ringing in the ears.  No ear pain or hearing loss.  No numbness or tingling.  No extremity weakness.  No difficulty finding his words.  Patient has been seen several times in the past for similar symptoms.  No change in his medications recently.Marland Kitchen   HPI  Past Medical History:  Diagnosis Date  . Alcohol abuse   . Anxiety   . Anxiety   . Arthritis   . Chronic neck pain   . Cocaine abuse (HCC)   . Dizziness   . GERD (gastroesophageal reflux disease)   . Hypercholesteremia   . Hypertension   . Prediabetes     Patient Active Problem List   Diagnosis Date Noted  . Legally blind in right eye, as defined in [place] 05/02/2017  . Prediabetes 04/23/2017  . Muscle spasm 04/23/2017  . Essential hypertension 04/16/2017  . H/O medication noncompliance 04/02/2017  . Vitamin D deficiency 04/02/2017  . DJD (degenerative joint disease) of cervical spine 03/16/2017  . 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 Dizziness 01/23/2017  . Anxiety 11/21/2016    Past Surgical History:  Procedure Laterality Date  . EYE SURGERY Right ~ 1980   "hit my eye"       Home Medications    Prior to Admission medications   Medication Sig Start Date End Date Taking? Authorizing Provider  acetaminophen (TYLENOL) 500 MG tablet Take 1 tablet (500 mg total) by mouth every 6 (six) hours as needed. 12/19/18   Wallis Bamberg, PA-C  amLODipine (NORVASC) 5 MG tablet Take 1 tablet (5 mg total) by mouth daily. 07/12/19   Elvina Sidle, MD  esomeprazole (NEXIUM) 20 MG  capsule Take 20 mg by mouth daily. 02/24/20   [provider]  famotidine (PEPCID) 20 MG tablet Take 1 tablet (20 mg total) by mouth 2 (two) times daily. 03/07/20   Wallis Bamberg, PA-C  lisinopril (ZESTRIL) 10 MG tablet Take 1 tablet (10 mg total) by mouth daily. Patient taking differently: Take 5 mg by mouth daily.  07/12/19   Elvina Sidle, MD  lisinopril (ZESTRIL) 20 MG tablet Take 20 mg by mouth at bedtime. 02/24/20   [provider]  LORazepam (ATIVAN) 0.5 MG tablet Take 0.5 mg by mouth daily as needed for anxiety.    [provider]  lovastatin (MEVACOR) 10 MG tablet Take 10 mg by mouth at bedtime. 02/24/20   [provider]  neomycin-polymyxin b-dexamethasone (MAXITROL) 3.5-10000-0.1 OINT APPLY A SMALL AMOUNT IN THE CONJUNCTIVAL SAC IN LEFT EYE TWICE DAILY 12/11/19   [provider]  promethazine (PHENERGAN) 25 MG tablet Take 25 mg by mouth every 8 (eight) hours as needed. 02/24/20   [provider]  omeprazole (PRILOSEC) 40 MG capsule Take 1 capsule (40 mg total) by mouth daily. 12/19/18 03/02/19  McDonald, Mia A, PA-C  sucralfate (CARAFATE) 1 GM/10ML suspension Take 10 mLs (1 g total) by mouth 4 (four) times daily -  with meals and at bedtime. 12/22/18 03/02/19  Emi Holes, PA-C    Family History Family History  Problem Relation Age of Onset  . Hypertension Mother   . Hypertension Father   . Colon cancer Neg Hx   . Colon polyps Neg Hx   . Esophageal cancer Neg Hx   . Rectal cancer Neg Hx   . Stomach cancer Neg Hx     Social History Social History   Tobacco Use  . Smoking status: Former Games developer  . Smokeless tobacco: Never Used  Vaping Use  . Vaping Use: Never used  Substance Use Topics  . Alcohol use: Yes    Comment: socially  . Drug use: Not Currently     Allergies   Ibuprofen, Other, and Zofran [ondansetron hcl]   Review of Systems Review of Systems  Constitutional: Negative.   Gastrointestinal: Negative.   Negative for nausea and vomiting.  Musculoskeletal: Negative.  Negative for arthralgias and myalgias.  Neurological: Positive for dizziness. Negative for facial asymmetry, speech difficulty, weakness, light-headedness and headaches.  Psychiatric/Behavioral: Negative for confusion and decreased concentration.     Physical Exam Triage Vital Signs ED Triage Vitals  Enc Vitals Group     BP 03/25/20 2048 (!) 138/93     Pulse Rate 03/25/20 2048 80     Resp 03/25/20 2048 17     Temp 03/25/20 2048 98.9 F (37.2 C)     Temp Source 03/25/20 2048 Oral     SpO2 03/25/20 2048 98 %     Weight --      Height --      Head Circumference --      Peak Flow --      Pain Score 03/25/20 2036 0     Pain Loc --      Pain Edu? --      Excl. in GC? --    No data found.  Updated Vital Signs BP (!) 138/93 (BP Location: Right Arm)   Pulse 80   Temp 98.9 F (37.2 C) (Oral)   Resp 17   SpO2 98%   Visual Acuity Right Eye Distance:   Left Eye Distance:   Bilateral Distance:    Right Eye Near:   Left Eye Near:    Bilateral Near:     Physical Exam Vitals and nursing note reviewed.  Constitutional:      General: He is not in acute distress. HENT:     Right Ear: Tympanic membrane normal.     Left Ear: Tympanic membrane normal.  Cardiovascular:     Rate and Rhythm: Normal rate and regular rhythm.     Pulses: Normal pulses.     Heart sounds: Normal heart sounds.  Pulmonary:     Effort: Pulmonary effort is normal.     Breath sounds: Normal breath sounds.  Abdominal:     General: Bowel sounds are normal. There is no distension.     Palpations: Abdomen is soft.     Hernia: No hernia is present.  Neurological:     Mental Status: He is alert.      UC Treatments / Results  Labs (all labs ordered are listed, but only abnormal results are displayed) Labs Reviewed  VITAMIN D 25 HYDROXY (VIT D DEFICIENCY, FRACTURES) - Abnormal; Notable for the following components:      Result Value   Vit  D, 25-Hydroxy 10.12 (*)    All other components within normal limits    EKG   Radiology No results found.  Procedures Procedures (including critical  care time)  Medications Ordered in UC Medications - No data to display  Initial Impression / Assessment and Plan / UC Course  I have reviewed the triage vital signs and the nursing notes.  Pertinent labs & imaging results that were available during my care of the patient were reviewed by me and considered in my medical decision making (see chart for details).     1.  Dizziness, etiology unclear: Patient has no lateralizing signs.  Review of his labs from 2018 reviewed vitamin D level today 19.7. Clinical examination was nonfocal or any neurologic signs.  Patient is encouraged to be compliant with his medications. Vitamin D level is low patient will be started on vitamin D supplementation.   Final Clinical Impressions(s) / UC Diagnoses   Final diagnoses:  Dizziness   Discharge Instructions   None    ED Prescriptions    None     PDMP not reviewed this encounter.   Merrilee Jansky, MD 03/27/20 1205

## 2020-03-29 ENCOUNTER — Telehealth (HOSPITAL_COMMUNITY): Payer: Self-pay

## 2020-03-31 ENCOUNTER — Telehealth (HOSPITAL_COMMUNITY): Payer: Self-pay

## 2020-03-31 DIAGNOSIS — E559 Vitamin D deficiency, unspecified: Secondary | ICD-10-CM

## 2020-03-31 MED ORDER — VITAMIN D (ERGOCALCIFEROL) 1.25 MG (50000 UNIT) PO CAPS: 50000.0000 [IU] | ORAL_CAPSULE | ORAL | 0 refills | Status: DC

## 2020-03-31 NOTE — Telephone Encounter (Signed)
Vit D 10.12 Reviewed by Despina Arias, NP.  Rx sent for Vit D 50,000 IU once weekly x 8 weeks.  Pt advised and also advised to f/u with PCP for continued monitoring.

## 2020-04-01 ENCOUNTER — Encounter: Payer: Self-pay | Admitting: General Surgery

## 2020-04-02 ENCOUNTER — Ambulatory Visit: Payer: Self-pay | Admitting: Nurse Practitioner

## 2020-04-12 ENCOUNTER — Emergency Department (HOSPITAL_COMMUNITY)
Admission: EM | Admit: 2020-04-12 | Discharge: 2020-04-12 | Disposition: A | Discharge status: Home / Self Care | Payer: Self-pay | Attending: Emergency Medicine | Admitting: Emergency Medicine

## 2020-04-12 ENCOUNTER — Ambulatory Visit (HOSPITAL_COMMUNITY)
Admission: EM | Admit: 2020-04-12 | Discharge: 2020-04-12 | Disposition: A | Discharge status: Home / Self Care | Payer: Self-pay

## 2020-04-12 ENCOUNTER — Other Ambulatory Visit: Payer: Self-pay

## 2020-04-12 ENCOUNTER — Encounter (HOSPITAL_COMMUNITY): Payer: Self-pay | Admitting: *Deleted

## 2020-04-12 DIAGNOSIS — Z5321 Procedure and treatment not carried out due to patient leaving prior to being seen by health care provider: Secondary | ICD-10-CM | POA: Insufficient documentation

## 2020-04-12 DIAGNOSIS — I10 Essential (primary) hypertension: Secondary | ICD-10-CM | POA: Insufficient documentation

## 2020-04-12 NOTE — ED Triage Notes (Signed)
The pt is here because he checked his bp at [employer] and it was high  He takes bp med and has a doctor  He is always coming in with this complaint

## 2020-04-12 NOTE — ED Notes (Addendum)
Pt didn't respond in lobby when called

## 2020-06-21 ENCOUNTER — Encounter (HOSPITAL_COMMUNITY): Payer: Self-pay | Admitting: Emergency Medicine

## 2020-06-21 ENCOUNTER — Emergency Department (HOSPITAL_COMMUNITY)
Admission: EM | Admit: 2020-06-21 | Discharge: 2020-06-21 | Disposition: A | Discharge status: Home / Self Care | Payer: Self-pay | Attending: Emergency Medicine | Admitting: Emergency Medicine

## 2020-06-21 DIAGNOSIS — Z5321 Procedure and treatment not carried out due to patient leaving prior to being seen by health care provider: Secondary | ICD-10-CM | POA: Insufficient documentation

## 2020-06-21 DIAGNOSIS — I10 Essential (primary) hypertension: Secondary | ICD-10-CM | POA: Insufficient documentation

## 2020-06-21 NOTE — ED Triage Notes (Signed)
54 year-old male presents today with chronic and ongoing issue of hypertension. Pt has been seen muitlple times for this issue and does have a care plan in place.  Pt states he forgot to take BP medications last night but he did take his medications just prior to coming in to ED. Pt has no shortness of breath or Chest pain.

## 2020-06-21 NOTE — ED Notes (Signed)
Called pt for vitals no answer 

## 2020-06-21 NOTE — ED Notes (Signed)
Called for Patient No answer.

## 2020-07-26 ENCOUNTER — Other Ambulatory Visit: Payer: Self-pay | Admitting: Gastroenterology

## 2020-07-26 DIAGNOSIS — R1011 Right upper quadrant pain: Secondary | ICD-10-CM

## 2020-09-02 ENCOUNTER — Other Ambulatory Visit: Payer: Self-pay

## 2020-10-22 ENCOUNTER — Encounter (HOSPITAL_COMMUNITY): Payer: Self-pay

## 2020-10-22 ENCOUNTER — Other Ambulatory Visit: Payer: Self-pay

## 2020-10-22 ENCOUNTER — Ambulatory Visit (HOSPITAL_COMMUNITY)
Admission: EM | Admit: 2020-10-22 | Discharge: 2020-10-22 | Disposition: A | Discharge status: Home / Self Care | Payer: Self-pay | Attending: Student | Admitting: Student

## 2020-10-22 DIAGNOSIS — B372 Candidiasis of skin and nail: Secondary | ICD-10-CM

## 2020-10-22 DIAGNOSIS — K219 Gastro-esophageal reflux disease without esophagitis: Secondary | ICD-10-CM

## 2020-10-22 DIAGNOSIS — J069 Acute upper respiratory infection, unspecified: Secondary | ICD-10-CM

## 2020-10-22 MED ORDER — NYSTATIN 100000 UNIT/GM EX POWD
1.0000 | Freq: Two times a day (BID) | CUTANEOUS | 0 refills | Status: DC
Start: 2020-10-22 — End: 2021-09-25

## 2020-10-22 MED ORDER — NYSTATIN 100000 UNIT/GM EX CREA
TOPICAL_CREAM | CUTANEOUS | 0 refills | Status: DC
Start: 2020-10-22 — End: 2021-09-25

## 2020-10-22 MED ORDER — ALBUTEROL SULFATE HFA 108 (90 BASE) MCG/ACT IN AERS: 1.0000 | INHALATION_SPRAY | Freq: Four times a day (QID) | RESPIRATORY_TRACT | 0 refills | Status: DC | PRN

## 2020-10-22 MED ORDER — OMEPRAZOLE 20 MG PO CPDR
20.0000 mg | DELAYED_RELEASE_CAPSULE | Freq: Every day | ORAL | 0 refills | Status: DC
Start: 2020-10-22 — End: 2021-02-26

## 2020-10-22 MED ORDER — PROMETHAZINE-DM 6.25-15 MG/5ML PO SYRP
5.0000 mL | ORAL_SOLUTION | Freq: Four times a day (QID) | ORAL | 0 refills | Status: DC | PRN
Start: 2020-10-22 — End: 2021-11-18

## 2020-10-22 MED ORDER — BENZONATATE 100 MG PO CAPS
100.0000 mg | ORAL_CAPSULE | Freq: Three times a day (TID) | ORAL | 0 refills | Status: DC
Start: 2020-10-22 — End: 2021-01-31

## 2020-10-22 NOTE — ED Provider Notes (Signed)
[facility]    CSN: [id number] Arrival date & time: 10/22/20  1559      History   Chief Complaint Chief Complaint  Patient presents with  . Cough    HPI Wesley [name] is a 55 y.o. male presenting for multiple complaints.  History of alcohol abuse, anxiety, arthritis, neck pain, cocaine abuse, dizziness, GERD, high cholesterol, hypertension, prediabetes. -Patient presented with dry cough for 2 days.  Denies history of cardiopulmonary disease, though states his mom died of pulmonary fibrosis.  Denies lung issues prior to this.  Denies fevers/chills, n/v/d, shortness of breath, chest pain, congestion, facial pain, teeth pain, headaches, sore throat, loss of taste/smell, swollen lymph nodes, ear pain.  States he already took a Covid test, but this is pending. -He also endorses a rash under his stomach for 8 months.  States that this is very itchy particularly when he is sweating.  Denies rash elsewhere, denies fever/chills.  Denies discharge. -He endorses history of GERD/possible peptic ulcer, previously well controlled on esomeprazole.  Been off of this medication, and endorses gnawing pain with empty stomach.  Requesting medication to treat this. Denies n/v/d/c. abd pain is relieved by eating.    HPI  Past Medical History:  Diagnosis Date  . Alcohol abuse   . Anxiety   . Anxiety   . Arthritis   . Chronic neck pain   . Cocaine abuse (HCC)   . Dizziness   . GERD (gastroesophageal reflux disease)   . Hypercholesteremia   . Hypertension   . Prediabetes     Patient Active Problem List   Diagnosis Date Noted  . Legally blind in right eye, as defined in [place] 05/02/2017  . Prediabetes 04/23/2017  . Muscle spasm 04/23/2017  . Essential hypertension 04/16/2017  . H/O medication noncompliance 04/02/2017  . Vitamin D deficiency 04/02/2017  . DJD (degenerative joint disease) of cervical spine 03/16/2017  . 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 Dizziness 01/23/2017  . Anxiety  11/21/2016    Past Surgical History:  Procedure Laterality Date  . EYE SURGERY Right ~ 1980   "hit my eye"       Home Medications    Prior to Admission medications   Medication Sig Start Date End Date Taking? Authorizing Provider  albuterol (VENTOLIN HFA) 108 (90 Base) MCG/ACT inhaler Inhale 1-2 puffs into the lungs every 6 (six) hours as needed for wheezing or shortness of breath. 10/22/20  Yes Rhys Martini, PA-C  benzonatate (TESSALON) 100 MG capsule Take 1 capsule (100 mg total) by mouth every 8 (eight) hours. 10/22/20  Yes Rhys Martini, PA-C  nystatin (MYCOSTATIN/NYSTOP) powder Apply 1 application topically 2 (two) times daily. 10/22/20  Yes Rhys Martini, PA-C  nystatin cream (MYCOSTATIN) Apply to affected area 2 times daily 10/22/20  Yes Rhys Martini, PA-C  omeprazole (PRILOSEC) 20 MG capsule Take 1 capsule (20 mg total) by mouth daily. 10/22/20 11/21/20 Yes Rhys Martini, PA-C  promethazine-dextromethorphan (PROMETHAZINE-DM) 6.25-15 MG/5ML syrup Take 5 mLs by mouth 4 (four) times daily as needed for cough. 10/22/20  Yes Rhys Martini, PA-C  acetaminophen (TYLENOL) 500 MG tablet Take 1 tablet (500 mg total) by mouth every 6 (six) hours as needed. 12/19/18   Wallis Bamberg, PA-C  amLODipine (NORVASC) 5 MG tablet Take 1 tablet (5 mg total) by mouth daily. 07/12/19   Elvina Sidle, MD  lisinopril (ZESTRIL) 10 MG tablet Take 1 tablet (10 mg total) by mouth daily. Patient taking differently: Take 5 mg by  mouth daily.  07/12/19   Elvina Sidle, MD  lisinopril (ZESTRIL) 20 MG tablet Take 20 mg by mouth at bedtime. 02/24/20   [provider]  LORazepam (ATIVAN) 0.5 MG tablet Take 0.5 mg by mouth daily as needed for anxiety.    [provider]  lovastatin (MEVACOR) 10 MG tablet Take 10 mg by mouth at bedtime. 02/24/20   [provider]  neomycin-polymyxin b-dexamethasone (MAXITROL) 3.5-10000-0.1 OINT APPLY A SMALL AMOUNT IN THE CONJUNCTIVAL SAC IN LEFT EYE  TWICE DAILY 12/11/19   [provider]  promethazine (PHENERGAN) 25 MG tablet Take 25 mg by mouth every 8 (eight) hours as needed. 02/24/20   [provider]  Vitamin D, Ergocalciferol, (DRISDOL) 1.25 MG (50000 UNIT) CAPS capsule Take 1 capsule (50,000 Units total) by mouth every 7 (seven) days. 03/31/20   Dahlia Byes A, NP  esomeprazole (NEXIUM) 20 MG capsule Take 20 mg by mouth daily. 02/24/20 10/22/20  [provider]  famotidine (PEPCID) 20 MG tablet Take 1 tablet (20 mg total) by mouth 2 (two) times daily. 03/07/20 10/22/20  Wallis Bamberg, PA-C  sucralfate (CARAFATE) 1 GM/10ML suspension Take 10 mLs (1 g total) by mouth 4 (four) times daily -  with meals and at bedtime. 12/22/18 03/02/19  Emi Holes, PA-C    Family History Family History  Problem Relation Age of Onset  . Hypertension Mother   . Hypertension Father   . Colon cancer Neg Hx   . Colon polyps Neg Hx   . Esophageal cancer Neg Hx   . Rectal cancer Neg Hx   . Stomach cancer Neg Hx     Social History Social History   Tobacco Use  . Smoking status: Former Games developer  . Smokeless tobacco: Never Used  Vaping Use  . Vaping Use: Never used  Substance Use Topics  . Alcohol use: Yes    Comment: socially  . Drug use: Not Currently     Allergies   Ibuprofen, Other, and Zofran [ondansetron hcl]   Review of Systems Review of Systems  Constitutional: Negative for appetite change, chills and fever.  HENT: Negative for congestion, ear pain, rhinorrhea, sinus pressure, sinus pain and sore throat.   Eyes: Negative for redness and visual disturbance.  Respiratory: Positive for cough. Negative for chest tightness, shortness of breath and wheezing.   Cardiovascular: Negative for chest pain and palpitations.  Gastrointestinal: Positive for abdominal pain. Negative for constipation, diarrhea, nausea and vomiting.  Genitourinary: Negative for dysuria, frequency and urgency.  Musculoskeletal: Negative for  myalgias.  Skin: Positive for rash.  Neurological: Negative for dizziness, weakness and headaches.  Psychiatric/Behavioral: Negative for confusion.  All other systems reviewed and are negative.    Physical Exam Triage Vital Signs ED Triage Vitals  Enc Vitals Group     BP 10/22/20 1627 (!) 143/97     Pulse Rate 10/22/20 1627 92     Resp 10/22/20 1627 19     Temp 10/22/20 1627 98.2 F (36.8 C)     Temp src --      SpO2 10/22/20 1627 98 %     Weight --      Height --      Head Circumference --      Peak Flow --      Pain Score 10/22/20 1625 0     Pain Loc --      Pain Edu? --      Excl. in GC? --    No data found.  Updated Vital Signs BP (!) 143/97   Pulse 92   Temp 98.2 F (36.8 C)   Resp 19   SpO2 98%   Visual Acuity Right Eye Distance:   Left Eye Distance:   Bilateral Distance:    Right Eye Near:   Left Eye Near:    Bilateral Near:     Physical Exam Vitals reviewed.  Constitutional:      General: He is not in acute distress.    Appearance: Normal appearance. He is not ill-appearing.  HENT:     Head: Normocephalic and atraumatic.     Right Ear: Hearing, tympanic membrane, ear canal and external ear normal. No swelling or tenderness. There is no impacted cerumen. No mastoid tenderness. Tympanic membrane is not perforated, erythematous, retracted or bulging.     Left Ear: Hearing, tympanic membrane, ear canal and external ear normal. No swelling or tenderness. There is no impacted cerumen. No mastoid tenderness. Tympanic membrane is not perforated, erythematous, retracted or bulging.     Nose:     Right Sinus: No maxillary sinus tenderness or frontal sinus tenderness.     Left Sinus: No maxillary sinus tenderness or frontal sinus tenderness.     Mouth/Throat:     Mouth: Mucous membranes are moist.     Pharynx: Uvula midline. No oropharyngeal exudate or posterior oropharyngeal erythema.     Tonsils: No tonsillar exudate.  Cardiovascular:     Rate and  Rhythm: Normal rate and regular rhythm.     Heart sounds: Normal heart sounds.  Pulmonary:     Breath sounds: Normal breath sounds and air entry. No wheezing, rhonchi or rales.  Chest:     Chest wall: No tenderness.  Abdominal:     General: Abdomen is flat. Bowel sounds are normal.     Tenderness: There is abdominal tenderness in the epigastric area. There is no guarding or rebound. Negative signs include Murphy's sign, Rovsing's sign and McBurney's sign.  Lymphadenopathy:     Cervical: No cervical adenopathy.  Skin:    Comments: Beneath pannus: beefy erythematous rash.   Neurological:     General: No focal deficit present.     Mental Status: He is alert and oriented to person, place, and time.  Psychiatric:        Attention and Perception: Attention and perception normal.        Mood and Affect: Mood and affect normal.        Behavior: Behavior normal. Behavior is cooperative.        Thought Content: Thought content normal.        Judgment: Judgment normal.      UC Treatments / Results  Labs (all labs ordered are listed, but only abnormal results are displayed) Labs Reviewed - No data to display  EKG   Radiology No results found.  Procedures Procedures (including critical care time)  Medications Ordered in UC Medications - No data to display  Initial Impression / Assessment and Plan / UC Course  I have reviewed the triage vital signs and the nursing notes.  Pertinent labs & imaging results that were available during my care of the patient were reviewed by me and considered in my medical decision making (see chart for details).     For cough: albuterol, tessalon, promethazine DM as below. Covid test is pending, he declines additional test today.  For yeast dermatitis: nystatin as below.   For GERD, prilosec as below.   Return precautions- chest pain, shortness of  breath, new/worsening fevers/chills, confusion, worsening of symptoms despite the above treatment  plan, etc.   Spent over 40 minutes obtaining H&P, performing physical, discussing results, treatment plan and plan for follow-up with patient. Patient agrees with plan.   This chart was dictated using voice recognition software, Dragon. Despite the best efforts of this provider to proofread and correct errors, errors may still occur which can change documentation meaning.  Final Clinical Impressions(s) / UC Diagnoses   Final diagnoses:  Viral URI with cough  Candidal dermatitis  Gastroesophageal reflux disease without esophagitis     Discharge Instructions     For your cough, use these medications:  -Albuterol inhaler as needed for cough and shortness of breath -Tessalon Perles (Benzonatate) as needed up to 3x daily for cough. This shouldn't make you drowsy.  -Promethazine DM cough syrup for congestion/cough. This could make you drowsy, so take at night before bed.  For your yeast rash, apply nystatin cream, followed by nystatin powder. Do this twice daily.   For your ulcer/acid reflux, use the Prilosec once daily in the morning or evening. I sent a 30 day prescription of this.    ED Prescriptions    Medication Sig Dispense Auth. Provider   nystatin cream (MYCOSTATIN) Apply to affected area 2 times daily 30 g Rhys Martini, PA-C   nystatin (MYCOSTATIN/NYSTOP) powder Apply 1 application topically 2 (two) times daily. 30 g Rhys Martini, PA-C   benzonatate (TESSALON) 100 MG capsule Take 1 capsule (100 mg total) by mouth every 8 (eight) hours. 21 capsule Rhys Martini, PA-C   promethazine-dextromethorphan (PROMETHAZINE-DM) 6.25-15 MG/5ML syrup Take 5 mLs by mouth 4 (four) times daily as needed for cough. 118 mL Rhys Martini, PA-C   albuterol (VENTOLIN HFA) 108 (90 Base) MCG/ACT inhaler Inhale 1-2 puffs into the lungs every 6 (six) hours as needed for wheezing or shortness of breath. 1 each Rhys Martini, PA-C   omeprazole (PRILOSEC) 20 MG capsule Take 1 capsule (20 mg total)  by mouth daily. 30 capsule Rhys Martini, PA-C     PDMP not reviewed this encounter.   Rhys Martini, PA-C 10/22/20 1720

## 2020-10-22 NOTE — Discharge Instructions (Addendum)
For your cough, use these medications:  -Albuterol inhaler as needed for cough and shortness of breath -Tessalon Perles (Benzonatate) as needed up to 3x daily for cough. This shouldn't make you drowsy.  -Promethazine DM cough syrup for congestion/cough. This could make you drowsy, so take at night before bed.  For your yeast rash, apply nystatin cream, followed by nystatin powder. Do this twice daily.   For your ulcer/acid reflux, use the Prilosec once daily in the morning or evening. I sent a 30 day prescription of this.

## 2020-10-22 NOTE — ED Triage Notes (Addendum)
Pt in with c/o dry cough that has been going on for 2 days now. Pt had covid test yesterday, results pending.  Pt states he has been taking cough medicine with no relief  Denies any other uri sxs  Also c/o rash on lower abdomen that has been there for 8 months that he would like evaluated

## 2020-12-27 ENCOUNTER — Other Ambulatory Visit: Payer: Self-pay

## 2020-12-27 ENCOUNTER — Ambulatory Visit (HOSPITAL_COMMUNITY)
Admission: EM | Admit: 2020-12-27 | Discharge: 2020-12-27 | Disposition: A | Discharge status: Home / Self Care | Payer: Self-pay | Attending: Emergency Medicine | Admitting: Emergency Medicine

## 2020-12-27 DIAGNOSIS — L209 Atopic dermatitis, unspecified: Secondary | ICD-10-CM

## 2020-12-27 MED ORDER — ZINC OXIDE 12.8 % EX OINT: 1.0000 "application " | TOPICAL_OINTMENT | CUTANEOUS | 0 refills | Status: DC | PRN

## 2020-12-27 MED ORDER — FLUOCINOLONE ACETONIDE 0.025 % EX OINT: TOPICAL_OINTMENT | Freq: Two times a day (BID) | CUTANEOUS | 0 refills | Status: DC

## 2020-12-27 NOTE — Discharge Instructions (Signed)
Use layer of fluocinolone cream on dry areas then apply paste over area twice a day  Use petroleum or lotion over all skin daily, do not allow skin to become dry  For itching can use over the counter antihistamine such as benadryl, Claritin, zyrtec  Follow up with skin doctor in the 3-4 weeks so they can recheck your skin

## 2020-12-27 NOTE — ED Provider Notes (Signed)
[facility]    CSN: [id number] Arrival date & time: 12/27/20  1008      History   Chief Complaint Chief Complaint  Patient presents with  . Pruritis  . Eczema    HPI Wesley [name] is a 55 y.o. male.   Patient presents with skin rash on bilateral elbows, forearm and on lower abdomen skin fold. Dry, flaky and itchy. Has been scratching area, worse in evening. Seen by dermatologist, used halobetasol for a few weeks with no relief. Given nystatin powder and cream to try on abdomen with no relief. Denies changes with toiletries, detergents, soaps.   Past Medical History:  Diagnosis Date  . Alcohol abuse   . Anxiety   . Anxiety   . Arthritis   . Chronic neck pain   . Cocaine abuse (HCC)   . Dizziness   . GERD (gastroesophageal reflux disease)   . Hypercholesteremia   . Hypertension   . Prediabetes     Patient Active Problem List   Diagnosis Date Noted  . Legally blind in right eye, as defined in [place] 05/02/2017  . Prediabetes 04/23/2017  . Muscle spasm 04/23/2017  . Essential hypertension 04/16/2017  . H/O medication noncompliance 04/02/2017  . Vitamin D deficiency 04/02/2017  . DJD (degenerative joint disease) of cervical spine 03/16/2017  . 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 Dizziness 01/23/2017  . Anxiety 11/21/2016    Past Surgical History:  Procedure Laterality Date  . EYE SURGERY Right ~ 1980   "hit my eye"       Home Medications    Prior to Admission medications   Medication Sig Start Date End Date Taking? Authorizing Provider  fluocinolone (SYNALAR) 0.025 % ointment Apply topically 2 (two) times daily. 12/27/20  Yes Sonnie Pawloski, Elita Boone, NP  Zinc Oxide (TRIPLE PASTE) 12.8 % ointment Apply 1 application topically as needed for irritation. 12/27/20  Yes Wanya Bangura, Elita Boone, NP  acetaminophen (TYLENOL) 500 MG tablet Take 1 tablet (500 mg total) by mouth every 6 (six) hours as needed. 12/19/18   Wallis Bamberg, PA-C  albuterol (VENTOLIN HFA) 108 (90 Base) MCG/ACT  inhaler Inhale 1-2 puffs into the lungs every 6 (six) hours as needed for wheezing or shortness of breath. 10/22/20   Rhys Martini, PA-C  amLODipine (NORVASC) 5 MG tablet Take 1 tablet (5 mg total) by mouth daily. 07/12/19   Elvina Sidle, MD  benzonatate (TESSALON) 100 MG capsule Take 1 capsule (100 mg total) by mouth every 8 (eight) hours. 10/22/20   Rhys Martini, PA-C  lisinopril (ZESTRIL) 10 MG tablet Take 1 tablet (10 mg total) by mouth daily. Patient taking differently: Take 5 mg by mouth daily.  07/12/19   Elvina Sidle, MD  lisinopril (ZESTRIL) 20 MG tablet Take 20 mg by mouth at bedtime. 02/24/20   [provider]  LORazepam (ATIVAN) 0.5 MG tablet Take 0.5 mg by mouth daily as needed for anxiety.    [provider]  lovastatin (MEVACOR) 10 MG tablet Take 10 mg by mouth at bedtime. 02/24/20   [provider]  neomycin-polymyxin b-dexamethasone (MAXITROL) 3.5-10000-0.1 OINT APPLY A SMALL AMOUNT IN THE CONJUNCTIVAL SAC IN LEFT EYE TWICE DAILY 12/11/19   [provider]  nystatin (MYCOSTATIN/NYSTOP) powder Apply 1 application topically 2 (two) times daily. 10/22/20   Rhys Martini, PA-C  nystatin cream (MYCOSTATIN) Apply to affected area 2 times daily 10/22/20   Rhys Martini, PA-C  omeprazole (PRILOSEC) 20 MG capsule Take 1 capsule (20 mg total)  by mouth daily. 10/22/20 11/21/20  Rhys Martini, PA-C  promethazine (PHENERGAN) 25 MG tablet Take 25 mg by mouth every 8 (eight) hours as needed. 02/24/20   [provider]  promethazine-dextromethorphan (PROMETHAZINE-DM) 6.25-15 MG/5ML syrup Take 5 mLs by mouth 4 (four) times daily as needed for cough. 10/22/20   Rhys Martini, PA-C  Vitamin D, Ergocalciferol, (DRISDOL) 1.25 MG (50000 UNIT) CAPS capsule Take 1 capsule (50,000 Units total) by mouth every 7 (seven) days. 03/31/20   Dahlia Byes A, NP  esomeprazole (NEXIUM) 20 MG capsule Take 20 mg by mouth daily. 02/24/20 10/22/20  [provider]   famotidine (PEPCID) 20 MG tablet Take 1 tablet (20 mg total) by mouth 2 (two) times daily. 03/07/20 10/22/20  Wallis Bamberg, PA-C  sucralfate (CARAFATE) 1 GM/10ML suspension Take 10 mLs (1 g total) by mouth 4 (four) times daily -  with meals and at bedtime. 12/22/18 03/02/19  Emi Holes, PA-C    Family History Family History  Problem Relation Age of Onset  . Hypertension Mother   . Hypertension Father   . Colon cancer Neg Hx   . Colon polyps Neg Hx   . Esophageal cancer Neg Hx   . Rectal cancer Neg Hx   . Stomach cancer Neg Hx     Social History Social History   Tobacco Use  . Smoking status: Former Games developer  . Smokeless tobacco: Never Used  Vaping Use  . Vaping Use: Never used  Substance Use Topics  . Alcohol use: Yes    Comment: socially  . Drug use: Not Currently     Allergies   Ibuprofen, Other, and Zofran [ondansetron hcl]   Review of Systems Review of Systems  Constitutional: Negative.   Respiratory: Negative.   Cardiovascular: Negative.   Skin: Positive for rash. Negative for color change, pallor and wound.     Physical Exam Triage Vital Signs ED Triage Vitals  Enc Vitals Group     BP 12/27/20 1037 124/87     Pulse Rate 12/27/20 1037 91     Resp 12/27/20 1037 (!) 8     Temp 12/27/20 1037 98.3 F (36.8 C)     Temp Source 12/27/20 1037 Oral     SpO2 12/27/20 1037 95 %     Weight --      Height --      Head Circumference --      Peak Flow --      Pain Score 12/27/20 1039 0     Pain Loc --      Pain Edu? --      Excl. in GC? --    No data found.  Updated Vital Signs BP 124/87 (BP Location: Right Arm)   Pulse 91   Temp 98.3 F (36.8 C) (Oral)   Resp (!) 8   SpO2 95%   Visual Acuity Right Eye Distance:   Left Eye Distance:   Bilateral Distance:    Right Eye Near:   Left Eye Near:    Bilateral Near:     Physical Exam Constitutional:      Appearance: Normal appearance. He is normal weight.  HENT:     Head: Normocephalic.   Pulmonary:     Effort: Pulmonary effort is normal.  Musculoskeletal:        General: Normal range of motion.  Skin:         Comments: Dry flaky papular rash over bilateral elbow and forearm, dry flaky rash over lower  abdomen, excoriation present  Neurological:     Mental Status: He is alert and oriented to person, place, and time. Mental status is at baseline.  Psychiatric:        Mood and Affect: Mood normal.        Behavior: Behavior normal.        Thought Content: Thought content normal.        Judgment: Judgment normal.      UC Treatments / Results  Labs (all labs ordered are listed, but only abnormal results are displayed) Labs Reviewed - No data to display  EKG   Radiology No results found.  Procedures Procedures (including critical care time)  Medications Ordered in UC Medications - No data to display  Initial Impression / Assessment and Plan / UC Course  I have reviewed the triage vital signs and the nursing notes.  Pertinent labs & imaging results that were available during my care of the patient were reviewed by me and considered in my medical decision making (see chart for details).  Atopic dermatitis  1. Fluocinolone 0.25% bid on affected area 2. Zinc oxide paste bid on affected areas 3. Follow up with dermatologist 3-4 weeks for reevaluation  Final Clinical Impressions(s) / UC Diagnoses   Final diagnoses:  Atopic dermatitis, unspecified type     Discharge Instructions     Use layer of fluocinolone cream on dry areas then apply paste over area twice a day  Use petroleum or lotion over all skin daily, do not allow skin to become dry  For itching can use over the counter antihistamine such as benadryl, Claritin, zyrtec  Follow up with skin doctor in the 3-4 weeks so they can recheck your skin    ED Prescriptions    Medication Sig Dispense Auth. Provider   fluocinolone (SYNALAR) 0.025 % ointment Apply topically 2 (two) times daily. 30 g  Salli Quarry R, NP   Zinc Oxide (TRIPLE PASTE) 12.8 % ointment Apply 1 application topically as needed for irritation. 56.7 g Valinda Hoar, NP     PDMP not reviewed this encounter.   Valinda Hoar, NP 12/27/20 1130

## 2020-12-27 NOTE — ED Triage Notes (Signed)
PT has dry patches of skin that itch on Lt arm

## 2021-01-31 ENCOUNTER — Other Ambulatory Visit: Payer: Self-pay

## 2021-01-31 ENCOUNTER — Encounter (HOSPITAL_COMMUNITY): Payer: Self-pay

## 2021-01-31 ENCOUNTER — Ambulatory Visit (HOSPITAL_COMMUNITY)
Admission: EM | Admit: 2021-01-31 | Discharge: 2021-01-31 | Disposition: A | Discharge status: Home / Self Care | Payer: Self-pay | Attending: Physician Assistant | Admitting: Physician Assistant

## 2021-01-31 ENCOUNTER — Ambulatory Visit (HOSPITAL_COMMUNITY)
Admission: EM | Admit: 2021-01-31 | Discharge: 2021-01-31 | Disposition: A | Discharge status: Home / Self Care | Payer: Self-pay

## 2021-01-31 DIAGNOSIS — Z87891 Personal history of nicotine dependence: Secondary | ICD-10-CM | POA: Insufficient documentation

## 2021-01-31 DIAGNOSIS — Z886 Allergy status to analgesic agent status: Secondary | ICD-10-CM | POA: Insufficient documentation

## 2021-01-31 DIAGNOSIS — Z79899 Other long term (current) drug therapy: Secondary | ICD-10-CM | POA: Insufficient documentation

## 2021-01-31 DIAGNOSIS — Z20822 Contact with and (suspected) exposure to covid-19: Secondary | ICD-10-CM | POA: Insufficient documentation

## 2021-01-31 DIAGNOSIS — J069 Acute upper respiratory infection, unspecified: Secondary | ICD-10-CM

## 2021-01-31 DIAGNOSIS — Z888 Allergy status to other drugs, medicaments and biological substances status: Secondary | ICD-10-CM | POA: Insufficient documentation

## 2021-01-31 MED ORDER — BENZONATATE 200 MG PO CAPS: 200.0000 mg | ORAL_CAPSULE | Freq: Three times a day (TID) | ORAL | 0 refills | Status: DC | PRN

## 2021-01-31 MED ORDER — IPRATROPIUM BROMIDE 0.06 % NA SOLN: 2.0000 | Freq: Four times a day (QID) | NASAL | 0 refills | Status: DC

## 2021-01-31 NOTE — ED Notes (Signed)
CALLED PT 3 X IN LOBBY NO RESPONSE.

## 2021-01-31 NOTE — ED Triage Notes (Signed)
Pt c/o a cough and wheezing x 4 days. He denies fever. Pt states he has pain when he coughs.

## 2021-01-31 NOTE — Discharge Instructions (Addendum)
COVID PCR testing ordered. I would like you to quarantine until testing results.   Tessalon for cough Atrovent nasal spray for nasal congestion/drainage Tylenol/motrin for pain and fever.  Keep hydrated, urine should be clear to pale yellow in color.   If experiencing shortness of breath, trouble breathing, go to the emergency department for further evaluation needed.

## 2021-01-31 NOTE — ED Provider Notes (Signed)
[facility]    CSN: [id number] Arrival date & time: 01/31/21  1946      History   Chief Complaint Chief Complaint  Patient presents with  . Cough    HPI Wesley [name] is a 55 y.o. male.   55 year old male comes in for reported history of URI symptoms.  Has had cough, postnasal drainage.  Denies rhinorrhea, nasal congestion, sore throat.  Denies fever, chills, body aches.  Denies shortness of breath. States he hears wheezing when coughing. Former smoker.      Past Medical History:  Diagnosis Date  . Alcohol abuse   . Anxiety   . Anxiety   . Arthritis   . Chronic neck pain   . Cocaine abuse (HCC)   . Dizziness   . GERD (gastroesophageal reflux disease)   . Hypercholesteremia   . Hypertension   . Prediabetes     Patient Active Problem List   Diagnosis Date Noted  . Legally blind in right eye, as defined in [place] 05/02/2017  . Prediabetes 04/23/2017  . Muscle spasm 04/23/2017  . Essential hypertension 04/16/2017  . H/O medication noncompliance 04/02/2017  . Vitamin D deficiency 04/02/2017  . DJD (degenerative joint disease) of cervical spine 03/16/2017  . 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 Dizziness 01/23/2017  . Anxiety 11/21/2016    Past Surgical History:  Procedure Laterality Date  . EYE SURGERY Right ~ 1980   "hit my eye"       Home Medications    Prior to Admission medications   Medication Sig Start Date End Date Taking? Authorizing Provider  benzonatate (TESSALON) 200 MG capsule Take 1 capsule (200 mg total) by mouth 3 (three) times daily as needed for cough. 01/31/21  Yes Debra Calabretta V, PA-C  ipratropium (ATROVENT) 0.06 % nasal spray Place 2 sprays into both nostrils 4 (four) times daily. 01/31/21  Yes Merced Hanners V, PA-C  acetaminophen (TYLENOL) 500 MG tablet Take 1 tablet (500 mg total) by mouth every 6 (six) hours as needed. 12/19/18   Wallis Bamberg, PA-C  albuterol (VENTOLIN HFA) 108 (90 Base) MCG/ACT inhaler Inhale 1-2 puffs into the lungs every 6 (six)  hours as needed for wheezing or shortness of breath. 10/22/20   Rhys Martini, PA-C  amLODipine (NORVASC) 5 MG tablet Take 1 tablet (5 mg total) by mouth daily. 07/12/19   Elvina Sidle, MD  fluocinolone (SYNALAR) 0.025 % ointment Apply topically 2 (two) times daily. 12/27/20   White, Elita Boone, NP  lisinopril (ZESTRIL) 10 MG tablet Take 1 tablet (10 mg total) by mouth daily. Patient taking differently: Take 5 mg by mouth daily.  07/12/19   Elvina Sidle, MD  lisinopril (ZESTRIL) 20 MG tablet Take 20 mg by mouth at bedtime. 02/24/20   [provider]  LORazepam (ATIVAN) 0.5 MG tablet Take 0.5 mg by mouth daily as needed for anxiety.    [provider]  lovastatin (MEVACOR) 10 MG tablet Take 10 mg by mouth at bedtime. 02/24/20   [provider]  neomycin-polymyxin b-dexamethasone (MAXITROL) 3.5-10000-0.1 OINT APPLY A SMALL AMOUNT IN THE CONJUNCTIVAL SAC IN LEFT EYE TWICE DAILY 12/11/19   [provider]  nystatin (MYCOSTATIN/NYSTOP) powder Apply 1 application topically 2 (two) times daily. 10/22/20   Rhys Martini, PA-C  nystatin cream (MYCOSTATIN) Apply to affected area 2 times daily 10/22/20   Rhys Martini, PA-C  omeprazole (PRILOSEC) 20 MG capsule Take 1 capsule (20 mg total) by mouth daily. 10/22/20 11/21/20  Rhys Martini,  PA-C  promethazine (PHENERGAN) 25 MG tablet Take 25 mg by mouth every 8 (eight) hours as needed. 02/24/20   [provider]  promethazine-dextromethorphan (PROMETHAZINE-DM) 6.25-15 MG/5ML syrup Take 5 mLs by mouth 4 (four) times daily as needed for cough. 10/22/20   Rhys Martini, PA-C  Vitamin D, Ergocalciferol, (DRISDOL) 1.25 MG (50000 UNIT) CAPS capsule Take 1 capsule (50,000 Units total) by mouth every 7 (seven) days. 03/31/20   Dahlia Byes A, NP  Zinc Oxide (TRIPLE PASTE) 12.8 % ointment Apply 1 application topically as needed for irritation. 12/27/20   Valinda Hoar, NP  esomeprazole (NEXIUM) 20 MG capsule Take 20 mg by  mouth daily. 02/24/20 10/22/20  [provider]  famotidine (PEPCID) 20 MG tablet Take 1 tablet (20 mg total) by mouth 2 (two) times daily. 03/07/20 10/22/20  Wallis Bamberg, PA-C  sucralfate (CARAFATE) 1 GM/10ML suspension Take 10 mLs (1 g total) by mouth 4 (four) times daily -  with meals and at bedtime. 12/22/18 03/02/19  Emi Holes, PA-C    Family History Family History  Problem Relation Age of Onset  . Hypertension Mother   . Hypertension Father   . Colon cancer Neg Hx   . Colon polyps Neg Hx   . Esophageal cancer Neg Hx   . Rectal cancer Neg Hx   . Stomach cancer Neg Hx     Social History Social History   Tobacco Use  . Smoking status: Former Games developer  . Smokeless tobacco: Never Used  Vaping Use  . Vaping Use: Never used  Substance Use Topics  . Alcohol use: Yes    Comment: socially  . Drug use: Not Currently     Allergies   Ibuprofen, Other, and Zofran [ondansetron hcl]   Review of Systems Review of Systems  Reason unable to perform ROS: See HPI as above.     Physical Exam Triage Vital Signs ED Triage Vitals  Enc Vitals Group     BP 01/31/21 2017 (!) 125/93     Pulse Rate 01/31/21 2015 86     Resp 01/31/21 2015 17     Temp 01/31/21 2015 98.1 F (36.7 C)     Temp Source 01/31/21 2015 Oral     SpO2 01/31/21 2015 98 %     Weight --      Height --      Head Circumference --      Peak Flow --      Pain Score 01/31/21 2014 3     Pain Loc --      Pain Edu? --      Excl. in GC? --    No data found.  Updated Vital Signs BP (!) 125/93   Pulse 86   Temp 98.1 F (36.7 C) (Oral)   Resp 17   SpO2 98%   Physical Exam Constitutional:      General: He is not in acute distress.    Appearance: Normal appearance. He is not ill-appearing, toxic-appearing or diaphoretic.  HENT:     Head: Normocephalic and atraumatic.     Mouth/Throat:     Mouth: Mucous membranes are moist.     Pharynx: Oropharynx is clear. Uvula midline.  Cardiovascular:     Rate  and Rhythm: Normal rate and regular rhythm.     Heart sounds: Normal heart sounds. No murmur heard. No friction rub. No gallop.   Pulmonary:     Effort: Pulmonary effort is normal. No accessory muscle usage, prolonged expiration,  respiratory distress or retractions.     Comments: Lungs clear to auscultation without adventitious lung sounds. No wheezing heard with cough.  Musculoskeletal:     Cervical back: Normal range of motion and neck supple.  Neurological:     General: No focal deficit present.     Mental Status: He is alert and oriented to person, place, and time.      UC Treatments / Results  Labs (all labs ordered are listed, but only abnormal results are displayed) Labs Reviewed  SARS CORONAVIRUS 2 (TAT 6-24 HRS)    EKG   Radiology No results found.  Procedures Procedures (including critical care time)  Medications Ordered in UC Medications - No data to display  Initial Impression / Assessment and Plan / UC Course  I have reviewed the triage vital signs and the nursing notes.  Pertinent labs & imaging results that were available during my care of the patient were reviewed by me and considered in my medical decision making (see chart for details).    COVID PCR test ordered. Patient to quarantine until testing results return. No alarming signs on exam. LCTAB. Symptomatic treatment discussed.  Push fluids.  Return precautions given.  Patient expresses understanding and agrees to plan.  Final Clinical Impressions(s) / UC Diagnoses   Final diagnoses:  Viral URI   ED Prescriptions    Medication Sig Dispense Auth. Provider   benzonatate (TESSALON) 200 MG capsule Take 1 capsule (200 mg total) by mouth 3 (three) times daily as needed for cough. 21 capsule Iretha Kirley V, PA-C   ipratropium (ATROVENT) 0.06 % nasal spray Place 2 sprays into both nostrils 4 (four) times daily. 15 mL Belinda Fisher, PA-C     PDMP not reviewed this encounter.   Belinda Fisher, PA-C 01/31/21  2046

## 2021-02-01 LAB — SARS CORONAVIRUS 2 (TAT 6-24 HRS): SARS Coronavirus 2: NEGATIVE

## 2021-02-16 ENCOUNTER — Encounter (HOSPITAL_COMMUNITY): Payer: Self-pay | Admitting: Emergency Medicine

## 2021-02-16 ENCOUNTER — Ambulatory Visit (HOSPITAL_COMMUNITY)
Admission: EM | Admit: 2021-02-16 | Discharge: 2021-02-16 | Disposition: A | Discharge status: Home / Self Care | Payer: Self-pay | Attending: Physician Assistant | Admitting: Physician Assistant

## 2021-02-16 ENCOUNTER — Other Ambulatory Visit: Payer: Self-pay

## 2021-02-16 DIAGNOSIS — F419 Anxiety disorder, unspecified: Secondary | ICD-10-CM | POA: Insufficient documentation

## 2021-02-16 DIAGNOSIS — R1013 Epigastric pain: Secondary | ICD-10-CM | POA: Insufficient documentation

## 2021-02-16 DIAGNOSIS — R110 Nausea: Secondary | ICD-10-CM | POA: Insufficient documentation

## 2021-02-16 DIAGNOSIS — R42 Dizziness and giddiness: Secondary | ICD-10-CM | POA: Insufficient documentation

## 2021-02-16 LAB — POCT URINALYSIS DIPSTICK, ED / UC
Bilirubin Urine: NEGATIVE
Glucose, UA: NEGATIVE mg/dL
Ketones, ur: NEGATIVE mg/dL
Leukocytes,Ua: NEGATIVE
Nitrite: NEGATIVE
Protein, ur: NEGATIVE mg/dL
Specific Gravity, Urine: 1.02 (ref 1.005–1.030)
Urobilinogen, UA: 0.2 mg/dL (ref 0.0–1.0)
pH: 7 (ref 5.0–8.0)

## 2021-02-16 LAB — COMPREHENSIVE METABOLIC PANEL
ALT: 38 U/L (ref 0–44)
AST: 33 U/L (ref 15–41)
Albumin: 4.4 g/dL (ref 3.5–5.0)
Alkaline Phosphatase: 72 U/L (ref 38–126)
Anion gap: 10 (ref 5–15)
BUN: 11 mg/dL (ref 6–20)
CO2: 26 mmol/L (ref 22–32)
Calcium: 9.4 mg/dL (ref 8.9–10.3)
Chloride: 98 mmol/L (ref 98–111)
Creatinine, Ser: 0.74 mg/dL (ref 0.61–1.24)
GFR, Estimated: >60 mL/min (ref >60)
Glucose, Bld: 114 mg/dL — ABNORMAL HIGH (ref 70–99)
Potassium: 3.7 mmol/L (ref 3.5–5.1)
Sodium: 134 mmol/L — ABNORMAL LOW (ref 135–145)
Total Bilirubin: 0.7 mg/dL (ref 0.3–1.2)
Total Protein: 8.4 g/dL — ABNORMAL HIGH (ref 6.5–8.1)

## 2021-02-16 LAB — CBC WITH DIFFERENTIAL/PLATELET
Abs Immature Granulocytes: 0.02 10*3/uL (ref 0.00–0.07)
Basophils Absolute: 0 10*3/uL (ref 0.0–0.1)
Basophils Relative: 1 %
Eosinophils Absolute: 0.1 10*3/uL (ref 0.0–0.5)
Eosinophils Relative: 2 %
HCT: 44.8 % (ref 39.0–52.0)
Hemoglobin: 14.7 g/dL (ref 13.0–17.0)
Immature Granulocytes: 0 %
Lymphocytes Relative: 32 %
Lymphs Abs: 1.8 10*3/uL (ref 0.7–4.0)
MCH: 28.4 pg (ref 26.0–34.0)
MCHC: 32.8 g/dL (ref 30.0–36.0)
MCV: 86.5 fL (ref 80.0–100.0)
Monocytes Absolute: 0.4 10*3/uL (ref 0.1–1.0)
Monocytes Relative: 7 %
Neutro Abs: 3.4 10*3/uL (ref 1.7–7.7)
Neutrophils Relative %: 58 %
Platelets: 284 10*3/uL (ref 150–400)
RBC: 5.18 MIL/uL (ref 4.22–5.81)
RDW: 12.9 % (ref 11.5–15.5)
WBC: 5.8 10*3/uL (ref 4.0–10.5)
nRBC: 0 % (ref 0.0–0.2)

## 2021-02-16 LAB — LIPASE, BLOOD: Lipase: 32 U/L (ref 11–51)

## 2021-02-16 LAB — CBG MONITORING, ED: Glucose-Capillary: 118 mg/dL — ABNORMAL HIGH (ref 70–99)

## 2021-02-16 MED ORDER — HYDROXYZINE HCL 25 MG PO TABS: 25.0000 mg | ORAL_TABLET | Freq: Three times a day (TID) | ORAL | 0 refills | Status: DC | PRN

## 2021-02-16 NOTE — ED Triage Notes (Addendum)
epigastric pain, recurrent pain for 3-4 days.  Took mylanta, minimal relief.  Last night woke with nausea, no vomiting. Patient is anxious, no longer taking anxiety medicine  Patient stopped his anxiety medicine on his own, concerned for side effects.  Patient asking if anxiety can make him dizzy.  Patient is very anxious

## 2021-02-16 NOTE — Discharge Instructions (Signed)
If you have any worsening symptoms you need to go to the emergency room for imaging as we discussed.  Please avoid alcohol and drink plenty of fluid.  Eat a bland diet.  Use hydroxyzine up to 3 times a day as needed for anxiety.  This can make you sleepy so do not drive or drink alcohol with it.  We need to establish you with a primary care provider for ongoing care.  If you are unable to see PCP within 1 week please return to our clinic for blood pressure recheck and reevaluation.  As we discussed, if your pain continues or worsens or if you develop any additional symptoms you need to go to the emergency room.

## 2021-02-16 NOTE — ED Provider Notes (Signed)
[facility]    CSN: [id number] Arrival date & time: 02/16/21  6384      History   Chief Complaint Chief Complaint  Patient presents with  . Abdominal Pain    HPI Wesley [name] is a 55 y.o. male.   Patient presents today with a 3 to 4-day history of intermittent lightheadedness.  He describes a sensation as though he is going to pass out but denies any syncopal episodes.  He also reports a several day history of epigastric abdominal pain.  This is rated 5 on a 0-10 pain scale, described as aching, no aggravating relieving factors identified.  He does have a history of GERD and several other gastrointestinal disorders and is followed by GI but missed appointment for endoscopy.  He does report symptoms are worse with spicy/acidic foods.  He is also concerned that anxiety is contributing to symptoms.  He was previously well managed on Ativan but discontinued this medication due to concern for side effects.  Reports last dose was was approximately 1 year ago and review of [place] controlled substance database shows last refill was 02/24/2020.  He does report increased stressors and has been drinking more alcohol recently.  Reports drinking only on the weekends but is drinking more than normal.  He denies any recent illness or medication changes.  He denies any recent head injury.  He denies associated symptoms including fever, focal weakness, headache, vomiting, melena, hematochezia, unintentional weight loss.  He does not take a GLP-1 agonist.     Past Medical History:  Diagnosis Date  . Alcohol abuse   . Anxiety   . Anxiety   . Arthritis   . Chronic neck pain   . Cocaine abuse (HCC)   . Dizziness   . GERD (gastroesophageal reflux disease)   . Hypercholesteremia   . Hypertension   . Prediabetes     Patient Active Problem List   Diagnosis Date Noted  . Legally blind in right eye, as defined in [place] 05/02/2017  . Prediabetes 04/23/2017  . Muscle spasm 04/23/2017  .  Essential hypertension 04/16/2017  . H/O medication noncompliance 04/02/2017  . Vitamin D deficiency 04/02/2017  . DJD (degenerative joint disease) of cervical spine 03/16/2017  . 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 Dizziness 01/23/2017  . Anxiety 11/21/2016    Past Surgical History:  Procedure Laterality Date  . EYE SURGERY Right ~ 1980   "hit my eye"       Home Medications    Prior to Admission medications   Medication Sig Start Date End Date Taking? Authorizing Provider  hydrOXYzine (ATARAX/VISTARIL) 25 MG tablet Take 1 tablet (25 mg total) by mouth every 8 (eight) hours as needed for anxiety. 02/16/21  Yes Mirage Pfefferkorn K, PA-C  lisinopril (ZESTRIL) 20 MG tablet Take 20 mg by mouth at bedtime. 02/24/20  Yes [provider]  acetaminophen (TYLENOL) 500 MG tablet Take 1 tablet (500 mg total) by mouth every 6 (six) hours as needed. 12/19/18   Wallis Bamberg, PA-C  albuterol (VENTOLIN HFA) 108 (90 Base) MCG/ACT inhaler Inhale 1-2 puffs into the lungs every 6 (six) hours as needed for wheezing or shortness of breath. 10/22/20   Rhys Martini, PA-C  amLODipine (NORVASC) 5 MG tablet Take 1 tablet (5 mg total) by mouth daily. Patient not taking: Reported on 02/16/2021 07/12/19   Elvina Sidle, MD  benzonatate (TESSALON) 200 MG capsule Take 1 capsule (200 mg total) by mouth 3 (three) times daily as needed for cough. Patient  not taking: Reported on 02/16/2021 01/31/21   Belinda FisherYu, Amy V, PA-C  fluocinolone (SYNALAR) 0.025 % ointment Apply topically 2 (two) times daily. Patient not taking: Reported on 02/16/2021 12/27/20   Valinda HoarWhite, Adrienne R, NP  ipratropium (ATROVENT) 0.06 % nasal spray Place 2 sprays into both nostrils 4 (four) times daily. Patient not taking: Reported on 02/16/2021 01/31/21   Belinda FisherYu, Amy V, PA-C  lisinopril (ZESTRIL) 10 MG tablet Take 1 tablet (10 mg total) by mouth daily. Patient taking differently: Take 5 mg by mouth daily.  07/12/19   Elvina SidleLauenstein, Kurt, MD  lovastatin (MEVACOR) 10 MG tablet Take  10 mg by mouth at bedtime. Patient not taking: Reported on 02/16/2021 02/24/20   [provider]  neomycin-polymyxin b-dexamethasone (MAXITROL) 3.5-10000-0.1 OINT APPLY A SMALL AMOUNT IN THE CONJUNCTIVAL SAC IN LEFT EYE TWICE DAILY Patient not taking: Reported on 02/16/2021 12/11/19   [provider]  nystatin (MYCOSTATIN/NYSTOP) powder Apply 1 application topically 2 (two) times daily. Patient not taking: Reported on 02/16/2021 10/22/20   Rhys MartiniGraham, Laura E, PA-C  nystatin cream (MYCOSTATIN) Apply to affected area 2 times daily Patient not taking: Reported on 02/16/2021 10/22/20   Rhys MartiniGraham, Laura E, PA-C  omeprazole (PRILOSEC) 20 MG capsule Take 1 capsule (20 mg total) by mouth daily. Patient not taking: Reported on 02/16/2021 10/22/20 11/21/20  Rhys MartiniGraham, Laura E, PA-C  promethazine (PHENERGAN) 25 MG tablet Take 25 mg by mouth every 8 (eight) hours as needed. Patient not taking: Reported on 02/16/2021 02/24/20   [provider]  promethazine-dextromethorphan (PROMETHAZINE-DM) 6.25-15 MG/5ML syrup Take 5 mLs by mouth 4 (four) times daily as needed for cough. Patient not taking: Reported on 02/16/2021 10/22/20   Rhys MartiniGraham, Laura E, PA-C  Vitamin D, Ergocalciferol, (DRISDOL) 1.25 MG (50000 UNIT) CAPS capsule Take 1 capsule (50,000 Units total) by mouth every 7 (seven) days. Patient not taking: Reported on 02/16/2021 03/31/20   Dahlia ByesBast, Traci A, NP  Zinc Oxide (TRIPLE PASTE) 12.8 % ointment Apply 1 application topically as needed for irritation. Patient not taking: Reported on 02/16/2021 12/27/20   Valinda HoarWhite, Adrienne R, NP  esomeprazole (NEXIUM) 20 MG capsule Take 20 mg by mouth daily. 02/24/20 10/22/20  [provider]  famotidine (PEPCID) 20 MG tablet Take 1 tablet (20 mg total) by mouth 2 (two) times daily. 03/07/20 10/22/20  Wallis BambergMani, Mario, PA-C  sucralfate (CARAFATE) 1 GM/10ML suspension Take 10 mLs (1 g total) by mouth 4 (four) times daily -  with meals and at bedtime. 12/22/18 03/02/19  Emi HolesLaw, Alexandra M, PA-C     Family History Family History  Problem Relation Age of Onset  . Hypertension Mother   . Hypertension Father   . Colon cancer Neg Hx   . Colon polyps Neg Hx   . Esophageal cancer Neg Hx   . Rectal cancer Neg Hx   . Stomach cancer Neg Hx     Social History Social History   Tobacco Use  . Smoking status: Former Games developermoker  . Smokeless tobacco: Never Used  Vaping Use  . Vaping Use: Never used  Substance Use Topics  . Alcohol use: Yes    Comment: socially  . Drug use: Not Currently     Allergies   Ibuprofen, Other, and Zofran [ondansetron hcl]   Review of Systems Review of Systems  Constitutional: Positive for activity change. Negative for appetite change, fatigue and fever.  Respiratory: Negative for cough and shortness of breath.   Cardiovascular: Negative for chest pain.  Gastrointestinal: Positive for abdominal pain and  nausea. Negative for diarrhea and vomiting.  Musculoskeletal: Negative for arthralgias and myalgias.  Neurological: Positive for light-headedness. Negative for dizziness, syncope, weakness and headaches.  Psychiatric/Behavioral: The patient is nervous/anxious.      Physical Exam Triage Vital Signs ED Triage Vitals  Enc Vitals Group     BP 02/16/21 0848 (!) 163/95     Pulse Rate 02/16/21 0848 90     Resp 02/16/21 0848 (!) 22     Temp 02/16/21 0848 97.7 F (36.5 C)     Temp Source 02/16/21 0848 Oral     SpO2 02/16/21 0848 98 %     Weight --      Height --      Head Circumference --      Peak Flow --      Pain Score 02/16/21 0844 6     Pain Loc --      Pain Edu? --      Excl. in GC? --    Orthostatic VS for the past 24 hrs:  BP- Lying Pulse- Lying BP- Sitting Pulse- Sitting BP- Standing at 0 minutes Pulse- Standing at 0 minutes  02/16/21 0949 163/84 87 (!) 174/99 89 (!) 162/100 91    Updated Vital Signs BP (!) 163/95 (BP Location: Left Arm) Comment (BP Location): large cuff  Pulse 90   Temp 97.7 F (36.5 C) (Oral)   Resp (!) 22    SpO2 98%   Visual Acuity Right Eye Distance:   Left Eye Distance:   Bilateral Distance:    Right Eye Near:   Left Eye Near:    Bilateral Near:     Physical Exam Vitals reviewed.  Constitutional:      General: He is awake.     Appearance: Normal appearance. He is normal weight. He is not ill-appearing.     Comments: Very pleasant male appears stated age in no acute distress  HENT:     Head: Normocephalic and atraumatic.     Right Ear: Tympanic membrane, ear canal and external ear normal. Tympanic membrane is not erythematous or bulging.     Left Ear: Tympanic membrane, ear canal and external ear normal. Tympanic membrane is not erythematous or bulging.     Nose: Nose normal.     Mouth/Throat:     Dentition: Abnormal dentition.     Pharynx: Uvula midline. No oropharyngeal exudate or posterior oropharyngeal erythema.  Eyes:     Extraocular Movements: Extraocular movements intact.     Conjunctiva/sclera: Conjunctivae normal.     Pupils: Pupils are equal, round, and reactive to light.  Cardiovascular:     Rate and Rhythm: Normal rate and regular rhythm.     Heart sounds: Normal heart sounds, S1 normal and S2 normal. No murmur heard.   Pulmonary:     Effort: Pulmonary effort is normal. No accessory muscle usage or respiratory distress.     Breath sounds: Normal breath sounds. No stridor. No wheezing, rhonchi or rales.     Comments: Clear to auscultation bilaterally Abdominal:     General: Bowel sounds are normal.     Palpations: Abdomen is soft.     Tenderness: There is abdominal tenderness in the epigastric area. There is no right CVA tenderness, left CVA tenderness, guarding or rebound.     Comments: Mild tenderness palpation in epigastrium.  No evidence of acute abdomen on physical exam.  Musculoskeletal:     Cervical back: Normal range of motion and neck supple.     Comments:  Strength 5/5 bilateral upper and lower extremities  Lymphadenopathy:     Head:     Right side of  head: No submental, submandibular or tonsillar adenopathy.     Left side of head: No submental, submandibular or tonsillar adenopathy.     Cervical: No cervical adenopathy.  Neurological:     General: No focal deficit present.     Mental Status: He is alert and oriented to person, place, and time.     Cranial Nerves: Cranial nerves are intact.     Motor: Motor function is intact.     Coordination: Coordination is intact.     Gait: Gait is intact.     Comments: Cranial nerves II through XII intact.  No focal neurological defect on exam.  Psychiatric:        Behavior: Behavior is cooperative.      UC Treatments / Results  Labs (all labs ordered are listed, but only abnormal results are displayed) Labs Reviewed  POCT URINALYSIS DIPSTICK, ED / UC - Abnormal; Notable for the following components:      Result Value   Hgb urine dipstick TRACE (*)    All other components within normal limits  CBG MONITORING, ED - Abnormal; Notable for the following components:   Glucose-Capillary 118 (*)    All other components within normal limits  CBC WITH DIFFERENTIAL/PLATELET  COMPREHENSIVE METABOLIC PANEL  LIPASE, BLOOD    EKG   Radiology No results found.  Procedures Procedures (including critical care time)  Medications Ordered in UC Medications - No data to display  Initial Impression / Assessment and Plan / UC Course  I have reviewed the triage vital signs and the nursing notes.  Pertinent labs & imaging results that were available during my care of the patient were reviewed by me and considered in my medical decision making (see chart for details).     Vital signs and physical exam reassuring today; no indication for emergent evaluation or imaging.  EKG obtained showed normal sinus rhythm with criteria for LVH and no acute findings and no significant change from 03/26/2020 tracing.  Capillary blood glucose was normal at 118.  Urine was normal with no evidence of significant  dehydration but did show trace hemoglobin we will plan to repeat this at follow-up.  Orthostatic vital signs did not show change of systolic when going from sitting to standing and patient was encouraged to push fluids.  Discussed that we are unable to obtain imaging to investigate epigastric abdominal pain but given minimal tenderness on exam he declined evaluation in ER.  CBC, CMP, lipase were obtained today-results pending.  Discussed that if he has any worsening symptoms he will need to go to the emergency room for imaging to which he expressed understanding.  Recommended he eat a very bland diet and abstain from alcohol.  Patient believes symptoms are at least partially related to anxiety and requested a refill of Ativan.  Discussed that we do not prescribe benzodiazepines but he was given hydroxyzine to help with acute anxiety with instruction not to drive or drink alcohol with this medication as drowsiness is a common side effect.  Recommended deep breathing exercises to help manage symptoms.  Patient does not have a primary care provider so we will try to establish him with one via PCP assistance for ongoing management.  Discussed that he needs to be reevaluated within 1 week and if this cannot be done with a primary care provider he needs to return to clinic.  Discussed alarm symptoms that warrant emergent evaluation.  Strict return precautions given to which patient expressed understanding.  40 minutes spent with patient performing history and physical exam, ordering and interpreting studies, discussing possible treatment options and disposition, answering questions.  Final Clinical Impressions(s) / UC Diagnoses   Final diagnoses:  Episodic lightheadedness  Epigastric abdominal pain  Nausea without vomiting  Anxiety     Discharge Instructions     If you have any worsening symptoms you need to go to the emergency room for imaging as we discussed.  Please avoid alcohol and drink plenty of  fluid.  Eat a bland diet.  Use hydroxyzine up to 3 times a day as needed for anxiety.  This can make you sleepy so do not drive or drink alcohol with it.  We need to establish you with a primary care provider for ongoing care.  If you are unable to see PCP within 1 week please return to our clinic for blood pressure recheck and reevaluation.  As we discussed, if your pain continues or worsens or if you develop any additional symptoms you need to go to the emergency room.    ED Prescriptions    Medication Sig Dispense Auth. Provider   hydrOXYzine (ATARAX/VISTARIL) 25 MG tablet Take 1 tablet (25 mg total) by mouth every 8 (eight) hours as needed for anxiety. 12 tablet Jazon Jipson, Noberto Retort, PA-C     PDMP not reviewed this encounter.   Jeani Hawking, PA-C 02/16/21 1019

## 2021-02-26 ENCOUNTER — Ambulatory Visit (HOSPITAL_COMMUNITY)
Admission: EM | Admit: 2021-02-26 | Discharge: 2021-02-26 | Disposition: A | Discharge status: Home / Self Care | Payer: Self-pay | Attending: Physician Assistant | Admitting: Physician Assistant

## 2021-02-26 ENCOUNTER — Encounter (HOSPITAL_COMMUNITY): Payer: Self-pay

## 2021-02-26 DIAGNOSIS — R1013 Epigastric pain: Secondary | ICD-10-CM

## 2021-02-26 DIAGNOSIS — Z8619 Personal history of other infectious and parasitic diseases: Secondary | ICD-10-CM

## 2021-02-26 MED ORDER — OMEPRAZOLE 20 MG PO CPDR: 20.0000 mg | DELAYED_RELEASE_CAPSULE | Freq: Every day | ORAL | 0 refills | Status: DC

## 2021-02-26 NOTE — Discharge Instructions (Addendum)
Follow-up with gastroenterology for further evaluation management as we discussed.  Restart omeprazole.  That should be taken on empty stomach first thing in the morning 30 minutes before you eat or 2 hours after a meal.  Eat a bland diet and avoid spicy/acidic/fatty foods.  Decrease alcohol consumption and drink more water.  If you have any worsening symptoms as we discussed you would need to go to the emergency room for imaging.

## 2021-02-26 NOTE — ED Provider Notes (Signed)
[facility]    CSN: [id number] Arrival date & time: 02/26/21  1008      History   Chief Complaint Chief Complaint  Patient presents with   Abdominal Pain    HPI Wesley [name] is a 55 y.o. male.   Patient presents today with a 1 several day history of epigastric abdominal pain.  He reports pain is rated 5/6 on a 0-10 pain scale, localized to epigastrium without radiation, described as aching, no aggravating relieving factors identified.  Patient does report drinking alcohol prior to symptom onset but states typical amount and denies any increase in consumption.  He does have a history of H. pylori and was treated earlier this month.  He is interested in repeat H. pylori testing today.  He reports completing course of antibiotics as prescribed without missing doses or noted side effects.  He is not currently taking any medication for GERD and is open to starting a medicine to help manage symptoms.  He denies any melena, hematochezia, nausea, vomiting, fever, hematemesis, dysphagia, unintentional weight loss.  Patient was seen for similar symptoms on 02/16/2021 at which point lipase, CBC, CMP that were stable.   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 Authorizing Provider  acetaminophen (TYLENOL) 500 MG tablet Take 1 tablet (500 mg total) by mouth every 6 (six) hours as needed. 12/19/18   Wallis Bamberg, PA-C  albuterol (VENTOLIN HFA) 108 (90 Base) MCG/ACT inhaler Inhale 1-2 puffs into the lungs every 6 (six) hours as needed for wheezing or shortness of breath. 10/22/20   Rhys Martini, PA-C  amLODipine (NORVASC) 5 MG tablet Take 1 tablet (5 mg total) by mouth daily. Patient not taking: Reported on 02/16/2021 07/12/19   Elvina Sidle, MD  benzonatate (TESSALON) 200 MG capsule Take 1 capsule (200 mg total) by mouth 3 (three) times daily as needed for cough. Patient not taking: Reported on 02/16/2021 01/31/21   Belinda Fisher, PA-C  fluocinolone (SYNALAR) 0.025 % ointment Apply topically 2 (two) times daily. Patient not taking: Reported on 02/16/2021 12/27/20   Valinda Hoar, NP  hydrOXYzine (ATARAX/VISTARIL) 25 MG tablet Take 1 tablet (25 mg total) by mouth every 8 (eight) hours as needed for anxiety. 02/16/21   Aubryana Vittorio K, PA-C  ipratropium (ATROVENT) 0.06 % nasal spray Place 2 sprays into both nostrils 4 (four) times daily. Patient not taking: Reported on 02/16/2021 01/31/21   Belinda Fisher, PA-C  lisinopril (ZESTRIL) 10 MG tablet Take 1 tablet (10 mg total) by  mouth daily. Patient taking differently: Take 5 mg by mouth daily.  07/12/19   Elvina SidleLauenstein, Kurt, MD  lisinopril (ZESTRIL) 20 MG tablet Take 20 mg by mouth at bedtime. 02/24/20   [provider]  lovastatin (MEVACOR) 10 MG tablet Take 10 mg by mouth at bedtime. Patient not taking: Reported on 02/16/2021 02/24/20   [provider]  neomycin-polymyxin b-dexamethasone (MAXITROL) 3.5-10000-0.1 OINT APPLY A SMALL AMOUNT IN THE CONJUNCTIVAL SAC IN LEFT EYE TWICE DAILY Patient not taking: Reported on 02/16/2021 12/11/19   [provider]  nystatin (MYCOSTATIN/NYSTOP) powder Apply 1  application topically 2 (two) times daily. Patient not taking: Reported on 02/16/2021 10/22/20   Rhys MartiniGraham, Laura E, PA-C  nystatin cream (MYCOSTATIN) Apply to affected area 2 times daily Patient not taking: Reported on 02/16/2021 10/22/20   Rhys MartiniGraham, Laura E, PA-C  omeprazole (PRILOSEC) 20 MG capsule Take 1 capsule (20 mg total) by mouth daily. 02/26/21 03/28/21  Colonel Krauser, Noberto RetortErin K, PA-C  promethazine (PHENERGAN) 25 MG tablet Take 25 mg by mouth every 8 (eight) hours as needed. Patient not taking: Reported on 02/16/2021 02/24/20   [provider]  promethazine-dextromethorphan (PROMETHAZINE-DM) 6.25-15 MG/5ML syrup Take 5 mLs by mouth 4 (four) times daily as needed for cough. Patient not taking: Reported on 02/16/2021 10/22/20   Rhys MartiniGraham, Laura E, PA-C  Vitamin D, Ergocalciferol, (DRISDOL) 1.25 MG (50000 UNIT) CAPS capsule Take 1 capsule (50,000 Units total) by mouth every 7 (seven) days. Patient not taking: Reported on 02/16/2021 03/31/20   Dahlia ByesBast, Traci A, NP  Zinc Oxide (TRIPLE PASTE) 12.8 % ointment Apply 1 application topically as needed for irritation. Patient not taking: Reported on 02/16/2021 12/27/20   Valinda HoarWhite, Adrienne R, NP  esomeprazole (NEXIUM) 20 MG capsule Take 20 mg by mouth daily. 02/24/20 10/22/20  [provider]  famotidine (PEPCID) 20 MG tablet Take 1 tablet (20 mg total) by mouth 2 (two) times daily. 03/07/20 10/22/20  Wallis BambergMani, Mario, PA-C  sucralfate (CARAFATE) 1 GM/10ML suspension Take 10 mLs (1 g total) by mouth 4 (four) times daily -  with meals and at bedtime. 12/22/18 03/02/19  Emi HolesLaw, Alexandra M, PA-C    Family History Family History  Problem Relation Age of Onset   Hypertension Mother    Hypertension Father    Colon cancer Neg Hx    Colon polyps Neg Hx    Esophageal cancer Neg Hx    Rectal cancer Neg Hx    Stomach cancer Neg Hx     Social History Social History   Tobacco Use   Smoking status: Former    Pack years: 0.00   Smokeless tobacco: Never  Vaping Use   Vaping Use:  Never used  Substance Use Topics   Alcohol use: Yes    Comment: socially   Drug use: Not Currently     Allergies   Ibuprofen, Other, and Zofran [ondansetron hcl]   Review of Systems Review of Systems  Constitutional:  Negative for activity change, appetite change, fatigue and fever.  Respiratory:  Negative for cough and shortness of breath.   Cardiovascular:  Negative for chest pain.  Gastrointestinal:  Positive for abdominal pain. Negative for diarrhea, nausea and vomiting.  Neurological:  Negative for dizziness, light-headedness and headaches.    Physical Exam Triage Vital Signs ED Triage Vitals  Enc Vitals Group     BP 02/26/21 1044 128/88     Pulse Rate 02/26/21 1044 92     Resp 02/26/21 1044 19     Temp 02/26/21 1044  98.9 F (37.2 C)     Temp Source 02/26/21 1044 Oral     SpO2 02/26/21 1044 97 %     Weight --      Height --      Head Circumference --      Peak Flow --      Pain Score 02/26/21 1043 6     Pain Loc --      Pain Edu? --      Excl. in GC? --    No data found.  Updated Vital Signs BP 128/88 (BP Location: Right Arm)   Pulse 92   Temp 98.9 F (37.2 C) (Oral)   Resp 19   SpO2 97%   Visual Acuity Right Eye Distance:   Left Eye Distance:   Bilateral Distance:    Right Eye Near:   Left Eye Near:    Bilateral Near:     Physical Exam Vitals reviewed.  Constitutional:      General: He is awake.     Appearance: Normal appearance. He is normal weight. He is not ill-appearing.     Comments: Very pleasant male appears stated age in no acute distress  HENT:     Head: Normocephalic and atraumatic.     Mouth/Throat:     Mouth: Mucous membranes are moist.     Pharynx: Uvula midline. No oropharyngeal exudate or posterior oropharyngeal erythema.  Cardiovascular:     Rate and Rhythm: Normal rate and regular rhythm.     Heart sounds: Normal heart sounds, S1 normal and S2 normal. No murmur heard. Pulmonary:     Effort: Pulmonary effort is normal.      Breath sounds: Normal breath sounds. No stridor. No wheezing, rhonchi or rales.     Comments: Clear to auscultation bilaterally Abdominal:     General: Bowel sounds are normal.     Palpations: Abdomen is soft.     Tenderness: There is abdominal tenderness in the epigastric area. There is no right CVA tenderness, left CVA tenderness, guarding or rebound. Negative signs include Murphy's sign.     Comments: Mild tenderness palpation in epigastrium.  No evidence of acute abdomen on physical exam.  Musculoskeletal:     Cervical back: Normal range of motion and neck supple.  Neurological:     Mental Status: He is alert.  Psychiatric:        Behavior: Behavior is cooperative.     UC Treatments / Results  Labs (all labs ordered are listed, but only abnormal results are displayed) Labs Reviewed - No data to display  EKG   Radiology No results found.  Procedures Procedures (including critical care time)  Medications Ordered in UC Medications - No data to display  Initial Impression / Assessment and Plan / UC Course  I have reviewed the triage vital signs and the nursing notes.  Pertinent labs & imaging results that were available during my care of the patient were reviewed by me and considered in my medical decision making (see chart for details).     Physical exam and vital signs reassuring today; no indication for emergent evaluation or imaging.  Discussed potential utility of repeating labs including lipase but patient declined this today.  Discussed that we are unable to obtain imaging in urgent care and if he has any persistent or worsening symptoms he needs to go to the emergency room.  Also discussed that we are unable to test for H. pylori in urgent care setting.  He was given  contact information for GI specialist and instructed to follow-up with them for further evaluation.  Patient was restarted on omeprazole to help manage symptoms and encouraged to limit alcohol  consumption and avoid spicy/acidic foods.  Discussed alarm symptoms that warrant emergent evaluation.  Strict return precautions given to which patient expressed understanding.   Final Clinical Impressions(s) / UC Diagnoses   Final diagnoses:  Epigastric abdominal pain  History of Helicobacter pylori infection     Discharge Instructions      Follow-up with gastroenterology for further evaluation management as we discussed.  Restart omeprazole.  That should be taken on empty stomach first thing in the morning 30 minutes before you eat or 2 hours after a meal.  Eat a bland diet and avoid spicy/acidic/fatty foods.  Decrease alcohol consumption and drink more water.  If you have any worsening symptoms as we discussed you would need to go to the emergency room for imaging.     ED Prescriptions     Medication Sig Dispense Auth. Provider   omeprazole (PRILOSEC) 20 MG capsule Take 1 capsule (20 mg total) by mouth daily. 30 capsule Dawud Mays K, PA-C      PDMP not reviewed this encounter.   Jeani Hawking, PA-C 02/26/21 1059

## 2021-02-26 NOTE — ED Triage Notes (Signed)
Pt in with c/o epigastric pain and nausea x 1 week   Pt has taken reflux with no relief

## 2021-05-01 ENCOUNTER — Encounter (HOSPITAL_COMMUNITY): Payer: Self-pay

## 2021-05-01 ENCOUNTER — Ambulatory Visit (HOSPITAL_COMMUNITY)
Admission: EM | Admit: 2021-05-01 | Discharge: 2021-05-01 | Disposition: A | Discharge status: Home / Self Care | Payer: Self-pay

## 2021-05-01 DIAGNOSIS — R1011 Right upper quadrant pain: Secondary | ICD-10-CM

## 2021-05-01 NOTE — ED Provider Notes (Addendum)
[facility]    CSN: [id number] Arrival date & time: 05/01/21  1059      History   Chief Complaint Chief Complaint  Patient presents with   Abdominal Pain    HPI Wesley [name] is a 55 y.o. male.   Patient here for evaluation of right upper/epigastric abdominal pain, nausea, and bloating that has been worsening over the last 3 days.  Reports symptoms have been ongoing for the past several months.  Patient has been seen and evaluated several times for same and given medications for GERD and nausea.  Reports medications are not helping.  Reports symptoms are worse after eating.  Denies any trauma, injury, or other precipitating event.  Denies any fevers, chest pain, shortness of breath, N/V/D, numbness, tingling, weakness, abdominal pain, or headaches.    The history is provided by the patient.  Abdominal Pain Associated symptoms: nausea   Associated symptoms: no constipation, no diarrhea and no vomiting    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 Authorizing Provider  acetaminophen (TYLENOL) 500 MG tablet Take 1 tablet (500 mg total) by mouth every 6  (six) hours as needed. 12/19/18   Wallis Bamberg, PA-C  albuterol (VENTOLIN HFA) 108 (90 Base) MCG/ACT inhaler Inhale 1-2 puffs into the lungs every 6 (six) hours as needed for wheezing or shortness of breath. 10/22/20   Rhys Martini, PA-C  amLODipine (NORVASC) 5 MG tablet Take 1 tablet (5 mg total) by mouth daily. Patient not taking: Reported on 02/16/2021 07/12/19   Elvina Sidle, MD  benzonatate (TESSALON) 200 MG capsule Take 1 capsule (200 mg total) by mouth 3 (three) times daily as needed for cough. Patient not taking: Reported on 02/16/2021 01/31/21   Belinda Fisher, PA-C  fluocinolone (SYNALAR) 0.025 % ointment Apply topically 2 (two) times daily. Patient not taking: Reported on 02/16/2021 12/27/20   Valinda Hoar, NP  hydrOXYzine (ATARAX/VISTARIL) 25 MG tablet Take 1 tablet (25 mg total) by mouth every 8 (eight) hours as needed for anxiety. 02/16/21   Raspet, Erin K, PA-C  ipratropium (ATROVENT) 0.06 % nasal spray Place 2 sprays into both nostrils 4 (four) times daily. Patient not taking: Reported on 02/16/2021 01/31/21   Belinda Fisher, PA-C  lisinopril (ZESTRIL) 10 MG tablet Take 1 tablet (10 mg total) by mouth daily. Patient taking differently: Take 5 mg by mouth daily.  07/12/19   Elvina Sidle, MD  lisinopril (ZESTRIL) 20 MG tablet Take 20 mg by mouth at bedtime. 02/24/20   [provider]  lovastatin (MEVACOR) 10 MG tablet Take 10 mg by mouth at bedtime. Patient not taking: Reported on 02/16/2021 02/24/20   [provider]  neomycin-polymyxin b-dexamethasone (MAXITROL) 3.5-10000-0.1 OINT APPLY A SMALL AMOUNT IN THE CONJUNCTIVAL SAC IN LEFT EYE TWICE DAILY Patient not taking: Reported on 02/16/2021 12/11/19   [provider]  nystatin (MYCOSTATIN/NYSTOP) powder Apply 1 application topically 2 (two) times daily. Patient not taking: Reported on 02/16/2021 10/22/20   Rhys Martini, PA-C  nystatin cream (MYCOSTATIN) Apply to affected area 2 times daily Patient not taking: Reported on  02/16/2021 10/22/20   Rhys Martini, PA-C  omeprazole (PRILOSEC) 20 MG capsule Take 1 capsule (20 mg total) by mouth daily. 02/26/21 03/28/21  Raspet, Noberto Retort, PA-C  promethazine (PHENERGAN) 25 MG tablet Take 25 mg by mouth every 8 (eight) hours as needed. Patient not taking: Reported on 02/16/2021 02/24/20   [provider]  promethazine-dextromethorphan (PROMETHAZINE-DM) 6.25-15 MG/5ML syrup Take 5 mLs by mouth 4 (four) times daily as needed for cough. Patient not taking: Reported on 02/16/2021 10/22/20   Rhys Martini, PA-C  Vitamin D, Ergocalciferol, (DRISDOL) 1.25 MG (50000 UNIT) CAPS capsule Take 1 capsule (50,000 Units total) by mouth every 7 (seven) days. Patient not taking: Reported on 02/16/2021 03/31/20   Dahlia Byes A, NP  Zinc Oxide (TRIPLE PASTE) 12.8 % ointment Apply 1 application topically as needed for irritation. Patient not taking: Reported on 02/16/2021 12/27/20   Valinda Hoar, NP  esomeprazole (NEXIUM) 20 MG capsule Take 20 mg by mouth daily. 02/24/20 10/22/20  [provider]  famotidine (PEPCID) 20 MG tablet Take 1 tablet (20 mg total) by mouth 2 (two) times daily. 03/07/20 10/22/20  Wallis Bamberg, PA-C  sucralfate (CARAFATE) 1 GM/10ML suspension Take 10 mLs (1 g total) by mouth 4 (four) times daily -  with meals and at bedtime. 12/22/18 03/02/19  Emi Holes, PA-C    Family History Family History  Problem Relation Age of Onset   Hypertension Mother    Hypertension Father    Colon cancer Neg Hx    Colon polyps Neg Hx    Esophageal cancer Neg Hx    Rectal cancer Neg Hx    Stomach cancer Neg Hx     Social History Social History   Tobacco Use   Smoking status: Former   Smokeless tobacco: Never  Building services engineer Use: Never used  Substance Use Topics   Alcohol use: Yes    Comment: socially   Drug use: Not Currently     Allergies   Ibuprofen, Other, and Zofran [ondansetron hcl]   Review of Systems Review of Systems  Gastrointestinal:  Positive  for abdominal pain and nausea. Negative for constipation, diarrhea and vomiting.  All other systems reviewed and are negative.   Physical Exam Triage Vital Signs ED Triage Vitals  Enc Vitals Group     BP 05/01/21 1306 (!) 153/100     Pulse Rate 05/01/21 1306 87     Resp 05/01/21 1306 18     Temp 05/01/21 1306 98.2 F (36.8 C)     Temp Source 05/01/21 1306 Oral     SpO2 05/01/21 1306 98 %     Weight --      Height --      Head Circumference --      Peak Flow --      Pain Score 05/01/21 1308 6     Pain Loc --  Pain Edu? --      Excl. in GC? --    No data found.  Updated Vital Signs BP (!) 153/100 (BP Location: Left Arm)   Pulse 87   Temp 98.2 F (36.8 C) (Oral)   Resp 18   SpO2 98%   Visual Acuity Right Eye Distance:   Left Eye Distance:   Bilateral Distance:    Right Eye Near:   Left Eye Near:    Bilateral Near:     Physical Exam Vitals and nursing note reviewed.  Constitutional:      General: He is not in acute distress.    Appearance: Normal appearance. He is not ill-appearing, toxic-appearing or diaphoretic.  HENT:     Head: Normocephalic and atraumatic.  Eyes:     Conjunctiva/sclera: Conjunctivae normal.  Cardiovascular:     Rate and Rhythm: Normal rate.     Pulses: Normal pulses.  Pulmonary:     Effort: Pulmonary effort is normal.  Abdominal:     General: Abdomen is protuberant. Bowel sounds are normal. There is distension.     Tenderness: There is abdominal tenderness in the right upper quadrant and epigastric area.  Musculoskeletal:        General: Normal range of motion.     Cervical back: Normal range of motion.  Skin:    General: Skin is warm and dry.  Neurological:     General: No focal deficit present.     Mental Status: He is alert and oriented to person, place, and time.  Psychiatric:        Mood and Affect: Mood normal.     UC Treatments / Results  Labs (all labs ordered are listed, but only abnormal results are  displayed) Labs Reviewed - No data to display  EKG   Radiology No results found.  Procedures Procedures (including critical care time)  Medications Ordered in UC Medications - No data to display  Initial Impression / Assessment and Plan / UC Course  I have reviewed the triage vital signs and the nursing notes.  Pertinent labs & imaging results that were available during my care of the patient were reviewed by me and considered in my medical decision making (see chart for details).    Right upper quadrant and epigastric abdominal pain.  As patient does have some right upper quadrant tenderness and distention, and as been evaluated for same in the past, recommend going to the emergency room for further evaluation at a higher level of care.  Patient is stable for transport via private vehicle. Final Clinical Impressions(s) / UC Diagnoses   Final diagnoses:  Abdominal pain, right upper quadrant     Discharge Instructions      I would recommend going to the Emergency Department for further evaluation of abdominal pain.      ED Prescriptions   None    PDMP not reviewed this encounter.   Ivette Loyal, NP 05/01/21 1326    Ivette Loyal, NP 05/01/21 1327

## 2021-05-01 NOTE — ED Triage Notes (Signed)
Pt presents with generalized abdominal pain, bloating, and nausea X 3 days.

## 2021-05-01 NOTE — Discharge Instructions (Addendum)
I would recommend going to the Emergency Department for further evaluation of abdominal pain.     

## 2021-05-16 ENCOUNTER — Other Ambulatory Visit: Payer: Self-pay

## 2021-05-16 ENCOUNTER — Ambulatory Visit (HOSPITAL_COMMUNITY)
Admission: EM | Admit: 2021-05-16 | Discharge: 2021-05-16 | Disposition: A | Discharge status: Home / Self Care | Payer: Self-pay

## 2021-05-16 NOTE — ED Notes (Signed)
Called listed phone number, no answer

## 2021-05-16 NOTE — ED Triage Notes (Signed)
TC to # listed by Pt no answer. Pt called by Selena Batten RN earlier with no answer.

## 2021-05-19 ENCOUNTER — Emergency Department (HOSPITAL_COMMUNITY): Admission: EM | Admit: 2021-05-19 | Discharge: 2021-05-19 | Discharge status: Against Medical Advice | Payer: Self-pay

## 2021-05-31 ENCOUNTER — Encounter (HOSPITAL_COMMUNITY): Payer: Self-pay

## 2021-05-31 ENCOUNTER — Emergency Department (HOSPITAL_COMMUNITY)
Admission: EM | Admit: 2021-05-31 | Discharge: 2021-05-31 | Disposition: A | Discharge status: Home / Self Care | Payer: Self-pay | Attending: Emergency Medicine | Admitting: Emergency Medicine

## 2021-05-31 ENCOUNTER — Other Ambulatory Visit: Payer: Self-pay

## 2021-05-31 DIAGNOSIS — I10 Essential (primary) hypertension: Secondary | ICD-10-CM | POA: Insufficient documentation

## 2021-05-31 DIAGNOSIS — R1013 Epigastric pain: Secondary | ICD-10-CM | POA: Insufficient documentation

## 2021-05-31 DIAGNOSIS — R059 Cough, unspecified: Secondary | ICD-10-CM | POA: Insufficient documentation

## 2021-05-31 DIAGNOSIS — Z5321 Procedure and treatment not carried out due to patient leaving prior to being seen by health care provider: Secondary | ICD-10-CM | POA: Insufficient documentation

## 2021-05-31 LAB — BASIC METABOLIC PANEL
Anion gap: 10 (ref 5–15)
BUN: 10 mg/dL (ref 6–20)
CO2: 25 mmol/L (ref 22–32)
Calcium: 9.2 mg/dL (ref 8.9–10.3)
Chloride: 100 mmol/L (ref 98–111)
Creatinine, Ser: 0.77 mg/dL (ref 0.61–1.24)
GFR, Estimated: >60 mL/min (ref >60)
Glucose, Bld: 126 mg/dL — ABNORMAL HIGH (ref 70–99)
Potassium: 4 mmol/L (ref 3.5–5.1)
Sodium: 135 mmol/L (ref 135–145)

## 2021-05-31 LAB — CBC
HCT: 42.7 % (ref 39.0–52.0)
Hemoglobin: 13.7 g/dL (ref 13.0–17.0)
MCH: 28 pg (ref 26.0–34.0)
MCHC: 32.1 g/dL (ref 30.0–36.0)
MCV: 87.1 fL (ref 80.0–100.0)
Platelets: 271 10*3/uL (ref 150–400)
RBC: 4.9 MIL/uL (ref 4.22–5.81)
RDW: 13 % (ref 11.5–15.5)
WBC: 6.4 10*3/uL (ref 4.0–10.5)
nRBC: 0 % (ref 0.0–0.2)

## 2021-05-31 LAB — TROPONIN I (HIGH SENSITIVITY): Troponin I (High Sensitivity): 3 ng/L (ref <18)

## 2021-05-31 NOTE — ED Notes (Signed)
Pt called X3. Pt could not be located.

## 2021-05-31 NOTE — ED Triage Notes (Signed)
Pt states he woke up at 0200 and felt jittery. Pt states his systolic blood pressure was 170s. Pt states he missed his amlodipine and lisinopril yesterday, has taken these this am.

## 2021-05-31 NOTE — ED Provider Notes (Signed)
Emergency Medicine Provider Triage Evaluation Note  Wesley [name] , a 55 y.o. male  was evaluated in triage.  Pt complains of elevated BP.  Reports associated epigastric pain.  States that he missed his amlodipine and lisinopril yesterday.  Denies any other symptoms.  Review of Systems  Positive: Epigastric pain, cough Negative: Fever, chills  Physical Exam  BP (!) 137/105 (BP Location: Right Arm)   Pulse 92   Temp 98.3 F (36.8 C) (Oral)   Resp 20   SpO2 98%  Gen:   Awake, no distress   Resp:  Normal effort  MSK:   Moves extremities without difficulty  Other:    Medical Decision Making  Medically screening exam initiated at 4:49 AM.  Appropriate orders placed.  Ashby Dejoy was informed that the remainder of the evaluation will be completed by another provider, this initial triage assessment does not replace that evaluation, and the importance of remaining in the ED until their evaluation is complete.  HTN   Roxy Horseman, PA-C 05/31/21 0451    Gilda Crease, MD 05/31/21 [phone]

## 2021-06-03 ENCOUNTER — Ambulatory Visit (HOSPITAL_COMMUNITY)
Admission: EM | Admit: 2021-06-03 | Discharge: 2021-06-03 | Disposition: A | Discharge status: Home / Self Care | Payer: Self-pay | Attending: Physician Assistant | Admitting: Physician Assistant

## 2021-06-03 ENCOUNTER — Other Ambulatory Visit: Payer: Self-pay

## 2021-06-03 ENCOUNTER — Encounter (HOSPITAL_COMMUNITY): Payer: Self-pay

## 2021-06-03 ENCOUNTER — Ambulatory Visit (INDEPENDENT_AMBULATORY_CARE_PROVIDER_SITE_OTHER): Payer: Self-pay

## 2021-06-03 DIAGNOSIS — R7303 Prediabetes: Secondary | ICD-10-CM

## 2021-06-03 DIAGNOSIS — M79644 Pain in right finger(s): Secondary | ICD-10-CM

## 2021-06-03 DIAGNOSIS — H538 Other visual disturbances: Secondary | ICD-10-CM

## 2021-06-03 DIAGNOSIS — M1811 Unilateral primary osteoarthritis of first carpometacarpal joint, right hand: Secondary | ICD-10-CM

## 2021-06-03 DIAGNOSIS — M542 Cervicalgia: Secondary | ICD-10-CM

## 2021-06-03 LAB — POCT URINALYSIS DIPSTICK, ED / UC
Bilirubin Urine: NEGATIVE
Glucose, UA: NEGATIVE mg/dL
Hgb urine dipstick: NEGATIVE
Ketones, ur: NEGATIVE mg/dL
Leukocytes,Ua: NEGATIVE
Nitrite: NEGATIVE
Protein, ur: NEGATIVE mg/dL
Specific Gravity, Urine: 1.02 (ref 1.005–1.030)
Urobilinogen, UA: 0.2 mg/dL (ref 0.0–1.0)
pH: 7 (ref 5.0–8.0)

## 2021-06-03 LAB — CBG MONITORING, ED: Glucose-Capillary: 100 mg/dL — ABNORMAL HIGH (ref 70–99)

## 2021-06-03 MED ORDER — TIZANIDINE HCL 4 MG PO CAPS: 4.0000 mg | ORAL_CAPSULE | Freq: Three times a day (TID) | ORAL | 0 refills | Status: DC | PRN
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
## 2021-06-03 NOTE — ED Triage Notes (Signed)
Pt states he thinks is blood sugar is high. Pt c/o polyuria, tingling in body, and blurred vision x3 days  States was told he was pre diabetic.
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
## 2021-06-03 NOTE — ED Notes (Signed)
Pt left prior to receiving d/c papers or visual acuity.

## 2021-07-12 ENCOUNTER — Other Ambulatory Visit: Payer: Self-pay

## 2021-07-12 DIAGNOSIS — Z7951 Long term (current) use of inhaled steroids: Secondary | ICD-10-CM | POA: Insufficient documentation

## 2021-07-12 DIAGNOSIS — Z79899 Other long term (current) drug therapy: Secondary | ICD-10-CM | POA: Insufficient documentation

## 2021-07-12 DIAGNOSIS — H6121 Impacted cerumen, right ear: Secondary | ICD-10-CM | POA: Insufficient documentation

## 2021-07-12 DIAGNOSIS — Z87891 Personal history of nicotine dependence: Secondary | ICD-10-CM | POA: Insufficient documentation

## 2021-07-12 DIAGNOSIS — I10 Essential (primary) hypertension: Secondary | ICD-10-CM | POA: Insufficient documentation

## 2021-07-13 ENCOUNTER — Encounter (HOSPITAL_COMMUNITY): Payer: Self-pay | Admitting: Emergency Medicine

## 2021-07-13 ENCOUNTER — Emergency Department (HOSPITAL_COMMUNITY)
Admission: EM | Admit: 2021-07-13 | Discharge: 2021-07-13 | Disposition: A | Discharge status: Home / Self Care | Payer: Self-pay | Attending: Emergency Medicine | Admitting: Emergency Medicine

## 2021-07-13 ENCOUNTER — Ambulatory Visit (HOSPITAL_COMMUNITY)
Admission: EM | Admit: 2021-07-13 | Discharge: 2021-07-13 | Disposition: A | Discharge status: Home / Self Care | Payer: Self-pay | Attending: Medical Oncology | Admitting: Medical Oncology

## 2021-07-13 ENCOUNTER — Other Ambulatory Visit: Payer: Self-pay

## 2021-07-13 DIAGNOSIS — H6121 Impacted cerumen, right ear: Secondary | ICD-10-CM

## 2021-07-13 NOTE — Discharge Instructions (Signed)
Do not use Q-tips to clean your ears.  You can gently irrigate the earwax from your ears with warm water.

## 2021-07-13 NOTE — ED Triage Notes (Signed)
Reports cleaned right ear yesterday and today felt like something was in it and hurting so was seen at [facility] ED they got something out of right tear but pt reports still feels like something in there, itching and cant hear as good out of it.

## 2021-07-13 NOTE — ED Provider Notes (Signed)
[facility]    CSN: [id number] Arrival date & time: 07/13/21  0950      History   Chief Complaint Chief Complaint  Patient presents with   Ear Fullness    HPI Wesley [name] is a 55 y.o. male. 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   HPI  Ear Fullness: Pt reports ear fullness sensation. This occurs in his right ear for the past few days. He has tried using a Qtip to remove the wax without success. No bleeding, ear pain, ear pain.   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 Authorizing Provider  acetaminophen (TYLENOL) 500 MG tablet Take 1 tablet (500 mg total) by mouth every 6 (six) hours as needed. 12/19/18   Wallis Bamberg, PA-C  albuterol (VENTOLIN HFA) 108 (90 Base) MCG/ACT inhaler Inhale 1-2 puffs into the lungs every 6 (six) hours as needed for wheezing or shortness of breath. 10/22/20   Rhys Martini, PA-C  amLODipine (NORVASC) 5 MG tablet Take 1 tablet (5 mg total) by mouth daily. Patient not taking: Reported on 02/16/2021 07/12/19   Elvina Sidle, MD  benzonatate (TESSALON) 200 MG capsule Take 1 capsule (200 mg total) by mouth 3 (three) times daily as  needed for cough. Patient not taking: Reported on 02/16/2021 01/31/21   Belinda Fisher, PA-C  fluocinolone (SYNALAR) 0.025 % ointment Apply topically 2 (two) times daily. Patient not taking: Reported on 02/16/2021 12/27/20   Valinda Hoar, NP  hydrOXYzine (ATARAX/VISTARIL) 25 MG tablet Take 1 tablet (25 mg total) by mouth every 8 (eight) hours as needed for anxiety. 02/16/21   Raspet, Erin K, PA-C  ipratropium (ATROVENT) 0.06 % nasal spray Place 2 sprays into both nostrils 4 (four) times daily. Patient not taking: Reported on 02/16/2021 01/31/21   Belinda Fisher, PA-C  lisinopril (ZESTRIL) 10 MG tablet Take 1 tablet (10 mg total) by mouth daily. Patient taking differently: Take 5 mg by mouth daily.  07/12/19   Elvina Sidle, MD  lisinopril (ZESTRIL) 20 MG tablet Take 20 mg by mouth at bedtime. 02/24/20   [provider]  lovastatin (MEVACOR) 10 MG tablet Take 10 mg by mouth at bedtime. Patient not taking: Reported on 02/16/2021 02/24/20   [provider]  neomycin-polymyxin b-dexamethasone (MAXITROL) 3.5-10000-0.1 OINT APPLY A SMALL AMOUNT IN THE CONJUNCTIVAL SAC IN LEFT EYE TWICE DAILY Patient not taking: Reported on 02/16/2021 12/11/19   [provider]  nystatin (MYCOSTATIN/NYSTOP) powder Apply 1 application topically 2 (two) times daily. Patient not taking: Reported on 02/16/2021  10/22/20   Rhys Martini, PA-C  nystatin cream (MYCOSTATIN) Apply to affected area 2 times daily Patient not taking: Reported on 02/16/2021 10/22/20   Rhys Martini, PA-C  omeprazole (PRILOSEC) 20 MG capsule Take 1 capsule (20 mg total) by mouth daily. 02/26/21 03/28/21  Raspet, Noberto Retort, PA-C  promethazine (PHENERGAN) 25 MG tablet Take 25 mg by mouth every 8 (eight) hours as needed. Patient not taking: Reported on 02/16/2021 02/24/20   [provider]  promethazine-dextromethorphan (PROMETHAZINE-DM) 6.25-15 MG/5ML syrup Take 5 mLs by mouth 4 (four) times daily as needed for cough. Patient not taking: Reported  on 02/16/2021 10/22/20   Rhys Martini, PA-C  tiZANidine (ZANAFLEX) 4 MG capsule Take 1 capsule (4 mg total) by mouth 3 (three) times daily as needed for muscle spasms. 06/03/21   Raspet, Noberto Retort, PA-C  Vitamin D, Ergocalciferol, (DRISDOL) 1.25 MG (50000 UNIT) CAPS capsule Take 1 capsule (50,000 Units total) by mouth every 7 (seven) days. Patient not taking: Reported on 02/16/2021 03/31/20   Dahlia Byes A, NP  Zinc Oxide (TRIPLE PASTE) 12.8 % ointment Apply 1 application topically as needed for irritation. Patient not taking: Reported on 02/16/2021 12/27/20   Valinda Hoar, NP  esomeprazole (NEXIUM) 20 MG capsule Take 20 mg by mouth daily. 02/24/20 10/22/20  [provider]  famotidine (PEPCID) 20 MG tablet Take 1 tablet (20 mg total) by mouth 2 (two) times daily. 03/07/20 10/22/20  Wallis Bamberg, PA-C  sucralfate (CARAFATE) 1 GM/10ML suspension Take 10 mLs (1 g total) by mouth 4 (four) times daily -  with meals and at bedtime. 12/22/18 03/02/19  Emi Holes, PA-C    Family History Family History  Problem Relation Age of Onset   Hypertension Mother    Hypertension Father    Colon cancer Neg Hx    Colon polyps Neg Hx    Esophageal cancer Neg Hx    Rectal cancer Neg Hx    Stomach cancer Neg Hx     Social History Social History   Tobacco Use   Smoking status: Former   Smokeless tobacco: Never  Building services engineer Use: Never used  Substance Use Topics   Alcohol use: Yes    Comment: socially   Drug use: Not Currently     Allergies   Ibuprofen, Other, and Zofran [ondansetron hcl]   Review of Systems Review of Systems  As stated above in HPI Physical Exam Triage Vital Signs ED Triage Vitals [07/13/21 1135]  Enc Vitals Group     BP 124/75     Pulse Rate 82     Resp 16     Temp 98.7 F (37.1 C)     Temp src      SpO2 98 %     Weight      Height      Head Circumference      Peak Flow      Pain Score 0     Pain Loc      Pain Edu?      Excl. in GC?    No data  found.  Updated Vital Signs BP 124/75 (BP Location: Right Arm)   Pulse 82   Temp 98.7 F (37.1 C)   Resp 16   SpO2 98%   Physical Exam Vitals and nursing note reviewed.  Constitutional:      General: He is not in acute distress.    Appearance: Normal appearance. He is not ill-appearing, toxic-appearing or  diaphoretic.  HENT:     Head: Normocephalic and atraumatic.     Right Ear: There is impacted cerumen.     Left Ear: Tympanic membrane normal. There is no impacted cerumen.     Nose: Nose normal. No congestion or rhinorrhea.     Mouth/Throat:     Mouth: Mucous membranes are moist.  Eyes:     Extraocular Movements: Extraocular movements intact.     Pupils: Pupils are equal, round, and reactive to light.  Musculoskeletal:     Cervical back: Neck supple.  Skin:    General: Skin is warm.  Neurological:     Mental Status: He is alert and oriented to person, place, and time.     UC Treatments / Results  Labs (all labs ordered are listed, but only abnormal results are displayed) Labs Reviewed - No data to display  EKG   Radiology No results found.  Procedures Procedures (including critical care time)  Medications Ordered in UC Medications - No data to display  Initial Impression / Assessment and Plan / UC Course  I have reviewed the triage vital signs and the nursing notes.  Pertinent labs & imaging results that were available during my care of the patient were reviewed by me and considered in my medical decision making (see chart for details).     New. Cerumen removal in office helped alleviate his symptoms. Care instructions given. Follow up PRN.  Final Clinical Impressions(s) / UC Diagnoses   Final diagnoses:  None   Discharge Instructions   None    ED Prescriptions   None    PDMP not reviewed this encounter.   Rushie Chestnut, [place] 07/13/21 1251

## 2021-07-13 NOTE — ED Triage Notes (Signed)
Pt was here earlier but left to "get some rest."  Pt states he was cleaning his ears w/ qtips and began to experience pain and a loss of hearing.

## 2021-07-13 NOTE — ED Provider Notes (Addendum)
[facility] EMERGENCY DEPARTMENT Provider Note   CSN: [id number] Arrival date & time: 07/12/21  2308     History Chief Complaint  Patient presents with   Otalgia    Wesley [name] is a 55 y.o. male.  Patient here with right-sided ear fullness sensation.  He thinks that he has something stuck in his ear.  States he was trying to clean his ear with a Q-tip earlier.  Reports decreased hearing.  Denies any bleeding or discharge.  The history is provided by the patient. No language interpreter was used.      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   Dizziness 01/23/2017   Anxiety 11/21/2016    Past Surgical History:  Procedure Laterality Date   EYE SURGERY Right ~ 1980   "hit my eye"       Family History  Problem Relation Age of Onset   Hypertension Mother    Hypertension Father    Colon cancer Neg Hx    Colon polyps Neg Hx    Esophageal cancer Neg Hx    Rectal cancer Neg Hx    Stomach cancer Neg Hx     Social History   Tobacco Use   Smoking status: Former   Smokeless tobacco: Never  Building services engineer Use: Never used  Substance Use Topics   Alcohol use: Yes    Comment: socially   Drug use: Not Currently    Home Medications Prior to Admission medications   Medication Sig Start Date End Date Taking? Authorizing Provider  acetaminophen (TYLENOL) 500 MG tablet Take 1  tablet (500 mg total) by mouth every 6 (six) hours as needed. 12/19/18   Wallis Bamberg, PA-C  albuterol (VENTOLIN HFA) 108 (90 Base) MCG/ACT inhaler Inhale 1-2 puffs into the lungs every 6 (six) hours as needed for wheezing or shortness of breath. 10/22/20   Rhys Martini, PA-C  amLODipine (NORVASC) 5 MG tablet Take 1 tablet (5 mg total) by mouth daily. Patient not taking: Reported on 02/16/2021 07/12/19   Elvina Sidle, MD  benzonatate (TESSALON) 200 MG capsule Take 1 capsule (200 mg total) by mouth 3 (three) times daily as needed for cough. Patient not taking: Reported on 02/16/2021 01/31/21   Belinda Fisher, PA-C  fluocinolone (SYNALAR) 0.025 % ointment Apply topically 2 (two) times daily. Patient not taking: Reported on 02/16/2021 12/27/20   Valinda Hoar, NP  hydrOXYzine (ATARAX/VISTARIL) 25 MG tablet Take 1 tablet (25 mg total) by mouth every 8 (eight) hours as needed for anxiety. 02/16/21   Raspet, Erin K, PA-C  ipratropium (ATROVENT) 0.06 % nasal spray Place 2 sprays into both nostrils 4 (four) times daily. Patient not taking: Reported on 02/16/2021 01/31/21   Belinda Fisher, PA-C  lisinopril (ZESTRIL) 10 MG tablet Take 1 tablet (10 mg  total) by mouth daily. Patient taking differently: Take 5 mg by mouth daily.  07/12/19   Elvina Sidle, MD  lisinopril (ZESTRIL) 20 MG tablet Take 20 mg by mouth at bedtime. 02/24/20   [provider]  lovastatin (MEVACOR) 10 MG tablet Take 10 mg by mouth at bedtime. Patient not taking: Reported on 02/16/2021 02/24/20   [provider]  neomycin-polymyxin b-dexamethasone (MAXITROL) 3.5-10000-0.1 OINT APPLY A SMALL AMOUNT IN THE CONJUNCTIVAL SAC IN LEFT EYE TWICE DAILY Patient not taking: Reported on 02/16/2021 12/11/19   [provider]  nystatin (MYCOSTATIN/NYSTOP) powder Apply 1 application topically 2 (two) times daily. Patient not taking: Reported on 02/16/2021 10/22/20   Rhys Martini, PA-C  nystatin cream (MYCOSTATIN) Apply to affected area 2 times  daily Patient not taking: Reported on 02/16/2021 10/22/20   Rhys Martini, PA-C  omeprazole (PRILOSEC) 20 MG capsule Take 1 capsule (20 mg total) by mouth daily. 02/26/21 03/28/21  Raspet, Noberto Retort, PA-C  promethazine (PHENERGAN) 25 MG tablet Take 25 mg by mouth every 8 (eight) hours as needed. Patient not taking: Reported on 02/16/2021 02/24/20   [provider]  promethazine-dextromethorphan (PROMETHAZINE-DM) 6.25-15 MG/5ML syrup Take 5 mLs by mouth 4 (four) times daily as needed for cough. Patient not taking: Reported on 02/16/2021 10/22/20   Rhys Martini, PA-C  tiZANidine (ZANAFLEX) 4 MG capsule Take 1 capsule (4 mg total) by mouth 3 (three) times daily as needed for muscle spasms. 06/03/21   Raspet, Noberto Retort, PA-C  Vitamin D, Ergocalciferol, (DRISDOL) 1.25 MG (50000 UNIT) CAPS capsule Take 1 capsule (50,000 Units total) by mouth every 7 (seven) days. Patient not taking: Reported on 02/16/2021 03/31/20   Dahlia Byes A, NP  Zinc Oxide (TRIPLE PASTE) 12.8 % ointment Apply 1 application topically as needed for irritation. Patient not taking: Reported on 02/16/2021 12/27/20   Valinda Hoar, NP  esomeprazole (NEXIUM) 20 MG capsule Take 20 mg by mouth daily. 02/24/20 10/22/20  [provider]  famotidine (PEPCID) 20 MG tablet Take 1 tablet (20 mg total) by mouth 2 (two) times daily. 03/07/20 10/22/20  Wallis Bamberg, PA-C  sucralfate (CARAFATE) 1 GM/10ML suspension Take 10 mLs (1 g total) by mouth 4 (four) times daily -  with meals and at bedtime. 12/22/18 03/02/19  Emi Holes, PA-C    Allergies    Ibuprofen, Other, and Zofran [ondansetron hcl]  Review of Systems   Review of Systems  Physical Exam Updated Vital Signs BP (!) 163/101 (BP Location: Right Arm)   Pulse 91   Temp 98.6 F (37 C)   Resp 17   SpO2 99%   Physical Exam Vitals and nursing note reviewed.  Constitutional:      General: He is not in acute distress.    Appearance: He is well-developed. He is not ill-appearing.   HENT:     Head: Normocephalic and atraumatic.     Comments: Right-sided cerumen impaction Eyes:     Conjunctiva/sclera: Conjunctivae normal.  Cardiovascular:     Rate and Rhythm: Normal rate.  Pulmonary:     Effort: Pulmonary effort is normal. No respiratory distress.  Abdominal:     General: There is no distension.  Musculoskeletal:     Cervical back: Neck supple.     Comments: Moves all extremities  Skin:    General: Skin is warm and dry.  Neurological:     Mental Status: He is alert and oriented to person, place, and time.  Psychiatric:  Mood and Affect: Mood normal.        Behavior: Behavior normal.    ED Results / Procedures / Treatments   Labs (all labs ordered are listed, but only abnormal results are displayed) Labs Reviewed - No data to display  EKG None  Radiology No results found.  Procedures .Ear Cerumen Removal  Date/Time: 07/13/2021 5:25 AM Performed by: Roxy Horseman, PA-C Authorized by: Roxy Horseman, PA-C   Consent:    Consent obtained:  Verbal   Consent given by:  Patient   Risks, benefits, and alternatives were discussed: yes     Risks discussed:  Pain, TM perforation and incomplete removal   Alternatives discussed:  No treatment Universal protocol:    Procedure explained and questions answered to patient or proxy's satisfaction: yes     Relevant documents present and verified: yes     Test results available: yes     Imaging studies available: yes     Required blood products, implants, devices, and special equipment available: yes     Site/side marked: yes     Immediately prior to procedure, a time out was called: yes     Patient identity confirmed:  Verbally with patient Procedure details:    Location:  R ear   Procedure type: curette     Procedure outcomes: cerumen removed   Post-procedure details:    Inspection:  No bleeding, TM intact and some cerumen remaining   Hearing quality:  Diminished   Procedure completion:   Tolerated well, no immediate complications Comments:     Cerumen was removed using a video otoscope, the tympanic membrane was not touched or disturbed   Medications Ordered in ED Medications - No data to display  ED Course  I have reviewed the triage vital signs and the nursing notes.  Pertinent labs & imaging results that were available during my care of the patient were reviewed by me and considered in my medical decision making (see chart for details).    MDM Rules/Calculators/A&P                           Patient here with right ear fullness.  He has cerumen impaction.  This was removed with a video otoscope, the tympanic membrane was not damaged in the process.  There was a small amount of cerumen that remains.  Recommend the patient follow-up with his doctor.  Patient given instructions regarding ear hygiene. Final Clinical Impression(s) / ED Diagnoses Final diagnoses:  Impacted cerumen of right ear    Rx / DC Orders ED Discharge Orders     None        Roxy Horseman, PA-C 07/13/21 0529    Roxy Horseman, PA-C 07/13/21 0530    Palumbo, April, MD 07/13/21 [phone]

## 2021-07-19 ENCOUNTER — Ambulatory Visit (HOSPITAL_COMMUNITY)
Admission: EM | Admit: 2021-07-19 | Discharge: 2021-07-19 | Disposition: A | Discharge status: Home / Self Care | Payer: Self-pay

## 2021-07-19 ENCOUNTER — Other Ambulatory Visit: Payer: Self-pay

## 2021-07-19 NOTE — ED Notes (Signed)
Called pt 3x no response °

## 2021-07-19 NOTE — ED Notes (Signed)
No answer x 2 in waiting area.

## 2021-08-29 ENCOUNTER — Encounter (HOSPITAL_COMMUNITY): Payer: Self-pay | Admitting: *Deleted

## 2021-08-29 ENCOUNTER — Other Ambulatory Visit: Payer: Self-pay

## 2021-08-29 ENCOUNTER — Emergency Department (HOSPITAL_COMMUNITY)
Admission: EM | Admit: 2021-08-29 | Discharge: 2021-08-29 | Disposition: A | Discharge status: Home / Self Care | Payer: Self-pay | Attending: Emergency Medicine | Admitting: Emergency Medicine

## 2021-08-29 DIAGNOSIS — Z5321 Procedure and treatment not carried out due to patient leaving prior to being seen by health care provider: Secondary | ICD-10-CM | POA: Insufficient documentation

## 2021-08-29 DIAGNOSIS — I10 Essential (primary) hypertension: Secondary | ICD-10-CM | POA: Insufficient documentation

## 2021-08-29 NOTE — ED Notes (Signed)
Pt called x3 to come back to triage for the MSE, no response.

## 2021-08-29 NOTE — ED Triage Notes (Signed)
Pt says he checked his blood pressure this morning and it was 170/100's. He missed his bp medications for about 2 days, he took them about 1 hour ago. He says he just feels tired. Denies pain

## 2021-09-25 ENCOUNTER — Other Ambulatory Visit: Payer: Self-pay

## 2021-09-25 ENCOUNTER — Encounter (HOSPITAL_COMMUNITY): Payer: Self-pay | Admitting: *Deleted

## 2021-09-25 ENCOUNTER — Ambulatory Visit (HOSPITAL_COMMUNITY)
Admission: EM | Admit: 2021-09-25 | Discharge: 2021-09-25 | Disposition: A | Discharge status: Home / Self Care | Payer: Self-pay | Attending: Physician Assistant | Admitting: Physician Assistant

## 2021-09-25 DIAGNOSIS — H6121 Impacted cerumen, right ear: Secondary | ICD-10-CM

## 2021-09-25 DIAGNOSIS — R21 Rash and other nonspecific skin eruption: Secondary | ICD-10-CM

## 2021-09-25 MED ORDER — CICLOPIROX OLAMINE 0.77 % EX CREA: TOPICAL_CREAM | Freq: Two times a day (BID) | CUTANEOUS | 0 refills | Status: DC

## 2021-09-25 NOTE — ED Triage Notes (Signed)
Pt reports object in rt ear with decreased hearing

## 2021-09-25 NOTE — ED Provider Notes (Signed)
[facility]    CSN: [id number] Arrival date & time: 09/25/21  1126      History   Chief Complaint Chief Complaint  Patient presents with   Foreign Body in Ear    Rt ear    HPI Wesley LitterJose [name] is a 56 y.o. male.   Patient presents today with a several day history of fullness and discomfort in his right ear.  He also reports decreased hearing as result.  He has a history of cerumen impaction and has required ear lavage as recently as November 2022.  He has not been putting anything in his ear and denies use of Q-tips, earbuds, earplugs.  Denies any recent swimming or airplane travel.  He denies recent illness or additional symptoms including fever, cough, congestion.  He denies any otorrhea or otalgia.  He has not tried any over-the-counter medication for symptom management.  In addition, patient reports a several month history of pruritic rash of his lower abdomen.  Initially thought this was related to his belt but stopped using his belt continues to have rash.  He denies any changes to personal hygiene products including soaps or detergents.  He has history of dermatological condition including eczema or psoriasis.  He has not tried any over-the-counter medication for symptom management.   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 Authorizing Provider  ciclopirox (LOPROX) 0.77 % cream Apply topically 2 (two) times daily. 09/25/21  Yes Arif Amendola, Noberto RetortErin K, PA-C  acetaminophen (TYLENOL) 500 MG tablet Take 1 tablet (500 mg total) by mouth every 6 (six) hours as needed. 12/19/18   Wallis BambergMani, Mario, PA-C  albuterol (VENTOLIN HFA) 108 (90 Base) MCG/ACT inhaler Inhale 1-2 puffs into the lungs every 6 (six) hours as needed for wheezing or shortness of breath. 10/22/20   Rhys MartiniGraham, Laura E, PA-C  amLODipine (NORVASC) 5 MG tablet Take 1 tablet (5 mg total) by mouth daily. Patient not taking: Reported on 02/16/2021 07/12/19   Elvina SidleLauenstein, Kurt, MD  benzonatate (TESSALON) 200 MG capsule Take 1 capsule (200 mg total) by mouth 3 (three) times daily as needed for cough. Patient not taking: Reported on 02/16/2021 01/31/21   Belinda FisherYu, Amy V, PA-C  fluocinolone (SYNALAR) 0.025 % ointment Apply topically 2 (two) times daily. Patient not taking: Reported on 02/16/2021 12/27/20   Valinda HoarWhite, Adrienne R, NP  hydrOXYzine (ATARAX/VISTARIL) 25 MG tablet Take 1 tablet (25 mg total) by mouth every 8 (eight) hours as needed for anxiety. 02/16/21   Kejon Feild, Denny PeonErin K, PA-C  ipratropium (ATROVENT)  0.06 % nasal spray Place 2 sprays into both nostrils 4 (four) times daily. Patient not taking: Reported on 02/16/2021 01/31/21   Belinda Fisher, PA-C  lisinopril (ZESTRIL) 10 MG tablet Take 1 tablet (10 mg total) by mouth daily. Patient taking differently: Take 5 mg by mouth daily.  07/12/19   Elvina Sidle, MD  lisinopril (ZESTRIL) 20 MG tablet Take 20 mg by mouth at bedtime. 02/24/20   [provider]  lovastatin (MEVACOR) 10 MG tablet Take 10 mg by mouth at bedtime. Patient not taking: Reported on 02/16/2021 02/24/20   [provider]  neomycin-polymyxin b-dexamethasone (MAXITROL) 3.5-10000-0.1 OINT APPLY A  SMALL AMOUNT IN THE CONJUNCTIVAL SAC IN LEFT EYE TWICE DAILY Patient not taking: Reported on 02/16/2021 12/11/19   [provider]  omeprazole (PRILOSEC) 20 MG capsule Take 1 capsule (20 mg total) by mouth daily. 02/26/21 03/28/21  Merlina Marchena, Noberto Retort, PA-C  promethazine (PHENERGAN) 25 MG tablet Take 25 mg by mouth every 8 (eight) hours as needed. Patient not taking: Reported on 02/16/2021 02/24/20   [provider]  promethazine-dextromethorphan (PROMETHAZINE-DM) 6.25-15 MG/5ML syrup Take 5 mLs by mouth 4 (four) times daily as needed for cough. Patient not taking: Reported on 02/16/2021 10/22/20   Rhys Martini, PA-C  tiZANidine (ZANAFLEX) 4 MG capsule Take 1 capsule (4 mg total) by mouth 3 (three) times daily as needed for muscle spasms. 06/03/21   Kory Panjwani, Noberto Retort, PA-C  Vitamin D, Ergocalciferol, (DRISDOL) 1.25 MG (50000 UNIT) CAPS capsule Take 1 capsule (50,000 Units total) by mouth every 7 (seven) days. Patient not taking: Reported on 02/16/2021 03/31/20   Dahlia Byes A, NP  Zinc Oxide (TRIPLE PASTE) 12.8 % ointment Apply 1 application topically as needed for irritation. Patient not taking: Reported on 02/16/2021 12/27/20   Valinda Hoar, NP  esomeprazole (NEXIUM) 20 MG capsule Take 20 mg by mouth daily. 02/24/20 10/22/20  [provider]  famotidine (PEPCID) 20 MG tablet Take 1 tablet (20 mg total) by mouth 2 (two) times daily. 03/07/20 10/22/20  Wallis Bamberg, PA-C  sucralfate (CARAFATE) 1 GM/10ML suspension Take 10 mLs (1 g total) by mouth 4 (four) times daily -  with meals and at bedtime. 12/22/18 03/02/19  Emi Holes, PA-C    Family History Family History  Problem Relation Age of Onset   Hypertension Mother    Hypertension Father    Colon cancer Neg Hx    Colon polyps Neg Hx    Esophageal cancer Neg Hx    Rectal cancer Neg Hx    Stomach cancer Neg Hx     Social History Social History   Tobacco Use   Smoking status: Former   Smokeless tobacco: Never  Haematologist Use: Never used  Substance Use Topics   Alcohol use: Yes    Comment: socially   Drug use: Not Currently     Allergies   Ibuprofen, Other, and Zofran [ondansetron hcl]   Review of Systems Review of Systems  Constitutional:  Negative for activity change, appetite change, fatigue and fever.  HENT:  Positive for ear pain (Fullness) and hearing loss. Negative for congestion, ear discharge, sinus pressure, sneezing and sore throat.   Respiratory:  Negative for cough and shortness of breath.   Gastrointestinal:  Negative for abdominal pain, diarrhea, nausea and vomiting.  Skin:  Positive for rash.  Neurological:  Negative for dizziness, light-headedness and headaches.    Physical Exam Triage Vital Signs ED Triage Vitals  Enc Vitals Group     BP 09/25/21 1145 (!) 151/83     Pulse Rate 09/25/21 1145 (!) 112     Resp 09/25/21 1145 20     Temp 09/25/21 1145 97.9 F (36.6 C)     Temp src --      SpO2 09/25/21 1145 96 %     Weight --      Height --      Head Circumference --      Peak Flow --      Pain Score 09/25/21 1143 0     Pain Loc --      Pain Edu? --      Excl. in GC? --    No data found.  Updated Vital Signs BP (!) 151/83    Pulse (!) 112    Temp 97.9 F (36.6 C)    Resp 20    SpO2 96%   Visual Acuity Right Eye Distance:   Left Eye Distance:   Bilateral Distance:    Right Eye Near:   Left Eye Near:    Bilateral Near:     Physical Exam Vitals reviewed.  Constitutional:      General: He is awake.     Appearance: Normal appearance. He is well-developed. He is not ill-appearing.     Comments: Very pleasant male appears stated age in no acute distress sitting comfortably in exam room  HENT:     Head: Normocephalic and atraumatic.     Right Ear: External ear normal. There is impacted cerumen.     Left Ear: Tympanic membrane, ear canal and external ear normal. Tympanic membrane is not erythematous or bulging.     Ears:     Comments: Cerumen impaction  noted right ear; patient left prior to reevaluation    Nose: Nose normal.     Mouth/Throat:     Pharynx: Uvula midline. No oropharyngeal exudate, posterior oropharyngeal erythema or uvula swelling.  Cardiovascular:     Rate and Rhythm: Normal rate and regular rhythm.     Heart sounds: Normal heart sounds, S1 normal and S2 normal. No murmur heard. Pulmonary:     Effort: Pulmonary effort is normal. No accessory muscle usage or respiratory distress.     Breath sounds: Normal breath sounds. No stridor. No wheezing, rhonchi or rales.     Comments: Clear to auscultation bilaterally Abdominal:     General: Bowel sounds are normal.     Palpations: Abdomen is soft.     Tenderness: There is no abdominal tenderness.  Skin:    Findings: Rash present. Rash is macular and papular.     Comments: Maculopapular rash with evidence of excoriation noted under pannus.  No bleeding or drainage noted.  Neurological:     Mental Status: He is alert.  Psychiatric:        Behavior: Behavior is cooperative.     UC Treatments / Results  Labs (all labs ordered are listed, but only abnormal results are displayed) Labs Reviewed - No data to display  EKG   Radiology No results found.  Procedures Procedures (including critical care time)  Medications Ordered in UC Medications - No data to display  Initial Impression / Assessment and Plan / UC Course  I have reviewed the triage vital signs and the nursing notes.  Pertinent labs & imaging results that were available during my care of the patient were reviewed by me and considered in my medical decision making (see chart for details).  Ear lavage was performed in clinic but before I was able to go reexamine patient he left without informing clinical staff.  During visit had recommended that he use over-the-counter medications including Debrox for symptom relief and avoid using anything in his ear such as Q-tips, earbuds, earplugs.  Patient was  prescribed Loprox and we encouraged him to use this twice daily for minimum of 2 weeks.  Recommended hypoallergenic soaps and detergents.  Discussed that if he has any worsening symptoms including spread of rash or systemic symptoms he needs to be reevaluated.  Final Clinical Impressions(s) / UC Diagnoses   Final diagnoses:  Impacted cerumen of right ear  Rash and nonspecific skin eruption   Discharge Instructions   None    ED Prescriptions     Medication Sig Dispense Auth. Provider   ciclopirox (LOPROX) 0.77 % cream Apply topically 2 (two) times daily. 30 g Daryus Sowash, Noberto RetortErin K, PA-C      PDMP not reviewed this encounter.   Jeani HawkingRaspet, Abbagail Scaff K, PA-C 09/25/21 1330

## 2021-11-18 ENCOUNTER — Other Ambulatory Visit: Payer: Self-pay

## 2021-11-18 ENCOUNTER — Ambulatory Visit (HOSPITAL_COMMUNITY)
Admission: EM | Admit: 2021-11-18 | Discharge: 2021-11-18 | Disposition: A | Discharge status: Home / Self Care | Payer: Self-pay | Attending: Family Medicine | Admitting: Family Medicine

## 2021-11-18 ENCOUNTER — Encounter (HOSPITAL_COMMUNITY): Payer: Self-pay | Admitting: Emergency Medicine

## 2021-11-18 DIAGNOSIS — M5412 Radiculopathy, cervical region: Secondary | ICD-10-CM

## 2021-11-18 DIAGNOSIS — M549 Dorsalgia, unspecified: Secondary | ICD-10-CM

## 2021-11-18 DIAGNOSIS — H6123 Impacted cerumen, bilateral: Secondary | ICD-10-CM

## 2021-11-18 MED ORDER — TIZANIDINE HCL 4 MG PO CAPS: 4.0000 mg | ORAL_CAPSULE | Freq: Three times a day (TID) | ORAL | 0 refills | Status: DC | PRN

## 2021-11-18 NOTE — ED Triage Notes (Signed)
Also reports neck pain for 1 week, pain shoots down arm ?

## 2021-11-18 NOTE — Discharge Instructions (Addendum)
You were seen today for ear wax in both ears.   ?She did clean out some of the wax, but you still have a lot of wax present.  I recommend using over the counter ear wax softener (cerumenex) for several weeks, and then you may return for full removal.  ? ?For your back/neck pain I have sent out a muscle relaxer.  This may make you tired, sleepy so please take when home and not driving.  You  may use a heating pad, as well as use motrin for pain.  Follow up with your primary care provider if not improving.  ?

## 2021-11-18 NOTE — ED Notes (Signed)
Partial ear wax removal performed with warm water and bottle. Large piece of wax removed, but wax still remains in canal. PT reports he feels better and requests that we stop at this time. Provider aware.  ?

## 2021-11-18 NOTE — ED Triage Notes (Signed)
?  PT reports right ear pain for 3 days. Believes there is something in ear.  ? ?Seen for same 1/15 ?

## 2021-11-18 NOTE — ED Provider Notes (Signed)
[place]    CSN: [id number] Arrival date & time: 11/18/21  [id number]      History   Chief Complaint Chief Complaint  Patient presents with   Otalgia   Neck Pain    HPI Wesley [name] is a 56 y.o. male.   Patient is here for difficulty hearing out of the right ear.  Going on for several days.  No pain noted.  No URI symptoms.  He has been seen here for this issue recently.  Irrigation was done, but wax was not able to be removed.  Recommended he use ear drops to help soften the wax.   Also with neck pain/shoulder pain.  Pain goes down the left arm.  Going on about 1 week.  No otc meds used.  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 Authorizing Provider  lisinopril (ZESTRIL) 20 MG tablet Take 20 mg by mouth at bedtime. 02/24/20  Yes [provider]  acetaminophen (TYLENOL) 500 MG tablet Take 1 tablet (500 mg total) by mouth every 6 (six) hours as needed. 12/19/18   Jaynee Eagles, PA-C  albuterol (VENTOLIN HFA) 108 (90 Base) MCG/ACT inhaler Inhale 1-2 puffs into the lungs every 6 (six) hours as needed  for wheezing or shortness of breath. 10/22/20   Hazel Sams, PA-C  amLODipine (NORVASC) 5 MG tablet Take 1 tablet (5 mg total) by mouth daily. Patient not taking: Reported on 02/16/2021 07/12/19   Robyn Haber, MD  benzonatate (TESSALON) 200 MG capsule Take 1 capsule (200 mg total) by mouth 3 (three) times daily as needed for cough. Patient not taking: Reported on 02/16/2021 01/31/21   Ok Edwards, PA-C  ciclopirox (LOPROX) 0.77 % cream Apply topically 2 (two) times daily. 09/25/21   Raspet, Derry Skill, PA-C  fluocinolone (SYNALAR) 0.025 % ointment Apply topically 2 (two) times daily. Patient not taking: Reported on 02/16/2021 12/27/20   Hans Eden, NP  hydrOXYzine (ATARAX/VISTARIL) 25 MG tablet Take 1 tablet (25 mg total) by mouth every 8 (eight) hours as needed for anxiety. 02/16/21   Raspet, Velda Wendt K, PA-C  ipratropium (ATROVENT) 0.06 % nasal spray Place 2 sprays into both nostrils 4 (four) times daily. Patient not taking: Reported on 02/16/2021 01/31/21   Ok Edwards, PA-C  lisinopril (ZESTRIL) 10 MG tablet Take 1 tablet (10 mg total) by mouth daily. Patient taking differently: Take 5 mg by mouth daily.  07/12/19   Robyn Haber,  MD  lovastatin (MEVACOR) 10 MG tablet Take 10 mg by mouth at bedtime. Patient not taking: Reported on 02/16/2021 02/24/20   [provider]  neomycin-polymyxin b-dexamethasone (MAXITROL) 3.5-10000-0.1 OINT APPLY A SMALL AMOUNT IN THE CONJUNCTIVAL SAC IN LEFT EYE TWICE DAILY Patient not taking: Reported on 02/16/2021 12/11/19   [provider]  omeprazole (PRILOSEC) 20 MG capsule Take 1 capsule (20 mg total) by mouth daily. 02/26/21 03/28/21  Raspet, Noberto Retort, PA-C  promethazine (PHENERGAN) 25 MG tablet Take 25 mg by mouth every 8 (eight) hours as needed. Patient not taking: Reported on 02/16/2021 02/24/20   [provider]  promethazine-dextromethorphan (PROMETHAZINE-DM) 6.25-15 MG/5ML syrup Take 5 mLs by mouth 4 (four) times daily as needed for cough. Patient  not taking: Reported on 02/16/2021 10/22/20   Rhys Martini, PA-C  tiZANidine (ZANAFLEX) 4 MG capsule Take 1 capsule (4 mg total) by mouth 3 (three) times daily as needed for muscle spasms. 06/03/21   Raspet, Noberto Retort, PA-C  Vitamin D, Ergocalciferol, (DRISDOL) 1.25 MG (50000 UNIT) CAPS capsule Take 1 capsule (50,000 Units total) by mouth every 7 (seven) days. Patient not taking: Reported on 02/16/2021 03/31/20   Dahlia Byes A, NP  Zinc Oxide (TRIPLE PASTE) 12.8 % ointment Apply 1 application topically as needed for irritation. Patient not taking: Reported on 02/16/2021 12/27/20   Valinda Hoar, NP  esomeprazole (NEXIUM) 20 MG capsule Take 20 mg by mouth daily. 02/24/20 10/22/20  [provider]  famotidine (PEPCID) 20 MG tablet Take 1 tablet (20 mg total) by mouth 2 (two) times daily. 03/07/20 10/22/20  Wallis Bamberg, PA-C  sucralfate (CARAFATE) 1 GM/10ML suspension Take 10 mLs (1 g total) by mouth 4 (four) times daily -  with meals and at bedtime. 12/22/18 03/02/19  Emi Holes, PA-C    Family History Family History  Problem Relation Age of Onset   Hypertension Mother    Hypertension Father    Colon cancer Neg Hx    Colon polyps Neg Hx    Esophageal cancer Neg Hx    Rectal cancer Neg Hx    Stomach cancer Neg Hx     Social History Social History   Tobacco Use   Smoking status: Former   Smokeless tobacco: Never  Building services engineer Use: Never used  Substance Use Topics   Alcohol use: Yes    Comment: socially   Drug use: Not Currently     Allergies   Ibuprofen, Other, and Zofran [ondansetron hcl]   Review of Systems Review of Systems  Constitutional: Negative.   HENT:  Positive for hearing loss.   Respiratory: Negative.    Cardiovascular: Negative.   Gastrointestinal: Negative.   Musculoskeletal:  Positive for back pain and neck pain.    Physical Exam Triage Vital Signs ED Triage Vitals  Enc Vitals Group     BP 11/18/21 1009 130/86     Pulse Rate 11/18/21 1009  77     Resp 11/18/21 1009 16     Temp 11/18/21 1009 98 F (36.7 C)     Temp Source 11/18/21 1009 Oral     SpO2 11/18/21 1009 94 %     Weight --      Height --      Head Circumference --      Peak Flow --      Pain Score 11/18/21 1008 4     Pain Loc --      Pain Edu? --  Excl. in GC? --    No data found.  Updated Vital Signs BP 130/86    Pulse 77    Temp 98 F (36.7 C) (Oral)    Resp 16    SpO2 94%   Visual Acuity Right Eye Distance:   Left Eye Distance:   Bilateral Distance:    Right Eye Near:   Left Eye Near:    Bilateral Near:     Physical Exam Constitutional:      Appearance: Normal appearance.  HENT:     Head: Normocephalic and atraumatic.     Right Ear: There is impacted cerumen.     Left Ear: There is impacted cerumen.  Cardiovascular:     Rate and Rhythm: Normal rate and regular rhythm.  Pulmonary:     Effort: Pulmonary effort is normal.     Breath sounds: Normal breath sounds.  Musculoskeletal:     Cervical back: Normal range of motion.     Comments: No spinous tenderness;  TTP at the left upper trapezius, left posterior neck;  decreased rom at the left shoulder due to pain;  c/o pain down the left arm with movement of the shoulder;   Neurological:     Mental Status: He is alert.   After irrigation of right ear small amount of wax removed;  patient asked to stop.  Still with +cerumen bilaterally.   UC Treatments / Results  Labs (all labs ordered are listed, but only abnormal results are displayed) Labs Reviewed - No data to display  EKG   Radiology No results found.  Procedures Procedures (including critical care time  Medications Ordered in UC Medications - No data to display  Initial Impression / Assessment and Plan / UC Course  I have reviewed the triage vital signs and the nursing notes.  Pertinent labs & imaging results that were available during my care of the patient were reviewed by me and considered in my medical decision  making (see chart for details).     Final Clinical Impressions(s) / UC Diagnoses   Final diagnoses:  Bilateral impacted cerumen  Upper back pain on left side  Cervical radiculopathy     Discharge Instructions      You were seen today for ear wax in both ears.   She did clean out some of the wax, but you still have a lot of wax present.  I recommend using over the counter ear wax softener (cerumenex) for several weeks, and then you may return for full removal.   For your back/neck pain I have sent out a muscle relaxer.  This may make you tired, sleepy so please take when home and not driving.  You  may use a heating pad, as well as use motrin for pain.  Follow up with your primary care provider if not improving.     ED Prescriptions     Medication Sig Dispense Auth. Provider   tiZANidine (ZANAFLEX) 4 MG capsule Take 1 capsule (4 mg total) by mouth 3 (three) times daily as needed for muscle spasms. 21 capsule Rondel Oh, MD      PDMP not reviewed this encounter.   Rondel Oh, MD 11/18/21 1134

## 2022-01-06 ENCOUNTER — Ambulatory Visit (HOSPITAL_COMMUNITY)
Admission: EM | Admit: 2022-01-06 | Discharge: 2022-01-06 | Disposition: A | Discharge status: Home / Self Care | Payer: Self-pay | Attending: Family Medicine | Admitting: Family Medicine

## 2022-01-06 NOTE — ED Triage Notes (Signed)
Pt called in front lobby by 2 staff members no answer ?

## 2022-01-06 NOTE — ED Notes (Signed)
Pt called by multiple staff members , no answer from lobby.  ?

## 2022-01-11 ENCOUNTER — Encounter: Payer: Self-pay | Admitting: Physician Assistant

## 2022-01-20 NOTE — Progress Notes (Deleted)
01/20/2022 Wesley [name] [id number] September 08, 1966  Referring provider: No ref. provider found, appears patient has no PCP, multiple ER visits. Primary GI doctor: {acdocs:27040}  ASSESSMENT AND PLAN:   There are no diagnoses linked to this encounter.   Patient Care Team: Pcp, No as PCP - General  HISTORY OF PRESENT ILLNESS: 56 y.o. male with a past medical history of hypertension, prediabetes, legally blind in right eye, history of noncompliance on problem list and others listed below presents for evaluation of abdominal pain Question Wesley [name] medical visit with Dr. Elnoria Howard 05/23/2021.  Current Medications:    Current Outpatient Medications (Cardiovascular):    amLODipine (NORVASC) 5 MG tablet, Take 1 tablet (5 mg total) by mouth daily. (Patient not taking: Reported on 02/16/2021)   lisinopril (ZESTRIL) 10 MG tablet, Take 1 tablet (10 mg total) by mouth daily. (Patient taking differently: Take 5 mg by mouth daily. )   lovastatin (MEVACOR) 10 MG tablet, Take 10 mg by mouth at bedtime. (Patient not taking: Reported on 02/16/2021)  Current Outpatient Medications (Respiratory):    albuterol (VENTOLIN HFA) 108 (90 Base) MCG/ACT inhaler, Inhale 1-2 puffs into the lungs every 6 (six) hours as needed for wheezing or shortness of breath.  Current Outpatient Medications (Analgesics):    acetaminophen (TYLENOL) 500 MG tablet, Take 1 tablet (500 mg total) by mouth every 6 (six) hours as needed.   Current Outpatient Medications (Other):    ciclopirox (LOPROX) 0.77 % cream, Apply topically 2 (two) times daily.   hydrOXYzine (ATARAX/VISTARIL) 25 MG tablet, Take 1 tablet (25 mg total) by mouth every 8 (eight) hours as needed for anxiety.   omeprazole (PRILOSEC) 20 MG capsule, Take 1 capsule (20 mg total) by mouth daily.   tiZANidine (ZANAFLEX) 4 MG capsule, Take 1 capsule (4 mg total) by mouth 3 (three) times daily as needed for muscle spasms.  Medical History:  Past Medical History:  Diagnosis  Date   Alcohol abuse    Anxiety    Anxiety    Arthritis    Chronic neck pain    Cocaine abuse (HCC)    Dizziness    GERD (gastroesophageal reflux disease)    Hypercholesteremia    Hypertension    Prediabetes    Allergies:  Allergies  Allergen Reactions   Ibuprofen Other (See Comments) and Rash    Makes his throat get "very dry" after taking it States he cant take it   Other Other (See Comments)    Steroids: Patient received a steroid shot from [facility] [phone] and it resulted in his muscles twitching all over his body   Zofran [Ondansetron Hcl] Other (See Comments)    Causes sweating and body "feels badly"     Surgical History:  He  has a past surgical history that includes Eye surgery (Right, ~ 1980). Family History:  His family history includes Hypertension in his father and mother. Social History:   reports that he has quit smoking. He has never used smokeless tobacco. He reports current alcohol use. He reports that he does not currently use drugs.  REVIEW OF SYSTEMS  : All other systems reviewed and negative except where noted in the History of Present Illness.   PHYSICAL EXAM: There were no vitals taken for this visit. General:   Pleasant, well developed male in no acute distress Head:   Normocephalic and atraumatic. Eyes:  {sclerae:26738},conjunctive {conjuctiva:26739}  Heart:   {HEART EXAM HEM/ONC:21750} Pulm:  Clear anteriorly; no wheezing Abdomen:   {  BlankSingle:19197::"Distended","Ridged","Soft"}, {BlankSingle:19197::"Flat","Obese","Non-distended"} AB, {BlankSingle:19197::"Absent","Hyperactive, tinkling","Hypoactive","Sluggish","Active"} bowel sounds. {actendernessAB:27319} tenderness {anatomy; site abdomen:5010}. {BlankMultiple:19196::"Without guarding","With guarding","Without rebound","With rebound"}, No organomegaly appreciated. Rectal: {acrectalexam:27461} Extremities:  {With/Without:304960234} edema. Msk: Symmetrical without gross  deformities. Peripheral pulses intact.  Neurologic:  Alert and  oriented x4;  No focal deficits.  Skin:   Dry and intact without significant lesions or rashes. Psychiatric:  Cooperative. Normal mood and affect.    Doree Albee, PA-C 8:58 AM

## 2022-01-23 ENCOUNTER — Ambulatory Visit: Payer: Self-pay | Admitting: Physician Assistant

## 2022-02-06 ENCOUNTER — Ambulatory Visit (HOSPITAL_COMMUNITY)
Admission: EM | Admit: 2022-02-06 | Discharge: 2022-02-06 | Discharge status: Against Medical Advice | Payer: BC Managed Care – PPO

## 2022-02-06 NOTE — ED Notes (Signed)
Attempted to call for patient-no answer.

## 2022-02-06 NOTE — ED Notes (Signed)
Call patient from the lobby- no answer.

## 2022-02-08 ENCOUNTER — Ambulatory Visit (HOSPITAL_COMMUNITY)
Admission: EM | Admit: 2022-02-08 | Discharge: 2022-02-08 | Disposition: A | Discharge status: Home / Self Care | Payer: BC Managed Care – PPO | Attending: Internal Medicine | Admitting: Internal Medicine

## 2022-02-08 ENCOUNTER — Other Ambulatory Visit: Payer: Self-pay

## 2022-02-08 ENCOUNTER — Encounter (HOSPITAL_COMMUNITY): Payer: Self-pay | Admitting: Emergency Medicine

## 2022-02-08 DIAGNOSIS — L230 Allergic contact dermatitis due to metals: Secondary | ICD-10-CM

## 2022-02-08 DIAGNOSIS — H6123 Impacted cerumen, bilateral: Secondary | ICD-10-CM | POA: Diagnosis not present

## 2022-02-08 DIAGNOSIS — J069 Acute upper respiratory infection, unspecified: Secondary | ICD-10-CM

## 2022-02-08 MED ORDER — GUAIFENESIN ER 1200 MG PO TB12: 1200.0000 mg | ORAL_TABLET | Freq: Two times a day (BID) | ORAL | 0 refills | Status: DC

## 2022-02-08 MED ORDER — TRIAMCINOLONE ACETONIDE 0.1 % EX CREA
1.0000 | TOPICAL_CREAM | Freq: Two times a day (BID) | CUTANEOUS | 0 refills | Status: DC
Start: 2022-02-08 — End: 2022-02-10

## 2022-02-08 MED ORDER — BENZONATATE 100 MG PO CAPS
100.0000 mg | ORAL_CAPSULE | Freq: Three times a day (TID) | ORAL | 0 refills | Status: DC
Start: 2022-02-08 — End: 2022-02-10

## 2022-02-08 NOTE — ED Triage Notes (Signed)
Coughing frequently.  Has had body aches.  Reports taking tylenol for this and it does help.  Requesting medicine for cough.  Onset 2 days ago

## 2022-02-08 NOTE — ED Provider Notes (Addendum)
[facility]    CSN: [id number] Arrival date & time: 02/08/22  0855      History   Chief Complaint Chief Complaint  Patient presents with   Cough    HPI Wesley [name] is a 56 y.o. male.   Patient presents with a 2-day history of cough, nasal congestion, and body aches.  He denies fever and chills at home.  Denies nausea, vomiting, diarrhea, constipation, dizziness, urinary symptoms, and abdominal pain.  He denies history of allergic rhinitis.  Cough is productive with yellow and white phlegm.  Nasal congestion and drainage is clear/white.  He states that he was in contact with someone with similar symptoms a couple of days ago and believes that he got it from them.  He denies history of tobacco use and is not a current smoker.  Denies asthma and COPD history.  No other aggravating or relieving factors identified at this time.    Patient also requesting evaluation of rash that he has had for the last few months to his left thigh and anterior lower abdomen to the suprapubic area. He states this rash is very itchy and he has not placed any over the counter medication on the rash. He denies recent exposure to known poison ivy/oak or new personal hygiene products. Denies trauma to area and bug bites. No rash between his fingers or toes. Denies itching to his scalp.     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 Authorizing Provider  benzonatate (TESSALON) 100 MG capsule Take 1 capsule (100 mg total) by mouth every 8 (eight) hours. 02/08/22  Yes Carlisle BeersStanhope, Stefan Karen M, FNP  Guaifenesin 1200 MG TB12 Take 1 tablet (1,200 mg total) by mouth in the morning and at bedtime. 02/08/22  Yes Carlisle BeersStanhope, Madolyn Ackroyd M, FNP  triamcinolone cream (KENALOG) 0.1 % Apply 1 application. topically 2 (two) times daily. 02/08/22  Yes Carlisle BeersStanhope, Angelo Prindle M, FNP  acetaminophen (TYLENOL) 500 MG tablet Take 1 tablet (500 mg total) by mouth every 6 (six) hours as needed. 12/19/18   Wallis BambergMani, Mario, PA-C  albuterol (VENTOLIN HFA) 108 (90 Base) MCG/ACT inhaler Inhale 1-2 puffs into the lungs every 6 (six) hours as needed for wheezing or shortness of breath. Patient not taking: Reported on 02/08/2022 10/22/20   Rhys MartiniGraham, Laura E, PA-C  amLODipine (NORVASC) 5 MG tablet Take 1 tablet (5 mg total) by mouth daily. Patient not taking: Reported on 02/16/2021 07/12/19   Elvina SidleLauenstein, Kurt, MD  ciclopirox (LOPROX) 0.77 % cream Apply topically 2 (  two) times daily. 09/25/21   Raspet, Noberto Retort, PA-C  hydrOXYzine (ATARAX/VISTARIL) 25 MG tablet Take 1 tablet (25 mg total) by mouth every 8 (eight) hours as needed for anxiety. Patient not taking: Reported on 02/08/2022 02/16/21   Raspet, Denny Peon K, PA-C  lisinopril (ZESTRIL) 10 MG tablet Take 1 tablet (10 mg total) by mouth daily. Patient taking differently: Take 5 mg by mouth daily.  07/12/19   Elvina Sidle, MD  lovastatin (MEVACOR) 10 MG tablet Take 10 mg by mouth at bedtime. Patient not taking: Reported on 02/16/2021 02/24/20   [provider]  omeprazole (PRILOSEC) 20 MG capsule Take 1 capsule (20 mg total) by mouth daily. Patient not taking: Reported  on 02/08/2022 02/26/21 03/28/21  Raspet, Noberto Retort, PA-C  tiZANidine (ZANAFLEX) 4 MG capsule Take 1 capsule (4 mg total) by mouth 3 (three) times daily as needed for muscle spasms. Patient not taking: Reported on 02/08/2022 11/18/21   Jannifer Franklin, MD  esomeprazole (NEXIUM) 20 MG capsule Take 20 mg by mouth daily. 02/24/20 10/22/20  [provider]  famotidine (PEPCID) 20 MG tablet Take 1 tablet (20 mg total) by mouth 2 (two) times daily. 03/07/20 10/22/20  Wallis Bamberg, PA-C  sucralfate (CARAFATE) 1 GM/10ML suspension Take 10 mLs (1 g total) by mouth 4 (four) times daily -  with meals and at bedtime. 12/22/18 03/02/19  Emi Holes, PA-C    Family History Family History  Problem Relation Age of Onset   Hypertension Mother    Hypertension Father    Colon cancer Neg Hx    Colon polyps Neg Hx    Esophageal cancer Neg Hx    Rectal cancer Neg Hx    Stomach cancer Neg Hx     Social History Social History   Tobacco Use   Smoking status: Former   Smokeless tobacco: Never  Building services engineer Use: Never used  Substance Use Topics   Alcohol use: Yes    Comment: socially   Drug use: Not Currently     Allergies   Ibuprofen, Other, and Zofran [ondansetron hcl]   Review of Systems Review of Systems Per HPI  Physical Exam Triage Vital Signs ED Triage Vitals  Enc Vitals Group     BP 02/08/22 0909 (!) 155/96     Pulse Rate 02/08/22 0909 94     Resp 02/08/22 0909 (!) 22     Temp 02/08/22 0909 98.3 F (36.8 C)     Temp Source 02/08/22 0909 Oral     SpO2 02/08/22 0909 96 %     Weight --      Height --      Head Circumference --      Peak Flow --      Pain Score 02/08/22 0906 4     Pain Loc --      Pain Edu? --      Excl. in GC? --    No data found.  Updated Vital Signs BP (!) 155/96 (BP Location: Left Arm)   Pulse 94   Temp 98.3 F (36.8 C) (Oral)   Resp (!) 22   SpO2 96%   Visual Acuity Right Eye Distance:   Left Eye Distance:   Bilateral Distance:    Right  Eye Near:   Left Eye Near:    Bilateral Near:     Physical Exam Vitals and nursing note reviewed.  Constitutional:      General: He is not in acute distress.  Appearance: Normal appearance. He is well-developed. He is obese. He is not ill-appearing or toxic-appearing.     Comments: Very pleasant patient sitting comfortably on exam able in no acute distress.   HENT:     Head: Normocephalic and atraumatic.     Right Ear: Hearing and external ear normal. There is impacted cerumen.     Left Ear: Hearing and external ear normal. There is impacted cerumen.     Nose: Congestion present.     Mouth/Throat:     Mouth: Mucous membranes are moist.     Pharynx: No posterior oropharyngeal erythema.  Eyes:     General: Lids are normal. Vision grossly intact. Gaze aligned appropriately.     Extraocular Movements: Extraocular movements intact.     Conjunctiva/sclera: Conjunctivae normal.     Right eye: Right conjunctiva is not injected.     Left eye: Left conjunctiva is not injected.     Pupils: Pupils are equal, round, and reactive to light.  Cardiovascular:     Rate and Rhythm: Normal rate and regular rhythm.     Heart sounds: Normal heart sounds, S1 normal and S2 normal. No murmur heard.   No friction rub. No gallop.  Pulmonary:     Effort: Pulmonary effort is normal. No respiratory distress.     Breath sounds: Normal breath sounds. No decreased air movement.  Abdominal:     General: There is no distension.     Palpations: Abdomen is soft.     Tenderness: There is no abdominal tenderness.  Musculoskeletal:        General: No swelling.     Cervical back: Neck supple.     Right lower leg: No edema.     Left lower leg: No edema.  Lymphadenopathy:     Cervical: No cervical adenopathy.  Skin:    General: Skin is warm and dry.     Capillary Refill: Capillary refill takes less than 2 seconds.     Findings: No rash.     Comments: Contact nickel dermatitis noted to patient's belt buckle  area.  Area of clustered erythematous papules with small amount of drainage noted to patient's left anterior thigh that is very itchy. No rash between fingers or toes. No rash to scalp.   Neurological:     General: No focal deficit present.     Mental Status: He is alert and oriented to person, place, and time. Mental status is at baseline.     Motor: No weakness.     Gait: Gait is intact. Gait normal.  Psychiatric:        Attention and Perception: Attention and perception normal.        Mood and Affect: Mood normal.        Speech: Speech normal.        Behavior: Behavior normal. Behavior is cooperative.        Thought Content: Thought content normal.        Cognition and Memory: Cognition and memory normal.        Judgment: Judgment normal.         UC Treatments / Results  Labs (all labs ordered are listed, but only abnormal results are displayed) Labs Reviewed - No data to display  EKG   Radiology No results found.  Procedures Procedures (including critical care time)  Bilateral cerumen removal by ear lavage performed by nursing staff at today's visit.   Medications Ordered in UC Medications - No data to display  Initial Impression / Assessment and Plan / UC Course  I have reviewed the triage vital signs and the nursing notes.  Pertinent labs & imaging results that were available during my care of the patient were reviewed by me and considered in my medical decision making (see chart for details).  Patient is a 56 year old male presenting to urgent care with viral upper respiratory infection symptoms.  Low suspicion for COVID-19 or influenza at this time as he is afebrile and is not ill appearing in clinic at this time.  He was recently exposed to a coworker with similar symptoms that resolved in a timely manner with supportive care prescriptions.  Deferred imaging today based on stable cardiopulmonary exam and vital signs.  Plan to treat viral upper respiratory  infection with supportive care prescriptions for Tessalon Perles to be taken every 8 hours as needed for cough and guaifenesin to be taken twice daily.  Encouraged increased water intake to further thin mucus so that he can cough it up easier.   Patient also has a bilateral cerumen impaction today.  Ears cleaned by nursing staff today and patient reports improvement in ear pain. Tympanic membranes normal post cerumen disimpaction.  Contact dermatitis related to patient's nickel belt noted to his suprapubic abdominal area where his belt rubs his skin.  Plan to treat dermatitis with triamcinolone cream to be applied twice daily for the next 7 to 10 days.  Patient instructed to wear different belt that does not contain nickel on the belt buckle.  He may also apply triamcinolone cream to his left thigh to treat possible eczema/folliculitis for 7 to 10 days as well.   Work note for 2 days given for him to rest, drink plenty of fluids, and recover from viral upper respiratory infection.  Counseled patient regarding appropriate use of medications and potential side effects for all medications recommended or prescribed today. Discussed red flag signs and symptoms of worsening condition,when to call the PCP office, return to urgent care, and when to seek higher level of care. Patient verbalizes understanding and agreement with plan. All questions answered. Patient discharged in stable condition.  Final Clinical Impressions(s) / UC Diagnoses   Final diagnoses:  Viral URI with cough  Contact dermatitis due to nickel  Bilateral impacted cerumen     Discharge Instructions      You were seen in urgent care for your viral upper respiratory infection.  Take guaifenesin to thin your mucus and be able to cough it up easier/blow it out of your nose easier twice daily for the next few days until your symptoms get better.  Drink plenty of water while taking this medication to help at work better in your body.  Take  Tessalon Perles every 8 hours as needed for cough.  You may continue to take Tylenol at home as needed for pain and fever.  Your rash to your stomach is due to your nickel belt.  Do not wear this belt anymore and apply triamcinolone cream twice daily to area above your belt buckle for the next 7 to 10 days.  You may also apply this ointment to your left knee to reduce irritation and itch.  If you develop any new or worsening symptoms or do not improve in the next 2 to 3 days, please return.  If your symptoms are severe, please go to the emergency room.  Follow-up with your primary care provider for further evaluation and management of your symptoms as well as ongoing wellness visits.  I hope you feel better!    ED Prescriptions     Medication Sig Dispense Auth. Provider   Guaifenesin 1200 MG TB12 Take 1 tablet (1,200 mg total) by mouth in the morning and at bedtime. 14 tablet Reita May M, FNP   benzonatate (TESSALON) 100 MG capsule Take 1 capsule (100 mg total) by mouth every 8 (eight) hours. 21 capsule Reita May M, FNP   triamcinolone cream (KENALOG) 0.1 % Apply 1 application. topically 2 (two) times daily. 30 g Carlisle Beers, FNP      PDMP not reviewed this encounter.   Carlisle Beers, FNP 02/08/22 1032    Reita May [place], [place] 02/15/22 1210

## 2022-02-08 NOTE — Discharge Instructions (Addendum)
You were seen in urgent care for your viral upper respiratory infection.  Take guaifenesin to thin your mucus and be able to cough it up easier/blow it out of your nose easier twice daily for the next few days until your symptoms get better.  Drink plenty of water while taking this medication to help at work better in your body.  Take Tessalon Perles every 8 hours as needed for cough.  You may continue to take Tylenol at home as needed for pain and fever.  Your rash to your stomach is due to your nickel belt.  Do not wear this belt anymore and apply triamcinolone cream twice daily to area above your belt buckle for the next 7 to 10 days.  You may also apply this ointment to your left knee to reduce irritation and itch.  If you develop any new or worsening symptoms or do not improve in the next 2 to 3 days, please return.  If your symptoms are severe, please go to the emergency room.  Follow-up with your primary care provider for further evaluation and management of your symptoms as well as ongoing wellness visits.  I hope you feel better!

## 2022-02-09 ENCOUNTER — Ambulatory Visit: Payer: BC Managed Care – PPO | Admitting: Physician Assistant

## 2022-02-09 ENCOUNTER — Emergency Department (HOSPITAL_COMMUNITY)
Admission: EM | Admit: 2022-02-09 | Discharge: 2022-02-09 | Discharge status: Against Medical Advice | Payer: BC Managed Care – PPO | Attending: Emergency Medicine | Admitting: Emergency Medicine

## 2022-02-09 ENCOUNTER — Encounter (HOSPITAL_COMMUNITY): Payer: Self-pay

## 2022-02-09 ENCOUNTER — Other Ambulatory Visit: Payer: Self-pay

## 2022-02-09 DIAGNOSIS — Z5321 Procedure and treatment not carried out due to patient leaving prior to being seen by health care provider: Secondary | ICD-10-CM | POA: Insufficient documentation

## 2022-02-09 DIAGNOSIS — H538 Other visual disturbances: Secondary | ICD-10-CM | POA: Insufficient documentation

## 2022-02-09 LAB — CBC
HCT: 41.8 % (ref 39.0–52.0)
Hemoglobin: 13.4 g/dL (ref 13.0–17.0)
MCH: 28.3 pg (ref 26.0–34.0)
MCHC: 32.1 g/dL (ref 30.0–36.0)
MCV: 88.4 fL (ref 80.0–100.0)
Platelets: 269 10*3/uL (ref 150–400)
RBC: 4.73 MIL/uL (ref 4.22–5.81)
RDW: 12.8 % (ref 11.5–15.5)
WBC: 6.3 10*3/uL (ref 4.0–10.5)
nRBC: 0 % (ref 0.0–0.2)

## 2022-02-09 LAB — BASIC METABOLIC PANEL
Anion gap: 8 (ref 5–15)
BUN: 9 mg/dL (ref 6–20)
CO2: 26 mmol/L (ref 22–32)
Calcium: 8.7 mg/dL — ABNORMAL LOW (ref 8.9–10.3)
Chloride: 101 mmol/L (ref 98–111)
Creatinine, Ser: 0.79 mg/dL (ref 0.61–1.24)
GFR, Estimated: >60 mL/min (ref >60)
Glucose, Bld: 202 mg/dL — ABNORMAL HIGH (ref 70–99)
Potassium: 3.9 mmol/L (ref 3.5–5.1)
Sodium: 135 mmol/L (ref 135–145)

## 2022-02-09 NOTE — ED Notes (Signed)
Went to assess pt and pt had left without informing staff.

## 2022-02-09 NOTE — ED Provider Triage Note (Signed)
Emergency Medicine Provider Triage Evaluation Note  Wesley [name] , a 56 y.o. male  was evaluated in triage.  Pt complains of blurred vision.  Patient reports he was on his way to work this morning when he had sudden onset of blurred vision.  Patient reports that his right eye "does not work" however he cannot tell me why.  Patient reports compliance on home blood pressure medications besides Tuesday when he missed a dose.  Patient denies chest pain, shortness of breath, headache, nausea, vomiting, fevers.  Review of Systems  Positive:  Negative:   Physical Exam  BP (!) 165/98 (BP Location: Right Arm)   Pulse (!) 108   Temp 98.7 F (37.1 C) (Oral)   Resp 16   Ht 5\' 4"  (1.626 m)   Wt 95.3 kg   SpO2 100%   BMI 36.05 kg/m  Gen:   Awake, no distress   Resp:  Normal effort  MSK:   Moves extremities without difficulty  Other:  No focal neurodeficits on examination.  Patient visual acuity intact.  Medical Decision Making  Medically screening exam initiated at 1:07 PM.  Appropriate orders placed.  Wesley [name] was informed that the remainder of the evaluation will be completed by another provider, this initial triage assessment does not replace that evaluation, and the importance of remaining in the ED until their evaluation is complete.     , PA-C 02/09/22 1307

## 2022-02-09 NOTE — ED Triage Notes (Signed)
Pt arrived POV from work stating he was driving and his vision became blurry. Pt states it started about 30 mins ago.

## 2022-02-10 ENCOUNTER — Encounter (HOSPITAL_COMMUNITY): Payer: Self-pay | Admitting: Emergency Medicine

## 2022-02-10 ENCOUNTER — Ambulatory Visit (HOSPITAL_COMMUNITY)
Admission: EM | Admit: 2022-02-10 | Discharge: 2022-02-10 | Disposition: A | Discharge status: Home / Self Care | Payer: BC Managed Care – PPO | Attending: Physician Assistant | Admitting: Physician Assistant

## 2022-02-10 DIAGNOSIS — J069 Acute upper respiratory infection, unspecified: Secondary | ICD-10-CM | POA: Diagnosis not present

## 2022-02-10 DIAGNOSIS — R051 Acute cough: Secondary | ICD-10-CM

## 2022-02-10 DIAGNOSIS — R7309 Other abnormal glucose: Secondary | ICD-10-CM

## 2022-02-10 DIAGNOSIS — Z87898 Personal history of other specified conditions: Secondary | ICD-10-CM

## 2022-02-10 LAB — POCT URINALYSIS DIPSTICK, ED / UC
Bilirubin Urine: NEGATIVE
Glucose, UA: NEGATIVE mg/dL
Hgb urine dipstick: NEGATIVE
Ketones, ur: NEGATIVE mg/dL
Leukocytes,Ua: NEGATIVE
Nitrite: NEGATIVE
Protein, ur: NEGATIVE mg/dL
Specific Gravity, Urine: 1.015 (ref 1.005–1.030)
Urobilinogen, UA: 0.2 mg/dL (ref 0.0–1.0)
pH: 7.5 (ref 5.0–8.0)

## 2022-02-10 LAB — CBG MONITORING, ED: Glucose-Capillary: 101 mg/dL — ABNORMAL HIGH (ref 70–99)

## 2022-02-10 MED ORDER — PROMETHAZINE-DM 6.25-15 MG/5ML PO SYRP: 5.0000 mL | ORAL_SOLUTION | Freq: Three times a day (TID) | ORAL | 0 refills | Status: DC | PRN

## 2022-02-10 NOTE — ED Triage Notes (Signed)
Patient c/o productive cough with "white" sputum x 4 days.   Patient denies Chest Pain.  Patient endorses SOB upon exertion.   Patient endorses an episode of blurry vision yesterday when driving.   Patient was seen at [facility] and given Tessalon pearls with no relief of symptoms.   Patient endorses history of Pre Diabetes.

## 2022-02-10 NOTE — ED Provider Notes (Signed)
[facility]    CSN: [id number] Arrival date & time: 02/10/22  0915      History   Chief Complaint Chief Complaint  Patient presents with   Cough    HPI Wesley [name] is a 56 y.o. male.   Patient presents today with several concerns.  He reports a 4-day history of URI symptoms including cough and nasal congestion.  He was seen by our clinic on 02/08/2022 at which point symptoms were attributed to URI.  He was seen again in the emergency room 02/09/2022 at which point he was given [employer] Perles.  He reports these are too strong and he is requesting a refill of Promethazine DM that he has been given in the past to manage cough symptoms as they are worse at night.  He is not using any over-the-counter medication for symptom management.  Denies any significant chest pain, shortness of breath, nausea, vomiting.  He denies history of asthma, allergies, COPD.  He does not smoke.  In addition, patient reports that he had an episode of blurred vision yesterday that prompted him to go to the emergency room.  At that time he was evaluated in triage and BMP was obtained with glucose of 202.  He does have a history of prediabetes but denies history of diabetes.  Last A1c 6.2% June 2019.  He has never taken diabetes medication in the past.  He reports that episode of blurred vision resolved spontaneously and he has not had any ongoing symptoms.  He does report polydipsia and polyphagia.  He is not currently followed by PCP.  He did not stay to be evaluated by ER as symptoms resolved.   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 Authorizing Provider  lisinopril (ZESTRIL) 10 MG tablet Take 1 tablet (10 mg total) by mouth daily. Patient taking differently: Take 5 mg by mouth daily. 07/12/19  Yes Elvina SidleLauenstein, Kurt, MD  promethazine-dextromethorphan (PROMETHAZINE-DM) 6.25-15 MG/5ML syrup Take 5 mLs by mouth 3 (three) times daily as needed for cough. 02/10/22  Yes Macil Crady K, PA-C  ciclopirox (LOPROX) 0.77 % cream Apply topically 2 (two) times daily. 09/25/21   Mackena Plummer, Noberto RetortErin K, PA-C  hydrOXYzine (ATARAX/VISTARIL) 25 MG tablet Take 1 tablet (25 mg total) by mouth every 8 (eight) hours as needed for anxiety. Patient not taking: Reported on 02/08/2022 02/16/21   Daionna Crossland, Noberto RetortErin K, PA-C  omeprazole (PRILOSEC) 20 MG capsule Take 1 capsule (20 mg total) by mouth daily. Patient not taking: Reported on 02/08/2022 02/26/21 03/28/21  Bianna Haran K, PA-C  esomeprazole (NEXIUM) 20 MG capsule Take 20 mg by mouth daily. 02/24/20 10/22/20  [provider]  famotidine (PEPCID) 20 MG tablet Take 1 tablet (20 mg total) by mouth 2 (two) times daily. 03/07/20 10/22/20  Wallis Bamberg, PA-C  sucralfate (CARAFATE) 1 GM/10ML suspension Take 10 mLs (1 g total) by mouth 4 (four) times daily -  with meals and at bedtime. 12/22/18 03/02/19  Emi Holes, PA-C    Family History Family History  Problem Relation Age of Onset   Hypertension Mother    Hypertension Father    Colon cancer Neg Hx    Colon polyps Neg Hx    Esophageal cancer Neg Hx    Rectal cancer Neg Hx    Stomach cancer Neg Hx      Social History Social History   Tobacco Use   Smoking status: Former   Smokeless tobacco: Never  Building services engineer Use: Never used  Substance Use Topics   Alcohol use: Yes    Comment: socially   Drug use: Not Currently     Allergies   Ibuprofen, Other, and Zofran [ondansetron hcl]   Review of Systems Review of Systems  Constitutional:  Positive for activity change. Negative for appetite change, fatigue and fever.  HENT:  Positive for congestion. Negative for sinus pressure, sneezing and sore throat.   Eyes:  Negative for photophobia and visual disturbance (Resolved).  Respiratory:  Positive for cough. Negative for shortness of breath.   Cardiovascular:  Negative for chest pain.  Gastrointestinal:  Negative for abdominal pain, diarrhea, nausea and vomiting.  Endocrine: Positive for polydipsia and polyuria. Negative for polyphagia.  Neurological:  Negative for dizziness, light-headedness and headaches.    Physical Exam Triage Vital Signs ED Triage Vitals  Enc Vitals Group     BP 02/10/22 1008 130/82     Pulse Rate 02/10/22 1008 82     Resp 02/10/22 1008 16     Temp 02/10/22 1008 98.2 F (36.8 C)     Temp Source 02/10/22 1008 Oral     SpO2 02/10/22 1008 97 %     Weight --      Height --      Head Circumference --      Peak Flow --      Pain Score 02/10/22 1013 4     Pain Loc --      Pain Edu? --      Excl. in GC? --    No data found.  Updated Vital Signs BP 130/82 (BP Location: Right Arm)   Pulse 82   Temp 98.2 F (36.8 C) (Oral)   Resp 16   SpO2 97%   Visual Acuity Right Eye Distance: 20/70 Left Eye Distance: 20/50 Bilateral Distance: 20/50  Right Eye Near:   Left Eye Near:    Bilateral Near:     Physical Exam Vitals reviewed.  Constitutional:      General: He is awake.     Appearance: Normal appearance. He is well-developed. He is not ill-appearing.     Comments: Very pleasant male appears stated age in no acute distress sitting  comfortably in exam room  HENT:     Head: Normocephalic and atraumatic.     Right Ear: Tympanic membrane, ear canal and external ear normal. Tympanic membrane is not erythematous or bulging.     Left Ear: Tympanic membrane, ear canal and external ear normal. Tympanic membrane is not erythematous or bulging.  Nose: Nose normal.     Mouth/Throat:     Pharynx: Uvula midline. No oropharyngeal exudate, posterior oropharyngeal erythema or uvula swelling.  Cardiovascular:     Rate and Rhythm: Normal rate and regular rhythm.     Heart sounds: Normal heart sounds, S1 normal and S2 normal. No murmur heard. Pulmonary:     Effort: Pulmonary effort is normal. No accessory muscle usage or respiratory distress.     Breath sounds: Normal breath sounds. No stridor. No wheezing, rhonchi or rales.     Comments: Clear to auscultation bilaterally Abdominal:     General: Bowel sounds are normal.     Palpations: Abdomen is soft.     Tenderness: There is no abdominal tenderness.     Comments: Benign abdominal exam  Neurological:     Mental Status: He is alert.  Psychiatric:        Behavior: Behavior is cooperative.     UC Treatments / Results  Labs (all labs ordered are listed, but only abnormal results are displayed) Labs Reviewed  CBG MONITORING, ED - Abnormal; Notable for the following components:      Result Value   Glucose-Capillary 101 (*)    All other components within normal limits  HEMOGLOBIN A1C  POCT URINALYSIS DIPSTICK, ED / UC    EKG   Radiology No results found.  Procedures Procedures (including critical care time)  Medications Ordered in UC Medications - No data to display  Initial Impression / Assessment and Plan / UC Course  I have reviewed the triage vital signs and the nursing notes.  Pertinent labs & imaging results that were available during my care of the patient were reviewed by me and considered in my medical decision making (see chart for details).      Vital signs and physical exam reassuring today; no indication for emergent evaluation or imaging.  Discussed that cough/congestion likely related to allergies versus URI.  No indication for viral testing as patient has been symptomatic for 4 days and this would not change management by the time results are received.  Recommended that he use over-the-counter medication for symptom management.  Refill of Promethazine DM was sent to the pharmacy per his request.  Discussed that this is sedating and he should not drive or drink alcohol with taking it.  If symptoms are not improving within a week he is to return for reevaluation.  If anything worsens and develops chest pain, shortness of breath, nausea, vomiting he needs to be seen immediately to which he expressed understanding.  Blood sugar was elevated at the ER yesterday.  Discussed that this could be the cause of his temporary blurred vision.  Vision is at baseline today.  UA was normal with no glucosuria or ketones.  Random glucose was 101.  Discussed that he would need to follow-up with PCP; he does not currently have a PCP so we will try to establish him with someone via PCP assistance.  A1c was obtained today and discussed potential need to start medication if this is elevated.  Patient had normal kidney function on BMP 02/09/2022 so we will consider starting metformin based on A1c results.  He is to avoid carbohydrates and drink plenty of fluid.  Discussed that if he has recurrent blurred vision or any additional symptoms he is to return to which he expressed understanding.  Final Clinical Impressions(s) / UC Diagnoses   Final diagnoses:  Upper respiratory tract infection, unspecified type  Acute cough  History of prediabetes  Abnormal blood sugar     Discharge Instructions      Your blood sugar was better today.  We are drawing an A1c to determine if you have developed diabetes given your history of prediabetes.  We will contact you if we  need to start any medication.  It is important that you establish and follow-up with a primary care provider.  If you develop any recurrent blurred vision or other symptoms you need to be seen immediately.  I believe that you have a viral illness.  I have called in a refill of Promethazine DM.  This will make you sleepy so do not drive or drink alcohol taking it.  Can use over-the-counter medications for additional symptom relief.  Make sure you rest and drink plenty of fluid.  If your symptoms are not improving by next week please return for reevaluation.  If you develop any worsening symptoms go to the emergency room as we discussed.     ED Prescriptions     Medication Sig Dispense Auth. Provider   promethazine-dextromethorphan (PROMETHAZINE-DM) 6.25-15 MG/5ML syrup Take 5 mLs by mouth 3 (three) times daily as needed for cough. 118 mL Shanetta Nicolls K, PA-C      PDMP not reviewed this encounter.   Jeani Hawking, PA-C 02/10/22 1111

## 2022-02-10 NOTE — Discharge Instructions (Addendum)
Your blood sugar was better today.  We are drawing an A1c to determine if you have developed diabetes given your history of prediabetes.  We will contact you if we need to start any medication.  It is important that you establish and follow-up with a primary care provider.  If you develop any recurrent blurred vision or other symptoms you need to be seen immediately.  I believe that you have a viral illness.  I have called in a refill of Promethazine DM.  This will make you sleepy so do not drive or drink alcohol taking it.  Can use over-the-counter medications for additional symptom relief.  Make sure you rest and drink plenty of fluid.  If your symptoms are not improving by next week please return for reevaluation.  If you develop any worsening symptoms go to the emergency room as we discussed.

## 2022-02-17 ENCOUNTER — Ambulatory Visit: Payer: BC Managed Care – PPO | Admitting: Family

## 2022-05-03 ENCOUNTER — Ambulatory Visit (HOSPITAL_COMMUNITY)
Admission: EM | Admit: 2022-05-03 | Discharge: 2022-05-03 | Disposition: A | Discharge status: Home / Self Care | Payer: BC Managed Care – PPO | Attending: Internal Medicine | Admitting: Internal Medicine

## 2022-05-03 ENCOUNTER — Encounter (HOSPITAL_COMMUNITY): Payer: Self-pay

## 2022-05-03 DIAGNOSIS — J209 Acute bronchitis, unspecified: Secondary | ICD-10-CM | POA: Diagnosis not present

## 2022-05-03 DIAGNOSIS — R0981 Nasal congestion: Secondary | ICD-10-CM | POA: Diagnosis not present

## 2022-05-03 DIAGNOSIS — R051 Acute cough: Secondary | ICD-10-CM

## 2022-05-03 LAB — CBG MONITORING, ED: Glucose-Capillary: 106 mg/dL — ABNORMAL HIGH (ref 70–99)

## 2022-05-03 MED ORDER — GUAIFENESIN ER 1200 MG PO TB12: 1200.0000 mg | ORAL_TABLET | Freq: Two times a day (BID) | ORAL | 0 refills | Status: DC

## 2022-05-03 MED ORDER — ALBUTEROL SULFATE HFA 108 (90 BASE) MCG/ACT IN AERS
INHALATION_SPRAY | RESPIRATORY_TRACT | Status: AC
  Filled 2022-05-03: qty 6.7

## 2022-05-03 MED ORDER — BENZONATATE 100 MG PO CAPS: 100.0000 mg | ORAL_CAPSULE | Freq: Three times a day (TID) | ORAL | 0 refills | Status: DC

## 2022-05-03 MED ORDER — ALBUTEROL SULFATE HFA 108 (90 BASE) MCG/ACT IN AERS
2.0000 | INHALATION_SPRAY | Freq: Once | RESPIRATORY_TRACT | Status: AC
  Administered 2022-05-03: 2 via RESPIRATORY_TRACT

## 2022-05-03 MED ORDER — PREDNISONE 20 MG PO TABS: 20.0000 mg | ORAL_TABLET | Freq: Every day | ORAL | 0 refills | Status: AC

## 2022-05-03 MED ORDER — ACETAMINOPHEN 325 MG PO TABS
ORAL_TABLET | ORAL | Status: AC
  Filled 2022-05-03: qty 3

## 2022-05-03 MED ORDER — ACETAMINOPHEN 325 MG PO TABS
975.0000 mg | ORAL_TABLET | Freq: Once | ORAL | Status: AC
  Administered 2022-05-03: 975 mg via ORAL

## 2022-05-03 MED ORDER — ALBUTEROL SULFATE HFA 108 (90 BASE) MCG/ACT IN AERS: 1.0000 | INHALATION_SPRAY | Freq: Four times a day (QID) | RESPIRATORY_TRACT | 0 refills | Status: DC | PRN

## 2022-05-03 MED ORDER — OMEPRAZOLE 20 MG PO CPDR: 20.0000 mg | DELAYED_RELEASE_CAPSULE | Freq: Every day | ORAL | 1 refills | Status: DC

## 2022-05-03 NOTE — ED Provider Notes (Signed)
[facility]    CSN: [id number] Arrival date & time: 05/03/22  [id number]      History   Chief Complaint Chief Complaint  Patient presents with   Cough    HPI Wesley [name] is a 56 y.o. male.   Patient presents to urgent care for evaluation of cough, headache, and nasal congestion that started approximately 3 days ago.  Patient states that he is also hearing wheeze from his lungs with coughing.  Denies history of asthma or COPD.  He is not a smoker and denies drug use.  Nasal congestion is thick and white/clear.  Headache is generalized and currently a 6 on a scale of 0-10.  Cough is sometimes productive with clear phlegm and mostly dry.  No sore throat, ear pain, neck pain, blurry vision, decreased visual acuity, fever/chills, or body aches reported.  Denies chest pain, shortness of breath, and weakness.  No history of chronic respiratory problems reported.  He has never had COVID in the past.  Denies nausea, vomiting, dizziness, constipation, and urinary symptoms.  States he attempted to go to work this morning but was unable to due to cough and nasal congestion.  Patient works in [employer] and denies inhaling chemicals/dust that may aggravate cough.  Denies history of allergic rhinitis.  No watery itchy eyes or eye drainage.  Patient would like his sugar checked today and states that he is a prediabetic.  He does not have a CBG machine at home and does not take anything for diabetes.  He does not have a primary care provider and is requesting assistance in establishing care with a PCP.  He has not attempted use of any over-the-counter medications prior to arrival urgent care.   Cough   Past Medical History:  Diagnosis Date   Alcohol abuse    Anxiety    Anxiety    Arthritis    Chronic neck pain    Cocaine abuse (HCC)    Dizziness    GERD (gastroesophageal reflux disease)    Hypercholesteremia    Hypertension    Prediabetes     Patient Active Problem List   Diagnosis  Date Noted   Legally blind in right eye, as defined in [place] 05/02/2017   Prediabetes 04/23/2017   Muscle spasm 04/23/2017   Essential hypertension 04/16/2017   H/O medication noncompliance 04/02/2017   Vitamin D deficiency 04/02/2017   DJD (degenerative joint disease) of cervical spine 03/16/2017   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 Authorizing Provider  albuterol (VENTOLIN HFA) 108 (90 Base) MCG/ACT inhaler Inhale 1-2 puffs into the lungs every 6 (six) hours as needed for wheezing or shortness of breath. 05/03/22  Yes Keymon Mcelroy, Donavan Burnetatharine M, FNP  benzonatate (TESSALON) 100 MG capsule Take 1 capsule (100 mg total) by mouth every 8 (eight) hours. 05/03/22  Yes Carlisle BeersStanhope, Leroy Trim M, FNP  Guaifenesin 1200 MG TB12 Take 1 tablet (1,200 mg total) by mouth in the morning and at bedtime. 05/03/22  Yes Carlisle BeersStanhope, Anamarie Hunn M, FNP  omeprazole (PRILOSEC) 20 MG capsule Take 1 capsule (20 mg total) by mouth daily. 05/03/22  Yes Carlisle BeersStanhope, Nyeema Want M, FNP  predniSONE (DELTASONE) 20 MG tablet Take 1 tablet (20 mg total) by mouth daily  for 5 days. 05/03/22 05/08/22 Yes Elner Seifert, Donavan Burnet, FNP  lisinopril (ZESTRIL) 10 MG tablet Take 1 tablet (10 mg total) by mouth daily. Patient taking differently: Take 5 mg by mouth daily. 07/12/19   Elvina Sidle, MD  esomeprazole (NEXIUM) 20 MG capsule Take 20 mg by mouth daily. 02/24/20 10/22/20  [provider]  famotidine (PEPCID) 20 MG tablet Take 1 tablet (20 mg total) by mouth 2 (two) times daily. 03/07/20 10/22/20  Wallis Bamberg, PA-C  sucralfate (CARAFATE) 1 GM/10ML suspension Take 10 mLs (1 g total) by mouth 4 (four) times daily -  with meals and at bedtime. 12/22/18 03/02/19  Emi Holes, PA-C    Family History Family  History  Problem Relation Age of Onset   Hypertension Mother    Hypertension Father    Colon cancer Neg Hx    Colon polyps Neg Hx    Esophageal cancer Neg Hx    Rectal cancer Neg Hx    Stomach cancer Neg Hx     Social History Social History   Tobacco Use   Smoking status: Former   Smokeless tobacco: Never  Building services engineer Use: Never used  Substance Use Topics   Alcohol use: Yes    Comment: socially   Drug use: Not Currently     Allergies   Ibuprofen, Other, and Zofran [ondansetron hcl]   Review of Systems Review of Systems  Respiratory:  Positive for cough.    Per HPI  Physical Exam Triage Vital Signs ED Triage Vitals  Enc Vitals Group     BP 05/03/22 1059 (!) 140/81     Pulse Rate 05/03/22 1059 76     Resp 05/03/22 1059 18     Temp 05/03/22 1059 97.8 F (36.6 C)     Temp Source 05/03/22 1059 Oral     SpO2 05/03/22 1059 96 %     Weight --      Height --      Head Circumference --      Peak Flow --      Pain Score 05/03/22 1101 6     Pain Loc --      Pain Edu? --      Excl. in GC? --    No data found.  Updated Vital Signs BP (!) 140/81 (BP Location: Right Arm)   Pulse 76   Temp 97.8 F (36.6 C) (Oral)   Resp 18   SpO2 96%   Visual Acuity Right Eye Distance:   Left Eye Distance:   Bilateral Distance:    Right Eye Near:   Left Eye Near:    Bilateral Near:     Physical Exam Vitals and nursing note reviewed.  Constitutional:      Appearance: Normal appearance. He is not ill-appearing or toxic-appearing.     Comments: Very pleasant patient sitting on exam in position of comfort table in no acute distress.   HENT:     Head: Normocephalic and atraumatic.     Right Ear: Hearing, tympanic membrane, ear canal and external ear normal.     Left Ear: Hearing, tympanic membrane, ear canal and external ear normal.     Nose: Nose normal.     Mouth/Throat:     Lips: Pink.     Mouth: Mucous membranes are moist.  Eyes:     General: Lids are  normal. Vision grossly intact. Gaze aligned appropriately.     Extraocular Movements: Extraocular movements intact.  Conjunctiva/sclera: Conjunctivae normal.  Cardiovascular:     Rate and Rhythm: Normal rate and regular rhythm.     Heart sounds: Normal heart sounds, S1 normal and S2 normal.  Pulmonary:     Effort: Pulmonary effort is normal. No respiratory distress.     Breath sounds: Normal air entry. Examination of the right-upper field reveals wheezing. Examination of the left-upper field reveals wheezing. Examination of the right-lower field reveals rhonchi. Examination of the left-lower field reveals rhonchi. Wheezing and rhonchi present.     Comments: No acute respiratory distress.  Wheezes heard to the left and right upper lung fields posteriorly and anteriorly.  Rhonchi heard to auscultation to the lower bilateral lung fields.  Dry cough elicited with deep inspiration during lung exam. Abdominal:     Palpations: Abdomen is soft.  Musculoskeletal:     Cervical back: Neck supple.  Skin:    General: Skin is warm and dry.     Capillary Refill: Capillary refill takes less than 2 seconds.     Findings: No rash.  Neurological:     General: No focal deficit present.     Mental Status: He is alert and oriented to person, place, and time. Mental status is at baseline.     Cranial Nerves: No dysarthria or facial asymmetry.     Gait: Gait is intact.  Psychiatric:        Mood and Affect: Mood normal.        Speech: Speech normal.        Behavior: Behavior normal.        Thought Content: Thought content normal.        Judgment: Judgment normal.      UC Treatments / Results  Labs (all labs ordered are listed, but only abnormal results are displayed) Labs Reviewed  SARS CORONAVIRUS 2 (TAT 6-24 HRS)  CBG MONITORING, ED    EKG   Radiology No results found.  Procedures Procedures (including critical care time)  Medications Ordered in UC Medications  albuterol (VENTOLIN  HFA) 108 (90 Base) MCG/ACT inhaler 2 puff (has no administration in time range)  acetaminophen (TYLENOL) tablet 975 mg (has no administration in time range)    Initial Impression / Assessment and Plan / UC Course  I have reviewed the triage vital signs and the nursing notes.  Pertinent labs & imaging results that were available during my care of the patient were reviewed by me and considered in my medical decision making (see chart for details).   1.  Acute bronchitis and cough COVID-19 testing is pending although etiology is likely related to acute bronchitis.  If COVID testing is positive, patient is a candidate for Paxlovid prescription due to comorbidities.   Patient is well-appearing in clinic with hemodynamically stable vital signs and stable cardiopulmonary exam.  No acute respiratory distress.  We will manage this with oral steroids, albuterol inhaler, and prescriptions for symptomatic relief.  Patient given albuterol 2 puffs in clinic for wheezing heard to auscultation and related to acute bronchitis and cough.  He may use this every 4-6 hours as needed at home and has been advised to rinse out his mouth after use to prevent oral candidiasis.  Tylenol 975 mg given in clinic for headache.  He may continue this at home every 6 hours as needed.  Advised patient to start taking prednisone 20 mg once daily to calm down inflammation in lungs contributing to cough and wheeze.  He is to use omeprazole while taking this  medicine to avoid stomach upset and inflammation related to steroid use and take this with food.  Guaifenesin 1200 mg every 12 hours to be used to thin nasal mucus.  Tessalon Perles every 8 hours may be used as needed for cough.  Patient agreeable with plan.  Work note given.  PCP assistance initiated today.  Discussed importance of primary care with patient who verbalizes understanding.   Discussed physical exam and available lab work findings in clinic with patient.  Counseled patient  regarding appropriate use of medications and potential side effects for all medications recommended or prescribed today. Discussed red flag signs and symptoms of worsening condition,when to call the PCP office, return to urgent care, and when to seek higher level of care in the emergency department. Patient verbalizes understanding and agreement with plan. All questions answered. Patient discharged in stable condition.  Final Clinical Impressions(s) / UC Diagnoses   Final diagnoses:  Acute bronchitis, unspecified organism  Acute cough  Nasal congestion     Discharge Instructions      You have acute bronchitis which is inflammation of the upper airways.  This is causing your cough and wheezing. I have tested you for COVID-19. We will call you if your results are positive in the next 12-24 hours.   We gave you tylenol in the clinic for headache. Your next dose at home may be at 6pm if needed tonight.  We gave you an albuterol inhaler in the clinic 2 puffs.  You may use 1 to 2 puffs of this every 4-6 hours at home as needed for cough, shortness of breath, and wheezing.   Take prednisone 20 mg once daily with breakfast for the next 5 days.  Take this medicine with food to avoid stomach upset.  Start taking omeprazole (Prilosec) to protect your stomach while taking steroid medication.  Take this once daily in the morning.  Avoid spicy, fatty, and acidic foods.  You may use Tessalon Perles cough medicine every 8 hours as needed for cough.   You may use guaifenesin every 12 hours to thin your nasal mucus so that you are able to cough it up and blow it out of your nose easier.  Drink plenty of water while taking this medicine.  If you develop any new or worsening symptoms or do not improve in the next 2 to 3 days, please return.  If your symptoms are severe, please go to the emergency room.  Follow-up with your primary care provider for further evaluation and management of your symptoms as well  as ongoing wellness visits.  I hope you feel better!     ED Prescriptions     Medication Sig Dispense Auth. Provider   Guaifenesin 1200 MG TB12 Take 1 tablet (1,200 mg total) by mouth in the morning and at bedtime. 14 tablet Reita May M, FNP   benzonatate (TESSALON) 100 MG capsule Take 1 capsule (100 mg total) by mouth every 8 (eight) hours. 21 capsule Reita May M, FNP   albuterol (VENTOLIN HFA) 108 (90 Base) MCG/ACT inhaler Inhale 1-2 puffs into the lungs every 6 (six) hours as needed for wheezing or shortness of breath. 8 g Reita May M, FNP   omeprazole (PRILOSEC) 20 MG capsule Take 1 capsule (20 mg total) by mouth daily. 30 capsule Reita May M, FNP   predniSONE (DELTASONE) 20 MG tablet Take 1 tablet (20 mg total) by mouth daily for 5 days. 5 tablet Carlisle Beers, FNP  PDMP not reviewed this encounter.   Carlisle Beers, [place] 05/03/22 1152

## 2022-05-03 NOTE — ED Triage Notes (Signed)
Pt c/o cough and runny nose x3 days. Taking cough medicine with no relief.

## 2022-05-03 NOTE — Discharge Instructions (Addendum)
You have acute bronchitis which is inflammation of the upper airways.  This is causing your cough and wheezing. I have tested you for COVID-19. We will call you if your results are positive in the next 12-24 hours.   We gave you tylenol in the clinic for headache. Your next dose at home may be at 6pm if needed tonight.  We gave you an albuterol inhaler in the clinic 2 puffs.  You may use 1 to 2 puffs of this every 4-6 hours at home as needed for cough, shortness of breath, and wheezing.   Take prednisone 20 mg once daily with breakfast for the next 5 days.  Take this medicine with food to avoid stomach upset.  Start taking omeprazole (Prilosec) to protect your stomach while taking steroid medication.  Take this once daily in the morning.  Avoid spicy, fatty, and acidic foods.  You may use Tessalon Perles cough medicine every 8 hours as needed for cough.   You may use guaifenesin every 12 hours to thin your nasal mucus so that you are able to cough it up and blow it out of your nose easier.  Drink plenty of water while taking this medicine.  If you develop any new or worsening symptoms or do not improve in the next 2 to 3 days, please return.  If your symptoms are severe, please go to the emergency room.  Follow-up with your primary care provider for further evaluation and management of your symptoms as well as ongoing wellness visits.  I hope you feel better!

## 2022-05-29 ENCOUNTER — Ambulatory Visit: Payer: BC Managed Care – PPO | Admitting: Internal Medicine

## 2022-06-26 ENCOUNTER — Ambulatory Visit (HOSPITAL_COMMUNITY)
Admission: EM | Admit: 2022-06-26 | Discharge: 2022-06-26 | Discharge status: Against Medical Advice | Payer: BC Managed Care – PPO | Attending: Family Medicine | Admitting: Family Medicine

## 2022-06-26 ENCOUNTER — Encounter (HOSPITAL_COMMUNITY): Payer: Self-pay | Admitting: Emergency Medicine

## 2022-06-26 ENCOUNTER — Ambulatory Visit (HOSPITAL_COMMUNITY)
Admission: EM | Admit: 2022-06-26 | Discharge: 2022-06-26 | Disposition: A | Discharge status: Home / Self Care | Payer: BC Managed Care – PPO | Attending: Emergency Medicine | Admitting: Emergency Medicine

## 2022-06-26 DIAGNOSIS — K219 Gastro-esophageal reflux disease without esophagitis: Secondary | ICD-10-CM | POA: Diagnosis not present

## 2022-06-26 DIAGNOSIS — Z8679 Personal history of other diseases of the circulatory system: Secondary | ICD-10-CM | POA: Diagnosis not present

## 2022-06-26 LAB — CBG MONITORING, ED: Glucose-Capillary: 141 mg/dL — ABNORMAL HIGH (ref 70–99)

## 2022-06-26 MED ORDER — PANTOPRAZOLE SODIUM 40 MG PO TBEC: 40.0000 mg | DELAYED_RELEASE_TABLET | Freq: Every day | ORAL | 2 refills | Status: DC

## 2022-06-26 NOTE — ED Notes (Signed)
Called pt 4 times and no answer.  

## 2022-06-26 NOTE — ED Triage Notes (Signed)
Pt reports Bp this morning was 165/120, reports out of HTn medications. Reports blurry vision for couple weeks.  Pt also wanting to be checked for high blood sugar due to blurry vison. Doesn't have a PCP

## 2022-06-26 NOTE — ED Provider Notes (Signed)
[facility]    CSN: [id number] Arrival date & time: 06/26/22  1715      History   Chief Complaint Chief Complaint  Patient presents with   Medication Refill   Hypertension    HPI Wesley [name] is a 56 y.o. male.  Presents for BP check and medication refill Used to be on lisinopril Reports blurry vision intermittently. Of note he is legally blind in the right eye  Patient brought his old prescription bottle - the label is faded and yellowed and very likely expired. He took one pill today. Has not taken in many years.  Reports almost every day having reflux symptoms. Epigastric discomfort after eating oily foods, worse with laying flat. Was prescribed reflux medicine a few months ago but never took. Denies nausea, vomiting, diarrhea. No chest pain.  He would like his blood sugar checked today. He does not have history of diabetes. Discussed with patient CBG not necessary especially given his last few readings have been in the 100s. Patient would still like to check.  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 Authorizing Provider  pantoprazole  (PROTONIX) 40 MG tablet Take 1 tablet (40 mg total) by mouth daily. 06/26/22  Yes Ulice Follett, Lurena Joiner, PA-C  albuterol (VENTOLIN HFA) 108 (90 Base) MCG/ACT inhaler Inhale 1-2 puffs into the lungs every 6 (six) hours as needed for wheezing or shortness of breath. 05/03/22   Carlisle Beers, FNP  lisinopril (ZESTRIL) 10 MG tablet Take 1 tablet (10 mg total) by mouth daily. Patient taking differently: Take 5 mg by mouth daily. 07/12/19   Elvina Sidle, MD  esomeprazole (NEXIUM) 20 MG capsule Take 20 mg by mouth daily. 02/24/20 10/22/20  [provider]  famotidine (PEPCID) 20 MG tablet Take 1 tablet (20 mg total) by mouth 2 (two) times daily. 03/07/20 10/22/20  Wallis Bamberg, PA-C  sucralfate (CARAFATE) 1 GM/10ML suspension Take 10 mLs (1 g total) by mouth 4 (four) times daily -  with meals and at bedtime. 12/22/18 03/02/19  Emi Holes, PA-C    Family History Family History  Problem Relation Age of Onset   Hypertension Mother    Hypertension Father    Colon cancer Neg Hx    Colon polyps Neg Hx    Esophageal cancer Neg Hx    Rectal cancer Neg Hx    Stomach cancer Neg Hx     Social History Social History   Tobacco Use   Smoking status: Former   Smokeless tobacco: Never  Advertising account planner  Vaping Use: Never used  Substance Use Topics   Alcohol use: Yes    Comment: socially   Drug use: Not Currently     Allergies   Ibuprofen, Other, and Zofran [ondansetron hcl]   Review of Systems Review of Systems Per HPI  Physical Exam Triage Vital Signs ED Triage Vitals [06/26/22 1826]  Enc Vitals Group     BP (!) 129/90     Pulse Rate 98     Resp 16     Temp 98.3 F (36.8 C)     Temp src      SpO2 94 %     Weight      Height      Head Circumference      Peak Flow      Pain Score      Pain Loc      Pain Edu?      Excl. in [place]?    No data found.  Updated Vital Signs BP (!) 129/90 (BP Location: Left Arm)   Pulse 98   Temp 98.3 F (36.8 C)   Resp 16   SpO2 94%    Physical Exam Vitals and nursing note reviewed.  Constitutional:      General: He is not in acute distress.    Appearance: Normal appearance. He is not ill-appearing.  HENT:     Mouth/Throat:     Mouth: Mucous membranes are moist.     Pharynx: Oropharynx is clear.  Eyes:     Conjunctiva/sclera: Conjunctivae normal.  Cardiovascular:     Rate and Rhythm: Normal rate and regular rhythm.     Heart sounds: Normal heart sounds.  Pulmonary:     Effort: Pulmonary effort is normal. No respiratory distress.     Breath sounds: Normal breath sounds.  Abdominal:     Palpations: Abdomen is soft.     Tenderness: There is no abdominal tenderness. There is no guarding or rebound.  Musculoskeletal:        General: Normal range of motion.  Skin:    General: Skin is warm and dry.  Neurological:     General: No focal deficit present.     Mental Status: He is alert and oriented to person, place, and time.     Sensory: No sensory deficit.     Motor: No weakness.     Gait: Gait normal.     UC Treatments / Results  Labs (all labs ordered are listed, but only abnormal results are displayed) Labs Reviewed  CBG MONITORING, ED - Abnormal; Notable for the following components:      Result Value   Glucose-Capillary 141 (*)    All other components within normal limits    EKG  Radiology No results found.  Procedures Procedures   Medications Ordered in UC Medications - No data to display  Initial Impression / Assessment and Plan / UC Course  I have reviewed the triage vital signs and the nursing notes.  Pertinent labs & imaging results that were available during my care of the patient were reviewed by me and considered in my medical decision making (see chart for details).  CBG per patient request was 141. BP is very normal today. I do not think patient needs to be put on medication at this time given the appropriate blood pressure reading with many years off the medication. Discussed  with patient we don't want to tank his BP. Will leave that up to primary care for reassessment. Set up with  PCP via open scheduling for 3 days from now Additionally starting on 20 mg daily Protonix for reflux symptoms. Discussed diet changes. Return precautions discussed. Patient agrees to plan  Final Clinical Impressions(s) / UC Diagnoses   Final diagnoses:  Gastroesophageal reflux disease without esophagitis  History of hypertension     Discharge Instructions      Please take the Protonix medication as prescribed. This can help with your reflux symptoms.  I recommend follow up with your new primary care provider this Thursday. They can further evaluate you and perform blood work.    ED Prescriptions     Medication Sig Dispense Auth. Provider   pantoprazole (PROTONIX) 40 MG tablet Take 1 tablet (40 mg total) by mouth daily. 30 tablet Anant Agard, Lurena Joiner, PA-C      PDMP not reviewed this encounter.   Marlow Baars, [place] 06/26/22 1953

## 2022-06-26 NOTE — Discharge Instructions (Addendum)
Please take the Protonix medication as prescribed. This can help with your reflux symptoms.  I recommend follow up with your new primary care provider this Thursday. They can further evaluate you and perform blood work.

## 2022-06-26 NOTE — ED Notes (Signed)
No answer x3 from waiting area. Called pt states he is in his car and will come in. Pt never showed.

## 2022-06-29 ENCOUNTER — Ambulatory Visit: Payer: BC Managed Care – PPO | Admitting: Nurse Practitioner

## 2022-07-11 ENCOUNTER — Ambulatory Visit (HOSPITAL_COMMUNITY)
Admission: EM | Admit: 2022-07-11 | Discharge: 2022-07-11 | Disposition: A | Discharge status: Home / Self Care | Payer: BC Managed Care – PPO | Attending: Family Medicine | Admitting: Family Medicine

## 2022-07-11 ENCOUNTER — Encounter (HOSPITAL_COMMUNITY): Payer: Self-pay | Admitting: Emergency Medicine

## 2022-07-11 DIAGNOSIS — R739 Hyperglycemia, unspecified: Secondary | ICD-10-CM | POA: Diagnosis not present

## 2022-07-11 DIAGNOSIS — R21 Rash and other nonspecific skin eruption: Secondary | ICD-10-CM | POA: Diagnosis not present

## 2022-07-11 DIAGNOSIS — H6123 Impacted cerumen, bilateral: Secondary | ICD-10-CM

## 2022-07-11 DIAGNOSIS — I10 Essential (primary) hypertension: Secondary | ICD-10-CM

## 2022-07-11 LAB — CBG MONITORING, ED: Glucose-Capillary: 118 mg/dL — ABNORMAL HIGH (ref 70–99)

## 2022-07-11 MED ORDER — CLOTRIMAZOLE-BETAMETHASONE 1-0.05 % EX CREA: TOPICAL_CREAM | CUTANEOUS | 0 refills | Status: DC

## 2022-07-11 MED ORDER — LISINOPRIL 10 MG PO TABS: 10.0000 mg | ORAL_TABLET | Freq: Every day | ORAL | 0 refills | Status: DC

## 2022-07-11 NOTE — ED Provider Notes (Signed)
[facility]    CSN: [id number] Arrival date & time: 07/11/22  1203      History   Chief Complaint Chief Complaint  Patient presents with   Ear Problem   Hypertension    HPI Wesley [name] is a 56 y.o. male.   Patient is here for left ear issues.  Started about 2-3 days ago.  Feels like there is something in there.  Slight decreased hearing.   He is also out of his lisinopril x 2 days.  Needs this refilled.   He does not have a pcp.   He is a pre-diabetic and would like his blood sugar checked.   Also has a rash at his lower abdomen.  He was given a cream in the past.  Itches mostly at night.  Going on x 6-7 months.   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 Authorizing Provider  albuterol (VENTOLIN HFA) 108 (90 Base) MCG/ACT inhaler Inhale 1-2 puffs into the lungs every 6 (six) hours as needed for wheezing or shortness of breath. 05/03/22   Carlisle Beers, FNP  lisinopril (ZESTRIL) 10 MG tablet Take 1 tablet (10 mg total) by mouth daily. Patient taking differently: Take 5 mg by mouth daily. 07/12/19   Elvina Sidle, MD  pantoprazole  (PROTONIX) 40 MG tablet Take 1 tablet (40 mg total) by mouth daily. 06/26/22   Rising, Lurena Joiner, PA-C  esomeprazole (NEXIUM) 20 MG capsule Take 20 mg by mouth daily. 02/24/20 10/22/20  [provider]  famotidine (PEPCID) 20 MG tablet Take 1 tablet (20 mg total) by mouth 2 (two) times daily. 03/07/20 10/22/20  Wallis Bamberg, PA-C  sucralfate (CARAFATE) 1 GM/10ML suspension Take 10 mLs (1 g total) by mouth 4 (four) times daily -  with meals and at bedtime. 12/22/18 03/02/19  Emi Holes, PA-C    Family History Family History  Problem Relation Age of Onset   Hypertension Mother    Hypertension Father    Colon cancer Neg Hx    Colon polyps Neg Hx    Esophageal cancer Neg Hx    Rectal cancer Neg Hx    Stomach cancer Neg Hx     Social History Social History   Tobacco Use   Smoking status: Former   Smokeless tobacco: Never  Building services engineer Use: Never used  Substance Use Topics   Alcohol use: Yes    Comment: socially   Drug use: Not Currently     Allergies   Ibuprofen, Other,  and Zofran [ondansetron hcl]   Review of Systems Review of Systems  Constitutional: Negative.   HENT:  Positive for hearing loss.   Respiratory: Negative.    Cardiovascular: Negative.   Gastrointestinal: Negative.   Genitourinary: Negative.   Musculoskeletal: Negative.   Skin:  Positive for rash.     Physical Exam Triage Vital Signs ED Triage Vitals  Enc Vitals Group     BP 07/11/22 1304 (!) 146/104     Pulse Rate 07/11/22 1304 84     Resp 07/11/22 1304 18     Temp 07/11/22 1304 97.6 F (36.4 C)     Temp Source 07/11/22 1304 Oral     SpO2 07/11/22 1304 97 %     Weight --      Height --      Head Circumference --      Peak Flow --      Pain Score 07/11/22 1305 3     Pain Loc --      Pain Edu? --      Excl. in [place]? --    No data found.  Updated Vital Signs BP (!) 146/104 (BP Location: Right Arm)   Pulse 84   Temp 97.6 F (36.4 C) (Oral)   Resp 18   SpO2 97%   Visual  Acuity Right Eye Distance:   Left Eye Distance:   Bilateral Distance:    Right Eye Near:   Left Eye Near:    Bilateral Near:     Physical Exam Constitutional:      Appearance: Normal appearance.  HENT:     Right Ear: There is impacted cerumen.     Left Ear: There is impacted cerumen.  Cardiovascular:     Rate and Rhythm: Normal rate and regular rhythm.  Pulmonary:     Effort: Pulmonary effort is normal.     Breath sounds: Normal breath sounds.  Musculoskeletal:        General: Normal range of motion.     Cervical back: Normal range of motion.  Skin:    Comments: At the lower abdomen is a large circular area of rash;  dry, scaly  Neurological:     General: No focal deficit present.     Mental Status: He is alert.  Psychiatric:        Mood and Affect: Mood normal.      UC Treatments / Results  Labs (all labs ordered are listed, but only abnormal results are displayed) Labs Reviewed - No data to display  EKG   Radiology No results found.  Procedures The ears were irrigated bilaterally;  patient tolerated this well.  However, wax/cerumen still remains.   Medications Ordered in UC Medications - No data to display  Initial Impression / Assessment and Plan / UC Course  I have reviewed the triage vital signs and the nursing notes.  Pertinent labs & imaging results that were available during my care of the patient were reviewed by me and considered in my medical decision making (see chart for details).   Final Clinical Impressions(s) / UC Diagnoses   Final diagnoses:  Hypertension, unspecified type  Bilateral impacted cerumen  Elevated blood sugar  Rash and nonspecific skin eruption     Discharge Instructions      You were seen today for various issues.  Your lisinopril was refilled to the pharmacy.  I have sent a topical cream to the pharmacy for your rash.  Your blood sugar was slightly elevated at 118.  Your ears still have wax in them after  irrigation.  I recommend you get over the counter ear wax softener like cerumenex.  You may use this for 2 weeks to soften wax, and then return for removal at that time.  You should go to [email or web address] to find a primary care provider for all your long term healthcare needs.  The urgent care is not meant to follow up on these long term issues.     ED Prescriptions     Medication Sig Dispense Auth. Provider   lisinopril (ZESTRIL) 10 MG tablet Take 1 tablet (10 mg total) by mouth daily. 30 tablet Lynsey Ange, MD   clotrimazole-betamethasone (LOTRISONE) cream Apply to affected area 2 times daily prn 15 g Rondel Oh, MD      PDMP not reviewed this encounter.   Rondel Oh, MD 07/11/22 1350

## 2022-07-11 NOTE — Discharge Instructions (Addendum)
You were seen today for various issues.  Your lisinopril was refilled to the pharmacy.  I have sent a topical cream to the pharmacy for your rash.  Your blood sugar was slightly elevated at 118.  Your ears still have wax in them after irrigation.  I recommend you get over the counter ear wax softener like cerumenex.  You may use this for 2 weeks to soften wax, and then return for removal at that time.  You should go to [email or web address] to find a primary care provider for all your long term healthcare needs.  The urgent care is not meant to follow up on these long term issues.

## 2022-07-11 NOTE — ED Triage Notes (Signed)
Pt reports mild left ear pain. Feels like something is in his ear.  Also endorses high blood pressure. States he has been out of his Lisinopril for 2 days.

## 2022-07-14 ENCOUNTER — Ambulatory Visit (HOSPITAL_COMMUNITY)
Admission: EM | Admit: 2022-07-14 | Discharge: 2022-07-14 | Disposition: A | Discharge status: Home / Self Care | Payer: BC Managed Care – PPO | Attending: Physician Assistant | Admitting: Physician Assistant

## 2022-07-14 ENCOUNTER — Encounter (HOSPITAL_COMMUNITY): Payer: Self-pay

## 2022-07-14 DIAGNOSIS — H6123 Impacted cerumen, bilateral: Secondary | ICD-10-CM

## 2022-07-14 MED ORDER — CARBAMIDE PEROXIDE 6.5 % OT SOLN: 5.0000 [drp] | Freq: Two times a day (BID) | OTIC | 0 refills | Status: AC

## 2022-07-14 NOTE — ED Provider Notes (Signed)
[place]    CSN: [id number] Arrival date & time: 07/14/22  1703      History   Chief Complaint Chief Complaint  Patient presents with   Ear Pain    HPI Wesley [name] is a 56 y.o. male.   Patient here today for evaluation of left ear discomfort. He reports he feels as if there is something moving in his left ear. He denies any issues with right ear. He has had some hearing loss to left ear. He denies any fever, congestion or cough. He does not report treatment for symptoms.   The history is provided by the patient.    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 Authorizing Provider  carbamide peroxide (DEBROX) 6.5 % OTIC solution Place 5 drops into both ears 2 (two) times daily for 3 days. 07/14/22 07/17/22 Yes Francene Finders, PA-C  albuterol (VENTOLIN HFA) 108 (90 Base) MCG/ACT inhaler Inhale 1-2 puffs into the lungs every 6 (six) hours as needed for wheezing or shortness of breath. 05/03/22   Talbot Grumbling, FNP  clotrimazole-betamethasone (LOTRISONE) cream Apply to affected area 2 times daily prn 07/11/22   Rondel Oh, MD  lisinopril  (ZESTRIL) 10 MG tablet Take 1 tablet (10 mg total) by mouth daily. 07/11/22   Piontek, Junie Panning, MD  pantoprazole (PROTONIX) 40 MG tablet Take 1 tablet (40 mg total) by mouth daily. 06/26/22   Rising, Wells Guiles, PA-C  esomeprazole (NEXIUM) 20 MG capsule Take 20 mg by mouth daily. 02/24/20 10/22/20  [provider]  famotidine (PEPCID) 20 MG tablet Take 1 tablet (20 mg total) by mouth 2 (two) times daily. 03/07/20 10/22/20  Jaynee Eagles, PA-C  sucralfate (CARAFATE) 1 GM/10ML suspension Take 10 mLs (1 g total) by mouth 4 (four) times daily -  with meals and at bedtime. 12/22/18 03/02/19  Frederica Kuster, PA-C    Family History Family History  Problem Relation Age of Onset   Hypertension Mother    Hypertension Father    Colon cancer Neg Hx    Colon polyps Neg Hx    Esophageal cancer Neg Hx    Rectal cancer Neg Hx    Stomach cancer Neg Hx     Social History Social History   Tobacco Use   Smoking status: Former   Smokeless tobacco: Never  Scientific laboratory technician Use: Never used  Substance Use Topics   Alcohol use: Yes    Comment: socially   Drug use: Not Currently  Allergies   Ibuprofen, Other, and Zofran [ondansetron hcl]   Review of Systems Review of Systems  Constitutional:  Negative for chills and fever.  HENT:  Positive for ear pain. Negative for congestion and sore throat.   Eyes:  Negative for discharge and redness.  Respiratory:  Negative for cough and shortness of breath.   Gastrointestinal:  Negative for nausea and vomiting.  Neurological:  Negative for numbness.     Physical Exam Triage Vital Signs ED Triage Vitals  Enc Vitals Group     BP      Pulse      Resp      Temp      Temp src      SpO2      Weight      Height      Head Circumference      Peak Flow      Pain Score      Pain Loc      Pain Edu?      Excl. in [place]?    No data found.  Updated Vital Signs BP (!) 152/92 (BP Location: Left Arm)   Pulse 96   Temp 98.6 F (37 C) (Oral)   Resp  18   SpO2 98%      Physical Exam Vitals and nursing note reviewed.  Constitutional:      General: He is not in acute distress.    Appearance: Normal appearance. He is not ill-appearing.  HENT:     Head: Normocephalic and atraumatic.     Ears:     Comments: Initially unable to visualize bilateral TMs    Nose: Nose normal. No congestion or rhinorrhea.  Eyes:     Conjunctiva/sclera: Conjunctivae normal.  Cardiovascular:     Rate and Rhythm: Normal rate.  Pulmonary:     Effort: Pulmonary effort is normal. No respiratory distress.  Neurological:     Mental Status: He is alert.  Psychiatric:        Mood and Affect: Mood normal.        Behavior: Behavior normal.        Thought Content: Thought content normal.      UC Treatments / Results  Labs (all labs ordered are listed, but only abnormal results are displayed) Labs Reviewed - No data to display  EKG   Radiology No results found.  Procedures Procedures (including critical care time)  Medications Ordered in UC Medications - No data to display  Initial Impression / Assessment and Plan / UC Course  I have reviewed the triage vital signs and the nursing notes.  Pertinent labs & imaging results that were available during my care of the patient were reviewed by me and considered in my medical decision making (see chart for details).    Bilateral ears irrigated in office but unable to remove cerumen in office. Debrox drops prescribed in hopes to soften wax. Encouraged follow up with any further concerns or if he would like to have ears irrigated after a few days for cerumen removal. Patient expresses understanding.   Final Clinical Impressions(s) / UC Diagnoses   Final diagnoses:  Bilateral impacted cerumen   Discharge Instructions   None    ED Prescriptions     Medication Sig Dispense Auth. Provider   carbamide peroxide (DEBROX) 6.5 % OTIC solution Place 5 drops into both ears 2 (two) times daily for 3 days. 15  mL Francene Finders, PA-C      PDMP not reviewed  this encounter.   Francene Finders, PA-C 07/14/22 1904

## 2022-07-14 NOTE — ED Triage Notes (Signed)
Pt reports left ear pain. Feels full. Pt was seen on 07/11/2022

## 2022-08-04 ENCOUNTER — Other Ambulatory Visit: Payer: Self-pay

## 2022-08-04 ENCOUNTER — Encounter (HOSPITAL_COMMUNITY): Payer: Self-pay

## 2022-08-04 ENCOUNTER — Emergency Department (HOSPITAL_COMMUNITY)
Admission: EM | Admit: 2022-08-04 | Discharge: 2022-08-04 | Disposition: A | Discharge status: Home / Self Care | Payer: BC Managed Care – PPO | Attending: Emergency Medicine | Admitting: Emergency Medicine

## 2022-08-04 DIAGNOSIS — M25532 Pain in left wrist: Secondary | ICD-10-CM | POA: Diagnosis present

## 2022-08-04 DIAGNOSIS — Z79899 Other long term (current) drug therapy: Secondary | ICD-10-CM | POA: Insufficient documentation

## 2022-08-04 DIAGNOSIS — I10 Essential (primary) hypertension: Secondary | ICD-10-CM | POA: Diagnosis not present

## 2022-08-04 DIAGNOSIS — M79602 Pain in left arm: Secondary | ICD-10-CM

## 2022-08-04 MED ORDER — ACETAMINOPHEN 325 MG PO TABS
650.0000 mg | ORAL_TABLET | Freq: Once | ORAL | Status: DC
  Filled 2022-08-04: qty 2

## 2022-08-04 NOTE — ED Triage Notes (Signed)
Pt arrived POV stating he was sleeping and woke up with HTN and left arm pain.

## 2022-08-04 NOTE — Discharge Instructions (Signed)
You may take acetaminophen (Tylenol) as needed for pain.  Please continue to take your blood pressure once a day and keep a record of it.  Take that record with you when you see your primary care provider.

## 2022-08-04 NOTE — ED Provider Notes (Signed)
[facility] EMERGENCY DEPARTMENT Provider Note   CSN: [id number] Arrival date & time: 08/04/22  0457     History  Chief Complaint  Patient presents with   Arm Pain    Wesley [name] is a 56 y.o. male.  The history is provided by the patient.  Arm Pain  He has history of hypertension, prediabetes, hyperlipidemia, cocaine abuse, GERD, anxiety and comes in because of elevated blood pressure and pain in his left wrist.  He woke up this morning and checked his blood pressure and states it was 165/100 and he was having some pain in the left wrist which radiated up into the left forearm.  Denies chest pain, heaviness, tightness, pressure.  Denies any dyspnea, nausea, diaphoresis.  Denies any recent overuse.  He is concerned that he may have a heart attack which he is feeling the pain in his wrist.    Home Medications Prior to Admission medications   Medication Sig Start Date End Date Taking? Authorizing Provider  albuterol (VENTOLIN HFA) 108 (90 Base) MCG/ACT inhaler Inhale 1-2 puffs into the lungs every 6 (six) hours as needed for wheezing or shortness of breath. 05/03/22   Carlisle Beers, FNP  clotrimazole-betamethasone (LOTRISONE) cream Apply to affected area 2 times daily prn 07/11/22   Jannifer Franklin, MD  lisinopril (ZESTRIL) 10 MG tablet Take 1 tablet (10 mg total) by mouth daily. 07/11/22   Piontek, Denny Peon, MD  pantoprazole (PROTONIX) 40 MG tablet Take 1 tablet (40 mg total) by mouth daily. 06/26/22   Rising, Lurena Joiner, PA-C  esomeprazole (NEXIUM) 20 MG capsule Take 20 mg by mouth daily. 02/24/20 10/22/20  [provider]  famotidine (PEPCID) 20 MG tablet Take 1 tablet (20 mg total) by mouth 2 (two) times daily. 03/07/20 10/22/20  Wallis Bamberg, PA-C  sucralfate (CARAFATE) 1 GM/10ML suspension Take 10 mLs (1 g total) by mouth 4 (four) times daily -  with meals and at bedtime. 12/22/18 03/02/19  Emi Holes, PA-C      Allergies    Ibuprofen, Other, and  Zofran [ondansetron hcl]    Review of Systems   Review of Systems  All other systems reviewed and are negative.   Physical Exam Updated Vital Signs BP (!) 144/105 (BP Location: Right Arm)   Pulse (!) 103   Temp 98 F (36.7 C) (Oral)   Resp 19   Ht 5\' 6"  (1.676 m)   Wt 90.7 kg   SpO2 98%   BMI 32.28 kg/m  Physical Exam Vitals and nursing note reviewed.   55 year old male, resting comfortably and in no acute distress. Vital signs are significant for elevated blood pressure and borderline elevated heart rate. Oxygen saturation is 98%, which is normal. Head is normocephalic and atraumatic. PERRLA, EOMI. Oropharynx is clear. Neck is nontender and supple without adenopathy or JVD. Back is nontender and there is no CVA tenderness. Lungs are clear without rales, wheezes, or rhonchi. Chest is nontender. Heart has regular rate and rhythm without murmur. Abdomen is soft, flat, nontender. Extremities have no cyanosis or edema, full range of motion is present.  There is no tenderness to palpation of the left wrist, nor is there any swelling or deformity. Skin is warm and dry without rash. Neurologic: Mental status is normal, cranial nerves are intact, strength is 5/5 in all 4 extremities.  ED Results / Procedures / Treatments    EKG EKG Interpretation  Date/Time:  Friday August 04 2022 05:27:13 EST Ventricular Rate:  92  PR Interval:  135 QRS Duration: 99 QT Interval:  364 QTC Calculation: 451 R Axis:   -72 Text Interpretation: Sinus rhythm Left anterior fascicular block Abnormal R-wave progression, late transition Left ventricular hypertrophy Borderline T abnormalities, inferior leads When compared with ECG of 02/09/2022, No significant change was found Confirmed by Dione Booze [phone]) on 08/04/2022 5:29:24 AM  Procedures Procedures    Medications Ordered in ED Medications  acetaminophen (TYLENOL) tablet 650 mg (650 mg Oral Not Given 08/04/22 7858)    ED Course/ Medical  Decision Making/ A&P                           Medical Decision Making  Left wrist pain of uncertain cause.  Doubt ACS.  No evidence of significant musculoskeletal injury.  I have ordered an electrocardiogram.  Also, mildly elevated blood pressure which will need to be followed as an outpatient.  I have reviewed his past records, and he has multiple emergency department visits, many related to complaints of chest pain, blood pressure, anxiety.  This is his 11th emergency department visit in the last 6 months, in our records.  I have ordered a dose of acetaminophen.  I have reviewed and interpreted his electrocardiogram, and my interpretation is left anterior fascicular block, left ventricular hypertrophy, borderline T wave abnormality which is unchanged from prior.  He had good relief of pain with acetaminophen.  Blood pressure has come down with simple observation.  He is reassured that there is no evidence of any cardiac issues today and is discharged to follow-up with primary care provider.  Final Clinical Impression(s) / ED Diagnoses Final diagnoses:  Left arm pain  Elevated blood pressure reading with diagnosis of hypertension    Rx / DC Orders ED Discharge Orders     None         Dione Booze, MD 08/04/22 [phone]

## 2022-08-23 ENCOUNTER — Ambulatory Visit (HOSPITAL_COMMUNITY)
Admission: EM | Admit: 2022-08-23 | Discharge: 2022-08-23 | Disposition: A | Discharge status: Home / Self Care | Payer: BC Managed Care – PPO

## 2022-09-12 ENCOUNTER — Emergency Department (HOSPITAL_COMMUNITY)
Admission: EM | Admit: 2022-09-12 | Discharge: 2022-09-12 | Disposition: A | Discharge status: Home / Self Care | Payer: BC Managed Care – PPO | Attending: Emergency Medicine | Admitting: Emergency Medicine

## 2022-09-12 DIAGNOSIS — R030 Elevated blood-pressure reading, without diagnosis of hypertension: Secondary | ICD-10-CM | POA: Diagnosis present

## 2022-09-12 DIAGNOSIS — I10 Essential (primary) hypertension: Secondary | ICD-10-CM | POA: Insufficient documentation

## 2022-09-12 DIAGNOSIS — F1090 Alcohol use, unspecified, uncomplicated: Secondary | ICD-10-CM | POA: Insufficient documentation

## 2022-09-12 DIAGNOSIS — Z79899 Other long term (current) drug therapy: Secondary | ICD-10-CM | POA: Diagnosis not present

## 2022-09-12 NOTE — Discharge Instructions (Signed)
Continue taking your blood pressure medicine as prescribed.  Avoid any alcohol as able to in the future.  See your doctor this week for recheck.  Return to the ED if you have chest pain, shortness of breath, headache or vision changes.

## 2022-09-12 NOTE — ED Provider Notes (Signed)
[place] EMERGENCY DEPARTMENT Provider Note   CSN: [id number] Arrival date & time: 09/12/22  4098     History  No chief complaint on file.   Wesley [name] is a 57 y.o. male.  HPI   Patient with medical history of alcohol use disorder, cannabis, hypertension, hyperlipidemia, anxiety presents to the emergency department due to hypertension.  Patient states he woke up having pain in the back of his neck which has happened previously with muscle.  He states he has not taken his blood pressure medicine for the last 2 days so he went to check it this morning and it was elevated.  He took his home blood pressure medicine after that and came to ED for evaluation.  He is not having any chest pain, shortness of breath, headache, vision changes, abdominal pain, back pain.  Otherwise is typically medically compliant.  He does endorse alcohol use over the last few days.  Home Medications Prior to Admission medications   Medication Sig Start Date End Date Taking? Authorizing Provider  albuterol (VENTOLIN HFA) 108 (90 Base) MCG/ACT inhaler Inhale 1-2 puffs into the lungs every 6 (six) hours as needed for wheezing or shortness of breath. 05/03/22   Talbot Grumbling, FNP  clotrimazole-betamethasone (LOTRISONE) cream Apply to affected area 2 times daily prn 07/11/22   Rondel Oh, MD  lisinopril (ZESTRIL) 10 MG tablet Take 1 tablet (10 mg total) by mouth daily. 07/11/22   Piontek, Junie Panning, MD  pantoprazole (PROTONIX) 40 MG tablet Take 1 tablet (40 mg total) by mouth daily. 06/26/22   Rising, Wells Guiles, PA-C  esomeprazole (NEXIUM) 20 MG capsule Take 20 mg by mouth daily. 02/24/20 10/22/20  [provider]  famotidine (PEPCID) 20 MG tablet Take 1 tablet (20 mg total) by mouth 2 (two) times daily. 03/07/20 10/22/20  Jaynee Eagles, PA-C  sucralfate (CARAFATE) 1 GM/10ML suspension Take 10 mLs (1 g total) by mouth 4 (four) times daily -  with meals and at bedtime. 12/22/18 03/02/19  Frederica Kuster, PA-C      Allergies    Ibuprofen, Other, and Zofran [ondansetron hcl]    Review of Systems   Review of Systems  Physical Exam Updated Vital Signs BP (!) 154/100   Pulse 95   Temp 98.1 F (36.7 C) (Oral)   Resp 20   SpO2 100%  Physical Exam Vitals and nursing note reviewed. Exam conducted with a chaperone present.  Constitutional:      Appearance: Normal appearance.  HENT:     Head: Normocephalic and atraumatic.  Eyes:     General: No scleral icterus.       Right eye: No discharge.        Left eye: No discharge.     Extraocular Movements: Extraocular movements intact.     Pupils: Pupils are equal, round, and reactive to light.  Cardiovascular:     Rate and Rhythm: Normal rate and regular rhythm.     Pulses: Normal pulses.     Heart sounds: Normal heart sounds. No murmur heard.    No friction rub. No gallop.  Pulmonary:     Effort: Pulmonary effort is normal. No respiratory distress.     Breath sounds: Normal breath sounds.  Abdominal:     General: Abdomen is flat. Bowel sounds are normal. There is no distension.     Palpations: Abdomen is soft.     Tenderness: There is no abdominal tenderness.  Skin:    General: Skin is warm  and dry.     Coloration: Skin is not jaundiced.  Neurological:     Mental Status: He is alert. Mental status is at baseline.     Coordination: Coordination normal.     ED Results / Procedures / Treatments   Labs (all labs ordered are listed, but only abnormal results are displayed) Labs Reviewed - No data to display  EKG None  Radiology No results found.  Procedures Procedures    Medications Ordered in ED Medications - No data to display  ED Course/ Medical Decision Making/ A&P                           Medical Decision Making  Patient is entirely asymptomatic presenting today due to hypertension.  He took his home blood pressure medicine, blood pressure is elevated at 154/100 but not significantly high.  He is  without any obvious focal deficits on exam, not having any systemic changes.  Suspect the elevated blood pressure secondary to medical noncompliance.  Encouraged him to continue taking his home blood pressure medicine, discussed return precautions.  Patient stable for discharge in the community.  Consider checking labs.  Do not see an indication as I do not think this is ACS, hypertensive emergency, hypertensive urgency or SAH or TIA/CVA.        Final Clinical Impression(s) / ED Diagnoses Final diagnoses:  None    Rx / DC Orders ED Discharge Orders     None         Sherrill Raring, PA-C 09/12/22 0981    Malvin Johns, MD 09/12/22 1034

## 2022-09-12 NOTE — ED Triage Notes (Signed)
Patient here with complaint of neck pain and hypertension present this morning. Patient states his neck was hurting this morning so he checked his blood pressure and it was 616 systolic. Patient states he took his antihypertensive medications at 0500 this morning. Patient is alert, oriented, and in no apparent distress at this time. BP in triage 164/102.

## 2022-09-25 ENCOUNTER — Encounter (HOSPITAL_COMMUNITY): Payer: Self-pay

## 2022-09-25 ENCOUNTER — Ambulatory Visit (HOSPITAL_COMMUNITY)
Admission: EM | Admit: 2022-09-25 | Discharge: 2022-09-25 | Disposition: A | Discharge status: Home / Self Care | Payer: BC Managed Care – PPO | Attending: Internal Medicine | Admitting: Internal Medicine

## 2022-09-25 ENCOUNTER — Telehealth (HOSPITAL_COMMUNITY): Payer: Self-pay

## 2022-09-25 DIAGNOSIS — J069 Acute upper respiratory infection, unspecified: Secondary | ICD-10-CM | POA: Insufficient documentation

## 2022-09-25 DIAGNOSIS — R051 Acute cough: Secondary | ICD-10-CM | POA: Insufficient documentation

## 2022-09-25 DIAGNOSIS — R7303 Prediabetes: Secondary | ICD-10-CM | POA: Diagnosis not present

## 2022-09-25 DIAGNOSIS — I10 Essential (primary) hypertension: Secondary | ICD-10-CM | POA: Diagnosis present

## 2022-09-25 DIAGNOSIS — Z1152 Encounter for screening for COVID-19: Secondary | ICD-10-CM | POA: Insufficient documentation

## 2022-09-25 DIAGNOSIS — Z8639 Personal history of other endocrine, nutritional and metabolic disease: Secondary | ICD-10-CM

## 2022-09-25 LAB — POCT URINALYSIS DIPSTICK, ED / UC
Bilirubin Urine: NEGATIVE
Glucose, UA: NEGATIVE mg/dL
Hgb urine dipstick: NEGATIVE
Ketones, ur: NEGATIVE mg/dL
Leukocytes,Ua: NEGATIVE
Nitrite: NEGATIVE
Protein, ur: NEGATIVE mg/dL
Specific Gravity, Urine: 1.01 (ref 1.005–1.030)
Urobilinogen, UA: 0.2 mg/dL (ref 0.0–1.0)
pH: 7 (ref 5.0–8.0)

## 2022-09-25 LAB — CBG MONITORING, ED: Glucose-Capillary: 94 mg/dL (ref 70–99)

## 2022-09-25 NOTE — ED Triage Notes (Signed)
Pt is here for elevated B/P x 2days  , Pt needs a refill on b/p meds lisinopril 10mg . Pt also complains of cough, runny nose x 2days

## 2022-09-25 NOTE — ED Notes (Signed)
Pt was notified that appt for primary care , and left without getting std sample and blood work. Pt was called and declined blood work and std testing .

## 2022-09-25 NOTE — ED Provider Notes (Addendum)
[place]    CSN: [id number] Arrival date & time: 09/25/22  1015      History   Chief Complaint Chief Complaint  Patient presents with   Hypertension    HPI Wesley [name] is a 57 y.o. male with history of HTN, prediabetes and high cholesterol presents requesting Lisinopril refill and also has nose congestion and cough x 2 days. Denies fever, aches or change in appetite. States he did not look for a PCP as told in October when he was here then. He was in the ER 1/2 due to elevation of BP, but still had meds then. He has not taken his Lisinopril in 2 days. He denies chest pains, bilateral tinnitus, but does not have ringing in L ear which he notices only when he lays down. He also has decreased hearing from his L ear. Admits using Qtips.    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 Authorizing Provider  albuterol (VENTOLIN HFA) 108 (90 Base) MCG/ACT inhaler Inhale 1-2 puffs into the lungs every 6 (six) hours as needed for wheezing or shortness of breath. 05/03/22   Talbot Grumbling, FNP  clotrimazole-betamethasone (LOTRISONE) cream Apply to affected  area 2 times daily prn 07/11/22   Rondel Oh, MD  lisinopril (ZESTRIL) 10 MG tablet Take 1 tablet (10 mg total) by mouth daily. 07/11/22   Piontek, Junie Panning, MD  pantoprazole (PROTONIX) 40 MG tablet Take 1 tablet (40 mg total) by mouth daily. 06/26/22   Rising, Wells Guiles, PA-C  esomeprazole (NEXIUM) 20 MG capsule Take 20 mg by mouth daily. 02/24/20 10/22/20  [provider]  famotidine (PEPCID) 20 MG tablet Take 1 tablet (20 mg total) by mouth 2 (two) times daily. 03/07/20 10/22/20  Jaynee Eagles, PA-C  sucralfate (CARAFATE) 1 GM/10ML suspension Take 10 mLs (1 g total) by mouth 4 (four) times daily -  with meals and at bedtime. 12/22/18 03/02/19  Frederica Kuster, PA-C    Family History Family History  Problem Relation Age of Onset   Hypertension Mother    Hypertension Father    Colon cancer Neg Hx    Colon polyps Neg Hx    Esophageal cancer Neg Hx    Rectal cancer Neg Hx    Stomach cancer Neg Hx     Social History Social History   Tobacco Use   Smoking status: Former   Smokeless tobacco: Never  Scientific laboratory technician Use: Never used  Substance Use Topics   Alcohol use: Yes  Alcohol/week: 8.0 standard drinks of alcohol    Types: 8 Cans of beer per week    Comment: socially   Drug use: Not Currently     Allergies   Ibuprofen, Other, and Zofran [ondansetron hcl]   Review of Systems  Constitutional: Negative for diaphoresis and unexpected weight change.  HENT: Positive for L  tinnitus.  Has decreased hearing of his L ear Eyes: Negative for visual disturbance.  Respiratory: Negative for chest tightness and shortness of breath.  Has mild non productive cough.  Cardiovascular: Negative for chest pain, palpitations and leg swelling.  Gastrointestinal: Negative for constipation, diarrhea and nausea.  Endocrine: Negative for polydipsia, polyphagia and polyuria.  Genitourinary: Negative for dysuria and frequency.  Skin: Negative for rash and wound.  Neurological: Negative for  dizziness, speech difficulty, weakness, numbness and headaches.    Physical Exam Triage Vital Signs ED Triage Vitals  Enc Vitals Group     BP 09/25/22 1143 (!) 156/106     Pulse Rate 09/25/22 1143 86     Resp 09/25/22 1143 16     Temp 09/25/22 1143 98.5 F (36.9 C)     Temp Source 09/25/22 1143 Oral     SpO2 09/25/22 1143 97 %     Weight --      Height --      Head Circumference --      Peak Flow --      Pain Score 09/25/22 1142 0     Pain Loc --      Pain Edu? --      Excl. in [place]? --    No data found.    Updated Vital Signs BP (!) 156/106 (BP Location: Right Arm)   Pulse 86   Temp 98.5 F (36.9 C) (Oral)   Resp 16   SpO2 97%   Visual Acuity Right Eye Distance:   Left Eye Distance:   Bilateral Distance:    Right Eye Near:   Left Eye Near:    Bilateral Near:     Physical Exam  Constitutional: he is oriented to person, place, and time. He appears well-developed and well-nourished. No distress.  HENT:  Head: Normocephalic and atraumatic.  Right Ear: External ear normal.  Left Ear: External ear normal.  Nose: Nose normal.  Eyes: Conjunctivae are normal. Right eye exhibits no discharge. Left eye exhibits no discharge. No scleral icterus.  Neck: Neck supple. No thyromegaly present.  No carotid bruits bilaterally  Cardiovascular: Normal rate and regular rhythm.  No murmur heard. Pulmonary/Chest: Effort normal and breath sounds normal. No respiratory distress.  Musculoskeletal: Normal range of motion. She exhibits no edema.  Lymphadenopathy: he has no cervical adenopathy.  Neurological: he is alert and oriented to person, place, and time.  Skin: Skin is warm and dry. Capillary refill takes less than 2 seconds. No rash noted. She is not diaphoretic.  Psychiatric: he has a normal mood and affect. His behavior is normal. Judgment and thought content normal.  Nursing note reviewed.   UC Treatments / Results  Labs (all labs ordered are listed, but only abnormal  results are displayed) Labs Reviewed  SARS CORONAVIRUS 2 (TAT 6-24 HRS)  COMPREHENSIVE METABOLIC PANEL  CBC  POCT URINALYSIS DIPSTICK, ED / UC  CBG MONITORING, ED  Glucose is 94 UA negative EKG   Radiology No results found.  Procedures Procedures (including critical care time)  Medications Ordered in UC Medications - No data to display  Initial Impression / Assessment and Plan /  UC Course  I have reviewed the triage vital signs and the nursing notes. Pt had L ear lavage but was unsuccessful.  I informed pt during the initial consultation, that in order for me to prescribe medication I need to make sure he has normal labs and the last ones I found in the sytem are from 02/2022 and he was in agreement with this.  When I went to discharge him, he was not in his room.  Pt left before getting his labs done or discharge summary. Covid test sent out and we will call him if positive.   One of the MA's called him to come back to have labs drawn, but pt said he did not want to come back.  No medications were sent.     Final Clinical Impressions(s) / UC Diagnoses   Final diagnoses:  Acute cough  Essential hypertension  Acute upper respiratory infection     Discharge Instructions      I will call you when your blood work is back  Make sure to get established with a primary care doctor for continuation of medication refills and follow up. You will need them to repeat your blood work including cholesterol medication during the follow up since the cholesterol test was not done here. You will also need to have a diabetes test called Hemoglobin A1C done then. We can't continue refilling your medications month after month at the urgent care which is not the proper place for that.  Make sure to take your blood pressure medication every morning and your blood pressure needs to be less than 140/90.   Keep record of them to take to your new doctor.   Also call the practices above to  see if you can get established with them.  Your covid test wont be back until tomorrow, so we will call you if positive. In the mean time stay quarantined.   For the wax blockage, use Debrox drops at night time and soak it for 15 minutes for 7-10 days, then get that ear to be washed off.      ED Prescriptions   None    PDMP not reviewed this encounter.   Garey Ham, PA-C 09/25/22 1548    Rodriguez-Southworth, [place], PA-C 09/25/22 1840

## 2022-09-25 NOTE — Discharge Instructions (Addendum)
I will call you when your blood work is back  Make sure to get established with a primary care doctor for continuation of medication refills and follow up. You will need them to repeat your blood work including cholesterol medication during the follow up since the cholesterol test was not done here. You will also need to have a diabetes test called Hemoglobin A1C done then. We can't continue refilling your medications month after month at the urgent care which is not the proper place for that.  Make sure to take your blood pressure medication every morning and your blood pressure needs to be less than 140/90.   Keep record of them to take to your new doctor.   Also call the practices above to see if you can get established with them.  Your covid test wont be back until tomorrow, so we will call you if positive. In the mean time stay quarantined.   For the wax blockage, use Debrox drops at night time and soak it for 15 minutes for 7-10 days, then get that ear to be washed off.

## 2022-09-26 LAB — SARS CORONAVIRUS 2 (TAT 6-24 HRS): SARS Coronavirus 2: NEGATIVE

## 2022-09-29 ENCOUNTER — Encounter (HOSPITAL_COMMUNITY): Payer: Self-pay | Admitting: Emergency Medicine

## 2022-09-29 ENCOUNTER — Ambulatory Visit (HOSPITAL_COMMUNITY)
Admission: EM | Admit: 2022-09-29 | Discharge: 2022-09-29 | Disposition: A | Discharge status: Home / Self Care | Payer: BC Managed Care – PPO | Attending: Family Medicine | Admitting: Family Medicine

## 2022-09-29 ENCOUNTER — Ambulatory Visit (INDEPENDENT_AMBULATORY_CARE_PROVIDER_SITE_OTHER): Payer: BC Managed Care – PPO

## 2022-09-29 DIAGNOSIS — J189 Pneumonia, unspecified organism: Secondary | ICD-10-CM | POA: Diagnosis not present

## 2022-09-29 DIAGNOSIS — R509 Fever, unspecified: Secondary | ICD-10-CM | POA: Diagnosis not present

## 2022-09-29 DIAGNOSIS — R059 Cough, unspecified: Secondary | ICD-10-CM | POA: Diagnosis not present

## 2022-09-29 MED ORDER — DOXYCYCLINE HYCLATE 100 MG PO CAPS: 100.0000 mg | ORAL_CAPSULE | Freq: Two times a day (BID) | ORAL | 0 refills | Status: AC

## 2022-09-29 MED ORDER — BENZONATATE 100 MG PO CAPS: 100.0000 mg | ORAL_CAPSULE | Freq: Three times a day (TID) | ORAL | 0 refills | Status: DC | PRN

## 2022-09-29 NOTE — ED Triage Notes (Signed)
Pt reports having cough that is productive and body aches for about 4 days or so. Reports was seen here 1/15 and covid was negative.

## 2022-09-29 NOTE — Discharge Instructions (Addendum)
Your chest x-ray showed possible viral or atypical pneumonia (habia infeccion en los pulmones en las radiografias)  Take doxycycline 100 mg --1 capsule 2 times daily for 7 days; it's the antibiotic (T[place] 1 capsula por la boca 2 veces al dia por 7 dias; es el antibiotico)  Take benzonatate 100 mg, 1 tab every 8 hours as needed for cough.  [facility] 1 capsula 3 veces al dia cuando tiene tos)

## 2022-09-29 NOTE — ED Provider Notes (Signed)
[facility]    CSN: [id number] Arrival date & time: 09/29/22  1222      History   Chief Complaint Chief Complaint  Patient presents with   Cough   Generalized Body Aches    HPI Wesley [name] is a 57 y.o. male.    Cough  For cough and congestion that began on January 14.  He has had subjective fever.  No nausea or vomiting or diarrhea.  He was seen here on the 15th and COVID was negative at that point. 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 Authorizing Provider  benzonatate (TESSALON) 100 MG capsule Take 1 capsule (100 mg total) by mouth 3 (three) times daily as needed for cough. 09/29/22  Yes Garret Teale, Janace Aris, MD  doxycycline (VIBRAMYCIN) 100 MG capsule Take 1 capsule (100 mg total) by mouth 2 (two) times daily for 7 days. 09/29/22 10/06/22 Yes Zenia Resides, MD  albuterol (VENTOLIN HFA) 108 (90 Base) MCG/ACT inhaler Inhale 1-2 puffs into the lungs every 6 (six) hours as needed for wheezing or shortness of breath. 05/03/22   Carlisle Beers, FNP  clotrimazole-betamethasone (LOTRISONE) cream Apply to affected area 2 times daily prn  07/11/22   Jannifer Franklin, MD  lisinopril (ZESTRIL) 10 MG tablet Take 1 tablet (10 mg total) by mouth daily. 07/11/22   Piontek, Denny Peon, MD  pantoprazole (PROTONIX) 40 MG tablet Take 1 tablet (40 mg total) by mouth daily. 06/26/22   Rising, Lurena Joiner, PA-C  esomeprazole (NEXIUM) 20 MG capsule Take 20 mg by mouth daily. 02/24/20 10/22/20  [provider]  famotidine (PEPCID) 20 MG tablet Take 1 tablet (20 mg total) by mouth 2 (two) times daily. 03/07/20 10/22/20  Wallis Bamberg, PA-C  sucralfate (CARAFATE) 1 GM/10ML suspension Take 10 mLs (1 g total) by mouth 4 (four) times daily -  with meals and at bedtime. 12/22/18 03/02/19  Emi Holes, PA-C    Family History Family History  Problem Relation Age of Onset   Hypertension Mother    Hypertension Father    Colon cancer Neg Hx    Colon polyps Neg Hx    Esophageal cancer Neg Hx    Rectal cancer Neg Hx    Stomach cancer Neg Hx     Social History Social History   Tobacco Use   Smoking status: Former   Smokeless tobacco: Never  Building services engineer Use: Never used  Substance Use Topics   Alcohol use: Yes    Alcohol/week: 8.0 standard drinks  of alcohol    Types: 8 Cans of beer per week    Comment: socially   Drug use: Not Currently     Allergies   Ibuprofen, Other, and Zofran [ondansetron hcl]   Review of Systems Review of Systems  Respiratory:  Positive for cough.      Physical Exam Triage Vital Signs ED Triage Vitals [09/29/22 1429]  Enc Vitals Group     BP 134/88     Pulse Rate 86     Resp 17     Temp 97.7 F (36.5 C)     Temp Source Oral     SpO2 97 %     Weight      Height      Head Circumference      Peak Flow      Pain Score 4     Pain Loc      Pain Edu?      Excl. in [place]?    No data found.  Updated Vital Signs BP 134/88 (BP Location: Left Arm)   Pulse 86   Temp 97.7 F (36.5 C) (Oral)   Resp 17   SpO2 97%   Visual Acuity Right Eye Distance:   Left Eye Distance:   Bilateral Distance:     Right Eye Near:   Left Eye Near:    Bilateral Near:     Physical Exam Vitals reviewed.  Constitutional:      General: He is not in acute distress.    Appearance: He is not toxic-appearing.  HENT:     Right Ear: Tympanic membrane and ear canal normal.     Left Ear: Tympanic membrane and ear canal normal.     Nose: Nose normal.     Mouth/Throat:     Mouth: Mucous membranes are moist.     Pharynx: No oropharyngeal exudate or posterior oropharyngeal erythema.  Eyes:     Extraocular Movements: Extraocular movements intact.     Conjunctiva/sclera: Conjunctivae normal.     Pupils: Pupils are equal, round, and reactive to light.  Cardiovascular:     Rate and Rhythm: Normal rate and regular rhythm.     Heart sounds: No murmur heard. Pulmonary:     Effort: No respiratory distress.     Breath sounds: No stridor. No wheezing, rhonchi or rales.  Musculoskeletal:     Cervical back: Neck supple.  Lymphadenopathy:     Cervical: No cervical adenopathy.  Skin:    Capillary Refill: Capillary refill takes less than 2 seconds.     Coloration: Skin is not jaundiced or pale.  Neurological:     General: No focal deficit present.     Mental Status: He is alert and oriented to person, place, and time.  Psychiatric:        Behavior: Behavior normal.      UC Treatments / Results  Labs (all labs ordered are listed, but only abnormal results are displayed) Labs Reviewed - No data to display  EKG   Radiology DG Chest 2 View  Result Date: 09/29/2022 CLINICAL DATA:  Cough and fever x5 days.  COVID negative on 01/15. EXAM: CHEST - 2 VIEW COMPARISON:  08/02/2018. FINDINGS: No lobar consolidation. Reticular opacities in the lung bases. Normal heart size and mediastinal contours. No pleural effusion or pneumothorax. Visualized bones and upper abdomen are unremarkable. IMPRESSION: Reticular opacities in the lung bases, may reflect atypical/viral infection. No lobar consolidation. Electronically  Signed   By: Lyndal Rainbow.D.  On: 09/29/2022 15:03    Procedures Procedures (including critical care time)  Medications Ordered in UC Medications - No data to display  Initial Impression / Assessment and Plan / UC Course  I have reviewed the triage vital signs and the nursing notes.  Pertinent labs & imaging results that were available during my care of the patient were reviewed by me and considered in my medical decision making (see chart for details).        Chest x-ray shows some reticular opacities that are consistent with either atypical or viral infection.  Doxycycline sent in for possible pneumonia or atypical infection. Final Clinical Impressions(s) / UC Diagnoses   Final diagnoses:  Pneumonia of both lower lobes due to infectious organism     Discharge Instructions      Your chest x-ray showed possible viral or atypical pneumonia (habia infeccion en los pulmones en las radiografias)  Take doxycycline 100 mg --1 capsule 2 times daily for 7 days; it's the antibiotic (Tome 1 capsula por la boca 2 veces al dia por 7 dias; es el antibiotico)  Take benzonatate 100 mg, 1 tab every 8 hours as needed for cough.  (Tome 1 capsula 3 veces al dia cuando tiene tos)        ED Prescriptions     Medication Sig Dispense Auth. Provider   doxycycline (VIBRAMYCIN) 100 MG capsule Take 1 capsule (100 mg total) by mouth 2 (two) times daily for 7 days. 14 capsule Katherin Ramey, Gwenlyn Perking, MD   benzonatate (TESSALON) 100 MG capsule Take 1 capsule (100 mg total) by mouth 3 (three) times daily as needed for cough. 21 capsule Barrett Henle, MD      I have reviewed the PDMP during this encounter.   Barrett Henle, MD 09/29/22 1520

## 2022-10-02 ENCOUNTER — Encounter: Payer: Self-pay | Admitting: Family

## 2022-10-02 ENCOUNTER — Ambulatory Visit (HOSPITAL_COMMUNITY)
Admission: EM | Admit: 2022-10-02 | Discharge: 2022-10-02 | Discharge status: Against Medical Advice | Payer: BC Managed Care – PPO | Attending: Internal Medicine | Admitting: Internal Medicine

## 2022-10-02 NOTE — ED Triage Notes (Signed)
PT called from front lobby with no answer 

## 2022-10-02 NOTE — ED Triage Notes (Addendum)
PT called from front lobby no answer from PT.

## 2022-10-23 ENCOUNTER — Encounter (HOSPITAL_COMMUNITY): Payer: Self-pay | Admitting: Internal Medicine

## 2022-12-11 ENCOUNTER — Ambulatory Visit (INDEPENDENT_AMBULATORY_CARE_PROVIDER_SITE_OTHER): Payer: Self-pay

## 2022-12-11 ENCOUNTER — Encounter (HOSPITAL_COMMUNITY): Payer: Self-pay | Admitting: Physician Assistant

## 2022-12-11 ENCOUNTER — Ambulatory Visit (HOSPITAL_COMMUNITY)
Admission: EM | Admit: 2022-12-11 | Discharge: 2022-12-11 | Disposition: A | Discharge status: Home / Self Care | Payer: Self-pay | Attending: Physician Assistant | Admitting: Physician Assistant

## 2022-12-11 DIAGNOSIS — M25562 Pain in left knee: Secondary | ICD-10-CM

## 2022-12-11 DIAGNOSIS — M25462 Effusion, left knee: Secondary | ICD-10-CM | POA: Insufficient documentation

## 2022-12-11 LAB — CBC WITH DIFFERENTIAL/PLATELET
Abs Immature Granulocytes: 0.02 10*3/uL (ref 0.00–0.07)
Basophils Absolute: 0 10*3/uL (ref 0.0–0.1)
Basophils Relative: 0 %
Eosinophils Absolute: 0.3 10*3/uL (ref 0.0–0.5)
Eosinophils Relative: 3 %
HCT: 44.5 % (ref 39.0–52.0)
Hemoglobin: 14.1 g/dL (ref 13.0–17.0)
Immature Granulocytes: 0 %
Lymphocytes Relative: 25 %
Lymphs Abs: 1.9 10*3/uL (ref 0.7–4.0)
MCH: 27.9 pg (ref 26.0–34.0)
MCHC: 31.7 g/dL (ref 30.0–36.0)
MCV: 88.1 fL (ref 80.0–100.0)
Monocytes Absolute: 0.7 10*3/uL (ref 0.1–1.0)
Monocytes Relative: 9 %
Neutro Abs: 4.9 10*3/uL (ref 1.7–7.7)
Neutrophils Relative %: 63 %
Platelets: 351 10*3/uL (ref 150–400)
RBC: 5.05 MIL/uL (ref 4.22–5.81)
RDW: 13.2 % (ref 11.5–15.5)
WBC: 7.8 10*3/uL (ref 4.0–10.5)
nRBC: 0 % (ref 0.0–0.2)

## 2022-12-11 LAB — BASIC METABOLIC PANEL
Anion gap: 13 (ref 5–15)
BUN: 7 mg/dL (ref 6–20)
CO2: 26 mmol/L (ref 22–32)
Calcium: 9.4 mg/dL (ref 8.9–10.3)
Chloride: 98 mmol/L (ref 98–111)
Creatinine, Ser: 0.86 mg/dL (ref 0.61–1.24)
GFR, Estimated: >60 mL/min (ref >60)
Glucose, Bld: 79 mg/dL (ref 70–99)
Potassium: 3.7 mmol/L (ref 3.5–5.1)
Sodium: 137 mmol/L (ref 135–145)

## 2022-12-11 LAB — URIC ACID: Uric Acid, Serum: 6.2 mg/dL (ref 3.7–8.6)

## 2022-12-11 MED ORDER — MELOXICAM 15 MG PO TABS: 15.0000 mg | ORAL_TABLET | Freq: Every day | ORAL | 1 refills | Status: DC

## 2022-12-11 NOTE — Discharge Instructions (Signed)
Your x-rays were normal.  Start Mobic to help with pain and inflammation.  This is likely related to ibuprofen but it appears you have taken this and not had any reaction in the past.  If you develop any scratchy throat, rash, concerning symptoms stop medication to be seen immediately.  Use Tylenol for additional pain relief.  I want you to follow-up with orthopedics; call to schedule an appointment.  Use the Ace bandage to help with pain and swelling.  We will contact you if your lab work is abnormal.  If you develop any fever, increasing pain, swelling, difficulty walking, nausea, vomiting you need to be seen immediately.

## 2022-12-11 NOTE — ED Provider Notes (Signed)
[facility]    CSN: [id number] Arrival date & time: 12/11/22  0815      History   Chief Complaint Chief Complaint  Patient presents with   Leg Swelling    HPI Wesley [name] is a 57 y.o. male.   Patient presents today with a several day history of left knee pain.  Reports that a few days prior to symptoms beginning he hit his knee with some concrete but did not initially have pain.  He then developed redness and swelling over his knee that has worsened prompting evaluation.  He reports that pain is rated 5 on a 0-10 pain scale, localized to left knee, described as aching, no aggravating or alleviating factors identified.  Denies any fever, nausea, vomiting.  He has not tried any over-the-counter medication for symptom management.  Denies previous injury or surgery involving knee.  Denies history of gout or inflammatory arthritis.  Denies any recent medication changes.  He does report occasional numbness in his left foot but on further discussion he reports this has been intermittent for many months and worsens when he is sitting for prolonged period of time driving.  Denies any recent dietary changes or increased alcohol consumption.    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 Authorizing Provider  lisinopril (ZESTRIL) 10 MG tablet Take 1 tablet (10 mg total) by mouth daily. 07/11/22  Yes Piontek, Curly Mackowski, MD  meloxicam (MOBIC) 15 MG tablet Take 1 tablet (15 mg total) by mouth daily. 12/11/22  Yes Liannah Yarbough K, PA-C  pantoprazole (PROTONIX) 40 MG tablet Take 1 tablet (40 mg total) by mouth daily. 06/26/22  Yes Rising, Wells Guiles, PA-C  albuterol (VENTOLIN HFA) 108 (90 Base) MCG/ACT inhaler Inhale 1-2 puffs into the lungs every 6 (six) hours as needed for wheezing or shortness of breath. 05/03/22   Talbot Grumbling, FNP  esomeprazole (NEXIUM) 20 MG capsule Take 20 mg by mouth daily. 02/24/20 10/22/20  [provider]  famotidine (PEPCID) 20 MG tablet Take 1 tablet (20 mg total) by mouth 2 (two) times daily. 03/07/20 10/22/20  Jaynee Eagles, PA-C  sucralfate (CARAFATE) 1 GM/10ML suspension Take 10 mLs (1 g total) by mouth 4 (four) times daily -  with meals and at bedtime. 12/22/18 03/02/19  Frederica Kuster, PA-C    Family History Family History  Problem Relation Age of Onset   Hypertension Mother    Hypertension Father    Colon cancer Neg Hx    Colon polyps Neg Hx  Esophageal cancer Neg Hx    Rectal cancer Neg Hx    Stomach cancer Neg Hx     Social History Social History   Tobacco Use   Smoking status: Former   Smokeless tobacco: Never  Scientific laboratory technician Use: Never used  Substance Use Topics   Alcohol use: Yes    Alcohol/week: 8.0 standard drinks of alcohol    Types: 8 Cans of beer per week    Comment: socially   Drug use: Not Currently     Allergies   Ibuprofen, Other, and Zofran [ondansetron hcl]   Review of Systems Review of Systems  Constitutional:  Positive for activity change. Negative for appetite change, fatigue and fever.  Respiratory:  Negative for cough and shortness of breath.   Cardiovascular:  Negative for  chest pain.  Gastrointestinal:  Negative for abdominal pain, diarrhea, nausea and vomiting.  Musculoskeletal:  Positive for arthralgias, gait problem and joint swelling. Negative for myalgias.  Neurological:  Positive for numbness (intermittent in feet). Negative for weakness.     Physical Exam Triage Vital Signs ED Triage Vitals  Enc Vitals Group     BP 12/11/22 0856 (!) 139/96     Pulse Rate 12/11/22 0856 94     Resp 12/11/22 0856 16     Temp 12/11/22 0856 98.2 F (36.8 C)     Temp Source 12/11/22 0856 Oral     SpO2 12/11/22 0856 97 %     Weight --      Height --      Head Circumference --      Peak Flow --      Pain Score 12/11/22 0852 5     Pain Loc --      Pain Edu? --      Excl. in [place]? --    No data found.  Updated Vital Signs BP (!) 139/96 (BP Location: Left Arm)   Pulse 94   Temp 98.2 F (36.8 C) (Oral)   Resp 16   SpO2 97%   Visual Acuity Right Eye Distance:   Left Eye Distance:   Bilateral Distance:    Right Eye Near:   Left Eye Near:    Bilateral Near:     Physical Exam Vitals reviewed.  Constitutional:      General: He is awake.     Appearance: Normal appearance. He is well-developed. He is not ill-appearing.     Comments: Very pleasant male stated age in no acute distress sitting comfortably in exam room table  HENT:     Head: Normocephalic and atraumatic.  Cardiovascular:     Rate and Rhythm: Normal rate and regular rhythm.     Pulses:          Posterior tibial pulses are 2+ on the right side and 2+ on the left side.     Heart sounds: Normal heart sounds, S1 normal and S2 normal. No murmur heard. Pulmonary:     Effort: Pulmonary effort is normal.     Breath sounds: Normal breath sounds. No stridor. No wheezing, rhonchi or rales.     Comments: Clear to auscultation bilaterally Musculoskeletal:     Left knee: Swelling present. No effusion or bony tenderness. Decreased range of motion. Tenderness present over the patellar tendon. No LCL  laxity, MCL laxity, ACL laxity or PCL laxity.    Instability Tests: Anterior drawer test negative. Posterior drawer test negative.     Comments: Left knee: Erythema and swelling noted anterior  patella.  No deformity noted.  Normal active range of motion though patient experiences pain with flexion.  Tenderness palpation over patella and patellar tendon.  No ligamentous laxity on exam.  Neurological:     Mental Status: He is alert.  Psychiatric:        Behavior: Behavior is cooperative.      UC Treatments / Results  Labs (all labs ordered are listed, but only abnormal results are displayed) Labs Reviewed  CBC WITH DIFFERENTIAL/PLATELET  BASIC METABOLIC PANEL  URIC ACID    EKG   Radiology DG Knee Complete 4 Views Left  Result Date: 12/11/2022 CLINICAL DATA:  Pain EXAM: LEFT KNEE - COMPLETE 4 VIEW COMPARISON:  None Available. FINDINGS: No evidence of fracture, dislocation, or joint effusion. No evidence of arthropathy or other focal bone abnormality. There is prepatellar soft tissue swelling. IMPRESSION: Soft tissue swelling anterior to the knee. No acute osseous abnormalities. Electronically Signed   By: Sammie Bench M.D.   On: 12/11/2022 09:35    Procedures Procedures (including critical care time)  Medications Ordered in UC Medications - No data to display  Initial Impression / Assessment and Plan / UC Course  I have reviewed the triage vital signs and the nursing notes.  Pertinent labs & imaging results that were available during my care of the patient were reviewed by me and considered in my medical decision making (see chart for details).     Patient is well-appearing, afebrile, nontoxic, nontachycardic.  Low suspicion for septic arthritis given clinical presentation and stable vital signs.  X-ray was obtained as he did report hitting his knee with piece of concrete prior to symptoms beginning which showed soft tissue swelling without acute osseous abnormality.  Suspect  symptoms are related to prepatellar bursitis.  Patient is unable to take steroids due to previous reaction.  He has had a dry scratchy throat with ibuprofen in the past but discussed that he has been able to take Mobic without difficulty so this was sent to pharmacy.  Ibuprofen allergy is listed from 2017 he has taken Mobic as recently as 2019 without difficulty.  Recommended RICE protocol and he was given an Ace bandage to help manage his symptoms.  Recommended that he follow-up with orthopedics and was given contact information for local provider with instruction to call to schedule an appointment.  Labs including CBC, BMP, uric acid were obtained.  If he has significant leukocytosis he would need to be reevaluated.  If uric acid is elevated may consider switch to colchicine to help manage symptoms.  Discussed that if he has any worsening or changing symptoms including increasing pain, additional swelling, fever, nausea, vomiting he needs to be seen immediately.  Strict return precautions given.  Work excuse note provided.  Final Clinical Impressions(s) / UC Diagnoses   Final diagnoses:  Effusion of bursa of left knee  Acute pain of left knee     Discharge Instructions      Your x-rays were normal.  Start Mobic to help with pain and inflammation.  This is likely related to ibuprofen but it appears you have taken this and not had any reaction in the past.  If you develop any scratchy throat, rash, concerning symptoms stop medication to be seen immediately.  Use Tylenol for additional pain relief.  I want you to follow-up with orthopedics; call to schedule an appointment.  Use the Ace bandage to help with pain and swelling.  We will contact you if your lab work is  abnormal.  If you develop any fever, increasing pain, swelling, difficulty walking, nausea, vomiting you need to be seen immediately.     ED Prescriptions     Medication Sig Dispense Auth. Provider   meloxicam (MOBIC) 15 MG tablet Take  1 tablet (15 mg total) by mouth daily. 30 tablet Dewell Monnier, Derry Skill, PA-C      PDMP not reviewed this encounter.   Terrilee Croak, PA-C 12/11/22 1005

## 2022-12-11 NOTE — ED Triage Notes (Signed)
Pt injured  left knee  xfew days ago , causing swelling and pain  x 3days . Pt states he feels pain in left foot as well . Pt hears sounds in the left ear x 59month

## 2023-01-01 ENCOUNTER — Encounter (HOSPITAL_COMMUNITY): Payer: Self-pay

## 2023-01-01 ENCOUNTER — Ambulatory Visit (HOSPITAL_COMMUNITY)
Admission: EM | Admit: 2023-01-01 | Discharge: 2023-01-01 | Disposition: A | Discharge status: Home / Self Care | Payer: Self-pay | Attending: Physician Assistant | Admitting: Physician Assistant

## 2023-01-01 DIAGNOSIS — H6123 Impacted cerumen, bilateral: Secondary | ICD-10-CM

## 2023-01-01 NOTE — ED Provider Notes (Signed)
[facility]    CSN: [id number] Arrival date & time: 01/01/23  1902      History   Chief Complaint Chief Complaint  Patient presents with   Otalgia    HPI Wesley [name] is a 57 y.o. male.   Patient complains of bilateral ear fullness.  Right greater than left.  He reports noticing a muffled sound to the left ear for the last few days.  He reports feeling like something is in the right ear.  He denies pain.  He denies congestion, postnasal drip, sinus issues.  He has tried nothing for the symptoms.    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 Authorizing Provider  albuterol (VENTOLIN HFA) 108 (90 Base) MCG/ACT inhaler Inhale 1-2 puffs into the lungs every 6 (six) hours as needed for wheezing or shortness of breath. 05/03/22   Carlisle Beers, FNP  lisinopril (ZESTRIL) 10 MG tablet Take 1 tablet (10 mg total) by mouth daily. 07/11/22   Piontek, Denny Peon, MD  meloxicam (MOBIC) 15 MG tablet Take 1 tablet (15 mg total) by mouth daily. 12/11/22   Raspet, Noberto Retort, PA-C  pantoprazole (PROTONIX) 40 MG tablet Take 1 tablet (40 mg total) by mouth daily. 06/26/22    Rising, Lurena Joiner, PA-C  esomeprazole (NEXIUM) 20 MG capsule Take 20 mg by mouth daily. 02/24/20 10/22/20  [provider]  famotidine (PEPCID) 20 MG tablet Take 1 tablet (20 mg total) by mouth 2 (two) times daily. 03/07/20 10/22/20  Wallis Bamberg, PA-C  sucralfate (CARAFATE) 1 GM/10ML suspension Take 10 mLs (1 g total) by mouth 4 (four) times daily -  with meals and at bedtime. 12/22/18 03/02/19  Emi Holes, PA-C    Family History Family History  Problem Relation Age of Onset   Hypertension Mother    Hypertension Father    Colon cancer Neg Hx    Colon polyps Neg Hx    Esophageal cancer Neg Hx    Rectal cancer Neg Hx    Stomach cancer Neg Hx     Social History Social History   Tobacco Use   Smoking status: Former   Smokeless tobacco: Never  Building services engineer Use: Never used  Substance Use Topics   Alcohol use: Yes    Alcohol/week: 8.0 standard drinks of alcohol    Types: 8 Cans of beer per week    Comment: socially   Drug use: Not Currently     Allergies   Ibuprofen, Other, and Zofran [ondansetron hcl]  Review of Systems Review of Systems  Constitutional:  Negative for chills and fever.  HENT:  Positive for ear pain. Negative for sore throat.   Eyes:  Negative for pain and visual disturbance.  Respiratory:  Negative for cough and shortness of breath.   Cardiovascular:  Negative for chest pain and palpitations.  Gastrointestinal:  Negative for abdominal pain and vomiting.  Genitourinary:  Negative for dysuria and hematuria.  Musculoskeletal:  Negative for arthralgias and back pain.  Skin:  Negative for color change and rash.  Neurological:  Negative for seizures and syncope.  All other systems reviewed and are negative.    Physical Exam Triage Vital Signs ED Triage Vitals  Enc Vitals Group     BP 01/01/23 2005 139/86     Pulse --      Resp 01/01/23 2005 18     Temp 01/01/23 2006 97.9 F (36.6 C)     Temp Source 01/01/23 2006 Oral     SpO2  01/01/23 2005 95 %     Weight --      Height --      Head Circumference --      Peak Flow --      Pain Score --      Pain Loc --      Pain Edu? --      Excl. in GC? --    No data found.  Updated Vital Signs BP 139/86 (BP Location: Left Arm)   Temp 97.9 F (36.6 C) (Oral)   Resp 18   SpO2 95%   Visual Acuity Right Eye Distance:   Left Eye Distance:   Bilateral Distance:    Right Eye Near:   Left Eye Near:    Bilateral Near:     Physical Exam Vitals and nursing note reviewed.  Constitutional:      General: He is not in acute distress.    Appearance: He is well-developed.  HENT:     Head: Normocephalic and atraumatic.     Ears:     Comments: Cerumen impaction bilaterally, dry wax.  Unable to visualize TM.  Hearing normal. Eyes:     Conjunctiva/sclera: Conjunctivae normal.  Cardiovascular:     Rate and Rhythm: Normal rate and regular rhythm.     Heart sounds: No murmur heard. Pulmonary:     Effort: Pulmonary effort is normal. No respiratory distress.     Breath sounds: Normal breath sounds.  Abdominal:     Palpations: Abdomen is soft.     Tenderness: There is no abdominal tenderness.  Musculoskeletal:        General: No swelling.     Cervical back: Neck supple.  Skin:    General: Skin is warm and dry.     Capillary Refill: Capillary refill takes less than 2 seconds.  Neurological:     Mental Status: He is alert.  Psychiatric:        Mood and Affect: Mood normal.      UC Treatments / Results  Labs (all labs ordered are listed, but only abnormal results are displayed) Labs Reviewed - No data to display  EKG   Radiology No results found.  Procedures Procedures (including critical care time)  Medications Ordered in UC Medications - No data to display  Initial Impression / Assessment and Plan / UC Course  I have reviewed the triage vital signs and the nursing notes.  Pertinent labs & imaging results that were available during my care of the  patient  were reviewed by me and considered in my medical decision making (see chart for details).     Ear lavage attempted in clinic today with minimal improvement.  Advised patient to pick up Debrox from the pharmacy to use over the next few days and may return for repeat ear lavage if necessary. Final Clinical Impressions(s) / UC Diagnoses   Final diagnoses:  Bilateral impacted cerumen     Discharge Instructions      Recommend continued use of over-the-counter Debrox for the next few days.  Can return here after using Debrox if needed for [place] to flush the ears.      ED Prescriptions   None    PDMP not reviewed this encounter.   Ward, Tylene Fantasia, PA-C 01/01/23 2035

## 2023-01-01 NOTE — Discharge Instructions (Addendum)
Recommend continued use of over-the-counter Debrox for the next few days.  Can return here after using Debrox if needed for [place] to flush the ears.

## 2023-01-01 NOTE — ED Triage Notes (Signed)
Pt reports right ear pain for several days.  

## 2023-01-24 ENCOUNTER — Encounter (HOSPITAL_COMMUNITY): Payer: Self-pay | Admitting: Emergency Medicine

## 2023-01-24 ENCOUNTER — Ambulatory Visit (HOSPITAL_COMMUNITY)
Admission: EM | Admit: 2023-01-24 | Discharge: 2023-01-24 | Disposition: A | Discharge status: Home / Self Care | Payer: Self-pay | Attending: Emergency Medicine | Admitting: Emergency Medicine

## 2023-01-24 DIAGNOSIS — I10 Essential (primary) hypertension: Secondary | ICD-10-CM

## 2023-01-24 DIAGNOSIS — H6123 Impacted cerumen, bilateral: Secondary | ICD-10-CM

## 2023-01-24 DIAGNOSIS — J069 Acute upper respiratory infection, unspecified: Secondary | ICD-10-CM

## 2023-01-24 LAB — POCT FASTING CBG KUC MANUAL ENTRY: POCT Glucose (KUC): 153 mg/dL — AB (ref 70–99)

## 2023-01-24 MED ORDER — LISINOPRIL 10 MG PO TABS: 10.0000 mg | ORAL_TABLET | Freq: Every day | ORAL | 0 refills | Status: DC

## 2023-01-24 MED ORDER — CARBAMIDE PEROXIDE 6.5 % OT SOLN: 5.0000 [drp] | Freq: Two times a day (BID) | OTIC | 0 refills | Status: DC

## 2023-01-24 MED ORDER — GUAIFENESIN ER 600 MG PO TB12: 1200.0000 mg | ORAL_TABLET | Freq: Two times a day (BID) | ORAL | 0 refills | Status: AC

## 2023-01-24 NOTE — ED Provider Notes (Signed)
[facility]    CSN: [id number] Arrival date & time: 01/24/23  1326      History   Chief Complaint Chief Complaint  Patient presents with   Nasal Congestion   Cough    HPI Julio Stephani is a 57 y.o. male.   Patient presents to clinic for multiple complaints.  Ran out of lisinopril about 2 days ago and would like a refill.  He denies headache or blurred vision.  He does not have a primary care provider.  Denies any chest pain or shortness of breath.  Denies frequent urination.  He reports his hearing is a little diminished on the right side, reports he had his earwax removed at this clinic previously and would like to be  Rechecked.  Sometimes he has left lower leg numbness and tingling.  Reports he was told he was prediabetic a few years ago and would like his blood sugar checked today.  Denies any recent falls or trauma.  Since yesterday he has had nasal congestion, nonproductive cough and sinus pressure as well as sore throat.  He did take Tylenol this morning.  Denies any fevers.  He has a history of alcohol abuse, reports he stopped drinking alcohol about a week ago.  Prior to that he was only drinking on the weekends.   The history is provided by the patient and medical records.  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 Authorizing Provider  carbamide peroxide (DEBROX) 6.5 % OTIC solution Place 5 drops into both ears 2 (two) times daily. 01/24/23  Yes Rinaldo Ratel, [place] N, FNP  guaiFENesin (MUCINEX) 600 MG 12 hr tablet Take 2 tablets (1,200 mg total) by mouth 2 (two) times daily for 7 days. 01/24/23 01/31/23 Yes Rinaldo Ratel, [place] N, FNP  albuterol (VENTOLIN HFA) 108 (90 Base) MCG/ACT inhaler Inhale 1-2 puffs into the lungs every 6 (six) hours as needed for wheezing or shortness of breath. 05/03/22   Carlisle Beers, FNP  lisinopril (ZESTRIL) 10 MG tablet Take 1 tablet (10 mg total) by mouth daily. 01/24/23   Charlee Whitebread, [place] N, FNP  meloxicam (MOBIC) 15 MG tablet Take 1 tablet (15 mg total) by mouth daily. 12/11/22   Raspet, Noberto Retort, PA-C  pantoprazole (PROTONIX) 40 MG tablet Take 1 tablet (40 mg total) by mouth daily. 06/26/22   Rising, Lurena Joiner, PA-C  esomeprazole (NEXIUM) 20 MG capsule Take 20 mg by mouth daily. 02/24/20 10/22/20  [provider]  famotidine (PEPCID) 20 MG tablet Take 1  tablet (20 mg total) by mouth 2 (two) times daily. 03/07/20 10/22/20  Wallis Bamberg, PA-C  sucralfate (CARAFATE) 1 GM/10ML suspension Take 10 mLs (1 g total) by mouth 4 (four) times daily -  with meals and at bedtime. 12/22/18 03/02/19  Emi Holes, PA-C    Family History Family History  Problem Relation Age of Onset   Hypertension Mother    Hypertension Father    Colon cancer Neg Hx    Colon polyps Neg Hx    Esophageal cancer Neg Hx    Rectal cancer Neg Hx    Stomach cancer Neg Hx     Social History Social History   Tobacco Use   Smoking status: Former   Smokeless tobacco: Never  Building services engineer Use: Never used  Substance Use Topics   Alcohol use: Yes    Alcohol/week: 8.0 standard drinks of alcohol     Types: 8 Cans of beer per week    Comment: socially   Drug use: Not Currently     Allergies   Ibuprofen, Other, and Zofran [ondansetron hcl]   Review of Systems Review of Systems  Respiratory:  Positive for cough.      Physical Exam Triage Vital Signs ED Triage Vitals  Enc Vitals Group     BP 01/24/23 1345 (!) 137/90     Pulse Rate 01/24/23 1345 89     Resp 01/24/23 1345 20     Temp 01/24/23 1345 97.8 F (36.6 C)     Temp Source 01/24/23 1345 Oral     SpO2 01/24/23 1345 96 %     Weight --      Height --      Head Circumference --      Peak Flow --      Pain Score 01/24/23 1344 0     Pain Loc --      Pain Edu? --      Excl. in GC? --    No data found.  Updated Vital Signs BP (!) 137/90 (BP Location: Right Arm)   Pulse 89   Temp 97.8 F (36.6 C) (Oral)   Resp 20   SpO2 96%   Visual Acuity Right Eye Distance:   Left Eye Distance:   Bilateral Distance:    Right Eye Near:   Left Eye Near:    Bilateral Near:     Physical Exam   UC Treatments / Results  Labs (all labs ordered are listed, but only abnormal results are displayed) Labs Reviewed  POCT FASTING CBG [facility] MANUAL ENTRY - Abnormal; Notable for the following components:      Result Value   POCT Glucose ([facility]) 153 (*)    All other components within normal limits    EKG   Radiology No results found.  Procedures Procedures (including critical care time)  Medications Ordered in UC Medications - No data to display  Initial Impression / Assessment and Plan / UC Course  I have reviewed the triage vital signs and the nursing notes.  Pertinent labs & imaging results that were available during my care of the patient were reviewed by me and considered in my medical decision making (see chart for details).  Vitals and triage reviewed, patient is hemodynamically stable.  CBG in clinic in the 150s, no concern for DKA or acute hyperglycemia.  Staff attempted bilateral ear irrigation, patient  requested right ear only.  Some wax removed, significant amount still remains, unable to see tympanic membrane.  Patient did not tolerate cerumen removal by provider.  Educated to start the wax softening drops.  Cough and sore throat consistent with viral upper respiratory tract infection, discussed symptomatic management.  Will refill 30 days of lisinopril, given information for [place] [facility] and encouraged to follow-up to establish a PCP.  Plan of care, follow-up care, emergency and return precautions discussed, no questions at this time.     Final Clinical Impressions(s) / UC Diagnoses   Final diagnoses:  Viral URI with cough  Bilateral impacted cerumen  Essential hypertension     Discharge Instructions      We irrigated your ear and removed some of your earwax today. Please use the wax softening drops to help with the wax.   Your symptoms are consistent with a viral upper respiratory tract infection, please alternate between Tylenol and ibuprofen every 4-6 hours for fever, body aches and discomfort.  For symptomatic management of your sore throat you can sleep with a humidifier, do warm saline gargles and drink tea with warm honey.  Please take the Mucinex, this will help break up your nasal congestion.  Ensure you are drinking at least 64 ounces of water daily with this.  I have refilled your lisinopril for the next month, please follow-up with a primary care provider for further evaluation and routine care.  Please return to clinic for any new or concerning symptoms.      ED Prescriptions     Medication Sig Dispense Auth. Provider   lisinopril (ZESTRIL) 10 MG tablet Take 1 tablet (10 mg total) by mouth daily. 30 tablet Rinaldo Ratel, [place] N, [place]   carbamide peroxide (DEBROX) 6.5 % OTIC solution Place 5 drops into both ears 2 (two) times daily. 15 mL Rinaldo Ratel, [place], FNP   guaiFENesin (MUCINEX) 600 MG 12 hr tablet Take 2 tablets (1,200 mg  total) by mouth 2 (two) times daily for 7 days. 28 tablet Kinta Martis, [place], [place]      PDMP not reviewed this encounter.   Dauna Ziska, [place] N, [place] 01/24/23 1444

## 2023-01-24 NOTE — ED Triage Notes (Signed)
Pt c/o cough and congestion since yesterday. Took tylenol this morning,.

## 2023-01-24 NOTE — Discharge Instructions (Addendum)
We irrigated your ear and removed some of your earwax today. Please use the wax softening drops to help with the wax.   Your symptoms are consistent with a viral upper respiratory tract infection, please alternate between Tylenol and ibuprofen every 4-6 hours for fever, body aches and discomfort.  For symptomatic management of your sore throat you can sleep with a humidifier, do warm saline gargles and drink tea with warm honey.  Please take the Mucinex, this will help break up your nasal congestion.  Ensure you are drinking at least 64 ounces of water daily with this.  I have refilled your lisinopril for the next month, please follow-up with a primary care provider for further evaluation and routine care.  Please return to clinic for any new or concerning symptoms.

## 2023-02-04 ENCOUNTER — Other Ambulatory Visit: Payer: Self-pay

## 2023-02-04 ENCOUNTER — Ambulatory Visit (HOSPITAL_COMMUNITY): Admission: EM | Admit: 2023-02-04 | Discharge: 2023-02-04 | Discharge status: Against Medical Advice | Payer: Self-pay

## 2023-02-04 ENCOUNTER — Encounter (HOSPITAL_COMMUNITY): Payer: Self-pay | Admitting: Emergency Medicine

## 2023-02-04 NOTE — ED Notes (Addendum)
Pt left out of room before provider could assess him.

## 2023-02-04 NOTE — ED Triage Notes (Signed)
Pt states his left ear is having a continues ringing noise and he is not able to hear from it. Denies any pain.

## 2023-02-15 ENCOUNTER — Encounter (INDEPENDENT_AMBULATORY_CARE_PROVIDER_SITE_OTHER): Payer: Self-pay | Admitting: Primary Care

## 2023-02-15 VITALS — BP 142/96 | HR 77 | Resp 16 | Ht 66.0 in | Wt 206.4 lb

## 2023-02-15 DIAGNOSIS — Z5321 Procedure and treatment not carried out due to patient leaving prior to being seen by health care provider: Secondary | ICD-10-CM

## 2023-02-15 NOTE — Progress Notes (Signed)
Patient left.

## 2023-04-05 ENCOUNTER — Encounter (HOSPITAL_COMMUNITY): Payer: Self-pay | Admitting: *Deleted

## 2023-04-05 ENCOUNTER — Other Ambulatory Visit: Payer: Self-pay

## 2023-04-05 ENCOUNTER — Emergency Department (HOSPITAL_COMMUNITY)
Admission: EM | Admit: 2023-04-05 | Discharge: 2023-04-05 | Discharge status: Against Medical Advice | Payer: Self-pay | Attending: Emergency Medicine | Admitting: Emergency Medicine

## 2023-04-05 DIAGNOSIS — H9313 Tinnitus, bilateral: Secondary | ICD-10-CM | POA: Insufficient documentation

## 2023-04-05 DIAGNOSIS — R059 Cough, unspecified: Secondary | ICD-10-CM | POA: Insufficient documentation

## 2023-04-05 DIAGNOSIS — Z5321 Procedure and treatment not carried out due to patient leaving prior to being seen by health care provider: Secondary | ICD-10-CM | POA: Insufficient documentation

## 2023-04-05 NOTE — ED Triage Notes (Addendum)
Pt has been out of his meds for 2 days. Woke up this morning with a cough, went to [facility] for BP check and was 184/103. Denies pain, c/o ringing in his ears. Pt was taking lisinopril; however has new prescription for Norvasc that he hasn't started taking yet

## 2023-04-08 ENCOUNTER — Encounter (HOSPITAL_COMMUNITY): Payer: Self-pay

## 2023-04-08 ENCOUNTER — Ambulatory Visit (HOSPITAL_COMMUNITY)
Admission: EM | Admit: 2023-04-08 | Discharge: 2023-04-08 | Disposition: A | Discharge status: Home / Self Care | Payer: Self-pay | Attending: Family Medicine | Admitting: Family Medicine

## 2023-04-08 DIAGNOSIS — H9203 Otalgia, bilateral: Secondary | ICD-10-CM

## 2023-04-08 DIAGNOSIS — H6123 Impacted cerumen, bilateral: Secondary | ICD-10-CM

## 2023-04-08 MED ORDER — OFLOXACIN 0.3 % OT SOLN: 5.0000 [drp] | Freq: Two times a day (BID) | OTIC | 0 refills | Status: AC

## 2023-04-08 NOTE — ED Provider Notes (Signed)
[facility]    CSN: [id number] Arrival date & time: 04/08/23  1633      History   Chief Complaint Chief Complaint  Patient presents with   Cough   Ear Pain    HPI Wesley [name] is a 57 y.o. male.   Here for trouble hearing out of the left ear especially.  He states this has been going on a long time.  Apparently at some time past has had his ears lavaged.  To me he denies any ear pain.  To triage staff he had noted some ear pain for a week and cough.  To me he tells me, in Spanish that he has not had any cough or fever or congestion.  Also he has not had any ear pain.  Also he notes about 3 to 4 days of dysuria.  He has maybe had a little white material come out when he urinates.  No fever or chills or vomiting His allergy list includes ibuprofen, steroids, and Zofran.   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 Authorizing Provider  lisinopril (ZESTRIL) 10 MG tablet Take 1 tablet (10 mg total) by mouth daily. 01/24/23  Yes Rinaldo Ratel, [place], FNP  ofloxacin (FLOXIN) 0.3 % OTIC solution Place 5 drops into both ears 2 (two) times daily for 7  days. 04/08/23 04/15/23 Yes Zenia Resides, MD  albuterol (VENTOLIN HFA) 108 (90 Base) MCG/ACT inhaler Inhale 1-2 puffs into the lungs every 6 (six) hours as needed for wheezing or shortness of breath. 05/03/22   Carlisle Beers, FNP  carbamide peroxide (DEBROX) 6.5 % OTIC solution Place 5 drops into both ears 2 (two) times daily. 01/24/23   Garrison, [place] N, FNP  meloxicam (MOBIC) 15 MG tablet Take 1 tablet (15 mg total) by mouth daily. 12/11/22   Raspet, Noberto Retort, PA-C  pantoprazole (PROTONIX) 40 MG tablet Take 1 tablet (40 mg total) by mouth daily. 06/26/22   Rising, Lurena Joiner, PA-C  esomeprazole (NEXIUM) 20 MG capsule Take 20 mg by mouth daily. 02/24/20 10/22/20  [provider]  famotidine (PEPCID) 20 MG tablet Take 1 tablet (20 mg total) by mouth 2 (two) times daily. 03/07/20 10/22/20  Wallis Bamberg, PA-C  sucralfate (CARAFATE) 1 GM/10ML suspension Take 10 mLs (1 g total) by mouth 4 (four) times daily -  with meals and at bedtime. 12/22/18 03/02/19  Emi Holes, PA-C    Family History Family History  Problem Relation Age of Onset   Hypertension Mother  Hypertension Father    Colon cancer Neg Hx    Colon polyps Neg Hx    Esophageal cancer Neg Hx    Rectal cancer Neg Hx    Stomach cancer Neg Hx     Social History Social History   Tobacco Use   Smoking status: Former   Smokeless tobacco: Never  Vaping Use   Vaping status: Never Used  Substance Use Topics   Alcohol use: Yes    Alcohol/week: 8.0 standard drinks of alcohol    Types: 8 Cans of beer per week    Comment: socially   Drug use: Not Currently     Allergies   Ibuprofen, Other, and Zofran [ondansetron hcl]   Review of Systems Review of Systems  Respiratory:  Positive for cough.      Physical Exam Triage Vital Signs ED Triage Vitals [04/08/23 1743]  Encounter Vitals Group     BP (!) 141/91     Systolic BP Percentile      Diastolic BP Percentile      Pulse Rate 86     Resp 16     Temp 98.1 F  (36.7 C)     Temp Source Oral     SpO2 97 %     Weight      Height      Head Circumference      Peak Flow      Pain Score      Pain Loc      Pain Education      Exclude from Growth Chart    No data found.  Updated Vital Signs BP (!) 141/91 (BP Location: Left Arm)   Pulse 86   Temp 98.1 F (36.7 C) (Oral)   Resp 16   SpO2 97%   Visual Acuity Right Eye Distance:   Left Eye Distance:   Bilateral Distance:    Right Eye Near:   Left Eye Near:    Bilateral Near:     Physical Exam Vitals reviewed.  Constitutional:      General: He is not in acute distress.    Appearance: He is not toxic-appearing.  HENT:     Left Ear: Ear canal normal.     Ears:     Comments: Cerumen is obscuring the view of the tympanic membranes bilaterally.  Ear canals are normal and there is no pain on traction of the pinnae    Nose: Nose normal.     Mouth/Throat:     Mouth: Mucous membranes are moist.     Pharynx: No oropharyngeal exudate or posterior oropharyngeal erythema.  Eyes:     Extraocular Movements: Extraocular movements intact.     Conjunctiva/sclera: Conjunctivae normal.     Pupils: Pupils are equal, round, and reactive to light.  Cardiovascular:     Rate and Rhythm: Normal rate and regular rhythm.     Heart sounds: No murmur heard. Pulmonary:     Effort: Pulmonary effort is normal.     Breath sounds: Normal breath sounds.  Musculoskeletal:     Cervical back: Neck supple.  Lymphadenopathy:     Cervical: No cervical adenopathy.  Skin:    Capillary Refill: Capillary refill takes less than 2 seconds.     Coloration: Skin is not jaundiced or pale.  Neurological:     General: No focal deficit present.     Mental Status: He is alert and oriented to person, place, and time.  Psychiatric:        Behavior:  Behavior normal.      UC Treatments / Results  Labs (all labs ordered are listed, but only abnormal results are displayed) Labs Reviewed  CYTOLOGY, (ORAL, ANAL, URETHRAL)  ANCILLARY ONLY    EKG   Radiology No results found.  Procedures Procedures (including critical care time)  Medications Ordered in UC Medications - No data to display  Initial Impression / Assessment and Plan / UC Course  I have reviewed the triage vital signs and the nursing notes.  Pertinent labs & imaging results that were available during my care of the patient were reviewed by me and considered in my medical decision making (see chart for details).        Patient did not tolerate ear lavage by staff.  On reexamination there is still a good bit of wax bilaterally.  Since it was hurting him during the lavage process, I am going to send in Floxin eardrops.  When I went in to reexamine him he then states he has numbness in his left lower leg for the last month.  No injury or trauma.  I therefore advised him that he needs to see primary care.  Staff will assist him in making a primary care appointment Final Clinical Impressions(s) / UC Diagnoses   Final diagnoses:  Acute ear pain, bilateral  Bilateral impacted cerumen     Discharge Instructions      -Floxin eardrops-5 drops in the affected ear 2 times daily for 7 days.       ED Prescriptions     Medication Sig Dispense Auth. Provider   ofloxacin (FLOXIN) 0.3 % OTIC solution Place 5 drops into both ears 2 (two) times daily for 7 days. 5 mL Zenia Resides, MD      PDMP not reviewed this encounter.   Zenia Resides, MD 04/08/23 [phone]

## 2023-04-08 NOTE — ED Triage Notes (Signed)
Pt presents with bilateral ear pain and pressure x 1 week. Pt reports a cough.

## 2023-04-08 NOTE — Discharge Instructions (Addendum)
-  Floxin eardrops-5 drops in the affected ear 2 times daily for 7 days.

## 2023-04-11 ENCOUNTER — Telehealth (HOSPITAL_COMMUNITY): Payer: Self-pay | Admitting: Emergency Medicine

## 2023-04-11 NOTE — Telephone Encounter (Signed)
Per protocol, patient will need treatment with IM Rocephin 500mg  for positive Gonorrhea.   Contacted patient by phone.  Verified identity using two identifiers.  Provided positive result.  Reviewed safe sex practices, notifying partners, and refraining from sexual activities for 7 days from time of treatment.  Patient verified understanding, all questions answered.

## 2023-04-13 ENCOUNTER — Encounter (HOSPITAL_COMMUNITY): Payer: Self-pay

## 2023-04-13 ENCOUNTER — Ambulatory Visit (HOSPITAL_COMMUNITY)
Admission: EM | Admit: 2023-04-13 | Discharge: 2023-04-13 | Disposition: A | Discharge status: Home / Self Care | Payer: Self-pay | Attending: Internal Medicine | Admitting: Internal Medicine

## 2023-04-13 DIAGNOSIS — A549 Gonococcal infection, unspecified: Secondary | ICD-10-CM

## 2023-04-13 MED ORDER — LIDOCAINE HCL (PF) 1 % IJ SOLN
INTRAMUSCULAR | Status: AC
  Filled 2023-04-13: qty 2

## 2023-04-13 MED ORDER — CEFTRIAXONE SODIUM 500 MG IJ SOLR
INTRAMUSCULAR | Status: AC
  Filled 2023-04-13: qty 500

## 2023-04-13 MED ORDER — CEFTRIAXONE SODIUM 1 G IJ SOLR
500.0000 mg | Freq: Once | INTRAMUSCULAR | Status: AC
  Administered 2023-04-13: 500 mg via INTRAMUSCULAR

## 2023-04-13 NOTE — ED Triage Notes (Signed)
Patient here today to have Rocephin injection after being diagnosed with Gonorrhea.

## 2023-04-30 ENCOUNTER — Telehealth: Payer: Self-pay

## 2023-04-30 ENCOUNTER — Ambulatory Visit: Payer: Self-pay | Admitting: Internal Medicine

## 2023-04-30 NOTE — Telephone Encounter (Signed)
Pt was a no show for a NP appt Wesley [name], I dismissed and did not send a letter.

## 2023-05-01 NOTE — Telephone Encounter (Signed)
Pt currently has 3 charts marked for merge, dismissed from [facility] [place] in all 3 charts.

## 2023-05-15 ENCOUNTER — Ambulatory Visit (HOSPITAL_COMMUNITY)
Admission: EM | Admit: 2023-05-15 | Discharge: 2023-05-15 | Discharge status: Against Medical Advice | Payer: Self-pay | Attending: Family Medicine | Admitting: Family Medicine

## 2023-05-15 ENCOUNTER — Encounter (HOSPITAL_COMMUNITY): Payer: Self-pay

## 2023-05-15 DIAGNOSIS — Z5321 Procedure and treatment not carried out due to patient leaving prior to being seen by health care provider: Secondary | ICD-10-CM

## 2023-05-15 NOTE — ED Triage Notes (Signed)
Pt states that he has some left leg pain. X2-3 days Pt states that his leg feels numb.

## 2023-05-15 NOTE — ED Provider Notes (Signed)
Patient not evaluated by me. Evaluated by PA student and then left without being seen by a provider. No testing performed    Wesley [name], Wesley Joiner, PA-C 05/15/23 1408

## 2023-05-17 ENCOUNTER — Ambulatory Visit (HOSPITAL_COMMUNITY)
Admission: EM | Admit: 2023-05-17 | Discharge: 2023-05-17 | Disposition: A | Discharge status: Home / Self Care | Payer: Self-pay | Attending: Emergency Medicine | Admitting: Emergency Medicine

## 2023-05-17 ENCOUNTER — Encounter (HOSPITAL_COMMUNITY): Payer: Self-pay

## 2023-05-17 DIAGNOSIS — M5432 Sciatica, left side: Secondary | ICD-10-CM

## 2023-05-17 LAB — POCT FASTING CBG KUC MANUAL ENTRY: POCT Glucose (KUC): 150 mg/dL — AB (ref 70–99)

## 2023-05-17 MED ORDER — CYCLOBENZAPRINE HCL 5 MG PO TABS: 5.0000 mg | ORAL_TABLET | Freq: Two times a day (BID) | ORAL | 0 refills | Status: DC | PRN

## 2023-05-17 NOTE — Discharge Instructions (Signed)
Take Flexeril as needed for numbness and tingling. Please follow-up with your primary care provider about management of your blood pressure and prediabetes. Return here for any new concerns.

## 2023-05-17 NOTE — ED Triage Notes (Signed)
Numbness in the left lower leg onset 7 days ago, worse today. Patient has history of prediabetes. No known falls or injuries. Has never had this happen before.

## 2023-05-17 NOTE — ED Provider Notes (Signed)
[facility]    CSN: [id number] Arrival date & time: 05/17/23  1600      History   Chief Complaint Chief Complaint  Patient presents with   Numbness    Left leg.     HPI Wesley [name] is a 57 y.o. male.   Patient presents with numbness and tingling to left leg x 10 days.  Denies pain, weakness, numbness and tingling to other extremities, confusion, blurred vision, and trouble walking. Denies any injury.      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 Authorizing Provider  lisinopril (ZESTRIL) 10 MG tablet Take 1 tablet (10 mg total) by mouth daily. 01/24/23  Yes Rinaldo Ratel, [place] N, FNP  cyclobenzaprine (FLEXERIL) 5 MG tablet Take 1 tablet (5 mg total) by mouth 2 (two) times daily as needed for muscle spasms. 05/17/23  Yes Susann Givens, Roosevelt Bisher A, NP  esomeprazole (NEXIUM) 20 MG capsule Take 20 mg by mouth daily. 02/24/20 10/22/20  [provider]  famotidine (PEPCID) 20 MG tablet Take 1 tablet (20 mg total) by mouth 2 (two) times daily. 03/07/20 10/22/20  Wallis Bamberg, PA-C  sucralfate (CARAFATE) 1 GM/10ML suspension Take 10 mLs (1 g total) by mouth 4  (four) times daily -  with meals and at bedtime. 12/22/18 03/02/19  Emi Holes, PA-C    Family History Family History  Problem Relation Age of Onset   Hypertension Mother    Hypertension Father    Colon cancer Neg Hx    Colon polyps Neg Hx    Esophageal cancer Neg Hx    Rectal cancer Neg Hx    Stomach cancer Neg Hx     Social History Social History   Tobacco Use   Smoking status: Former   Smokeless tobacco: Never  Vaping Use   Vaping status: Never Used  Substance Use Topics   Alcohol use: Not Currently    Alcohol/week: 8.0 standard drinks of alcohol    Types: 8 Cans of beer per week    Comment: socially   Drug use: Not Currently     Allergies   Ibuprofen, Other, and Zofran [ondansetron hcl]   Review of Systems Review of Systems  Constitutional:  Negative for activity change and fatigue.  Respiratory:  Negative for shortness of breath.   Cardiovascular:  Negative for chest pain.  Musculoskeletal:  Negative for back pain, gait problem and joint swelling.  Skin:  Negative for color change.  Neurological:  Positive for numbness. Negative for dizziness, tremors, syncope, facial asymmetry,  speech difficulty, weakness, light-headedness and headaches.     Physical Exam Triage Vital Signs ED Triage Vitals  Encounter Vitals Group     BP 05/17/23 1703 136/84     Systolic BP Percentile --      Diastolic BP Percentile --      Pulse Rate 05/17/23 1703 88     Resp 05/17/23 1703 16     Temp 05/17/23 1703 97.8 F (36.6 C)     Temp Source 05/17/23 1703 Oral     SpO2 05/17/23 1703 95 %     Weight 05/17/23 1703 199 lb 15.3 oz (90.7 kg)     Height 05/17/23 1703 5\' 6"  (1.676 m)     Head Circumference --      Peak Flow --      Pain Score 05/17/23 1702 5     Pain Loc --      Pain Education --      Exclude from Growth Chart --    No data found.  Updated Vital Signs BP 136/84 (BP Location: Right Arm)   Pulse 88   Temp 97.8 F (36.6 C) (Oral)   Resp 16   Ht 5\' 6"   (1.676 m)   Wt 199 lb 15.3 oz (90.7 kg)   SpO2 95%   BMI 32.27 kg/m   Visual Acuity Right Eye Distance:   Left Eye Distance:   Bilateral Distance:    Right Eye Near:   Left Eye Near:    Bilateral Near:     Physical Exam Vitals and nursing note reviewed.  Constitutional:      General: He is awake. He is not in acute distress.    Appearance: Normal appearance. He is well-developed. He is not ill-appearing, toxic-appearing or diaphoretic.  Eyes:     Extraocular Movements: Extraocular movements intact.     Pupils: Pupils are equal, round, and reactive to light.  Pulmonary:     Effort: Pulmonary effort is normal. No respiratory distress.  Musculoskeletal:        General: No swelling or tenderness. Normal range of motion.     Cervical back: Normal range of motion.     Right lower leg: Normal. No swelling, deformity or tenderness.     Left lower leg: Normal. No swelling, deformity or tenderness.     Right ankle: Normal. No swelling or deformity. No tenderness. Normal range of motion.     Left ankle: Normal. No swelling or deformity. No tenderness. Normal range of motion.     Right foot: Normal. Normal range of motion. No swelling, deformity or tenderness. Normal pulse.     Left foot: Normal. Normal range of motion. No swelling, deformity or tenderness. Normal pulse.  Feet:     Right foot:     Skin integrity: Skin integrity normal.     Left foot:     Skin integrity: Skin integrity normal.  Skin:    General: Skin is warm and dry.  Neurological:     General: No focal deficit present.     Mental Status: He is alert and oriented to person, place, and time.     GCS: GCS eye subscore is 4. GCS verbal subscore is 5. GCS motor subscore is 6.     Cranial Nerves: Cranial nerves 2-12 are intact.     Sensory: Sensory deficit present.     Motor: Motor function is intact.     Coordination: Coordination is intact.     Gait: Gait is intact.  Comments: Reports decreased sensation to left  lower leg and foot.   Psychiatric:        Attention and Perception: Attention and perception normal.        Mood and Affect: Mood and affect normal.        Speech: Speech normal.        Behavior: Behavior normal. Behavior is cooperative.        Thought Content: Thought content normal.        Cognition and Memory: Cognition normal.        Judgment: Judgment normal.      UC Treatments / Results  Labs (all labs ordered are listed, but only abnormal results are displayed) Labs Reviewed  POCT FASTING CBG [facility] MANUAL ENTRY - Abnormal; Notable for the following components:      Result Value   POCT Glucose ([facility]) 150 (*)    All other components within normal limits    EKG   Radiology No results found.  Procedures Procedures (including critical care time)  Medications Ordered in UC Medications - No data to display  Initial Impression / Assessment and Plan / UC Course  I have reviewed the triage vital signs and the nursing notes.  Pertinent labs & imaging results that were available during my care of the patient were reviewed by me and considered in my medical decision making (see chart for details).     Patient presents with numbness and tingling to left leg x 10 days.  Denies pain, weakness, numbness and tingling to other extremities, confusion, blurred vision, and trouble walking. Denies any known injury. Requesting that we check his blood glucose. POC CBG ordered. Patient also requesting refill of his lisinopril but also has concerns about whether or not it is working. Blood pressure is 136/84 here in clinic. Discussed following up with PCP regarding blood sugar and blood pressure concerns. Upon assessment patient reports decreased sensation in left lower leg and foot. No other neuro deficits to note. Prescribed Flexeril to take as needed for sciatica. Offered a steroid injection here in the clinic but patient declined. Discussed return precautions. Final Clinical Impressions(s) /  UC Diagnoses   Final diagnoses:  Sciatica of left side without back pain     Discharge Instructions      Take Flexeril as needed for numbness and tingling. Please follow-up with your primary care provider about management of your blood pressure and prediabetes. Return here for any new concerns.    ED Prescriptions     Medication Sig Dispense Auth. Provider   cyclobenzaprine (FLEXERIL) 5 MG tablet Take 1 tablet (5 mg total) by mouth 2 (two) times daily as needed for muscle spasms. 10 tablet Wynonia Lawman A, NP      PDMP not reviewed this encounter.   Wynonia Lawman A, NP 05/17/23 [phone]

## 2023-07-22 ENCOUNTER — Ambulatory Visit (HOSPITAL_COMMUNITY)
Admission: EM | Admit: 2023-07-22 | Discharge: 2023-07-22 | Disposition: A | Discharge status: Home / Self Care | Payer: 59

## 2023-07-22 ENCOUNTER — Encounter (HOSPITAL_COMMUNITY): Payer: Self-pay

## 2023-07-22 DIAGNOSIS — Z760 Encounter for issue of repeat prescription: Secondary | ICD-10-CM

## 2023-07-22 DIAGNOSIS — J069 Acute upper respiratory infection, unspecified: Secondary | ICD-10-CM

## 2023-07-22 MED ORDER — PROMETHAZINE-DM 6.25-15 MG/5ML PO SYRP: 5.0000 mL | ORAL_SOLUTION | Freq: Four times a day (QID) | ORAL | 0 refills | Status: DC | PRN

## 2023-07-22 MED ORDER — AMLODIPINE BESYLATE 10 MG PO TABS: 10.0000 mg | ORAL_TABLET | Freq: Every day | ORAL | 1 refills | Status: DC

## 2023-07-22 MED ORDER — LISINOPRIL 10 MG PO TABS: 10.0000 mg | ORAL_TABLET | Freq: Every day | ORAL | 1 refills | Status: DC

## 2023-07-22 NOTE — ED Triage Notes (Signed)
Patient here today with c/o productive cough and runny nose X 3 days. He has been a little SOB today. He has been taking Robitussin and cough drops with relief. Denise fever. No sick contacts.

## 2023-07-22 NOTE — Discharge Instructions (Addendum)
Likely viral upper respiratory infection. This does not need an antibiotic. Can Treat with the following:  Promethazine-DM 5 mLs every 6 hours as needed for cough. This can make you sleepy.  Refills sent of Norvasc and Lisinopril. Keep follow up with PCP in 2 weeks. Can try over the counter zyrtec or Claritin or allegra. This can help reduce the symptoms. Can try Mucinex for nasal congestion. Return to urgent care or PCP if symptoms worsen or fail to resolve.

## 2023-07-22 NOTE — ED Provider Notes (Signed)
[facility]    CSN: [id number] Arrival date & time: 07/22/23  1021      History   Chief Complaint Chief Complaint  Patient presents with   Cough    HPI Wesley [name] is a 57 y.o. male.   57 year old male who presents to urgent care with complaints of   Cough Associated symptoms: no chest pain, no chills, no ear pain, no fever, no rash, no shortness of breath and no sore throat     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 Authorizing Provider  promethazine-dextromethorphan (PROMETHAZINE-DM) 6.25-15 MG/5ML syrup Take 5 mLs by mouth 4 (four) times daily as needed for cough. 07/22/23  Yes Jamin Panther A, PA-C  amLODipine (NORVASC) 10 MG tablet Take 1 tablet (10 mg total) by mouth daily. 07/22/23   Natesha Hassey A, PA-C  lisinopril (ZESTRIL) 10 MG tablet Take 1 tablet (10 mg total) by mouth daily. 07/22/23   Ksenia Kunz A, PA-C  esomeprazole (NEXIUM) 20 MG capsule Take 20 mg by mouth daily. 02/24/20 10/22/20  [provider]  famotidine (PEPCID) 20 MG tablet Take 1 tablet (20 mg total) by mouth 2 (two) times daily. 03/07/20 10/22/20   Wallis Bamberg, PA-C  sucralfate (CARAFATE) 1 GM/10ML suspension Take 10 mLs (1 g total) by mouth 4 (four) times daily -  with meals and at bedtime. 12/22/18 03/02/19  Emi Holes, PA-C    Family History Family History  Problem Relation Age of Onset   Hypertension Mother    Hypertension Father    Colon cancer Neg Hx    Colon polyps Neg Hx    Esophageal cancer Neg Hx    Rectal cancer Neg Hx    Stomach cancer Neg Hx     Social History Social History   Tobacco Use   Smoking status: Former   Smokeless tobacco: Never  Vaping Use   Vaping status: Never Used  Substance Use Topics   Alcohol use: Yes    Alcohol/week: 8.0 standard drinks of alcohol    Types: 8 Cans of beer per week    Comment: socially   Drug use: Not Currently     Allergies   Ibuprofen, Other, and Zofran [ondansetron hcl]   Review of Systems Review of Systems  Constitutional:  Negative for chills and fever.  HENT:  Positive for congestion. Negative for ear pain and sore throat.   Eyes:  Negative for pain and visual disturbance.  Respiratory:  Positive for cough. Negative for shortness of breath.  Cardiovascular:  Negative for chest pain and palpitations.  Gastrointestinal:  Negative for abdominal pain and vomiting.  Genitourinary:  Negative for dysuria and hematuria.  Musculoskeletal:  Negative for arthralgias and back pain.  Skin:  Negative for color change and rash.  Neurological:  Negative for seizures and syncope.  All other systems reviewed and are negative.    Physical Exam Triage Vital Signs ED Triage Vitals [07/22/23 1138]  Encounter Vitals Group     BP (!) 149/95     Systolic BP Percentile      Diastolic BP Percentile      Pulse Rate 87     Resp 16     Temp (!) 97.3 F (36.3 C)     Temp Source Oral     SpO2 96 %     Weight 200 lb (90.7 kg)     Height 5\' 4"  (1.626 m)     Head Circumference      Peak Flow      Pain Score 0     Pain Loc      Pain Education      Exclude from  Growth Chart    No data found.  Updated Vital Signs BP (!) 149/95 (BP Location: Left Arm)   Pulse 87   Temp (!) 97.3 F (36.3 C) (Oral)   Resp 16   Ht 5\' 4"  (1.626 m)   Wt 200 lb (90.7 kg)   SpO2 96%   BMI 34.33 kg/m   Visual Acuity Right Eye Distance:   Left Eye Distance:   Bilateral Distance:    Right Eye Near:   Left Eye Near:    Bilateral Near:     Physical Exam Vitals and nursing note reviewed.  Constitutional:      General: He is not in acute distress.    Appearance: He is well-developed.  HENT:     Head: Normocephalic and atraumatic.     Nose: Congestion present.     Mouth/Throat:     Mouth: Mucous membranes are dry.     Pharynx: No oropharyngeal exudate or posterior oropharyngeal erythema.     Comments: Post-nasal drainage Eyes:     Conjunctiva/sclera: Conjunctivae normal.  Cardiovascular:     Rate and Rhythm: Normal rate and regular rhythm.     Heart sounds: No murmur heard. Pulmonary:     Effort: Pulmonary effort is normal. No respiratory distress.     Breath sounds: Normal breath sounds.  Abdominal:     Palpations: Abdomen is soft.     Tenderness: There is no abdominal tenderness.  Musculoskeletal:        General: No swelling.     Cervical back: Neck supple.  Skin:    General: Skin is warm and dry.     Capillary Refill: Capillary refill takes less than 2 seconds.  Neurological:     General: No focal deficit present.     Mental Status: He is alert.  Psychiatric:        Mood and Affect: Mood normal.      UC Treatments / Results  Labs (all labs ordered are listed, but only abnormal results are displayed) Labs Reviewed - No data to display  EKG   Radiology No results found.  Procedures Procedures (including critical care time)  Medications Ordered in UC Medications - No data to display  Initial Impression / Assessment and Plan / UC Course  I have reviewed the triage vital signs and the nursing notes.  Pertinent labs &  imaging  results that were available during my care of the patient were reviewed by me and considered in my medical decision making (see chart for details).     Viral URI with cough  Encounter for medication refill   Likely viral upper respiratory infection. This does not need an antibiotic. Can Treat with the following:  Promethazine-DM 5 mLs every 6 hours as needed for cough. This can make you sleepy.  Refills sent of Norvasc and Lisinopril. Keep follow up with PCP in 2 weeks. Can try over the counter zyrtec or Claritin or allegra. This can help reduce the symptoms. Can try Mucinex for nasal congestion. Return to urgent care or PCP if symptoms worsen or fail to resolve.    Final Clinical Impressions(s) / UC Diagnoses   Final diagnoses:  Viral URI with cough  Encounter for medication refill     Discharge Instructions      Likely viral upper respiratory infection. This does not need an antibiotic. Can Treat with the following:  Promethazine-DM 5 mLs every 6 hours as needed for cough. This can make you sleepy.  Refills sent of Norvasc and Lisinopril. Keep follow up with PCP in 2 weeks. Can try over the counter zyrtec or Claritin or allegra. This can help reduce the symptoms. Can try Mucinex for nasal congestion. Return to urgent care or PCP if symptoms worsen or fail to resolve.       ED Prescriptions     Medication Sig Dispense Auth. Provider   amLODipine (NORVASC) 10 MG tablet Take 1 tablet (10 mg total) by mouth daily. 30 tablet Sherrilynn Gudgel A, PA-C   lisinopril (ZESTRIL) 10 MG tablet Take 1 tablet (10 mg total) by mouth daily. 30 tablet Landis Martins, PA-C   promethazine-dextromethorphan (PROMETHAZINE-DM) 6.25-15 MG/5ML syrup Take 5 mLs by mouth 4 (four) times daily as needed for cough. 118 mL Landis Martins, [place]      PDMP not reviewed this encounter.   Landis Martins, [place] 07/22/23 1215

## 2023-08-12 ENCOUNTER — Ambulatory Visit (HOSPITAL_COMMUNITY)
Admission: EM | Admit: 2023-08-12 | Discharge: 2023-08-12 | Disposition: A | Discharge status: Home / Self Care | Payer: 59 | Attending: Emergency Medicine | Admitting: Emergency Medicine

## 2023-08-12 ENCOUNTER — Encounter (HOSPITAL_COMMUNITY): Payer: Self-pay | Admitting: Emergency Medicine

## 2023-08-12 DIAGNOSIS — H6123 Impacted cerumen, bilateral: Secondary | ICD-10-CM | POA: Diagnosis not present

## 2023-08-12 DIAGNOSIS — R739 Hyperglycemia, unspecified: Secondary | ICD-10-CM

## 2023-08-12 LAB — POCT FASTING CBG KUC MANUAL ENTRY: POCT Glucose (KUC): 149 mg/dL — AB (ref 70–99)

## 2023-08-12 MED ORDER — CARBAMIDE PEROXIDE 6.5 % OT SOLN: 5.0000 [drp] | Freq: Two times a day (BID) | OTIC | 0 refills | Status: DC

## 2023-08-12 NOTE — ED Triage Notes (Signed)
Pt c/o left ear fullness for 3 days. States he cannot hear He was seen on 07/22/23 for cold symptoms.

## 2023-08-12 NOTE — Discharge Instructions (Addendum)
Use the Debrox drops twice daily to help loosen up your earwax.  You can return to clinic or follow-up with the ear nose and throat provider in a few days as we can continue to try and remove the earwax that is blocking her ear.  Your blood sugar was elevated in clinic, but not critically so.  Please follow-up with a primary care provider regarding further evaluation and management of blood sugar elevations and chronic conditions.

## 2023-08-12 NOTE — ED Provider Notes (Signed)
[facility]    CSN: [id number] Arrival date & time: 08/12/23  1157      History   Chief Complaint Chief Complaint  Patient presents with   Ear Fullness    HPI Wesley [name] is a 57 y.o. male.   Patient presents to clinic for left ear fullness over the past 3 to 4 days.  He is unable to hear well out of this ear.  He denies any pain.  He was seen last month for a cold.  Reports a dry cough remains. No chest pain, fevers or shortness of breath.   Would like [place] to check his blood sugar.  Reports he is diabetic.  Endorses frequent urination.  He does not have a primary care provider.  The history is provided by the patient and medical records.  Ear Fullness    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 Authorizing Provider  carbamide peroxide (DEBROX) 6.5 % OTIC solution Place 5 drops into both ears 2 (two) times daily. 08/12/23  Yes Rinaldo Ratel, [place] N, FNP  amLODipine (NORVASC) 10 MG tablet Take 1 tablet (10 mg total) by mouth daily. 07/22/23   White, Elizabeth A, PA-C  lisinopril (ZESTRIL) 10 MG tablet Take 1 tablet (10 mg total) by mouth daily.  07/22/23   White, Elizabeth A, PA-C  promethazine-dextromethorphan (PROMETHAZINE-DM) 6.25-15 MG/5ML syrup Take 5 mLs by mouth 4 (four) times daily as needed for cough. 07/22/23   White, Elizabeth A, PA-C  esomeprazole (NEXIUM) 20 MG capsule Take 20 mg by mouth daily. 02/24/20 10/22/20  [provider]  famotidine (PEPCID) 20 MG tablet Take 1 tablet (20 mg total) by mouth 2 (two) times daily. 03/07/20 10/22/20  Wallis Bamberg, PA-C  sucralfate (CARAFATE) 1 GM/10ML suspension Take 10 mLs (1 g total) by mouth 4 (four) times daily -  with meals and at bedtime. 12/22/18 03/02/19  Emi Holes, PA-C    Family History Family History  Problem Relation Age of Onset   Hypertension Mother    Hypertension Father    Colon cancer Neg Hx    Colon polyps Neg Hx    Esophageal cancer Neg Hx    Rectal cancer Neg Hx    Stomach cancer Neg Hx     Social History Social History   Tobacco Use   Smoking status: Former   Smokeless tobacco: Never  Vaping Use   Vaping status: Never Used  Substance Use Topics   Alcohol use: Yes    Alcohol/week: 8.0 standard drinks of  alcohol    Types: 8 Cans of beer per week    Comment: socially   Drug use: Not Currently     Allergies   Ibuprofen, Other, and Zofran [ondansetron hcl]   Review of Systems Review of Systems  Per HPI   Physical Exam Triage Vital Signs ED Triage Vitals  Encounter Vitals Group     BP 08/12/23 1257 124/85     Systolic BP Percentile --      Diastolic BP Percentile --      Pulse Rate 08/12/23 1257 94     Resp 08/12/23 1257 16     Temp 08/12/23 1257 (!) 97.5 F (36.4 C)     Temp Source 08/12/23 1257 Oral     SpO2 08/12/23 1257 95 %     Weight --      Height --      Head Circumference --      Peak Flow --      Pain Score 08/12/23 1301 0     Pain Loc --      Pain Education --      Exclude from Growth Chart --    No data found.  Updated Vital Signs BP 124/85 (BP Location: Right Arm)   Pulse 94   Temp (!) 97.5 F  (36.4 C) (Oral)   Resp 16   SpO2 95%   Visual Acuity Right Eye Distance:   Left Eye Distance:   Bilateral Distance:    Right Eye Near:   Left Eye Near:    Bilateral Near:     Physical Exam Vitals and nursing note reviewed.  Constitutional:      Appearance: Normal appearance.  HENT:     Head: Normocephalic and atraumatic.     Right Ear: There is impacted cerumen.     Left Ear: There is impacted cerumen.     Nose: Nose normal.     Mouth/Throat:     Mouth: Mucous membranes are moist.  Eyes:     Conjunctiva/sclera: Conjunctivae normal.  Cardiovascular:     Rate and Rhythm: Normal rate.  Pulmonary:     Effort: Pulmonary effort is normal. No respiratory distress.  Musculoskeletal:        General: Normal range of motion.  Skin:    General: Skin is warm and dry.  Neurological:     General: No focal deficit present.     Mental Status: He is alert and oriented to person, place, and time.  Psychiatric:        Mood and Affect: Mood normal.        Behavior: Behavior normal.      UC Treatments / Results  Labs (all labs ordered are listed, but only abnormal results are displayed) Labs Reviewed  POCT FASTING CBG [facility] MANUAL ENTRY - Abnormal; Notable for the following components:      Result Value   POCT Glucose ([facility]) 149 (*)    All other components within normal limits    EKG   Radiology No results found.  Procedures Procedures (including critical care time)  Medications Ordered in UC Medications - No data to display  Initial Impression / Assessment and Plan / UC Course  I have reviewed the triage vital signs and the nursing notes.  Pertinent labs & imaging results that were available during my care of the patient were reviewed by me and considered in my medical decision making (see chart for details).  Vitals and triage reviewed, patient is hemodynamically stable.  Bilateral cerumen impaction, remains after staff attempted irrigation.  Provider attempted  irrigation, patient unable to tolerate.  Advised Debrox drops and to return to clinic or follow-up with the ENT.  Patient requested CBG monitoring, reports history of diabetes.  I see note referencing prediabetes.  CBG under 150, low concern for DKA or acute concerns.  Encouraged to follow-up with her PCP regarding ongoing chronic conditions.  Plan of care, follow-up care return precautions given, no questions at this time.    Final Clinical Impressions(s) / UC Diagnoses   Final diagnoses:  Bilateral impacted cerumen  Hyperglycemia     Discharge Instructions      Use the Debrox drops twice daily to help loosen up your earwax.  You can return to clinic or follow-up with the ear nose and throat provider in a few days as we can continue to try and remove the earwax that is blocking her ear.  Your blood sugar was elevated in clinic, but not critically so.  Please follow-up with a primary care provider regarding further evaluation and management of blood sugar elevations and chronic conditions.      ED Prescriptions     Medication Sig Dispense Auth. Provider   carbamide peroxide (DEBROX) 6.5 % OTIC solution Place 5 drops into both ears 2 (two) times daily. 15 mL Senaida Chilcote, [place] N, FNP      PDMP not reviewed this encounter.   Anquan Azzarello, [place] N, [place] 08/12/23 [phone]

## 2023-08-22 ENCOUNTER — Other Ambulatory Visit: Payer: Self-pay

## 2023-08-22 ENCOUNTER — Ambulatory Visit (HOSPITAL_COMMUNITY)
Admission: EM | Admit: 2023-08-22 | Discharge: 2023-08-22 | Disposition: A | Discharge status: Home / Self Care | Payer: 59 | Attending: Family Medicine | Admitting: Family Medicine

## 2023-08-22 DIAGNOSIS — H6121 Impacted cerumen, right ear: Secondary | ICD-10-CM

## 2023-08-22 DIAGNOSIS — H6122 Impacted cerumen, left ear: Secondary | ICD-10-CM

## 2023-08-22 NOTE — Discharge Instructions (Addendum)
You are being referred to an ear nose and throat specialist.  Their office will call you to schedule an appointment.  Tylenol or ibuprofen as needed for pain.

## 2023-08-22 NOTE — ED Triage Notes (Signed)
Patient arrives with complaints of left ear problem. Reports that his ear has been hurting as well as having some earlier loss. Tried ear drops with minimal relief.

## 2023-08-22 NOTE — ED Provider Notes (Signed)
[facility]    CSN: [id number] Arrival date & time: 08/22/23  1052      History   Chief Complaint Chief Complaint  Patient presents with   Hearing Problem    Left ear    HPI Wesley [name] is a 57 y.o. male.   HPI Patient presents today for evaluation of left ear cerumen impaction and loss of hearing.  Patient seen here in clinic 10 days ago staff along with provider attempted ear irrigation which was unsuccessful in extracting any cerumen.  Patient was sent home with Debrox drops and reports has used drops consistently but now complains of pain in addition into inability to hear.  He is returning today for repeat ear irrigation. 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 Authorizing Provider  amLODipine (NORVASC) 10 MG tablet Take 1 tablet (10 mg total) by mouth daily. 07/22/23   White, Elizabeth A, PA-C  carbamide peroxide (DEBROX) 6.5 % OTIC solution Place 5 drops into both ears 2 (two) times daily. 08/12/23   Garrison, [place] N, FNP  lisinopril (ZESTRIL) 10 MG tablet Take 1 tablet (10 mg total) by mouth daily. 07/22/23   White, Elizabeth A, PA-C   promethazine-dextromethorphan (PROMETHAZINE-DM) 6.25-15 MG/5ML syrup Take 5 mLs by mouth 4 (four) times daily as needed for cough. 07/22/23   White, Elizabeth A, PA-C  esomeprazole (NEXIUM) 20 MG capsule Take 20 mg by mouth daily. 02/24/20 10/22/20  [provider]  famotidine (PEPCID) 20 MG tablet Take 1 tablet (20 mg total) by mouth 2 (two) times daily. 03/07/20 10/22/20  Wallis Bamberg, PA-C  sucralfate (CARAFATE) 1 GM/10ML suspension Take 10 mLs (1 g total) by mouth 4 (four) times daily -  with meals and at bedtime. 12/22/18 03/02/19  Emi Holes, PA-C    Family History Family History  Problem Relation Age of Onset   Hypertension Mother    Hypertension Father    Colon cancer Neg Hx    Colon polyps Neg Hx    Esophageal cancer Neg Hx    Rectal cancer Neg Hx    Stomach cancer Neg Hx     Social History Social History   Tobacco Use   Smoking status: Former   Smokeless tobacco: Never  Vaping Use   Vaping status: Never Used  Substance Use Topics   Alcohol use: Yes    Alcohol/week: 8.0 standard drinks of alcohol    Types: 8 Cans of beer per week    Comment: socially   Drug use: Not Currently  Allergies   Ibuprofen, Other, and Zofran [ondansetron hcl]   Review of Systems Review of Systems Pertinent negatives listed in HPI   Physical Exam Triage Vital Signs ED Triage Vitals  Encounter Vitals Group     BP 08/22/23 1227 (!) 145/97     Systolic BP Percentile --      Diastolic BP Percentile --      Pulse Rate 08/22/23 1227 96     Resp 08/22/23 1227 20     Temp 08/22/23 1227 97.9 F (36.6 C)     Temp Source 08/22/23 1227 Oral     SpO2 08/22/23 1227 97 %     Weight 08/22/23 1228 200 lb (90.7 kg)     Height 08/22/23 1228 5\' 4"  (1.626 m)     Head Circumference --      Peak Flow --      Pain Score --      Pain Loc --      Pain Education --      Exclude from Growth Chart --    No data found.  Updated Vital Signs BP (!) 145/97 (BP Location: Right Arm)    Pulse 96   Temp 97.9 F (36.6 C) (Oral)   Resp 20   Ht 5\' 4"  (1.626 m)   Wt 200 lb (90.7 kg)   SpO2 100%   BMI 34.33 kg/m   Visual Acuity Right Eye Distance:   Left Eye Distance:   Bilateral Distance:    Right Eye Near:   Left Eye Near:    Bilateral Near:     Physical Exam Vitals reviewed.  Constitutional:      Appearance: Normal appearance.  HENT:     Head: Normocephalic and atraumatic.     Comments: Patient able to tolerate irrigation of left ear.  Ear cerumen impaction remains in the left ear    Left Ear: Decreased hearing noted. There is impacted cerumen.     Ears:     Comments: Cerumen present in right ear although it is not obstructing hearing and is not a copious amount.  Patient declines irrigation of right ear. Eyes:     Extraocular Movements: Extraocular movements intact.     Conjunctiva/sclera: Conjunctivae normal.     Pupils: Pupils are equal, round, and reactive to light.  Cardiovascular:     Rate and Rhythm: Normal rate and regular rhythm.  Pulmonary:     Effort: Pulmonary effort is normal.     Breath sounds: Normal breath sounds.  Skin:    General: Skin is warm.  Neurological:     General: No focal deficit present.     Mental Status: He is alert and oriented to person, place, and time.     UC Treatments / Results  Labs (all labs ordered are listed, but only abnormal results are displayed) Labs Reviewed - No data to display  EKG   Radiology No results found.  Procedures Procedures (including critical care time)  Medications Ordered in UC Medications - No data to display  Initial Impression / Assessment and Plan / UC Course  I have reviewed the triage vital signs and the nursing notes.  Pertinent labs & imaging results that were available during my care of the patient were reviewed by me and considered in my medical decision making (see chart for details).    Left ear loss of hearing and cerumen impaction, referral placed to  ENT. Unfortunately repeated attempt to remove cerumen was unsuccessful. Patient advised that ENT office  will call him directly or he may call for allow 24 hours for referral to be completed.  Patient verbalized understanding and agreement with plan. Final Clinical Impressions(s) / UC Diagnoses   Final diagnoses:  Hearing loss of left ear due to cerumen impaction  Impacted cerumen of right ear     Discharge Instructions      You are being referred to an ear nose and throat specialist.  Their office will call you to schedule an appointment.  Tylenol or ibuprofen as needed for pain.     ED Prescriptions   None    PDMP not reviewed this encounter.   Bing Neighbors, NP 08/22/23 1454

## 2023-08-30 ENCOUNTER — Encounter (INDEPENDENT_AMBULATORY_CARE_PROVIDER_SITE_OTHER): Payer: Self-pay | Admitting: Otolaryngology

## 2023-09-11 ENCOUNTER — Ambulatory Visit: Payer: 59 | Admitting: Physician Assistant

## 2023-09-11 VITALS — BP 141/87 | HR 69 | Ht 66.0 in | Wt 203.0 lb

## 2023-09-11 DIAGNOSIS — H6123 Impacted cerumen, bilateral: Secondary | ICD-10-CM | POA: Diagnosis not present

## 2023-09-11 DIAGNOSIS — E559 Vitamin D deficiency, unspecified: Secondary | ICD-10-CM | POA: Diagnosis not present

## 2023-09-11 DIAGNOSIS — Z125 Encounter for screening for malignant neoplasm of prostate: Secondary | ICD-10-CM

## 2023-09-11 DIAGNOSIS — R7303 Prediabetes: Secondary | ICD-10-CM

## 2023-09-11 DIAGNOSIS — Z13220 Encounter for screening for lipoid disorders: Secondary | ICD-10-CM

## 2023-09-11 DIAGNOSIS — I10 Essential (primary) hypertension: Secondary | ICD-10-CM

## 2023-09-11 MED ORDER — LISINOPRIL 10 MG PO TABS
10.0000 mg | ORAL_TABLET | Freq: Every day | ORAL | 1 refills | Status: DC
Start: 2023-09-11 — End: 2024-01-22

## 2023-09-11 NOTE — Patient Instructions (Signed)
 I sent a refill of your blood pressure medication to your pharmacy.  I do encourage you to check your blood pressure at home, keep a written log and have available for all office visits.  Please return to the mobile unit on Thursday morning to have your fasting labs completed.  I encourage you to make sure you continue your follow-up with ear nose and throat.  Kirk CANDIE Sage, PA-C Physician Assistant [facility] [email or web address]   Cmo tomarse la presin arterial How to Take Your Blood Pressure La presin arterial es la medida de la fuerza de la sangre al presionar contra las paredes de las arterias. Las arterias son los vasos sanguneos que transportan la sangre desde el corazn hacia todas las partes del cuerpo. Su mdico toma su presin arterial en cada visita al consultorio. Usted tambin puede tomarse la presin arterial en casa con un tensimetro. Es posible que deba tomarse la presin arterial: Para confirmar un diagnstico de presin arterial elevada (hipertensin). Para vigilar su presin arterial a lo largo del tiempo. Para asegurarse de que el medicamento que toma para la presin arterial est surtiendo engineer, mining. Materiales necesarios: Monitor para medir la presin arterial. Una silla para sentarse. Debe ser ignacia silla en la que pueda sentarse erguido con la espalda apoyada. No se siente en un silln blando o sof. Mesa o escritorio. Cuaderno pequeo y lpiz o bolgrafo. Cmo prepararse Para obtener la lectura ms precisa, evite realizar lo siguiente durante los 30 minutos previos a chief operating officer su presin arterial: Beber cafena. Consumir alcohol. [place]. Fumar. Realizar actividad fsica. Cinco minutos antes de controlar su presin arterial: Vaya al bao y orine para tener la vejiga vaca. Sintese tranquilamente en una silla. No hable. Cmo tomarse la presin arterial Para controlar su presin arterial, siga las  instrucciones presentes en el manual que se incluye con el tensimetro. Si tiene ambulance person, las instrucciones podran ser las siguientes: Sintese derecho en una silla. Coloque los pies en el piso. No cruce los tobillos ni las piernas. Apoye su brazo izquierdo al nivel de su corazn en una mesa o escritorio, o en el brazo de la silla. Arremnguese. Envuelva la parte superior de su brazo izquierdo, 1 pulgada (2.5 cm) sobre su codo, con el brazalete. Es mejor optometrist brazalete alrededor de la piel [place]. Ajuste el brazalete ceidamente, pero no demasiado apretado, alrededor del brazo. Debe poder meter nicamente un dedo entre el brazalete y el brazo. Coloque el cordn de modo que quede apoyado en el pliegue del codo. Presione el botn de encendido. Permanezca sentado tranquilamente mientras el brazalete se infla y se desinfla. Lea la lectura digital que aparece en la pantalla del tensimetro y anote los nmeros (regstrelos) en un cuaderno. Espere de 2 a 3 minutos, y luego repita los pasos desde el paso 1. Qu significa mi lectura de presin arterial? Una lectura de la presin arterial consta de un nmero ms alto sobre un nmero ms bajo. En condiciones ideales, la presin arterial debe estar por debajo de 120/80. El primer nmero ("superior") es la presin sistlica. Es la medida de la presin de las arterias cuando el corazn late. El segundo nmero ("inferior") es la presin diastlica. Es la medida de la presin en las arterias cuando el corazn se relaja. La presin arterial se clasifica en cuatro etapas. Las siguientes son las etapas para adultos que no tienen enfermedad grave de corto plazo o una afeccin crnica. La presin sistlica y la presin diastlica se miden  en una unidad llamada mm Hg (milmetros de mercurio).  Normal Presin sistlica: por debajo de 120. Presin diastlica: por debajo de 80. Elevada Presin sistlica: 120-129. Presin diastlica: por debajo de  80. Etapa 1 de hipertensin Presin sistlica: 130-139. Presin diastlica: 80-89. Etapa 2 de hipertensin Presin sistlica: 140 o ms. Presin diastlica: 90 o ms. Puede tener presin arterial elevada o hipertensin incluso si nicamente el nmero sistlico o el diastlico de su lectura es ms elevado de lo normal. Siga estas indicaciones en su casa: Medicamentos Use los medicamentos de venta libre y los recetados solamente como se lo haya indicado el mdico. Dgale a su mdico si tiene efectos secundarios causados por los medicamentos para la presin arterial. Indicaciones generales Mida su presin arterial con la frecuencia recomendada por su mdico. Contrlese la presin arterial a la misma hora todos los [place]. Lleve el tensimetro a su prxima cita con el mdico para asegurarse de lo siguiente: Que lo usa  correctamente. Que genera lecturas precisas. Asegrese de entender cules son sus nmeros objetivo para la presin arterial. Concurra a todas las visitas de seguimiento. Esto es importante. Consejos generales Su mdico puede sugerirle un tensimetro confiable que cumpla con sus necesidades. Existen varios tipos de tensimetros para cabin crew. Escoja un tensimetro que tenga un brazalete. No escoja un tensimetro que mida su presin arterial en la mueca o el dedo. Elija un brazalete que le envuelva ceidamente, pero no ajuste demasiado ni quede muy flojo, la parte superior del brazo. Debe poder meter nicamente un dedo entre el brazalete y el brazo. Puede comprar un tensimetro en la mayora de las farmacias o en lnea. Dnde obtener ms informacin American Heart Association (Asociacin Estadoun[place]ense del [place]): www.heart.org Comunquese con un mdico si: Su presin arterial es sistemticamente alta. Su presin arterial disminuye repentinamente. Solicite ayuda de inmediato si: Su presin arterial sistlica est por encima de 180. Su presin arterial diastlica est  por encima de 120. Estos sntomas pueden customer service manager. Solicite ayuda de inmediato. Llame al 911. No espere a ver si los sntomas desaparecen. No conduzca por sus propios medios officemax incorporated. Resumen La presin arterial es la medida de la fuerza de la sangre al presionar contra las paredes de las arterias. Una lectura de la presin arterial consta de un nmero ms alto sobre un nmero ms bajo. En condiciones ideales, la presin arterial debe estar por debajo de 120/80. Contrlese la presin arterial a la [employer]. Evite la cafena, el alcohol, fumar y hacer actividad fsica durante los 30 minutos anteriores a controlarse la presin arterial. Estos factores pueden afectar la precisin de la lectura de la presin arterial. Esta informacin no tiene como fin reemplazar el consejo del mdico. Asegrese de hacerle al mdico cualquier pregunta que tenga. Document Revised: 06/06/2021 Document Reviewed: 06/06/2021 Elsevier Patient Education  2024 [employer].

## 2023-09-11 NOTE — Progress Notes (Signed)
 New Patient Office Visit  Subjective    Patient ID: Wesley [name], male    DOB: 10-Dec-1965  Age: 57 y.o. MRN: [id number]  CC:  Chief Complaint  Patient presents with   Medication Refill    Out for 2 days, he is experiencing ringing in both ears     HPI [facility] states that he has been out of his blood pressure medication for the past 2 days.  States that he does check his blood pressure at home, states that his blood pressure is generally within normal limits 1 he is taking his blood pressure medication.  States that he was seen at urgent care on December 11, states that he was having difficulty hearing out of his left ear.  States that they did try to remove some of the wax from his ear without relief.  States that he has an upcoming appointment to see an ear nose and throat specialist.  States that he continues to have discomfort in his left ear as well as ringing in both ears.    Outpatient Encounter Medications as of 09/11/2023  Medication Sig   carbamide peroxide (DEBROX) 6.5 % OTIC solution Place 5 drops into both ears 2 (two) times daily.   lisinopril  (ZESTRIL ) 10 MG tablet Take 1 tablet (10 mg total) by mouth daily.   promethazine -dextromethorphan (PROMETHAZINE -DM) 6.25-15 MG/5ML syrup Take 5 mLs by mouth 4 (four) times daily as needed for cough. (Patient not taking: Reported on 09/11/2023)   [DISCONTINUED] amLODipine  (NORVASC ) 10 MG tablet Take 1 tablet (10 mg total) by mouth daily.   [DISCONTINUED] esomeprazole (NEXIUM) 20 MG capsule Take 20 mg by mouth daily.   [DISCONTINUED] famotidine  (PEPCID ) 20 MG tablet Take 1 tablet (20 mg total) by mouth 2 (two) times daily.   [DISCONTINUED] sucralfate  (CARAFATE ) 1 GM/10ML suspension Take 10 mLs (1 g total) by mouth 4 (four) times daily -  with meals and at bedtime.   No facility-administered encounter medications on file as of 09/11/2023.    Past Medical History:  Diagnosis Date   Alcohol abuse    Anxiety    Anxiety     Arthritis    Chronic neck pain    Cocaine abuse (HCC)    Dizziness    GERD (gastroesophageal reflux disease)    Hypercholesteremia    Hypertension    Prediabetes     Past Surgical History:  Procedure Laterality Date   EYE SURGERY Right ~ 1980   hit my eye    Family History  Problem Relation Age of Onset   Hypertension Mother    Hypertension Father    Colon cancer Neg Hx    Colon polyps Neg Hx    Esophageal cancer Neg Hx    Rectal cancer Neg Hx    Stomach cancer Neg Hx     Social History   Socioeconomic History   Marital status: Single    Spouse name: Not on file   Number of children: Not on file   Years of education: Not on file   Highest education level: Not on file  Occupational History   Not on file  Tobacco Use   Smoking status: Former   Smokeless tobacco: Never  Vaping Use   Vaping status: Never Used  Substance and Sexual Activity   Alcohol use: Yes    Alcohol/week: 8.0 standard drinks of alcohol    Types: 8 Cans of beer per week    Comment: socially   Drug use: Not Currently  Sexual activity: Yes    Birth control/protection: None  Other Topics Concern   Not on file  Social History Narrative   Not on file   Social Drivers of Health   Financial Resource Strain: Not on file  Food Insecurity: Not on file  Transportation Needs: Not on file  Physical Activity: Not on file  Stress: Not on file  Social Connections: Unknown (11/20/2022)   Received from [facility], [facility]   Social Network    Social Network: Not on file  Intimate Partner Violence: Unknown (11/20/2022)   Received from [facility], [facility]   HITS    Physically Hurt: Not on file    Insult or Talk Down To: Not on file    Threaten Physical Harm: Not on file    Scream or Curse: Not on file    Review of Systems  Constitutional:  Negative for chills and fever.  HENT:  Positive for tinnitus. Negative for ear discharge and ear pain.   Eyes: Negative.   Respiratory:   Negative for shortness of breath.   Cardiovascular:  Negative for chest pain.  Gastrointestinal: Negative.   Genitourinary: Negative.   Musculoskeletal: Negative.   Skin: Negative.   Neurological: Negative.   Endo/Heme/Allergies: Negative.   Psychiatric/Behavioral: Negative.          Objective    BP (!) 141/87 (BP Location: Left Arm, Patient Position: Sitting, Cuff Size: Large)   Pulse 69   Ht 5' 6 (1.676 m)   Wt 203 lb (92.1 kg)   SpO2 95%   BMI 32.77 kg/m   Physical Exam Vitals and nursing note reviewed.  Constitutional:      Appearance: Normal appearance.  HENT:     Head: Normocephalic and atraumatic.     Right Ear: There is impacted cerumen.     Left Ear: There is impacted cerumen.     Nose: Nose normal.     Mouth/Throat:     Mouth: Mucous membranes are moist.     Pharynx: Oropharynx is clear.  Eyes:     Extraocular Movements: Extraocular movements intact.     Conjunctiva/sclera: Conjunctivae normal.     Pupils: Pupils are equal, round, and reactive to light.  Cardiovascular:     Rate and Rhythm: Normal rate and regular rhythm.     Pulses: Normal pulses.     Heart sounds: Normal heart sounds.  Pulmonary:     Effort: Pulmonary effort is normal.     Breath sounds: Normal breath sounds.  Musculoskeletal:        General: Normal range of motion.     Cervical back: Normal range of motion and neck supple.  Skin:    General: Skin is warm and dry.  Neurological:     General: No focal deficit present.     Mental Status: He is alert and oriented to person, place, and time.  Psychiatric:        Mood and Affect: Mood normal.        Behavior: Behavior normal.        Thought Content: Thought content normal.        Judgment: Judgment normal.        Assessment & Plan:   Problem List Items Addressed This Visit   None  1. Essential hypertension (Primary) Continue current regimen.  Patient encouraged to check blood pressure at home, keep a written log and have  available for all office visits.  Patient declined having labs completed today, would  like to return to mobile unit on Thursday to have fasting labs completed.  Patient given appointment to establish care at patient care.   - lisinopril  (ZESTRIL ) 10 MG tablet; Take 1 tablet (10 mg total) by mouth daily.  Dispense: 30 tablet; Refill: 1 - CBC with Differential/Platelet; Future - Comp. Metabolic Panel (12); Future  2. Impacted cerumen of both ears Continue follow-up with ear nose and throat as scheduled  3. Vitamin D  deficiency  - Vitamin D , 25-hydroxy; Future  4. Prediabetes Unknown previous A1c  5. Screening PSA (prostate specific antigen)  - PSA; Future  6. Screening, lipid  - Lipid panel; Future   I have reviewed the patient's medical history (PMH, PSH, Social History, Family History, Medications, and allergies) , and have been updated if relevant. I spent 30 minutes reviewing chart and  face to face time with patient.    No follow-ups on file.   Kirk RAMAN Mayers, PA-C

## 2023-09-12 ENCOUNTER — Encounter: Payer: Self-pay | Admitting: Physician Assistant

## 2023-09-19 ENCOUNTER — Telehealth (INDEPENDENT_AMBULATORY_CARE_PROVIDER_SITE_OTHER): Payer: Self-pay | Admitting: Physician Assistant

## 2023-09-19 NOTE — Telephone Encounter (Signed)
 Confirmed appt with patient:  Date: 09/20/2023 Status: Sch  Time: 9:00 AM [address] [place] [address] Confirmed

## 2023-09-20 ENCOUNTER — Encounter (INDEPENDENT_AMBULATORY_CARE_PROVIDER_SITE_OTHER): Payer: Self-pay

## 2023-09-20 ENCOUNTER — Institutional Professional Consult (permissible substitution) (INDEPENDENT_AMBULATORY_CARE_PROVIDER_SITE_OTHER): Payer: 59

## 2023-09-24 ENCOUNTER — Telehealth (INDEPENDENT_AMBULATORY_CARE_PROVIDER_SITE_OTHER): Payer: Self-pay | Admitting: Physician Assistant

## 2023-09-24 ENCOUNTER — Institutional Professional Consult (permissible substitution) (INDEPENDENT_AMBULATORY_CARE_PROVIDER_SITE_OTHER): Payer: 59

## 2023-09-24 NOTE — Telephone Encounter (Signed)
 Tried to call to reschedule appointment - VM not set-up.  [id number] afm

## 2023-10-10 ENCOUNTER — Ambulatory Visit: Payer: Self-pay | Admitting: Nurse Practitioner

## 2023-10-22 ENCOUNTER — Encounter (INDEPENDENT_AMBULATORY_CARE_PROVIDER_SITE_OTHER): Payer: Self-pay | Admitting: Physician Assistant

## 2023-12-14 ENCOUNTER — Ambulatory Visit (HOSPITAL_COMMUNITY)
Admission: EM | Admit: 2023-12-14 | Discharge: 2023-12-14 | Disposition: A | Discharge status: Home / Self Care | Payer: Self-pay | Attending: Physician Assistant | Admitting: Physician Assistant

## 2023-12-14 ENCOUNTER — Encounter (HOSPITAL_COMMUNITY): Payer: Self-pay | Admitting: *Deleted

## 2023-12-14 DIAGNOSIS — R200 Anesthesia of skin: Secondary | ICD-10-CM

## 2023-12-14 DIAGNOSIS — M722 Plantar fascial fibromatosis: Secondary | ICD-10-CM

## 2023-12-14 LAB — POCT FASTING CBG KUC MANUAL ENTRY: POCT Glucose (KUC): 140 mg/dL — AB (ref 70–99)

## 2023-12-14 NOTE — ED Provider Notes (Signed)
 [facility]    CSN: [id number] Arrival date & time: 12/14/23  1954      History   Chief Complaint Chief Complaint  Patient presents with   Leg Problem    HPI Wesley [name] is a 58 y.o. male.   HPI  He reports he is having mild numbness and tingling of left lower leg  He reports this sensation is almost constant  He states this started about a week ago  He denies pain in the area, rashes, skin changes  He reports that the numbness seems to start at the pad of his foot and also extends towards the heel.  He also reports numbness in the medial aspect of his calf but did not seem to be connected. He reports having a similar pain around the lower back and SI joint in the past.  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 Authorizing Provider  lisinopril (ZESTRIL) 10 MG tablet Take 1 tablet (10 mg total) by mouth daily. 09/11/23  Yes Mayers, Cari S, PA-C  carbamide peroxide (DEBROX) 6.5 % OTIC solution Place 5 drops into both ears 2 (two) times daily. 08/12/23   Garrison, [place] N, FNP  promethazine-dextromethorphan (PROMETHAZINE-DM) 6.25-15 MG/5ML syrup  Take 5 mLs by mouth 4 (four) times daily as needed for cough. Patient not taking: Reported on 09/11/2023 07/22/23   Landis Martins, PA-C  esomeprazole (NEXIUM) 20 MG capsule Take 20 mg by mouth daily. 02/24/20 10/22/20  [provider]  famotidine (PEPCID) 20 MG tablet Take 1 tablet (20 mg total) by mouth 2 (two) times daily. 03/07/20 10/22/20  Wallis Bamberg, PA-C  sucralfate (CARAFATE) 1 GM/10ML suspension Take 10 mLs (1 g total) by mouth 4 (four) times daily -  with meals and at bedtime. 12/22/18 03/02/19  Emi Holes, PA-C    Family History Family History  Problem Relation Age of Onset   Hypertension Mother    Hypertension Father    Colon cancer Neg Hx    Colon polyps Neg Hx    Esophageal cancer Neg Hx    Rectal cancer Neg Hx    Stomach cancer Neg Hx     Social History Social History   Tobacco Use   Smoking status: Former   Smokeless tobacco: Never  Vaping Use   Vaping status: Never Used  Substance Use Topics   Alcohol use: Yes    Alcohol/week: 8.0 standard drinks of alcohol    Types: 8 Cans of beer per week    Comment: socially  Drug use: Not Currently     Allergies   Ibuprofen, Other, and Zofran [ondansetron hcl]   Review of Systems Review of Systems   Physical Exam Triage Vital Signs ED Triage Vitals  Encounter Vitals Group     BP 12/14/23 2002 (!) 145/98     Systolic BP Percentile --      Diastolic BP Percentile --      Pulse Rate 12/14/23 2002 87     Resp 12/14/23 2002 18     Temp 12/14/23 2002 98.2 F (36.8 C)     Temp Source 12/14/23 2002 Oral     SpO2 12/14/23 2002 95 %     Weight --      Height --      Head Circumference --      Peak Flow --      Pain Score 12/14/23 2001 0     Pain Loc --      Pain Education --      Exclude from Growth Chart --    No data found.  Updated Vital Signs BP (!) 145/98 (BP Location: Left Arm)   Pulse 87   Temp 98.2 F (36.8 C) (Oral)   Resp 18   SpO2 95%   Visual Acuity Right Eye Distance:    Left Eye Distance:   Bilateral Distance:    Right Eye Near:   Left Eye Near:    Bilateral Near:     Physical Exam Vitals reviewed.  Constitutional:      Appearance: Normal appearance.  HENT:     Head: Normocephalic and atraumatic.  Cardiovascular:     Pulses:          Dorsalis pedis pulses are 2+ on the left side.       Posterior tibial pulses are 2+ on the left side.  Pulmonary:     Effort: Pulmonary effort is normal.  Musculoskeletal:     Left foot: No deformity, foot drop or prominent metatarsal heads.  Feet:     Left foot:     Skin integrity: Skin integrity normal. No ulcer, blister, skin breakdown, erythema, warmth, callus, dry skin or fissure.     Toenail Condition: Left toenails are abnormally thick. Fungal disease present.    Comments: Patient indicates numbness and tingling along the pad of the foot, arch and heel of the left foot.  No obvious signs of skin breakdown, erythema or sensitivity noted on exam.  Anterior drawer testing is negative for signs of laxity.  Dorsiflexion and plantarflexion are intact and appear to have 5 out of 5 strength. Skin:    General: Skin is warm and dry.  Neurological:     General: No focal deficit present.     Mental Status: He is alert and oriented to person, place, and time.  Psychiatric:        Mood and Affect: Mood normal.        Behavior: Behavior normal.        Thought Content: Thought content normal.      UC Treatments / Results  Labs (all labs ordered are listed, but only abnormal results are displayed) Labs Reviewed  POCT FASTING CBG [facility] MANUAL ENTRY - Abnormal; Notable for the following components:      Result Value   POCT Glucose ([facility]) 140 (*)    All other components within normal limits    EKG   Radiology No results found.  Procedures Procedures (including critical care time)  Medications Ordered in UC  Medications - No data to display  Initial Impression / Assessment and Plan / UC Course  I have  reviewed the triage vital signs and the nursing notes.  Pertinent labs & imaging results that were available during my care of the patient were reviewed by me and considered in my medical decision making (see chart for details).      Final Clinical Impressions(s) / UC Diagnoses   Final diagnoses:  Plantar fasciitis of left foot  Numbness of left foot    Patient presents today with concerns for numbness of the pad of the left foot along with numbness of the heel and numbness of the medial aspect of the calf.  He denies pain, swelling, redness.  He reports that this has been ongoing for about a week.  He is curious if this may be related to his prediabetes.  It appears that the most recent A1c was done several years ago but he has had several point-of-care glucose testing completed.  He is requesting glucose testing today which was 140.  Results were discussed with him during his.  At this time.  Overall reassuring physical exam I am suspicious of potential plantar fasciitis but I cannot rule out diabetic neuropathy.  Recommend close follow-up with her primary care provider for ongoing management and A1c testing.  Will provide plantar fasciitis patient education materials as well as stretches.  Follow-up as needed with PCP    Discharge Instructions      At this time I suspect he may have a condition called plantar fasciitis.  I have included some education materials for you to review to help manage this.  I do recommend that you follow-up with your primary care provider for ongoing management and to check your A1c for diabetes.  They would also need to manage your medications if you are found to have diabetes.  Please make sure that you are keeping your feet clean and dry to help prevent skin breakdown.  Please try to follow some of the stretches and exercises that I have included in your paperwork and see if this improves some of the numbness.  En este momento, sospecho que podra tener  fascitis plantar. He incluido material educativo para que lo revise y le ayude a Forensic scientist. Le recomiendo que consulte con su mdico de cabecera para un control continuo y para que le revise la A1c para detectar diabetes. Tambin deber controlar su medicacin si se detecta diabetes. Por favor, asegrese de [employer] limpios y secos para prevenir el deterioro de la piel. Intente seguir algunos de los estiramientos y ejercicios que he incluido en su documentacin para ver si esto mejora el entumecimiento.     ED Prescriptions   None    PDMP not reviewed this encounter.   Roselind Messier 12/14/23 2042

## 2023-12-14 NOTE — ED Triage Notes (Signed)
 Pt states that his left lower leg feels like its asleep but no pain X last couple days. He doesn't take any meds for this issue. He denies any injury  He does states he is prediabetic and he doesn't take meds so he wonders if he has a high blood sugar.

## 2023-12-14 NOTE — Discharge Instructions (Signed)
 At this time I suspect he may have a condition called plantar fasciitis.  I have included some education materials for you to review to help manage this.  I do recommend that you follow-up with your primary care provider for ongoing management and to check your A1c for diabetes.  They would also need to manage your medications if you are found to have diabetes.  Please make sure that you are keeping your feet clean and dry to help prevent skin breakdown.  Please try to follow some of the stretches and exercises that I have included in your paperwork and see if this improves some of the numbness.  En este momento, sospecho que podra tener fascitis plantar. He incluido material educativo para que lo revise y le ayude a Forensic scientist. Le recomiendo que consulte con su mdico de cabecera para un control continuo y para que le revise la A1c para detectar diabetes. Tambin deber controlar su medicacin si se detecta diabetes. Por favor, asegrese de [employer] limpios y secos para prevenir el deterioro de la piel. Intente seguir algunos de los estiramientos y ejercicios que he incluido en su documentacin para ver si esto mejora el entumecimiento.

## 2023-12-20 ENCOUNTER — Emergency Department (HOSPITAL_COMMUNITY): Admission: EM | Admit: 2023-12-20 | Discharge: 2023-12-20 | Discharge status: Against Medical Advice | Payer: Self-pay

## 2024-01-22 ENCOUNTER — Ambulatory Visit (INDEPENDENT_AMBULATORY_CARE_PROVIDER_SITE_OTHER): Payer: Self-pay | Admitting: Nurse Practitioner

## 2024-01-22 ENCOUNTER — Encounter: Payer: Self-pay | Admitting: Nurse Practitioner

## 2024-01-22 VITALS — BP 128/89 | HR 81 | Temp 97.1°F | Wt 202.0 lb

## 2024-01-22 DIAGNOSIS — R21 Rash and other nonspecific skin eruption: Secondary | ICD-10-CM | POA: Insufficient documentation

## 2024-01-22 DIAGNOSIS — I10 Essential (primary) hypertension: Secondary | ICD-10-CM

## 2024-01-22 DIAGNOSIS — H6123 Impacted cerumen, bilateral: Secondary | ICD-10-CM | POA: Insufficient documentation

## 2024-01-22 DIAGNOSIS — E1165 Type 2 diabetes mellitus with hyperglycemia: Secondary | ICD-10-CM | POA: Insufficient documentation

## 2024-01-22 DIAGNOSIS — R200 Anesthesia of skin: Secondary | ICD-10-CM | POA: Insufficient documentation

## 2024-01-22 HISTORY — DX: Type 2 diabetes mellitus with hyperglycemia: E11.65

## 2024-01-22 MED ORDER — LISINOPRIL 10 MG PO TABS: 10.0000 mg | ORAL_TABLET | Freq: Every day | ORAL | 1 refills | Status: AC

## 2024-01-22 MED ORDER — CLOTRIMAZOLE 1 % EX OINT: TOPICAL_OINTMENT | CUTANEOUS | 0 refills | Status: DC

## 2024-01-22 NOTE — Assessment & Plan Note (Signed)
 Screening for diabetes, and B12 deficiency Patient wants a referral to podiatrist referral placed

## 2024-01-22 NOTE — Assessment & Plan Note (Signed)
-   Clotrimazole  1 % OINT; Apply to affected area twice daily for 2 weeks.  Dispense: 56.7 g; Refill: 0 - Ambulatory referral to Dermatology Avoid scratching sites to prevent infection

## 2024-01-22 NOTE — Assessment & Plan Note (Signed)
 Impacted cerumen noted bilaterally Patient left without having ear flushing done

## 2024-01-22 NOTE — Assessment & Plan Note (Signed)
 Lisinopril  10 mg daily refilled DASH diet and commitment to daily physical activity for a minimum of 30 minutes discussed and encouraged, as a part of hypertension management. The importance of attaining a healthy weight is also discussed.     01/22/2024    9:59 AM 01/22/2024    9:55 AM 12/14/2023    8:02 PM 09/11/2023    8:48 AM 08/22/2023   12:28 PM 08/22/2023   12:27 PM 08/12/2023   12:57 PM  BP/Weight  Systolic BP 128 145 145 141  145 124  Diastolic BP 89 87 98 87  97 85  Wt. (Lbs)  202  203 200    BMI  32.6 kg/m2  32.77 kg/m2 34.33 kg/m2

## 2024-01-22 NOTE — Progress Notes (Signed)
 New Patient Office Visit  Subjective:  Patient ID: Wesley [name], male    DOB: 14-Mar-1966  Age: 59 y.o. MRN: [id number]  CC:  Chief Complaint  Patient presents with   Establish Care   left leg     Has knot on the top that causes discomfort when walking sometime up to hip bone    HPI Wesley [name] is a 58 y.o. male  has a past medical history of Alcohol abuse, Anxiety, Anxiety, Arthritis, Chronic neck pain, Cocaine abuse (HCC), Dizziness, GERD (gastroesophageal reflux disease), Hypercholesteremia, Hypertension, Prediabetes, and Type 2 diabetes mellitus with hyperglycemia, without long-term current use of insulin  (HCC) (01/22/2024). Patient presents for establishing care.  Has no PCP  Hypertension.  Currently on lisinopril  10 mg daily.  Patient denies chest pain shortness of breath edema  Numbness of left heel patient complains of numbness and tingling of the left heel that started recently, states that his symptoms makes it difficult for him to walk.  He denies aching pain, trauma.  Has history of diabetes but not on medication.  CBG was 140 when checked in April at the urgent care.   Impacted cerumen bilaterally patient complains of ringing sensation in the left ear for months, no ear pain, ear discharge.  Of note patient had been seen at the urgent care for impacted cerumen bilaterally and had been referred to ENT for this.   Rashes.  Patient complains of itchy rashes on his abdomen, has tried steroid cream in the past without relief.  He would like a referral to dermatologist  Labs were ordered to be done at the office today, also plan to to do ear irrigation but the patient left without having ear irrigation done or labs drawn.  Will heave the nurse call the patient on the phone and have him come to the office for the procedure and labs     Past Medical History:  Diagnosis Date   Alcohol abuse    Anxiety    Anxiety    Arthritis    Chronic neck pain    Cocaine abuse (HCC)     Dizziness    GERD (gastroesophageal reflux disease)    Hypercholesteremia    Hypertension    Prediabetes    Type 2 diabetes mellitus with hyperglycemia, without long-term current use of insulin  (HCC) 01/22/2024    Past Surgical History:  Procedure Laterality Date   EYE SURGERY Right ~ 1980   "hit my eye"    Family History  Problem Relation Age of Onset   Hypertension Mother    Hypertension Father    Colon cancer Neg Hx    Colon polyps Neg Hx    Esophageal cancer Neg Hx    Rectal cancer Neg Hx    Stomach cancer Neg Hx     Social History   Socioeconomic History   Marital status: Married    Spouse name: Not on file   Number of children: 1   Years of education: Not on file   Highest education level: Not on file  Occupational History   Not on file  Tobacco Use   Smoking status: Former   Smokeless tobacco: Never  Vaping Use   Vaping status: Never Used  Substance and Sexual Activity   Alcohol use: Yes    Alcohol/week: 8.0 standard drinks of alcohol    Types: 8 Cans of beer per week    Comment: socially   Drug use: Not Currently   Sexual activity: Yes  Birth control/protection: None  Other Topics Concern   Not on file  Social History Narrative   Lives with his wife    Social Drivers of Corporate investment banker Strain: Not on file  Food Insecurity: Not on file  Transportation Needs: Not on file  Physical Activity: Not on file  Stress: Not on file  Social Connections: Unknown (11/20/2022)   Received from [facility], [facility]   Social Network    Social Network: Not on file  Intimate Partner Violence: Unknown (11/20/2022)   Received from [facility], [facility]   HITS    Physically Hurt: Not on file    Insult or Talk Down To: Not on file    Threaten Physical Harm: Not on file    Scream or Curse: Not on file    ROS Review of Systems  Constitutional:  Negative for appetite change, chills, fatigue and fever.  HENT:  Negative for congestion,  postnasal drip, rhinorrhea and sneezing.   Respiratory:  Negative for cough, shortness of breath and wheezing.   Cardiovascular:  Negative for chest pain, palpitations and leg swelling.  Gastrointestinal:  Negative for abdominal pain, constipation, nausea and vomiting.  Genitourinary:  Negative for difficulty urinating, dysuria, flank pain and frequency.  Musculoskeletal:  Negative for arthralgias, back pain, joint swelling and myalgias.  Skin:  Positive for rash. Negative for color change, pallor and wound.  Neurological:  Negative for dizziness, facial asymmetry, weakness, numbness and headaches.  Psychiatric/Behavioral:  Negative for behavioral problems, confusion, self-injury and suicidal ideas.     Objective:   Today's Vitals: BP 128/89   Pulse 81   Temp (!) 97.1 F (36.2 C)   Wt 202 lb (91.6 kg)   SpO2 100%   BMI 32.60 kg/m   Physical Exam Vitals and nursing note reviewed.  Constitutional:      General: He is not in acute distress.    Appearance: Normal appearance. He is obese. He is not ill-appearing, toxic-appearing or diaphoretic.  HENT:     Right Ear: External ear normal. There is impacted cerumen.     Left Ear: There is impacted cerumen.  Cardiovascular:     Rate and Rhythm: Normal rate and regular rhythm.     Pulses: Normal pulses.     Heart sounds: Normal heart sounds. No murmur heard.    No friction rub. No gallop.  Pulmonary:     Effort: Pulmonary effort is normal. No respiratory distress.     Breath sounds: Normal breath sounds. No stridor. No wheezing, rhonchi or rales.  Chest:     Chest wall: No tenderness.  Abdominal:     General: There is no distension.     Palpations: Abdomen is soft.     Tenderness: There is no abdominal tenderness. There is no right CVA tenderness, left CVA tenderness or guarding.  Musculoskeletal:        General: No swelling, tenderness, deformity or signs of injury.     Right lower leg: No edema.     Left lower leg: No edema.      Comments: No sensation  noted on examination of the toes and heel on the left foot using a monofilament.  Normal sensation on the right foot.  No redness, or swelling noted  Skin:    General: Skin is warm and dry.     Capillary Refill: Capillary refill takes 2 to 3 seconds.     Coloration: Skin is not jaundiced or pale.  Findings: Rash present. No bruising, erythema or lesion.     Comments: Erythematous rashes noted on the lower abdomen  Neurological:     Mental Status: He is alert and oriented to person, place, and time.     Motor: No weakness.     Coordination: Coordination normal.     Gait: Gait normal.  Psychiatric:        Mood and Affect: Mood normal.        Behavior: Behavior normal.        Thought Content: Thought content normal.        Judgment: Judgment normal.     Assessment & Plan:   Problem List Items Addressed This Visit       Cardiovascular and Mediastinum   Essential hypertension (Chronic)   Lisinopril  10 mg daily refilled DASH diet and commitment to daily physical activity for a minimum of 30 minutes discussed and encouraged, as a part of hypertension management. The importance of attaining a healthy weight is also discussed.     01/22/2024    9:59 AM 01/22/2024    9:55 AM 12/14/2023    8:02 PM 09/11/2023    8:48 AM 08/22/2023   12:28 PM 08/22/2023   12:27 PM 08/12/2023   12:57 PM  BP/Weight  Systolic BP 128 145 145 141  145 124  Diastolic BP 89 87 98 87  97 85  Wt. (Lbs)  202  203 200    BMI  32.6 kg/m2  32.77 kg/m2 34.33 kg/m2             Relevant Medications   lisinopril  (ZESTRIL ) 10 MG tablet   Other Relevant Orders   CMP14+EGFR     Endocrine   Type 2 diabetes mellitus with hyperglycemia, without long-term current use of insulin  (HCC)   03/03/2022 03/04/2022 HEMOGLOBIN A1C hemoglobin A1C 6.6  A1c ordered      Relevant Medications   lisinopril  (ZESTRIL ) 10 MG tablet   Other Relevant Orders   Hemoglobin A1c   CBC   Microalbumin /  creatinine urine ratio     Nervous and Auditory   Impacted cerumen, bilateral - Primary   Impacted cerumen noted bilaterally Patient left without having ear flushing done        Musculoskeletal and Integument   Rash and other nonspecific skin eruption    - Clotrimazole  1 % OINT; Apply to affected area twice daily for 2 weeks.  Dispense: 56.7 g; Refill: 0 - Ambulatory referral to Dermatology Avoid scratching sites to prevent infection      Relevant Medications   Clotrimazole  1 % OINT   Other Relevant Orders   Ambulatory referral to Dermatology     Other   Numbness of left foot   Screening for diabetes, and B12 deficiency Patient wants a referral to podiatrist referral placed       Relevant Orders   Ambulatory referral to Podiatry   Hemoglobin A1c   CMP14+EGFR   Vitamin B12    Outpatient Encounter Medications as of 01/22/2024  Medication Sig   carbamide peroxide (DEBROX) 6.5 % OTIC solution Place 5 drops into both ears 2 (two) times daily.   Clotrimazole  1 % OINT Apply to affected area twice daily for 2 weeks.   [DISCONTINUED] lisinopril  (ZESTRIL ) 10 MG tablet Take 1 tablet (10 mg total) by mouth daily.   lisinopril  (ZESTRIL ) 10 MG tablet Take 1 tablet (10 mg total) by mouth daily.   promethazine -dextromethorphan (PROMETHAZINE -DM) 6.25-15 MG/5ML syrup Take 5 mLs by mouth  4 (four) times daily as needed for cough. (Patient not taking: Reported on 01/22/2024)   [DISCONTINUED] esomeprazole (NEXIUM) 20 MG capsule Take 20 mg by mouth daily.   [DISCONTINUED] famotidine  (PEPCID ) 20 MG tablet Take 1 tablet (20 mg total) by mouth 2 (two) times daily.   [DISCONTINUED] sucralfate  (CARAFATE ) 1 GM/10ML suspension Take 10 mLs (1 g total) by mouth 4 (four) times daily -  with meals and at bedtime.   No facility-administered encounter medications on file as of 01/22/2024.    Follow-up: No follow-ups on file.   Royce Sciara R Prakriti Carignan, FNP

## 2024-01-22 NOTE — Assessment & Plan Note (Addendum)
 03/03/2022 03/04/2022 HEMOGLOBIN A1C hemoglobin A1C 6.6  A1c ordered

## 2024-04-03 ENCOUNTER — Emergency Department (HOSPITAL_COMMUNITY)
Admission: EM | Admit: 2024-04-03 | Discharge: 2024-04-03 | Disposition: A | Discharge status: Home / Self Care | Payer: Self-pay | Attending: Emergency Medicine | Admitting: Emergency Medicine

## 2024-04-03 ENCOUNTER — Other Ambulatory Visit: Payer: Self-pay

## 2024-04-03 DIAGNOSIS — Z79899 Other long term (current) drug therapy: Secondary | ICD-10-CM | POA: Insufficient documentation

## 2024-04-03 DIAGNOSIS — I10 Essential (primary) hypertension: Secondary | ICD-10-CM | POA: Insufficient documentation

## 2024-04-03 DIAGNOSIS — H43392 Other vitreous opacities, left eye: Secondary | ICD-10-CM | POA: Insufficient documentation

## 2024-04-03 MED ORDER — AMLODIPINE BESYLATE 5 MG PO TABS: 5.0000 mg | ORAL_TABLET | Freq: Every day | ORAL | 3 refills | Status: DC

## 2024-04-03 NOTE — ED Triage Notes (Signed)
 Patient arrives for eval of hypertension. States he did not take his Lisinopril  and Amlodipine  x 2 days because he becomes hypotensive. Had blurred vision on waking this morning [facility] and it was 188/120. He then too his prescribed meds approximately 20 mins PTA. Also complains of L lower back pain radiating down into his leg, ongoing x 2 weeks. Injury to same years ago.

## 2024-04-03 NOTE — ED Provider Notes (Addendum)
 [place] EMERGENCY DEPARTMENT AT [facility] Provider Note   CSN: [id number] Arrival date & time: 04/03/24  9265     Patient presents with: Hypertension   Wesley [name] is a 58 y.o. male.   58 yo M with a chief complaints of high blood pressure and change to his vision.  He tells me that the vision is blurry when asked, described that he says that there are black spots in it.  He denies headache or neck pain.  Denies trauma.  Denies one-sided numbness or weakness denies difficulty speech or swallowing.  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  Symptoms started about 48 hours ago.  Have not improved.  [facility] and the systolic was in the 180s which concerned him.  Recheck today was still elevated so came here to be evaluated.     Hypertension       Prior to Admission medications   Medication Sig Start Date End Date Taking? Authorizing Provider  amLODipine  (NORVASC ) 5 MG tablet Take 1 tablet (5 mg total) by mouth daily. 04/03/24  Yes Emil Share, DO  carbamide peroxide (DEBROX) 6.5 % OTIC solution Place 5 drops into both ears 2 (two) times daily. 08/12/23   Dreama, [place]  N, FNP  Clotrimazole  1 % OINT Apply to affected area twice daily for 2 weeks. 01/22/24   Paseda, Folashade R, FNP  lisinopril  (ZESTRIL ) 10 MG tablet Take 1 tablet (10 mg total) by mouth daily. 01/22/24   Paseda, Folashade R, FNP  promethazine -dextromethorphan (PROMETHAZINE -DM) 6.25-15 MG/5ML syrup Take 5 mLs by mouth 4 (four) times daily as needed for cough. Patient not taking: Reported on 01/22/2024 07/22/23   Teresa Almarie LABOR, PA-C  esomeprazole (NEXIUM) 20 MG capsule Take 20 mg by mouth daily. 02/24/20 10/22/20  [provider]  famotidine  (PEPCID ) 20 MG tablet Take 1 tablet (20 mg total) by mouth 2 (two) times daily. 03/07/20 10/22/20  Christopher Savannah, PA-C  sucralfate  (CARAFATE ) 1 GM/10ML suspension Take 10 mLs (1 g total) by mouth 4 (four) times daily -  with  meals and at bedtime. 12/22/18 03/02/19  Rendell Lorane HERO, PA-C    Allergies: Ibuprofen , Other, and Zofran  [ondansetron  hcl]    Review of Systems  Updated Vital Signs BP (!) 153/108 (BP Location: Right Arm)   Pulse 81   Temp 98 F (36.7 C) (Oral)   Resp 18   SpO2 100%   Physical Exam Vitals and nursing note reviewed.  Constitutional:      Appearance: He is well-developed.  HENT:     Head: Normocephalic and atraumatic.  Neck:     Vascular: No JVD.  Cardiovascular:     Rate and Rhythm: Normal rate and regular rhythm.     Heart sounds: No murmur heard.    No friction rub. No gallop.  Pulmonary:     Effort: No respiratory distress.     Breath sounds: No wheezing.  Abdominal:     General: There is no distension.     Tenderness: There is no abdominal tenderness. There is no guarding or rebound.  Musculoskeletal:        General: Normal range of motion.     Cervical back: Normal range of motion and neck supple.  Skin:    Coloration: Skin is not pale.     Findings: No rash.  Neurological:     Mental Status: He is alert and oriented to person, place, and time.  Psychiatric:        Behavior: Behavior normal.     (all labs ordered are listed, but only abnormal results are displayed) Labs Reviewed  CBC WITH DIFFERENTIAL/PLATELET  BASIC METABOLIC PANEL WITH GFR    EKG: None  Radiology: No results found.   Procedures   Medications Ordered in the ED - No data to display                                  Medical Decision Making Amount and/or Complexity of Data Reviewed Labs: ordered. Radiology: ordered. ECG/medicine tests: ordered.  Risk Prescription drug management.   58 yo M with a chief complaints of change in vision to his left eye.  Unfortunately the patient had a traumatic injury and has no vision in his right eye.  He describes it as more flashes and floaters.  It is difficult to ascertain if this is a visual field cut he does have some issues following  my finger for extraocular motor.  I did order an MRI but the patient is declining.  He would just like to be started on a blood pressure medication.  I did stress that he only has vision in 1 eye and needs to be seen emergently by the ophthalmologist today.  I did discuss the limitations of not finding out if this was a stroke especially since he only has retained vision in 1 eye.  Patient states understanding.  Will discharge home.  8:26 AM:  I have discussed the diagnosis/risks/treatment options with the patient and family.  Evaluation and diagnostic testing in the emergency department does not suggest an emergent condition requiring admission or immediate intervention beyond what has been performed at this time.  They will follow up with optho, PCP. We also discussed returning to the ED immediately if new or worsening sx occur. We discussed the sx which are most concerning (e.g., sudden worsening pain, fever, inability to tolerate by mouth) that necessitate immediate return. Medications administered to the patient during their visit and any new prescriptions provided to the patient are listed below.  Medications given during this visit Medications - No data to display   The patient appears reasonably screen and/or stabilized for discharge and I doubt any other medical condition or other [facility] requiring further screening, evaluation, or treatment in the ED at this time prior to discharge.       Final diagnoses:  Floaters in visual field, left  Uncontrolled hypertension    ED Discharge Orders          Ordered    amLODipine  (NORVASC ) 5 MG tablet  Daily        04/03/24 0816                 Emil Share, DO 04/03/24 9172

## 2024-04-03 NOTE — ED Notes (Signed)
 IV attempt unsuccessful; pt. Refuses to have blood work done.

## 2024-04-03 NOTE — ED Notes (Signed)
 Pt in bed, pt states that he doesn't want to go to MRI, explained to pt that he was getting an MRI to rule out a stroke due to his dizziness and vision, pt states that he is not having a stroke and he is only here for his blood pressure.  Explained to pt that his htn could lead to a stroke and that it what the doctor wanted to make sure he didn't have.  MD at bedside talking with pt about MRI.  Pt refused MRI.  Pt states that his vision is normal for his, states that he had an accident a long time ago and can't see very well out of his one eye.  States that it is his normal.  Pt states that he wants to go home.  Pt ambulatory from department with steady gait with sig other.

## 2024-04-03 NOTE — Discharge Instructions (Addendum)
 Floaters in your visual fields can be a normal finding.  You do need to be seen today by the ophthalmologist to make sure that you do not have a vision threatening emergencies since you only have vision in that 1 eye.  Please call them this morning and tell them that you need to be seen today.  I did want to get an MRI to make sure that you were not having a stroke.  You have declined to have this done.  It is still possible that you are having a stroke.  Please follow-up with your family doctor in the office.  I have started you on a medication for blood pressure.  Typically this is best managed through her primary care setting.  Please let your family doctor know.  Las miodesopsias en sus campos visuales pueden ser un hallazgo normal. Necesita que lo vea hoy el oftalmlogo para asegurarse de que no tenga una emergencia que ponga en peligro su visin, ya que solo ve en ese ojo. Por favor, llmelos esta maana y dgales que necesita que lo vean hoy.  Elois hacerle una resonancia magntica para asegurarme de que no estuviera sufriendo un derrame cerebral. Se ha negado a hacrsela. An es posible que est sufriendo un derrame cerebral. Por favor, consulte con su mdico de cabecera en la consulta.  He comenzado a darle un medicamento para la presin arterial. Normalmente, esto se controla mejor en su centro de atencin primaria. Por favor, informe a su mdico de cabecera.

## 2024-04-04 ENCOUNTER — Telehealth: Payer: Self-pay

## 2024-04-04 NOTE — Transitions of Care (Post Inpatient/ED Visit) (Signed)
   04/04/2024  Name: Ray Gervasi MRN: [id number] DOB: 12/06/1965  Today's TOC FU Call Status: Today's TOC FU Call Status:: Unsuccessful Call (1st Attempt) Unsuccessful Call (1st Attempt) Date: 04/04/24  Attempted to reach the patient regarding the most recent Inpatient/ED visit.  Follow Up Plan: Additional outreach attempts will be made to reach the patient to complete the Transitions of Care (Post Inpatient/ED visit) call.   Signature  [employer], [place]

## 2024-04-07 ENCOUNTER — Telehealth: Payer: Self-pay

## 2024-04-07 NOTE — Transitions of Care (Post Inpatient/ED Visit) (Signed)
   04/07/2024  Name: Wesley [name] MRN: [id number] DOB: 08/04/1966  Today's TOC FU Call Status:   Patient's Name and Date of Birth confirmed.  Transition Care Management Follow-up Telephone Call Discharge Facility: [facility]) Type of Discharge: Emergency Department How have you been since you were released from the hospital?: Better Any questions or concerns?: No  Items Reviewed: Did you receive and understand the discharge instructions provided?: Yes Medications obtained,verified, and reconciled?: Yes (Medications Reviewed) Any new allergies since your discharge?: No Dietary orders reviewed?: NA Do you have support at home?: No  Medications Reviewed Today: Medications Reviewed Today   Medications were not reviewed in this encounter     Home Care and Equipment/Supplies: Were Home Health Services Ordered?: NA Any new equipment or medical supplies ordered?: NA  Functional Questionnaire: Do you need assistance with bathing/showering or dressing?: No Do you need assistance with meal preparation?: No Do you need assistance with eating?: No Do you have difficulty maintaining continence: No Do you need assistance with getting out of bed/getting out of a chair/moving?: No Do you have difficulty managing or taking your medications?: No  Follow up appointments reviewed: PCP Follow-up appointment confirmed?: Yes Date of PCP follow-up appointment?: 04/11/24 Follow-up Provider: [facility] Follow-up appointment confirmed?: NA Do you need transportation to your follow-up appointment?: No Do you understand care options if your condition(s) worsen?: Yes-patient verbalized understanding    SIGNATURE Zaiden Ludlum, RMA

## 2024-04-11 ENCOUNTER — Inpatient Hospital Stay: Payer: Self-pay | Admitting: Nurse Practitioner

## 2024-04-16 ENCOUNTER — Other Ambulatory Visit: Payer: Self-pay

## 2024-04-16 ENCOUNTER — Emergency Department (HOSPITAL_COMMUNITY)
Admission: EM | Admit: 2024-04-16 | Discharge: 2024-04-16 | Discharge status: Against Medical Advice | Payer: Self-pay | Attending: Emergency Medicine | Admitting: Emergency Medicine

## 2024-04-16 DIAGNOSIS — I10 Essential (primary) hypertension: Secondary | ICD-10-CM | POA: Insufficient documentation

## 2024-04-16 DIAGNOSIS — Z5321 Procedure and treatment not carried out due to patient leaving prior to being seen by health care provider: Secondary | ICD-10-CM | POA: Insufficient documentation

## 2024-04-16 NOTE — ED Triage Notes (Signed)
 [facility] , he has not taken his antihypertensive medication yesterday , denies pain/respirations unlabored .

## 2024-04-29 ENCOUNTER — Telehealth: Payer: Self-pay

## 2024-04-29 NOTE — Transitions of Care (Post Inpatient/ED Visit) (Signed)
   04/29/2024  Name: Wesley [name] MRN: [id number] DOB: [birth year]  Today's TOC FU Call Status:   Patient's Name and Date of Birth confirmed.  Transition Care Management Follow-up Telephone Call Date of Discharge: 04/16/24 [facility]charge Facility: [facility]) Type of Discharge: Emergency Department How have you been since you were released from the hospital?: Better Any questions or concerns?: No  Items Reviewed: Did you receive and understand the discharge instructions provided?: Yes Medications obtained,verified, and reconciled?: Yes (Medications Reviewed) Any new allergies since your discharge?: No Dietary orders reviewed?: No Do you have support at home?: No  Medications Reviewed Today: Medications Reviewed Today     Reviewed by Starlene Charlynn BIRCH, CMA (Certified Medical Assistant) on 04/29/24 at 0934  Med List Status: <None>   Medication Order Taking? Sig Documenting Provider Last Dose Status Informant  amLODipine  (NORVASC ) 5 MG tablet [id number] Yes Take 1 tablet (5 mg total) by mouth daily. Emil Share, DO  Active   carbamide peroxide (DEBROX) 6.5 % OTIC solution [id number] Yes Place 5 drops into both ears 2 (two) times daily. Dreama, [place]  N, FNP  Active   Clotrimazole  1 % OINT [id number] Yes Apply to affected area twice daily for 2 weeks. Paseda, Folashade R, FNP  Active     Discontinued 10/22/20 1703     Discontinued 10/22/20 1703   lisinopril  (ZESTRIL ) 10 MG tablet [id number] Yes Take 1 tablet (10 mg total) by mouth daily. Paseda, Folashade R, FNP  Active   promethazine -dextromethorphan (PROMETHAZINE -DM) 6.25-15 MG/5ML syrup [id number]  Take 5 mLs by mouth 4 (four) times daily as needed for cough.  Patient not taking: Reported on 01/22/2024   Teresa Almarie DELENA DEVONNA  Active     Discontinued 03/02/19 1039             Home Care and Equipment/Supplies: Were Home Health Services Ordered?: NA Any new equipment or medical supplies ordered?: NA  Functional  Questionnaire: Do you need assistance with bathing/showering or dressing?: No Do you need assistance with meal preparation?: No Do you need assistance with eating?: No Do you have difficulty maintaining continence: No Do you need assistance with getting out of bed/getting out of a chair/moving?: No Do you have difficulty managing or taking your medications?: No  Follow up appointments reviewed: PCP Follow-up appointment confirmed?: Yes Date of PCP follow-up appointment?: 05/02/24 Specialist Hospital Follow-up appointment confirmed?: NA Do you need transportation to your follow-up appointment?: No Do you understand care options if your condition(s) worsen?: Yes-patient verbalized understanding    SIGNATURE Starlit Raburn, RMA

## 2024-05-02 ENCOUNTER — Inpatient Hospital Stay: Payer: Self-pay | Admitting: Nurse Practitioner

## 2024-06-11 ENCOUNTER — Ambulatory Visit: Payer: Self-pay | Admitting: Nurse Practitioner

## 2024-06-26 ENCOUNTER — Ambulatory Visit: Payer: Self-pay

## 2024-08-17 ENCOUNTER — Ambulatory Visit (HOSPITAL_COMMUNITY)
Admission: EM | Admit: 2024-08-17 | Discharge: 2024-08-17 | Disposition: A | Discharge status: Home / Self Care | Payer: Self-pay | Attending: Physician Assistant | Admitting: Physician Assistant

## 2024-08-17 ENCOUNTER — Encounter (HOSPITAL_COMMUNITY): Payer: Self-pay | Admitting: Emergency Medicine

## 2024-08-17 DIAGNOSIS — R051 Acute cough: Secondary | ICD-10-CM

## 2024-08-17 DIAGNOSIS — I10 Essential (primary) hypertension: Secondary | ICD-10-CM

## 2024-08-17 DIAGNOSIS — J069 Acute upper respiratory infection, unspecified: Secondary | ICD-10-CM

## 2024-08-17 LAB — POC COVID19/FLU A&B COMBO
Covid Antigen, POC: NEGATIVE
Influenza A Antigen, POC: NEGATIVE
Influenza B Antigen, POC: NEGATIVE

## 2024-08-17 MED ORDER — FLUTICASONE PROPIONATE 50 MCG/ACT NA SUSP: 1.0000 | Freq: Every day | NASAL | 0 refills | Status: AC

## 2024-08-17 MED ORDER — PROMETHAZINE-DM 6.25-15 MG/5ML PO SYRP: 5.0000 mL | ORAL_SOLUTION | Freq: Two times a day (BID) | ORAL | 0 refills | Status: DC | PRN

## 2024-08-17 NOTE — Discharge Instructions (Signed)
 You were getting for COVID and flu.  I suspect you have a different virus that is causing your symptoms.  Start Promethazine  DM for cough.  This will make you sleepy do not drive drink alcohol with taking it.  Use fluticasone  nasal spray as well as over-the-counter medicine such as Tylenol , Mucinex , nasal saline/sinus rinses for symptom relief.  Make sure that you rest and drink plenty of fluid.  If you are not feeling better within a week please return for reevaluation.  If anything worsens and you have high fever, worsening cough, shortness of breath, chest pain you need to be seen immediately.  Your blood pressure is elevated.  Please go home and take your medicine.  Monitor blood pressure at home and if this is persistently above 140/90 once you are taking your medicine that you should return here or see her primary care as we can adjust her medicine.  Avoid decongestants, caffeine, sodium, NSAIDs (aspirin, ibuprofen /Advil , naproxen /Aleve ).  If you develop any chest pain, shortness of breath, headache, vision change, dizziness in setting of high blood pressure you need to go to the ER.

## 2024-08-17 NOTE — ED Triage Notes (Signed)
 Pt c/o cough that is productive getting up yellow phlegm. Taking cough medication. Reports when checked BP was high 168/112

## 2024-08-17 NOTE — ED Provider Notes (Signed)
 [facility]    CSN: [id number] Arrival date & time: 08/17/24  8177      History   Chief Complaint Chief Complaint  Patient presents with   Cough    HPI Wesley [name] is a 58 y.o. male.   Patient presents today with a 3-day history of URI symptoms.  He reports nasal congestion, productive cough, sore throat, body aches.  He denies any chest pain, shortness of breath, fever, nausea, vomiting, diarrhea.  Denies any known sick contacts.  He has not had COVID.  He has not had COVID-19 vaccines.  He denies any recent antibiotics or steroids.  He has taken over-the-counter cold and flu medication without improvement of symptoms.  He is having difficulty sleeping at night because of the severity of cough.  Denies any history of allergies, asthma, COPD.  He is a former smoker but quit many years ago.  His blood pressure is elevated.  He has a history of hypertension currently managed with amlodipine  and lisinopril .  He reports having plenty of this medication at home but has not taken in the past several days.  He denies any decongestant use, caffeine, sodium, NSAIDs.  He denies any current symptoms including chest pain, shortness of breath, headache, vision change, dizziness.    Past Medical History:  Diagnosis Date   Alcohol abuse    Anxiety    Anxiety    Arthritis    Chronic neck pain    Cocaine abuse (HCC)    Dizziness    GERD (gastroesophageal reflux disease)    Hypercholesteremia    Hypertension    Prediabetes    Type 2 diabetes mellitus with hyperglycemia, without long-term current use of insulin  (HCC) 01/22/2024    Patient Active Problem List   Diagnosis Date Noted   Impacted cerumen, bilateral 01/22/2024   Numbness of left foot 01/22/2024   Type 2 diabetes mellitus with hyperglycemia, without long-term current use of insulin  (HCC) 01/22/2024   Rash and other nonspecific skin eruption 01/22/2024   Legally blind in right eye, as defined in [place]  05/02/2017    Prediabetes 04/23/2017   Muscle spasm 04/23/2017   Essential hypertension 04/16/2017   H/O medication noncompliance 04/02/2017   Vitamin D  deficiency 04/02/2017   DJD (degenerative joint disease) of cervical spine 03/16/2017   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 Authorizing Provider  fluticasone  (FLONASE ) 50 MCG/ACT nasal spray Place 1 spray into both nostrils daily. 08/17/24  Yes Kathleen Likins K, PA-C  promethazine -dextromethorphan (PROMETHAZINE -DM) 6.25-15 MG/5ML syrup Take 5 mLs by mouth 2 (two) times daily as needed for cough. 08/17/24  Yes Eshaal Duby K, PA-C  amLODipine  (NORVASC ) 5 MG tablet Take 1 tablet (5 mg total) by mouth daily. 04/03/24   Floyd, Dan, DO  lisinopril  (ZESTRIL ) 10 MG tablet Take 1 tablet (10 mg total) by mouth daily. 01/22/24   Paseda, Folashade R, FNP  esomeprazole (NEXIUM) 20 MG capsule Take 20 mg by mouth daily. 02/24/20 10/22/20  [provider]  famotidine  (PEPCID ) 20 MG tablet Take 1 tablet (20 mg total) by mouth 2 (two) times daily. 03/07/20 10/22/20  Christopher Savannah, PA-C  sucralfate  (CARAFATE ) 1 GM/10ML suspension Take 10 mLs (1 g total) by mouth 4 (  four) times daily -  with meals and at bedtime. 12/22/18 03/02/19  Rendell Lorane HERO, PA-C    Family History Family History  Problem Relation Age of Onset   Hypertension Mother    Hypertension Father    Colon cancer Neg Hx    Colon polyps Neg Hx    Esophageal cancer Neg Hx    Rectal cancer Neg Hx    Stomach cancer Neg Hx     Social History Social History   Tobacco Use   Smoking status: Former   Smokeless tobacco: Never  Vaping Use   Vaping status: Never Used  Substance Use Topics   Alcohol use: Yes    Alcohol/week: 8.0 standard drinks of alcohol    Types: 8 Cans of  beer per week    Comment: socially   Drug use: Not Currently     Allergies   Ibuprofen , Other, and Zofran  [ondansetron  hcl]   Review of Systems Review of Systems  Constitutional:  Positive for activity change. Negative for appetite change, fatigue and fever.  HENT:  Positive for congestion and sore throat. Negative for sinus pressure and sneezing.   Eyes:  Negative for visual disturbance.  Respiratory:  Positive for cough. Negative for shortness of breath.   Cardiovascular:  Negative for chest pain, palpitations and leg swelling.  Gastrointestinal:  Negative for diarrhea, nausea and vomiting.  Musculoskeletal:  Positive for arthralgias and myalgias.  Neurological:  Negative for dizziness, light-headedness and headaches.     Physical Exam Triage Vital Signs ED Triage Vitals  Encounter Vitals Group     BP 08/17/24 1848 (!) 163/103     Girls Systolic BP Percentile --      Girls Diastolic BP Percentile --      Boys Systolic BP Percentile --      Boys Diastolic BP Percentile --      Pulse Rate 08/17/24 1848 88     Resp 08/17/24 1848 17     Temp 08/17/24 1848 98.2 F (36.8 C)     Temp Source 08/17/24 1848 Oral     SpO2 08/17/24 1848 98 %     Weight --      Height --      Head Circumference --      Peak Flow --      Pain Score 08/17/24 1846 0     Pain Loc --      Pain Education --      Exclude from Growth Chart --    No data found.  Updated Vital Signs BP (!) 163/103 (BP Location: Right Arm)   Pulse 88   Temp 98.2 F (36.8 C) (Oral)   Resp 17   SpO2 98%   Visual Acuity Right Eye Distance:   Left Eye Distance:   Bilateral Distance:    Right Eye Near:   Left Eye Near:    Bilateral Near:     Physical Exam Vitals reviewed.  Constitutional:      General: He is awake.     Appearance: Normal appearance. He is well-developed. He is not ill-appearing.     Comments: Very pleasant male appears stated age in no acute distress sitting comfortably in exam room   HENT:     Head: Normocephalic and atraumatic.     Right Ear: Tympanic membrane, ear canal and external ear normal. Tympanic membrane is not erythematous or bulging.     Left Ear: Tympanic membrane, ear canal and external ear normal. Tympanic membrane is not erythematous  or bulging.     Nose: Nose normal.     Mouth/Throat:     Dentition: Abnormal dentition.     Pharynx: Uvula midline. Posterior oropharyngeal erythema present. No oropharyngeal exudate or uvula swelling.  Cardiovascular:     Rate and Rhythm: Normal rate and regular rhythm.     Heart sounds: Normal heart sounds, S1 normal and S2 normal. No murmur heard. Pulmonary:     Effort: Pulmonary effort is normal. No accessory muscle usage or respiratory distress.     Breath sounds: Normal breath sounds. No stridor. No wheezing, rhonchi or rales.     Comments: Clear to auscultation bilaterally Neurological:     Mental Status: He is alert.  Psychiatric:        Behavior: Behavior is cooperative.      UC Treatments / Results  Labs (all labs ordered are listed, but only abnormal results are displayed) Labs Reviewed  POC COVID19/FLU A&B COMBO - Normal    EKG   Radiology No results found.  Procedures Procedures (including critical care time)  Medications Ordered in UC Medications - No data to display  Initial Impression / Assessment and Plan / UC Course  I have reviewed the triage vital signs and the nursing notes.  Pertinent labs & imaging results that were available during my care of the patient were reviewed by me and considered in my medical decision making (see chart for details).     Patient is well-appearing, afebrile, nontoxic, nontachycardic.  No evidence of acute infection on physical exam that warrant initiation of antibiotics.  Suspect viral etiology.  COVID and flu testing were negative in clinic.  Chest x-ray was deferred as he had no evidence for his lung sounds on exam and his oxygen saturation was 98%.   Will treat symptomatically and he was given fluticasone  nasal spray as well as Promethazine  DM for cough.  We discussed that he should not drive or drink alcohol taking Promethazine  DM due to associated sedation.  He can use over-the-counter medication including Tylenol , Mucinex , Flonase , nasal saline/sinus rinses.  No indication for dose adjustment of medication based on metabolic panel from 12/11/2022 with a creatinine of 0.86 and calculated creatinine clearance of 123 mL/min.  We discussed that if he is not feeling better within a week or if anything worsens he should be seen for reevaluation.  Strict return precautions given.  Excuse note provided.  Blood pressure is elevated today but patient reports he has not been taking his medication regularly.  We discussed the importance of taking medication regularly and he is to avoid decongestants, caffeine, sodium, NSAIDs.  Recommended that he monitor his blood pressure at home and keep a log for evaluation follow-up appointment.  Encouraged him to follow-up with primary care within the next few weeks for recheck.  We discussed that if develops any chest pain, shortness of breath, headache, vision change, dizziness in setting of high blood pressure he needs to go to the ER.  All questions answered to patient satisfaction.  Final Clinical Impressions(s) / UC Diagnoses   Final diagnoses:  Acute cough  Viral URI with cough  Elevated blood pressure reading with diagnosis of hypertension     Discharge Instructions      You were getting for COVID and flu.  I suspect you have a different virus that is causing your symptoms.  Start Promethazine  DM for cough.  This will make you sleepy do not drive drink alcohol with taking it.  Use fluticasone  nasal spray  as well as over-the-counter medicine such as Tylenol , Mucinex , nasal saline/sinus rinses for symptom relief.  Make sure that you rest and drink plenty of fluid.  If you are not feeling better within a week  please return for reevaluation.  If anything worsens and you have high fever, worsening cough, shortness of breath, chest pain you need to be seen immediately.  Your blood pressure is elevated.  Please go home and take your medicine.  Monitor blood pressure at home and if this is persistently above 140/90 once you are taking your medicine that you should return here or see her primary care as we can adjust her medicine.  Avoid decongestants, caffeine, sodium, NSAIDs (aspirin, ibuprofen /Advil , naproxen /Aleve ).  If you develop any chest pain, shortness of breath, headache, vision change, dizziness in setting of high blood pressure you need to go to the ER.     ED Prescriptions     Medication Sig Dispense Auth. Provider   promethazine -dextromethorphan (PROMETHAZINE -DM) 6.25-15 MG/5ML syrup Take 5 mLs by mouth 2 (two) times daily as needed for cough. 118 mL Omere Marti K, PA-C   fluticasone  (FLONASE ) 50 MCG/ACT nasal spray Place 1 spray into both nostrils daily. 16 g Danicia Terhaar K, PA-C      PDMP not reviewed this encounter.   Sherrell Rocky POUR, PA-C 08/17/24 1939

## 2024-08-20 ENCOUNTER — Encounter (HOSPITAL_COMMUNITY): Payer: Self-pay

## 2024-08-20 ENCOUNTER — Ambulatory Visit (HOSPITAL_COMMUNITY)
Admission: EM | Admit: 2024-08-20 | Discharge: 2024-08-20 | Disposition: A | Discharge status: Home / Self Care | Payer: Self-pay

## 2024-08-20 DIAGNOSIS — I10 Essential (primary) hypertension: Secondary | ICD-10-CM

## 2024-08-20 NOTE — ED Triage Notes (Signed)
 Pt coming in for elevated BP that started 2 days ago. States BP was 185/124, has taken lisinopril  @ 830am, BP is now 144/96. Denies chest pain, SOB, dizziness and headaches.

## 2024-08-20 NOTE — Discharge Instructions (Signed)
°  1. Hypertension, unspecified type (Primary) - Continue taking daily lisinopril  10 mg as directed for primary hypertension. -Elevated blood pressure from this morning may be secondary to viral illness and increased coughing. -Continue to monitor blood pressure for any changes as begin to recover from viral illness blood pressure should normalize. -If blood pressure remains elevated despite improvements to viral symptoms, follow-up in primary care or in urgent care for evaluation and medication management.  -Continue to monitor symptoms for any change in severity if there is any escalation of current symptoms or development of new symptoms follow-up in ER for further evaluation and management.

## 2024-08-20 NOTE — ED Provider Notes (Signed)
 [facility] [place]  Note:  This document was prepared using Conservation officer, historic buildings and may include unintentional dictation errors.  MRN: [id number] DOB: Feb 24, 1966  Subjective:   Wesley [name] is a 58 y.o. male presenting for evaluation of elevated blood pressure this morning.  Patient reports aching his lisinopril  at 830.  Patient's blood pressure is currently 144/96.  Patient denies any chest pain, shortness of breath, this, dizziness, headache last week or so Wesley [name] has had congestion symptoms previously seen here in syndrome.  Patient was prescribed his nasal spray and Promethazine  DM cough syrup to help with symptoms.  Patient reports that Wesley [name] just picked up patient's morning and has not started taking it.  No current facility-administered medications for this encounter.  Current Outpatient Medications:    amLODipine  (NORVASC ) 5 MG tablet, Take 1 tablet (5 mg total) by mouth daily., Disp: 30 tablet, Rfl: 3   fluticasone  (FLONASE ) 50 MCG/ACT nasal spray, Place 1 spray into both nostrils daily., Disp: 16 g, Rfl: 0   lisinopril  (ZESTRIL ) 10 MG tablet, Take 1 tablet (10 mg total) by mouth daily., Disp: 90 tablet, Rfl: 1   promethazine -dextromethorphan (PROMETHAZINE -DM) 6.25-15 MG/5ML syrup, Take 5 mLs by mouth 2 (two) times daily as needed for cough., Disp: 118 mL, Rfl: 0   Allergies  Allergen Reactions   Ibuprofen  Other (See Comments) and Rash    Makes his throat get very dry after taking it States Wesley [name] cant take it   Other Other (See Comments)    Steroids: Patient received a steroid shot from [facility] [id number]) [phone] and it resulted in his muscles twitching all over his body   Zofran  [Ondansetron  Hcl] Other (See Comments)    Causes sweating and body feels badly    Past Medical History:  Diagnosis Date   Alcohol abuse    Anxiety    Anxiety    Arthritis    Chronic neck pain    Cocaine abuse (HCC)    Dizziness    GERD (gastroesophageal reflux  disease)    Hypercholesteremia    Hypertension    Prediabetes    Type 2 diabetes mellitus with hyperglycemia, without long-term current use of insulin  (HCC) 01/22/2024     Past Surgical History:  Procedure Laterality Date   EYE SURGERY Right ~ 1980   hit my eye    Family History  Problem Relation Age of Onset   Hypertension Mother    Hypertension Father    Colon cancer Neg Hx    Colon polyps Neg Hx    Esophageal cancer Neg Hx    Rectal cancer Neg Hx    Stomach cancer Neg Hx     Social History   Tobacco Use   Smoking status: Former   Smokeless tobacco: Never  Vaping Use   Vaping status: Never Used  Substance Use Topics   Alcohol use: Yes    Alcohol/week: 8.0 standard drinks of alcohol    Types: 8 Cans of beer per week    Comment: socially   Drug use: Not Currently    ROS Refer to HPI for ROS details.  Objective:    Vitals: BP (!) 144/96 (BP Location: Left Arm)   Pulse 71   Temp 97.7 F (36.5 C) (Oral)   Resp 17   SpO2 97%   Physical Exam Vitals and nursing note reviewed.  Constitutional:      General: Wesley [name] is not in acute distress.    Appearance: Normal appearance. Wesley [name] is well-developed. Wesley [name]  is not ill-appearing or toxic-appearing.  HENT:     Head: Normocephalic.     Nose: Congestion present.     Mouth/Throat:     Mouth: Mucous membranes are moist.     Pharynx: Oropharynx is clear.  Cardiovascular:     Rate and Rhythm: Normal rate and regular rhythm.     Heart sounds: Normal heart sounds. No murmur heard. Pulmonary:     Effort: Pulmonary effort is normal. No respiratory distress.     Breath sounds: Normal breath sounds. No stridor. No wheezing, rhonchi or rales.  Chest:     Chest wall: No tenderness.  Skin:    General: Skin is warm and dry.  Neurological:     General: No focal deficit present.     Mental Status: Wesley [name] is alert and oriented to person, place, and time.  Psychiatric:        Mood and Affect: Mood normal.        Behavior: Behavior  normal.     Procedures  No results found for this or any previous visit (from the past 24 hours).  Assessment and Plan :     Discharge Instructions       1. Hypertension, unspecified type (Primary) - Continue taking daily lisinopril  10 mg as directed for primary hypertension. -Elevated blood pressure from this morning may be secondary to viral illness and increased coughing. -Continue to monitor blood pressure for any changes as begin to recover from viral illness blood pressure should normalize. -If blood pressure remains elevated despite improvements to viral symptoms, follow-up in primary care or in urgent care for evaluation and medication management.  -Continue to monitor symptoms for any change in severity if there is any escalation of current symptoms or development of new symptoms follow-up in ER for further evaluation and management.      Janele Lague B Ladanian Kelter   Everlina Gotts, Berkey B, NP 08/20/24 1106

## 2024-10-04 ENCOUNTER — Ambulatory Visit (HOSPITAL_COMMUNITY)
Admission: EM | Admit: 2024-10-04 | Discharge: 2024-10-04 | Disposition: A | Discharge status: Home / Self Care | Payer: Self-pay | Attending: Family Medicine | Admitting: Family Medicine

## 2024-10-04 ENCOUNTER — Encounter (HOSPITAL_COMMUNITY): Payer: Self-pay

## 2024-10-04 DIAGNOSIS — J069 Acute upper respiratory infection, unspecified: Secondary | ICD-10-CM

## 2024-10-04 LAB — POC COVID19/FLU A&B COMBO
Covid Antigen, POC: NEGATIVE
Influenza A Antigen, POC: NEGATIVE
Influenza B Antigen, POC: NEGATIVE

## 2024-10-04 MED ORDER — BENZONATATE 100 MG PO CAPS: 100.0000 mg | ORAL_CAPSULE | Freq: Three times a day (TID) | ORAL | 0 refills | Status: AC | PRN

## 2024-10-04 MED ORDER — KETOROLAC TROMETHAMINE 30 MG/ML IJ SOLN
INTRAMUSCULAR | Status: AC
  Filled 2024-10-04: qty 1

## 2024-10-04 MED ORDER — KETOROLAC TROMETHAMINE 30 MG/ML IJ SOLN
30.0000 mg | Freq: Once | INTRAMUSCULAR | Status: AC
  Administered 2024-10-04: 30 mg via INTRAMUSCULAR

## 2024-10-04 NOTE — Discharge Instructions (Addendum)
 Testing for flu and COVID is negative  You have been given a shot of Toradol  30 mg today.  Take benzonatate  100 mg, 1 tab every 8 hours as needed for cough.  At home continue to take Tylenol  500 mg --2 tablets every 6 hours as needed for pain or fever  (Las pruebas de gripe y COVID dieron negativo.  Hoy le administraron una inyeccin de Toradol  de 30 mg.  Tome benzonatato de 100 mg, 1 comprimido cada 8 horas segn sea necesario para la tos.  En casa, contine tomando Tylenol  de 500 mg (2 comprimidos cada 6 horas) segn sea necesario para el dolor o la fiebre.)

## 2024-10-04 NOTE — ED Triage Notes (Signed)
 Dry cough, body aches, sinus pain onset yesterday.   Patient tried ibuprofen  and otc cough meds with little relief.

## 2024-10-04 NOTE — ED Provider Notes (Signed)
 " [facility]    CSN: [id number] Arrival date & time: 10/04/24  1131      History   Chief Complaint Chief Complaint  Patient presents with   Cough    HPI Wesley [name] is a 59 y.o. male.    Cough  Here for cough that is dry and some nasal congestion and rhinorrhea.  He has had chills but has not documented fever.  No nausea or vomiting or diarrhea.  He is aching all over.  He is allergic to ibuprofen --it makes his throat get dry, but does not cause any throat swelling.  He states steroids causes muscles to twitch and he cannot take Zofran  because it makes his body sweat  Past medical history includes either prediabetes or diabetes.  His last A1c done in the system was in 2019 and was 6.2.  He has received Toradol  injections in the past without ill effect Last eGFR was greater than 60     Past Medical History:  Diagnosis Date   Alcohol abuse    Anxiety    Anxiety    Arthritis    Chronic neck pain    Cocaine abuse (HCC)    Dizziness    GERD (gastroesophageal reflux disease)    Hypercholesteremia    Hypertension    Prediabetes    Type 2 diabetes mellitus with hyperglycemia, without long-term current use of insulin  (HCC) 01/22/2024    Patient Active Problem List   Diagnosis Date Noted   Impacted cerumen, bilateral 01/22/2024   Numbness of left foot 01/22/2024   Type 2 diabetes mellitus with hyperglycemia, without long-term current use of insulin  (HCC) 01/22/2024   Rash and other nonspecific skin eruption 01/22/2024   Legally blind in right eye, as defined in [place]  05/02/2017   Prediabetes 04/23/2017   Muscle spasm 04/23/2017   Essential hypertension 04/16/2017   H/O medication noncompliance 04/02/2017   Vitamin D  deficiency 04/02/2017   DJD (degenerative joint disease) of cervical spine 03/16/2017   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 Authorizing Provider  benzonatate  (TESSALON ) 100 MG capsule Take 1 capsule (100 mg total) by mouth 3 (three) times daily as needed for cough. 10/04/24  Yes Vonna Sharlet POUR, MD  lisinopril  (ZESTRIL ) 10 MG tablet Take 1 tablet (10 mg total) by mouth daily. 01/22/24  Yes Paseda, Folashade R, FNP  fluticasone  (FLONASE ) 50 MCG/ACT nasal spray Place 1 spray into both nostrils daily. 08/17/24   Raspet, Erin K, PA-C  esomeprazole (NEXIUM) 20 MG capsule Take 20 mg by mouth daily. 02/24/20 10/22/20  [provider]  famotidine  (PEPCID ) 20 MG tablet Take 1 tablet (20 mg total) by mouth 2 (two) times daily. 03/07/20 10/22/20  Christopher Savannah, PA-C  sucralfate  (CARAFATE ) 1 GM/10ML suspension Take 10 mLs (1 g total) by mouth 4 (four) times daily -  with meals and at bedtime. 12/22/18 03/02/19  Rendell Lorane HERO, PA-C    Family History Family History  Problem Relation Age of Onset   Hypertension Mother    Hypertension Father    Colon cancer Neg Hx    Colon polyps Neg Hx    Esophageal cancer Neg Hx    Rectal cancer Neg Hx    Stomach  cancer Neg Hx     Social History Social History[1]   Allergies   Ibuprofen , Other, and Zofran  [ondansetron  hcl]   Review of Systems Review of Systems  Respiratory:  Positive for cough.      Physical Exam Triage Vital Signs ED Triage Vitals  Encounter Vitals Group     BP 10/04/24 1237 (!) 170/102     Girls Systolic BP Percentile --      Girls Diastolic BP Percentile --      Boys Systolic BP Percentile --      Boys Diastolic BP Percentile --      Pulse Rate 10/04/24 1237 (!) 104     Resp 10/04/24 1237 18     Temp 10/04/24 1237 99 F (37.2 C)     Temp Source 10/04/24 1237 Oral     SpO2 10/04/24 1237 97 %     Weight --      Height --      Head Circumference --      Peak Flow --      Pain Score 10/04/24 1236 9     Pain Loc --       Pain Education --      Exclude from Growth Chart --    No data found.  Updated Vital Signs BP (!) 170/102 (BP Location: Left Arm)   Pulse (!) 104   Temp 99 F (37.2 C) (Oral)   Resp 18   SpO2 97%   Visual Acuity Right Eye Distance:   Left Eye Distance:   Bilateral Distance:    Right Eye Near:   Left Eye Near:    Bilateral Near:     Physical Exam Vitals reviewed.  Constitutional:      General: He is not in acute distress.    Appearance: He is not ill-appearing, toxic-appearing or diaphoretic.  HENT:     Right Ear: Tympanic membrane and ear canal normal.     Left Ear: Tympanic membrane and ear canal normal.     Nose: Congestion present.     Mouth/Throat:     Mouth: Mucous membranes are moist.     Pharynx: No oropharyngeal exudate or posterior oropharyngeal erythema.  Eyes:     Extraocular Movements: Extraocular movements intact.     Conjunctiva/sclera: Conjunctivae normal.     Pupils: Pupils are equal, round, and reactive to light.  Cardiovascular:     Rate and Rhythm: Normal rate and regular rhythm.     Heart sounds: No murmur heard. Pulmonary:     Effort: Pulmonary effort is normal. No respiratory distress.     Breath sounds: Normal breath sounds. No stridor. No wheezing, rhonchi or rales.  Musculoskeletal:     Cervical back: Neck supple.  Lymphadenopathy:     Cervical: No cervical adenopathy.  Skin:    Capillary Refill: Capillary refill takes less than 2 seconds.     Coloration: Skin is not jaundiced or pale.  Neurological:     General: No focal deficit present.     Mental Status: He is alert and oriented to person, place, and time.  Psychiatric:        Behavior: Behavior normal.      UC Treatments / Results  Labs (all labs ordered are listed, but only abnormal results are displayed) Labs Reviewed  POC COVID19/FLU A&B COMBO    EKG   Radiology No results found.  Procedures Procedures (including critical care time)  Medications Ordered  in UC Medications  ketorolac  (TORADOL ) 30  MG/ML injection 30 mg (has no administration in time range)    Initial Impression / Assessment and Plan / UC Course  I have reviewed the triage vital signs and the nursing notes.  Pertinent labs & imaging results that were available during my care of the patient were reviewed by me and considered in my medical decision making (see chart for details).     COVID and flu testing is negative  Toradol  injection was given here and have asked him to continue taking Tylenol  as needed at home.  Tessalon  Perles were sent in for cough  Visit is conducted in Spanish Final Clinical Impressions(s) / UC Diagnoses   Final diagnoses:  Viral URI     Discharge Instructions      Testing for flu and COVID is negative  You have been given a shot of Toradol  30 mg today.  Take benzonatate  100 mg, 1 tab every 8 hours as needed for cough.  At home continue to take Tylenol  as needed for pain or fever     ED Prescriptions     Medication Sig Dispense Auth. Provider   benzonatate  (TESSALON ) 100 MG capsule Take 1 capsule (100 mg total) by mouth 3 (three) times daily as needed for cough. 21 capsule Thimothy Barretta K, MD      PDMP not reviewed this encounter.    [1]  Social History Tobacco Use   Smoking status: Former   Smokeless tobacco: Never  Vaping Use   Vaping status: Never Used  Substance Use Topics   Alcohol use: Yes    Alcohol/week: 8.0 standard drinks of alcohol    Types: 8 Cans of beer per week    Comment: socially   Drug use: Not Currently     Vonna Sharlet POUR, MD 10/04/24 1342  "
